# Patient Record
Sex: Female | Born: 1975 | Race: Black or African American | Hispanic: No | Marital: Single | Smoking: Former smoker
Health system: Southern US, Community
[De-identification: ages and names within clinical notes are randomized; demographics above are authoritative.]

## PROBLEM LIST (undated history)

## (undated) DIAGNOSIS — Z800 Family history of malignant neoplasm of digestive organs: Secondary | ICD-10-CM

## (undated) DIAGNOSIS — I10 Essential (primary) hypertension: Secondary | ICD-10-CM

## (undated) DIAGNOSIS — F3289 Other specified depressive episodes: Secondary | ICD-10-CM

## (undated) DIAGNOSIS — F419 Anxiety disorder, unspecified: Secondary | ICD-10-CM

## (undated) DIAGNOSIS — Z801 Family history of malignant neoplasm of trachea, bronchus and lung: Secondary | ICD-10-CM

## (undated) DIAGNOSIS — M1990 Unspecified osteoarthritis, unspecified site: Secondary | ICD-10-CM

## (undated) DIAGNOSIS — M542 Cervicalgia: Secondary | ICD-10-CM

## (undated) DIAGNOSIS — F329 Major depressive disorder, single episode, unspecified: Secondary | ICD-10-CM

## (undated) DIAGNOSIS — G43909 Migraine, unspecified, not intractable, without status migrainosus: Secondary | ICD-10-CM

## (undated) DIAGNOSIS — T7840XA Allergy, unspecified, initial encounter: Secondary | ICD-10-CM

## (undated) DIAGNOSIS — D649 Anemia, unspecified: Secondary | ICD-10-CM

## (undated) DIAGNOSIS — J45909 Unspecified asthma, uncomplicated: Secondary | ICD-10-CM

## (undated) DIAGNOSIS — N760 Acute vaginitis: Secondary | ICD-10-CM

## (undated) DIAGNOSIS — Z803 Family history of malignant neoplasm of breast: Secondary | ICD-10-CM

## (undated) DIAGNOSIS — O039 Complete or unspecified spontaneous abortion without complication: Secondary | ICD-10-CM

## (undated) HISTORY — DX: Allergy, unspecified, initial encounter: T78.40XA

## (undated) HISTORY — DX: Family history of malignant neoplasm of digestive organs: Z80.0

## (undated) HISTORY — DX: Complete or unspecified spontaneous abortion without complication: O03.9

## (undated) HISTORY — DX: Cervicalgia: M54.2

## (undated) HISTORY — DX: Acute vaginitis: N76.0

## (undated) HISTORY — DX: Migraine, unspecified, not intractable, without status migrainosus: G43.909

## (undated) HISTORY — DX: Unspecified osteoarthritis, unspecified site: M19.90

## (undated) HISTORY — DX: Family history of malignant neoplasm of breast: Z80.3

## (undated) HISTORY — DX: Family history of malignant neoplasm of trachea, bronchus and lung: Z80.1

## (undated) HISTORY — DX: Unspecified asthma, uncomplicated: J45.909

## (undated) HISTORY — DX: Major depressive disorder, single episode, unspecified: F32.9

## (undated) HISTORY — DX: Other specified depressive episodes: F32.89

---

## 1997-05-01 HISTORY — PX: OTHER SURGICAL HISTORY: SHX169

## 2001-02-19 ENCOUNTER — Emergency Department (HOSPITAL_COMMUNITY): Admission: EM | Admit: 2001-02-19 | Discharge: 2001-02-19 | Payer: Self-pay | Admitting: Emergency Medicine

## 2001-05-03 ENCOUNTER — Emergency Department (HOSPITAL_COMMUNITY): Admission: EM | Admit: 2001-05-03 | Discharge: 2001-05-03 | Payer: Self-pay | Admitting: Emergency Medicine

## 2002-03-31 ENCOUNTER — Emergency Department (HOSPITAL_COMMUNITY): Admission: EM | Admit: 2002-03-31 | Discharge: 2002-03-31 | Payer: Self-pay | Admitting: Emergency Medicine

## 2002-05-13 ENCOUNTER — Emergency Department (HOSPITAL_COMMUNITY): Admission: EM | Admit: 2002-05-13 | Discharge: 2002-05-13 | Payer: Self-pay | Admitting: Emergency Medicine

## 2002-07-29 ENCOUNTER — Emergency Department (HOSPITAL_COMMUNITY): Admission: EM | Admit: 2002-07-29 | Discharge: 2002-07-29 | Payer: Self-pay | Admitting: Emergency Medicine

## 2002-11-10 ENCOUNTER — Emergency Department (HOSPITAL_COMMUNITY): Admission: EM | Admit: 2002-11-10 | Discharge: 2002-11-10 | Payer: Self-pay | Admitting: *Deleted

## 2002-12-17 ENCOUNTER — Emergency Department (HOSPITAL_COMMUNITY): Admission: EM | Admit: 2002-12-17 | Discharge: 2002-12-17 | Payer: Self-pay

## 2003-01-09 ENCOUNTER — Emergency Department (HOSPITAL_COMMUNITY): Admission: EM | Admit: 2003-01-09 | Discharge: 2003-01-09 | Payer: Self-pay | Admitting: Emergency Medicine

## 2003-02-24 ENCOUNTER — Emergency Department (HOSPITAL_COMMUNITY): Admission: EM | Admit: 2003-02-24 | Discharge: 2003-02-24 | Payer: Self-pay | Admitting: Emergency Medicine

## 2003-06-20 ENCOUNTER — Emergency Department (HOSPITAL_COMMUNITY): Admission: EM | Admit: 2003-06-20 | Discharge: 2003-06-20 | Payer: Self-pay | Admitting: Emergency Medicine

## 2003-12-09 ENCOUNTER — Emergency Department (HOSPITAL_COMMUNITY): Admission: EM | Admit: 2003-12-09 | Discharge: 2003-12-09 | Payer: Self-pay | Admitting: Emergency Medicine

## 2003-12-10 ENCOUNTER — Ambulatory Visit (HOSPITAL_COMMUNITY): Admission: RE | Admit: 2003-12-10 | Discharge: 2003-12-10 | Payer: Self-pay | Admitting: Family Medicine

## 2004-10-25 ENCOUNTER — Emergency Department (HOSPITAL_COMMUNITY): Admission: EM | Admit: 2004-10-25 | Discharge: 2004-10-25 | Payer: Self-pay | Admitting: Emergency Medicine

## 2005-04-04 ENCOUNTER — Ambulatory Visit: Payer: Self-pay | Admitting: Family Medicine

## 2005-04-04 ENCOUNTER — Encounter (INDEPENDENT_AMBULATORY_CARE_PROVIDER_SITE_OTHER): Payer: Self-pay | Admitting: *Deleted

## 2005-04-04 LAB — CONVERTED CEMR LAB: Pap Smear: ABNORMAL

## 2006-01-03 ENCOUNTER — Emergency Department (HOSPITAL_COMMUNITY): Admission: EM | Admit: 2006-01-03 | Discharge: 2006-01-03 | Payer: Self-pay | Admitting: Emergency Medicine

## 2006-02-05 ENCOUNTER — Emergency Department (HOSPITAL_COMMUNITY): Admission: EM | Admit: 2006-02-05 | Discharge: 2006-02-05 | Payer: Self-pay | Admitting: Emergency Medicine

## 2006-04-17 ENCOUNTER — Emergency Department (HOSPITAL_COMMUNITY): Admission: EM | Admit: 2006-04-17 | Discharge: 2006-04-18 | Payer: Self-pay | Admitting: Emergency Medicine

## 2006-08-30 ENCOUNTER — Ambulatory Visit: Payer: Self-pay | Admitting: Family Medicine

## 2006-08-31 ENCOUNTER — Ambulatory Visit (HOSPITAL_COMMUNITY): Admission: RE | Admit: 2006-08-31 | Discharge: 2006-08-31 | Payer: Self-pay | Admitting: Family Medicine

## 2006-09-27 ENCOUNTER — Other Ambulatory Visit: Admission: RE | Admit: 2006-09-27 | Discharge: 2006-09-27 | Payer: Self-pay | Admitting: Family Medicine

## 2006-09-27 ENCOUNTER — Ambulatory Visit: Payer: Self-pay | Admitting: Family Medicine

## 2006-09-27 ENCOUNTER — Encounter (INDEPENDENT_AMBULATORY_CARE_PROVIDER_SITE_OTHER): Payer: Self-pay | Admitting: *Deleted

## 2006-09-28 ENCOUNTER — Encounter: Payer: Self-pay | Admitting: Family Medicine

## 2006-09-28 LAB — CONVERTED CEMR LAB
Candida species: NEGATIVE
Chlamydia, DNA Probe: NEGATIVE
GC Probe Amp, Genital: NEGATIVE
Gardnerella vaginalis: POSITIVE — AB
Trichomonal Vaginitis: NEGATIVE

## 2006-11-19 ENCOUNTER — Ambulatory Visit: Payer: Self-pay | Admitting: Family Medicine

## 2006-11-23 ENCOUNTER — Ambulatory Visit (HOSPITAL_COMMUNITY): Admission: RE | Admit: 2006-11-23 | Discharge: 2006-11-23 | Payer: Self-pay | Admitting: Family Medicine

## 2006-11-27 ENCOUNTER — Encounter (HOSPITAL_COMMUNITY): Admission: RE | Admit: 2006-11-27 | Discharge: 2006-12-27 | Payer: Self-pay | Admitting: Family Medicine

## 2006-11-27 ENCOUNTER — Emergency Department (HOSPITAL_COMMUNITY): Admission: EM | Admit: 2006-11-27 | Discharge: 2006-11-27 | Payer: Self-pay | Admitting: Emergency Medicine

## 2006-12-03 ENCOUNTER — Ambulatory Visit: Payer: Self-pay | Admitting: Orthopedic Surgery

## 2006-12-24 ENCOUNTER — Ambulatory Visit: Payer: Self-pay | Admitting: Orthopedic Surgery

## 2007-01-03 ENCOUNTER — Ambulatory Visit: Payer: Self-pay | Admitting: Orthopedic Surgery

## 2007-01-17 ENCOUNTER — Ambulatory Visit: Payer: Self-pay | Admitting: Orthopedic Surgery

## 2007-03-06 ENCOUNTER — Ambulatory Visit: Payer: Self-pay | Admitting: Family Medicine

## 2007-03-07 ENCOUNTER — Encounter: Payer: Self-pay | Admitting: Family Medicine

## 2007-03-08 ENCOUNTER — Encounter: Payer: Self-pay | Admitting: Family Medicine

## 2007-03-08 LAB — CONVERTED CEMR LAB: Gardnerella vaginalis: POSITIVE — AB

## 2007-05-02 DIAGNOSIS — O039 Complete or unspecified spontaneous abortion without complication: Secondary | ICD-10-CM

## 2007-05-02 HISTORY — DX: Complete or unspecified spontaneous abortion without complication: O03.9

## 2007-06-05 ENCOUNTER — Ambulatory Visit: Payer: Self-pay | Admitting: Family Medicine

## 2007-06-05 LAB — CONVERTED CEMR LAB
BUN: 11 mg/dL (ref 6–23)
Basophils Relative: 1 % (ref 0–1)
CO2: 23 meq/L (ref 19–32)
Chloride: 107 meq/L (ref 96–112)
Creatinine, Ser: 0.75 mg/dL (ref 0.40–1.20)
GC Probe Amp, Genital: NEGATIVE
Hemoglobin: 9.3 g/dL — ABNORMAL LOW (ref 12.0–15.0)
LDL Cholesterol: 116 mg/dL — ABNORMAL HIGH (ref 0–99)
Lymphocytes Relative: 27 % (ref 12–46)
Lymphs Abs: 1.3 10*3/uL (ref 0.7–4.0)
Monocytes Absolute: 0.4 10*3/uL (ref 0.1–1.0)
Monocytes Relative: 8 % (ref 3–12)
Neutro Abs: 2.9 10*3/uL (ref 1.7–7.7)
Neutrophils Relative %: 62 % (ref 43–77)
Potassium: 4.6 meq/L (ref 3.5–5.3)
RBC: 4.49 M/uL (ref 3.87–5.11)
TSH: 2.879 microintl units/mL (ref 0.350–5.50)
Triglycerides: 58 mg/dL (ref <150)
VLDL: 12 mg/dL (ref 0–40)
WBC: 4.8 10*3/uL (ref 4.0–10.5)

## 2007-06-06 ENCOUNTER — Encounter: Payer: Self-pay | Admitting: Family Medicine

## 2007-06-06 LAB — CONVERTED CEMR LAB
Candida species: NEGATIVE
Gardnerella vaginalis: POSITIVE — AB
Retic Ct Pct: 1.3 % (ref 0.4–3.1)
Trichomonal Vaginitis: NEGATIVE

## 2007-07-04 ENCOUNTER — Encounter (INDEPENDENT_AMBULATORY_CARE_PROVIDER_SITE_OTHER): Payer: Self-pay | Admitting: *Deleted

## 2007-07-04 DIAGNOSIS — F329 Major depressive disorder, single episode, unspecified: Secondary | ICD-10-CM | POA: Insufficient documentation

## 2007-07-04 DIAGNOSIS — M542 Cervicalgia: Secondary | ICD-10-CM | POA: Insufficient documentation

## 2007-07-10 ENCOUNTER — Ambulatory Visit: Payer: Self-pay | Admitting: Family Medicine

## 2007-07-11 ENCOUNTER — Encounter: Payer: Self-pay | Admitting: Family Medicine

## 2007-07-11 LAB — CONVERTED CEMR LAB: Candida species: NEGATIVE

## 2007-08-30 ENCOUNTER — Ambulatory Visit: Payer: Self-pay | Admitting: Family Medicine

## 2007-09-02 ENCOUNTER — Encounter: Payer: Self-pay | Admitting: Family Medicine

## 2007-09-02 LAB — CONVERTED CEMR LAB
Chlamydia, DNA Probe: NEGATIVE
GC Probe Amp, Genital: NEGATIVE

## 2007-09-03 ENCOUNTER — Encounter: Payer: Self-pay | Admitting: Family Medicine

## 2007-09-03 LAB — CONVERTED CEMR LAB
Gardnerella vaginalis: POSITIVE — AB
Trichomonal Vaginitis: NEGATIVE

## 2007-11-07 ENCOUNTER — Ambulatory Visit: Payer: Self-pay | Admitting: Family Medicine

## 2007-11-08 ENCOUNTER — Ambulatory Visit (HOSPITAL_COMMUNITY): Admission: RE | Admit: 2007-11-08 | Discharge: 2007-11-08 | Payer: Self-pay | Admitting: *Deleted

## 2007-11-08 ENCOUNTER — Encounter: Payer: Self-pay | Admitting: Family Medicine

## 2007-11-08 LAB — CONVERTED CEMR LAB: Trichomonal Vaginitis: NEGATIVE

## 2007-11-18 ENCOUNTER — Ambulatory Visit: Payer: Self-pay | Admitting: Cardiology

## 2007-11-27 ENCOUNTER — Ambulatory Visit (HOSPITAL_COMMUNITY): Admission: RE | Admit: 2007-11-27 | Discharge: 2007-11-27 | Payer: Self-pay | Admitting: *Deleted

## 2007-12-04 ENCOUNTER — Emergency Department (HOSPITAL_COMMUNITY): Admission: EM | Admit: 2007-12-04 | Discharge: 2007-12-04 | Payer: Self-pay | Admitting: Emergency Medicine

## 2007-12-19 ENCOUNTER — Encounter: Payer: Self-pay | Admitting: Family Medicine

## 2008-01-24 ENCOUNTER — Telehealth: Payer: Self-pay | Admitting: Family Medicine

## 2008-02-12 ENCOUNTER — Ambulatory Visit: Payer: Self-pay | Admitting: Family Medicine

## 2008-02-12 DIAGNOSIS — N912 Amenorrhea, unspecified: Secondary | ICD-10-CM | POA: Insufficient documentation

## 2008-02-12 DIAGNOSIS — N3000 Acute cystitis without hematuria: Secondary | ICD-10-CM | POA: Insufficient documentation

## 2008-02-12 DIAGNOSIS — N760 Acute vaginitis: Secondary | ICD-10-CM | POA: Insufficient documentation

## 2008-02-12 LAB — CONVERTED CEMR LAB
Beta hcg, urine, semiquantitative: NEGATIVE
Bilirubin Urine: NEGATIVE
Glucose, Urine, Semiquant: NEGATIVE
Protein, U semiquant: NEGATIVE
Specific Gravity, Urine: 1.015

## 2008-02-13 ENCOUNTER — Encounter: Payer: Self-pay | Admitting: Family Medicine

## 2008-02-13 ENCOUNTER — Ambulatory Visit (HOSPITAL_COMMUNITY): Admission: RE | Admit: 2008-02-13 | Discharge: 2008-02-13 | Payer: Self-pay | Admitting: Family Medicine

## 2008-02-13 LAB — CONVERTED CEMR LAB: Gardnerella vaginalis: POSITIVE — AB

## 2008-02-15 DIAGNOSIS — M79609 Pain in unspecified limb: Secondary | ICD-10-CM | POA: Insufficient documentation

## 2008-02-18 ENCOUNTER — Telehealth: Payer: Self-pay | Admitting: Family Medicine

## 2008-02-20 ENCOUNTER — Telehealth: Payer: Self-pay | Admitting: Family Medicine

## 2008-04-02 ENCOUNTER — Ambulatory Visit: Payer: Self-pay | Admitting: Family Medicine

## 2008-04-02 ENCOUNTER — Other Ambulatory Visit: Admission: RE | Admit: 2008-04-02 | Discharge: 2008-04-02 | Payer: Self-pay | Admitting: Family Medicine

## 2008-04-02 ENCOUNTER — Encounter: Payer: Self-pay | Admitting: Family Medicine

## 2008-04-03 ENCOUNTER — Encounter: Payer: Self-pay | Admitting: Family Medicine

## 2008-04-03 LAB — CONVERTED CEMR LAB
Chlamydia, DNA Probe: NEGATIVE
GC Probe Amp, Genital: NEGATIVE

## 2008-04-06 ENCOUNTER — Encounter: Payer: Self-pay | Admitting: Family Medicine

## 2008-04-07 LAB — CONVERTED CEMR LAB
Candida species: NEGATIVE
Retic Ct Pct: 1.7 % (ref 0.4–3.1)
Trichomonal Vaginitis: NEGATIVE

## 2008-04-13 ENCOUNTER — Telehealth: Payer: Self-pay | Admitting: Family Medicine

## 2008-04-13 ENCOUNTER — Encounter (HOSPITAL_COMMUNITY): Admission: RE | Admit: 2008-04-13 | Discharge: 2008-04-30 | Payer: Self-pay | Admitting: Family Medicine

## 2008-05-05 ENCOUNTER — Encounter (HOSPITAL_COMMUNITY): Admission: RE | Admit: 2008-05-05 | Discharge: 2008-06-04 | Payer: Self-pay | Admitting: Family Medicine

## 2008-05-12 ENCOUNTER — Encounter: Payer: Self-pay | Admitting: Family Medicine

## 2008-06-17 ENCOUNTER — Ambulatory Visit: Payer: Self-pay | Admitting: Family Medicine

## 2008-06-18 ENCOUNTER — Ambulatory Visit (HOSPITAL_COMMUNITY): Admission: RE | Admit: 2008-06-18 | Discharge: 2008-06-18 | Payer: Self-pay | Admitting: Family Medicine

## 2008-07-07 ENCOUNTER — Ambulatory Visit (HOSPITAL_COMMUNITY): Payer: Self-pay | Admitting: Psychiatry

## 2008-07-10 ENCOUNTER — Encounter: Payer: Self-pay | Admitting: Family Medicine

## 2008-08-31 ENCOUNTER — Ambulatory Visit: Payer: Self-pay | Admitting: Family Medicine

## 2008-08-31 DIAGNOSIS — R109 Unspecified abdominal pain: Secondary | ICD-10-CM | POA: Insufficient documentation

## 2008-08-31 DIAGNOSIS — F319 Bipolar disorder, unspecified: Secondary | ICD-10-CM | POA: Insufficient documentation

## 2008-09-01 ENCOUNTER — Encounter: Payer: Self-pay | Admitting: Family Medicine

## 2008-09-01 LAB — CONVERTED CEMR LAB
Basophils Absolute: 0 10*3/uL (ref 0.0–0.1)
Basophils Relative: 0 % (ref 0–1)
Calcium: 9.4 mg/dL (ref 8.4–10.5)
Cholesterol: 175 mg/dL (ref 0–200)
HDL: 45 mg/dL (ref >39)
MCHC: 32.4 g/dL (ref 30.0–36.0)
Neutro Abs: 4.3 10*3/uL (ref 1.7–7.7)
Neutrophils Relative %: 68 % (ref 43–77)
Platelets: 175 10*3/uL (ref 150–400)
RDW: 18 % — ABNORMAL HIGH (ref 11.5–15.5)
Sodium: 139 meq/L (ref 135–145)
Total CHOL/HDL Ratio: 3.9

## 2008-09-02 LAB — CONVERTED CEMR LAB: Retic Ct Pct: 1.4 % (ref 0.4–3.1)

## 2008-09-04 ENCOUNTER — Ambulatory Visit (HOSPITAL_COMMUNITY): Admission: RE | Admit: 2008-09-04 | Discharge: 2008-09-04 | Payer: Self-pay | Admitting: Family Medicine

## 2008-09-22 ENCOUNTER — Telehealth: Payer: Self-pay | Admitting: Family Medicine

## 2008-10-15 ENCOUNTER — Ambulatory Visit: Payer: Self-pay | Admitting: Family Medicine

## 2008-10-17 ENCOUNTER — Encounter: Payer: Self-pay | Admitting: Family Medicine

## 2008-10-17 LAB — CONVERTED CEMR LAB: GC Probe Amp, Genital: NEGATIVE

## 2008-10-21 ENCOUNTER — Encounter: Payer: Self-pay | Admitting: Family Medicine

## 2008-10-21 ENCOUNTER — Telehealth: Payer: Self-pay | Admitting: Family Medicine

## 2008-10-21 LAB — CONVERTED CEMR LAB
Candida species: NEGATIVE
Trichomonal Vaginitis: NEGATIVE

## 2008-12-01 ENCOUNTER — Ambulatory Visit: Payer: Self-pay | Admitting: Family Medicine

## 2008-12-01 DIAGNOSIS — J309 Allergic rhinitis, unspecified: Secondary | ICD-10-CM | POA: Insufficient documentation

## 2008-12-02 ENCOUNTER — Encounter: Payer: Self-pay | Admitting: Family Medicine

## 2008-12-02 LAB — CONVERTED CEMR LAB
Chlamydia, DNA Probe: NEGATIVE
GC Probe Amp, Genital: NEGATIVE

## 2009-01-07 ENCOUNTER — Emergency Department (HOSPITAL_COMMUNITY): Admission: EM | Admit: 2009-01-07 | Discharge: 2009-01-07 | Payer: Self-pay | Admitting: Emergency Medicine

## 2009-03-10 ENCOUNTER — Ambulatory Visit: Payer: Self-pay | Admitting: Family Medicine

## 2009-03-10 DIAGNOSIS — IMO0002 Reserved for concepts with insufficient information to code with codable children: Secondary | ICD-10-CM | POA: Insufficient documentation

## 2009-03-11 ENCOUNTER — Encounter: Payer: Self-pay | Admitting: Family Medicine

## 2009-03-11 LAB — CONVERTED CEMR LAB
GC Probe Amp, Genital: NEGATIVE
Gardnerella vaginalis: POSITIVE — AB

## 2009-04-12 ENCOUNTER — Other Ambulatory Visit: Admission: RE | Admit: 2009-04-12 | Discharge: 2009-04-12 | Payer: Self-pay | Admitting: Family Medicine

## 2009-04-12 ENCOUNTER — Ambulatory Visit: Payer: Self-pay | Admitting: Family Medicine

## 2009-04-12 DIAGNOSIS — R519 Headache, unspecified: Secondary | ICD-10-CM | POA: Insufficient documentation

## 2009-04-12 DIAGNOSIS — R51 Headache: Secondary | ICD-10-CM | POA: Insufficient documentation

## 2009-04-13 ENCOUNTER — Encounter: Payer: Self-pay | Admitting: Family Medicine

## 2009-04-14 ENCOUNTER — Telehealth: Payer: Self-pay | Admitting: Family Medicine

## 2009-04-14 LAB — CONVERTED CEMR LAB
Candida species: NEGATIVE
Gardnerella vaginalis: POSITIVE — AB

## 2009-04-27 ENCOUNTER — Ambulatory Visit: Payer: Self-pay | Admitting: Family Medicine

## 2009-04-27 DIAGNOSIS — J0100 Acute maxillary sinusitis, unspecified: Secondary | ICD-10-CM | POA: Insufficient documentation

## 2009-04-28 ENCOUNTER — Ambulatory Visit (HOSPITAL_COMMUNITY): Admission: RE | Admit: 2009-04-28 | Discharge: 2009-04-28 | Payer: Self-pay | Admitting: Family Medicine

## 2009-05-01 DIAGNOSIS — J209 Acute bronchitis, unspecified: Secondary | ICD-10-CM | POA: Insufficient documentation

## 2009-05-06 ENCOUNTER — Telehealth: Payer: Self-pay | Admitting: Family Medicine

## 2009-05-18 ENCOUNTER — Ambulatory Visit (HOSPITAL_COMMUNITY): Payer: Self-pay | Admitting: Psychiatry

## 2009-05-25 ENCOUNTER — Emergency Department (HOSPITAL_COMMUNITY): Admission: EM | Admit: 2009-05-25 | Discharge: 2009-05-25 | Payer: Self-pay | Admitting: Emergency Medicine

## 2009-05-26 ENCOUNTER — Ambulatory Visit: Payer: Self-pay | Admitting: Family Medicine

## 2009-05-26 ENCOUNTER — Encounter (INDEPENDENT_AMBULATORY_CARE_PROVIDER_SITE_OTHER): Payer: Self-pay

## 2009-05-26 DIAGNOSIS — H6690 Otitis media, unspecified, unspecified ear: Secondary | ICD-10-CM | POA: Insufficient documentation

## 2009-05-26 DIAGNOSIS — H9209 Otalgia, unspecified ear: Secondary | ICD-10-CM | POA: Insufficient documentation

## 2009-05-26 DIAGNOSIS — L049 Acute lymphadenitis, unspecified: Secondary | ICD-10-CM | POA: Insufficient documentation

## 2009-06-15 ENCOUNTER — Ambulatory Visit (HOSPITAL_COMMUNITY): Payer: Self-pay | Admitting: Psychiatry

## 2009-06-16 ENCOUNTER — Telehealth: Payer: Self-pay | Admitting: Family Medicine

## 2009-08-11 ENCOUNTER — Ambulatory Visit: Payer: Self-pay | Admitting: Family Medicine

## 2009-08-11 DIAGNOSIS — N760 Acute vaginitis: Secondary | ICD-10-CM | POA: Insufficient documentation

## 2009-08-11 DIAGNOSIS — R5383 Other fatigue: Secondary | ICD-10-CM

## 2009-08-11 DIAGNOSIS — R5381 Other malaise: Secondary | ICD-10-CM | POA: Insufficient documentation

## 2009-08-11 DIAGNOSIS — R443 Hallucinations, unspecified: Secondary | ICD-10-CM | POA: Insufficient documentation

## 2009-08-12 ENCOUNTER — Encounter: Payer: Self-pay | Admitting: Family Medicine

## 2009-08-12 ENCOUNTER — Encounter: Payer: Self-pay | Admitting: Physician Assistant

## 2009-08-12 LAB — CONVERTED CEMR LAB: Candida species: NEGATIVE

## 2009-08-30 ENCOUNTER — Telehealth: Payer: Self-pay | Admitting: Family Medicine

## 2009-08-31 ENCOUNTER — Telehealth: Payer: Self-pay | Admitting: Family Medicine

## 2009-09-02 ENCOUNTER — Ambulatory Visit (HOSPITAL_COMMUNITY): Admission: RE | Admit: 2009-09-02 | Discharge: 2009-09-02 | Payer: Self-pay | Admitting: Family Medicine

## 2009-10-20 ENCOUNTER — Ambulatory Visit: Payer: Self-pay | Admitting: Family Medicine

## 2009-10-21 ENCOUNTER — Encounter: Payer: Self-pay | Admitting: Family Medicine

## 2009-10-21 LAB — CONVERTED CEMR LAB
Chlamydia, DNA Probe: NEGATIVE
GC Probe Amp, Genital: NEGATIVE

## 2009-10-22 LAB — CONVERTED CEMR LAB: Candida species: NEGATIVE

## 2009-10-29 ENCOUNTER — Telehealth (INDEPENDENT_AMBULATORY_CARE_PROVIDER_SITE_OTHER): Payer: Self-pay | Admitting: *Deleted

## 2010-03-03 ENCOUNTER — Ambulatory Visit: Payer: Self-pay | Admitting: Family Medicine

## 2010-03-03 DIAGNOSIS — N9489 Other specified conditions associated with female genital organs and menstrual cycle: Secondary | ICD-10-CM | POA: Insufficient documentation

## 2010-03-03 DIAGNOSIS — M25569 Pain in unspecified knee: Secondary | ICD-10-CM | POA: Insufficient documentation

## 2010-03-03 DIAGNOSIS — N920 Excessive and frequent menstruation with regular cycle: Secondary | ICD-10-CM | POA: Insufficient documentation

## 2010-03-07 ENCOUNTER — Telehealth: Payer: Self-pay | Admitting: Family Medicine

## 2010-03-07 ENCOUNTER — Telehealth (INDEPENDENT_AMBULATORY_CARE_PROVIDER_SITE_OTHER): Payer: Self-pay | Admitting: *Deleted

## 2010-03-07 ENCOUNTER — Ambulatory Visit (HOSPITAL_COMMUNITY): Admission: RE | Admit: 2010-03-07 | Discharge: 2010-03-07 | Payer: Self-pay | Admitting: Family Medicine

## 2010-03-07 DIAGNOSIS — D500 Iron deficiency anemia secondary to blood loss (chronic): Secondary | ICD-10-CM | POA: Insufficient documentation

## 2010-05-23 ENCOUNTER — Telehealth: Payer: Self-pay | Admitting: Family Medicine

## 2010-05-24 ENCOUNTER — Ambulatory Visit
Admission: RE | Admit: 2010-05-24 | Discharge: 2010-05-24 | Payer: Self-pay | Source: Home / Self Care | Attending: Family Medicine | Admitting: Family Medicine

## 2010-05-24 ENCOUNTER — Encounter: Payer: Self-pay | Admitting: Family Medicine

## 2010-05-24 ENCOUNTER — Emergency Department (HOSPITAL_COMMUNITY)
Admission: EM | Admit: 2010-05-24 | Discharge: 2010-05-24 | Payer: Self-pay | Source: Home / Self Care | Admitting: Emergency Medicine

## 2010-05-24 DIAGNOSIS — K089 Disorder of teeth and supporting structures, unspecified: Secondary | ICD-10-CM | POA: Insufficient documentation

## 2010-05-25 ENCOUNTER — Encounter: Payer: Self-pay | Admitting: Family Medicine

## 2010-05-26 ENCOUNTER — Encounter: Payer: Self-pay | Admitting: Family Medicine

## 2010-05-29 LAB — CONVERTED CEMR LAB
BUN: 9 mg/dL (ref 6–23)
Basophils Absolute: 0 10*3/uL (ref 0.0–0.1)
Basophils Relative: 1 % (ref 0–1)
CO2: 23 meq/L (ref 19–32)
Calcium: 9.4 mg/dL (ref 8.4–10.5)
Cholesterol: 177 mg/dL (ref 0–200)
Creatinine, Ser: 0.75 mg/dL (ref 0.40–1.20)
Eosinophils Absolute: 0.1 10*3/uL (ref 0.0–0.7)
Eosinophils Relative: 2 % (ref 0–5)
Glucose, Bld: 78 mg/dL (ref 70–99)
Hemoglobin: 8.8 g/dL — ABNORMAL LOW (ref 12.0–15.0)
MCHC: 29.6 g/dL — ABNORMAL LOW (ref 30.0–36.0)
MCV: 67.3 fL — ABNORMAL LOW (ref 78.0–100.0)
Monocytes Absolute: 0.3 10*3/uL (ref 0.1–1.0)
Monocytes Relative: 5 % (ref 3–12)
Neutro Abs: 4.2 10*3/uL (ref 1.7–7.7)
RDW: 18.5 % — ABNORMAL HIGH (ref 11.5–15.5)
TSH: 1.332 microintl units/mL (ref 0.350–4.500)
Vit D, 25-Hydroxy: 19 ng/mL — ABNORMAL LOW (ref 30–89)

## 2010-05-30 LAB — CONVERTED CEMR LAB
Candida species: POSITIVE — AB
Gardnerella vaginalis: POSITIVE — AB

## 2010-05-31 NOTE — Letter (Signed)
Summary: Out of Work  [facility]  [address]   [place], [place] [address]   Phone: [phone]  Fax: [phone]    May 26, 2009   Employee:  BRYN SALINE    To Whom It May Concern:   For Medical reasons, please excuse the above named employee from work for the following dates:  Start:   05/26/2009  End:   05/28/2009  If you need additional information, please feel free to contact our office.         Sincerely,   Milus Mallick. Lodema Hong, M.D.

## 2010-05-31 NOTE — Progress Notes (Signed)
Summary: Nov. 15 Dr Forestine Chute appt.  Phone Note Outgoing Call   Summary of Call: Rebecca Boyd has an appt. with [facility] on Nov. 15 at 11:20, I have faxed over ultrasound results and lab results as Dr Lodema Hong requested. Initial call taken by: Curtis Sites,  March 07, 2010 3:50 PM

## 2010-05-31 NOTE — Progress Notes (Signed)
Summary: Ct scan  Phone Note Call from Patient   Reason for Call: Talk to Nurse Summary of Call: needs for nurse to call her at new number. [phone] about doing a CT Scan.  pt stated another Doctor wanted her to have a CT done.  Initial call taken by: Rudene Anda,  June 16, 2009 3:42 PM  Follow-up for Phone Call        dr arfeen wants ct scan of brain - per patient having hallucinations Follow-up by: Adella Hare LPN,  June 16, 2009 3:47 PM  Additional Follow-up for Phone Call Additional follow up Details #1::        pls verify with Atrfeen's office what he wants, i think it's mRI brain no contrast, if so pls order whatever he wants scan of the brain, dx hallucination Additional Follow-up by: Syliva Overman MD,  June 16, 2009 5:06 PM    Additional Follow-up for Phone Call Additional follow up Details #2::    called Ruby and she left Dr Lolly Mustache a message on this. He is to call back with what we are to order for this pt Follow-up by: Rudene Anda,  June 17, 2009 10:03 AM  Additional Follow-up for Phone Call Additional follow up Details #3:: Details for Additional Follow-up Action Taken: called pt and let her know that office couldn't get it MRI precert due to not knowing all the information that was needed. pt is going to call Dr. Robert Bellow office and she if they will scheduale this for her. Since is requesting it. and they are the ones who have all the information that insurance is asking. pt aware and okay with this.  Additional Follow-up by: Rudene Anda,  June 22, 2009 12:09 PM

## 2010-05-31 NOTE — Progress Notes (Signed)
  Phone Note Call from Patient   Caller: Patient Summary of Call: wants ibuprofen sent in, states she discussed at ov with dr simpson Initial call taken by: Adella Hare LPN,  March 07, 2010 3:57 PM  Follow-up for Phone Call        pls stamp and send over script for IBUprofen which i have entered electroniacally after you spk with her. LET her UNDERSTAND , she cANNOT take this with the vimovo samplesshe was given, one or the other only Follow-up by: Syliva Overman MD,  March 07, 2010 8:22 PM  Additional Follow-up for Phone Call Additional follow up Details #1::        Phone Call Completed, Rx Called In Additional Follow-up by: Adella Hare LPN,  March 08, 2010 10:26 AM    New/Updated Medications: IBUPROFEN 800 MG TABS (IBUPROFEN) Take 1 tablet by mouth two times a day as needed uncontrolled pain Prescriptions: IBUPROFEN 800 MG TABS (IBUPROFEN) Take 1 tablet by mouth two times a day as needed uncontrolled pain  #40 x 1   Entered by:   Adella Hare LPN   Authorized by:   Syliva Overman MD   Signed by:   Adella Hare LPN on 09/81/1914   Method used:   Electronically to        [facility] (retail)       [address]       [place], [place]  [address]       Ph: [phone]       Fax: [phone]   RxID:   [id number] IBUPROFEN 800 MG TABS (IBUPROFEN) Take 1 tablet by mouth two times a day as needed uncontrolled pain  #40 x 1   Entered and Authorized by:   Syliva Overman MD   Signed by:   Syliva Overman MD on 03/07/2010   Method used:   Historical   RxID:   [id number]

## 2010-05-31 NOTE — Progress Notes (Signed)
Summary: CALL  Phone Note Call from Patient   Summary of Call: Renato Battles TO CALL HER AT [phone] Initial call taken by: Lind Guest,  Aug 30, 2009 2:05 PM  Follow-up for Phone Call        is there anything a little stronger than ibuprofen you can prescribe for headaches, ibuprofen not helping, supposed to have mri, hasnt heard anything yet Follow-up by: Adella Hare LPN,  Aug 31, 4538 3:26 PM  Additional Follow-up for Phone Call Additional follow up Details #1::        Rx Tramadol 50 mg #20 take 1 every 6 hrs as needed for HA . no rf. Additional Follow-up by: Esperanza Sheets PA,  Aug 30, 2009 4:00 PM    Additional Follow-up for Phone Call Additional follow up Details #2::    patient aware Follow-up by: Adella Hare LPN,  Aug 30, 9809 4:35 PM  New/Updated Medications: TRAMADOL HCL 50 MG TABS (TRAMADOL HCL) one tab by mouth every 6 hours for headache Prescriptions: TRAMADOL HCL 50 MG TABS (TRAMADOL HCL) one tab by mouth every 6 hours for headache  #20 x 0   Entered by:   Adella Hare LPN   Authorized by:   Syliva Overman MD   Signed by:   Adella Hare LPN on 91/47/8295   Method used:   Electronically to        [facility] (retail)       [address] [place], [place]  [address]       Ph: [phone]       Fax: [phone]   RxID:   [id number]

## 2010-05-31 NOTE — Progress Notes (Signed)
  Phone Note Call from Patient   Summary of Call: Needs PA on the Vimovo. Wasn't able to get at pharmacy the day you sent it in. PA or change? Initial call taken by: Everitt Amber LPN,  October 29, 1608 4:12 PM  Follow-up for Phone Call        will change to ibuprofen andfax in  Follow-up by: Syliva Overman MD,  November 02, 2009 8:43 AM  Additional Follow-up for Phone Call Additional follow up Details #1::        pls let pt know med has bween sent in Additional Follow-up by: Syliva Overman MD,  November 02, 2009 8:43 AM    Additional Follow-up for Phone Call Additional follow up Details #2::    spoke with patient. Follow-up by: Curtis Sites,  November 02, 2009 9:39 AM  New/Updated Medications: IBUPROFEN 800 MG TABS (IBUPROFEN) Take 1 tablet by mouth two times a day for 7 days , then one twice daily as needed Prescriptions: IBUPROFEN 800 MG TABS (IBUPROFEN) Take 1 tablet by mouth two times a day for 7 days , then one twice daily as needed  #40 x 1   Entered and Authorized by:   Syliva Overman MD   Signed by:   Syliva Overman MD on 11/02/2009   Method used:   Electronically to        [facility] (retail)       [address]       [place], [place]  [address]       Ph: [phone]       Fax: [phone]   RxID:   [id number]

## 2010-05-31 NOTE — Assessment & Plan Note (Signed)
Summary: OV   Vital Signs:  Patient profile:   35 year old female Menstrual status:  irregular Height:      61.5 inches Weight:      156 pounds BMI:     29.10 O2 Sat:      99 % Pulse rate:   91 / minute Pulse rhythm:   regular Resp:     16 per minute BP sitting:   110 / 80  Vitals Entered By: Everitt Amber (May 26, 2009 3:34 PM)  Nutrition Counseling: Patient's BMI is greater than 25 and therefore counseled on weight management options. CC: Patient c/o left ear ache. Went to ER yesterday and the doctor didn't do or give her anything for it   Primary Care Provider:  Syliva Overman MD  CC:  Patient c/o left ear ache. Went to ER yesterday and the doctor didn't do or give her anything for it.  History of Present Illness: Reports  thatshe had been doing well up until 1 day ago, when she developed severe left ear pain which took her to the ED Denies recent fever or chills. Denies sinus pressure, nasal congestion , or sore throat. Denies chest congestion, or cough productive of sputum. Denies chest pain, palpitations, PND, orthopnea or leg swelling. Denies abdominal pain, nausea, vomitting, diarrhea or constipation. Denies change in bowel movements or bloody stool. Denies dysuria , frequency, incontinence or hesitancy. Denies  joint pain, swelling, or reduced mobility. Denies headaches, vertigo, seizures. Reports improvementr in her depression and mood instability, and is being treated by psych. Denies  rash, lesions, or itch.     Current Medications (verified): 1)  Ortho-Cyclen (28) 0.25-35 Mg-Mcg Tabs (Norgestimate-Eth Estradiol) .... Use As Directed 2)  Flonase 50 Mcg/act Susp (Fluticasone Propionate) .Marland Kitchen.. 1 Puff Per Nostril Twice Daily 3)  Alprazolam 1 Mg Tabs (Alprazolam) .... Take 1 Tablet By Mouth Once A Day As Needed  Allergies (verified): 1)  ! Benadryl (Diphenhydramine Hcl)  Review of Systems ENT:  Complains of earache; left ear pain and pain on left side of  neck since yesterday, went top the ED was seen in the ed after leaving work, no meds prescribed.  Physical Exam  General:  Well-developed,well-nourished,in no acute distress; alert,appropriate and cooperative throughout examination HEENT: No facial asymmetry,  EOMI, No sinus tenderness, left TM dull with fluid behind the drum, oropharynx  pink and moist. Left cervical adenitis  Chest: Clear to auscultation bilaterally.  CVS: S1, S2, No murmurs, No S3.   Abd: Soft, Nontender.  MS: Adequate ROM spine, hips, shoulders and knees.  Ext: No edema.   CNS: CN 2-12 intact, power tone and sensation normal throughout.   Skin: Intact, no visible lesions or rashes.  Psych: Good eye contact, normal affect.  Memory intact, not anxious or depressed appearing.    Impression & Recommendations:  Problem # 1:  EAR PAIN, LEFT (ICD-388.70) Assessment Deteriorated  The following medications were removed from the medication list:    Metronidazole 500 Mg Tabs (Metronidazole) ..... One tab by mouth bid    Azithromycin 250 Mg Tabs (Azithromycin) .Marland Kitchen..Marland Kitchen Two tablets today , then one tablet daily for 4 days Her updated medication list for this problem includes:    Penicillin V Potassium 500 Mg Tabs (Penicillin v potassium) .Marland Kitchen... Take 1 tablet by mouth three times a day  Orders: Ketorolac-Toradol 15mg  (Z6109) Admin of Therapeutic Inj  intramuscular or subcutaneous ([address])  Problem # 2:  LOM (ICD-382.9) Assessment: Comment Only  The following medications were  removed from the medication list:    Ibuprofen 800 Mg Tabs (Ibuprofen) .Marland Kitchen... Take 1 tablet by mouth three times a day as needed    Metronidazole 500 Mg Tabs (Metronidazole) ..... One tab by mouth bid    Azithromycin 250 Mg Tabs (Azithromycin) .Marland Kitchen..Marland Kitchen Two tablets today , then one tablet daily for 4 days Her updated medication list for this problem includes:    Penicillin V Potassium 500 Mg Tabs (Penicillin v potassium) .Marland Kitchen... Take 1 tablet by mouth three  times a day    Ibuprofen 800 Mg Tabs (Ibuprofen) .Marland Kitchen... Take 1 tablet by mouth three times a day as needed  n  Problem # 3:  BIPOLAR DISORDER UNSPECIFIED (ICD-296.80) Assessment: Improved  Complete Medication List: 1)  Ortho-cyclen (28) 0.25-35 Mg-mcg Tabs (Norgestimate-eth estradiol) .... Use as directed 2)  Flonase 50 Mcg/act Susp (Fluticasone propionate) .Marland Kitchen.. 1 puff per nostril twice daily 3)  Alprazolam 1 Mg Tabs (Alprazolam) .... Take 1 tablet by mouth once a day as needed 4)  Penicillin V Potassium 500 Mg Tabs (Penicillin v potassium) .... Take 1 tablet by mouth three times a day 5)  Fluconazole 150 Mg Tabs (Fluconazole) .... Take 1 tablet by mouth once a day as needed 6)  Ibuprofen 800 Mg Tabs (Ibuprofen) .... Take 1 tablet by mouth three times a day as needed  n  Patient Instructions: 1)  f/u as before. 2)  youare being treated for left serous otitis and left cervicaladenitis,meds are sent to your pharmacy Prescriptions: IBUPROFEN 800 MG TABS (IBUPROFEN) Take 1 tablet by mouth three times a day as needed  n  #30 x 0   Entered by:   Everitt Amber   Authorized by:   Syliva Overman MD   Signed by:   Everitt Amber on 05/26/2009   Method used:   Electronically to        [facility] (retail)       [address] [place], [place]  [address]       Ph: [phone]       Fax: [phone]   RxID:   [id number] FLUCONAZOLE 150 MG TABS (FLUCONAZOLE) Take 1 tablet by mouth once a day as needed  #3 x 0   Entered by:   Everitt Amber   Authorized by:   Syliva Overman MD   Signed by:   Everitt Amber on 05/26/2009   Method used:   Electronically to        [facility] (retail)       [address] [place], [place]  [address]       Ph: [phone]       Fax: [phone]   RxID:   [id number] PENICILLIN V POTASSIUM 500 MG TABS (PENICILLIN V POTASSIUM) Take 1 tablet by mouth three times a day  #30 x 0    Entered by:   Everitt Amber   Authorized by:   Syliva Overman MD   Signed by:   Everitt Amber on 05/26/2009   Method used:   Electronically to        [facility] (retail)       [address] [place], [place]  [address]       Ph: [phone]  Fax: [phone]   RxID:   [id number]    Medication Administration  Injection # 1:    Medication: Ketorolac-Toradol 15mg     Diagnosis: EAR PAIN, LEFT (ICD-388.70)    Route: IM    Site: RUOQ gluteus    Exp Date: 11/2010    Lot #: [id number]    Mfr: [email or web address]    Comments: 60 mg given     Patient tolerated injection without complications    Given by: Everitt Amber (May 26, 2009 4:25 PM)  Orders Added: 1)  Est. Patient Level IV [30865] 2)  Ketorolac-Toradol 15mg  [J1885] 3)  Admin of Therapeutic Inj  intramuscular or subcutaneous [78469]

## 2010-05-31 NOTE — Progress Notes (Signed)
Summary: question on penicillin  Phone Note Call from Patient   Summary of Call: needs to speak with nurse about medicine. about penicillin. [phone] Initial call taken by: Rudene Anda,  May 06, 2009 10:26 AM  Follow-up for Phone Call        Got zpak and she thought doc said she was getting pcn. Told her that doc sent in zpak and to finish it and call back if she still felt bad. Advised she did recieve a pcn shot in the office and maybe thats why she thought she was getting that instead. Said she would finish and call back if no better. Follow-up by: Everitt Amber,  May 06, 2009 2:42 PM

## 2010-05-31 NOTE — Assessment & Plan Note (Signed)
Summary: office visit   Vital Signs:  Patient profile:   35 year old female Menstrual status:  irregular Height:      61.5 inches Weight:      157.75 pounds BMI:     29.43 O2 Sat:      99 % on Room air Pulse rate:   102 / minute Pulse rhythm:   regular Resp:     16 per minute BP sitting:   118 / 82  (left arm) Cuff size:   regular  Vitals Entered By: Everitt Amber LPN (August 11, 2009 1:02 PM)  Nutrition Counseling: Patient's BMI is greater than 25 and therefore counseled on weight management options.  O2 Flow:  Room air CC: has been having itching in the genital area and tingling. Wants to be checked for BV   Primary Care Provider:  Syliva Overman MD  CC:  has been having itching in the genital area and tingling. Wants to be checked for BV.  History of Present Illness: Reports  that she is doing fairly well. Denies sinus pressure, nasal congestion , ear pain or sore throat. Denies chest congestion, or cough productive of sputum. Denies chest pain, palpitations, PND, orthopnea or leg swelling. Denies abdominal pain, nausea, vomitting, diarrhea or constipation. Denies change in bowel movements or bloody stool. Denies dysuria , frequency, incontinence or hesitancy. reports stingingin vaginal area and increased d/c Denies  joint pain, swelling, or reduced mobility.  Denies  rash, lesions, or itch.     Current Medications (verified): 1)  Ortho-Cyclen (28) 0.25-35 Mg-Mcg Tabs (Norgestimate-Eth Estradiol) .... Use As Directed 2)  Flonase 50 Mcg/act Susp (Fluticasone Propionate) .Marland Kitchen.. 1 Puff Per Nostril Twice Daily 3)  Alprazolam 1 Mg Tabs (Alprazolam) .... Take 1 Tablet By Mouth Once A Day As Needed 4)  Ibuprofen 800 Mg Tabs (Ibuprofen) .... Take 1 Tablet By Mouth Three Times A Day As Needed  N  Allergies (verified): 1)  ! Benadryl (Diphenhydramine Hcl)  Review of Systems      See HPI Eyes:  Denies blurring and discharge. GU:  Complains of dysuria; itchy smelly d/c  with genital tingling since last week, used fluconazole las week no benefit, recently changed soaps. Neuro:  Complains of headaches; 3 day h/o left supraorbital throbbing, no nausea, photophobia , does havwe a migraine history.period just ended. Psych:  Complains of mental problems and unusual visions or sounds; denies suicidal thoughts/plans and thoughts of violence; has dhad hallucinations. Heme:  Denies abnormal bruising and bleeding. Allergy:  Denies hives or rash and itching eyes.   Physical Exam  General:  Well-developed,well-nourished,in no acute distress; alert,appropriate and cooperative throughout examination HEENT: No facial asymmetry,  EOMI, No sinus tenderness, TM's Clear, oropharynx  pink and moist.   Chest: Clear to auscultation bilaterally.  CVS: S1, S2, No murmurs, No S3.   Abd: Soft, Nontender.  MS: Adequate ROM spine, hips, shoulders and knees.  Ext: No edema.   CNS: CN 2-12 intact, power tone and sensation normal throughout.   Skin: Intact, no visible lesions or rashes.  Psych: Good eye contact, normal affect.  Memory intact, not anxious or depressed appearing. GU: cervix normal no cervical motion or adnexal tenderness. White vag d/c   Impression & Recommendations:  Problem # 1:  VAGINITIS (ICD-616.10) Assessment Comment Only  The following medications were removed from the medication list:    Penicillin V Potassium 500 Mg Tabs (Penicillin v potassium) .Marland Kitchen... Take 1 tablet by mouth three times a day Her updated medication  list for this problem includes:    Metronidazole 500 Mg Tabs (Metronidazole) ..... One tab by mouth two times a day  Orders: T-Chlamydia & GC Probe, Genital ([id number]/[id number]) will await on results before treating T-Wet Prep by Molecular Probe [phone])  Problem # 2:  SEXUALLY TRANSMITTED DISEASE, EXPOSURE TO (ICD-V01.6) Assessment: Comment Only  Orders: T-Herpes Simplex Type 2 ([id number])  Problem # 3:  HALLUCINATIONS  (ICD-780.1) Assessment: Comment Only  Orders: Radiology Referral (Radiology)  mRI brain to eval hallucinations anduncontrolled headaches  Problem # 4:  HEADACHE (ICD-784.0) Assessment: Deteriorated  Her updated medication list for this problem includes:    Ibuprofen 800 Mg Tabs (Ibuprofen) .Marland Kitchen... Take 1 tablet by mouth three times a day as needed  n  Orders: Radiology Referral (Radiology) Depo- Medrol 80mg  (J1040) Ketorolac-Toradol 15mg  (G9562) Admin of Therapeutic Inj  intramuscular or subcutaneous (13086)  Problem # 5:  BIPOLAR DISORDER UNSPECIFIED (ICD-296.80) Assessment: Improved treated by psych  Complete Medication List: 1)  Ortho-cyclen (28) 0.25-35 Mg-mcg Tabs (Norgestimate-eth estradiol) .... Use as directed 2)  Flonase 50 Mcg/act Susp (Fluticasone propionate) .Marland Kitchen.. 1 puff per nostril twice daily 3)  Alprazolam 1 Mg Tabs (Alprazolam) .... Take 1 tablet by mouth once a day as needed 4)  Ibuprofen 800 Mg Tabs (Ibuprofen) .... Take 1 tablet by mouth three times a day as needed  n 5)  Metronidazole 500 Mg Tabs (Metronidazole) .... One tab by mouth two times a day 6)  Acyclovir 200 Mg Caps (Acyclovir) .... One capsule 5 times daily, start 08/13/2009 7)  Acyclovir 400 Mg Tabs (Acyclovir) .... Take 1 tablet by mouth two times a day , start on completion of 10 day course  Other Orders: T-Basic Metabolic Panel [phone]) T-Lipid Profile [phone]) T-TSH [phone]) T-CBC w/Diff [phone]) T-Vitamin D (25-Hydroxy) [phone])  Patient Instructions: 1)  Please schedule a follow-up appointment in 4 months. 2)  It is important that you exercise regularly at least 20 minutes 5 times a week. If you develop chest pain, have severe difficulty breathing, or feel very tired , stop exercising immediately and seek medical attention. 3)  You need to lose weight. Consider a lower calorie diet and regular exercise.  4)  BMP prior to visit, ICD-9: 5)  Lipid Panel prior to visit,  ICD-9: 6)  TSH prior to visit, ICD-9:    fasting May 4 or after 7)  CBC w/ Diff prior to visit, ICD-9: 8)  Vit D level 9)  You will get injectionsfor your headache and an mRI will be ordered. 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       [place], [place]  [address]       Ph: [phone]       Fax: [phone]   RxID:   [id number] ACYCLOVIR 200 MG CAPS (ACYCLOVIR) one capsule 5 times daily, start 08/13/2009  #50 x 0   Entered and Authorized by:   Syliva Overman MD  Signed by:   Syliva Overman MD on 08/13/2009   Method used:   Electronically to        [facility] (retail)       [address]       [place], [place]  [address]       Ph: [phone]       Fax: [phone]   RxID:   [id number] IBUPROFEN 800 MG TABS (IBUPROFEN) Take 1 tablet by mouth three times a day as needed  n  #30 x 2   Entered by:   Everitt Amber LPN   Authorized by:   Syliva Overman MD   Signed by:   Everitt Amber LPN on 84/13/2440   Method used:   Printed then faxed to ...       [facility] (retail)       [address]       [place], [place]  [address]       Ph: [phone]       Fax: [phone]   RxID:   [id number] ALPRAZOLAM 1 MG TABS (ALPRAZOLAM) Take 1 tablet by mouth once a day as needed  #30 x 3   Entered by:   Everitt Amber LPN   Authorized by:   Syliva Overman MD   Signed by:   Everitt Amber LPN on 30/16/0109   Method used:   Printed then faxed to ...       [facility] (retail)       [address]       [place], [place]  [address]       Ph: [phone]       Fax: [phone]   RxID:   [id number]    Medication Administration  Injection # 1:    Medication: Depo- Medrol 80mg     Diagnosis: HEADACHE (ICD-784.0)    Route: IM    Site: RUOQ gluteus    Exp Date: 03/2010    Lot #: [id number]    Mfr: Pharmacia    Comments: 80mg  given     Patient tolerated injection without complications    Given by: Everitt Amber LPN (August 11, 2009 1:42 PM)  Injection # 2:    Medication: Ketorolac-Toradol 15mg     Diagnosis: HEADACHE (ICD-784.0)    Route: IM    Site: LUOQ gluteus    Exp Date: 04/2011    Lot #: [id number]     Mfr: novaplus    Comments: 60mg  g iven     Patient tolerated injection without complications    Given by: Everitt Amber LPN (August 11, 2009 1:43 PM)  Orders Added: 1)  Est. Patient Level IV [48546] 2)  Radiology Referral [Radiology] 3)  T-Herpes Simplex Type 2 [86696-81071] 4)  T-Basic Metabolic Panel [80048-22910] 5)  T-Lipid Profile [80061-22930] 6)  T-TSH [27035-00938] 7)  T-CBC w/Diff [18299-37169] 8)  T-Vitamin D (25-Hydroxy) [67893-81017] 9)  Depo- Medrol 80mg  [J1040] 10)  Ketorolac-Toradol 15mg  [J1885] 11)  Admin of Therapeutic Inj  intramuscular or subcutaneous [96372] 12)  T-Chlamydia & GC Probe, Genital [87491/[id number]] 13)  T-Wet Prep by Molecular Probe [[id number]]

## 2010-05-31 NOTE — Letter (Signed)
Summary: Out of Work  [facility]  [address]   [place], [place] [address]   Phone: [phone]  Fax: [phone]    May 26, 2009   Employee:  PAIZLEIGH WILDS    To Whom It May Concern:   For Medical reasons, please excuse the above named employee from work for the following dates:  Start:   05/26/2009    End:   05/28/2009 (Without Restrictions)  If you need additional information, please feel free to contact our office.         Sincerely,   Milus Mallick. Lodema Hong, M.D.

## 2010-05-31 NOTE — Progress Notes (Signed)
Summary: MRI  Phone Note Call from Patient   Summary of Call: pt has appt at [facility] for [facility] on 09/02/2009 7:40. pt aware and did need percert.  [phone] Initial call taken by: Rudene Anda,  Aug 31, 2009 10:14 AM

## 2010-05-31 NOTE — Assessment & Plan Note (Signed)
Summary: follow up/missed appt on 02/17/10--slj   Vital Signs:  Patient profile:   35 year old female Menstrual status:  irregular Height:      61.5 inches Weight:      151.50 pounds BMI:     28.26 O2 Sat:      98 % on Room air Pulse rate:   101 / minute Pulse rhythm:   regular Resp:     16 per minute BP sitting:   112 / 84  (left arm)  Vitals Entered By: Mauricia Area CMA (March 03, 2010 3:05 PM)  Nutrition Counseling: Patient's BMI is greater than 25 and therefore counseled on weight management options.  O2 Flow:  Room air CC: follow up. Slight headache, right knee pain   Primary Care Provider:  Syliva Overman MD  CC:  follow up. Slight headache and right knee pain.  History of Present Illness: Pt reports she is not doing very well. She has developed even heavier menstrual flow wit fatigue in the past 3 months. She continues to be mentally unstable and ha sa to set up an appt with psych tp have her needs addressed. She is not suicidalor homicidal. she c/o right knee pain and swelling acutely which is dbilitationg, and this is her prime reason for the visit today.  Current Medications (verified): 1)  Ortho-Cyclen (28) 0.25-35 Mg-Mcg Tabs (Norgestimate-Eth Estradiol) .... Use As Directed 2)  Flonase 50 Mcg/act Susp (Fluticasone Propionate) .Marland Kitchen.. 1 Puff Per Nostril Twice Daily 3)  Alprazolam 1 Mg Tabs (Alprazolam) .... Take 1 Tablet By Mouth Once A Day As Needed 4)  Ibuprofen 800 Mg Tabs (Ibuprofen) .... Take 1 Tablet By Mouth Three Times A Day As Needed  N 5)  Acyclovir 400 Mg Tabs (Acyclovir) .... Take 1 Tablet By Mouth Two Times A Day , Start On Completion of 10 Day Course 6)  Tramadol Hcl 50 Mg Tabs (Tramadol Hcl) .... One Tab By Mouth Every 6 Hours For Headache 7)  Proventil Hfa 108 (90 Base) Mcg/act Aers (Albuterol Sulfate) .... 2 Puffs Every 6 To 8 Hrs As Needed For Wheezing 8)  Metrogel 1 % Gel (Metronidazole) .... Insert One Applicator of Cream Intravaginally  Twice Daily For 5 Days 9)  Ibuprofen 800 Mg Tabs (Ibuprofen) .... Take 1 Tablet By Mouth Two Times A Day For 7 Days , Then One Twice Daily As Needed  Allergies (verified): 1)  ! Benadryl (Diphenhydramine Hcl)  Past History:  Past Medical History: Current Problems:  DEPRESSION (ICD-311) NECK PAIN (ICD-723.1) Vaginitis Cyctitis Miscarriage in 2009  Review of Systems      See HPI General:  Complains of fatigue. Eyes:  Denies blurring and discharge. ENT:  Denies hoarseness, nasal congestion, postnasal drainage, and sinus pressure. CV:  Denies chest pain or discomfort, palpitations, and swelling of feet. Resp:  Denies cough and sputum productive. GI:  Denies abdominal pain, constipation, diarrhea, nausea, and vomiting. GU:  Complains of abnormal vaginal bleeding; increased menses duration up to 8 days , flooding and clotting  worse in the past 3 mths, currently on oCP. MS:  Complains of joint pain and stiffness; right knee pain with tenderness medially and swelling x 1 week, no trauma first episode.. Neuro:  Complains of headaches; 30 min  temporal headache, no nausea. Psych:  Complains of anxiety, depression, irritability, and mental problems; denies suicidal thoughts/plans and thoughts of violence. Endo:  Complains of cold intolerance; denies excessive thirst, excessive urination, and heat intolerance. Heme:  Complains of abnormal bruising;  denies bleeding, enlarge lymph nodes, and fevers. Allergy:  Denies hives or rash and itching eyes.  Physical Exam  General:  Well-developed,well-nourished,in no acute distress; alert,appropriate and cooperative throughout examination HEENT: No facial asymmetry,  EOMI, No sinus tenderness, TM's Clear, oropharynx   moist. mucosa pale.  Chest: Clear to auscultation bilaterally.  CVS: S1, S2, No murmurs, No S3.   Abd: Soft, Nontender.  MS: Adequate ROM spine, hips, shoulders and  markedly reduced in right knee which is swollen and has crepitus.    Ext: No edema.   CNS: CN 2-12 intact, power tone and sensation normal throughout.   Skin: Intact, no visible lesions or rashes.  Psych: Good eye contact, normal affect.  Memory intact, not anxious or depressed appearing.    Impression & Recommendations:  Problem # 1:  KNEE PAIN, RIGHT (ICD-719.46) Assessment Deteriorated  The following medications were removed from the medication list:    Ibuprofen 800 Mg Tabs (Ibuprofen) .Marland Kitchen... Take 1 tablet by mouth three times a day as needed  n    Ibuprofen 800 Mg Tabs (Ibuprofen) .Marland Kitchen... Take 1 tablet by mouth two times a day for 7 days , then one twice daily as needed Her updated medication list for this problem includes:    Tramadol Hcl 50 Mg Tabs (Tramadol hcl) ..... One tab by mouth every 6 hours for headache    Vimovo 500-20 Mg Tbec (Naproxen-esomeprazole) .Marland Kitchen... 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 Take 1 capsule by mouth three times a day  Hgb: 8.8 (10/20/2009)   Hct: 29.7 (10/20/2009)   Platelets: 255 (10/20/2009) RBC: 4.41 (10/20/2009)   RDW: 18.5 (10/20/2009)   WBC: 6.1 (10/20/2009) MCV: 67.3 (10/20/2009)   MCHC: 29.6 (10/20/2009) Retic Ct: 66.0 K/uL (10/21/2009)   Ferritin: 7 (10/21/2009) Iron: 20 (10/21/2009)   TIBC: 437 (10/21/2009)   % Sat: 5 (10/21/2009) B12: 483 (10/21/2009)   Folate: >20.0 ng/mL (10/21/2009)   TSH: 1.332 (10/20/2009)  Problem # 3:  OVARIAN MASS (ICD-625.8) Assessment: Comment Only  Orders: Gynecologic Referral (Gyn) I explaine d to the pt the seriousness of this dx and the absolute neded to see gynae Radiology Referral (Radiology)  Problem # 4:  BIPOLAR DISORDER  UNSPECIFIED (ICD-296.80) Assessment: Unchanged plans to make appt with psych, money short at this time  Complete Medication List: 1)  Ortho-cyclen (28) 0.25-35 Mg-mcg Tabs (Norgestimate-eth estradiol) .... Use as directed 2)  Flonase 50 Mcg/act Susp (Fluticasone propionate) .Marland Kitchen.. 1 puff per nostril twice daily 3)  Alprazolam 1 Mg Tabs (Alprazolam) .... Take 1 tablet by mouth once a day as needed 4)  Acyclovir 400 Mg Tabs (Acyclovir) .... Take 1 tablet by mouth two times a day , start on completion of 10 day course 5)  Tramadol Hcl 50 Mg Tabs (Tramadol hcl) .... One tab by mouth every 6 hours for headache 6)  Proventil Hfa 108 (90 Base) Mcg/act Aers (Albuterol sulfate) .... 2 puffs every 6 to 8 hrs as needed for wheezing 7)  Metrogel 1 % Gel (Metronidazole) .... Insert one applicator of cream intravaginally twice daily for 5 days 8)  Prednisone (pak) 5 Mg Tabs (Prednisone) .... Use as directed 9)  Vimovo 500-20  Mg Tbec (Naproxen-esomeprazole) .... Take 1 tablet by mouth two times a day 10)  Nu-iron 150 Mg Caps (Polysaccharide iron complex) .... Take 1 capsule by mouth three times a day  Other Orders: T-CBC w/Diff ([id number]) T-Anemia Panel 3  (2904)  Patient Instructions: 1)  F/u  in 2.5 months 2)  It is important that you exercise regularly at least 20 minutes 5 times a week. If you develop chest pain, have severe difficulty breathing, or feel very tired , stop exercising immediately and seek medical attention. 3)  You need to lose weight. Consider a lower calorie diet and regular exercise.  4)  CBC w/ Diff prior to visit, ICD-9: and anemia apanel tioday. 5)  You need to get the date for your [place] preferrably before you lv, if not then you need to call back get the date for this  as well as the appt date for Dr Emelda Fear. THIS IS EXTREMELY impt. 6)  Injections today for headache and knee pain , call back next week if nio better.Meds sent in also Prescriptions: NU-IRON 150 MG CAPS  (POLYSACCHARIDE IRON COMPLEX) Take 1 capsule by mouth three times a day  #90 x 4   Entered and Authorized by:   Syliva Overman MD   Signed by:   Syliva Overman MD on 03/07/2010   Method used:   Historical   RxID:   [id number] VIMOVO 500-20 MG TBEC (NAPROXEN-ESOMEPRAZOLE) Take 1 tablet by mouth two times a day  #12 x 0   Entered and Authorized by:   Syliva Overman MD   Signed by:   Syliva Overman MD on 03/03/2010   Method used:   Samples Given   RxID:   [id number] PREDNISONE (PAK) 5 MG TABS (PREDNISONE) Use as directed  #210 x 0   Entered and Authorized by:   Syliva Overman MD   Signed by:   Syliva Overman MD on 03/03/2010   Method used:   Electronically to        [facility] (retail)       [address] [place], [place]  [address]       Ph: [phone]       Fax: [phone]   RxID:   [id number]    Medication Administration  Injection # 1:    Medication: Ketorolac-Toradol 15mg     Diagnosis: KNEE PAIN, RIGHT (ICD-719.46)    Route: IM    Site: RUOQ gluteus    Exp Date: 03/02/2011    Lot #: [id number]    Mfr: [email or web address]    Comments: 60 mg given    Patient tolerated injection without complications    Given by: Mauricia Area CMA (March 03, 2010 4:23 PM)  Injection # 2:    Medication: Depo- Medrol 80mg     Diagnosis: KNEE PAIN, RIGHT (ICD-719.46)    Route: IM    Site: LUOQ gluteus    Exp Date: 09/2010    Lot #: [id number]    Mfr: Pharmacia    Comments: 80 mg given    Patient tolerated injection without complications    Given by: Mauricia Area CMA (March 03, 2010 4:24 PM)  Orders Added: 1)  Est. Patient Level IV [32355] 2)  T-CBC w/Diff [[id number]] 3)  T-Anemia Panel 3  [2904] 4)  Ketorolac-Toradol 15mg  [J1885] 5)  Depo- Medrol 80mg  [J1040] 6)  Admin of Therapeutic Inj  intramuscular or subcutaneous [96372] 7)  Gynecologic  Referral [Gyn] 8)  Radiology Referral [Radiology]

## 2010-05-31 NOTE — Assessment & Plan Note (Signed)
Summary: OV   Vital Signs:  Patient profile:   35 year old female Menstrual status:  irregular Height:      61.5 inches Weight:      155 pounds BMI:     28.92 O2 Sat:      98 % Pulse rate:   88 / minute Pulse rhythm:   regular Resp:     16 per minute BP sitting:   118 / 82  (left arm) Cuff size:   regular  Vitals Entered By: Everitt Amber LPN (October 20, 2009 10:39 AM)  Nutrition Counseling: Patient's BMI is greater than 25 and therefore counseled on weight management options. CC: wants to be checked for BV or yeast and also has been having some sharp pains in her back off an d on and something has been tingling in her left ear   Primary Care Provider:  Syliva Overman MD  CC:  wants to be checked for BV or yeast and also has been having some sharp pains in her back off an d on and something has been tingling in her left ear.  History of Present Illness: Reports  that she has been doing fairly well. Denies recent fever or chills. Denies sinus pressure, nasal congestion , ear pain or sore throat. Denies chest congestion, or cough productive of sputum. Denies chest pain, palpitations, PND, orthopnea or leg swelling. Denies abdominal pain, nausea, vomitting, diarrhea or constipation. Denies change in bowel movements or bloody stool.  Denies  joint pain, swelling, or reduced mobility. Denies headaches, vertigo, seizures. Reports uncontrolled depression and mental health problems,. needs to see psych but "money low' at this time.she is not suicidalor homiocidal Denies  rash, lesions, or itch.     Allergies (verified): 1)  ! Benadryl (Diphenhydramine Hcl)  Review of Systems      See HPI Eyes:  Denies blurring and discharge. GU:  Denies discharge; fishy vaginal d/c x 5 days. MS:  intermittent muscle spasm left groin and right buttock. Endo:  Denies cold intolerance, excessive hunger, excessive thirst, excessive urination, heat intolerance, polyuria, and weight change. Heme:   Denies abnormal bruising and bleeding. Allergy:  Complains of seasonal allergies; denies hives or rash and itching eyes.  Physical Exam  General:  Well-developed,well-nourished,in no acute distress; alert,appropriate and cooperative throughout examination HEENT: No facial asymmetry,  EOMI, No sinus tenderness, TM's Clear, oropharynx  pink and moist.   Chest: Clear to auscultation bilaterally.  CVS: S1, S2, No murmurs, No S3.   Abd: Soft, Nontender.  MS: Adequate ROM spine, hips, shoulders and knees.  Ext: No edema.   CNS: CN 2-12 intact, power tone and sensation normal throughout.   Skin: Intact, no visible lesions or rashes.  Psych: Good eye contact, normal affect.  Memory intact, not anxious or depressed appearing. GU: cervix normal no cervical motion or adnexal tenderness. White vag d/c   Impression & Recommendations:  Problem # 1:  VAGINITIS (ICD-616.10) Assessment Comment Only  Her updated medication list for this problem includes:    Metronidazole 500 Mg Tabs (Metronidazole) ..... One tab by mouth two times a day    Metronidazole 500 Mg Tabs (Metronidazole) .Marland Kitchen... 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 Use as directed 2)  Flonase 50 Mcg/act Susp (Fluticasone propionate) .Marland Kitchen.. 1 puff per nostril twice daily 3)  Alprazolam 1 Mg Tabs (Alprazolam) .... Take 1 tablet by mouth once a day as needed 4)  Ibuprofen 800 Mg Tabs (Ibuprofen) .... Take 1 tablet by mouth three times a day as needed  n 5)   Metronidazole 500 Mg Tabs (Metronidazole) .... One tab by mouth two times a day 6)  Acyclovir 400 Mg Tabs (Acyclovir) .... Take 1 tablet by mouth two times a day , start on completion of 10 day course 7)  Tramadol Hcl 50 Mg Tabs (Tramadol hcl) .... One tab by mouth every 6 hours for headache 8)  Vimovo 500-20 Mg Tbec (Naproxen-esomeprazole) .... One tablet twice daily as needed 9)  Proventil Hfa 108 (90 Base) Mcg/act Aers (Albuterol sulfate) .... 2 puffs every 6 to 8 hrs as needed for wheezing 10)  Vitamin D (ergocalciferol) 50000 Unit Caps (Ergocalciferol) .... One capsule once weekly 11)  Metronidazole 500 Mg Tabs (Metronidazole) .... Take 1 tablet by mouth two times a day 12)  Metrogel 1 % Gel (Metronidazole) .... Insert one applicator of cream intravaginally twice daily for 5 days  Other Orders: T-Basic Metabolic Panel [phone]) T-Lipid Profile [phone]) T-CBC w/Diff [phone]) T-TSH [phone]) T-Vitamin D (25-Hydroxy) [phone]) Tdap => 92yrs IM ([address]) Admin 1st Vaccine (47425) Admin 1st Vaccine [facility]) [phone])  Patient Instructions: 1)  Please schedule a follow-up appointment in 4 months. 2)  It is important that you exercise regularly at least 20 minutes 5 times a week. If you develop chest pain, have severe difficulty breathing, or feel very tired , stop exercising immediately and seek medical attention. 3)  You need to lose weight. Consider a lower calorie diet and regular exercise.  4)  BMP prior to visit, ICD-9: 5)  Lipid Panel prior to visit, ICD-9: 6)  TSH prior to visit, ICD-9:  fasting today pls 7)  CBC w/ Diff prior to visit, ICD-9: 8)  vit d level 9)  You will get an injecton for back pain and meds are also sent in. 10)  you will be treated based on labwork Prescriptions: METROGEL 1 % GEL (METRONIDAZOLE) insert one applicator of cream intravaginally twice daily for 5 days  #70 gm x 0   Entered and Authorized by:   Syliva Overman MD   Signed by:    Syliva Overman MD on 10/21/2009   Method used:   Printed then faxed to ...       [facility] (retail)       [address]       [place], [place]  [address]       Ph: [phone]       Fax: [phone]   RxID:   [id number] METRONIDAZOLE 500 MG TABS (METRONIDAZOLE) Take 1 tablet by mouth two times a day  #14 x 0   Entered and Authorized by:   Syliva Overman MD   Signed by:   Syliva Overman MD on 10/21/2009   Method used:   Electronically to        [facility] (retail)       [address] [place], [place]  [address]       Ph: [phone]  Fax: [phone]   RxID:   [id number] VITAMIN D (ERGOCALCIFEROL) 50000 UNIT CAPS (ERGOCALCIFEROL) one capsule once weekly  #4 x 4   Entered and Authorized by:   Syliva Overman MD   Signed by:   Syliva Overman MD on 10/21/2009   Method used:   Electronically to        [facility] (retail)       [address]       [place], [place]  [address]       Ph: [phone]       Fax: [phone]   RxID:   [id number] PROVENTIL HFA 108 (90 BASE) MCG/ACT AERS (ALBUTEROL SULFATE) 2 puffs every 6 to 8 hrs as needed for wheezing  #1 x 3   Entered and Authorized by:   Syliva Overman MD   Signed by:   Syliva Overman MD on 10/20/2009   Method used:   Electronically to        [facility] (retail)       [address] [place], [place]  [address]       Ph: [phone]       Fax: [phone]   RxID:   [id number] VIMOVO 500-20 MG TBEC (NAPROXEN-ESOMEPRAZOLE) one tablet twice daily as needed  #60 x 0   Entered and Authorized by:   Syliva Overman MD   Signed by:   Syliva Overman MD on 10/20/2009   Method used:   Electronically to        [facility] (retail)       [address]       [place], [place]  [address]       Ph:  [phone]       Fax: [phone]   RxID:   [id number]    Tetanus/Td Vaccine    Vaccine Type: Tdap    Site: left deltoid    Mfr: [email or web address]    Dose: 0.5 ml    Route: IM    Given by: Everitt Amber LPN    Exp. Date: 07/24/2011    Lot #: [id number]     Medication Administration  Injection # 1:    Medication: Ketorolac-Toradol 15mg     Diagnosis: BACK PAIN WITH RADICULOPATHY (ICD-729.2)    Route: IM    Site: RUOQ gluteus    Exp Date: 06/2011    Lot #: [id number]    Mfr: hospira    Comments: 60mg  given     Patient tolerated injection without complications    Given by: Everitt Amber LPN (October 20, 2009 11:47 AM)  Orders Added: 1)  Est. Patient Level IV [99214] 2)  T-Basic Metabolic Panel [80048-22910] 3)  T-Lipid Profile [80061-22930] 4)  T-CBC w/Diff [00938-18299] 5)  T-TSH [37169-67893] 6)  T-Vitamin D (25-Hydroxy) [81017-51025] 7)  T-Wet Prep by Molecular Probe [85277-82423] 8)  T-Chlamydia & GC Probe, Genital [87491/[id number]] 9)  Tdap => 61yrs IM [90715] 10)  Admin 1st Vaccine [90471] 11)  Admin 1st Vaccine (State) [53614E] 12)  Ketorolac-Toradol 15mg  [J1885] 13)  Admin of Therapeutic Inj  intramuscular or subcutaneous [31540]

## 2010-05-31 NOTE — Letter (Signed)
Summary: Letter  Letter   Imported By: Lind Guest 08/12/2009 15:59:29  _____________________________________________________________________  External Attachment:    Type:   Image     Comment:   External Document

## 2010-06-02 NOTE — Letter (Signed)
Summary: MEDICINE  MEDICINE   Imported By: Lind Guest 05/25/2010 14:15:58  _____________________________________________________________________  External Attachment:    Type:   Image     Comment:   External Document

## 2010-06-02 NOTE — Letter (Signed)
Summary: Out of Work  [facility]  [address]   [place], [place] [address]   Phone: [phone]  Fax: [phone]    May 24, 2010   Employee:  Rebecca Boyd    To Whom It May Concern:   For Medical reasons, please excuse the above named employee from work for the following dates:  Start:   05/24/10  End:   05/26/10 to return with no restrictions  If you need additional information, please feel free to contact our office.         Sincerely,    Milus Mallick. Lodema Hong, MD

## 2010-06-02 NOTE — Progress Notes (Signed)
Summary: nurse  Phone Note Call from Patient   Summary of Call: pt has a sinus infection no appts and would like to get a rx for sinus infection  [phone] Initial call taken by: Rudene Anda,  May 23, 2010 10:38 AM  Follow-up for Phone Call        Went to [facility], she had an abscess from her wisdom tooth. Her dentist told her she had to wait until her sinuses were cleared up before she came. She is having clear sinus congestion. Advised saline washes and OTC sudafed. She still wants to come in to see doctor to possibly get antibiotic for her tooth. Luann to schedule Follow-up by: Everitt Amber LPN,  May 24, 2010 8:55 AM

## 2010-06-08 NOTE — Assessment & Plan Note (Signed)
Summary: OV   Vital Signs:  Patient profile:   35 year old female Menstrual status:  irregular Height:      61.5 inches Weight:      225 pounds O2 Sat:      99 % on Room air Pulse rate:   73 / minute Resp:     16 per minute BP sitting:   102 / 70  (left arm)  Vitals Entered By: Adella Hare LPN (May 24, 2010 3:39 PM)  O2 Flow:  Room air CC: ear ache or abcessed tooth, possible bv?   Primary Care Provider:  Syliva Overman MD  CC:  ear ache or abcessed tooth and possible bv?Marland Kitchen  History of Present Illness: Reports  that she was fairly well up until thwe past 1 week, when she developed dental pain and increased malodoros vag d/c Denies recent fever or chills. Denies sinus pressure, nasal congestion ,or sore throat. Denies chest congestion, or cough productive of sputum. Denies chest pain, palpitations, PND, orthopnea or leg swelling. Denies abdominal pain, nausea, vomitting, diarrhea or constipation. Denies change in bowel movements or bloody stool. Denies dysuria , frequency, incontinence or hesitancy. Denies  joint pain, swelling, or reduced mobility. Denies headaches, vertigo, seizures.  Denies  rash, lesions, or itch.     Allergies: 1)  ! Benadryl (Diphenhydramine Hcl)  Review of Systems      See HPI ENT:  Complains of earache; 5 day h/o right erearache. GU:  Complains of discharge. Psych:  Complains of anxiety, depression, and mental problems; denies suicidal thoughts/plans, thoughts of violence, unusual visions or sounds, and thoughts /plans of harming others. Endo:  Denies cold intolerance, excessive hunger, excessive thirst, and excessive urination. Heme:  Denies abnormal bruising and bleeding. Allergy:  Denies hives or rash.  Physical Exam  General:  Well-developed,well-nourished,in no acute distress; alert,appropriate and cooperative throughout examination HEENT: No facial asymmetry,  EOMI, No sinus tenderness, TM's Clear, oropharynx  pink and  moist.Impacted wisdom teeth  Chest: Clear to auscultation bilaterally.  CVS: S1, S2, No murmurs, No S3.   Abd: Soft, Nontender.  MS: Adequate ROM spine, hips, shoulders and knees.  Ext: No edema.   CNS: CN 2-12 intact, power tone and sensation normal throughout.   Skin: Intact, no visible lesions or rashes.  Psych: Good eye contact, normal affect.  Memory intact, not anxious or depressed appearing. Pelvic: fishy thick vag d/c , no cervical motio or adnexal tenderness   Impression & Recommendations:  Problem # 1:  DENTAL PAIN (ICD-525.9) Assessment Comment Only ibuprofen prescribed, and pt advised to [facility] asap  Problem # 2:  VAGINITIS (ICD-616.10) Assessment: Comment Only  Her updated medication list for this problem includes:    Clindamycin Hcl 300 Mg Caps (Clindamycin hcl) .Marland Kitchen... Take 1 capsule by mouth two times a day  Orders: T-Wet Prep by Molecular Probe [phone]) T-Chlamydia & GC Probe, Genital ([id number])  Problem # 3:  DEPRESSION (ICD-311) Assessment: Unchanged  Her updated medication list for this problem includes:    Alprazolam 1 Mg Tabs (Alprazolam) .Marland Kitchen... Take 1 tablet by mouth once a day as needed  Complete Medication List: 1)  Ortho-cyclen (28) 0.25-35 Mg-mcg Tabs (Norgestimate-eth estradiol) .... Use as directed 2)  Flonase 50 Mcg/act Susp (Fluticasone propionate) .Marland Kitchen.. 1 puff per nostril twice daily 3)  Alprazolam 1 Mg Tabs (Alprazolam) .... Take 1 tablet by mouth once a day as needed 4)  Acyclovir 400 Mg Tabs (Acyclovir) .... Take 1 tablet by mouth two times a day ,  start on completion of 10 day course 5)  Tramadol Hcl 50 Mg Tabs (Tramadol hcl) .... One tab by mouth every 6 hours for headache 6)  Proventil Hfa 108 (90 Base) Mcg/act Aers (Albuterol sulfate) .... 2 puffs every 6 to 8 hrs as needed for wheezing 7)  Metrogel 1 % Gel (Metronidazole) .... Insert one applicator of cream intravaginally twice daily for 5 days 8)  Nu-iron 150 Mg Caps  (Polysaccharide iron complex) .... Take 1 capsule by mouth three times a day 9)  Ibuprofen 800 Mg Tabs (Ibuprofen) .... Take 1 tablet by mouth two times a day as needed uncontrolled pain 10)  Fluconazole 150 Mg Tabs (Fluconazole) .... Take 1 tablet by mouth once a day as needed for vaginal itch 11)  Clindamycin Hcl 300 Mg Caps (Clindamycin hcl) .... Take 1 capsule by mouth two times a day Prescriptions: CLINDAMYCIN HCL 300 MG CAPS (CLINDAMYCIN HCL) Take 1 capsule by mouth two times a day  #14 x 0   Entered and Authorized by:   Syliva Overman MD   Signed by:   Syliva Overman MD on 05/29/2010   Method used:   Historical   RxID:   [id number] FLUCONAZOLE 150 MG TABS (FLUCONAZOLE) Take 1 tablet by mouth once a day as needed for vaginal itch  #3 x 0   Entered and Authorized by:   Syliva Overman MD   Signed by:   Syliva Overman MD on 05/29/2010   Method used:   Historical   RxID:   [id number] IBUPROFEN 800 MG TABS (IBUPROFEN) Take 1 tablet by mouth two times a day as needed uncontrolled pain  #40 x 2   Entered by:   Adella Hare LPN   Authorized by:   Syliva Overman MD   Signed by:   Adella Hare LPN on 84/69/6295   Method used:   Electronically to        [facility]* (retail)       [address] [place], [place]  [address]       Ph: [phone]       Fax: [phone]   RxID:   [id number] ACYCLOVIR 400 MG TABS (ACYCLOVIR) Take 1 tablet by mouth two times a day , start on completion of 10 day course  #60 x 3   Entered by:   Adella Hare LPN   Authorized by:   Syliva Overman MD   Signed by:   Adella Hare LPN on 18/84/1660   Method used:   Printed then faxed to ...       [facility]* (retail)       [address]       [place], [place]  [address]       Ph: [phone]       Fax: [phone]   RxID:   [id number] ALPRAZOLAM 1 MG TABS (ALPRAZOLAM) Take 1 tablet by mouth once a day  as needed  #30 x 3   Entered by:   Adella Hare LPN   Authorized by:   Syliva Overman MD   Signed by:   Adella Hare LPN on 73/71/0626   Method used:   Printed then faxed to ...       [facility] (retail)       [address]  [place], [place]  [address]       Ph: [phone]       Fax: [phone]   RxID:   [id number]    Orders Added: 1)  T-Wet Prep by Molecular Probe [phone] 2)  T-Chlamydia & GC Probe, Genital [[id number]] 3)  Est. Patient Level IV [[address]]

## 2010-06-16 ENCOUNTER — Ambulatory Visit: Payer: Self-pay | Admitting: Family Medicine

## 2010-07-06 ENCOUNTER — Telehealth: Payer: Self-pay | Admitting: Family Medicine

## 2010-07-12 NOTE — Progress Notes (Signed)
Summary: medicine  Phone Note Call from Patient   Summary of Call: acyzlovir nedds to be sent to [facility]ot Initial call taken by: Lind Guest,  July 06, 2010 11:26 AM    Prescriptions: ACYCLOVIR 400 MG TABS (ACYCLOVIR) Take 1 tablet by mouth two times a day , start on completion of 10 day course  #60 x 1   Entered by:   Adella Hare LPN   Authorized by:   Syliva Overman MD   Signed by:   Adella Hare LPN on 04/54/0981   Method used:   Electronically to        [facility] (retail)       [address] [place], [place]  [address]       Ph: [phone]       Fax: [phone]   RxID:   [id number]

## 2010-07-17 LAB — POCT CARDIAC MARKERS
CKMB, poc: <1 ng/mL — ABNORMAL LOW (ref 1.0–8.0)
Myoglobin, poc: 34.7 ng/mL (ref 12–200)

## 2010-07-28 ENCOUNTER — Telehealth: Payer: Self-pay | Admitting: Family Medicine

## 2010-07-29 NOTE — Telephone Encounter (Signed)
Done

## 2010-08-19 ENCOUNTER — Encounter: Payer: Self-pay | Admitting: Family Medicine

## 2010-08-30 ENCOUNTER — Telehealth: Payer: Self-pay | Admitting: Family Medicine

## 2010-08-30 NOTE — Telephone Encounter (Signed)
Patient requesting refills on metrondazole, advised patient needs ov

## 2010-10-06 ENCOUNTER — Encounter: Payer: Self-pay | Admitting: Family Medicine

## 2010-10-18 ENCOUNTER — Other Ambulatory Visit: Payer: Self-pay | Admitting: Family Medicine

## 2010-10-18 MED ORDER — ACYCLOVIR 400 MG PO TABS: 400.0000 mg | ORAL_TABLET | ORAL | Status: DC

## 2010-10-18 NOTE — Telephone Encounter (Signed)
Med sent as requested 

## 2010-10-25 ENCOUNTER — Encounter: Payer: Self-pay | Admitting: Family Medicine

## 2010-10-26 ENCOUNTER — Encounter: Payer: Self-pay | Admitting: Family Medicine

## 2010-10-26 ENCOUNTER — Other Ambulatory Visit (HOSPITAL_COMMUNITY)
Admission: RE | Admit: 2010-10-26 | Discharge: 2010-10-26 | Disposition: A | Discharge status: Home / Self Care | Payer: BC Managed Care – PPO | Source: Ambulatory Visit | Attending: Family Medicine | Admitting: Family Medicine

## 2010-10-26 ENCOUNTER — Ambulatory Visit (INDEPENDENT_AMBULATORY_CARE_PROVIDER_SITE_OTHER): Payer: BC Managed Care – PPO | Admitting: Family Medicine

## 2010-10-26 VITALS — BP 120/80 | HR 91 | Resp 16 | Ht 60.0 in | Wt 144.1 lb

## 2010-10-26 DIAGNOSIS — Z124 Encounter for screening for malignant neoplasm of cervix: Secondary | ICD-10-CM

## 2010-10-26 DIAGNOSIS — Z01419 Encounter for gynecological examination (general) (routine) without abnormal findings: Secondary | ICD-10-CM | POA: Insufficient documentation

## 2010-10-26 DIAGNOSIS — D500 Iron deficiency anemia secondary to blood loss (chronic): Secondary | ICD-10-CM

## 2010-10-26 DIAGNOSIS — Z0000 Encounter for general adult medical examination without abnormal findings: Secondary | ICD-10-CM

## 2010-10-26 DIAGNOSIS — T148XXA Other injury of unspecified body region, initial encounter: Secondary | ICD-10-CM

## 2010-10-26 DIAGNOSIS — M79605 Pain in left leg: Secondary | ICD-10-CM

## 2010-10-26 DIAGNOSIS — M542 Cervicalgia: Secondary | ICD-10-CM

## 2010-10-26 DIAGNOSIS — E559 Vitamin D deficiency, unspecified: Secondary | ICD-10-CM

## 2010-10-26 DIAGNOSIS — F419 Anxiety disorder, unspecified: Secondary | ICD-10-CM | POA: Insufficient documentation

## 2010-10-26 DIAGNOSIS — R5381 Other malaise: Secondary | ICD-10-CM

## 2010-10-26 DIAGNOSIS — Z13220 Encounter for screening for lipoid disorders: Secondary | ICD-10-CM

## 2010-10-26 DIAGNOSIS — M79604 Pain in right leg: Secondary | ICD-10-CM | POA: Insufficient documentation

## 2010-10-26 DIAGNOSIS — M79609 Pain in unspecified limb: Secondary | ICD-10-CM

## 2010-10-26 DIAGNOSIS — N760 Acute vaginitis: Secondary | ICD-10-CM

## 2010-10-26 DIAGNOSIS — F411 Generalized anxiety disorder: Secondary | ICD-10-CM

## 2010-10-26 DIAGNOSIS — Z139 Encounter for screening, unspecified: Secondary | ICD-10-CM

## 2010-10-26 DIAGNOSIS — F319 Bipolar disorder, unspecified: Secondary | ICD-10-CM

## 2010-10-26 DIAGNOSIS — N771 Vaginitis, vulvitis and vulvovaginitis in diseases classified elsewhere: Secondary | ICD-10-CM

## 2010-10-26 LAB — IRON AND TIBC: UIBC: >450 ug/dL

## 2010-10-26 LAB — LIPID PANEL: Cholesterol: 156 mg/dL (ref 0–200)

## 2010-10-26 LAB — FERRITIN: Ferritin: 7 ng/mL — ABNORMAL LOW (ref 10–291)

## 2010-10-26 LAB — BASIC METABOLIC PANEL
BUN: 9 mg/dL (ref 6–23)
Creat: 0.76 mg/dL (ref 0.50–1.10)

## 2010-10-26 LAB — TSH: TSH: 2.015 u[IU]/mL (ref 0.350–4.500)

## 2010-10-26 LAB — FOLATE: Folate: 6.1 ng/mL

## 2010-10-26 LAB — VITAMIN D 25 HYDROXY (VIT D DEFICIENCY, FRACTURES): Vit D, 25-Hydroxy: 18 ng/mL — ABNORMAL LOW (ref 30–89)

## 2010-10-26 MED ORDER — ALPRAZOLAM 1 MG PO TABS: 1.0000 mg | ORAL_TABLET | Freq: Every evening | ORAL | Status: DC | PRN

## 2010-10-26 MED ORDER — IBUPROFEN 800 MG PO TABS: 800.0000 mg | ORAL_TABLET | Freq: Two times a day (BID) | ORAL | Status: DC

## 2010-10-26 MED ORDER — IBUPROFEN 800 MG PO TABS: 800.0000 mg | ORAL_TABLET | Freq: Three times a day (TID) | ORAL | Status: AC | PRN

## 2010-10-26 NOTE — Patient Instructions (Signed)
F/u in 4 months.  congrats on weight loss , keep it up  Fasting labs today, cbc, chem 7, lipid, tsh, vit d, anemia panel

## 2010-10-27 DIAGNOSIS — E559 Vitamin D deficiency, unspecified: Secondary | ICD-10-CM | POA: Insufficient documentation

## 2010-10-27 LAB — CBC WITH DIFFERENTIAL/PLATELET
Basophils Absolute: 0 10*3/uL (ref 0.0–0.1)
Eosinophils Absolute: 0.2 10*3/uL (ref 0.0–0.7)
Lymphocytes Relative: 20 % (ref 12–46)
Lymphs Abs: 1.2 10*3/uL (ref 0.7–4.0)
MCH: 18.9 pg — ABNORMAL LOW (ref 26.0–34.0)
Neutrophils Relative %: 71 % (ref 43–77)
Platelets: 164 10*3/uL (ref 150–400)
RBC: 4.28 MIL/uL (ref 3.87–5.11)
RDW: 19.2 % — ABNORMAL HIGH (ref 11.5–15.5)
WBC: 5.9 10*3/uL (ref 4.0–10.5)

## 2010-10-27 LAB — GC/CHLAMYDIA PROBE AMP, GENITAL: GC Probe Amp, Genital: NEGATIVE

## 2010-10-27 LAB — APTT: aPTT: 28 seconds (ref 24–37)

## 2010-10-27 LAB — WET PREP BY MOLECULAR PROBE: Candida species: NEGATIVE

## 2010-10-27 MED ORDER — METRONIDAZOLE 500 MG PO TABS: 500.0000 mg | ORAL_TABLET | Freq: Two times a day (BID) | ORAL | Status: AC

## 2010-10-27 MED ORDER — VITAMIN D3 1.25 MG (50000 UT) PO CAPS: 1.0000 | ORAL_CAPSULE | ORAL | Status: DC

## 2010-10-27 NOTE — Assessment & Plan Note (Signed)
Recurrent bV, advised against douching and med sent in, also specimen

## 2010-10-27 NOTE — Assessment & Plan Note (Signed)
Non compliant with med in the past , I explained the need to take for at least 6 months, med sent in

## 2010-10-30 NOTE — Assessment & Plan Note (Signed)
Needs to rake supplemental iron

## 2010-10-30 NOTE — Assessment & Plan Note (Signed)
Improved, denies hallucinations, does not wish to resume antipsychotics, or to see psych at this time

## 2010-10-30 NOTE — Assessment & Plan Note (Signed)
Improved, pt to be maintained  on xanax scheduled

## 2010-10-30 NOTE — Progress Notes (Signed)
  Subjective:    Patient ID: Rebecca Boyd, female    DOB: Aug 30, 1975, 35 y.o.   MRN: [id number]  HPI The PT is here forannual exam  and re-evaluation of chronic medical conditions, medication management and review of recent lab and radiology data.  Preventive health is updated, specifically  Cancer screening,  and Immunization.   Questions or concerns regarding consultations or procedures which the PT has had in the interim are  addressed. The PT denies any adverse reactions to current medications since the last visit.  There are no new concerns.  There are no specific complaints, except that she wants testing of a malodorous vaginal d/c . She does tend to have recurrent BV . She also c/o intermittent bruising and wants this checked      Review of Systems Denies recent fever or chills. Denies sinus pressure, nasal congestion, ear pain or sore throat. Denies chest congestion, productive cough or wheezing. Denies chest pains, palpitations, paroxysmal nocturnal dyspnea, orthopnea and leg swelling Denies abdominal pain, nausea, vomiting,diarrhea or constipation.  Denies rectal bleeding or change in bowel movement. Denies dysuria, frequency, hesitancy or incontinence. Denies joint pain, swelling and limitation in mobility. Denies headaches, seizure, numbness, or tingling. Denies depression, anxiety or insomnia. Denies skin break down or rash.       Objective:   Physical Exam Pleasant well nourished female, alert and oriented x 3, in no cardio-pulmonary distress. Afebrile. HEENT No facial trauma or asymetry.   EOMI, PERTL, fundoscopic exam is normal, no hemorhage or exudate. No papiledema External ears normal, tympanic membranes clear. Oropharynx moist, no exudate, good dentition. Neck: supple, no adenopathy,JVD or thyromegaly.No bruits.  Chest: Clear to ascultation bilaterally.No crackles or wheezes. Non tender to palpation  Breast: No asymetry,no masses. No nipple  discharge or inversion. No axillary or supraclavicular adenopathy  Cardiovascular system; Heart sounds normal,  S1 and  S2 ,no S3.  No murmur, or thrill. Apical beat not displaced Peripheral pulses normal.bruise on right inner thigh   Abdomen: Soft, non tender, no organomegaly or masses. No bruits. Bowel sounds normal. No guarding, tenderness or rebound.  Rectal:  No mass. guaic negative stool.  GU: External genitalia normal. No lesions. Vaginal canal normal.fishy  discharge. Uterus normal size, no adnexal masses, no cervical motion or adnexal tenderness.  Musculoskeletal exam: Full ROM of spine, hips , shoulders and knees. No deformity ,swelling or crepitus noted. No muscle wasting or atrophy.   Neurologic: Cranial nerves 2 to 12 intact. Power, tone ,sensation and reflexes normal throughout. No disturbance in gait. No tremor.  Skin: Intact, no ulceration, erythema , scaling or rash noted. Pigmentation normal throughout  Psych; Normal mood and affect. Judgement and concentration normal        Assessment & Plan:

## 2010-10-30 NOTE — Assessment & Plan Note (Signed)
Chronic pain syndrome, uses ibuprofen as needed

## 2010-10-31 ENCOUNTER — Other Ambulatory Visit: Payer: Self-pay | Admitting: Family Medicine

## 2010-10-31 ENCOUNTER — Telehealth: Payer: Self-pay | Admitting: Family Medicine

## 2010-10-31 DIAGNOSIS — N760 Acute vaginitis: Secondary | ICD-10-CM

## 2010-10-31 DIAGNOSIS — B9689 Other specified bacterial agents as the cause of diseases classified elsewhere: Secondary | ICD-10-CM

## 2010-10-31 MED ORDER — METRONIDAZOLE 0.75 % VA GEL: 1.0000 | Freq: Two times a day (BID) | VAGINAL | Status: AC

## 2010-10-31 NOTE — Telephone Encounter (Signed)
Spoke with patient.

## 2010-11-01 MED ORDER — METRONIDAZOLE 500 MG PO TABS: ORAL_TABLET | ORAL | Status: DC

## 2010-11-01 NOTE — Progress Notes (Signed)
Addended by: Adella Hare B on: 11/01/2010 10:50 AM   Modules accepted: Orders

## 2010-11-24 ENCOUNTER — Encounter: Payer: Self-pay | Admitting: Family Medicine

## 2010-11-24 ENCOUNTER — Emergency Department (HOSPITAL_COMMUNITY)
Admission: EM | Admit: 2010-11-24 | Discharge: 2010-11-24 | Disposition: A | Discharge status: Home / Self Care | Payer: BC Managed Care – PPO | Attending: Emergency Medicine | Admitting: Emergency Medicine

## 2010-11-24 ENCOUNTER — Encounter (HOSPITAL_COMMUNITY): Payer: Self-pay

## 2010-11-24 DIAGNOSIS — F17200 Nicotine dependence, unspecified, uncomplicated: Secondary | ICD-10-CM | POA: Insufficient documentation

## 2010-11-24 DIAGNOSIS — R5381 Other malaise: Secondary | ICD-10-CM | POA: Insufficient documentation

## 2010-11-24 DIAGNOSIS — K219 Gastro-esophageal reflux disease without esophagitis: Secondary | ICD-10-CM

## 2010-11-24 DIAGNOSIS — F3289 Other specified depressive episodes: Secondary | ICD-10-CM | POA: Insufficient documentation

## 2010-11-24 DIAGNOSIS — F329 Major depressive disorder, single episode, unspecified: Secondary | ICD-10-CM | POA: Insufficient documentation

## 2010-11-24 DIAGNOSIS — R079 Chest pain, unspecified: Secondary | ICD-10-CM | POA: Insufficient documentation

## 2010-11-24 DIAGNOSIS — D649 Anemia, unspecified: Secondary | ICD-10-CM

## 2010-11-24 DIAGNOSIS — I498 Other specified cardiac arrhythmias: Secondary | ICD-10-CM | POA: Insufficient documentation

## 2010-11-24 DIAGNOSIS — R110 Nausea: Secondary | ICD-10-CM | POA: Insufficient documentation

## 2010-11-24 HISTORY — DX: Anemia, unspecified: D64.9

## 2010-11-24 LAB — BASIC METABOLIC PANEL
BUN: 9 mg/dL (ref 6–23)
Chloride: 104 mEq/L (ref 96–112)
GFR calc Af Amer: >60 mL/min (ref >60)
Potassium: 3.7 mEq/L (ref 3.5–5.1)
Sodium: 138 mEq/L (ref 135–145)

## 2010-11-24 LAB — CBC
HCT: 25.8 % — ABNORMAL LOW (ref 36.0–46.0)
MCV: 63.7 fL — ABNORMAL LOW (ref 78.0–100.0)
Platelets: 150 10*3/uL (ref 150–400)
RBC: 4.05 MIL/uL (ref 3.87–5.11)
WBC: 5.4 10*3/uL (ref 4.0–10.5)

## 2010-11-24 LAB — URINALYSIS, ROUTINE W REFLEX MICROSCOPIC
Bilirubin Urine: NEGATIVE
Hgb urine dipstick: NEGATIVE
Specific Gravity, Urine: >1.03 — ABNORMAL HIGH (ref 1.005–1.030)
pH: 5.5 (ref 5.0–8.0)

## 2010-11-24 LAB — DIFFERENTIAL
Eosinophils Relative: 2 % (ref 0–5)
Lymphocytes Relative: 30 % (ref 12–46)
Lymphs Abs: 1.6 10*3/uL (ref 0.7–4.0)
Monocytes Relative: 5 % (ref 3–12)
Neutro Abs: 3.4 10*3/uL (ref 1.7–7.7)

## 2010-11-24 LAB — POCT PREGNANCY, URINE: Preg Test, Ur: NEGATIVE

## 2010-11-24 MED ORDER — FERROUS SULFATE 325 (65 FE) MG PO TABS: 325.0000 mg | ORAL_TABLET | Freq: Every day | ORAL | Status: DC

## 2010-11-24 MED ORDER — SODIUM CHLORIDE 0.9 % IV BOLUS (SEPSIS)
1000.0000 mL | Freq: Once | INTRAVENOUS | Status: AC
  Administered 2010-11-24: 1000 mL via INTRAVENOUS

## 2010-11-24 MED ORDER — FAMOTIDINE 20 MG PO TABS: 20.0000 mg | ORAL_TABLET | Freq: Two times a day (BID) | ORAL | Status: DC

## 2010-11-24 NOTE — ED Notes (Signed)
Pt presents with c/o generalized weakness and burning in center of chest since yesterday.

## 2010-11-25 ENCOUNTER — Telehealth: Payer: Self-pay | Admitting: Family Medicine

## 2010-11-25 NOTE — Telephone Encounter (Signed)
Patient aware.

## 2010-11-25 NOTE — Telephone Encounter (Signed)
Patient went to er and and was told her was dehydrated and her blood count was low. Stated she needs iron. Advised she can get this OTC. Do you suggest anything else?

## 2010-11-25 NOTE — Telephone Encounter (Signed)
Iron 325 mg one twice daily this is otc.let her know

## 2010-11-25 NOTE — ED Provider Notes (Signed)
History     Chief Complaint  Patient presents with  . Weakness  . Heartburn   Patient is a 35 y.o. female presenting with chest pain. The history is provided by the patient.  Chest Pain The chest pain began yesterday. Chest pain occurs constantly. The chest pain is unchanged. Associated with: generalized weakness. At its most intense, the pain is at 5/10. The pain is currently at 5/10. The severity of the pain is moderate. The quality of the pain is described as burning. The pain radiates to the epigastrium. Exacerbated by: nothing. Primary symptoms include fatigue and nausea. Pertinent negatives for primary symptoms include no shortness of breath, no wheezing, no palpitations, no vomiting and no dizziness.  Associated symptoms include weakness.  Pertinent negatives for associated symptoms include no diaphoresis, no lower extremity edema and no near-syncope. She tried nothing for the symptoms. Risk factors include no known risk factors.     Past Medical History  Diagnosis Date  . Depressive disorder, not elsewhere classified   . Neck pain   . Vaginitis     cyctitis  . Miscarriage 2009  . Anemia     History reviewed. No pertinent past surgical history.  Family History  Problem Relation Age of Onset  . Drug abuse Mother   . Hypertension Father     History  Substance Use Topics  . Smoking status: Current Some Day Smoker    Types: Cigarettes  . Smokeless tobacco: Not on file  . Alcohol Use: No    OB History    Grav Para Term Preterm Abortions TAB SAB Ect Mult Living                  Review of Systems  Constitutional: Positive for fatigue. Negative for diaphoresis.  Respiratory: Negative for shortness of breath and wheezing.   Cardiovascular: Positive for chest pain. Negative for palpitations and near-syncope.  Gastrointestinal: Positive for nausea. Negative for vomiting.  Neurological: Positive for weakness. Negative for dizziness.  All other systems reviewed and are  negative.    Physical Exam  BP 120/79  Pulse 92  Temp(Src) 98.2 F (36.8 C) (Oral)  Resp 20  Ht 5' (1.524 m)  Wt 145 lb (65.772 kg)  BMI 28.32 kg/m2  SpO2 100%  LMP 11/02/2010  Physical Exam  Vitals reviewed. Constitutional: She is oriented to person, place, and time. She appears well-developed and well-nourished.  HENT:  Head: Normocephalic and atraumatic.  Eyes: Conjunctivae are normal.  Neck: Normal range of motion.  Cardiovascular: Normal rate, regular rhythm, normal heart sounds and intact distal pulses.   Pulmonary/Chest: Effort normal and breath sounds normal. She has no wheezes. She has no rales. She exhibits no tenderness.  Abdominal: Soft. Bowel sounds are normal. There is no tenderness.  Musculoskeletal: Normal range of motion.  Neurological: She is alert and oriented to person, place, and time.  Skin: Skin is warm and dry.  Psychiatric: She has a normal mood and affect.    ED Course  Procedures  MDM Labs reviewed,  pepcid given with some relief of sx.  Given NS 1 liter IV - patient felt better at discharge.    Date: 11/25/2010  Rate: 70  Rhythm: sinus arrhythmia  QRS Axis: normal  Intervals: normal  ST/T Wave abnormalities: normal  Conduction Disutrbances:none  Narrative Interpretation:   Old EKG Reviewed: unchanged  Results for orders placed during the hospital encounter of 11/24/10  URINALYSIS, ROUTINE W REFLEX MICROSCOPIC      Component Value  Range   Color, Urine YELLOW  YELLOW    Appearance CLEAR  CLEAR    Specific Gravity, Urine >1.030 (*) 1.005 - 1.030    pH 5.5  5.0 - 8.0    Glucose, UA NEGATIVE  NEGATIVE (mg/dL)   Hgb urine dipstick NEGATIVE  NEGATIVE    Bilirubin Urine NEGATIVE  NEGATIVE    Ketones, ur NEGATIVE  NEGATIVE (mg/dL)   Protein, ur NEGATIVE  NEGATIVE (mg/dL)   Urobilinogen, UA 0.2  0.0 - 1.0 (mg/dL)   Nitrite NEGATIVE  NEGATIVE    Leukocytes, UA NEGATIVE  NEGATIVE   PREGNANCY, URINE      Component Value Range   Preg  Test, Ur NEGATIVE    CBC      Component Value Range   WBC 5.4  4.0 - 10.5 (K/uL)   RBC 4.05  3.87 - 5.11 (MIL/uL)   Hemoglobin 7.7 (*) 12.0 - 15.0 (g/dL)   HCT 78.2 (*) 95.6 - 46.0 (%)   MCV 63.7 (*) 78.0 - 100.0 (fL)   MCH 19.0 (*) 26.0 - 34.0 (pg)   MCHC 29.8 (*) 30.0 - 36.0 (g/dL)   RDW 21.3 (*) 08.6 - 15.5 (%)   Platelets 150  150 - 400 (K/uL)  DIFFERENTIAL      Component Value Range   Neutrophils Relative 63  43 - 77 (%)   Lymphocytes Relative 30  12 - 46 (%)   Monocytes Relative 5  3 - 12 (%)   Eosinophils Relative 2  0 - 5 (%)   Basophils Relative 0  0 - 1 (%)   Neutro Abs 3.4  1.7 - 7.7 (K/uL)   Lymphs Abs 1.6  0.7 - 4.0 (K/uL)   Monocytes Absolute 0.3  0.1 - 1.0 (K/uL)   Eosinophils Absolute 0.1  0.0 - 0.7 (K/uL)   Basophils Absolute 0.0  0.0 - 0.1 (K/uL)   RBC Morphology POLYCHROMASIA PRESENT     Smear Review LARGE PLATELETS PRESENT    BASIC METABOLIC PANEL      Component Value Range   Sodium 138  135 - 145 (mEq/L)   Potassium 3.7  3.5 - 5.1 (mEq/L)   Chloride 104  96 - 112 (mEq/L)   CO2 20  19 - 32 (mEq/L)   Glucose, Bld 90  70 - 99 (mg/dL)   BUN 9  6 - 23 (mg/dL)   Creatinine, Ser 5.78  0.50 - 1.10 (mg/dL)   Calcium 9.1  8.4 - 46.9 (mg/dL)   GFR calc non Af Amer >60  >60 (mL/min)   GFR calc Af Amer >60  >60 (mL/min)  POCT PREGNANCY, URINE      Component Value Range   Preg Test, Ur NEGATIVE     No results found.      Candis Musa, PA 12/02/10 1623

## 2010-11-29 ENCOUNTER — Encounter: Payer: Self-pay | Admitting: Family Medicine

## 2010-12-05 NOTE — ED Provider Notes (Signed)
Medical screening examination/treatment/procedure(s) were performed by non-physician practitioner and as supervising physician I was immediately available for consultation/collaboration.   Ioan Landini M Ezra Marquess, DO 12/05/10 1723 

## 2011-01-27 LAB — DIFFERENTIAL
Basophils Absolute: 0
Eosinophils Absolute: 0.1
Lymphocytes Relative: 14
Monocytes Relative: 6
Neutrophils Relative %: 79 — ABNORMAL HIGH

## 2011-01-27 LAB — URINALYSIS, ROUTINE W REFLEX MICROSCOPIC
Glucose, UA: NEGATIVE
Ketones, ur: NEGATIVE
Nitrite: NEGATIVE
Protein, ur: NEGATIVE
Urobilinogen, UA: 0.2

## 2011-01-27 LAB — CBC
Platelets: 194
RDW: 19.7 — ABNORMAL HIGH
WBC: 8

## 2011-01-27 LAB — HCG, QUANTITATIVE, PREGNANCY: hCG, Beta Chain, Quant, S: 70 — ABNORMAL HIGH

## 2011-01-27 LAB — PREGNANCY, URINE: Preg Test, Ur: POSITIVE

## 2011-02-24 ENCOUNTER — Encounter: Payer: Self-pay | Admitting: Family Medicine

## 2011-02-28 ENCOUNTER — Encounter: Payer: Self-pay | Admitting: Family Medicine

## 2011-02-28 ENCOUNTER — Ambulatory Visit: Payer: BC Managed Care – PPO | Admitting: Family Medicine

## 2011-05-15 ENCOUNTER — Encounter: Payer: Self-pay | Admitting: Family Medicine

## 2011-05-16 ENCOUNTER — Ambulatory Visit: Payer: BC Managed Care – PPO | Admitting: Family Medicine

## 2011-05-17 ENCOUNTER — Encounter: Payer: Self-pay | Admitting: Family Medicine

## 2011-05-18 ENCOUNTER — Ambulatory Visit (INDEPENDENT_AMBULATORY_CARE_PROVIDER_SITE_OTHER): Payer: Self-pay | Admitting: Family Medicine

## 2011-05-18 ENCOUNTER — Encounter: Payer: Self-pay | Admitting: Family Medicine

## 2011-05-18 VITALS — BP 112/80 | HR 88 | Resp 16 | Ht 60.0 in | Wt 146.0 lb

## 2011-05-18 DIAGNOSIS — F419 Anxiety disorder, unspecified: Secondary | ICD-10-CM

## 2011-05-18 DIAGNOSIS — N760 Acute vaginitis: Secondary | ICD-10-CM

## 2011-05-18 DIAGNOSIS — F329 Major depressive disorder, single episode, unspecified: Secondary | ICD-10-CM

## 2011-05-18 DIAGNOSIS — N3000 Acute cystitis without hematuria: Secondary | ICD-10-CM

## 2011-05-18 DIAGNOSIS — N771 Vaginitis, vulvitis and vulvovaginitis in diseases classified elsewhere: Secondary | ICD-10-CM

## 2011-05-18 DIAGNOSIS — R350 Frequency of micturition: Secondary | ICD-10-CM | POA: Insufficient documentation

## 2011-05-18 DIAGNOSIS — D500 Iron deficiency anemia secondary to blood loss (chronic): Secondary | ICD-10-CM

## 2011-05-18 DIAGNOSIS — F411 Generalized anxiety disorder: Secondary | ICD-10-CM

## 2011-05-18 DIAGNOSIS — A499 Bacterial infection, unspecified: Secondary | ICD-10-CM

## 2011-05-18 DIAGNOSIS — E559 Vitamin D deficiency, unspecified: Secondary | ICD-10-CM

## 2011-05-18 DIAGNOSIS — R5381 Other malaise: Secondary | ICD-10-CM

## 2011-05-18 DIAGNOSIS — N920 Excessive and frequent menstruation with regular cycle: Secondary | ICD-10-CM

## 2011-05-18 LAB — POCT URINALYSIS DIPSTICK
Leukocytes, UA: NEGATIVE
Protein, UA: NEGATIVE
Urobilinogen, UA: 0.2
pH, UA: 5

## 2011-05-18 MED ORDER — FLUOXETINE HCL 10 MG PO CAPS: 10.0000 mg | ORAL_CAPSULE | Freq: Every day | ORAL | Status: DC

## 2011-05-18 NOTE — Assessment & Plan Note (Signed)
rept lab today

## 2011-05-18 NOTE — Assessment & Plan Note (Signed)
Reports polymenoreah and menorragia, rept blood test, also pelvic US

## 2011-05-18 NOTE — Assessment & Plan Note (Addendum)
Deteriorated, unemployed x 4 month, not suicidal or homicidal, will resume anti depressant

## 2011-05-18 NOTE — Patient Instructions (Addendum)
F/U in 5.5 weeks.  New medication sent in for depression.  You are referred for pelvic US  CBC and anemia panel and vit D today   You will be referred to gynaecology re heavy bleeding if you are still very anemic , after your pelvic US is available

## 2011-05-19 DIAGNOSIS — B9689 Other specified bacterial agents as the cause of diseases classified elsewhere: Secondary | ICD-10-CM | POA: Insufficient documentation

## 2011-05-19 DIAGNOSIS — N760 Acute vaginitis: Secondary | ICD-10-CM | POA: Insufficient documentation

## 2011-05-19 LAB — WET PREP BY MOLECULAR PROBE: Candida species: NEGATIVE

## 2011-05-19 LAB — GC/CHLAMYDIA PROBE AMP, GENITAL
Chlamydia, DNA Probe: NEGATIVE
GC Probe Amp, Genital: NEGATIVE

## 2011-05-19 MED ORDER — METRONIDAZOLE 500 MG PO TABS
500.0000 mg | ORAL_TABLET | Freq: Two times a day (BID) | ORAL | Status: AC
Start: 2011-05-19 — End: 2011-05-29

## 2011-05-21 NOTE — Assessment & Plan Note (Signed)
Increased and un uncontrolled

## 2011-05-21 NOTE — Progress Notes (Signed)
  Subjective:    Patient ID: Rebecca Boyd, female    DOB: 1975/11/30, 36 y.o.   MRN: [id number]  HPI Pt in reporting deterioration  in her health. She unfortunately has been unemployed for several months which has made her increasingly depressed, states she "gets ill all the time" Denies any current hallucinations, suicidal or homicidal thoughts.Has responded in the past to medication and wants to resume antidepressant. C/o increased lower  Abdominal pain, with malodorous vaginal discharge, also some dysuria and frequency.  Still having    Review of Systems See HPI Denies recent fever or chills. Denies sinus pressure, nasal congestion, ear pain or sore throat. Denies chest congestion, productive cough or wheezing. Denies chest pains, palpitations and leg swelling Denies nausea, vomiting,diarrhea or constipation.    Denies joint pain, swelling and limitation in mobility. Denies headaches, seizures, numbness, or tingling.  Denies skin break down or rash.        Objective:   Physical Exam Patient alert and oriented and in no cardiopulmonary distress.  HEENT: No facial asymmetry, EOMI, no sinus tenderness,  oropharynx pink and moist.  Neck supple no adenopathy.  Chest: Clear to auscultation bilaterally.  CVS: S1, S2 no murmurs, no S3.  ABD: Soft lower abdominal tenderness, no guarding or rebound. Bowel sounds normal.  Ext: No edema  MS: Adequate ROM spine, shoulders, hips and knees.  Skin: Intact, no ulcerations or rash noted.  Psych: Good eye contact, normal affect. Memory intact both anxious  And depressed appearing.  CNS: CN 2-12 intact, power, tone and sensation normal throughout.        Assessment & Plan:

## 2011-05-21 NOTE — Assessment & Plan Note (Signed)
Iron deficiency anemia and untreated depression both contributing. Regular iron intake, regualr exercise and also use of anti depressant med encouraged

## 2011-05-21 NOTE — Assessment & Plan Note (Signed)
Specimens sent, will treat based on result, h/o recurrent Bv

## 2011-05-22 ENCOUNTER — Other Ambulatory Visit (HOSPITAL_COMMUNITY): Payer: Self-pay

## 2011-05-26 ENCOUNTER — Ambulatory Visit (HOSPITAL_COMMUNITY)
Admission: RE | Admit: 2011-05-26 | Discharge: 2011-05-26 | Disposition: A | Discharge status: Home / Self Care | Payer: Self-pay | Source: Ambulatory Visit | Attending: Family Medicine | Admitting: Family Medicine

## 2011-05-26 ENCOUNTER — Other Ambulatory Visit: Payer: Self-pay | Admitting: Family Medicine

## 2011-05-26 DIAGNOSIS — N8300 Follicular cyst of ovary, unspecified side: Secondary | ICD-10-CM | POA: Insufficient documentation

## 2011-05-26 DIAGNOSIS — D500 Iron deficiency anemia secondary to blood loss (chronic): Secondary | ICD-10-CM

## 2011-05-26 DIAGNOSIS — R1031 Right lower quadrant pain: Secondary | ICD-10-CM | POA: Insufficient documentation

## 2011-05-26 DIAGNOSIS — R1032 Left lower quadrant pain: Secondary | ICD-10-CM | POA: Insufficient documentation

## 2011-05-26 DIAGNOSIS — N920 Excessive and frequent menstruation with regular cycle: Secondary | ICD-10-CM

## 2011-07-26 ENCOUNTER — Other Ambulatory Visit: Payer: Self-pay | Admitting: Family Medicine

## 2011-08-02 ENCOUNTER — Ambulatory Visit: Payer: BC Managed Care – PPO | Admitting: Family Medicine

## 2011-08-03 ENCOUNTER — Emergency Department (HOSPITAL_COMMUNITY)
Admission: EM | Admit: 2011-08-03 | Discharge: 2011-08-03 | Disposition: A | Discharge status: Home / Self Care | Payer: Self-pay | Attending: Emergency Medicine | Admitting: Emergency Medicine

## 2011-08-03 ENCOUNTER — Ambulatory Visit: Payer: BC Managed Care – PPO | Admitting: Family Medicine

## 2011-08-03 ENCOUNTER — Encounter (HOSPITAL_COMMUNITY): Payer: Self-pay | Admitting: *Deleted

## 2011-08-03 DIAGNOSIS — M25519 Pain in unspecified shoulder: Secondary | ICD-10-CM | POA: Insufficient documentation

## 2011-08-03 DIAGNOSIS — T148XXA Other injury of unspecified body region, initial encounter: Secondary | ICD-10-CM

## 2011-08-03 DIAGNOSIS — X58XXXA Exposure to other specified factors, initial encounter: Secondary | ICD-10-CM | POA: Insufficient documentation

## 2011-08-03 DIAGNOSIS — IMO0002 Reserved for concepts with insufficient information to code with codable children: Secondary | ICD-10-CM | POA: Insufficient documentation

## 2011-08-03 MED ORDER — DIAZEPAM 5 MG PO TABS
5.0000 mg | ORAL_TABLET | Freq: Once | ORAL | Status: AC
  Administered 2011-08-03: 5 mg via ORAL
  Filled 2011-08-03: qty 1

## 2011-08-03 MED ORDER — KETOROLAC TROMETHAMINE 60 MG/2ML IM SOLN
60.0000 mg | Freq: Once | INTRAMUSCULAR | Status: AC
  Administered 2011-08-03: 60 mg via INTRAMUSCULAR
  Filled 2011-08-03: qty 2

## 2011-08-03 MED ORDER — METHOCARBAMOL 500 MG PO TABS: ORAL_TABLET | ORAL | Status: DC

## 2011-08-03 MED ORDER — HYDROCODONE-ACETAMINOPHEN 5-325 MG PO TABS: ORAL_TABLET | ORAL | Status: AC

## 2011-08-03 NOTE — Discharge Instructions (Signed)
Sprains Sprains are painful injuries to joints as a result of partial or complete tearing of ligaments. HOME CARE INSTRUCTIONS   For the first 24 hours, keep the injured limb raised on 2 pillows while lying down.   Apply ice bags about every 2 hours for 20 to 30 minutes, while awake, to the injured area for the first 24 hours. Then apply as directed by your caregiver. Place the ice in a plastic bag with a towel around it to prevent frostbite to the skin.   Only take over-the-counter or prescription medicines for pain, discomfort, or fever as directed by your caregiver.   If an ace bandage (a stretchy, elastic wrapping bandage) has been applied today, remove and reapply every 3 to 4 hours. Apply firm enough to keep swelling down. Donot apply tightly. Watch fingers or toes for swelling, bluish discoloration, coldness, numbness, or excessive pain. If any of these problems (symptoms) occur, remove the ace bandage and reapply it more loosely. Contact your caregiver or return to this location if these symptoms persist.  Persistent pain and inability to use the injured area for more than 2 to 3 days are warning signs. See a caregiver for a follow-up visit as soon as possible. A hairline fracture (broken bone) may not show on X-rays. Persistent pain and swelling indicate that further evaluation, use of crutches, and/or more X-rays are needed. X-rays may sometimes not show a small fracture until a week or ten days later. Make a follow-up appointment with your own caregiver or to whom we have referred you. A specialist in reading X-rays(radiologist) will re-read your X-rays. Make sure you know how to obtain your X-ray results. Do not assume everything is normal if you do not hear from [place]. SEEK IMMEDIATE MEDICAL CARE IF:  You develop severe pain or more swelling.   The pain is not controlled with medicine.   Your skin or nails below the injury turn blue or grey or feel cold or numb.  Document Released:  04/14/2000 Document Revised: 04/06/2011 Document Reviewed: 12/02/2007 [facility] Patient Information 2012 [place], [place].Muscle Strain A muscle strain (pulled muscle) happens when a muscle is over-stretched. Recovery usually takes 5 to 6 weeks.  HOME CARE   Put ice on the injured area.   Put ice in a plastic bag.   Place a towel between your skin and the bag.   Leave the ice on for 15 to 20 minutes at a time, every hour for the first 2 days.   Do not use the muscle for several days or until your doctor says you can. Do not use the muscle if you have pain.   Wrap the injured area with an elastic bandage for comfort. Do not put it on too tightly.   Only take medicine as told by your doctor.   Warm up before exercise. This helps prevent muscle strains.  GET HELP RIGHT AWAY IF:  There is increased pain or puffiness (swelling) in the affected area. MAKE SURE YOU:   Understand these instructions.   Will watch your condition.   Will get help right away if you are not doing well or get worse.  Document Released: 01/25/2008 Document Revised: 04/06/2011 Document Reviewed: 01/25/2008 [facility] Patient Information 2012 [place], [place].

## 2011-08-03 NOTE — ED Notes (Addendum)
Pain lt shoulder, no injury , says she cannot move it.    Pain seems to be more in scapular region. Good radial pulse .

## 2011-08-03 NOTE — ED Provider Notes (Signed)
History     CSN: [id number]  Arrival date & time 08/03/11  1301   First MD Initiated Contact with Patient 08/03/11 1314      Chief Complaint  Patient presents with  . Shoulder Pain    (Consider location/radiation/quality/duration/timing/severity/associated sxs/prior treatment) HPI Comments: Patient c/o pain to her left posterior shoulder for several days.  She is unsure of injury but states she does heavy lifting at times.  Pain is worse with movement of the left arm and improves with her arm resting next to her body.  She denies neck stiffness, headaches, numbness or fever.  Patient is a 36 y.o. female presenting with shoulder pain. The history is provided by the patient.  Shoulder Pain This is a new problem. The current episode started in the past 7 days. The problem occurs constantly. The problem has been unchanged. Associated symptoms include arthralgias. Pertinent negatives include no abdominal pain, chest pain, chills, coughing, fatigue, fever, headaches, joint swelling, myalgias, nausea, neck pain, numbness, rash, sore throat, vomiting or weakness. Exacerbated by: movment and palpation. She has tried nothing for the symptoms. The treatment provided no relief.    Past Medical History  Diagnosis Date  . Depressive disorder, not elsewhere classified   . Neck pain   . Vaginitis     cyctitis  . Miscarriage 2009  . Anemia     History reviewed. No pertinent past surgical history.  Family History  Problem Relation Age of Onset  . Drug abuse Mother   . Hypertension Father     History  Substance Use Topics  . Smoking status: Current Some Day Smoker    Types: Cigarettes  . Smokeless tobacco: Not on file  . Alcohol Use: No    OB History    Grav Para Term Preterm Abortions TAB SAB Ect Mult Living                  Review of Systems  Constitutional: Negative for fever, chills and fatigue.  HENT: Negative for sore throat, facial swelling, trouble swallowing, neck pain  and neck stiffness.   Eyes: Negative for visual disturbance.  Respiratory: Negative for cough and shortness of breath.   Cardiovascular: Negative for chest pain.  Gastrointestinal: Negative for nausea, vomiting, abdominal pain and blood in stool.  Genitourinary: Negative for flank pain.  Musculoskeletal: Positive for arthralgias. Negative for myalgias, back pain and joint swelling.  Skin: Negative for rash.  Neurological: Negative for dizziness, facial asymmetry, speech difficulty, weakness, numbness and headaches.  Hematological: Does not bruise/bleed easily.  All other systems reviewed and are negative.    Allergies  Diphenhydramine hcl and Orange fruit  Home Medications   Current Outpatient Rx  Name Route Sig Dispense Refill  . ALBUTEROL SULFATE HFA 108 (90 BASE) MCG/ACT IN AERS Inhalation Inhale 2 puffs into the lungs every 6 (six) hours as needed. 2 puffs every 6-8 hours as needed for wheezing     . ALPRAZOLAM 1 MG PO TABS Oral Take 1 mg by mouth as needed. Take 1 tablet by mouth once a day as needed     . VITAMIN D3 50000 UNITS PO CAPS Oral Take 1 capsule by mouth daily. Pt, takes one daily forabout 1 week, then takes one weekly thereafter     . FAMOTIDINE 20 MG PO TABS Oral Take 1 tablet (20 mg total) by mouth 2 (two) times daily. 30 tablet 0  . FERROUS SULFATE 325 (65 FE) MG PO TABS Oral Take 1 tablet (325 mg  total) by mouth daily. 30 tablet 0  . FLUOXETINE HCL 10 MG PO CAPS Oral Take 1 capsule (10 mg total) by mouth daily. 30 capsule 2  . FLUTICASONE PROPIONATE 50 MCG/ACT NA SUSP Nasal 1 spray by Nasal route 2 (two) times daily. 1 puff per nostril twice daily     . IBUPROFEN 800 MG PO TABS Oral Take 800 mg by mouth as needed. For pain/headache     . NORGESTIMATE-ETH ESTRADIOL 0.25-35 MG-MCG PO TABS Oral Take 1 tablet by mouth daily. Use as directed     . POLYSACCHARIDE IRON 150 MG PO CAPS Oral Take by mouth. Take 1 capsule by mouth three times a day     . ZOVIRAX 400 MG PO  TABS  TAKE 1 TABLET BY MOUTH TWICE A DAY. BEGIN AFTER COMPLETION OF 10 DAY COURSE. 60 each 2    BP 143/92  Pulse 80  Temp(Src) 98.1 F (36.7 C) (Oral)  Resp 20  Ht 5' (1.524 m)  Wt 145 lb (65.772 kg)  BMI 28.32 kg/m2  SpO2 100%  LMP 08/03/2011  Physical Exam  Nursing note and vitals reviewed. Constitutional: She is oriented to person, place, and time. She appears well-developed and well-nourished. No distress.  HENT:  Head: Normocephalic and atraumatic.  Mouth/Throat: Oropharynx is clear and moist.  Neck: Normal range of motion. Neck supple.  Cardiovascular: Normal rate, regular rhythm and normal heart sounds.   Pulmonary/Chest: Effort normal and breath sounds normal. No respiratory distress. She exhibits no tenderness.  Musculoskeletal: Normal range of motion. She exhibits tenderness. She exhibits no edema.       Cervical back: She exhibits tenderness and pain. She exhibits normal range of motion, no swelling, no edema, no laceration and normal pulse.       Back:  Lymphadenopathy:    She has no cervical adenopathy.  Neurological: She is alert and oriented to person, place, and time. No cranial nerve deficit or sensory deficit. She exhibits normal muscle tone. Coordination normal.  Reflex Scores:      Tricep reflexes are 2+ on the right side and 2+ on the left side.      Bicep reflexes are 2+ on the right side and 2+ on the left side.      Brachioradialis reflexes are 2+ on the right side and 2+ on the left side. Skin: Skin is warm and dry.    ED Course  Procedures (including critical care time)       MDM    ttp of the left trapezius muscles and along the border of the left scapula.  Pain is reproduced with abduction of the left arm.  No meningeal signs or focal neuro deficits.  Likley muscular injury.  Sensation to the left arm is intact, radial pulse is brisk, CR <2 sec  IM toradol and po valium given here.   Patient / Family / Caregiver understand and agree with  initial ED impression and plan with expectations set for ED visit. Pt stable in ED with no significant deterioration in condition. Pt feels improved after observation and/or treatment in ED.          Joliene Salvador L. Kashari Chalmers, PA 08/08/11 1351  Mckinley Olheiser L. Sukari Grist, [place] 08/08/11 1352

## 2011-08-11 NOTE — ED Provider Notes (Signed)
Medical screening examination/treatment/procedure(s) were performed by non-physician practitioner and as supervising physician I was immediately available for consultation/collaboration.   Marci Polito L Staci Carver, MD 08/11/11 1815 

## 2011-10-02 ENCOUNTER — Ambulatory Visit: Payer: BC Managed Care – PPO | Admitting: Family Medicine

## 2011-11-27 ENCOUNTER — Encounter (HOSPITAL_COMMUNITY): Payer: Self-pay | Admitting: *Deleted

## 2011-11-27 ENCOUNTER — Emergency Department (HOSPITAL_COMMUNITY)
Admission: EM | Admit: 2011-11-27 | Discharge: 2011-11-27 | Disposition: A | Discharge status: Home / Self Care | Payer: Self-pay | Attending: Emergency Medicine | Admitting: Emergency Medicine

## 2011-11-27 DIAGNOSIS — R112 Nausea with vomiting, unspecified: Secondary | ICD-10-CM | POA: Insufficient documentation

## 2011-11-27 DIAGNOSIS — D649 Anemia, unspecified: Secondary | ICD-10-CM | POA: Insufficient documentation

## 2011-11-27 DIAGNOSIS — N920 Excessive and frequent menstruation with regular cycle: Secondary | ICD-10-CM | POA: Insufficient documentation

## 2011-11-27 DIAGNOSIS — E860 Dehydration: Secondary | ICD-10-CM | POA: Insufficient documentation

## 2011-11-27 DIAGNOSIS — F17200 Nicotine dependence, unspecified, uncomplicated: Secondary | ICD-10-CM | POA: Insufficient documentation

## 2011-11-27 LAB — CBC WITH DIFFERENTIAL/PLATELET
Basophils Absolute: 0 10*3/uL (ref 0.0–0.1)
Eosinophils Absolute: 0.2 10*3/uL (ref 0.0–0.7)
Lymphocytes Relative: 30 % (ref 12–46)
MCHC: 30.3 g/dL (ref 30.0–36.0)
Neutrophils Relative %: 62 % (ref 43–77)
RDW: 19.9 % — ABNORMAL HIGH (ref 11.5–15.5)

## 2011-11-27 LAB — URINALYSIS, ROUTINE W REFLEX MICROSCOPIC
Ketones, ur: NEGATIVE mg/dL
Leukocytes, UA: NEGATIVE
Nitrite: NEGATIVE
Protein, ur: NEGATIVE mg/dL
pH: 6.5 (ref 5.0–8.0)

## 2011-11-27 LAB — COMPREHENSIVE METABOLIC PANEL
ALT: 17 U/L (ref 0–35)
AST: 23 U/L (ref 0–37)
Calcium: 9.5 mg/dL (ref 8.4–10.5)
GFR calc Af Amer: >90 mL/min (ref >90)
Sodium: 139 mEq/L (ref 135–145)
Total Protein: 7.5 g/dL (ref 6.0–8.3)

## 2011-11-27 MED ORDER — ONDANSETRON HCL 4 MG/2ML IJ SOLN
4.0000 mg | Freq: Once | INTRAMUSCULAR | Status: AC
  Administered 2011-11-27: 4 mg via INTRAVENOUS
  Filled 2011-11-27: qty 2

## 2011-11-27 MED ORDER — PROMETHAZINE HCL 25 MG PO TABS: 25.0000 mg | ORAL_TABLET | Freq: Three times a day (TID) | ORAL | Status: DC | PRN

## 2011-11-27 MED ORDER — SODIUM CHLORIDE 0.9 % IV SOLN: 1000.0000 mL | INTRAVENOUS | Status: DC

## 2011-11-27 MED ORDER — SODIUM CHLORIDE 0.9 % IV BOLUS (SEPSIS)
1000.0000 mL | Freq: Once | INTRAVENOUS | Status: AC
  Administered 2011-11-27: 1000 mL via INTRAVENOUS

## 2011-11-27 MED ORDER — SODIUM CHLORIDE 0.9 % IV SOLN
1000.0000 mL | Freq: Once | INTRAVENOUS | Status: AC
  Administered 2011-11-27: 1000 mL via INTRAVENOUS

## 2011-11-27 NOTE — ED Notes (Signed)
Pt c/o NV and dizziness that began Friday. Pt states symptoms "come and go". Pt denies diarrhea and chest pain. Pt does c/o abdominal cramping "at times". Pt denies and changes in urinary symptoms. Last period was 3 weeks ago.

## 2011-11-27 NOTE — ED Provider Notes (Cosign Needed)
History     CSN: [id number]  Arrival date & time 11/27/11  1732   First MD Initiated Contact with Patient 11/27/11 1824      Chief Complaint  Patient presents with  . Dizziness  . Nausea    (Consider location/radiation/quality/duration/timing/severity/associated sxs/prior treatment) HPI  Patient reports 3 days ago she started having nausea and had vomiting about 4 times. She states she had some mild upper abdominal discomfort that comes and goes and is currently gone. She relates she's had persistent nausea since then and she feels weak. She states now she has some discomfort in her lower abdomen that she states is a "tingling" sensation. She denies abdominal cramping. She denies fever, chest pain, shortness of breath, or sore throat. She states nothing makes her feel better and nothing makes her feel worse. She had not eaten prior to feeling bad.  She reports she thinks maybe her iron is low because she does have heavy periods. She's also concerned that she may be pregnant although she has not missed a period yet.  PCP Dr Syliva Overman   Past Medical History  Diagnosis Date  . Depressive disorder, not elsewhere classified   . Neck pain   . Vaginitis     cyctitis  . Miscarriage 2009  . Anemia     History reviewed. No pertinent past surgical history.  Family History  Problem Relation Age of Onset  . Drug abuse Mother   . Hypertension Father     History  Substance Use Topics  . Smoking status: Current Some Day Smoker    Types: Cigarettes  . Smokeless tobacco: Not on file  . Alcohol Use: No  employed  OB History    Grav Para Term Preterm Abortions TAB SAB Ect Mult Living                  Review of Systems  All other systems reviewed and are negative.    Allergies  Diphenhydramine hcl and Orange fruit  Home Medications   Current Outpatient Rx  Name Route Sig Dispense Refill  . ALBUTEROL SULFATE HFA 108 (90 BASE) MCG/ACT IN AERS Inhalation Inhale 2  puffs into the lungs every 6 (six) hours as needed. 2 puffs every 6-8 hours as needed for wheezing     . ALPRAZOLAM 1 MG PO TABS Oral Take 1 mg by mouth as needed. Take 1 tablet by mouth once a day as needed     . VITAMIN D3 50000 UNITS PO CAPS Oral Take 1 capsule by mouth daily. Pt, takes one daily forabout 1 week, then takes one weekly thereafter     . FAMOTIDINE 20 MG PO TABS Oral Take 1 tablet (20 mg total) by mouth 2 (two) times daily. 30 tablet 0  . FERROUS SULFATE 325 (65 FE) MG PO TABS Oral Take 1 tablet (325 mg total) by mouth daily. 30 tablet 0  . FLUOXETINE HCL 10 MG PO CAPS Oral Take 1 capsule (10 mg total) by mouth daily. 30 capsule 2  . FLUTICASONE PROPIONATE 50 MCG/ACT NA SUSP Nasal 1 spray by Nasal route 2 (two) times daily. 1 puff per nostril twice daily     . IBUPROFEN 800 MG PO TABS Oral Take 800 mg by mouth as needed. For pain/headache     . METHOCARBAMOL 500 MG PO TABS  Take two tablets po TID prn muscle spasm 42 tablet 0  . NORGESTIMATE-ETH ESTRADIOL 0.25-35 MG-MCG PO TABS Oral Take 1 tablet by mouth daily. Use  as directed     . POLYSACCHARIDE IRON 150 MG PO CAPS Oral Take by mouth. Take 1 capsule by mouth three times a day     . ZOVIRAX 400 MG PO TABS  TAKE 1 TABLET BY MOUTH TWICE A DAY. BEGIN AFTER COMPLETION OF 10 DAY COURSE. 60 each 2    BP 139/72  Pulse 87  Temp 97.7 F (36.5 C) (Oral)  Resp 20  Ht 5' (1.524 m)  Wt 140 lb (63.504 kg)  BMI 27.34 kg/m2  SpO2 100%  LMP 11/13/2011  Vital signs normal    Physical Exam  Nursing note and vitals reviewed. Constitutional: She is oriented to person, place, and time. She appears well-developed and well-nourished.  Non-toxic appearance. She does not appear ill. No distress.  HENT:  Head: Normocephalic and atraumatic.  Right Ear: External ear normal.  Left Ear: External ear normal.  Nose: Nose normal. No mucosal edema or rhinorrhea.  Mouth/Throat: Oropharynx is clear and moist and mucous membranes are normal. No  dental abscesses or uvula swelling.  Eyes: Conjunctivae and EOM are normal. Pupils are equal, round, and reactive to light.  Neck: Normal range of motion and full passive range of motion without pain. Neck supple.  Cardiovascular: Normal rate, regular rhythm and normal heart sounds.  Exam reveals no gallop and no friction rub.   No murmur heard. Pulmonary/Chest: Effort normal and breath sounds normal. No respiratory distress. She has no wheezes. She has no rhonchi. She has no rales. She exhibits no tenderness and no crepitus.  Abdominal: Soft. Normal appearance and bowel sounds are normal. She exhibits no distension. There is no tenderness. There is no rebound and no guarding.  Musculoskeletal: Normal range of motion. She exhibits no edema and no tenderness.       Moves all extremities well.   Neurological: She is alert and oriented to person, place, and time. She has normal strength. No cranial nerve deficit.  Skin: Skin is warm, dry and intact. No rash noted. No erythema. No pallor.  Psychiatric: She has a normal mood and affect. Her speech is normal and behavior is normal. Her mood appears not anxious.    ED Course  Procedures (including critical care time)   Medications  0.9 %  sodium chloride infusion (1000 mL Intravenous New Bag/Given 11/27/11 1859)    Followed by  0.9 %  sodium chloride infusion (not administered)  ondansetron (ZOFRAN) injection 4 mg (4 mg Intravenous Given 11/27/11 1858)  sodium chloride 0.9 % bolus 1,000 mL (1000 mL Intravenous Given 11/27/11 2014)   Patient reports she has very heavy periods. She has been evaluated for iron deficiency anemia by Dr. Lodema Hong. She relates she's not taking any iron pills at this time.  Pt feeling better, wanting to drink fluids.    Results for orders placed during the hospital encounter of 11/27/11  URINALYSIS, ROUTINE W REFLEX MICROSCOPIC      Component Value Range   Color, Urine YELLOW  YELLOW   APPearance CLEAR  CLEAR    Specific Gravity, Urine 1.025  1.005 - 1.030   pH 6.5  5.0 - 8.0   Glucose, UA NEGATIVE  NEGATIVE mg/dL   Hgb urine dipstick NEGATIVE  NEGATIVE   Bilirubin Urine NEGATIVE  NEGATIVE   Ketones, ur NEGATIVE  NEGATIVE mg/dL   Protein, ur NEGATIVE  NEGATIVE mg/dL   Urobilinogen, UA 0.2  0.0 - 1.0 mg/dL   Nitrite NEGATIVE  NEGATIVE   Leukocytes, UA NEGATIVE  NEGATIVE  CBC  WITH DIFFERENTIAL      Component Value Range   WBC 6.8  4.0 - 10.5 K/uL   RBC 4.27  3.87 - 5.11 MIL/uL   Hemoglobin 8.1 (*) 12.0 - 15.0 g/dL   HCT 16.1 (*) 09.6 - 04.5 %   MCV 62.5 (*) 78.0 - 100.0 fL   MCH 19.0 (*) 26.0 - 34.0 pg   MCHC 30.3  30.0 - 36.0 g/dL   RDW 40.9 (*) 81.1 - 91.4 %   Platelets 172  150 - 400 K/uL   Neutrophils Relative 62  43 - 77 %   Lymphocytes Relative 30  12 - 46 %   Monocytes Relative 5  3 - 12 %   Eosinophils Relative 3  0 - 5 %   Basophils Relative 0  0 - 1 %   Neutro Abs 4.3  1.7 - 7.7 K/uL   Lymphs Abs 2.0  0.7 - 4.0 K/uL   Monocytes Absolute 0.3  0.1 - 1.0 K/uL   Eosinophils Absolute 0.2  0.0 - 0.7 K/uL   Basophils Absolute 0.0  0.0 - 0.1 K/uL   WBC Morphology TOXIC GRANULATION     Smear Review PLATELET COUNT CONFIRMED BY SMEAR    COMPREHENSIVE METABOLIC PANEL      Component Value Range   Sodium 139  135 - 145 mEq/L   Potassium 3.4 (*) 3.5 - 5.1 mEq/L   Chloride 106  96 - 112 mEq/L   CO2 24  19 - 32 mEq/L   Glucose, Bld 76  70 - 99 mg/dL   BUN 9  6 - 23 mg/dL   Creatinine, Ser 7.82  0.50 - 1.10 mg/dL   Calcium 9.5  8.4 - 95.6 mg/dL   Total Protein 7.5  6.0 - 8.3 g/dL   Albumin 3.9  3.5 - 5.2 g/dL   AST 23  0 - 37 U/L   ALT 17  0 - 35 U/L   Alkaline Phosphatase 79  39 - 117 U/L   Total Bilirubin 0.2 (*) 0.3 - 1.2 mg/dL   GFR calc non Af Amer 85 (*) >90 mL/min   GFR calc Af Amer >90  >90 mL/min  PREGNANCY, URINE      Component Value Range   Preg Test, Ur NEGATIVE  NEGATIVE   Laboratory interpretation all normal except anemia which she has had since January, her lowest  has been 7.7 and her highest 10.    No results found.   1. Anemia   2. Dehydration   3. Nausea and vomiting   4. Menorrhagia    New Prescriptions   PROMETHAZINE (PHENERGAN) 25 MG TABLET    Take 1 tablet (25 mg total) by mouth every 8 (eight) hours as needed for nausea.    Plan discharge  Devoria Albe, MD, FACEP    MDM          Ward Givens, MD 11/27/11 2023

## 2011-11-27 NOTE — ED Notes (Signed)
Dizziness and nausea since Friday

## 2012-05-09 ENCOUNTER — Encounter (HOSPITAL_COMMUNITY): Payer: Self-pay | Admitting: *Deleted

## 2012-05-09 ENCOUNTER — Emergency Department (HOSPITAL_COMMUNITY)
Admission: EM | Admit: 2012-05-09 | Discharge: 2012-05-09 | Disposition: A | Discharge status: Home / Self Care | Payer: Self-pay | Attending: Emergency Medicine | Admitting: Emergency Medicine

## 2012-05-09 DIAGNOSIS — R51 Headache: Secondary | ICD-10-CM | POA: Insufficient documentation

## 2012-05-09 DIAGNOSIS — Z8742 Personal history of other diseases of the female genital tract: Secondary | ICD-10-CM | POA: Insufficient documentation

## 2012-05-09 DIAGNOSIS — Z79899 Other long term (current) drug therapy: Secondary | ICD-10-CM | POA: Insufficient documentation

## 2012-05-09 DIAGNOSIS — F17200 Nicotine dependence, unspecified, uncomplicated: Secondary | ICD-10-CM | POA: Insufficient documentation

## 2012-05-09 DIAGNOSIS — Z862 Personal history of diseases of the blood and blood-forming organs and certain disorders involving the immune mechanism: Secondary | ICD-10-CM | POA: Insufficient documentation

## 2012-05-09 DIAGNOSIS — R110 Nausea: Secondary | ICD-10-CM | POA: Insufficient documentation

## 2012-05-09 DIAGNOSIS — Z8659 Personal history of other mental and behavioral disorders: Secondary | ICD-10-CM | POA: Insufficient documentation

## 2012-05-09 DIAGNOSIS — Z8739 Personal history of other diseases of the musculoskeletal system and connective tissue: Secondary | ICD-10-CM | POA: Insufficient documentation

## 2012-05-09 MED ORDER — MORPHINE SULFATE 4 MG/ML IJ SOLN
8.0000 mg | Freq: Once | INTRAMUSCULAR | Status: AC
  Administered 2012-05-09: 8 mg via INTRAMUSCULAR
  Filled 2012-05-09: qty 2

## 2012-05-09 MED ORDER — PROMETHAZINE HCL 12.5 MG PO TABS
12.5000 mg | ORAL_TABLET | Freq: Once | ORAL | Status: AC
  Administered 2012-05-09: 12.5 mg via ORAL
  Filled 2012-05-09: qty 1

## 2012-05-09 MED ORDER — HYDROCODONE-ACETAMINOPHEN 5-325 MG PO TABS: ORAL_TABLET | ORAL | Status: DC

## 2012-05-09 MED ORDER — KETOROLAC TROMETHAMINE 10 MG PO TABS
10.0000 mg | ORAL_TABLET | Freq: Once | ORAL | Status: AC
  Administered 2012-05-09: 10 mg via ORAL
  Filled 2012-05-09: qty 1

## 2012-05-09 MED ORDER — PROMETHAZINE HCL 25 MG PO TABS: 25.0000 mg | ORAL_TABLET | Freq: Four times a day (QID) | ORAL | Status: DC | PRN

## 2012-05-09 NOTE — ED Notes (Signed)
Headache for 3 days, intermittent dizziness, no NVD. No hx  Of injury

## 2012-05-09 NOTE — ED Provider Notes (Signed)
History     CSN: [id number]  Arrival date & time 05/09/12  1335   First MD Initiated Contact with Patient 05/09/12 1540      Chief Complaint  Patient presents with  . Headache    (Consider location/radiation/quality/duration/timing/severity/associated sxs/prior treatment) Patient is a 37 y.o. female presenting with headaches. The history is provided by the patient.  Headache  This is a new problem. The current episode started more than 2 days ago. The problem occurs hourly. The problem has been gradually worsening. The headache is associated with bright light. The pain is located in the left unilateral region. The quality of the pain is described as throbbing. The pain is moderate. The pain radiates to the left neck. Associated symptoms include nausea. Pertinent negatives include no anorexia, no fever, no malaise/fatigue, no near-syncope, no palpitations, no syncope, no shortness of breath and no vomiting. She has tried acetaminophen and NSAIDs for the symptoms. The treatment provided no relief.    Past Medical History  Diagnosis Date  . Depressive disorder, not elsewhere classified   . Neck pain   . Vaginitis     cyctitis  . Miscarriage 2009  . Anemia     History reviewed. No pertinent past surgical history.  Family History  Problem Relation Age of Onset  . Drug abuse Mother   . Hypertension Father     History  Substance Use Topics  . Smoking status: Current Some Day Smoker    Types: Cigarettes  . Smokeless tobacco: Not on file  . Alcohol Use: No    OB History    Grav Para Term Preterm Abortions TAB SAB Ect Mult Living                  Review of Systems  Constitutional: Negative for fever, malaise/fatigue and activity change.       All ROS Neg except as noted in HPI  HENT: Negative for nosebleeds and neck pain.   Eyes: Negative for photophobia and discharge.  Respiratory: Negative for cough, shortness of breath and wheezing.   Cardiovascular: Negative for  chest pain, palpitations, syncope and near-syncope.  Gastrointestinal: Positive for nausea. Negative for vomiting, abdominal pain, blood in stool and anorexia.  Genitourinary: Negative for dysuria, frequency and hematuria.  Musculoskeletal: Negative for back pain and arthralgias.  Skin: Negative.   Neurological: Positive for headaches. Negative for dizziness, seizures and speech difficulty.  Psychiatric/Behavioral: Negative for hallucinations and confusion.    Allergies  Diphenhydramine hcl and Orange fruit  Home Medications   Current Outpatient Rx  Name  Route  Sig  Dispense  Refill  . ALBUTEROL SULFATE HFA 108 (90 BASE) MCG/ACT IN AERS   Inhalation   Inhale 2 puffs into the lungs every 6 (six) hours as needed. 2 puffs every 6-8 hours as needed for wheezing          . IBUPROFEN 200 MG PO TABS   Oral   Take 600 mg by mouth daily as needed. For pain           BP 140/83  Pulse 75  Temp 98.2 F (36.8 C) (Oral)  Resp 18  Ht 5' (1.524 m)  Wt 140 lb (63.504 kg)  BMI 27.34 kg/m2  SpO2 100%  LMP 05/01/2012  Physical Exam  Nursing note and vitals reviewed. Constitutional: She is oriented to person, place, and time. She appears well-developed and well-nourished.  Non-toxic appearance.  HENT:  Head: Normocephalic.  Right Ear: Tympanic membrane and external ear normal.  Left Ear: Tympanic membrane and external ear normal.  Eyes: EOM and lids are normal. Pupils are equal, round, and reactive to light.  Neck: Normal range of motion. Neck supple. Carotid bruit is not present.  Cardiovascular: Normal rate, regular rhythm, normal heart sounds, intact distal pulses and normal pulses.   Pulmonary/Chest: Breath sounds normal. No respiratory distress.  Abdominal: Soft. Bowel sounds are normal. There is no tenderness. There is no guarding.  Musculoskeletal: Normal range of motion.  Lymphadenopathy:       Head (right side): No submandibular adenopathy present.       Head (left side):  No submandibular adenopathy present.    She has no cervical adenopathy.  Neurological: She is alert and oriented to person, place, and time. She has normal strength. No cranial nerve deficit or sensory deficit. She exhibits normal muscle tone. Coordination normal.  Skin: Skin is warm and dry.  Psychiatric: She has a normal mood and affect. Her speech is normal.    ED Course  Procedures (including critical care time)  Labs Reviewed - No data to display No results found. Pulse oximetry 100% on room air. Within normal limits by my interpretation.  No diagnosis found.    MDM  I have reviewed nursing notes, vital signs, and all appropriate lab and imaging results for this patient.   headache improving after intramuscular morphine. No changes in examination. No gross neurologic deficits appreciated. Patient given a prescription for Norco, and promethazine. Patient to see her primary physician or return to the emergency department if any changes, problems, or concerns.  Kathie Dike, [place] 05/09/12 [phone]

## 2012-05-10 NOTE — ED Provider Notes (Signed)
Medical screening examination/treatment/procedure(s) were performed by non-physician practitioner and as supervising physician I was immediately available for consultation/collaboration.   Ciro Tashiro M Rise Traeger, DO 05/10/12 1943 

## 2012-09-03 ENCOUNTER — Ambulatory Visit: Payer: Self-pay | Admitting: Family Medicine

## 2012-09-18 ENCOUNTER — Ambulatory Visit (INDEPENDENT_AMBULATORY_CARE_PROVIDER_SITE_OTHER): Payer: Self-pay | Admitting: Family Medicine

## 2012-09-18 ENCOUNTER — Other Ambulatory Visit (HOSPITAL_COMMUNITY)
Admission: RE | Admit: 2012-09-18 | Discharge: 2012-09-18 | Disposition: A | Discharge status: Home / Self Care | Payer: Self-pay | Source: Ambulatory Visit | Attending: Family Medicine | Admitting: Family Medicine

## 2012-09-18 ENCOUNTER — Encounter: Payer: Self-pay | Admitting: Family Medicine

## 2012-09-18 VITALS — BP 134/82 | HR 74 | Resp 18 | Ht 60.0 in | Wt 152.1 lb

## 2012-09-18 DIAGNOSIS — Z1151 Encounter for screening for human papillomavirus (HPV): Secondary | ICD-10-CM | POA: Insufficient documentation

## 2012-09-18 DIAGNOSIS — Z113 Encounter for screening for infections with a predominantly sexual mode of transmission: Secondary | ICD-10-CM

## 2012-09-18 DIAGNOSIS — N760 Acute vaginitis: Secondary | ICD-10-CM | POA: Insufficient documentation

## 2012-09-18 DIAGNOSIS — R5383 Other fatigue: Secondary | ICD-10-CM

## 2012-09-18 DIAGNOSIS — A599 Trichomoniasis, unspecified: Secondary | ICD-10-CM

## 2012-09-18 DIAGNOSIS — E559 Vitamin D deficiency, unspecified: Secondary | ICD-10-CM

## 2012-09-18 DIAGNOSIS — Z01419 Encounter for gynecological examination (general) (routine) without abnormal findings: Secondary | ICD-10-CM | POA: Insufficient documentation

## 2012-09-18 DIAGNOSIS — F329 Major depressive disorder, single episode, unspecified: Secondary | ICD-10-CM

## 2012-09-18 DIAGNOSIS — Z139 Encounter for screening, unspecified: Secondary | ICD-10-CM

## 2012-09-18 DIAGNOSIS — R5381 Other malaise: Secondary | ICD-10-CM

## 2012-09-18 DIAGNOSIS — Z0000 Encounter for general adult medical examination without abnormal findings: Secondary | ICD-10-CM

## 2012-09-18 DIAGNOSIS — F32A Depression, unspecified: Secondary | ICD-10-CM

## 2012-09-18 MED ORDER — FLUOXETINE HCL 10 MG PO CAPS: 10.0000 mg | ORAL_CAPSULE | Freq: Every day | ORAL | Status: DC

## 2012-09-18 NOTE — Patient Instructions (Addendum)
F/u in 3 month    Fasting CBC, lipid, chem 7, TSH , vit D today   Please commit to walking 30 minutes every day.  Stop drinking sodas and sweetened drinks..  Commit to losing weight  New is fluoxetine once daily  Go back to [facility]

## 2012-09-18 NOTE — Progress Notes (Signed)
  Subjective:    Patient ID: Rebecca Boyd, female    DOB: February 08, 1976, 37 y.o.   MRN: [id number]  HPI The PT is here for annual exam and re-evaluation of chronic medical conditions, medication management and review of any available recent lab and radiology data.  Preventive health is updated, specifically  Cancer screening and Immunization.   C/o malodorous vaginal d/c x several weeks wants STD testing C/o uncontrolled and increased depression and stress, recently started working so expects this to improve, request medication. Not suicidal or , denies halluciinations     Review of Systems See HPI Denies recent fever or chills. Denies sinus pressure, nasal congestion, ear pain or sore throat. Denies chest congestion, productive cough or wheezing. Denies chest pains, palpitations and leg swelling Denies abdominal pain, nausea, vomiting,diarrhea or constipation.   Denies dysuria, frequency, hesitancy or incontinence. Denies joint pain, swelling and limitation in mobility. Denies headaches, seizures, numbness, or tingling.  Denies skin break down or rash.        Objective:   Physical Exam  Pleasant well nourished female, alert and oriented x 3, in no cardio-pulmonary distress. Afebrile. HEENT No facial trauma or asymetry. Sinuses non tender.  EOMI, PERTL, fundoscopic exam is normal, no hemorhage or exudate.  External ears normal, tympanic membranes clear. Oropharynx moist, no exudate, fair  dentition. Neck: supple, no adenopathy,JVD or thyromegaly.No bruits.  Chest: Clear to ascultation bilaterally.No crackles or wheezes. Non tender to palpation  Breast: No asymetry,no masses. No nipple discharge or inversion. No axillary or supraclavicular adenopathy  Cardiovascular system; Heart sounds normal,  S1 and  S2 ,no S3.  No murmur, or thrill. Apical beat not displaced Peripheral pulses normal.  Abdomen: Soft, non tender, no organomegaly or masses. No bruits. Bowel  sounds normal. No guarding, tenderness or rebound.  GU: External genitalia normal. No lesions. Vaginal canal normal.yelloow foul smelling  discharge. Uterus normal size, no adnexal masses, no cervical motion or adnexal tenderness.  Musculoskeletal exam: Full ROM of spine, hips , shoulders and knees. No deformity ,swelling or crepitus noted. No muscle wasting or atrophy.   Neurologic: Cranial nerves 2 to 12 intact. Power, tone ,sensation and reflexes normal throughout. No disturbance in gait. No tremor.  Skin: Intact, no ulceration, erythema , scaling or rash noted. Pigmentation normal throughout  Psych; Normal mood and affect. Judgement and concentration normal       Assessment & Plan:

## 2012-09-20 ENCOUNTER — Encounter: Payer: Self-pay | Admitting: Family Medicine

## 2012-09-20 DIAGNOSIS — A5900 Urogenital trichomoniasis, unspecified: Secondary | ICD-10-CM | POA: Insufficient documentation

## 2012-09-20 MED ORDER — METRONIDAZOLE 500 MG PO TABS: 500.0000 mg | ORAL_TABLET | Freq: Two times a day (BID) | ORAL | Status: DC

## 2012-09-28 DIAGNOSIS — Z0000 Encounter for general adult medical examination without abnormal findings: Secondary | ICD-10-CM | POA: Insufficient documentation

## 2012-09-28 NOTE — Assessment & Plan Note (Signed)
Physical exam as documented. Vaginal specimens sent for testing for STd and counseled re use of condoms. Needs to commit to healthy diet regular exercise and weight loss

## 2012-09-28 NOTE — Assessment & Plan Note (Signed)
Uncontrolled, though not suicidal or homicidal, needs to resume medication, pt to [facility] FLUOXETINE[facility] prozac

## 2012-10-23 ENCOUNTER — Telehealth: Payer: Self-pay

## 2012-10-23 NOTE — Telephone Encounter (Signed)
States period has been on for 2 weeks and she was wanting something to make it stop. I advised her try the urgenet care if she couldn't wait for Dr to come back in office and she said she would try the UC

## 2012-11-14 ENCOUNTER — Encounter: Payer: Self-pay | Admitting: Family Medicine

## 2012-11-14 ENCOUNTER — Ambulatory Visit (INDEPENDENT_AMBULATORY_CARE_PROVIDER_SITE_OTHER): Payer: Self-pay | Admitting: Family Medicine

## 2012-11-14 ENCOUNTER — Other Ambulatory Visit (HOSPITAL_COMMUNITY)
Admission: RE | Admit: 2012-11-14 | Discharge: 2012-11-14 | Disposition: A | Discharge status: Home / Self Care | Payer: Self-pay | Source: Ambulatory Visit | Attending: Family Medicine | Admitting: Family Medicine

## 2012-11-14 VITALS — BP 122/80 | HR 82 | Resp 16 | Ht 60.0 in | Wt 151.0 lb

## 2012-11-14 DIAGNOSIS — G44001 Cluster headache syndrome, unspecified, intractable: Secondary | ICD-10-CM | POA: Insufficient documentation

## 2012-11-14 DIAGNOSIS — N920 Excessive and frequent menstruation with regular cycle: Secondary | ICD-10-CM

## 2012-11-14 DIAGNOSIS — Z139 Encounter for screening, unspecified: Secondary | ICD-10-CM

## 2012-11-14 DIAGNOSIS — J4520 Mild intermittent asthma, uncomplicated: Secondary | ICD-10-CM

## 2012-11-14 DIAGNOSIS — R51 Headache: Secondary | ICD-10-CM

## 2012-11-14 DIAGNOSIS — F32A Depression, unspecified: Secondary | ICD-10-CM

## 2012-11-14 DIAGNOSIS — E559 Vitamin D deficiency, unspecified: Secondary | ICD-10-CM

## 2012-11-14 DIAGNOSIS — N760 Acute vaginitis: Secondary | ICD-10-CM | POA: Insufficient documentation

## 2012-11-14 DIAGNOSIS — Z113 Encounter for screening for infections with a predominantly sexual mode of transmission: Secondary | ICD-10-CM | POA: Insufficient documentation

## 2012-11-14 DIAGNOSIS — A599 Trichomoniasis, unspecified: Secondary | ICD-10-CM

## 2012-11-14 DIAGNOSIS — F329 Major depressive disorder, single episode, unspecified: Secondary | ICD-10-CM

## 2012-11-14 DIAGNOSIS — Z1211 Encounter for screening for malignant neoplasm of colon: Secondary | ICD-10-CM

## 2012-11-14 DIAGNOSIS — J45909 Unspecified asthma, uncomplicated: Secondary | ICD-10-CM

## 2012-11-14 DIAGNOSIS — N771 Vaginitis, vulvitis and vulvovaginitis in diseases classified elsewhere: Secondary | ICD-10-CM

## 2012-11-14 MED ORDER — IBUPROFEN 800 MG PO TABS: 800.0000 mg | ORAL_TABLET | Freq: Three times a day (TID) | ORAL | Status: DC | PRN

## 2012-11-14 MED ORDER — FLUOXETINE HCL 20 MG PO CAPS: 20.0000 mg | ORAL_CAPSULE | Freq: Every day | ORAL | Status: DC

## 2012-11-14 NOTE — Patient Instructions (Addendum)
F/u in 3.5 month,call if you need me before  Fasting lipid, chem 7, hBA1C, TSH, RPR, HIV, Vit D, HSV2 and TSH and CBc today  You will be contacted with results of blood work and swabs  You are referred to [facility] for help with excessive bleeding   Dose increase in Fluoxetine to 20mg  daily, OK to take TWO 10mg  capsules daily till done

## 2012-11-16 NOTE — Assessment & Plan Note (Signed)
Safe sex with condom use discussed and encouraged, specimens sent for testing , will treat based on results

## 2012-11-16 NOTE — Assessment & Plan Note (Signed)
Ongoing problem, unesponsive to hormonal manipulation, a good candidate for endometrial ablation, no desire to conceive, will refer

## 2012-11-16 NOTE — Assessment & Plan Note (Signed)
frequency approx twice weekly, responds to ibuprofen ,not disabling, continue same

## 2012-11-16 NOTE — Assessment & Plan Note (Signed)
Recently treated, states partner treated, currently symptomatic, rept testing

## 2012-11-16 NOTE — Assessment & Plan Note (Signed)
Improved, but still inadequately treated, increase fluoxetine dose Pt not suicidal or homicidal, denies hallucinations

## 2012-11-16 NOTE — Progress Notes (Signed)
  Subjective:    Patient ID: Rebecca Boyd, female    DOB: 1975/05/04, 37 y.o.   MRN: [id number]  HPI  The PT is here for follow up and re-evaluation of chronic medical conditions,  Still has not had lab work ordered. C/o increased vaginal discharge and itch in the past week, states not sxually active since diagnosed with trich, that her partner of 15 years has been treated, and se has ended the relationship States her depression is improved , but is still suffering from symptoms of depression and anxiety, not suicidal or homicidal Though she has a diagnosis of asthma since childhood, no acute flares in recent years Continues to bleed on avg 2 weeks per month, had no response to OCP and depo made it worse , wants definitivetreatment, has chronic fatigue , and when last checked  she was anemic     Review of Systems See HPI Denies recent fever or chills. Denies sinus pressure, nasal congestion, ear pain or sore throat. Denies chest congestion, productive cough or wheezing. Denies chest pains, palpitations and leg swelling Denies abdominal pain, nausea, vomiting,diarrhea or constipation.   Denies dysuria, frequency, hesitancy or incontinence. Denies joint pain, swelling and limitation in mobility. Denies headaches, seizures, numbness, or tingling. Denies skin break down or rash.        Objective:   Physical Exam  Patient alert and oriented and in no cardiopulmonary distress.  HEENT: No facial asymmetry, EOMI, no sinus tenderness,  oropharynx pink and moist.  Neck supple no adenopathy.  Chest: Clear to auscultation bilaterally.  CVS: S1, S2 no murmurs, no S3.  ABD: Soft non tender. Bowel sounds normal. Pelvic: fishy white d/c , no ulcers, no cervical motion or adnexal  tenderness Ext: No edema  MS: Adequate ROM spine, shoulders, hips and knees.  Skin: Intact, no ulcerations or rash noted.  Psych: Good eye contact, flat  affect. Memory intact not anxious mildly   depressed appearing.  CNS: CN 2-12 intact, power, tone and sensation normal throughout.       Assessment & Plan:

## 2012-11-16 NOTE — Assessment & Plan Note (Signed)
Stable, no flare in the past 3 month, as needed, albuterol only

## 2012-11-20 ENCOUNTER — Other Ambulatory Visit: Payer: Self-pay

## 2012-11-20 DIAGNOSIS — N920 Excessive and frequent menstruation with regular cycle: Secondary | ICD-10-CM

## 2012-11-20 DIAGNOSIS — A599 Trichomoniasis, unspecified: Secondary | ICD-10-CM

## 2012-11-20 DIAGNOSIS — N771 Vaginitis, vulvitis and vulvovaginitis in diseases classified elsewhere: Secondary | ICD-10-CM

## 2012-11-20 DIAGNOSIS — R51 Headache: Secondary | ICD-10-CM

## 2012-11-20 DIAGNOSIS — E559 Vitamin D deficiency, unspecified: Secondary | ICD-10-CM

## 2012-11-20 DIAGNOSIS — Z113 Encounter for screening for infections with a predominantly sexual mode of transmission: Secondary | ICD-10-CM

## 2012-11-20 DIAGNOSIS — F329 Major depressive disorder, single episode, unspecified: Secondary | ICD-10-CM

## 2012-11-20 DIAGNOSIS — F32A Depression, unspecified: Secondary | ICD-10-CM

## 2012-11-20 DIAGNOSIS — Z1211 Encounter for screening for malignant neoplasm of colon: Secondary | ICD-10-CM

## 2012-11-20 DIAGNOSIS — J4520 Mild intermittent asthma, uncomplicated: Secondary | ICD-10-CM

## 2012-11-20 DIAGNOSIS — Z139 Encounter for screening, unspecified: Secondary | ICD-10-CM

## 2012-11-20 MED ORDER — METRONIDAZOLE 500 MG PO TABS: 500.0000 mg | ORAL_TABLET | Freq: Two times a day (BID) | ORAL | Status: DC

## 2012-12-03 ENCOUNTER — Encounter: Payer: Self-pay | Admitting: Obstetrics and Gynecology

## 2013-05-29 ENCOUNTER — Encounter: Payer: Self-pay | Admitting: Family Medicine

## 2013-05-29 ENCOUNTER — Telehealth: Payer: Self-pay | Admitting: Family Medicine

## 2013-05-29 ENCOUNTER — Ambulatory Visit (INDEPENDENT_AMBULATORY_CARE_PROVIDER_SITE_OTHER): Payer: 59 | Admitting: Family Medicine

## 2013-05-29 ENCOUNTER — Other Ambulatory Visit (HOSPITAL_COMMUNITY)
Admission: RE | Admit: 2013-05-29 | Discharge: 2013-05-29 | Disposition: A | Discharge status: Home / Self Care | Payer: 59 | Source: Ambulatory Visit | Attending: Family Medicine | Admitting: Family Medicine

## 2013-05-29 ENCOUNTER — Other Ambulatory Visit: Payer: Self-pay | Admitting: Family Medicine

## 2013-05-29 ENCOUNTER — Encounter (INDEPENDENT_AMBULATORY_CARE_PROVIDER_SITE_OTHER): Payer: Self-pay

## 2013-05-29 VITALS — BP 120/78 | HR 86 | Resp 18 | Wt 146.1 lb

## 2013-05-29 DIAGNOSIS — F419 Anxiety disorder, unspecified: Secondary | ICD-10-CM

## 2013-05-29 DIAGNOSIS — Z1211 Encounter for screening for malignant neoplasm of colon: Secondary | ICD-10-CM

## 2013-05-29 DIAGNOSIS — E559 Vitamin D deficiency, unspecified: Secondary | ICD-10-CM

## 2013-05-29 DIAGNOSIS — N760 Acute vaginitis: Secondary | ICD-10-CM | POA: Insufficient documentation

## 2013-05-29 DIAGNOSIS — Z113 Encounter for screening for infections with a predominantly sexual mode of transmission: Secondary | ICD-10-CM | POA: Insufficient documentation

## 2013-05-29 DIAGNOSIS — R768 Other specified abnormal immunological findings in serum: Secondary | ICD-10-CM

## 2013-05-29 DIAGNOSIS — R51 Headache: Secondary | ICD-10-CM

## 2013-05-29 DIAGNOSIS — N750 Cyst of Bartholin's gland: Secondary | ICD-10-CM

## 2013-05-29 DIAGNOSIS — F3289 Other specified depressive episodes: Secondary | ICD-10-CM

## 2013-05-29 DIAGNOSIS — R894 Abnormal immunological findings in specimens from other organs, systems and tissues: Secondary | ICD-10-CM

## 2013-05-29 DIAGNOSIS — F32A Depression, unspecified: Secondary | ICD-10-CM

## 2013-05-29 DIAGNOSIS — J45909 Unspecified asthma, uncomplicated: Secondary | ICD-10-CM

## 2013-05-29 DIAGNOSIS — Z139 Encounter for screening, unspecified: Secondary | ICD-10-CM

## 2013-05-29 DIAGNOSIS — F329 Major depressive disorder, single episode, unspecified: Secondary | ICD-10-CM

## 2013-05-29 DIAGNOSIS — F411 Generalized anxiety disorder: Secondary | ICD-10-CM

## 2013-05-29 DIAGNOSIS — J4520 Mild intermittent asthma, uncomplicated: Secondary | ICD-10-CM

## 2013-05-29 DIAGNOSIS — N771 Vaginitis, vulvitis and vulvovaginitis in diseases classified elsewhere: Secondary | ICD-10-CM

## 2013-05-29 DIAGNOSIS — N920 Excessive and frequent menstruation with regular cycle: Secondary | ICD-10-CM

## 2013-05-29 LAB — BASIC METABOLIC PANEL
BUN: 13 mg/dL (ref 6–23)
CALCIUM: 9 mg/dL (ref 8.4–10.5)
CHLORIDE: 106 meq/L (ref 96–112)
CO2: 21 mEq/L (ref 19–32)
CREATININE: 0.65 mg/dL (ref 0.50–1.10)
Glucose, Bld: 77 mg/dL (ref 70–99)
Potassium: 4.3 mEq/L (ref 3.5–5.3)
Sodium: 136 mEq/L (ref 135–145)

## 2013-05-29 LAB — LIPID PANEL
Cholesterol: 146 mg/dL (ref 0–200)
HDL: 46 mg/dL (ref >39)
LDL CALC: 88 mg/dL (ref 0–99)
TRIGLYCERIDES: 60 mg/dL (ref <150)
Total CHOL/HDL Ratio: 3.2 Ratio
VLDL: 12 mg/dL (ref 0–40)

## 2013-05-29 MED ORDER — FLUOXETINE HCL 20 MG PO CAPS: 20.0000 mg | ORAL_CAPSULE | Freq: Every day | ORAL | Status: DC

## 2013-05-29 MED ORDER — ALPRAZOLAM 1 MG PO TABS: ORAL_TABLET | ORAL | Status: DC

## 2013-05-29 MED ORDER — ALBUTEROL SULFATE HFA 108 (90 BASE) MCG/ACT IN AERS: 2.0000 | INHALATION_SPRAY | Freq: Four times a day (QID) | RESPIRATORY_TRACT | Status: DC | PRN

## 2013-05-29 NOTE — Patient Instructions (Signed)
F/u in 4.5 month, call if you need me before  You are referred to [facility] re bleeding and cyst   Commit to regular exercise and low fat , reduced sugar diet, you need to lose weight, aim for  2 pounds per month  New to help with sleep and anxiety is xanax at bedtime onlly   Continue other medication as before  Fasting labs today you will get a new sheet of labs previously ordered  Reconsider the flu vaccine you need this

## 2013-05-29 NOTE — Telephone Encounter (Signed)
Printed for Dr to sign

## 2013-05-30 ENCOUNTER — Other Ambulatory Visit: Payer: Self-pay

## 2013-05-30 DIAGNOSIS — E559 Vitamin D deficiency, unspecified: Secondary | ICD-10-CM

## 2013-05-30 DIAGNOSIS — R768 Other specified abnormal immunological findings in serum: Secondary | ICD-10-CM

## 2013-05-30 DIAGNOSIS — B009 Herpesviral infection, unspecified: Secondary | ICD-10-CM | POA: Insufficient documentation

## 2013-05-30 DIAGNOSIS — N771 Vaginitis, vulvitis and vulvovaginitis in diseases classified elsewhere: Secondary | ICD-10-CM

## 2013-05-30 HISTORY — DX: Other specified abnormal immunological findings in serum: R76.8

## 2013-05-30 LAB — RPR

## 2013-05-30 LAB — HIV ANTIBODY (ROUTINE TESTING W REFLEX): HIV: NONREACTIVE

## 2013-05-30 LAB — TSH: TSH: 2.072 u[IU]/mL (ref 0.350–4.500)

## 2013-05-30 LAB — VITAMIN D 25 HYDROXY (VIT D DEFICIENCY, FRACTURES): VIT D 25 HYDROXY: 11 ng/mL — AB (ref 30–89)

## 2013-05-30 LAB — HSV 2 ANTIBODY, IGG: HSV 2 GLYCOPROTEIN G AB, IGG: 20.12 IV — AB

## 2013-05-30 LAB — HEMOGLOBIN A1C
Hgb A1c MFr Bld: 5.4 % (ref <5.7)
Mean Plasma Glucose: 108 mg/dL (ref <117)

## 2013-05-30 MED ORDER — METRONIDAZOLE 500 MG PO TABS: 500.0000 mg | ORAL_TABLET | Freq: Two times a day (BID) | ORAL | Status: DC

## 2013-05-30 MED ORDER — ACYCLOVIR 200 MG PO CAPS: 200.0000 mg | ORAL_CAPSULE | Freq: Every day | ORAL | Status: DC

## 2013-05-30 MED ORDER — ERGOCALCIFEROL 1.25 MG (50000 UT) PO CAPS: 50000.0000 [IU] | ORAL_CAPSULE | ORAL | Status: DC

## 2013-06-01 DIAGNOSIS — N750 Cyst of Bartholin's gland: Secondary | ICD-10-CM | POA: Insufficient documentation

## 2013-06-01 NOTE — Assessment & Plan Note (Signed)
Major c/o poor sleep and anxiety, primarily being stressed about caring for her mother who is alcoholic Will add xanax for bedtime use and continue same dose of prozac

## 2013-06-01 NOTE — Assessment & Plan Note (Signed)
New dx needs initial therapy then suppressive therapy

## 2013-06-01 NOTE — Assessment & Plan Note (Signed)
Recurrent symptoms, impoortance of condom use is stressed, will treat based on results

## 2013-06-01 NOTE — Assessment & Plan Note (Signed)
Gyne to eval and treat, size is enlarging and bothersome to pt

## 2013-06-01 NOTE — Assessment & Plan Note (Signed)
stable , very intermittent need for albutrerol, none in past 3 months

## 2013-06-01 NOTE — Assessment & Plan Note (Signed)
Uncontrolled add bed time xanax

## 2013-06-01 NOTE — Assessment & Plan Note (Signed)
Pt treated with flagyll and notified that partner need trreatment. Use of condoms re emphasized. GC , Chlamydia and HIV tests are negative

## 2013-06-01 NOTE — Progress Notes (Signed)
   Subjective:    Patient ID: Rebecca Boyd, female    DOB: 08-14-1975, 38 y.o.   MRN: [id number]  HPI The PT is here for follow up and re-evaluation of chronic medical conditions, medication management and review of any available recent lab and radiology data.  Preventive health is updated, specifically  Cancer screening and Immunization.  Refuses flu vaccine Still needs to see gyne for excessive bleeding,and  Has new concern of enlarging cyst on vulva The PT denies any adverse reactions to current medications since the last visit.  Malodorous vaginal d/c with stinging in the past 3 weeks, no h/o ulceration. C/o uncontrolled anxiety and ready anger which she attributes to poor sleep, states her Mom who lives with her makes it difficult to rest , as she is constantly talking       Review of Systems See HPI Denies recent fever or chills. Denies sinus pressure, nasal congestion, ear pain or sore throat. Denies chest congestion, productive cough or wheezing. Denies chest pains, palpitations and leg swelling Denies abdominal pain, nausea, vomiting,diarrhea or constipation.   Denies dysuria, frequency, hesitancy or incontinence. Denies joint pain, swelling and limitation in mobility. Denies headaches, seizures, numbness, or tingling. C/o depression increased , anxiety and  insomnia.Wants xanax , which she has used in the past to help with her nerves and allow good sleep. She is not suicidal or homicidal and denies hallucinations       Objective:   Physical Exam  Patient alert and oriented and in no cardiopulmonary distress.  HEENT: No facial asymmetry, EOMI, no sinus tenderness,  oropharynx pink and moist.  Neck supple no adenopathy.  Chest: Clear to auscultation bilaterally.  CVS: S1, S2 no murmurs, no S3.  ABD: Soft non tender. Bowel sounds normal. Pelvic: uterus normal size, no adnexal masses, no uterine or adnexal tenderness, malodorous d/c, no ulcers seen, cyst on right  inner labia majors approx pea sized, non tender Ext: No edema  MS: Adequate ROM spine, shoulders, hips and knees.  Skin: Intact, no ulcerations or rash noted.  Psych: Good eye contact, normal affect. Memory intact not anxious or depressed appearing.  CNS: CN 2-12 intact, power, tone and sensation normal throughout.       Assessment & Plan:

## 2013-06-01 NOTE — Assessment & Plan Note (Signed)
Recurrent flooding and clotting, gyne eval, also ha bartholin's cyst , will have eval of that also

## 2013-06-12 ENCOUNTER — Encounter: Payer: Self-pay | Admitting: Obstetrics and Gynecology

## 2013-06-12 ENCOUNTER — Ambulatory Visit (INDEPENDENT_AMBULATORY_CARE_PROVIDER_SITE_OTHER): Payer: 59 | Admitting: Obstetrics and Gynecology

## 2013-06-12 VITALS — BP 120/58 | Ht 60.0 in | Wt 148.6 lb

## 2013-06-12 DIAGNOSIS — N9489 Other specified conditions associated with female genital organs and menstrual cycle: Secondary | ICD-10-CM

## 2013-06-12 DIAGNOSIS — N920 Excessive and frequent menstruation with regular cycle: Secondary | ICD-10-CM

## 2013-06-12 DIAGNOSIS — N979 Female infertility, unspecified: Secondary | ICD-10-CM

## 2013-06-12 DIAGNOSIS — N907 Vulvar cyst: Secondary | ICD-10-CM

## 2013-06-12 NOTE — Progress Notes (Signed)
This chart was transcribed for Dr. Mallory Shirk by Ludger Nutting, ED scribe.    [facility] Visit  Patient name: Rebecca Boyd MRN [id number]  Date of birth: 1975-10-06  CC & HPI:  [name] is a 38 y.o. female presenting today for menorrhagia. She states her LMP began 06/01/13 and lasted for 7-8 days. She states they sometimes up to 15 days.   She also complains of a vulvar lesion. , a bump on left labia  Last pap smear in July 2014  ROS:  Negative except listed above.   Pertinent History Reviewed:  Medical & Surgical Hx:  Reviewed: Significant for   Past Medical History  Diagnosis Date  . Depressive disorder, not elsewhere classified   . Neck pain   . Vaginitis     cyctitis  . Miscarriage 2009  . Anemia     Past Surgical History  Procedure Laterality Date  . Cervical cryotherapy N/A 1999    Medications: Reviewed & Updated - see associated section Social History: Reviewed -  reports that she has quit smoking. Her smoking use included Cigarettes. She smoked 0.00 packs per day. She has never used smokeless tobacco.  Objective Findings:  Vitals: BP 120/58  Ht 5' (1.524 m)  Wt 148 lb 9.6 oz (67.405 kg)  BMI 29.02 kg/m2  LMP 06/01/2013  Physical Examination: General appearance - alert, well appearing, and in no distress and oriented to person, place, and time Pelvic - VULVA: normal appearing vulva with no masses, tenderness or lesions, sebaceous cyst to the left labia,  VAGINA: normal appearing vagina with normal color and discharge, no lesions,  CERVIX: normal appearing cervix without discharge or lesions,  UTERUS: uterus is normal size, shape, consistency and nontender, anteverted, mobile, suspected anterior fundal fibroid about 2 cm,  ADNEXA: : normal adnexa in size, nontender and no masses  SEE U/S FROM 2013,JAN Clinical Data: Bilateral pelvic pain. LMP 05/20/2011. History of  uterine leiomyoma.  TRANSABDOMINAL AND TRANSVAGINAL ULTRASOUND OF  PELVIS  Technique: Both transabdominal and transvaginal ultrasound  examinations of the pelvis were performed. Transabdominal technique  was performed for global imaging of the pelvis including uterus,  ovaries, adnexal regions, and pelvic cul-de-sac.  Comparison: 03/07/2010.  It was necessary to proceed with endovaginal exam following the  transabdominal exam to visualize the details of the parenchyma of  the myometrium, endometrial, and ovaries.  Findings:  Uterus: Uterus measures 7.9 x 3.4 x 4.3 cm. Small intramural area  is consistent with uterine leiomyoma measuring 0.8 x 0.8 x 0.8 cm.  On previous study it measured 0.7 x 0.6 x 0.5 cm.  Endometrium: Endometrial stripe thickness is 6.8 mm. No  endometrial mass or fluid collection is seen.  Right ovary: Right ovary measures 4.4 x 2.3 x 3.4 cm. There is a  dominant follicle with a greatest diameter 1.6 cm.  Left ovary: . Left ovary measures 4.0 x 2.9 x 3.2 cm. No left  ovarian lesion is seen.  Other findings: No free fluid is seen in the pelvis. No  hydrosalpinx is evident.  IMPRESSION:  Normal size uterus. Small intramural uterine leiomyomas present.  No endometrial lesion is seen. Ovaries upper normal size. Small  follicular cysts are seen with dominant follicle on the right.  Original Report Authenticated By: Delane Ginger, M.D.    Assessment & Plan:   A: 1. Sebaceous cyst to left labia 2. Menorrhagia  3. ? uterine fibroid  P: 1. . Discussed with pt current considering  endometrial ablation. Will discuss salpingectomy at same time. Remove cyst at time of surgery  Pt to return 1 wk to make final surgery plans, and finalize yes/no for salpingectomy

## 2013-06-12 NOTE — Patient Instructions (Signed)
Endometrial Ablation Endometrial ablation removes the lining of the uterus (endometrium). It is usually a same-day, outpatient treatment. Ablation helps avoid major surgery, such as surgery to remove the cervix and uterus (hysterectomy). After endometrial ablation, you will have little or no menstrual bleeding and may not be able to have children. However, if you are premenopausal, you will need to use a reliable method of birth control following the procedure because of the small chance that pregnancy can occur. There are different reasons to have this procedure, which include:  Heavy periods.  Bleeding that is causing anemia.  Irregular bleeding.  Bleeding fibroids on the lining inside the uterus if they are smaller than 3 centimeters. This procedure should not be done if:  You want children in the future.  You have severe cramps with your menstrual period.  You have precancerous or cancerous cells in your uterus.  You were recently pregnant.  You have gone through menopause.  You have had major surgery on the uterus, such as a cesarean delivery. LET [facility] CARE PROVIDER KNOW ABOUT:  Any allergies you have.  All medicines you are taking, including vitamins, herbs, eye drops, creams, and over-the-counter medicines.  Previous problems you or members of your family have had with the use of anesthetics.  Any blood disorders you have.  Previous surgeries you have had.  Medical conditions you have. RISKS AND COMPLICATIONS  Generally, this is a safe procedure. However, as with any procedure, complications can occur. Possible complications include:  Perforation of the uterus.  Bleeding.  Infection of the uterus, bladder, or vagina.  Injury to surrounding organs.  An air bubble to the lung (air embolus).  Pregnancy following the procedure.  Failure of the procedure to help the problem, requiring hysterectomy.  Decreased ability to diagnose cancer in the lining of  the uterus. BEFORE THE PROCEDURE  The lining of the uterus must be tested to make sure there is no pre-cancerous or cancer cells present.  An ultrasound may be performed to look at the size of the uterus and to check for abnormalities.  Medicines may be given to thin the lining of the uterus. PROCEDURE  During the procedure, your health care provider will use a tool called a resectoscope to help see inside your uterus. There are different ways to remove the lining of your uterus.   Radiofrequency  This method uses a radiofrequency-alternating electric current to remove the lining of the uterus.  Cryotherapy This method uses extreme cold to freeze the lining of the uterus.  Heated-Free Liquid  This method uses heated salt (saline) solution to remove the lining of the uterus.  Microwave This method uses high-energy microwaves to heat up the lining of the uterus to remove it.  Thermal balloon  This method involves inserting a catheter with a balloon tip into the uterus. The balloon tip is filled with heated fluid to remove the lining of the uterus. AFTER THE PROCEDURE  After your procedure, do not have sexual intercourse or insert anything into your vagina until permitted by your health care provider. After the procedure, you may experience:  Cramps.  Vaginal discharge.  Frequent urination. Document Released: 02/25/2004 Document Revised: 12/18/2012 Document Reviewed: 09/18/2012 [facility] Patient Information 2014 [place]. Epidermal Cyst An epidermal cyst is sometimes called a sebaceous cyst, epidermal inclusion cyst, or infundibular cyst. These cysts usually contain a substance that looks "pasty" or "cheesy" and may have a bad smell. This substance is a protein called keratin. Epidermal cysts are  usually found on the face, neck, or trunk. They may also occur in the vaginal area or other parts of the genitalia of both men and women. Epidermal cysts are usually small, painless,  slow-growing bumps or lumps that move freely under the skin. It is important not to try to pop them. This may cause an infection and lead to tenderness and swelling. CAUSES  Epidermal cysts may be caused by a deep penetrating injury to the skin or a plugged hair follicle, often associated with acne. SYMPTOMS  Epidermal cysts can become inflamed and cause:  Redness.  Tenderness.  Increased temperature of the skin over the bumps or lumps.  Grayish-white, bad smelling material that drains from the bump or lump. DIAGNOSIS  Epidermal cysts are easily diagnosed by your caregiver during an exam. Rarely, a tissue sample (biopsy) may be taken to rule out other conditions that may resemble epidermal cysts. TREATMENT   Epidermal cysts often get better and disappear on their own. They are rarely ever cancerous.  If a cyst becomes infected, it may become inflamed and tender. This may require opening and draining the cyst. Treatment with antibiotics may be necessary. When the infection is gone, the cyst may be removed with minor surgery.  Small, inflamed cysts can often be treated with antibiotics or by injecting steroid medicines.  Sometimes, epidermal cysts become large and bothersome. If this happens, surgical removal in your caregiver's office may be necessary. HOME CARE INSTRUCTIONS  Only take over-the-counter or prescription medicines as directed by your caregiver.  Take your antibiotics as directed. Finish them even if you start to feel better. SEEK MEDICAL CARE IF:   Your cyst becomes tender, red, or swollen.  Your condition is not improving or is getting worse.  You have any other questions or concerns. MAKE SURE YOU:  Understand these instructions.  Will watch your condition.  Will get help right away if you are not doing well or get worse. Document Released: 03/18/2004 Document Revised: 07/10/2011 Document Reviewed: 10/24/2010 [facility] Patient Information 2014 [place],  [place].

## 2013-06-24 ENCOUNTER — Encounter: Payer: 59 | Admitting: Obstetrics and Gynecology

## 2013-06-29 ENCOUNTER — Emergency Department (HOSPITAL_COMMUNITY): Payer: 59

## 2013-06-29 ENCOUNTER — Encounter (HOSPITAL_COMMUNITY): Payer: Self-pay | Admitting: Emergency Medicine

## 2013-06-29 ENCOUNTER — Emergency Department (HOSPITAL_COMMUNITY)
Admission: EM | Admit: 2013-06-29 | Discharge: 2013-06-29 | Disposition: A | Discharge status: Home / Self Care | Payer: 59 | Attending: Emergency Medicine | Admitting: Emergency Medicine

## 2013-06-29 DIAGNOSIS — F3289 Other specified depressive episodes: Secondary | ICD-10-CM | POA: Insufficient documentation

## 2013-06-29 DIAGNOSIS — F329 Major depressive disorder, single episode, unspecified: Secondary | ICD-10-CM | POA: Insufficient documentation

## 2013-06-29 DIAGNOSIS — Z87891 Personal history of nicotine dependence: Secondary | ICD-10-CM | POA: Insufficient documentation

## 2013-06-29 DIAGNOSIS — Z862 Personal history of diseases of the blood and blood-forming organs and certain disorders involving the immune mechanism: Secondary | ICD-10-CM | POA: Insufficient documentation

## 2013-06-29 DIAGNOSIS — R079 Chest pain, unspecified: Secondary | ICD-10-CM

## 2013-06-29 DIAGNOSIS — Z79899 Other long term (current) drug therapy: Secondary | ICD-10-CM | POA: Insufficient documentation

## 2013-06-29 DIAGNOSIS — R509 Fever, unspecified: Secondary | ICD-10-CM

## 2013-06-29 DIAGNOSIS — R109 Unspecified abdominal pain: Secondary | ICD-10-CM | POA: Insufficient documentation

## 2013-06-29 DIAGNOSIS — M545 Low back pain, unspecified: Secondary | ICD-10-CM | POA: Insufficient documentation

## 2013-06-29 DIAGNOSIS — Z8742 Personal history of other diseases of the female genital tract: Secondary | ICD-10-CM | POA: Insufficient documentation

## 2013-06-29 DIAGNOSIS — R059 Cough, unspecified: Secondary | ICD-10-CM

## 2013-06-29 DIAGNOSIS — R05 Cough: Secondary | ICD-10-CM | POA: Insufficient documentation

## 2013-06-29 LAB — URINALYSIS, ROUTINE W REFLEX MICROSCOPIC
Bilirubin Urine: NEGATIVE
GLUCOSE, UA: NEGATIVE mg/dL
KETONES UR: NEGATIVE mg/dL
Leukocytes, UA: NEGATIVE
Nitrite: NEGATIVE
Protein, ur: NEGATIVE mg/dL
Specific Gravity, Urine: 1.025 (ref 1.005–1.030)
Urobilinogen, UA: 0.2 mg/dL (ref 0.0–1.0)
pH: 6 (ref 5.0–8.0)

## 2013-06-29 LAB — URINE MICROSCOPIC-ADD ON

## 2013-06-29 MED ORDER — HYDROCOD POLST-CHLORPHEN POLST 10-8 MG/5ML PO LQCR
5.0000 mL | Freq: Once | ORAL | Status: AC
  Administered 2013-06-29: 5 mL via ORAL
  Filled 2013-06-29: qty 5

## 2013-06-29 MED ORDER — KETOROLAC TROMETHAMINE 60 MG/2ML IM SOLN
60.0000 mg | Freq: Once | INTRAMUSCULAR | Status: AC
  Administered 2013-06-29: 60 mg via INTRAMUSCULAR
  Filled 2013-06-29: qty 2

## 2013-06-29 MED ORDER — HYDROCOD POLST-CHLORPHEN POLST 10-8 MG/5ML PO LQCR: 5.0000 mL | Freq: Two times a day (BID) | ORAL | Status: DC | PRN

## 2013-06-29 MED ORDER — ALBUTEROL SULFATE HFA 108 (90 BASE) MCG/ACT IN AERS: 1.0000 | INHALATION_SPRAY | Freq: Four times a day (QID) | RESPIRATORY_TRACT | Status: DC | PRN

## 2013-06-29 MED ORDER — ACETAMINOPHEN 500 MG PO TABS
1000.0000 mg | ORAL_TABLET | Freq: Once | ORAL | Status: AC
  Administered 2013-06-29: 1000 mg via ORAL
  Filled 2013-06-29: qty 2

## 2013-06-29 NOTE — ED Notes (Signed)
C/o mid sharp CP starting today, cough NP for awhile, vomited x1 this morning but denies nausea at this time, mid back pain, denies burning on urination, denies vaginal discharge

## 2013-06-29 NOTE — ED Provider Notes (Signed)
CSN: [id number]     Arrival date & time 06/29/13  1839 History  This chart was scribed for Nat Christen, MD by Jenne Campus, ED Scribe. This patient was seen in room APA06/APA06 and the patient's care was started at 8:08 PM.   Chief Complaint  Patient presents with  . Chest Pain     The history is provided by the patient. No language interpreter was used.    HPI Comments: Rebecca Boyd is a 38 y.o. female who presents to the Emergency Department complaining of central CP that started today with an associated dry cough and bilateral flank pain that have been going on "for a while". The pain is described as an intermittent sharpness. She denies any nasal congestion, wheezing, nausea, dysuria, vaginal discharge, dyspnea, diaphoresis, nausea. She denies any chronic medical conditions.    Past Medical History  Diagnosis Date  . Depressive disorder, not elsewhere classified   . Neck pain   . Vaginitis     cyctitis  . Miscarriage 2009  . Anemia    Past Surgical History  Procedure Laterality Date  . Cervical cryotherapy N/A 1999   Family History  Problem Relation Age of Onset  . Drug abuse Mother   . Hypertension Father   . Hypertension Maternal Grandmother   . Hypertension Maternal Grandfather   . Hypertension Paternal Grandmother    History  Substance Use Topics  . Smoking status: Former Smoker    Types: Cigarettes  . Smokeless tobacco: Never Used  . Alcohol Use: No   No OB history provided.  Review of Systems  A complete 10 system review of systems was obtained and all systems are negative except as noted in the HPI and PMH.    Allergies  Diphenhydramine hcl and Orange fruit  Home Medications   Current Outpatient Rx  Name  Route  Sig  Dispense  Refill  . ALPRAZolam (XANAX) 1 MG tablet   Oral   Take 1 mg by mouth at bedtime.         Marland Kitchen FLUoxetine (PROZAC) 20 MG capsule   Oral   Take 1 capsule (20 mg total) by mouth daily.   30 capsule   3     Dose  increase effective 11/14/2012   . albuterol (PROVENTIL HFA;VENTOLIN HFA) 108 (90 BASE) MCG/ACT inhaler   Inhalation   Inhale 1-2 puffs into the lungs every 6 (six) hours as needed for wheezing or shortness of breath.   1 Inhaler   0   . chlorpheniramine-HYDROcodone (TUSSIONEX PENNKINETIC ER) 10-8 MG/5ML LQCR   Oral   Take 5 mLs by mouth every 12 (twelve) hours as needed for cough.   120 mL   0    Triage Vitals: BP 157/81  Pulse 108  Temp(Src) 100.8 F (38.2 C) (Oral)  Resp 18  Ht 5' (1.524 m)  Wt 148 lb (67.132 kg)  BMI 28.90 kg/m2  SpO2 100%  LMP 06/26/2013  Physical Exam  Nursing note and vitals reviewed. Constitutional: She is oriented to person, place, and time. She appears well-developed and well-nourished.  HENT:  Head: Normocephalic and atraumatic.  Eyes: Conjunctivae and EOM are normal. Pupils are equal, round, and reactive to light.  Neck: Normal range of motion. Neck supple.  Cardiovascular: Normal rate, regular rhythm and normal heart sounds.   Pulmonary/Chest: Effort normal and breath sounds normal. She exhibits no tenderness.  Abdominal: Soft. Bowel sounds are normal.  Musculoskeletal: Normal range of motion.  Mild tenderness to bilateral lower  back  Neurological: She is alert and oriented to person, place, and time.  Skin: Skin is warm and dry.  Psychiatric: She has a normal mood and affect. Her behavior is normal.    ED Course  Procedures (including critical care time)  DIAGNOSTIC STUDIES: Oxygen Saturation is 100% on RA, nml by my interpretation.    COORDINATION OF CARE: 8:11 PM-Discussed treatment plan which includes Toradol, EKG and CXR with pt at bedside and pt agreed to plan.   Labs Review Labs Reviewed  URINALYSIS, ROUTINE W REFLEX MICROSCOPIC - Abnormal; Notable for the following:    Hgb urine dipstick LARGE (*)    All other components within normal limits  URINE MICROSCOPIC-ADD ON   Imaging Review Dg Chest 2 View  06/29/2013    CLINICAL DATA:  Mid chest pain.  Cough.  Vomiting.  EXAM: CHEST  2 VIEW  COMPARISON:  04/28/2009.  FINDINGS: Minimal peribronchial thickening may be normal versus minimal bronchitic changes.  No infiltrate, congestive heart failure or pneumothorax.  Heart size within normal limits.  IMPRESSION: Minimal peribronchial thickening may be normal versus minimal bronchitic changes.  No infiltrate, congestive heart failure or pneumothorax.   Electronically Signed   By: Chauncey Cruel M.D.   On: 06/29/2013 21:40     EKG Interpretation   Date/Time:  Sunday June 29 2013 20:40:31 EST Ventricular Rate:  104 PR Interval:  142 QRS Duration: 68 QT Interval:  344 QTC Calculation: 452 R Axis:   63 Text Interpretation:  Sinus tachycardia Otherwise normal ECG When compared  with ECG of 24-Nov-2010 21:45, Vent. rate has increased BY  34 BPM  Nonspecific T wave abnormality no longer evident in Anterior leads  Confirmed by Gurnoor Sloop  MD, Kamariah Fruchter ([id number]) on 06/29/2013 9:56:23 PM      MDM   Final diagnoses:  Cough  Fever  Chest pain   Patient is low risk for ACS or pulmonary embolism. She has a fever which suggests a viral bronchitis.  EKG, chest x-ray negative. Urinalysis shows hemoglobin but no infection. Discharge medications albuterol inhaler and Tussionex cough syrup I personally performed the services described in this documentation, which was scribed in my presence. The recorded information has been reviewed and is accurate.      Nat Christen, MD 06/29/13 [phone]

## 2013-06-29 NOTE — Discharge Instructions (Signed)
EKG, chest x-ray and urinalysis were all normal. You have a fever. Increase fluids. Tylenol every 4 hours for fever. Prescription for cough syrup and inhaler. Followup your Dr.

## 2013-06-29 NOTE — ED Notes (Signed)
C/o bilateral leg weakness

## 2013-06-30 ENCOUNTER — Encounter (HOSPITAL_COMMUNITY): Payer: Self-pay | Admitting: Emergency Medicine

## 2013-06-30 ENCOUNTER — Encounter: Payer: 59 | Admitting: Obstetrics and Gynecology

## 2013-06-30 ENCOUNTER — Ambulatory Visit (INDEPENDENT_AMBULATORY_CARE_PROVIDER_SITE_OTHER): Payer: 59 | Admitting: Family Medicine

## 2013-06-30 ENCOUNTER — Encounter (INDEPENDENT_AMBULATORY_CARE_PROVIDER_SITE_OTHER): Payer: Self-pay

## 2013-06-30 ENCOUNTER — Encounter: Payer: Self-pay | Admitting: Family Medicine

## 2013-06-30 ENCOUNTER — Emergency Department (HOSPITAL_COMMUNITY): Payer: 59

## 2013-06-30 ENCOUNTER — Emergency Department (HOSPITAL_COMMUNITY)
Admission: EM | Admit: 2013-06-30 | Discharge: 2013-06-30 | Disposition: A | Discharge status: Home / Self Care | Payer: 59 | Attending: Emergency Medicine | Admitting: Emergency Medicine

## 2013-06-30 VITALS — BP 130/74 | HR 110 | Temp 103.0°F | Resp 18 | Ht 60.0 in

## 2013-06-30 DIAGNOSIS — F3289 Other specified depressive episodes: Secondary | ICD-10-CM | POA: Insufficient documentation

## 2013-06-30 DIAGNOSIS — R109 Unspecified abdominal pain: Secondary | ICD-10-CM | POA: Insufficient documentation

## 2013-06-30 DIAGNOSIS — Z79899 Other long term (current) drug therapy: Secondary | ICD-10-CM | POA: Insufficient documentation

## 2013-06-30 DIAGNOSIS — R509 Fever, unspecified: Secondary | ICD-10-CM

## 2013-06-30 DIAGNOSIS — Z3202 Encounter for pregnancy test, result negative: Secondary | ICD-10-CM | POA: Insufficient documentation

## 2013-06-30 DIAGNOSIS — A599 Trichomoniasis, unspecified: Secondary | ICD-10-CM | POA: Insufficient documentation

## 2013-06-30 DIAGNOSIS — N730 Acute parametritis and pelvic cellulitis: Secondary | ICD-10-CM | POA: Insufficient documentation

## 2013-06-30 DIAGNOSIS — Z87891 Personal history of nicotine dependence: Secondary | ICD-10-CM | POA: Insufficient documentation

## 2013-06-30 DIAGNOSIS — F329 Major depressive disorder, single episode, unspecified: Secondary | ICD-10-CM | POA: Insufficient documentation

## 2013-06-30 DIAGNOSIS — Z862 Personal history of diseases of the blood and blood-forming organs and certain disorders involving the immune mechanism: Secondary | ICD-10-CM | POA: Insufficient documentation

## 2013-06-30 LAB — URINE MICROSCOPIC-ADD ON

## 2013-06-30 LAB — CBC WITH DIFFERENTIAL/PLATELET
Basophils Absolute: 0 10*3/uL (ref 0.0–0.1)
Basophils Relative: 0 % (ref 0–1)
EOS ABS: 0 10*3/uL (ref 0.0–0.7)
EOS PCT: 1 % (ref 0–5)
HCT: 26 % — ABNORMAL LOW (ref 36.0–46.0)
Hemoglobin: 8 g/dL — ABNORMAL LOW (ref 12.0–15.0)
LYMPHS PCT: 8 % — AB (ref 12–46)
Lymphs Abs: 0.4 10*3/uL — ABNORMAL LOW (ref 0.7–4.0)
MCH: 19.7 pg — ABNORMAL LOW (ref 26.0–34.0)
MCHC: 30.8 g/dL (ref 30.0–36.0)
MCV: 64 fL — ABNORMAL LOW (ref 78.0–100.0)
MONO ABS: 0.4 10*3/uL (ref 0.1–1.0)
Monocytes Relative: 8 % (ref 3–12)
NEUTROS PCT: 83 % — AB (ref 43–77)
Neutro Abs: 3.6 10*3/uL (ref 1.7–7.7)
PLATELETS: 185 10*3/uL (ref 150–400)
RBC: 4.06 MIL/uL (ref 3.87–5.11)
RDW: 19.2 % — ABNORMAL HIGH (ref 11.5–15.5)
WBC Morphology: INCREASED
WBC: 4.4 10*3/uL (ref 4.0–10.5)

## 2013-06-30 LAB — URINALYSIS, ROUTINE W REFLEX MICROSCOPIC
Bilirubin Urine: NEGATIVE
Glucose, UA: NEGATIVE mg/dL
Ketones, ur: NEGATIVE mg/dL
LEUKOCYTES UA: NEGATIVE
NITRITE: NEGATIVE
PH: 5.5 (ref 5.0–8.0)
Protein, ur: NEGATIVE mg/dL
Specific Gravity, Urine: 1.02 (ref 1.005–1.030)
Urobilinogen, UA: 0.2 mg/dL (ref 0.0–1.0)

## 2013-06-30 LAB — COMPREHENSIVE METABOLIC PANEL
ALBUMIN: 3.7 g/dL (ref 3.5–5.2)
ALT: 17 U/L (ref 0–35)
AST: 25 U/L (ref 0–37)
Alkaline Phosphatase: 88 U/L (ref 39–117)
BILIRUBIN TOTAL: 0.2 mg/dL — AB (ref 0.3–1.2)
BUN: 8 mg/dL (ref 6–23)
CHLORIDE: 100 meq/L (ref 96–112)
CO2: 22 mEq/L (ref 19–32)
CREATININE: 0.9 mg/dL (ref 0.50–1.10)
Calcium: 8.8 mg/dL (ref 8.4–10.5)
GFR calc Af Amer: >90 mL/min (ref >90)
GFR calc non Af Amer: 81 mL/min — ABNORMAL LOW (ref >90)
Glucose, Bld: 99 mg/dL (ref 70–99)
POTASSIUM: 3.5 meq/L — AB (ref 3.7–5.3)
Sodium: 134 mEq/L — ABNORMAL LOW (ref 137–147)
Total Protein: 7.5 g/dL (ref 6.0–8.3)

## 2013-06-30 LAB — WET PREP, GENITAL
Clue Cells Wet Prep HPF POC: NONE SEEN
YEAST WET PREP: NONE SEEN

## 2013-06-30 LAB — LIPASE, BLOOD: LIPASE: 19 U/L (ref 11–59)

## 2013-06-30 LAB — PREGNANCY, URINE: Preg Test, Ur: NEGATIVE

## 2013-06-30 MED ORDER — AZITHROMYCIN 250 MG PO TABS
1000.0000 mg | ORAL_TABLET | Freq: Once | ORAL | Status: AC
  Administered 2013-06-30: 1000 mg via ORAL
  Filled 2013-06-30: qty 4

## 2013-06-30 MED ORDER — HYDROCODONE-ACETAMINOPHEN 5-325 MG PO TABS: 1.0000 | ORAL_TABLET | ORAL | Status: DC | PRN

## 2013-06-30 MED ORDER — CEFTRIAXONE SODIUM 250 MG IJ SOLR
250.0000 mg | Freq: Once | INTRAMUSCULAR | Status: AC
  Administered 2013-06-30: 250 mg via INTRAMUSCULAR
  Filled 2013-06-30: qty 250

## 2013-06-30 MED ORDER — METRONIDAZOLE 500 MG PO TABS: 500.0000 mg | ORAL_TABLET | Freq: Two times a day (BID) | ORAL | Status: DC

## 2013-06-30 MED ORDER — IBUPROFEN 600 MG PO TABS: 600.0000 mg | ORAL_TABLET | Freq: Four times a day (QID) | ORAL | Status: DC | PRN

## 2013-06-30 MED ORDER — STERILE WATER FOR INJECTION IJ SOLN
INTRAMUSCULAR | Status: AC
  Filled 2013-06-30: qty 10

## 2013-06-30 MED ORDER — SODIUM CHLORIDE 0.9 % IV BOLUS (SEPSIS)
1000.0000 mL | Freq: Once | INTRAVENOUS | Status: AC
  Administered 2013-06-30: 1000 mL via INTRAVENOUS

## 2013-06-30 MED ORDER — ONDANSETRON HCL 4 MG PO TABS: 4.0000 mg | ORAL_TABLET | Freq: Four times a day (QID) | ORAL | Status: DC

## 2013-06-30 MED ORDER — IOHEXOL 300 MG/ML  SOLN
50.0000 mL | Freq: Once | INTRAMUSCULAR | Status: AC | PRN
  Administered 2013-06-30: 50 mL via ORAL

## 2013-06-30 MED ORDER — METRONIDAZOLE 500 MG PO TABS
2000.0000 mg | ORAL_TABLET | Freq: Once | ORAL | Status: AC
  Administered 2013-06-30: 2000 mg via ORAL
  Filled 2013-06-30: qty 4

## 2013-06-30 MED ORDER — MORPHINE SULFATE 4 MG/ML IJ SOLN
4.0000 mg | Freq: Once | INTRAMUSCULAR | Status: AC
  Administered 2013-06-30: 4 mg via INTRAVENOUS
  Filled 2013-06-30: qty 1

## 2013-06-30 MED ORDER — DOXYCYCLINE HYCLATE 100 MG PO CAPS: 100.0000 mg | ORAL_CAPSULE | Freq: Two times a day (BID) | ORAL | Status: DC

## 2013-06-30 MED ORDER — ONDANSETRON HCL 4 MG/2ML IJ SOLN
4.0000 mg | Freq: Once | INTRAMUSCULAR | Status: AC
  Administered 2013-06-30: 4 mg via INTRAVENOUS
  Filled 2013-06-30: qty 2

## 2013-06-30 MED ORDER — IOHEXOL 300 MG/ML  SOLN
100.0000 mL | Freq: Once | INTRAMUSCULAR | Status: AC | PRN
  Administered 2013-06-30: 100 mL via INTRAVENOUS

## 2013-06-30 NOTE — Patient Instructions (Signed)
You need to go directly to the ED.  You have high fever,103 degrees, constant 10 plus back and lower extremity pain and also lower  Abdominal pain with rebound tenderness

## 2013-06-30 NOTE — Discharge Instructions (Signed)
Your labs today were unremarkable.  Your CT scan showed a normal appendix.  Your pelvic exam showed you do have a sexually transmitted disease called trichomonas.  We have treated you for Trichomonas, gonorrhea and chlamydia.  Your gonorrhea and Chlamydia tests will take several days before that result.  A few are positive, you will be contacted.  Her partner will also need to be treated and tested.  Please avoid any sexual intercourse until her partner has been treated.  Given you were  very tender on  your  pelvic exam, we are sending you with antibiotics to treat you for possible pelvic inflammatory disease.  Your ultrasound did not show any abnormalities.  Please take these medications twice a day for 2 weeks.  Please followup with your doctor next week.  If you have any worsening symptoms such as worsening pain, continued fevers, vomiting and cannot keep down medication, please return to the emergency department.   Abdominal Pain, Women Abdominal (stomach, pelvic, or belly) pain can be caused by many things. It is important to tell your doctor:  The location of the pain.  Does it come and go or is it present all the time?  Are there things that start the pain (eating certain foods, exercise)?  Are there other symptoms associated with the pain (fever, nausea, vomiting, diarrhea)? All of this is helpful to know when trying to find the cause of the pain. CAUSES   Stomach: virus or bacteria infection, or ulcer.  Intestine: appendicitis (inflamed appendix), regional ileitis (Crohn's disease), ulcerative colitis (inflamed colon), irritable bowel syndrome, diverticulitis (inflamed diverticulum of the colon), or cancer of the stomach or intestine.  Gallbladder disease or stones in the gallbladder.  Kidney disease, kidney stones, or infection.  Pancreas infection or cancer.  Fibromyalgia (pain disorder).  Diseases of the female organs:  Uterus: fibroid (non-cancerous) tumors or  infection.  Fallopian tubes: infection or tubal pregnancy.  Ovary: cysts or tumors.  Pelvic adhesions (scar tissue).  Endometriosis (uterus lining tissue growing in the pelvis and on the pelvic organs).  Pelvic congestion syndrome (female organs filling up with blood just before the menstrual period).  Pain with the menstrual period.  Pain with ovulation (producing an egg).  Pain with an IUD (intrauterine device, birth control) in the uterus.  Cancer of the female organs.  Functional pain (pain not caused by a disease, may improve without treatment).  Psychological pain.  Depression. DIAGNOSIS  Your doctor will decide the seriousness of your pain by doing an examination.  Blood tests.  X-rays.  Ultrasound.  CT scan (computed tomography, special type of X-ray).  MRI (magnetic resonance imaging).  Cultures, for infection.  Barium enema (dye inserted in the large intestine, to better view it with X-rays).  Colonoscopy (looking in intestine with a lighted tube).  Laparoscopy (minor surgery, looking in abdomen with a lighted tube).  Major abdominal exploratory surgery (looking in abdomen with a large incision). TREATMENT  The treatment will depend on the cause of the pain.   Many cases can be observed and treated at home.  Over-the-counter medicines recommended by your caregiver.  Prescription medicine.  Antibiotics, for infection.  Birth control pills, for painful periods or for ovulation pain.  Hormone treatment, for endometriosis.  Nerve blocking injections.  Physical therapy.  Antidepressants.  Counseling with a psychologist or psychiatrist.  Minor or major surgery. HOME CARE INSTRUCTIONS   Do not take laxatives, unless directed by your caregiver.  Take over-the-counter pain medicine only if ordered  by your caregiver. Do not take aspirin because it can cause an upset stomach or bleeding.  Try a clear liquid diet (broth or water) as ordered  by your caregiver. Slowly move to a bland diet, as tolerated, if the pain is related to the stomach or intestine.  Have a thermometer and take your temperature several times a day, and record it.  Bed rest and sleep, if it helps the pain.  Avoid sexual intercourse, if it causes pain.  Avoid stressful situations.  Keep your follow-up appointments and tests, as your caregiver orders.  If the pain does not go away with medicine or surgery, you may try:  Acupuncture.  Relaxation exercises (yoga, meditation).  Group therapy.  Counseling. SEEK MEDICAL CARE IF:   You notice certain foods cause stomach pain.  Your home care treatment is not helping your pain.  You need stronger pain medicine.  You want your IUD removed.  You feel faint or lightheaded.  You develop nausea and vomiting.  You develop a rash.  You are having side effects or an allergy to your medicine. SEEK IMMEDIATE MEDICAL CARE IF:   Your pain does not go away or gets worse.  You have a fever.  Your pain is felt only in portions of the abdomen. The right side could possibly be appendicitis. The left lower portion of the abdomen could be colitis or diverticulitis.  You are passing blood in your stools (bright red or black tarry stools, with or without vomiting).  You have blood in your urine.  You develop chills, with or without a fever.  You pass out. MAKE SURE YOU:   Understand these instructions.  Will watch your condition.  Will get help right away if you are not doing well or get worse. Document Released: 02/12/2007 Document Revised: 07/10/2011 Document Reviewed: 03/04/2009 [facility] Patient Information 2014 [place], [place].  Pelvic Inflammatory Disease Pelvic inflammatory disease (PID) refers to an infection in some or all of the female organs. The infection can be in the uterus, ovaries, fallopian tubes, or the surrounding tissues in the pelvis. PID can cause abdominal or pelvic pain that  comes on suddenly (acute pelvic pain). PID is a serious infection because it can lead to lasting (chronic) pelvic pain or the inability to have children (infertile).  CAUSES  The infection is often caused by the normal bacteria found in the vaginal tissues. PID may also be caused by an infection that is spread during sexual contact. PID can also occur following:   The birth of a baby.   A miscarriage.   An abortion.   Major pelvic surgery.   The use of an intrauterine device (IUD).   A sexual assault.  RISK FACTORS Certain factors can put a person at higher risk for PID, such as:  Being younger than 25 years.  Being sexually active at [place] age.  Usingnonbarrier contraception.  Havingmultiple sexual partners.  Having sex with someone who has symptoms of a genital infection.  Using oral contraception. Other times, certain behaviors can increase the possibility of getting PID, such as:  Having sex during your period.  Using a vaginal douche.  Having an intrauterine device (IUD) in place. SYMPTOMS   Abdominal or pelvic pain.   Fever.   Chills.   Abnormal vaginal discharge.  Abnormal uterine bleeding.   Unusual pain shortly after finishing your period. DIAGNOSIS  Your caregiver will choose some of the following methods to make a diagnosis, such as:   Performinga physical exam  and history. A pelvic exam typically reveals a very tender uterus and surrounding pelvis.   Ordering laboratory tests including a pregnancy test, blood tests, and urine test.  Orderingcultures of the vagina and cervix to check for a sexually transmitted infection (STI).  Performing an ultrasound.   Performing a laparoscopic procedure to look inside the pelvis.  TREATMENT   Antibiotic medicines may be prescribed and taken by mouth.   Sexual partners may be treated when the infection is caused by a sexually transmitted disease (STD).   Hospitalization may be  needed to give antibiotics intravenously.  Surgery may be needed, but this is rare. It may take weeks until you are completely well. If you are diagnosed with PID, you should also be checked for human immunodeficiency virus (HIV). HOME CARE INSTRUCTIONS   If given, take your antibiotics as directed. Finish the medicine even if you start to feel better.   Only take over-the-counter or prescription medicines for pain, discomfort, or fever as directed by your caregiver.   Do not have sexual intercourse until treatment is completed or as directed by your caregiver. If PID is confirmed, your recent sexual partner(s) will need treatment.   Keep your follow-up appointments. SEEK MEDICAL CARE IF:   You have increased or abnormal vaginal discharge.   You need prescription medicine for your pain.   You vomit.   You cannot take your medicines.   Your partner has an STD.  SEEK IMMEDIATE MEDICAL CARE IF:   You have a fever.   You have increased abdominal or pelvic pain.   You have chills.   You have pain when you urinate.   You are not better after 72 hours following treatment.  MAKE SURE YOU:   Understand these instructions.  Will watch your condition.  Will get help right away if you are not doing well or get worse. Document Released: 04/17/2005 Document Revised: 08/12/2012 Document Reviewed: 04/13/2011 [facility] Patient Information 2014 [place], [place].  Trichomoniasis Trichomoniasis is an infection, caused by the Trichomonas organism, that affects both women and men. In women, the outer female genitalia and the vagina are affected. In men, the penis is mainly affected, but the prostate and other reproductive organs can also be involved. Trichomoniasis is a sexually transmitted disease (STD) and is most often passed to another person through sexual contact. The majority of people who get trichomoniasis do so from a sexual encounter and are also at risk for other  STDs. CAUSES   Sexual intercourse with an infected partner.  It can be present in swimming pools or hot tubs. SYMPTOMS   Abnormal gray-green frothy vaginal discharge in women.  Vaginal itching and irritation in women.  Itching and irritation of the area outside the vagina in women.  Penile discharge with or without pain in males.  Inflammation of the urethra (urethritis), causing painful urination.  Bleeding after sexual intercourse. RELATED COMPLICATIONS  Pelvic inflammatory disease.  Infection of the uterus (endometritis).  Infertility.  Tubal (ectopic) pregnancy.  It can be associated with other STDs, including gonorrhea and chlamydia, hepatitis B, and HIV. COMPLICATIONS DURING PREGNANCY  Early (premature) delivery.  Premature rupture of the membranes (PROM).  Low birth weight. DIAGNOSIS   Visualization of Trichomonas under the microscope from the vagina discharge.  Ph of the vagina greater than 4.5, tested with a test tape.  Trich Rapid Test.  Culture of the organism, but this is not usually needed.  It may be found on a Pap test.  Having a "  strawberry cervix,"which means the cervix looks very red like a strawberry. TREATMENT   You may be given medication to fight the infection. Inform your caregiver if you could be or are pregnant. Some medications used to treat the infection should not be taken during pregnancy.  Over-the-counter medications or creams to decrease itching or irritation may be recommended.  Your sexual partner will need to be treated if infected. HOME CARE INSTRUCTIONS   Take all medication prescribed by your caregiver.  Take over-the-counter medication for itching or irritation as directed by your caregiver.  Do not have sexual intercourse while you have the infection.  Do not douche or wear tampons.  Discuss your infection with your partner, as your partner may have acquired the infection from you. Or, your partner may have  been the person who transmitted the infection to you.  Have your sex partner examined and treated if necessary.  Practice safe, informed, and protected sex.  See your caregiver for other STD testing. SEEK MEDICAL CARE IF:   You still have symptoms after you finish the medication.  You have an oral temperature above 102 F (38.9 C).  You develop belly (abdominal) pain.  You have pain when you urinate.  You have bleeding after sexual intercourse.  You develop a rash.  The medication makes you sick or makes you throw up (vomit). Document Released: 10/11/2000 Document Revised: 07/10/2011 Document Reviewed: 11/06/2008 [facility] Patient Information 2014 [place], [place].

## 2013-06-30 NOTE — ED Notes (Signed)
Sent to ER  By Dr Moshe Cipro,  With fever, abd pain , leg pain. No cough  No NVD.  ,Intermittent chest discomfort.   Given tylenol for fever at office. Temp had been 103 .

## 2013-06-30 NOTE — Progress Notes (Signed)
   Subjective:    Patient ID: Rebecca Boyd, female    DOB: 10/16/1975, 38 y.o.   MRN: [id number]  HPI Acute onset of severe mid and low back pain, abdominal pain, chills.Unable to work due to severity of her symptoms. Denies dysuria or frequency, recently dx with and treated for trichomonas.    Review of Systems See HPI . Denies sinus pressure, nasal congestion, ear pain or sore throat. Denies chest congestion, productive cough or wheezing. Denies chest pains, palpitations and leg swelling  Denies headaches, seizures, numbness, or tingling. Chronic depression, anxiety and  Insomnia.Not suicidal or homicidal Denies skin break down or rash.        Objective:   Physical Exam  BP 130/74  Pulse 110  Temp(Src) 103 F (39.4 C)  Resp 18  Ht 5' (1.524 m)  SpO2 98%  LMP 06/28/2013 Patient alert and oriented and ill appearing, in pain with high fever HEENT: No facial asymmetry, EOMI, no sinus tenderness,  oropharynx pink and moist.  Neck supple no adenopathy.  Chest: Clear to auscultation bilaterally.  CVS: S1, S2 no murmurs, no S3.  ABD: tender with rebound in lower abdomen, normal bS, no organomegaly or mass Pelvic : deferred Ext: No edema  MS: Adequate though reduced  ROM spine, normal in  shoulders, hips and knees.  Skin: Intact, no ulcerations or rash noted.       Assessment & Plan:

## 2013-06-30 NOTE — ED Provider Notes (Signed)
TIME SEEN: 5:21 PM  CHIEF COMPLAINT: Fever, abdominal pain  HPI: Patient is a 38 year old female with a history of depression who presents emergency Department with sharp, moderate lower abdominal pain that radiates into her back and lower extremities that started today.  She also developed fever yesterday and was seen in the emergency department overnight for chest pain and cough.  Her chest x-ray at that time showed no infiltrate.  She states she was seen by her PCP today when she started having abdominal pain and they sent her here for further evaluation and to rule out appendicitis.  She denies a prior history of abdominal surgeries.  Denies any sick contacts.  No aggravating or alleviating factors.  Denies any nausea, vomiting, diarrhea, dysuria or hematuria, abnormal vaginal bleeding or discharge.  She is currently on her menstrual cycle.  ROS: See HPI Constitutional: no fever  Eyes: no drainage  ENT: no runny nose   Cardiovascular:  no chest pain  Resp: no SOB  GI: no vomiting GU: no dysuria Integumentary: no rash  Allergy: no hives  Musculoskeletal: no leg swelling  Neurological: no slurred speech ROS otherwise negative  PAST MEDICAL HISTORY/PAST SURGICAL HISTORY:  Past Medical History  Diagnosis Date  . Depressive disorder, not elsewhere classified   . Neck pain   . Vaginitis     cyctitis  . Miscarriage 2009  . Anemia     MEDICATIONS:  Prior to Admission medications   Medication Sig Start Date End Date Taking? Authorizing Provider  albuterol (PROVENTIL HFA;VENTOLIN HFA) 108 (90 BASE) MCG/ACT inhaler Inhale 1-2 puffs into the lungs every 6 (six) hours as needed for wheezing or shortness of breath. 06/29/13   Nat Christen, MD  ALPRAZolam Duanne Moron) 1 MG tablet Take 1 mg by mouth at bedtime.    Historical Provider, MD  chlorpheniramine-HYDROcodone (TUSSIONEX PENNKINETIC ER) 10-8 MG/5ML LQCR Take 5 mLs by mouth every 12 (twelve) hours as needed for cough. 06/29/13   Nat Christen, MD   FLUoxetine (PROZAC) 20 MG capsule Take 1 capsule (20 mg total) by mouth daily. 05/29/13 05/29/14  Fayrene Helper, MD    ALLERGIES:  Allergies  Allergen Reactions  . Diphenhydramine Hcl Anaphylaxis  . Orange Fruit Hives, Shortness Of Breath, Itching and Other (See Comments)    MAKES IT DIFFICULT TO BREATH    SOCIAL HISTORY:  History  Substance Use Topics  . Smoking status: Former Smoker    Types: Cigarettes  . Smokeless tobacco: Never Used  . Alcohol Use: No    FAMILY HISTORY: Family History  Problem Relation Age of Onset  . Drug abuse Mother   . Hypertension Father   . Hypertension Maternal Grandmother   . Hypertension Maternal Grandfather   . Hypertension Paternal Grandmother     EXAM: BP 116/72  Pulse 103  Temp(Src) 100.4 F (38 C) (Oral)  Resp 18  Ht 5' (1.524 m)  Wt 148 lb (67.132 kg)  BMI 28.90 kg/m2  SpO2 99%  LMP 06/28/2013 CONSTITUTIONAL: Alert and oriented and responds appropriately to questions. Well-appearing; well-nourished, febrile, nontoxic HEAD: Normocephalic EYES: Conjunctivae clear, PERRL ENT: normal nose; no rhinorrhea; moist mucous membranes; pharynx without lesions noted NECK: Supple, no meningismus, no LAD  CARD: Tachycardic; S1 and S2 appreciated; no murmurs, no clicks, no rubs, no gallops RESP: Normal chest excursion without splinting or tachypnea; breath sounds clear and equal bilaterally; no wheezes, no rhonchi, no rales, ABD/GI: Normal bowel sounds; non-distended; soft, non-tender, no rebound, no guarding,  diffusely tender to palpation  in the lower abdomen, worse at McBurney's point without voluntary guarding or peritoneal signs PELVIC:  Normal external genitalia, mild bilateral adnexal tenderness without fullness, mild cervical motion tenderness, no vaginal discharge, moderate amount of dark red blood coming from the cervical os BACK:  The back appears normal and is non-tender to palpation, there is no CVA tenderness EXT: Normal ROM  in all joints; non-tender to palpation; no edema; normal capillary refill; no cyanosis    SKIN: Normal color for age and race; warm NEURO: Moves all extremities equally PSYCH: The patient's mood and manner are appropriate. Grooming and personal hygiene are appropriate.  MEDICAL DECISION MAKING: Patient here with fever and lower abdominal pain.  Concern for appendicitis versus colitis versus UTI.  Will obtain abdominal labs, urine, CT abdomen pelvis.  ED PROGRESS: Patient's labs are unremarkable.  No leukocytosis.  Urine shows blood but pt is menstruating.  Her CT scan shows no bowel obstruction or acute inflammation, normal appendix.  There are bilateral cystic appearing masses in the adnexal regions, radiology recommends pelvic ultrasound for further evaluation.  Given patient's fever and pain, will obtain pelvic cultures and transvaginal ultrasound with Doppler.   9:04 PM  Pt's pelvic exam is unremarkable other than mild bilateral adnexal tenderness but no cervical motion tenderness.  There is no vaginal discharge but she is having a moderate amount of dark red blood coming from her cervical os.  Patient reports she is sexually active with one partner and they do not use protection but she is on birth control.  She denies any prior history of STDs.  She is not concerned that she may have been exposed to an STD.  She reports she was seen by her primary care physician last week and had a full physical including pelvic cultures which were all negative.   9:52 PM  Pt's wet prep shows trich and WBCs.  Will treat with CTX, Azithromycin, Flagyl.  TVUS pending.  10:33 PM  Pt's ultrasound showed no abnormalities.  Normal Doppler flow to both ovaries.  Will discharge with prescription for doxycycline and Flagyl for possible PID.  Have given strict return to questions.  Will have her followup with her PMD.  Patient verbalizes understanding and is comfortable with plan.  Also discussed with patient that she  will need to have her partner treated for sexually transmitted disease before sexual intercourse.  [place], DO 06/30/13 [id number]

## 2013-07-02 ENCOUNTER — Telehealth: Payer: Self-pay

## 2013-07-02 LAB — GC/CHLAMYDIA PROBE AMP
CT Probe RNA: NEGATIVE
GC Probe RNA: NEGATIVE

## 2013-07-02 NOTE — Telephone Encounter (Signed)
Yes aasuming she feels much better and able to work

## 2013-07-02 NOTE — Telephone Encounter (Signed)
Letter printed and patient aware.  

## 2013-07-11 ENCOUNTER — Encounter: Payer: 59 | Admitting: Obstetrics and Gynecology

## 2013-07-24 ENCOUNTER — Encounter: Payer: 59 | Admitting: Obstetrics and Gynecology

## 2013-07-30 ENCOUNTER — Ambulatory Visit (INDEPENDENT_AMBULATORY_CARE_PROVIDER_SITE_OTHER): Payer: 59 | Admitting: Family Medicine

## 2013-07-30 ENCOUNTER — Telehealth (HOSPITAL_COMMUNITY): Payer: Self-pay | Admitting: *Deleted

## 2013-07-30 ENCOUNTER — Encounter: Payer: Self-pay | Admitting: Family Medicine

## 2013-07-30 ENCOUNTER — Encounter (INDEPENDENT_AMBULATORY_CARE_PROVIDER_SITE_OTHER): Payer: Self-pay

## 2013-07-30 ENCOUNTER — Ambulatory Visit (HOSPITAL_COMMUNITY)
Admission: RE | Admit: 2013-07-30 | Discharge: 2013-07-30 | Disposition: A | Discharge status: Home / Self Care | Payer: 59 | Source: Ambulatory Visit | Attending: Family Medicine | Admitting: Family Medicine

## 2013-07-30 VITALS — BP 130/84 | HR 86 | Resp 16 | Ht 60.0 in | Wt 149.0 lb

## 2013-07-30 DIAGNOSIS — F32A Depression, unspecified: Secondary | ICD-10-CM

## 2013-07-30 DIAGNOSIS — M546 Pain in thoracic spine: Secondary | ICD-10-CM | POA: Insufficient documentation

## 2013-07-30 DIAGNOSIS — J45909 Unspecified asthma, uncomplicated: Secondary | ICD-10-CM

## 2013-07-30 DIAGNOSIS — M5440 Lumbago with sciatica, unspecified side: Secondary | ICD-10-CM

## 2013-07-30 DIAGNOSIS — M545 Low back pain, unspecified: Secondary | ICD-10-CM

## 2013-07-30 DIAGNOSIS — F329 Major depressive disorder, single episode, unspecified: Secondary | ICD-10-CM

## 2013-07-30 DIAGNOSIS — J4520 Mild intermittent asthma, uncomplicated: Secondary | ICD-10-CM

## 2013-07-30 DIAGNOSIS — F3289 Other specified depressive episodes: Secondary | ICD-10-CM

## 2013-07-30 MED ORDER — GABAPENTIN 300 MG PO CAPS: 300.0000 mg | ORAL_CAPSULE | Freq: Every day | ORAL | Status: DC

## 2013-07-30 MED ORDER — IBUPROFEN 800 MG PO TABS: 800.0000 mg | ORAL_TABLET | Freq: Three times a day (TID) | ORAL | Status: DC | PRN

## 2013-07-30 MED ORDER — CYCLOBENZAPRINE HCL 10 MG PO TABS: 10.0000 mg | ORAL_TABLET | Freq: Every day | ORAL | Status: DC

## 2013-07-30 MED ORDER — KETOROLAC TROMETHAMINE 60 MG/2ML IM SOLN
60.0000 mg | Freq: Once | INTRAMUSCULAR | Status: AC
Start: 2013-07-30 — End: 2013-07-30
  Administered 2013-07-30: 60 mg via INTRAMUSCULAR

## 2013-07-30 NOTE — Progress Notes (Signed)
   Subjective:    Patient ID: Rebecca Boyd, female    DOB: 1975-05-24, 38 y.o.   MRN: [id number]  HPI 3 month h/o increased and uncontrolled  Back pain from low neck to buttock, non radiating with no weakness or numbness in the lower extremities. States itr is negatively impacting her ability to work C/o uncontrolled and worsening depression, not suicidal or homicidal Treated recently in the Ed for PID , currently asymptomatic, states both herself and her partner have been treated Denies any recent episode of wheezing   Review of Systems See HPI Denies recent fever or chills. Denies sinus pressure, nasal congestion, ear pain or sore throat. Denies chest congestion, productive cough or wheezing. Denies chest pains, palpitations and leg swelling Denies abdominal pain, nausea, vomiting,diarrhea or constipation.   Denies dysuria, frequency, hesitancy or incontinence.  Denies headaches, seizures, numbness, or tingling.  Denies skin break down or rash.        Objective:   Physical Exam BP 130/84  Pulse 86  Resp 16  Ht 5' (1.524 m)  Wt 149 lb (67.586 kg)  BMI 29.10 kg/m2  SpO2 100%  LMP 06/28/2013 Patient alert and oriented and in no cardiopulmonary distress.  HEENT: No facial asymmetry, EOMI, no sinus tenderness,  oropharynx pink and moist.  Neck supple no adenopathy.  Chest: Clear to auscultation bilaterally.  CVS: S1, S2 no murmurs, no S3.  ABD: Soft non tender. Bowel sounds normal.  Ext: No edema  MS: decreased  ROM thoracolumbar  Spine, adequate in  shoulders, hips and knees.  Skin: Intact, no ulcerations or rash noted.  Psych: Good eye contact, tearful  affect. Memory intact anxious and  depressed appearing.  CNS: CN 2-12 intact, power, tone and sensation normal throughout.        Assessment & Plan:  Lumbar back pain Progressive and uncontrolled, need PT and xrays . Tporadol in office today and pain management as out pt through this  office  Thoracic back pain Uncontrolled , PT and oxrays today followed by out pt chronic pain management  Depression Unchanged, as far as absolute score is concerned, though patient reports worsening symptomsneeds to see psychiatry. Not suicidal or homicidal Dose increase in prozac and referral to therapist  Asthma, mild intermittent No recent flare uses albuterol infrequently as needed only

## 2013-07-30 NOTE — Patient Instructions (Signed)
F/u in 2 month, call if you ned me before  Toradol 60mg  IM today for back pain  Please get Xray of back today  You will be referred to physical therapy twice weekly for 4 weeks  You are referred for psychotherapy re depression and anxiety with female provider per your request, at [facility]  For back pain Ibuprofen 800mg  one daily, flexeril 10 mg one at bedtime and gabapentin 300mg  one at bed time  You may take tylenol 325 mg one daily also which is OTC

## 2013-08-02 NOTE — Assessment & Plan Note (Signed)
Progressive and uncontrolled, need PT and xrays . Tporadol in office today and pain management as out pt through this office

## 2013-08-02 NOTE — Assessment & Plan Note (Signed)
No recent flare uses albuterol infrequently as needed only

## 2013-08-02 NOTE — Assessment & Plan Note (Addendum)
Unchanged, as far as absolute score is concerned, though patient reports worsening symptomsneeds to see psychiatry. Not suicidal or homicidal Dose increase in prozac and referral to therapist

## 2013-08-02 NOTE — Assessment & Plan Note (Signed)
Uncontrolled , PT and oxrays today followed by out pt chronic pain management

## 2013-08-05 ENCOUNTER — Ambulatory Visit (HOSPITAL_COMMUNITY): Payer: 59 | Admitting: Physical Therapy

## 2013-08-06 ENCOUNTER — Encounter: Payer: 59 | Admitting: Obstetrics and Gynecology

## 2013-08-07 ENCOUNTER — Ambulatory Visit (HOSPITAL_COMMUNITY)
Admission: RE | Admit: 2013-08-07 | Discharge: 2013-08-07 | Disposition: A | Discharge status: Home / Self Care | Payer: 59 | Source: Ambulatory Visit | Attending: Family Medicine | Admitting: Family Medicine

## 2013-08-07 DIAGNOSIS — M542 Cervicalgia: Secondary | ICD-10-CM | POA: Insufficient documentation

## 2013-08-07 DIAGNOSIS — M6281 Muscle weakness (generalized): Secondary | ICD-10-CM | POA: Insufficient documentation

## 2013-08-07 DIAGNOSIS — M545 Low back pain, unspecified: Secondary | ICD-10-CM | POA: Insufficient documentation

## 2013-08-07 DIAGNOSIS — IMO0001 Reserved for inherently not codable concepts without codable children: Secondary | ICD-10-CM | POA: Insufficient documentation

## 2013-08-07 DIAGNOSIS — M546 Pain in thoracic spine: Secondary | ICD-10-CM | POA: Insufficient documentation

## 2013-08-07 DIAGNOSIS — M549 Dorsalgia, unspecified: Secondary | ICD-10-CM

## 2013-08-07 NOTE — Evaluation (Signed)
Physical Therapy Evaluation  Patient Details  Name: Rebecca Boyd MRN: [id number] Date of Birth: February 15, 1976  Today's Date: 08/07/2013 Time: 0800-0842 PT Time Calculation (min): 42 min   0800 - 0830 eval  0830 - 0842 manual            Visit#: 1 of 12  Re-eval: 09/06/13 Assessment Diagnosis: back pain  Next MD Visit: Dr. Moshe Cipro MD  appt in june   Authorization: united     Authorization Time Period:    Authorization Visit#:   of     Past Medical History:  Past Medical History  Diagnosis Date  . Depressive disorder, not elsewhere classified   . Neck pain   . Vaginitis     cyctitis  . Miscarriage 2009  . Anemia    Past Surgical History:  Past Surgical History  Procedure Laterality Date  . Cervical cryotherapy N/A 1999    Subjective Symptoms/Limitations Symptoms: " ive had upper and mid  back pain/stiffness on and off for past 3 months, unknown cause. 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  Pertinent History: injured back at work a couple of years ago with therapy, recalls having a ruptured disc  How long can you stand comfortably?: pain onset after 5 min standing  Patient Stated Goals: decrease pain  Pain Assessment Currently in Pain?: Yes Pain Score: 5 /10 average   Pain levels  vary  Pain Location: Back/ upper and mid  Pain Orientation: Right;Left Pain Type: Acute pain, sharp and dull  Pain Frequency: Intermittent Pain Relieving Factors: sitting, sitting with good posture    Balance Screening Balance Screen Has the patient fallen in the past 6 months: No  Prior Functioning  Prior Function Level of Independence: Independent with basic ADLs;Independent with gait  Able to Take Stairs?: Reciprically Driving: Yes Vocation: Full time employment Vocation Requirements: standing  Comments: ni work out routine , works 4 pm to 2 am   Cognition/Observation Cognition Overall Cognitive Status:  Within Functional Limits for tasks assessed Observation/Other Assessments Observations: sitting posture listed to left, redcued pain with self correction of upright posture                 Sensation/Coordination/Flexibility/Functional Tests Functional Tests Functional Tests: FOTO  54 score   Assessment Palpation Palpation: decrased PA mobility cervical and thoracic spine , spasm B upper and middle trapezius  thoarcic AROM : min limits with extension and rotation R  Lumbar AROM : WNL all planes   standing posture: forward head, lumbar lordosis WNL   UE AROM WNL / cervical AROM WNL  MMT : lower trpaezius and rhomboid 4/5  Lumbar extension 3+/5  Abdominal 3+/5  Exercise/Treatments Seated Other Seated Lumbar Exercises: seated thoracic extension AROM 5x, manual assisted thoacic extension 5X  Other Seated Lumbar Exercises: instructed in proper sitting posture with lumbar towel rolll     Quadruped Other Quadruped Lumbar Exercises: cat/camel 6 x thoracic stretch   Manual Therapy Manual Therapy: Joint mobilization Joint Mobilization: prone cervical and thoracic PA mobs grade 1-2 for pain , MFR thoraicic and upper cervical x 10 min   Physical Therapy Assessment and Plan PT Assessment and Plan Clinical Impression Statement: 38 year old pleasant femlae referred for back pain with chief complaints of upper thoracic and lower cervical pain, her symptoms worse with standing at work as little as 15 min and also worse in the morning. Fascila restriuctions in trapezius muscle groups Bilateral  There is not specific mechanism of injury. She can benefit from therapy to reach goals listed. Her functionla cervical /thoracic / and lumbar AROM does not cause radicular symptoms    Pt will benefit from skilled therapeutic intervention in order to improve on the following deficits: Impaired flexibility;Improper body mechanics;Pain;Increased fascial restricitons Rehab Potential: Good PT Frequency: Min  2X/week PT Duration: 6 weeks PT Plan: prone and standing postural exercises , manual cervical and thoracic , cervical stretches, thoaricic stretches, body mechanics education     Goals Home Exercise Program Pt/caregiver will Perform Home Exercise Program: For increased strengthening PT Goal: Perform Home Exercise Program - Progress: Goal set today PT Short Term Goals Time to Complete Short Term Goals: 2 weeks PT Short Term Goal 1: able to stand at work 20 min without increased pain  PT Short Term Goal 2: report reduced pain and stiffness in the morning by 25%  PT Long Term Goals Time to Complete Long Term Goals: 4 weeks PT Long Term Goal 1: able to stand at work 1 hour with max 2/10 pain  PT Long Term Goal 2: demonstrate proper lifting and carry mechanics for reduce strain on back at work and injury prevention  Long Term Goal 3: pain average level less than 5/10 for improved quality of life  Long Term Goal 4: drive 30 min with max 1-2/10   PT Long Term Goal 5: patient to place 5 pound items from waist height to above shoulder level 10x without increased pain  Additional PT Long Term Goals?: Yes PT Long Term Goal 6: standing activity at work 1 hour with max 3/10 pain   Problem List Patient Active Problem List   Diagnosis Date Noted  . Pain in thoracic spine 08/07/2013  . Back pain 08/07/2013  . Thoracic back pain 07/30/2013  . Lumbar back pain 07/30/2013  . Menorrhagia 06/12/2013  . Sebaceous cyst of labia 06/12/2013  . Female infertility, primary 06/12/2013  . Bartholin's cyst 06/01/2013  . 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 Headache(784.0) 11/14/2012  . Asthma, mild intermittent 11/14/2012  . Routine general medical examination at a health care facility 09/28/2012  . Urogenital infection by trichomonas vaginalis 09/20/2012  . Depression 09/18/2012  . Vitamin D deficiency 10/27/2010  . Anxiety 10/26/2010  . IRON DEFICIENCY ANEMIA SECONDARY TO BLOOD LOSS 03/07/2010  .  MENORRHAGIA 03/03/2010  . FATIGUE 08/11/2009  . VAGINITIS&VULVOVAGINITIS DISEASES CLASS ELSW 02/12/2008    PT Plan of Care PT Patient Instructions: siting posture with lumbar towel roll, cat /camel thoracic mobility   GP Functional Assessment Tool Used: Mauricia Area 08/07/2013, 9:18 AM  Physician Documentation Your signature is required to indicate approval of the treatment plan as stated above.  Please sign and either send electronically or make a copy of this report for your files and return this physician signed original.   Please mark one 1.__approve of plan  2. ___approve of plan with the following conditions.   ______________________________                                                          _____________________ Physician Signature  Date  

## 2013-08-11 ENCOUNTER — Ambulatory Visit (HOSPITAL_COMMUNITY): Payer: 59

## 2013-08-13 ENCOUNTER — Ambulatory Visit (HOSPITAL_COMMUNITY)
Admission: RE | Admit: 2013-08-13 | Discharge: 2013-08-13 | Disposition: A | Discharge status: Home / Self Care | Payer: 59 | Source: Ambulatory Visit | Attending: Family Medicine | Admitting: Family Medicine

## 2013-08-13 DIAGNOSIS — M549 Dorsalgia, unspecified: Secondary | ICD-10-CM

## 2013-08-13 NOTE — Progress Notes (Addendum)
Physical Therapy Treatment Patient Details  Name: Rebecca Boyd MRN: [id number] Date of Birth: Aug 26, 1975  Today's Date: 08/13/2013 Time: 3143-8887 PT Time Calculation (min): 42 min Charge: TE [id number], Manual [id number]  Visit#: 2 of 12  Re-eval: 09/06/13 Assessment Diagnosis: back pain  Next MD Visit: Dr. Moshe Cipro MD  appt in june   Authorization: united   Authorization Time Period:    Authorization Visit#:   of     Subjective: Symptoms/Limitations Symptoms: Compliant with HEP daily.  Thoracic pain scale 6/10 today. Pain Assessment Currently in Pain?: Yes Pain Score: 6  Pain Location: Back Pain Orientation: Mid  Objective:  Exercise/Treatments Stretches Prone on Elbows Stretch: 2 reps;60 seconds Standing Functional Squats: 10 reps;Limitations Functional Squats Limitations: with therapist faciliation for form Other Standing Lumbar Exercises: Posture awareness infront of wall,chin tucks with Bil UE flexion, ab set 10x Seated Other Seated Lumbar Exercises: Mid back st 3x 30", Upper trapezius 3s 30" Prone   begin rows, shoulder extension next session. Quadruped Other Quadruped Lumbar Exercises: cat/camel 6 x thoracic stretch   Manual Therapy Manual Therapy: Massage Massage: STM prone to cervoca; amd thoracic mm region to reduce spasms and overall tightness  Physical Therapy Assessment and Plan PT Assessment and Plan Clinical Impression Statement: Began POC to improve thoracic flexibility and instruction of posture awareness and body mechnanics.  Therapist faciliation requiried initially for proper alignment, form and techniuqe.  Pt able to verbalize proper mechanics with squats.  Manual techniques complete to reduce fascial restrictions, spasms in upper and mid trapezius and rhomboid region.  Pt stated pain reduce to 3-4/10 at end of session.   PT Plan: prone and standing postural exercises , manual cervical and thoracic , cervical stretches, thoaricic stretches,  body mechanics education     Goals Home Exercise Program Pt/caregiver will Perform Home Exercise Program: For increased strengthening PT Short Term Goals Time to Complete Short Term Goals: 2 weeks PT Short Term Goal 1: able to stand at work 20 min without increased pain  PT Short Term Goal 2: report reduced pain and stiffness in the morning by 25%  PT Short Term Goal 2 - Progress: Progressing toward goal PT Long Term Goals Time to Complete Long Term Goals: 4 weeks PT Long Term Goal 1: able to stand at work 1 hour with max 2/10 pain  PT Long Term Goal 2: demonstrate proper lifting and carry mechanics for reduce strain on back at work and injury prevention  PT Long Term Goal 2 - Progress: Progressing toward goal Long Term Goal 3: pain average level less than 5/10 for improved quality of life  Long Term Goal 4: drive 30 min with max 1-2/10   PT Long Term Goal 5: patient to place 5 pound items from waist height to above shoulder level 10x without increased pain  Additional PT Long Term Goals?: Yes PT Long Term Goal 6: standing activity at work 1 hour with max 3/10 pain   Problem List Patient Active Problem List   Diagnosis Date Noted  . Pain in thoracic spine 08/07/2013  . Back pain 08/07/2013  . Thoracic back pain 07/30/2013  . Lumbar back pain 07/30/2013  . Menorrhagia 06/12/2013  . Sebaceous cyst of labia 06/12/2013  . Female infertility, primary 06/12/2013  . Bartholin's cyst 06/01/2013  . 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 Headache(784.0) 11/14/2012  . Asthma, mild intermittent 11/14/2012  . Routine general medical examination at a health care facility 09/28/2012  . Urogenital infection by trichomonas vaginalis 09/20/2012  .  Depression 09/18/2012  . Vitamin D deficiency 10/27/2010  . Anxiety 10/26/2010  . IRON DEFICIENCY ANEMIA SECONDARY TO BLOOD LOSS 03/07/2010  . MENORRHAGIA 03/03/2010  . FATIGUE 08/11/2009  . VAGINITIS&VULVOVAGINITIS DISEASES CLASS ELSW 02/12/2008     PT - End of Session Activity Tolerance: Patient tolerated treatment well General Behavior During Therapy: [facility] for tasks assessed/performed  GP    Aldona Lento 08/13/2013, 9:34 AM

## 2013-08-14 ENCOUNTER — Ambulatory Visit (INDEPENDENT_AMBULATORY_CARE_PROVIDER_SITE_OTHER): Payer: 59 | Admitting: Obstetrics and Gynecology

## 2013-08-14 ENCOUNTER — Encounter: Payer: Self-pay | Admitting: Obstetrics and Gynecology

## 2013-08-14 VITALS — BP 138/84 | Ht 60.0 in | Wt 150.0 lb

## 2013-08-14 DIAGNOSIS — L723 Sebaceous cyst: Secondary | ICD-10-CM

## 2013-08-14 DIAGNOSIS — Z01818 Encounter for other preprocedural examination: Secondary | ICD-10-CM

## 2013-08-14 DIAGNOSIS — N920 Excessive and frequent menstruation with regular cycle: Secondary | ICD-10-CM

## 2013-08-14 DIAGNOSIS — Z302 Encounter for sterilization: Secondary | ICD-10-CM

## 2013-08-14 MED ORDER — MEDROXYPROGESTERONE ACETATE 10 MG PO TABS: 10.0000 mg | ORAL_TABLET | Freq: Every day | ORAL | Status: DC

## 2013-08-14 NOTE — Progress Notes (Signed)
SCHEDULED SURGERY PER DR FERGUSON ON 08-26-13.

## 2013-08-14 NOTE — Patient Instructions (Signed)
Endometrial Ablation Endometrial ablation removes the lining of the uterus (endometrium). It is usually a same-day, outpatient treatment. Ablation helps avoid major surgery, such as surgery to remove the cervix and uterus (hysterectomy). After endometrial ablation, you will have little or no menstrual bleeding and may not be able to have children. However, if you are premenopausal, you will need to use a reliable method of birth control following the procedure because of the small chance that pregnancy can occur. There are different reasons to have this procedure, which include:  Heavy periods.  Bleeding that is causing anemia.  Irregular bleeding.  Bleeding fibroids on the lining inside the uterus if they are smaller than 3 centimeters. This procedure should not be done if:  You want children in the future.  You have severe cramps with your menstrual period.  You have precancerous or cancerous cells in your uterus.  You were recently pregnant.  You have gone through menopause.  You have had major surgery on the uterus, such as a cesarean delivery. LET YOUR HEALTH CARE PROVIDER KNOW ABOUT:  Any allergies you have.  All medicines you are taking, including vitamins, herbs, eye drops, creams, and over-the-counter medicines.  Previous problems you or members of your family have had with the use of anesthetics.  Any blood disorders you have.  Previous surgeries you have had.  Medical conditions you have. RISKS AND COMPLICATIONS  Generally, this is a safe procedure. However, as with any procedure, complications can occur. Possible complications include:  Perforation of the uterus.  Bleeding.  Infection of the uterus, bladder, or vagina.  Injury to surrounding organs.  An air bubble to the lung (air embolus).  Pregnancy following the procedure.  Failure of the procedure to help the problem, requiring hysterectomy.  Decreased ability to diagnose cancer in the lining of  the uterus. BEFORE THE PROCEDURE  The lining of the uterus must be tested to make sure there is no pre-cancerous or cancer cells present.  An ultrasound may be performed to look at the size of the uterus and to check for abnormalities.  Medicines may be given to thin the lining of the uterus. PROCEDURE  During the procedure, your health care provider will use a tool called a resectoscope to help see inside your uterus. There are different ways to remove the lining of your uterus.   Radiofrequency  This method uses a radiofrequency-alternating electric current to remove the lining of the uterus.  Cryotherapy This method uses extreme cold to freeze the lining of the uterus.  Heated-Free Liquid  This method uses heated salt (saline) solution to remove the lining of the uterus.  Microwave This method uses high-energy microwaves to heat up the lining of the uterus to remove it.  Thermal balloon  This method involves inserting a catheter with a balloon tip into the uterus. The balloon tip is filled with heated fluid to remove the lining of the uterus. AFTER THE PROCEDURE  After your procedure, do not have sexual intercourse or insert anything into your vagina until permitted by your health care provider. After the procedure, you may experience:  Cramps.  Vaginal discharge.  Frequent urination. Document Released: 02/25/2004 Document Revised: 12/18/2012 Document Reviewed: 09/18/2012 Exit[employer] Patient Information 2014 [employer].  

## 2013-08-14 NOTE — Progress Notes (Signed)
This chart was scribed by Ludger Nutting, Medical Scribe, for Dr. Mallory Shirk on 08/14/13 at 12:43 PM. This chart was reviewed by Dr. Mallory Shirk and is accurate.   [facility] Visit  Patient name: Rebecca Boyd MRN [id number]  Date of birth: [birth year]  CC & HPI:  GLADY OUDERKIRK is a 38 y.o. female presenting today for consultation for endometrial ablation and bilateral salpingectomy. Patient also has a sebaceous cyst that she would like to have removed. She has a history of menorrhagia. Pap current, needs GC/CHL preop  ROS:  Negative except noted above.   Pertinent History Reviewed:  Medical & Surgical Hx:  Reviewed: Significant for  Past Medical History  Diagnosis Date  . Depressive disorder, not elsewhere classified   . Neck pain   . Vaginitis     cyctitis  . Miscarriage 2009  . Anemia     Past Surgical History  Procedure Laterality Date  . Cervical cryotherapy N/A 1999    Medications: Reviewed & Updated - see associated section Social History: Reviewed -  reports that she has quit smoking. Her smoking use included Cigarettes. She smoked 0.00 packs per day. She has never used smokeless tobacco.  Objective Findings:  Vitals: BP 138/84  Ht 5' (1.524 m)  Wt 150 lb (68.04 kg)  BMI 29.30 kg/m2  LMP 08/13/2013  Physical Examination: General appearance - alert, well appearing, and in no distress and oriented to person, place, and time Pelvic - examination not indicated   Assessment & Plan:   A: menorrhagia, desire for sterilization, vulvar sebaceous cyst.  P: 1. Endometrial ablation with sebaceous cyst removal 2. Return for pre-op visit 72 hrs before preop labs at hospital. Will do GC chl at that time

## 2013-08-15 ENCOUNTER — Encounter (HOSPITAL_COMMUNITY): Payer: Self-pay | Admitting: Pharmacy Technician

## 2013-08-19 ENCOUNTER — Ambulatory Visit (HOSPITAL_COMMUNITY)
Admission: RE | Admit: 2013-08-19 | Discharge: 2013-08-19 | Disposition: A | Discharge status: Home / Self Care | Payer: 59 | Source: Ambulatory Visit | Attending: Family Medicine | Admitting: Family Medicine

## 2013-08-19 ENCOUNTER — Other Ambulatory Visit: Payer: Self-pay | Admitting: Obstetrics and Gynecology

## 2013-08-19 NOTE — Progress Notes (Signed)
Physical Therapy Treatment Patient Details  Name: Rebecca Boyd MRN: [id number] Date of Birth: 03-12-1976  Today's Date: 08/19/2013 Time: 0800-0840 PT Time Calculation (min): 40 min Visit#: 3 of 12  Re-eval: 09/06/13 Authorization: united  Charges:  therex [phone] (25'), manual [phone] (10')     Subjective: Symptoms/Limitations Symptoms: Pt states her pain remains constant, reports 6/10 pain today in mid scapular region. Pain Assessment Currently in Pain?: Yes Pain Score: 6  Pain Location: Scapula Pain Orientation: Mid   Exercise/Treatments Stretches Prone on Elbows Stretch: 1 rep;Limitations Prone on Elbows Stretch Limitations: 2 minutes Standing Other Standing Lumbar Exercises: Posture awareness infront of wall,chin tucks with Bil UE flexion Corner stretch 3X30" Seated UBE 4' backward Other Seated Lumbar Exercises: Mid back st 2x 30", Upper trapezius 2x 30" Prone  Other Prone Lumbar Exercises: w-back 10 reps, rows 10 reps, shoulder extension 10 reps    Manual Therapy Manual Therapy: Massage Massage: STM prone to cervical and  thoracic mm region to reduce spasms and overall tightness   Physical Therapy Assessment and Plan PT Assessment and Plan Clinical Impression Statement: Began prone scapular stabilization exercises with vc's for form.  Improved posture today with standing therex.  continued with manual techniques to reduce fascial restrictions and spasms in upper/mid trap and rhomboid regions bilaterally.  Spasms resolved in these areas. PT Plan: continue manual techniques and progress scapular stabilization and strength. Next session focus on body mechanics education      Problem List Patient Active Problem List   Diagnosis Date Noted  . Pain in thoracic spine 08/07/2013  . Back pain 08/07/2013  . Thoracic back pain 07/30/2013  . Lumbar back pain 07/30/2013  . Menorrhagia 06/12/2013  . Sebaceous cyst of labia 06/12/2013  . Female infertility, primary  06/12/2013  . Bartholin's cyst 06/01/2013  . 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 Headache(784.0) 11/14/2012  . Asthma, mild intermittent 11/14/2012  . Routine general medical examination at a health care facility 09/28/2012  . Urogenital infection by trichomonas vaginalis 09/20/2012  . Depression 09/18/2012  . Vitamin D deficiency 10/27/2010  . Anxiety 10/26/2010  . IRON DEFICIENCY ANEMIA SECONDARY TO BLOOD LOSS 03/07/2010  . MENORRHAGIA 03/03/2010  . FATIGUE 08/11/2009  . VAGINITIS&VULVOVAGINITIS DISEASES CLASS ELSW 02/12/2008    PT - End of Session Activity Tolerance: Patient tolerated treatment well General Behavior During Therapy: [facility] for tasks assessed/performed  GP    Teena Irani, PTA/CLT 08/19/2013, 8:38 AM

## 2013-08-20 ENCOUNTER — Ambulatory Visit (INDEPENDENT_AMBULATORY_CARE_PROVIDER_SITE_OTHER): Payer: 59 | Admitting: Obstetrics and Gynecology

## 2013-08-20 ENCOUNTER — Encounter: Payer: Self-pay | Admitting: Obstetrics and Gynecology

## 2013-08-20 VITALS — BP 130/72 | Ht 63.0 in | Wt 151.0 lb

## 2013-08-20 DIAGNOSIS — N920 Excessive and frequent menstruation with regular cycle: Secondary | ICD-10-CM

## 2013-08-20 DIAGNOSIS — Z01818 Encounter for other preprocedural examination: Secondary | ICD-10-CM

## 2013-08-20 DIAGNOSIS — L723 Sebaceous cyst: Secondary | ICD-10-CM

## 2013-08-20 DIAGNOSIS — Z01419 Encounter for gynecological examination (general) (routine) without abnormal findings: Secondary | ICD-10-CM

## 2013-08-20 DIAGNOSIS — Z0000 Encounter for general adult medical examination without abnormal findings: Secondary | ICD-10-CM

## 2013-08-20 NOTE — Patient Instructions (Signed)
Report to hospital in 2 days (Friday) for pre-op blood work

## 2013-08-20 NOTE — Progress Notes (Signed)
This chart was scribed by Jenne Campus, Medical Scribe, for Dr. Mallory Shirk on 08/20/13 at 9:23 AM. This chart was reviewed by Dr. Mallory Shirk and is accurate.  Assessment:  Pre-op visit for endometrial ablation and sebaceous cyst removal on 08/26/13 Annual Exam Plan:  1. pap smear not done, next pap due 2 years 2. return annually or prn 3.   Annual mammogram advised 4.   Report to [facility] in 2 days for pre-op blood work 5.   F/u is scheduled for 09/03/13 Subjective:  Rebecca Boyd is a 38 y.o. female No obstetric history on file. who presents for annual exam and pre-op visit. Patient's last menstrual period was 08/11/2013. Using visit today as pre-op visit for endometrial ablation and sebaceous cyst removal scheduled in one week. Last PAP was last year and was normal. Reports one prior abnormal PAP with subsequent pinch biopsy 10 years ago. Has blood work done through PCP, last visit was one month ago for annual visit. The patient has no new complaints today.   The following portions of the patient's history were reviewed and updated as appropriate: allergies, current medications, past family history, past medical history, past social history, past surgical history and problem list.  Review of Systems Constitutional: negative Gastrointestinal: negative Genitourinary: menorrhagia, reports using 47 pads during menses over the course of a week  Objective:  BP 130/72  Ht 5\' 3"  (1.6 m)  Wt 151 lb (68.493 kg)  BMI 26.76 kg/m2  LMP 08/11/2013   BMI: Body mass index is 26.76 kg/(m^2).  Chaperone present for exam which was performed with pt's permission General Appearance: Alert, appropriate appearance for age. No acute distress HEENT: Grossly normal Neck / Thyroid:  Cardiovascular: RRR; normal S1, S2, no murmur Lungs: CTA bilaterally Back: No CVAT Breast Exam: No dimpling, nipple retraction or discharge. No masses or nodes., Normal to inspection and Normal breast tissue  bilaterally Gastrointestinal: Soft, non-tender, no masses or organomegaly Pelvic Exam: External genitalia: sebaceous cyst to the left labia minora  Urinary system: urethral meatus normal Vaginal: normal mucosa without prolapse or lesions and normal without tenderness, induration or masses Cervix: normal appearance Adnexa: normal bimanual exam Uterus: normal single, non-tender, well supported  Rectal: not done Lymphatic Exam: Non-palpable nodes in neck, clavicular, axillary, or inguinal regions  Skin: no rash or abnormalities Neurologic: Normal gait and speech, no tremor  Psychiatric: Alert and oriented, appropriate affect.  Urinalysis:Not done  Mallory Shirk. MD Pgr [phone] 9:38 AM

## 2013-08-21 ENCOUNTER — Ambulatory Visit (HOSPITAL_COMMUNITY): Payer: 59 | Admitting: Psychiatry

## 2013-08-21 ENCOUNTER — Telehealth (HOSPITAL_COMMUNITY): Payer: Self-pay | Admitting: *Deleted

## 2013-08-21 ENCOUNTER — Inpatient Hospital Stay (HOSPITAL_COMMUNITY): Admission: RE | Admit: 2013-08-21 | Payer: 59 | Source: Ambulatory Visit | Admitting: *Deleted

## 2013-08-21 NOTE — Patient Instructions (Signed)
Rebecca Boyd  08/21/2013   Your procedure is scheduled on:   08/26/2013  Report to [facility], [place] at  1100  AM.  Call this number if you have problems the morning of surgery: [phone]   Remember:   Do not eat food or drink liquids after midnight.   Take these medicines the morning of surgery with A SIP OF WATER:  Xanax, flexaril, prozac, neurontin. Take your inhaler before you come.   Do not wear jewelry, make-up or nail polish.  Do not wear lotions, powders, or perfumes.   Do not shave 48 hours prior to surgery. Men may shave face and neck.  Do not bring valuables to the hospital.  [facility] is not responsible for any belongings or valuables.               Contacts, dentures or bridgework may not be worn into surgery.  Leave suitcase in the car. After surgery it may be brought to your room.  For patients admitted to the hospital, discharge time is determined by your treatment team.               Patients discharged the day of surgery will not be allowed to drive home.  Name and phone number of your driver: family  Special Instructions: Shower using CHG 2 nights before surgery and the night before surgery.  If you shower the day of surgery use CHG.  Use special wash - you have one bottle of CHG for all showers.  You should use approximately 1/3 of the bottle for each shower.   Please read over the following fact sheets that you were given: Pain Booklet, Coughing and Deep Breathing, Surgical Site Infection Prevention, Anesthesia Post-op Instructions and Care and Recovery After Surgery Cyst Removal Your caregiver has removed a cyst. A cyst is a sac containing a semi-solid material. Cysts may occur any place on your body. They may remain small for years or gradually get larger. A sebaceous cyst is an enlarged (dilated) sweat gland filled with old sweat (sebum). Unattended, these may become large (the size of a softball) over several years time. These are often removed for  improved appearance (cosmetic) reasons or before they become infected to form an abscess. An abscess is an infected cyst. HOME CARE INSTRUCTIONS   Keep your bandage clean and dry. You may change your bandage after 24 hours. If your bandage sticks, use warm water to gently loosen it. Pat the area dry with a clean towel before putting on another bandage.  If possible, keep the area where the cyst was removed raised to relieve soreness, swelling, and promote healing.  If you have stitches, keep them clean and dry.  You may clean your stitches gently with a cotton swab dipped in warm soapy water.  Do not soak the area where the cyst was removed or go swimming. You may shower.  Do not overuse the area where your cyst was removed.  Return in 7 days or as directed to have your stitches removed.  Take medicines as instructed by your caregiver. SEEK IMMEDIATE MEDICAL CARE IF:   An oral temperature above 102 F (38.9 C) develops, not controlled by medication.  Blood continues to soak through the bandage.  You have increasing pain in the area where your cyst was removed.  You have redness, swelling, pus, a bad smell, soreness (inflammation), or red streaks coming away from the stitches. These are signs of  infection. MAKE SURE YOU:   Understand these instructions.  Will watch your condition.  Will get help right away if you are not doing well or get worse. Document Released: 04/14/2000 Document Revised: 07/10/2011 Document Reviewed: 08/08/2007 [facility] Patient Information 2014 [place]. Salpingectomy Salpingectomy, also called tubectomy, is the surgical removal of one of the fallopian tubes. The fallopian tubes are tubes that are connected to the uterus. These tubes transport the egg from the ovary to the uterus. A salpingectomy may be done for various reasons, including:   A tubal (ectopic) pregnancy. This is especially true if the tube ruptures.  An infected fallopian  tube.  The need to remove the fallopian tube when removing an ovary with a cyst or tumor.  The need to remove the fallopian tube when removing the uterus.  Cancer of the fallopian tube or nearby organs. Removing one fallopian tube does not prevent you from becoming pregnant. It also does not cause problems with your menstrual periods.  LET [facility] CARE PROVIDER KNOW ABOUT:  Any allergies you have.  All medicines you are taking, including vitamins, herbs, eye drops, creams, and over-the-counter medicines.  Previous problems you or members of your family have had with the use of anesthetics.  Any blood disorders you have.  Previous surgeries you have had.  Medical conditions you have. RISKS AND COMPLICATIONS  Generally, this is a safe procedure. However, as with any procedure, complications can occur. Possible complications include:  Injury to surrounding organs.  Bleeding.  Infection.  Problems related to anesthesia. BEFORE THE PROCEDURE  Ask your health care provider about changing or stopping your regular medicines. You may need to stop taking certain medicines, such as aspirin or blood thinners, at least 1 week before the surgery.  Do not eat or drink anything for at least 8 hours before the surgery.  If you smoke, do not smoke for at least 2 weeks before the surgery.  Make plans to have someone drive you home after the procedure or after your hospital stay. Also arrange for someone to help you with activities during recovery. PROCEDURE   You will be given medicine to help you relax before the procedure (sedative). You will then be given medicine to make you sleep through the procedure (general anesthetic). These medicines will be given through an IV access tube that is put into one of your veins.  Once you are asleep, your lower abdomen will be shaved and cleaned. A thin, flexible tube (catheter) will be placed in your bladder.  The surgeon may use a laparoscopic,  robotic, or open technique for this surgery:  In the laparoscopic technique, the surgery is done through two small cuts (incisions) in the abdomen. A thin, lighted tube with a tiny camera on the end (laparoscope) is inserted into one of the incisions. The tools needed for the procedure are put through the other incision.  A robotic technique may be chosen to perform complex surgery in a small space. In the robotic technique, small incisions will be made. A camera and surgical instruments are passed through the incisions. Surgical instruments will be controlled with the help of a robotic arm.  In the open technique, the surgery is done through one large incision in the abdomen.  Using any of these techniques, the surgeon removes the fallopian tube from where it attaches to the uterus. The blood vessels will be clamped and tied.  The surgeon then uses staples or stitches to close the incision or incisions. AFTER  THE PROCEDURE   You will be taken to a recovery area where your progress will be monitored for 1 3 hours.  If the laparoscopic technique was used, you may be allowed to go home after several hours. You may have some shoulder pain after the laparoscopic procedure. This is normal and usually goes away in a day or two.  If the open technique was used, you will be admitted to the hospital for a couple of days.  You will be given pain medicine if needed.  The IV access tube and catheter will be removed before you are discharged. Document Released: 09/03/2008 Document Revised: 02/05/2013 Document Reviewed: 10/09/2012 [facility] Patient Information 2014 [place], [place]. Dilation and Curettage or Vacuum Curettage Dilation and curettage (D&C) and vacuum curettage are minor procedures. A D&C involves stretching (dilation) the cervix and scraping (curettage) the inside lining of the womb (uterus). During a D&C, tissue is gently scraped from the inside lining of the uterus. During a vacuum  curettage, the lining and tissue in the uterus are removed with the use of gentle suction.  Curettage may be performed to either diagnose or treat a problem. As a diagnostic procedure, curettage is performed to examine tissues from the uterus. A diagnostic curettage may be performed for the following symptoms:   Irregular bleeding in the uterus.   Bleeding with the development of clots.   Spotting between menstrual periods.   Prolonged menstrual periods.   Bleeding after menopause.   No menstrual period (amenorrhea).   A change in size and shape of the uterus.  As a treatment procedure, curettage may be performed for the following reasons:   Removal of an IUD (intrauterine device).   Removal of retained placenta after giving birth. Retained placenta can cause an infection or bleeding severe enough to require transfusions.   Abortion.   Miscarriage.   Removal of polyps inside the uterus.   Removal of uncommon types of noncancerous lumps (fibroids).  LET [facility] CARE PROVIDER KNOW ABOUT:   Any allergies you have.   All medicines you are taking, including vitamins, herbs, eye drops, creams, and over-the-counter medicines.   Previous problems you or members of your family have had with the use of anesthetics.   Any blood disorders you have.   Previous surgeries you have had.   Medical conditions you have. RISKS AND COMPLICATIONS  Generally, this is a safe procedure. However, as with any procedure, complications can occur. Possible complications include:  Excessive bleeding.   Infection of the uterus.   Damage to the cervix.   Development of scar tissue (adhesions) inside the uterus, later causing abnormal amounts of menstrual bleeding.   Complications from the general anesthetic, if a general anesthetic is used.   Putting a hole (perforation) in the uterus. This is rare.  BEFORE THE PROCEDURE   Eat and drink before the procedure only  as directed by your health care provider.   Arrange for someone to take you home.  PROCEDURE  This procedure usually takes about 15 30 minutes.  You will be given one of the following:  A medicine that numbs the area in and around the cervix (local anesthetic).   A medicine to make you sleep through the procedure (general anesthetic).  You will lie on your back with your legs in stirrups.   A warm metal or plastic instrument (speculum) will be placed in your vagina to keep it open and to allow the health care provider to see the cervix.  There are two ways in which your cervix can be softened and dilated. These include:   Taking a medicine.   Having thin rods (laminaria) inserted into your cervix.   A curved tool (curette) will be used to scrape cells from the inside lining of the uterus. In some cases, gentle suction is applied with the curette. The curette will then be removed.  AFTER THE PROCEDURE   You will rest in the recovery area until you are stable and are ready to go home.   You may feel sick to your stomach (nauseous) or throw up (vomit) if you were given a general anesthetic.   You may have a sore throat if a tube was placed in your throat during general anesthesia.   You may have light cramping and bleeding. This may last for 2 days to 2 weeks after the procedure.   Your uterus needs to make a new lining after the procedure. This may make your next period late. Document Released: 04/17/2005 Document Revised: 12/18/2012 Document Reviewed: 11/14/2012 [facility] Patient Information 2014 [place], [place]. PATIENT INSTRUCTIONS POST-ANESTHESIA  IMMEDIATELY FOLLOWING SURGERY:  Do not drive or operate machinery for the first twenty four hours after surgery.  Do not make any important decisions for twenty four hours after surgery or while taking narcotic pain medications or sedatives.  If you develop intractable nausea and vomiting or a severe headache please  notify your doctor immediately.  FOLLOW-UP:  Please make an appointment with your surgeon as instructed. You do not need to follow up with anesthesia unless specifically instructed to do so.  WOUND CARE INSTRUCTIONS (if applicable):  Keep a dry clean dressing on the anesthesia/puncture wound site if there is drainage.  Once the wound has quit draining you may leave it open to air.  Generally you should leave the bandage intact for twenty four hours unless there is drainage.  If the epidural site drains for more than 36-48 hours please call the anesthesia department.  QUESTIONS?:  Please feel free to call your physician or the hospital operator if you have any questions, and they will be happy to assist you.

## 2013-08-22 ENCOUNTER — Encounter (HOSPITAL_COMMUNITY)
Admission: RE | Admit: 2013-08-22 | Discharge: 2013-08-22 | Disposition: A | Discharge status: Home / Self Care | Payer: 59 | Source: Ambulatory Visit | Attending: Obstetrics and Gynecology | Admitting: Obstetrics and Gynecology

## 2013-08-22 ENCOUNTER — Encounter (HOSPITAL_COMMUNITY): Payer: Self-pay

## 2013-08-22 DIAGNOSIS — Z01812 Encounter for preprocedural laboratory examination: Secondary | ICD-10-CM | POA: Insufficient documentation

## 2013-08-22 HISTORY — DX: Anxiety disorder, unspecified: F41.9

## 2013-08-22 LAB — BASIC METABOLIC PANEL
BUN: 12 mg/dL (ref 6–23)
CHLORIDE: 105 meq/L (ref 96–112)
CO2: 23 meq/L (ref 19–32)
Calcium: 9.2 mg/dL (ref 8.4–10.5)
Creatinine, Ser: 0.76 mg/dL (ref 0.50–1.10)
GFR calc Af Amer: >90 mL/min (ref >90)
GFR calc non Af Amer: >90 mL/min (ref >90)
Glucose, Bld: 94 mg/dL (ref 70–99)
Potassium: 4.3 mEq/L (ref 3.7–5.3)
Sodium: 139 mEq/L (ref 137–147)

## 2013-08-22 LAB — URINALYSIS, ROUTINE W REFLEX MICROSCOPIC
Bilirubin Urine: NEGATIVE
GLUCOSE, UA: NEGATIVE mg/dL
Hgb urine dipstick: NEGATIVE
Ketones, ur: NEGATIVE mg/dL
LEUKOCYTES UA: NEGATIVE
Nitrite: NEGATIVE
PH: 6 (ref 5.0–8.0)
Protein, ur: NEGATIVE mg/dL
Specific Gravity, Urine: 1.025 (ref 1.005–1.030)
Urobilinogen, UA: 0.2 mg/dL (ref 0.0–1.0)

## 2013-08-22 LAB — CBC
HEMATOCRIT: 27.6 % — AB (ref 36.0–46.0)
HEMOGLOBIN: 8.3 g/dL — AB (ref 12.0–15.0)
MCH: 20 pg — ABNORMAL LOW (ref 26.0–34.0)
MCHC: 30.1 g/dL (ref 30.0–36.0)
MCV: 66.3 fL — ABNORMAL LOW (ref 78.0–100.0)
Platelets: 211 10*3/uL (ref 150–400)
RBC: 4.16 MIL/uL (ref 3.87–5.11)
RDW: 19.9 % — ABNORMAL HIGH (ref 11.5–15.5)
WBC: 4.5 10*3/uL (ref 4.0–10.5)

## 2013-08-22 LAB — HCG, SERUM, QUALITATIVE: Preg, Serum: NEGATIVE

## 2013-08-24 ENCOUNTER — Encounter (HOSPITAL_COMMUNITY): Payer: Self-pay | Admitting: Emergency Medicine

## 2013-08-24 ENCOUNTER — Emergency Department (HOSPITAL_COMMUNITY)
Admission: EM | Admit: 2013-08-24 | Discharge: 2013-08-24 | Disposition: A | Discharge status: Home / Self Care | Payer: 59 | Attending: Emergency Medicine | Admitting: Emergency Medicine

## 2013-08-24 ENCOUNTER — Emergency Department (HOSPITAL_COMMUNITY): Payer: 59

## 2013-08-24 DIAGNOSIS — Z8742 Personal history of other diseases of the female genital tract: Secondary | ICD-10-CM | POA: Insufficient documentation

## 2013-08-24 DIAGNOSIS — Y9389 Activity, other specified: Secondary | ICD-10-CM | POA: Insufficient documentation

## 2013-08-24 DIAGNOSIS — Z8619 Personal history of other infectious and parasitic diseases: Secondary | ICD-10-CM | POA: Insufficient documentation

## 2013-08-24 DIAGNOSIS — F17200 Nicotine dependence, unspecified, uncomplicated: Secondary | ICD-10-CM | POA: Insufficient documentation

## 2013-08-24 DIAGNOSIS — S0993XA Unspecified injury of face, initial encounter: Secondary | ICD-10-CM | POA: Insufficient documentation

## 2013-08-24 DIAGNOSIS — S335XXA Sprain of ligaments of lumbar spine, initial encounter: Secondary | ICD-10-CM | POA: Insufficient documentation

## 2013-08-24 DIAGNOSIS — Z79899 Other long term (current) drug therapy: Secondary | ICD-10-CM | POA: Insufficient documentation

## 2013-08-24 DIAGNOSIS — S199XXA Unspecified injury of neck, initial encounter: Secondary | ICD-10-CM

## 2013-08-24 DIAGNOSIS — S39012A Strain of muscle, fascia and tendon of lower back, initial encounter: Secondary | ICD-10-CM

## 2013-08-24 DIAGNOSIS — Y92410 Unspecified street and highway as the place of occurrence of the external cause: Secondary | ICD-10-CM | POA: Insufficient documentation

## 2013-08-24 DIAGNOSIS — F329 Major depressive disorder, single episode, unspecified: Secondary | ICD-10-CM | POA: Insufficient documentation

## 2013-08-24 DIAGNOSIS — Z862 Personal history of diseases of the blood and blood-forming organs and certain disorders involving the immune mechanism: Secondary | ICD-10-CM | POA: Insufficient documentation

## 2013-08-24 DIAGNOSIS — F3289 Other specified depressive episodes: Secondary | ICD-10-CM | POA: Insufficient documentation

## 2013-08-24 DIAGNOSIS — F411 Generalized anxiety disorder: Secondary | ICD-10-CM | POA: Insufficient documentation

## 2013-08-24 MED ORDER — KETOROLAC TROMETHAMINE 10 MG PO TABS
10.0000 mg | ORAL_TABLET | Freq: Once | ORAL | Status: AC
  Administered 2013-08-24: 10 mg via ORAL
  Filled 2013-08-24: qty 1

## 2013-08-24 MED ORDER — DIAZEPAM 5 MG PO TABS
5.0000 mg | ORAL_TABLET | Freq: Once | ORAL | Status: AC
  Administered 2013-08-24: 5 mg via ORAL
  Filled 2013-08-24: qty 1

## 2013-08-24 MED ORDER — DICLOFENAC SODIUM 75 MG PO TBEC: 75.0000 mg | DELAYED_RELEASE_TABLET | Freq: Two times a day (BID) | ORAL | Status: DC

## 2013-08-24 MED ORDER — HYDROCODONE-ACETAMINOPHEN 5-325 MG PO TABS: 1.0000 | ORAL_TABLET | ORAL | Status: DC | PRN

## 2013-08-24 MED ORDER — HYDROCODONE-ACETAMINOPHEN 5-325 MG PO TABS
1.0000 | ORAL_TABLET | Freq: Once | ORAL | Status: AC
  Administered 2013-08-24: 1 via ORAL
  Filled 2013-08-24: qty 1

## 2013-08-24 MED ORDER — METHOCARBAMOL 500 MG PO TABS: 500.0000 mg | ORAL_TABLET | Freq: Three times a day (TID) | ORAL | Status: DC
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
## 2013-08-24 NOTE — ED Provider Notes (Signed)
Medical screening examination/treatment/procedure(s) were performed by non-physician practitioner and as supervising physician I was immediately available for consultation/collaboration.   EKG Interpretation None        F[place]er [place] L Admiral Marcucci, MD 08/24/13 [id number] 
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
## 2013-08-25 ENCOUNTER — Telehealth: Payer: Self-pay | Admitting: Obstetrics and Gynecology

## 2013-08-25 NOTE — Telephone Encounter (Signed)
Note left at front desk for pt to pick up stating pt to be excused from work 04/28 through 09/03/2013. Pt having surgery with Dr. Glo Herring on 04/28.

## 2013-08-25 NOTE — Progress Notes (Addendum)
Hem 8.3 and HCT 27.6 reported to [facility]/Dr Glo Herring and Dr Patsey Berthold. ISTAT and Type and screen on arrival.
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
## 2013-08-26 ENCOUNTER — Encounter (HOSPITAL_COMMUNITY): Payer: 59 | Admitting: Anesthesiology

## 2013-08-26 ENCOUNTER — Encounter (HOSPITAL_COMMUNITY): Payer: Self-pay | Admitting: *Deleted

## 2013-08-26 ENCOUNTER — Encounter (HOSPITAL_COMMUNITY)
Admission: RE | Disposition: A | Discharge status: Home / Self Care | Payer: Self-pay | Source: Ambulatory Visit | Attending: Obstetrics and Gynecology

## 2013-08-26 ENCOUNTER — Ambulatory Visit (HOSPITAL_COMMUNITY): Payer: 59 | Admitting: Anesthesiology

## 2013-08-26 ENCOUNTER — Ambulatory Visit (HOSPITAL_COMMUNITY): Payer: 59

## 2013-08-26 ENCOUNTER — Ambulatory Visit (HOSPITAL_COMMUNITY)
Admission: RE | Admit: 2013-08-26 | Discharge: 2013-08-26 | Disposition: A | Discharge status: Home / Self Care | Payer: 59 | Source: Ambulatory Visit | Attending: Obstetrics and Gynecology | Admitting: Obstetrics and Gynecology

## 2013-08-26 DIAGNOSIS — N907 Vulvar cyst: Secondary | ICD-10-CM

## 2013-08-26 DIAGNOSIS — L723 Sebaceous cyst: Secondary | ICD-10-CM | POA: Insufficient documentation

## 2013-08-26 DIAGNOSIS — N920 Excessive and frequent menstruation with regular cycle: Secondary | ICD-10-CM

## 2013-08-26 DIAGNOSIS — N739 Female pelvic inflammatory disease, unspecified: Secondary | ICD-10-CM

## 2013-08-26 DIAGNOSIS — D649 Anemia, unspecified: Secondary | ICD-10-CM | POA: Insufficient documentation

## 2013-08-26 DIAGNOSIS — D251 Intramural leiomyoma of uterus: Secondary | ICD-10-CM | POA: Insufficient documentation

## 2013-08-26 DIAGNOSIS — Z302 Encounter for sterilization: Secondary | ICD-10-CM

## 2013-08-26 DIAGNOSIS — Z79899 Other long term (current) drug therapy: Secondary | ICD-10-CM | POA: Insufficient documentation

## 2013-08-26 DIAGNOSIS — D500 Iron deficiency anemia secondary to blood loss (chronic): Secondary | ICD-10-CM

## 2013-08-26 DIAGNOSIS — N838 Other noninflammatory disorders of ovary, fallopian tube and broad ligament: Secondary | ICD-10-CM

## 2013-08-26 DIAGNOSIS — N9089 Other specified noninflammatory disorders of vulva and perineum: Secondary | ICD-10-CM

## 2013-08-26 DIAGNOSIS — N7013 Chronic salpingitis and oophoritis: Secondary | ICD-10-CM | POA: Insufficient documentation

## 2013-08-26 DIAGNOSIS — F411 Generalized anxiety disorder: Secondary | ICD-10-CM | POA: Insufficient documentation

## 2013-08-26 DIAGNOSIS — F17200 Nicotine dependence, unspecified, uncomplicated: Secondary | ICD-10-CM | POA: Insufficient documentation

## 2013-08-26 HISTORY — PX: VULVAR LESION REMOVAL: SHX5391

## 2013-08-26 HISTORY — PX: LAPAROSCOPIC BILATERAL SALPINGECTOMY: SHX5889

## 2013-08-26 HISTORY — PX: DILITATION & CURRETTAGE/HYSTROSCOPY WITH THERMACHOICE ABLATION: SHX5569

## 2013-08-26 HISTORY — PX: LAPAROSCOPIC LYSIS OF ADHESIONS: SHX5905

## 2013-08-26 LAB — TYPE AND SCREEN
ABO/RH(D): B POS
Antibody Screen: NEGATIVE

## 2013-08-26 LAB — HEMOGLOBIN AND HEMATOCRIT, BLOOD
HCT: 27.5 % — ABNORMAL LOW (ref 36.0–46.0)
Hemoglobin: 8.6 g/dL — ABNORMAL LOW (ref 12.0–15.0)

## 2013-08-26 SURGERY — SALPINGECTOMY, BILATERAL, LAPAROSCOPIC
Anesthesia: General

## 2013-08-26 MED ORDER — MIDAZOLAM HCL 2 MG/2ML IJ SOLN
INTRAMUSCULAR | Status: AC
  Filled 2013-08-26: qty 2

## 2013-08-26 MED ORDER — SODIUM CHLORIDE 0.9 % IR SOLN
Status: DC | PRN
  Administered 2013-08-26: 1000 mL

## 2013-08-26 MED ORDER — KETOROLAC TROMETHAMINE 30 MG/ML IJ SOLN
30.0000 mg | Freq: Once | INTRAMUSCULAR | Status: AC
  Administered 2013-08-26: 30 mg via INTRAVENOUS
  Filled 2013-08-26: qty 1

## 2013-08-26 MED ORDER — NEOSTIGMINE METHYLSULFATE 1 MG/ML IJ SOLN
INTRAMUSCULAR | Status: DC | PRN
  Administered 2013-08-26: 2 mg via INTRAVENOUS

## 2013-08-26 MED ORDER — FENTANYL CITRATE 0.05 MG/ML IJ SOLN
INTRAMUSCULAR | Status: AC
  Filled 2013-08-26: qty 5

## 2013-08-26 MED ORDER — ONDANSETRON HCL 4 MG/2ML IJ SOLN: 4.0000 mg | Freq: Once | INTRAMUSCULAR | Status: DC | PRN

## 2013-08-26 MED ORDER — LIDOCAINE HCL (PF) 1 % IJ SOLN
INTRAMUSCULAR | Status: AC
  Filled 2013-08-26: qty 5

## 2013-08-26 MED ORDER — GLYCOPYRROLATE 0.2 MG/ML IJ SOLN
INTRAMUSCULAR | Status: AC
  Filled 2013-08-26: qty 2

## 2013-08-26 MED ORDER — KETOROLAC TROMETHAMINE 10 MG PO TABS: 10.0000 mg | ORAL_TABLET | Freq: Four times a day (QID) | ORAL | Status: DC | PRN

## 2013-08-26 MED ORDER — DEXTROSE 5 % IV SOLN
INTRAVENOUS | Status: DC | PRN
  Administered 2013-08-26: 13 mL via INTRAVENOUS

## 2013-08-26 MED ORDER — ONDANSETRON HCL 4 MG/2ML IJ SOLN
4.0000 mg | Freq: Once | INTRAMUSCULAR | Status: AC
  Administered 2013-08-26: 4 mg via INTRAVENOUS
  Filled 2013-08-26: qty 2

## 2013-08-26 MED ORDER — MIDAZOLAM HCL 2 MG/2ML IJ SOLN
1.0000 mg | INTRAMUSCULAR | Status: DC | PRN
  Administered 2013-08-26: 2 mg via INTRAVENOUS
  Filled 2013-08-26: qty 2

## 2013-08-26 MED ORDER — FENTANYL CITRATE 0.05 MG/ML IJ SOLN
25.0000 ug | INTRAMUSCULAR | Status: DC | PRN
  Administered 2013-08-26 (×4): 50 ug via INTRAVENOUS
  Filled 2013-08-26 (×2): qty 2

## 2013-08-26 MED ORDER — SUCCINYLCHOLINE CHLORIDE 20 MG/ML IJ SOLN
INTRAMUSCULAR | Status: AC
  Filled 2013-08-26: qty 1

## 2013-08-26 MED ORDER — ALBUTEROL SULFATE (2.5 MG/3ML) 0.083% IN NEBU
2.5000 mg | INHALATION_SOLUTION | Freq: Once | RESPIRATORY_TRACT | Status: AC
  Administered 2013-08-26: 2.5 mg via RESPIRATORY_TRACT
  Filled 2013-08-26: qty 3

## 2013-08-26 MED ORDER — FENTANYL CITRATE 0.05 MG/ML IJ SOLN
INTRAMUSCULAR | Status: AC
  Filled 2013-08-26: qty 2

## 2013-08-26 MED ORDER — 0.9 % SODIUM CHLORIDE (POUR BTL) OPTIME
TOPICAL | Status: DC | PRN
  Administered 2013-08-26: 1000 mL

## 2013-08-26 MED ORDER — GLYCOPYRROLATE 0.2 MG/ML IJ SOLN
INTRAMUSCULAR | Status: DC | PRN
  Administered 2013-08-26: .3 mg via INTRAVENOUS

## 2013-08-26 MED ORDER — PROPOFOL 10 MG/ML IV BOLUS
INTRAVENOUS | Status: AC
  Filled 2013-08-26: qty 20

## 2013-08-26 MED ORDER — MIDAZOLAM HCL 5 MG/5ML IJ SOLN
INTRAMUSCULAR | Status: DC | PRN
  Administered 2013-08-26: 2 mg via INTRAVENOUS

## 2013-08-26 MED ORDER — OXYCODONE-ACETAMINOPHEN 5-325 MG PO TABS: 1.0000 | ORAL_TABLET | ORAL | Status: DC | PRN

## 2013-08-26 MED ORDER — BUPIVACAINE-EPINEPHRINE PF 0.5-1:200000 % IJ SOLN
INTRAMUSCULAR | Status: AC
  Filled 2013-08-26: qty 30

## 2013-08-26 MED ORDER — ROCURONIUM BROMIDE 50 MG/5ML IV SOLN
INTRAVENOUS | Status: AC
  Filled 2013-08-26: qty 1

## 2013-08-26 MED ORDER — CEFAZOLIN SODIUM-DEXTROSE 2-3 GM-% IV SOLR
2.0000 g | INTRAVENOUS | Status: AC
  Administered 2013-08-26: 2 g via INTRAVENOUS
  Filled 2013-08-26: qty 50

## 2013-08-26 MED ORDER — LACTATED RINGERS IV SOLN
INTRAVENOUS | Status: DC
  Administered 2013-08-26 (×2): via INTRAVENOUS

## 2013-08-26 MED ORDER — BUPIVACAINE-EPINEPHRINE PF 0.5-1:200000 % IJ SOLN
INTRAMUSCULAR | Status: DC | PRN
  Administered 2013-08-26: 26 mL
  Administered 2013-08-26: 4 mL

## 2013-08-26 MED ORDER — PROPOFOL 10 MG/ML IV BOLUS
INTRAVENOUS | Status: DC | PRN
  Administered 2013-08-26: 120 mg via INTRAVENOUS

## 2013-08-26 MED ORDER — FENTANYL CITRATE 0.05 MG/ML IJ SOLN
INTRAMUSCULAR | Status: DC | PRN
  Administered 2013-08-26 (×7): 50 ug via INTRAVENOUS

## 2013-08-26 MED ORDER — LIDOCAINE HCL 1 % IJ SOLN
INTRAMUSCULAR | Status: DC | PRN
  Administered 2013-08-26: 30 mg via INTRADERMAL

## 2013-08-26 MED ORDER — SODIUM CHLORIDE 0.9 % IN NEBU
INHALATION_SOLUTION | RESPIRATORY_TRACT | Status: AC
  Filled 2013-08-26: qty 3

## 2013-08-26 MED ORDER — ROCURONIUM BROMIDE 100 MG/10ML IV SOLN
INTRAVENOUS | Status: DC | PRN
  Administered 2013-08-26: 30 mg via INTRAVENOUS
  Administered 2013-08-26: 5 mg via INTRAVENOUS

## 2013-08-26 SURGICAL SUPPLY — 55 items
BAG DECANTER FOR FLEXI CONT (MISCELLANEOUS) ×4 IMPLANT
BAG HAMPER (MISCELLANEOUS) ×4 IMPLANT
BANDAGE STRIP 1X3 FLEXIBLE (GAUZE/BANDAGES/DRESSINGS) ×12 IMPLANT
BLADE 11 SAFETY STRL DISP (BLADE) ×4 IMPLANT
BLADE 15 SAFETY STRL DISP (BLADE) ×8 IMPLANT
BLADE SURG SZ11 CARB STEEL (BLADE) ×4 IMPLANT
CATH ROBINSON RED A/P 16FR (CATHETERS) ×8 IMPLANT
CATH THERMACHOICE III (CATHETERS) ×4 IMPLANT
CLOTH BEACON ORANGE TIMEOUT ST (SAFETY) ×4 IMPLANT
COVER LIGHT HANDLE STERIS (MISCELLANEOUS) ×8 IMPLANT
COVER MAYO STAND XLG (DRAPE) ×4 IMPLANT
DECANTER SPIKE VIAL GLASS SM (MISCELLANEOUS) ×4 IMPLANT
DURAPREP 26ML APPLICATOR (WOUND CARE) ×4 IMPLANT
ELECT REM PT RETURN 9FT ADLT (ELECTROSURGICAL) ×4
ELECTRODE REM PT RTRN 9FT ADLT (ELECTROSURGICAL) ×3 IMPLANT
FORMALIN 10 PREFIL 120ML (MISCELLANEOUS) ×4 IMPLANT
GLOVE BIO SURGEON STRL SZ7.5 (GLOVE) ×4 IMPLANT
GLOVE BIOGEL PI IND STRL 7.0 (GLOVE) ×3 IMPLANT
GLOVE BIOGEL PI IND STRL 9 (GLOVE) ×6 IMPLANT
GLOVE BIOGEL PI INDICATOR 7.0 (GLOVE) ×1
GLOVE BIOGEL PI INDICATOR 9 (GLOVE) ×2
GLOVE ECLIPSE 6.5 STRL STRAW (GLOVE) ×4 IMPLANT
GLOVE ECLIPSE 7.5 STRL STRAW (GLOVE) ×4 IMPLANT
GLOVE ECLIPSE 9.0 STRL (GLOVE) ×4 IMPLANT
GOWN SPEC L3 XXLG W/TWL (GOWN DISPOSABLE) ×4 IMPLANT
GOWN STRL REUS W/TWL LRG LVL3 (GOWN DISPOSABLE) ×4 IMPLANT
INST SET HYSTEROSCOPY (KITS) ×4 IMPLANT
INST SET LAPROSCOPIC GYN AP (KITS) ×4 IMPLANT
IV D5W 500ML (IV SOLUTION) ×4 IMPLANT
IV NS 1000ML (IV SOLUTION) ×1
IV NS 1000ML BAXH (IV SOLUTION) ×3 IMPLANT
KIT ROOM TURNOVER AP CYSTO (KITS) ×4 IMPLANT
KIT ROOM TURNOVER APOR (KITS) ×4 IMPLANT
MANIFOLD NEPTUNE II (INSTRUMENTS) ×4 IMPLANT
NEEDLE HYPO 25X1 1.5 SAFETY (NEEDLE) ×4 IMPLANT
NEEDLE INSUFFLATION 120MM (ENDOMECHANICALS) ×4 IMPLANT
NS IRRIG 1000ML POUR BTL (IV SOLUTION) ×4 IMPLANT
PACK PERI GYN (CUSTOM PROCEDURE TRAY) ×4 IMPLANT
PAD ARMBOARD 7.5X6 YLW CONV (MISCELLANEOUS) ×4 IMPLANT
PAD TELFA 3X4 1S STER (GAUZE/BANDAGES/DRESSINGS) ×4 IMPLANT
SCALPEL HARMONIC ACE (MISCELLANEOUS) ×4 IMPLANT
SET BASIN LINEN APH (SET/KITS/TRAYS/PACK) ×4 IMPLANT
SET IRRIG Y TYPE TUR BLADDER L (SET/KITS/TRAYS/PACK) ×4 IMPLANT
SOLUTION ANTI FOG 6CC (MISCELLANEOUS) ×4 IMPLANT
STRIP CLOSURE SKIN 1/2X4 (GAUZE/BANDAGES/DRESSINGS) ×4 IMPLANT
SUT VIC AB 4-0 PS2 27 (SUTURE) ×8 IMPLANT
SUT VICRYL 0 UR6 27IN ABS (SUTURE) ×4 IMPLANT
SYR BULB IRRIGATION 50ML (SYRINGE) ×4 IMPLANT
SYR CONTROL 10ML LL (SYRINGE) ×4 IMPLANT
SYRINGE 10CC LL (SYRINGE) ×4 IMPLANT
TROCAR ENDO BLADELESS 11MM (ENDOMECHANICALS) ×8 IMPLANT
TROCAR XCEL NON-BLD 5MMX100MML (ENDOMECHANICALS) ×4 IMPLANT
TUBING INSUFFLATION (TUBING) ×4 IMPLANT
WARMER LAPAROSCOPE (MISCELLANEOUS) ×4 IMPLANT
WATER STERILE IRR 1000ML POUR (IV SOLUTION) ×4 IMPLANT

## 2013-08-26 NOTE — Discharge Instructions (Signed)
Endometrial Ablation Endometrial ablation removes the lining of the uterus (endometrium). It is usually a same-day, outpatient treatment. Ablation helps avoid major surgery, such as surgery to remove the cervix and uterus (hysterectomy). After endometrial ablation, you will have little or no menstrual bleeding and may not be able to have children. However, if you are premenopausal, you will need to use a reliable method of birth control following the procedure because of the small chance that pregnancy can occur. There are different reasons to have this procedure, which include:  Heavy periods.  Bleeding that is causing anemia.  Irregular bleeding.  Bleeding fibroids on the lining inside the uterus if they are smaller than 3 centimeters. This procedure should not be done if:  You want children in the future.  You have severe cramps with your menstrual period.  You have precancerous or cancerous cells in your uterus.  You were recently pregnant.  You have gone through menopause.  You have had major surgery on the uterus, such as a cesarean delivery. LET YOUR HEALTH CARE PROVIDER KNOW ABOUT:  Any allergies you have.  All medicines you are taking, including vitamins, herbs, eye drops, creams, and over-the-counter medicines.  Previous problems you or members of your family have had with the use of anesthetics.  Any blood disorders you have.  Previous surgeries you have had.  Medical conditions you have. RISKS AND COMPLICATIONS  Generally, this is a safe procedure. However, as with any procedure, complications can occur. Possible complications include:  Perforation of the uterus.  Bleeding.  Infection of the uterus, bladder, or vagina.  Injury to surrounding organs.  An air bubble to the lung (air embolus).  Pregnancy following the procedure.  Failure of the procedure to help the problem, requiring hysterectomy.  Decreased ability to diagnose cancer in the lining of  the uterus. BEFORE THE PROCEDURE  The lining of the uterus must be tested to make sure there is no pre-cancerous or cancer cells present.  An ultrasound may be performed to look at the size of the uterus and to check for abnormalities.  Medicines may be given to thin the lining of the uterus. PROCEDURE  During the procedure, your health care provider will use a tool called a resectoscope to help see inside your uterus. There are different ways to remove the lining of your uterus.   Radiofrequency  This method uses a radiofrequency-alternating electric current to remove the lining of the uterus.  Cryotherapy This method uses extreme cold to freeze the lining of the uterus.  Heated-Free Liquid  This method uses heated salt (saline) solution to remove the lining of the uterus.  Microwave This method uses high-energy microwaves to heat up the lining of the uterus to remove it.  Thermal balloon  This method involves inserting a catheter with a balloon tip into the uterus. The balloon tip is filled with heated fluid to remove the lining of the uterus. AFTER THE PROCEDURE  After your procedure, do not have sexual intercourse or insert anything into your vagina until permitted by your health care provider. After the procedure, you may experience:  Cramps.  Vaginal discharge.  Frequent urination. Document Released: 02/25/2004 Document Revised: 12/18/2012 Document Reviewed: 09/18/2012 Exit[employer] Patient Information 2014 [employer].  

## 2013-08-26 NOTE — Brief Op Note (Signed)
08/26/2013  2:41 PM  PATIENT:  Rebecca Boyd  38 y.o. female  PRE-OPERATIVE DIAGNOSIS:  sterilization, reduction of menses; symptomatic vulvar sebaceous cyst sebaceous cyst  POST-OPERATIVE DIAGNOSIS:  sterilization, reduction of menses; symptomatic vulvar sebaceous cyst; left peritubal cyst; pelvic adhesions, right hydrosalpinx  PROCEDURE:  Procedure(s): LAPAROSCOPIC BILATERAL SALPINGECTOMY AND REMOVAL OF LEFT PERITUBAL CYST Procedure #1 (Bilateral) DILATATION & CURETTAGE/HYSTEROSCOPY WITH THERMACHOICE ENDOMETRIAL ABLATION Procedure #2 Total Therapy Time=min       sec REMOVAL OF VULVAR SEBACEOUS CYST Procedure #3 LAPAROSCOPIC LYSIS OF ADHESIONS Procedure #1  SURGEON:  Surgeon(s) and Role:    * Jonnie Kind, MD - Primary  PHYSICIAN ASSISTANT:   ASSISTANTS: [place] CST-FA   ANESTHESIA:   local and general  EBL:  Total I/O In: 1000 [I.V.:1000] Out: 200 [Urine:200]  BLOOD ADMINISTERED:none  DRAINS: none   LOCAL MEDICATIONS USED:  MARCAINE    and Amount: 30 ml  SPECIMEN:  Source of Specimen:  Bilateral fallopian tubes, endometrial curettings  DISPOSITION OF SPECIMEN:  PATHOLOGY  COUNTS:  YES  TOURNIQUET:  * No tourniquets in log *  DICTATION: .Dragon Dictation  PLAN OF CARE: Discharge to home after PACU  PATIENT DISPOSITION:  PACU - hemodynamically stable.   Delay start of Pharmacological VTE agent (>24hrs) due to surgical blood loss or risk of bleeding: not applicable

## 2013-08-26 NOTE — Op Note (Signed)
  2:41 PM  PATIENT:  Rebecca Boyd  38 y.o. female  PRE-OPERATIVE DIAGNOSIS:  sterilization, reduction of menses; symptomatic vulvar sebaceous cyst sebaceous cyst  POST-OPERATIVE DIAGNOSIS:  sterilization, reduction of menses; symptomatic vulvar sebaceous cyst; left peritubal cyst; pelvic adhesions, right hydrosalpinx  PROCEDURE:  Procedure(s): LAPAROSCOPIC BILATERAL SALPINGECTOMY AND REMOVAL OF LEFT PERITUBAL CYST Procedure #1 (Bilateral) DILATATION & CURETTAGE/HYSTEROSCOPY WITH THERMACHOICE ENDOMETRIAL ABLATION Procedure #2 Total Therapy Time=min       sec REMOVAL OF VULVAR SEBACEOUS CYST Procedure #3 LAPAROSCOPIC LYSIS OF ADHESIONS Procedure #1  SURGEON:  Surgeon(s) and Role:    * Jonnie Kind, MD - Primary  PHYSICIAN ASSISTANT:   ASSISTANTS: [place] CST-FA   ANESTHESIA:   local and general  EBL:  Total I/O In: 1000 [I.V.:1000] Out: 200 [Urine:200]  Details of procedure: Patient was taken to the operating room prepped and draped for laparoscopic procedure with Hulka tenaculum attached the cervix for uterine manipulation after timeout was conducted. Ancef administered. Incisions were made at the umbilicus suprapubic and right lower quadrant, 1 cm in length. The umbilical site was used for insertion of the Veress needle pneumoperitoneum achieved under 7 mm mercury pressure after water droplet test used. The laps covered trocar was introduced without difficulty and attention the pelvis identified then filmy adhesions regarding the left tube, right hydrosalpinx and multiple small fibroids in the myometrium patient was then directed to the right fallopian tube which could be excised using the harmonic scalpel through the suprapubic site. Specimen was too dilated to extracted through the 5 mm port so was was placed in the vesicouterine reflection of peritoneum for future grasp., Location.. After of both fallopian tubes were excised, the smaller left one couldn't be removed through  the right lower quadrant trocar site, and then the 5 mm camera was used through the right lower quadrant trocar and the larger specimen from the right tube removed through the umbilical port. One 20 cc of saline was instilled in the abdomen and the abdomen deflated, scopic removed, the fascia closed with 0 Vicryl and subcuticular 4-0 Vicryl skin closure completed the procedure.  The endometrial ablation then followed. Speculum was inserted cervix grasped with single-tooth tenaculum sounded to 8.5 cm in the anteflexed position, dilated with some difficulty to 23 [place] allowing slow introduction of the 30 rigid hysteroscope which identified both tubal ostia and minimal amount of endometrial tissue. Small curetting was performed and the small specimens were sent for histology. Gynecare ThermaChoice 3 endometrial ablation device was then activated prepared inserted 8.5 cm and filled with 13 cc of D5W and the 8 minute thermal ablation sequence completed with recovery of all fluid. The paracervical block with 20 cc of Marcaine solution then followed. Cyst removal. The left labial sebaceous cyst was then excised making a 1 cm incision over its surface, and placing the cyst on traction and excising it sharply. A single stitch of 4-0 Vicryl was used to pull the skin edges together. Sponge and needle counts were correct throughout patient recovery room in stable condition end of dictation patient will go home today

## 2013-08-26 NOTE — Anesthesia Procedure Notes (Signed)
Procedure Name: Intubation Date/Time: 08/26/2013 1:13 PM Performed by: Tressie Stalker E Pre-anesthesia Checklist: Patient identified, Patient being monitored, Timeout performed, Emergency Drugs available and Suction available Patient Re-evaluated:Patient Re-evaluated prior to inductionOxygen Delivery Method: Circle System Utilized Preoxygenation: Pre-oxygenation with 100% oxygen Intubation Type: IV induction Ventilation: Mask ventilation without difficulty Laryngoscope Size: Mac and 3 Grade View: Grade I Tube type: Oral Tube size: 7.0 mm Number of attempts: 1 Airway Equipment and Method: stylet Placement Confirmation: ETT inserted through vocal cords under direct vision,  positive ETCO2 and breath sounds checked- equal and bilateral Secured at: 20 cm Tube secured with: Tape Dental Injury: Teeth and Oropharynx as per pre-operative assessment

## 2013-08-26 NOTE — Interval H&P Note (Signed)
History and Physical Interval Note:  08/26/2013 12:54 PM  Rebecca Boyd  has presented today for surgery, with the diagnosis of sterilization, reduction of menses; symptomatic vulvar sebaceous cyst sebaceous cyst  The various methods of treatment have been discussed with the patient and family. After consideration of risks, benefits and other options for treatment, the patient has consented to  Procedure(s): LAPAROSCOPIC BILATERAL SALPINGECTOMY (Bilateral) DILATATION & CURETTAGE/HYSTEROSCOPY WITH THERMACHOICE ENDOMETRIAL ABLATION REMOVAL OF VULVAR SEBACEOUS CYST as a surgical intervention .  The patient's history has been reviewed, patient examined, no change in status, stable for surgery.  I have reviewed the patient's chart and labs.  Questions were answered to the patient's satisfaction.     Jonnie Kind

## 2013-08-26 NOTE — Anesthesia Postprocedure Evaluation (Signed)
  Anesthesia Post-op Note  Patient: Rebecca Boyd  Procedure(s) Performed: Procedure(s): LAPAROSCOPIC BILATERAL SALPINGECTOMY AND REMOVAL OF LEFT PERITUBAL CYST Procedure #1 (Bilateral) DILATATION & CURETTAGE/HYSTEROSCOPY WITH THERMACHOICE ENDOMETRIAL ABLATION Procedure #2 Total Therapy Time=min       sec REMOVAL OF VULVAR SEBACEOUS CYST Procedure #3 LAPAROSCOPIC LYSIS OF ADHESIONS Procedure #1  Patient Location: PACU  Anesthesia Type:General  Level of Consciousness: awake, alert  and oriented  Airway and Oxygen Therapy: Patient Spontanous Breathing  Post-op Pain: mild  Post-op Assessment: Post-op Vital signs reviewed, Patient's Cardiovascular Status Stable, Respiratory Function Stable, Patent Airway and No signs of Nausea or vomiting  Post-op Vital Signs: Reviewed and stable  Last Vitals:  Filed Vitals:   08/26/13 1145  BP: 135/86  Pulse:   Temp:   Resp:     Complications: No apparent anesthesia complications

## 2013-08-26 NOTE — Transfer of Care (Signed)
Immediate Anesthesia Transfer of Care Note  Patient: Rebecca Boyd  Procedure(s) Performed: Procedure(s): LAPAROSCOPIC BILATERAL SALPINGECTOMY AND REMOVAL OF LEFT PERITUBAL CYST Procedure #1 (Bilateral) DILATATION & CURETTAGE/HYSTEROSCOPY WITH THERMACHOICE ENDOMETRIAL ABLATION Procedure #2 Total Therapy Time=min       sec REMOVAL OF VULVAR SEBACEOUS CYST Procedure #3 LAPAROSCOPIC LYSIS OF ADHESIONS Procedure #1  Patient Location: PACU  Anesthesia Type:General  Level of Consciousness: awake, alert  and oriented  Airway & Oxygen Therapy: Patient Spontanous Breathing and Patient connected to face mask oxygen  Post-op Assessment: Report given to PACU RN  Post vital signs: Reviewed and stable  Complications: No apparent anesthesia complications

## 2013-08-26 NOTE — Anesthesia Preprocedure Evaluation (Signed)
Anesthesia Evaluation  Patient identified by MRN, date of birth, ID band Patient awake    Reviewed: Allergy & Precautions, H&P , NPO status , Patient's Chart, lab work & pertinent test results  Airway Mallampati: II TM Distance: >3 FB Neck ROM: Full    Dental  (+) Teeth Intact   Pulmonary asthma , Current Smoker,  breath sounds clear to auscultation        Cardiovascular negative cardio ROS  Rhythm:Regular Rate:Normal     Neuro/Psych  Headaches, PSYCHIATRIC DISORDERS Anxiety Depression    GI/Hepatic negative GI ROS,   Endo/Other    Renal/GU      Musculoskeletal   Abdominal   Peds  Hematology  (+) anemia ,   Anesthesia Other Findings   Reproductive/Obstetrics                           Anesthesia Physical Anesthesia Plan  ASA: II  Anesthesia Plan: General   Post-op Pain Management:    Induction: Intravenous  Airway Management Planned: Oral ETT  Additional Equipment:   Intra-op Plan:   Post-operative Plan: Extubation in OR  Informed Consent: I have reviewed the patients History and Physical, chart, labs and discussed the procedure including the risks, benefits and alternatives for the proposed anesthesia with the patient or authorized representative who has indicated his/her understanding and acceptance.     Plan Discussed with:   Anesthesia Plan Comments:         Anesthesia Quick Evaluation

## 2013-08-28 ENCOUNTER — Encounter (HOSPITAL_COMMUNITY): Payer: Self-pay | Admitting: Obstetrics and Gynecology

## 2013-08-28 ENCOUNTER — Inpatient Hospital Stay (HOSPITAL_COMMUNITY): Admission: RE | Admit: 2013-08-28 | Payer: 59 | Source: Ambulatory Visit | Admitting: Physical Therapy

## 2013-08-31 DIAGNOSIS — R509 Fever, unspecified: Secondary | ICD-10-CM | POA: Insufficient documentation

## 2013-08-31 DIAGNOSIS — R109 Unspecified abdominal pain: Secondary | ICD-10-CM | POA: Insufficient documentation

## 2013-08-31 NOTE — Assessment & Plan Note (Signed)
fevrr of unknown origin, may be related to pelvic infection needs ED eval based on symptom severity

## 2013-08-31 NOTE — Assessment & Plan Note (Signed)
Acute abdominal pain debilitating with fever, needs ED eval recently treated foSTD

## 2013-09-02 ENCOUNTER — Inpatient Hospital Stay (HOSPITAL_COMMUNITY): Admission: RE | Admit: 2013-09-02 | Payer: 59 | Source: Ambulatory Visit

## 2013-09-03 ENCOUNTER — Ambulatory Visit (INDEPENDENT_AMBULATORY_CARE_PROVIDER_SITE_OTHER): Payer: 59 | Admitting: Obstetrics and Gynecology

## 2013-09-03 ENCOUNTER — Encounter: Payer: Self-pay | Admitting: Obstetrics and Gynecology

## 2013-09-03 VITALS — BP 120/76 | Ht 60.0 in | Wt 151.0 lb

## 2013-09-03 DIAGNOSIS — Z9889 Other specified postprocedural states: Secondary | ICD-10-CM

## 2013-09-03 NOTE — Progress Notes (Signed)
This chart was scribed by Jenne Campus, Medical Scribe, for Dr. Mallory Shirk on 09/03/13 at 9:40 AM. This chart was reviewed by Dr. Mallory Shirk for accuracy.  Subjective:  Rebecca Boyd is a 38 y.o. female who presents to the clinic 1 weeks status post LAPAROSCOPIC BILATERAL SALPINGECTOMY (Bilateral)  DILATATION & CURETTAGE/HYSTEROSCOPY WITH THERMACHOICE ENDOMETRIAL ABLATION  REMOVAL OF VULVAR SEBACEOUS CYST   Review of Systems Negative except small umbilical rash that appears to be a reaction to surgical tape  She has been eating a regular diet without difficulty.   Bowel movements are normal. Pain is controlled with current analgesics. Medications being used: Norco. Toradol and Percocet   Objective:  BP 120/76  Ht 5' (1.524 m)  Wt 151 lb (68.493 kg)  BMI 29.49 kg/m2  LMP 08/13/2013 Chaperone present for exam which was performed with pt's permission General:Well developed, well nourished.  No acute distress. Abdomen: Bowel sounds normal, soft, non-tender. Pelvic Exam:    External Genitalia:  Normal. One stitch removed from the left labia minora without difficulty     Vagina: Normal, normal post ablation tissue debris that is non-purulent     Cervix: Normal  Incision(s):   Healing well, no drainage, no erythema, no hernia, no swelling, no dehiscence, incision well approximated.   Assessment:  Post-Op 1 weeks s/p LAPAROSCOPIC BILATERAL SALPINGECTOMY (Bilateral)  DILATATION & CURETTAGE/HYSTEROSCOPY WITH THERMACHOICE ENDOMETRIAL ABLATION  REMOVAL OF VULVAR SEBACEOUS CYST  Doing well postoperatively.   Plan:  1.Wound care discussed 2. .Continue any current medications. 3. Activity restrictions: none 4. return to work: now. 5. Follow up in 1 year

## 2013-09-04 ENCOUNTER — Ambulatory Visit (HOSPITAL_COMMUNITY): Payer: 59 | Admitting: Physical Therapy

## 2013-09-08 ENCOUNTER — Telehealth: Payer: Self-pay | Admitting: Obstetrics and Gynecology

## 2013-09-08 NOTE — Telephone Encounter (Signed)
Pt needed a more detailed letter with the dates on it per her supervisor. New letter written, and put up front for pt to [pick up. Pt aware.

## 2013-09-16 ENCOUNTER — Ambulatory Visit: Payer: 59 | Admitting: Family Medicine

## 2013-09-24 ENCOUNTER — Ambulatory Visit (HOSPITAL_COMMUNITY)
Admission: RE | Admit: 2013-09-24 | Discharge: 2013-09-24 | Disposition: A | Discharge status: Home / Self Care | Payer: 59 | Source: Ambulatory Visit | Attending: Family Medicine | Admitting: Family Medicine

## 2013-09-24 DIAGNOSIS — M545 Low back pain, unspecified: Secondary | ICD-10-CM | POA: Insufficient documentation

## 2013-09-24 DIAGNOSIS — M6281 Muscle weakness (generalized): Secondary | ICD-10-CM | POA: Insufficient documentation

## 2013-09-24 DIAGNOSIS — M542 Cervicalgia: Secondary | ICD-10-CM | POA: Insufficient documentation

## 2013-09-24 DIAGNOSIS — M546 Pain in thoracic spine: Secondary | ICD-10-CM | POA: Insufficient documentation

## 2013-09-24 DIAGNOSIS — M549 Dorsalgia, unspecified: Secondary | ICD-10-CM

## 2013-09-24 DIAGNOSIS — IMO0001 Reserved for inherently not codable concepts without codable children: Secondary | ICD-10-CM | POA: Insufficient documentation

## 2013-09-24 NOTE — Evaluation (Addendum)
Physical Therapy Re-Assessment  Patient Details  Name: Rebecca Boyd MRN: [id number] Date of Birth: 1976/03/29  Today's Date: 09/24/2013 Time: 0998-3382  Manual 1300-1325, TherEx 5053-9767 PT Time Calculation (min): 45 min              Visit#: 4 of 12  Re-eval: 10/24/13 Assessment Diagnosis: back pain  Next MD Visit: Dr. Moshe Cipro MD  appt in june 4th  Authorization: united     Authorization Time Period:    Authorization Visit#:  4 of  12   Past Medical History:  Past Medical History  Diagnosis Date  . Depressive disorder, not elsewhere classified   . Neck pain   . Vaginitis     cyctitis  . Miscarriage 2009  . Anemia   . Anxiety    Past Surgical History:  Past Surgical History  Procedure Laterality Date  . Cervical cryotherapy N/A 1999  . Laparoscopic bilateral salpingectomy Bilateral 08/26/2013    Procedure: LAPAROSCOPIC BILATERAL SALPINGECTOMY AND REMOVAL OF LEFT PERITUBAL CYST Procedure #1;  Surgeon: Jonnie Kind, MD;  Location: [facility];  Service: Gynecology;  Laterality: Bilateral;  . Dilitation & currettage/hystroscopy with thermachoice ablation  08/26/2013    Procedure: DILATATION & CURETTAGE/HYSTEROSCOPY WITH THERMACHOICE ENDOMETRIAL ABLATION Procedure #2 Total Therapy Time=min       sec;  Surgeon: Jonnie Kind, MD;  Location: [facility];  Service: Gynecology;;  . Vulvar lesion removal  08/26/2013    Procedure: REMOVAL OF VULVAR SEBACEOUS CYST Procedure #3;  Surgeon: Jonnie Kind, MD;  Location: [facility];  Service: Gynecology;;  . Laparoscopic lysis of adhesions  08/26/2013    Procedure: LAPAROSCOPIC LYSIS OF ADHESIONS Procedure #1;  Surgeon: Jonnie Kind, MD;  Location: [facility];  Service: Gynecology;;    Subjective Symptoms/Limitations Symptoms: Pt states her pain remains constant, reports 6/10 pain today in mid scapular region. and mid thoracic spine Pertinent History: injured back at work a couple of years ago with therapy, recalls having a ruptured disc  Patient recently had stomach surgery and was unable to perform therapy during that time while she recoved from surgery.  How long can you stand comfortably?: pain onset after 5 min standing and increasing as time increases with standing.  Patient Stated Goals: decrease pain  Pain Assessment Currently in Pain?: Yes Pain Score: 6  Pain Location: Scapula Pain Orientation: Mid Pain Type: Acute pain Pain Frequency: Intermittent  Precautions/Restrictions  Precautions Precautions: None  Assessment LUE AROM (degrees) Left Shoulder Flexion: 150 Degrees Left Shoulder ABduction: 140 Degrees Left Shoulder Horizontal ABduction: 30 Degrees LUE Strength Left Shoulder Flexion:  (4-/5) Left Shoulder ABduction:  (4-/5) Left Shoulder Internal Rotation:  (4-/5) Left Shoulder External Rotation:  (4-/5) Palpation Palpation: decrased PA mobility cervical and thoracic spine , spasm B upper and middle trapezius   Exercise/Treatments Stretches Cross Chest Stretch: 3 reps;20 seconds Other Shoulder Stretches: 3way pec stretch 3x 20seconds Other Shoulder Stretches: Latissimus stretch 3x 20seconds, 3D neck stretch 10x each way 3 second hold.  Manual Therapy Joint Mobilization: prone cervical and thoracic PA mobs grade 1-2 for pain , MFR thoraicic and upper cervical x 10 min Massage: STM prone to cervical and thoracic mm region to reduce spasms and overall tightness  Assessment: Patient was reassessed this session follwoign a 1 month abscence from physical therapy due to recent abdominal surgey,. Patient dieisplays conitnued pain along Lt medial scapula secondary to trigger points that appear to be duie to conitnued upper limited pectoral muscle mobility, limited serrtus anterior mobility,  and weakness in all scapular stabilized mm Plan continue manual techniques and progress scapular stabilization and strength. Next session focus on body mechanics education [facility] PT twice a week for 4 more weeks.   Goals PT Short Term Goals PT Short Term Goal 1: able to stand at work 20 min without increased pain  PT Short Term Goal 1 - Progress: Progressing toward goal PT Short Term Goal 2: report reduced pain and stiffness in the morning by 25%  PT Short Term Goal 2 - Progress: Progressing toward goal PT Long Term Goals PT Long Term Goal 1: able to stand at work 1 hour with max 2/10 pain  PT Long Term Goal 1 - Progress: Progressing toward goal PT Long Term Goal 2: demonstrate proper lifting and carry mechanics for reduce strain on back at work and injury prevention  PT Long Term Goal 2 - Progress: Progressing toward goal Long Term Goal 3: pain average level less than 2/10 for improved quality of life  Long Term Goal 3 Progress: Progressing toward goal Long Term Goal 4: drive 30 min with max 1-2/10  pain Long Term Goal 4 Progress: Progressing toward goal PT Long Term Goal 5: patient to place 5 pound items from waist height to above shoulder level 10x without increased pain  Long Term Goal 5 Progress: Progressing toward goal PT Long Term Goal 6: standing activity at work 1 hour with max 3/10 pain   Problem List Patient Active Problem List   Diagnosis Date Noted  . Abdominal pain 08/31/2013  . Fever, unspecified 08/31/2013  . Pain in thoracic spine 08/07/2013  . Back pain 08/07/2013  . Thoracic back pain 07/30/2013  . Lumbar back pain 07/30/2013  . Menorrhagia 06/12/2013  . Sebaceous cyst of labia 06/12/2013  . Bartholin's cyst 06/01/2013  . 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 Headache(784.0) 11/14/2012  . Asthma, mild intermittent 11/14/2012  . Routine general medical examination at a health care facility 09/28/2012  . Urogenital infection by trichomonas vaginalis 09/20/2012  . Depression 09/18/2012  . Vitamin D deficiency 10/27/2010  . Anxiety 10/26/2010  . IRON DEFICIENCY ANEMIA SECONDARY TO BLOOD LOSS 03/07/2010  . MENORRHAGIA 03/03/2010  . FATIGUE 08/11/2009  .  VAGINITIS&VULVOVAGINITIS DISEASES CLASS ELSW 02/12/2008    PT - End of Session Activity Tolerance: Patient tolerated treatment well General Behavior During Therapy: WFL for tasks assessed/performed PT Plan of Care PT Home Exercise Plan: 3D neck stretch. 3 way pec stretch, posterior shoulder stretch     Ciaira Natividad R Karly Pitter 09/24/2013, 7:08 PM  Physician Documentation Your signature is required to indicate approval of the treatment plan as stated above.  Please sign and either send electronically or make a copy of this report for your files and return this physician signed original.   Please mark one 1.__approve of plan  2. ___approve of plan with the following conditions.   ______________________________                                                          _____________________ Physician Signature  Date  

## 2013-09-30 ENCOUNTER — Inpatient Hospital Stay (HOSPITAL_COMMUNITY): Admission: RE | Admit: 2013-09-30 | Payer: 59 | Source: Ambulatory Visit

## 2013-10-02 ENCOUNTER — Encounter: Payer: Self-pay | Admitting: Family Medicine

## 2013-10-02 ENCOUNTER — Encounter (INDEPENDENT_AMBULATORY_CARE_PROVIDER_SITE_OTHER): Payer: Self-pay

## 2013-10-02 ENCOUNTER — Other Ambulatory Visit (HOSPITAL_COMMUNITY)
Admission: RE | Admit: 2013-10-02 | Discharge: 2013-10-02 | Disposition: A | Discharge status: Home / Self Care | Payer: 59 | Source: Ambulatory Visit | Attending: Family Medicine | Admitting: Family Medicine

## 2013-10-02 ENCOUNTER — Ambulatory Visit (INDEPENDENT_AMBULATORY_CARE_PROVIDER_SITE_OTHER): Payer: 59 | Admitting: Family Medicine

## 2013-10-02 VITALS — BP 120/78 | HR 86 | Resp 16 | Wt 149.8 lb

## 2013-10-02 DIAGNOSIS — F3289 Other specified depressive episodes: Secondary | ICD-10-CM

## 2013-10-02 DIAGNOSIS — N920 Excessive and frequent menstruation with regular cycle: Secondary | ICD-10-CM

## 2013-10-02 DIAGNOSIS — F329 Major depressive disorder, single episode, unspecified: Secondary | ICD-10-CM

## 2013-10-02 DIAGNOSIS — N760 Acute vaginitis: Secondary | ICD-10-CM | POA: Insufficient documentation

## 2013-10-02 DIAGNOSIS — N771 Vaginitis, vulvitis and vulvovaginitis in diseases classified elsewhere: Secondary | ICD-10-CM

## 2013-10-02 DIAGNOSIS — F32A Depression, unspecified: Secondary | ICD-10-CM

## 2013-10-02 DIAGNOSIS — Z113 Encounter for screening for infections with a predominantly sexual mode of transmission: Secondary | ICD-10-CM | POA: Insufficient documentation

## 2013-10-02 DIAGNOSIS — M546 Pain in thoracic spine: Secondary | ICD-10-CM

## 2013-10-02 DIAGNOSIS — Z79899 Other long term (current) drug therapy: Secondary | ICD-10-CM

## 2013-10-02 DIAGNOSIS — M5090 Cervical disc disorder, unspecified, unspecified cervical region: Secondary | ICD-10-CM

## 2013-10-02 HISTORY — DX: Cervical disc disorder, unspecified, unspecified cervical region: M50.90

## 2013-10-02 MED ORDER — KETOROLAC TROMETHAMINE 60 MG/2ML IM SOLN
60.0000 mg | Freq: Once | INTRAMUSCULAR | Status: AC
  Administered 2013-10-02: 60 mg via INTRAMUSCULAR

## 2013-10-02 NOTE — Patient Instructions (Addendum)
F/u in 2.5 month, call if you need me before  You will be contacted and treated when we get results of tests done today  You will be referred for MRI of neck and upper back (thoracic spine) due to ongoing debilitating pain, pls continue therapy  Urine drug screen today, when report is available if clear, then you will be contacted to review and sign pain contract,medication will be prescribed at that time as was discussed today  Please keep appt with psychiatry

## 2013-10-03 ENCOUNTER — Ambulatory Visit (HOSPITAL_COMMUNITY)
Admission: RE | Admit: 2013-10-03 | Discharge: 2013-10-03 | Disposition: A | Discharge status: Home / Self Care | Payer: 59 | Source: Ambulatory Visit | Attending: Family Medicine | Admitting: Family Medicine

## 2013-10-03 DIAGNOSIS — M545 Low back pain, unspecified: Secondary | ICD-10-CM | POA: Insufficient documentation

## 2013-10-03 DIAGNOSIS — M6281 Muscle weakness (generalized): Secondary | ICD-10-CM | POA: Insufficient documentation

## 2013-10-03 DIAGNOSIS — M546 Pain in thoracic spine: Secondary | ICD-10-CM | POA: Insufficient documentation

## 2013-10-03 DIAGNOSIS — M549 Dorsalgia, unspecified: Secondary | ICD-10-CM

## 2013-10-03 DIAGNOSIS — M542 Cervicalgia: Secondary | ICD-10-CM | POA: Insufficient documentation

## 2013-10-03 DIAGNOSIS — IMO0001 Reserved for inherently not codable concepts without codable children: Secondary | ICD-10-CM | POA: Insufficient documentation

## 2013-10-03 NOTE — Progress Notes (Signed)
Physical Therapy Treatment Patient Details  Name: Rebecca Boyd MRN: [id number] Date of Birth: 30-Jan-1976  Today's Date: 10/03/2013 Time: 5329-9242 PT Time Calculation (min): 26 min Charge: TE [id number], Manual 2297-9892  Visit#: 5 of 12  Re-eval: 10/24/13    Authorization: united   Authorization Time Period:    Authorization Visit#:   of     Subjective: Symptoms/Limitations Symptoms: Pt stated pain scale 6/10 thoracic/scapular region. Pain Assessment Currently in Pain?: Yes Pain Score: 6  Pain Location: Scapula Pain Orientation: Mid  Precautions/Restrictions  Precautions Precautions: None  Exercise/Treatments Standing Lifting: From floor;From 12";10 reps;5 reps;Weights Lifting Weights (lbs): cueing for proper lifting mechanics Seated Retraction: Both;5 reps Standing Extension: Both;10 reps;Theraband Theraband Level (Shoulder Extension): Level 3 (Green) Row: Both;10 reps;Theraband Theraband Level (Shoulder Row): Level 3 (Green) Retraction: Both;10 reps;Theraband Theraband Level (Shoulder Retraction): Level 3 (Green) Stretches Other Shoulder Stretches: 3way pec stretch 3x 20seconds Other Shoulder Stretches: Latissimus stretch 3x 20seconds, 3D cervical and thoracic excurstion 10x each  Manual Therapy Massage: Prone MFR and STM to cervical and scapular region to reduce tightness and rhomboid spasms   Physical Therapy Assessment and Plan PT Assessment and Plan Clinical Impression Statement: Progressed to proper lifting and carrying with min cueing for weight distrubtion. Continued scapular stabilization and strengthening exercises, began postural strengthening with therabands for resistance with min cueing for form and technique, Manual technqiues complete to improve scapular mobility and reduce tightness and spasms Lt rhomboid region. Pt stated pain reduced at end of session, pt encouraged to stay hydrated to reduce risk of headaches following manual. PT Plan:  continue manual techniques and progress scapular stabilization and strength. Continue with  body mechanics to improve form and reduce risk of injury with work.    Goals PT Short Term Goals PT Short Term Goal 1: able to stand at work 20 min without increased pain  PT Short Term Goal 1 - Progress: Progressing toward goal PT Short Term Goal 2: report reduced pain and stiffness in the morning by 25%  PT Short Term Goal 2 - Progress: Progressing toward goal PT Long Term Goals PT Long Term Goal 1: able to stand at work 1 hour with max 2/10 pain  PT Long Term Goal 2: demonstrate proper lifting and carry mechanics for reduce strain on back at work and injury prevention  PT Long Term Goal 2 - Progress: Progressing toward goal Long Term Goal 3: pain average level less than 2/10 for improved quality of life  Long Term Goal 4: drive 30 min with max 1-2/10  pain PT Long Term Goal 5: patient to place 5 pound items from waist height to above shoulder level 10x without increased pain  PT Long Term Goal 6: standing activity at work 1 hour with max 3/10 pain   Problem List Patient Active Problem List   Diagnosis Date Noted  . Cervical neck pain with evidence of disc disease 10/02/2013  . Abdominal pain 08/31/2013  . Fever, unspecified 08/31/2013  . Pain in thoracic spine 08/07/2013  . Back pain 08/07/2013  . Thoracic back pain 07/30/2013  . Lumbar back pain 07/30/2013  . Menorrhagia 06/12/2013  . Sebaceous cyst of labia 06/12/2013  . Bartholin's cyst 06/01/2013  . 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 Headache(784.0) 11/14/2012  . Asthma, mild intermittent 11/14/2012  . Routine general medical examination at a health care facility 09/28/2012  . Urogenital infection by trichomonas vaginalis 09/20/2012  . Depression 09/18/2012  . Vitamin D deficiency 10/27/2010  . Anxiety 10/26/2010  .  IRON DEFICIENCY ANEMIA SECONDARY TO BLOOD LOSS 03/07/2010  . MENORRHAGIA 03/03/2010  . FATIGUE 08/11/2009  .  VAGINITIS&VULVOVAGINITIS DISEASES CLASS ELSW 02/12/2008    PT - End of Session Activity Tolerance: Patient tolerated treatment well General Behavior During Therapy: [facility] for tasks assessed/performed  GP    Aldona Lento 10/03/2013, 5:55 PM

## 2013-10-04 LAB — PRESCRIPTION MONITORING PROFILE (15 PANEL)
AMPHETAMINE/METH: NEGATIVE ng/mL
BARBITURATE SCREEN, URINE: NEGATIVE ng/mL
Benzodiazepine Screen, Urine: NEGATIVE ng/mL
Buprenorphine, Urine: NEGATIVE ng/mL
CANNABINOID SCRN UR: NEGATIVE ng/mL
Carisoprodol, Urine: NEGATIVE ng/mL
Cocaine Metabolites: NEGATIVE ng/mL
Creatinine, Urine: 142.56 mg/dL (ref >20.0)
FENTANYL URINE: NEGATIVE ng/mL
Meperidine, Ur: NEGATIVE ng/mL
Methadone Screen, Urine: NEGATIVE ng/mL
Nitrites, Initial: NEGATIVE ug/mL
Opiate Screen, Urine: NEGATIVE ng/mL
Oxycodone Screen, Ur: NEGATIVE ng/mL
PROPOXYPHENE: NEGATIVE ng/mL
Tramadol Scrn, Ur: NEGATIVE ng/mL
ZOLPIDEM, URINE: NEGATIVE ng/mL
pH, Initial: 7 pH (ref 4.5–8.9)

## 2013-10-04 NOTE — Assessment & Plan Note (Signed)
Uncontrolled neck pin , no response to pT, re imaging needed

## 2013-10-04 NOTE — Assessment & Plan Note (Signed)
Stable, no recent flare 

## 2013-10-04 NOTE — Assessment & Plan Note (Addendum)
unchanged and ongoing despite therapy will refer for imaging , reports pain as dibilitating and constant and jepeordizing her ability to work, toradol in office  And imaging study Requests percocet for pain, waiting on report of UDS and also imaging study

## 2013-10-04 NOTE — Progress Notes (Signed)
   Subjective:    Patient ID: Rebecca Boyd, female    DOB: November 29, 1975, 38 y.o.   MRN: [id number]  HPI Pt in with main c/o uncontrolled and increased neck and upper back pain , not responding to PT. States at the end of her workday she is  In agony, recently had percocet following endometrial ablation and requests this as an alternative pain med , also clearly needs updated imaging based on report severity  C/o malodorous vaginal d/c in the past week, want testing done Hd succesful ablation recently, no cycle since Has rescheduled psych appt and intends to keep this, reports being in surgery at the time, depression is unchanged, not suicidal or homicidal    Review of Systems See HPI Denies recent fever or chills. Denies sinus pressure, nasal congestion, ear pain or sore throat. Denies chest congestion, productive cough or wheezing. Denies chest pains, palpitations and leg swelling Denies abdominal pain, nausea, vomiting,diarrhea or constipation.   Denies dysuria, frequency, hesitancy or incontinence.  Denies headaches, seizures, numbness, or tingling.  Denies skin break down or rash.        Objective:   Physical Exam BP 120/78  Pulse 86  Resp 16  Wt 149 lb 12.8 oz (67.949 kg)  SpO2 100% Patient alert and oriented and in no cardiopulmonary distress.  HEENT: No facial asymmetry, EOMI, no sinus tenderness,  oropharynx pink and moist.  Neck decreased ROM, no adenopathy.  Chest: Clear to auscultation bilaterally.  CVS: S1, S2 no murmurs, no S3.  ABD: Soft non tender. Bowel sounds normal. Pelvic: uterus normal sized, cervix firm. White malodorous vaginal d/c , no cervical motion or adnexal tenderness Ext: No edema  MS: decreased  ROM thoracic  Spine, adequate in  shoulders, hips and knees.  Skin: Intact, no ulcerations or rash noted.  Psych: Good eye contact, normal affect. Memory intact not anxious , mildly  depressed appearing.  CNS: CN 2-12 intact, power, tone  and sensation normal throughout.        Assessment & Plan:  Depression Unchnaged, not suicidal or homicidal, has upcoming appt with psych which she intends to keep  Thoracic back pain unchanged and ongoing despite therapy will refer for imaging , reports pain as dibilitating and constant and jepeordizing her ability to work, toradol in office  And imaging study Requests percocet for pain, waiting on report of UDS and also imaging study  VAGINITIS&VULVOVAGINITIS DISEASES CLASS ELSW Recurrent malodorous d/c , recent h/o trich, specimens re sent for testing  Cervical neck pain with evidence of disc disease Uncontrolled neck pin , no response to pT, re imaging needed  Asthma, mild intermittent Stable , no recent flare

## 2013-10-04 NOTE — Assessment & Plan Note (Signed)
Recurrent malodorous d/c , recent h/o trich, specimens re sent for testing

## 2013-10-04 NOTE — Assessment & Plan Note (Signed)
Unchnaged, not suicidal or homicidal, has upcoming appt with psych which she intends to keep

## 2013-10-06 ENCOUNTER — Ambulatory Visit (HOSPITAL_COMMUNITY): Payer: 59 | Admitting: Physical Therapy

## 2013-10-06 ENCOUNTER — Other Ambulatory Visit: Payer: Self-pay | Admitting: Family Medicine

## 2013-10-06 ENCOUNTER — Telehealth: Payer: Self-pay | Admitting: Family Medicine

## 2013-10-06 ENCOUNTER — Other Ambulatory Visit: Payer: Self-pay

## 2013-10-06 DIAGNOSIS — M546 Pain in thoracic spine: Secondary | ICD-10-CM

## 2013-10-06 DIAGNOSIS — M5090 Cervical disc disorder, unspecified, unspecified cervical region: Secondary | ICD-10-CM

## 2013-10-06 MED ORDER — OXYCODONE-ACETAMINOPHEN 5-325 MG PO TABS: ORAL_TABLET | ORAL | Status: DC

## 2013-10-06 MED ORDER — METRONIDAZOLE 0.75 % VA GEL: 1.0000 | Freq: Two times a day (BID) | VAGINAL | Status: DC

## 2013-10-06 NOTE — Telephone Encounter (Signed)
Noted and printed for signature

## 2013-10-06 NOTE — Telephone Encounter (Signed)
pls write work excuse I will sign

## 2013-10-07 ENCOUNTER — Ambulatory Visit (HOSPITAL_COMMUNITY)
Admission: RE | Admit: 2013-10-07 | Discharge: 2013-10-07 | Disposition: A | Discharge status: Home / Self Care | Payer: 59 | Source: Ambulatory Visit | Attending: Family Medicine | Admitting: Family Medicine

## 2013-10-07 DIAGNOSIS — F329 Major depressive disorder, single episode, unspecified: Secondary | ICD-10-CM

## 2013-10-07 DIAGNOSIS — N920 Excessive and frequent menstruation with regular cycle: Secondary | ICD-10-CM

## 2013-10-07 DIAGNOSIS — F32A Depression, unspecified: Secondary | ICD-10-CM

## 2013-10-07 DIAGNOSIS — M5090 Cervical disc disorder, unspecified, unspecified cervical region: Secondary | ICD-10-CM

## 2013-10-07 DIAGNOSIS — R209 Unspecified disturbances of skin sensation: Secondary | ICD-10-CM | POA: Insufficient documentation

## 2013-10-07 DIAGNOSIS — M542 Cervicalgia: Secondary | ICD-10-CM | POA: Insufficient documentation

## 2013-10-07 DIAGNOSIS — N771 Vaginitis, vulvitis and vulvovaginitis in diseases classified elsewhere: Secondary | ICD-10-CM

## 2013-10-07 DIAGNOSIS — N760 Acute vaginitis: Secondary | ICD-10-CM

## 2013-10-07 DIAGNOSIS — Z79899 Other long term (current) drug therapy: Secondary | ICD-10-CM

## 2013-10-07 DIAGNOSIS — M546 Pain in thoracic spine: Secondary | ICD-10-CM

## 2013-10-07 DIAGNOSIS — M5380 Other specified dorsopathies, site unspecified: Secondary | ICD-10-CM | POA: Insufficient documentation

## 2013-10-08 ENCOUNTER — Ambulatory Visit (HOSPITAL_COMMUNITY): Payer: 59 | Admitting: Physical Therapy

## 2013-10-13 ENCOUNTER — Ambulatory Visit (HOSPITAL_COMMUNITY): Payer: 59 | Admitting: Physical Therapy

## 2013-10-15 ENCOUNTER — Ambulatory Visit (HOSPITAL_COMMUNITY): Payer: 59 | Admitting: Physical Therapy

## 2013-10-15 ENCOUNTER — Ambulatory Visit (HOSPITAL_COMMUNITY)
Admission: RE | Admit: 2013-10-15 | Discharge: 2013-10-15 | Disposition: A | Discharge status: Home / Self Care | Payer: 59 | Source: Ambulatory Visit | Attending: Family Medicine | Admitting: Family Medicine

## 2013-10-15 NOTE — Progress Notes (Signed)
Physical Therapy Treatment Patient Details  Name: Rebecca Boyd MRN: [id number] Date of Birth: [birth year]  Today's Date: 10/15/2013 Time: 1150-1220 PT Time Calculation (min): 30 min Visit#: 6 of 12  Re-eval: 10/24/13 Authorization: united   Charges:  therex 1150-1210 (20'), manual 1210-1220 (10')   Subjective: Symptoms/Limitations Symptoms: Pt states her pain remains 6/10 in Lt thoracic region.  Pt states her MRI shows arthritis and bulging disc at C5-C6.  Pain Assessment Currently in Pain?: Yes Pain Score: 6  Pain Location: Scapula Pain Orientation: Left   Exercise/Treatments Stretches Upper Trapezius Stretch: 2 reps;30 seconds Levator Stretch: 2 reps;30 seconds Corner Stretch: 3 reps;30 seconds Machines for Strengthening UBE (Upper Arm Bike): 4 minutes backward level 1 Theraband Exercises Scapula Retraction: 10 reps;Green Shoulder Extension: 10 reps;Green Rows: 10 reps;Green Seated Exercises Other Seated Exercise: thoracic 3D excursions 10 reps   Manual Therapy Manual Therapy: Massage Massage: seated STM to cervical and scapular region to reduce tightness and pain.  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  Discussed trial of traction with evaluating therapist and OK to try if pain continues to be high.  Spasm resolved in Lt upper trap today, no other spasms palpated or tightness throughout cervical and thoracic region.  Added streches with therapist facilitation for form.   PT Plan: continue manual techniques and progress scapular stabilization and strength. Continue with  body mechanics to improve form and reduce risk of injury with work.   Trial of traction next visit (OK with evaluating therapist)  to help decrease symptoms and pain.     Problem List Patient Active Problem List   Diagnosis Date Noted  . Cervical neck pain with evidence of disc disease 10/02/2013   . Abdominal pain 08/31/2013  . Pain in thoracic spine 08/07/2013  . Back pain 08/07/2013  . Thoracic back pain 07/30/2013  . Lumbar back pain 07/30/2013  . Menorrhagia 06/12/2013  . Sebaceous cyst of labia 06/12/2013  . Bartholin's cyst 06/01/2013  . 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 Headache(784.0) 11/14/2012  . Asthma, mild intermittent 11/14/2012  . Routine general medical examination at a health care facility 09/28/2012  . Urogenital infection by trichomonas vaginalis 09/20/2012  . Depression 09/18/2012  . Vitamin D deficiency 10/27/2010  . Anxiety 10/26/2010  . IRON DEFICIENCY ANEMIA SECONDARY TO BLOOD LOSS 03/07/2010  . MENORRHAGIA 03/03/2010  . FATIGUE 08/11/2009  . VAGINITIS&VULVOVAGINITIS DISEASES CLASS ELSW 02/12/2008    PT - End of Session Activity Tolerance: Patient tolerated treatment well General Behavior During Therapy: WFL for tasks assessed/performed   Teena Irani, PTA/CLT 10/15/2013, 12:27 PM

## 2013-10-16 ENCOUNTER — Ambulatory Visit: Payer: 59 | Admitting: Family Medicine

## 2013-10-17 ENCOUNTER — Telehealth: Payer: Self-pay | Admitting: Family Medicine

## 2013-10-17 DIAGNOSIS — M546 Pain in thoracic spine: Secondary | ICD-10-CM

## 2013-10-17 NOTE — Telephone Encounter (Signed)
Patient states that she would like to try epidural for back pain.  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  Will send information in procedure.    Referral entered

## 2013-10-17 NOTE — Telephone Encounter (Signed)
Called and left message for patient to return call.  

## 2013-10-20 ENCOUNTER — Ambulatory Visit (HOSPITAL_COMMUNITY): Payer: 59

## 2013-10-21 ENCOUNTER — Other Ambulatory Visit: Payer: Self-pay | Admitting: Family Medicine

## 2013-10-21 DIAGNOSIS — R200 Anesthesia of skin: Secondary | ICD-10-CM

## 2013-10-21 DIAGNOSIS — M542 Cervicalgia: Secondary | ICD-10-CM

## 2013-10-21 NOTE — Addendum Note (Signed)
Addended by: Denman George B on: 10/21/2013 04:08 PM   Modules accepted: Orders

## 2013-10-21 NOTE — Addendum Note (Signed)
Addended by: Denman George B on: 10/21/2013 08:54 AM   Modules accepted: Orders

## 2013-10-22 ENCOUNTER — Ambulatory Visit (HOSPITAL_COMMUNITY): Payer: 59 | Admitting: Physical Therapy

## 2013-10-23 ENCOUNTER — Encounter (HOSPITAL_COMMUNITY): Payer: Self-pay | Admitting: Psychiatry

## 2013-10-23 ENCOUNTER — Ambulatory Visit (INDEPENDENT_AMBULATORY_CARE_PROVIDER_SITE_OTHER): Payer: 59 | Admitting: Psychiatry

## 2013-10-23 DIAGNOSIS — F39 Unspecified mood [affective] disorder: Secondary | ICD-10-CM

## 2013-10-23 NOTE — Progress Notes (Signed)
Patient:   BRYCE KIMBLE   DOB:   [birth year]  MR Number:  [id number]  Location:  [address], [place], [place] [address]  Date of Service:   Thursday 10/23/2013  Start Time:   10:05 AM End Time:   10:55 AM  Provider/Observer:  Maurice Small, MSW, LCSW   Billing Code/Service:  [phone]  Chief Complaint:     Chief Complaint  Patient presents with  . Stress  . Other    irritability and moodl swings    Reason for Service:  The patient is referred for services by PCP Dr. Tula Nakayama due to patient experiencing mood disturbances. Patient states her doctor thinks she may have bipolar disorder. Patient reports being angry and irritable.and states little things bother her nerves "real quick". She reports times of being "real down" and crying for an hour or two. She also reports periods of being very argumentative, talking loud, and talking junk. She sometimes spends excessively and doesn't have the money to pay her bills. She reports work stress as she states co-workers get on her nerves easily. She has had verbal conflict and exchanges with some of her co-workers. She states sometimes becoming so angry she wants to choke the other person but being able to walk away. Patient reports only sleeping about  3-4 hours per night and experiencing constant fatigue. She attributes this partially to swing shift at work. Patient reports a long time pattern of worrying and states worrying about various issues including her job and finances. Paint also reports chronic back pain due to degenerative disc disease and arthritis.  Current Status:  Patient reports mood swings, irritability, anger, isoaltive behaviors, sleep difficulty ( only 3-4 hours of sleep per night), low energy, excessive worry, and being easily agitated.  Reliability of Information: Information gathered from patient.  Behavioral Observation: ELNA RADOVICH  presents as a 38 y.o.-year-old Right-handed African American Female who appeared  her stated age.  She was fairly groomed  and her manners were appropriate to the situation.  There were not any physical disabilities noted.  She displayed an appropriate level of cooperation and motivation.    Interactions:    Active   Attention:   normal  Memory:   normal  Visuo-spatial:   not examined  Speech (Volume):  normal  Speech:   normal pitch and normal volume  Thought Process:  Coherent and Relevant  Though Content:  Patient reports visual hallucinations (shadows, figures) and auditory hallucinations ( hears chatter but can't determine content), reports no command hallucinations.  Orientation:   person, place, situation, day of week, month of year and year  Judgment:   Fair  Planning:   Fair  Affect:    Angry  Mood:    Angry and Irritable  Insight:   Fair  Intelligence:   normal  Marital Status/Living: Patient was born and reared in [place]. Patient is second of five siblings. Parents were not married. She reports ordinary and good childhood. Patient and siblings were reared by aunt and saw mother frequently, Father was incarcerated when patient was a year old and released when patient was 38 years old. She reports good relationship with both parents now.  Patient and her mother reside in [place]. Father also resides inn [place]. She has a good relationship with siblings and all live nearby except for one who resides n [place]. Patient is single and  has no children.  Patient plays cards or goes to the sweepstakes for recreation.  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  Past Employment:  Glass blower/designer , fast food,   Substance Use:  Smokes a Black and Mild 2 times per week  Education:   Patient completed the 10th grade. Patient obtained GED and completed cosmetology course.  Medical History:   Past Medical History  Diagnosis Date  . Depressive disorder, not elsewhere  classified   . Neck pain   . Vaginitis     cyctitis  . Miscarriage 2009  . Anemia   . Anxiety     Sexual History:   History  Sexual Activity  . Sexual Activity: Yes  . Birth Control/ Protection: None    Abuse/Trauma History: Denies  Psychiatric History:  Patient has had no psychiatric hospitalizations and no outpatient psychotherapy. She reports being seen twice in this practice by psychiatrist Dr Adele Schilder. She has been taking xanax and prozac for 5 plus years as prescribed by PCP.  Family Med/Psych History:  Family History  Problem Relation Age of Onset  . Drug abuse Mother   . Hypertension Father   . Hypertension Maternal Grandmother   . Hypertension Maternal Grandfather   . Hypertension Paternal Grandmother     Risk of Suicide/Violence: Patient denies passing current suicidal and homicidal ideations. She reports no history of self injurious behaviors or violence.  Impression/DX:  The patient presents with a history of mood disturbances that have been present for several years per patient's report. Symptoms are worsening and include mood swings, irritability, anger, isoaltive behaviors, sleep difficulty ( only 3-4 hours of sleep per night), low energy, excessive worry, and being easily agitated. She also reports auditory and visual hallucinations but reports no command hallucinations. Diagnosis: Mood disorder NOS rule out bipolar disorder  Disposition/Plan:  The patient attends the assessment appointment today. Confidentiality and limits are discussed. The patient agrees to return for an appointment in 2 weeks for continuing assessment and treatment planning patient also agrees to see psychiatrist Dr. Harrington Challenger for medication evaluation. Patient agrees to call this practice, call 911, or have someone take her to the emergency room should symptoms worsen  Diagnosis:    Axis I:  Mood disorder      Axis II: Deferred       Axis III:   Past Medical History  Diagnosis Date  .  Depressive disorder, not elsewhere classified   . Neck pain   . Vaginitis     cyctitis  . Miscarriage 2009  . Anemia   . Anxiety         Axis IV:  occupational problems          Axis V:  51-60 moderate symptoms          Drey Shaff, LCSW

## 2013-10-23 NOTE — Patient Instructions (Signed)
Discussed orally 

## 2013-11-03 ENCOUNTER — Other Ambulatory Visit: Payer: Self-pay

## 2013-11-03 ENCOUNTER — Ambulatory Visit
Admission: RE | Admit: 2013-11-03 | Discharge: 2013-11-03 | Disposition: A | Discharge status: Home / Self Care | Payer: 59 | Source: Ambulatory Visit | Attending: Family Medicine | Admitting: Family Medicine

## 2013-11-03 DIAGNOSIS — M542 Cervicalgia: Secondary | ICD-10-CM

## 2013-11-03 DIAGNOSIS — R200 Anesthesia of skin: Secondary | ICD-10-CM

## 2013-11-03 MED ORDER — OXYCODONE-ACETAMINOPHEN 5-325 MG PO TABS: ORAL_TABLET | ORAL | Status: DC

## 2013-11-03 MED ORDER — TRIAMCINOLONE ACETONIDE 40 MG/ML IJ SUSP (RADIOLOGY)
60.0000 mg | Freq: Once | INTRAMUSCULAR | Status: AC
  Administered 2013-11-03: 60 mg via EPIDURAL

## 2013-11-03 MED ORDER — IOHEXOL 300 MG/ML  SOLN
1.0000 mL | Freq: Once | INTRAMUSCULAR | Status: AC | PRN
  Administered 2013-11-03: 1 mL via EPIDURAL

## 2013-11-03 NOTE — Discharge Instructions (Signed)

## 2013-11-11 ENCOUNTER — Encounter (HOSPITAL_COMMUNITY): Payer: Self-pay | Admitting: Emergency Medicine

## 2013-11-11 ENCOUNTER — Emergency Department (HOSPITAL_COMMUNITY)
Admission: EM | Admit: 2013-11-11 | Discharge: 2013-11-11 | Disposition: A | Discharge status: Home / Self Care | Payer: 59 | Attending: Emergency Medicine | Admitting: Emergency Medicine

## 2013-11-11 DIAGNOSIS — Z862 Personal history of diseases of the blood and blood-forming organs and certain disorders involving the immune mechanism: Secondary | ICD-10-CM | POA: Insufficient documentation

## 2013-11-11 DIAGNOSIS — F411 Generalized anxiety disorder: Secondary | ICD-10-CM | POA: Insufficient documentation

## 2013-11-11 DIAGNOSIS — F329 Major depressive disorder, single episode, unspecified: Secondary | ICD-10-CM | POA: Insufficient documentation

## 2013-11-11 DIAGNOSIS — Z8742 Personal history of other diseases of the female genital tract: Secondary | ICD-10-CM | POA: Insufficient documentation

## 2013-11-11 DIAGNOSIS — Z91040 Latex allergy status: Secondary | ICD-10-CM | POA: Insufficient documentation

## 2013-11-11 DIAGNOSIS — Z8679 Personal history of other diseases of the circulatory system: Secondary | ICD-10-CM | POA: Insufficient documentation

## 2013-11-11 DIAGNOSIS — G44209 Tension-type headache, unspecified, not intractable: Secondary | ICD-10-CM | POA: Insufficient documentation

## 2013-11-11 DIAGNOSIS — Z79899 Other long term (current) drug therapy: Secondary | ICD-10-CM | POA: Insufficient documentation

## 2013-11-11 DIAGNOSIS — F17200 Nicotine dependence, unspecified, uncomplicated: Secondary | ICD-10-CM | POA: Insufficient documentation

## 2013-11-11 DIAGNOSIS — F3289 Other specified depressive episodes: Secondary | ICD-10-CM | POA: Insufficient documentation

## 2013-11-11 HISTORY — DX: Migraine, unspecified, not intractable, without status migrainosus: G43.909

## 2013-11-11 MED ORDER — PROMETHAZINE HCL 25 MG/ML IJ SOLN
25.0000 mg | Freq: Once | INTRAMUSCULAR | Status: AC
  Administered 2013-11-11: 25 mg via INTRAVENOUS
  Filled 2013-11-11: qty 1

## 2013-11-11 MED ORDER — KETOROLAC TROMETHAMINE 30 MG/ML IJ SOLN
30.0000 mg | Freq: Once | INTRAMUSCULAR | Status: AC
  Administered 2013-11-11: 30 mg via INTRAVENOUS
  Filled 2013-11-11: qty 1

## 2013-11-11 NOTE — ED Provider Notes (Signed)
CSN: [id number]     Arrival date & time 11/11/13  1120 History  This chart was scribed for Rebecca Diego, MD,  by Stacy Gardner, ED Scribe. The patient was seen in room APA08/APA08 and the patient's care was started at 12:05 PM.   First MD Initiated Contact with Patient 11/11/13 1204     Chief Complaint  Patient presents with  . Migraine     (Consider location/radiation/quality/duration/timing/severity/associated sxs/prior Treatment) Patient is a 38 y.o. female presenting with migraines. The history is provided by the patient and medical records (the pt complains of a headache). No language interpreter was used.  Migraine This is a recurrent problem. The current episode started more than 1 week ago. The problem occurs every several days. The problem has not changed since onset.Associated symptoms include headaches. Pertinent negatives include no chest pain and no abdominal pain. Nothing aggravates the symptoms. Nothing relieves the symptoms.   HPI Comments: Rebecca Boyd is a 38 y.o. female w/ hx of reoccurring migraines who presents to the Emergency Department complaining of intermittent migraine for the past two weeks. She has the associated symptoms of nausea and photophobia.  She mentions when her migraines returns she usually goes to the ED for a "shot of something". Pt has allergies to Benadryl. Nothing seems to help. Past Medical History  Diagnosis Date  . Depressive disorder, not elsewhere classified   . Neck pain   . Vaginitis     cyctitis  . Miscarriage 2009  . Anemia   . Anxiety   . Migraine    Past Surgical History  Procedure Laterality Date  . Cervical cryotherapy N/A 1999  . Laparoscopic bilateral salpingectomy Bilateral 08/26/2013    Procedure: LAPAROSCOPIC BILATERAL SALPINGECTOMY AND REMOVAL OF LEFT PERITUBAL CYST Procedure #1;  Surgeon: Jonnie Kind, MD;  Location: [facility];  Service: Gynecology;  Laterality: Bilateral;  . Dilitation &  currettage/hystroscopy with thermachoice ablation  08/26/2013    Procedure: DILATATION & CURETTAGE/HYSTEROSCOPY WITH THERMACHOICE ENDOMETRIAL ABLATION Procedure #2 Total Therapy Time=min       sec;  Surgeon: Jonnie Kind, MD;  Location: [facility];  Service: Gynecology;;  . Vulvar lesion removal  08/26/2013    Procedure: REMOVAL OF VULVAR SEBACEOUS CYST Procedure #3;  Surgeon: Jonnie Kind, MD;  Location: [facility];  Service: Gynecology;;  . Laparoscopic lysis of adhesions  08/26/2013    Procedure: LAPAROSCOPIC LYSIS OF ADHESIONS Procedure #1;  Surgeon: Jonnie Kind, MD;  Location: [facility];  Service: Gynecology;;   Family History  Problem Relation Age of Onset  . Drug abuse Mother   . Hypertension Father   . Hypertension Maternal Grandmother   . Hypertension Maternal Grandfather   . Hypertension Paternal Grandmother    History  Substance Use Topics  . Smoking status: Light Tobacco Smoker -- 0.25 packs/day    Types: Cigarettes  . Smokeless tobacco: Never Used  . Alcohol Use: Yes     Comment: occasional   OB History   Grav Para Term Preterm Abortions TAB SAB Ect Mult Living                 Review of Systems  Constitutional: Negative for appetite change and fatigue.  HENT: Negative for congestion, ear discharge and sinus pressure.   Eyes: Positive for photophobia. Negative for discharge.  Respiratory: Negative for cough.   Cardiovascular: Negative for chest pain.  Gastrointestinal: Positive for nausea. Negative for vomiting, abdominal pain and diarrhea.  Genitourinary: Negative for frequency and  hematuria.  Musculoskeletal: Negative for back pain.  Skin: Negative for rash.  Neurological: Positive for headaches. Negative for seizures.  Psychiatric/Behavioral: Negative for hallucinations.  All other systems reviewed and are negative.     Allergies  Diphenhydramine hcl; Orange fruit; and Latex  Home Medications   Prior to Admission medications   Medication Sig Start Date  End Date Taking? Authorizing Provider  albuterol (PROVENTIL HFA;VENTOLIN HFA) 108 (90 BASE) MCG/ACT inhaler Inhale 2 puffs into the lungs every 6 (six) hours as needed for wheezing or shortness of breath.   Yes Historical Provider, MD  ALPRAZolam Duanne Moron) 1 MG tablet Take 1 mg by mouth at bedtime.   Yes Historical Provider, MD  cyclobenzaprine (FLEXERIL) 10 MG tablet Take 10 mg by mouth at bedtime as needed for muscle spasms. 07/30/13  Yes Fayrene Helper, MD  FLUoxetine (PROZAC) 20 MG capsule Take 1 capsule (20 mg total) by mouth daily. 05/29/13 05/29/14 Yes Fayrene Helper, MD  metroNIDAZOLE (METROGEL) 0.75 % gel Apply 1 application topically as needed.   Yes Historical Provider, MD  oxyCODONE-acetaminophen (PERCOCET) 5-325 MG per tablet One tablet at bedtime for back pain 11/03/13   Fayrene Helper, MD   BP 136/85  Pulse 86  Temp(Src) 98.2 F (36.8 C) (Oral)  Resp 18  Ht 5' (1.524 m)  Wt 145 lb (65.772 kg)  BMI 28.32 kg/m2  SpO2 99%  LMP 10/26/2013 Physical Exam  Constitutional: She is oriented to person, place, and time. She appears well-developed.  HENT:  Head: Normocephalic.  Eyes: Conjunctivae and EOM are normal. No scleral icterus.  Neck: Neck supple. No thyromegaly present.  Cardiovascular: Normal rate and regular rhythm.  Exam reveals no gallop and no friction rub.   No murmur heard. Pulmonary/Chest: No stridor. She has no wheezes. She has no rales. She exhibits no tenderness.  Abdominal: She exhibits no distension. There is no tenderness. There is no rebound.  Musculoskeletal: Normal range of motion. She exhibits no edema.  Lymphadenopathy:    She has no cervical adenopathy.  Neurological: She is oriented to person, place, and time. She exhibits normal muscle tone. Coordination normal.  Skin: No rash noted. No erythema.  Psychiatric: She has a normal mood and affect. Her behavior is normal.    ED Course  Procedures (including critical care time) DIAGNOSTIC  STUDIES: Oxygen Saturation is 99% on room air, normal by my interpretation.    COORDINATION OF CARE:  12:08 PM Discussed course of care with pt which includes Toradol. Pt understands and agrees.    Labs Review Labs Reviewed - No data to display  Imaging Review No results found.   EKG Interpretation None      MDM   Final diagnoses:  None    Pt improved   The chart was scribed for me under my direct supervision.  I personally performed the history, physical, and medical decision making and all procedures in the evaluation of this patient.Rebecca Diego, MD 11/11/13 [phone]

## 2013-11-11 NOTE — ED Notes (Signed)
Pt c/o intermittent migraine with nausea for the past two weeks, has hx of migraines.

## 2013-11-11 NOTE — Discharge Instructions (Signed)
Follow up as needed

## 2013-11-11 NOTE — ED Notes (Signed)
Pt states she is ready togo home. EDP aware 

## 2013-11-13 ENCOUNTER — Ambulatory Visit (HOSPITAL_COMMUNITY): Payer: Self-pay | Admitting: Psychiatry

## 2013-11-17 ENCOUNTER — Ambulatory Visit (INDEPENDENT_AMBULATORY_CARE_PROVIDER_SITE_OTHER): Payer: 59 | Admitting: Psychiatry

## 2013-11-17 DIAGNOSIS — F3162 Bipolar disorder, current episode mixed, moderate: Secondary | ICD-10-CM

## 2013-11-17 NOTE — Patient Instructions (Signed)
Discussed orally 

## 2013-11-17 NOTE — Progress Notes (Signed)
   THERAPIST PROGRESS NOTE  Session Time: Monday 11/17/2013  Participation Level: Active  Behavioral Response: CasualAlertIrritable  Type of Therapy: Individual Therapy  Treatment Goals addressed: Establishing therapeutic alliance, improve ability to manage stress  Interventions: Supportive  Summary: Rebecca Boyd is a 38 y.o. female who presents with is referred for services by PCP Dr. Tula Nakayama due to patient experiencing mood disturbances. Patient states her doctor thinks she may have bipolar disorder. Patient reports being angry and irritable.and states little things bother her nerves "real quick". She reports times of being "real down" and crying for an hour or two. She also reports periods of being very argumentative, talking loud, and talking junk. She sometimes spends excessively and doesn't have the money to pay her bills. She reports work stress as she states co-workers get on her nerves easily. She has had verbal conflict and exchanges with some of her co-workers. She states sometimes becoming so angry she wants to choke the other person but being able to walk away. Patient reports only sleeping about 3-4 hours per night and experiencing constant fatigue. She attributes this partially to swing shift at work. Patient reports a long time pattern of worrying and states worrying about various issues including her job and finances. Paint also reports chronic back pain due to degenerative disc disease and arthritis.  Patient reports little to no change in symptoms since last session. She has started a more stable work schedule but still reports significant sleep difficulty and says she sleeps about  3-4 hours per night. She remains irritable and easily agitated and says she starts arguments almost daily. She states disliking being around people and often just staying in her room. Patient also reports psychomotor agitation and racing thoughts. Patient reports patten of unrestrained  buying sprees and then being unable to pay her bills.    Suicidal/Homicidal: No  Therapist Response: Therapist works with patient to gather more information on symptoms, identify ways to improve self-care, practice relaxation technique using diaphragmatic breathing.  Plan: Return again in 2 weeks. Patient is scheduled to see psychiatrist Dr. Harrington Challenger for medication evaluation on 11/20/2013  Diagnosis: Axis I: Bipolar, mixed    Axis II: Deferred    Aashna Matson, LCSW 11/17/2013

## 2013-11-19 ENCOUNTER — Emergency Department (HOSPITAL_COMMUNITY)
Admission: EM | Admit: 2013-11-19 | Discharge: 2013-11-19 | Disposition: A | Discharge status: Home / Self Care | Payer: 59 | Attending: Emergency Medicine | Admitting: Emergency Medicine

## 2013-11-19 ENCOUNTER — Encounter (HOSPITAL_COMMUNITY): Payer: Self-pay | Admitting: Emergency Medicine

## 2013-11-19 DIAGNOSIS — R109 Unspecified abdominal pain: Secondary | ICD-10-CM | POA: Insufficient documentation

## 2013-11-19 DIAGNOSIS — Z9889 Other specified postprocedural states: Secondary | ICD-10-CM | POA: Insufficient documentation

## 2013-11-19 DIAGNOSIS — Z792 Long term (current) use of antibiotics: Secondary | ICD-10-CM | POA: Insufficient documentation

## 2013-11-19 DIAGNOSIS — F17200 Nicotine dependence, unspecified, uncomplicated: Secondary | ICD-10-CM | POA: Insufficient documentation

## 2013-11-19 DIAGNOSIS — F411 Generalized anxiety disorder: Secondary | ICD-10-CM | POA: Insufficient documentation

## 2013-11-19 DIAGNOSIS — R5383 Other fatigue: Secondary | ICD-10-CM

## 2013-11-19 DIAGNOSIS — F3289 Other specified depressive episodes: Secondary | ICD-10-CM | POA: Insufficient documentation

## 2013-11-19 DIAGNOSIS — R1010 Upper abdominal pain, unspecified: Secondary | ICD-10-CM

## 2013-11-19 DIAGNOSIS — R110 Nausea: Secondary | ICD-10-CM | POA: Insufficient documentation

## 2013-11-19 DIAGNOSIS — F329 Major depressive disorder, single episode, unspecified: Secondary | ICD-10-CM | POA: Insufficient documentation

## 2013-11-19 DIAGNOSIS — R5381 Other malaise: Secondary | ICD-10-CM | POA: Insufficient documentation

## 2013-11-19 DIAGNOSIS — Z862 Personal history of diseases of the blood and blood-forming organs and certain disorders involving the immune mechanism: Secondary | ICD-10-CM | POA: Insufficient documentation

## 2013-11-19 DIAGNOSIS — Z8742 Personal history of other diseases of the female genital tract: Secondary | ICD-10-CM | POA: Insufficient documentation

## 2013-11-19 DIAGNOSIS — G43909 Migraine, unspecified, not intractable, without status migrainosus: Secondary | ICD-10-CM | POA: Insufficient documentation

## 2013-11-19 DIAGNOSIS — R1013 Epigastric pain: Secondary | ICD-10-CM | POA: Insufficient documentation

## 2013-11-19 DIAGNOSIS — Z91040 Latex allergy status: Secondary | ICD-10-CM | POA: Insufficient documentation

## 2013-11-19 DIAGNOSIS — Z79899 Other long term (current) drug therapy: Secondary | ICD-10-CM | POA: Insufficient documentation

## 2013-11-19 LAB — COMPREHENSIVE METABOLIC PANEL
ALT: 18 U/L (ref 0–35)
AST: 17 U/L (ref 0–37)
Albumin: 3.5 g/dL (ref 3.5–5.2)
Alkaline Phosphatase: 93 U/L (ref 39–117)
Anion gap: 10 (ref 5–15)
BUN: 13 mg/dL (ref 6–23)
CO2: 26 mEq/L (ref 19–32)
Calcium: 9.2 mg/dL (ref 8.4–10.5)
Chloride: 105 mEq/L (ref 96–112)
Creatinine, Ser: 0.88 mg/dL (ref 0.50–1.10)
GFR calc Af Amer: >90 mL/min (ref >90)
GFR calc non Af Amer: 83 mL/min — ABNORMAL LOW (ref >90)
Glucose, Bld: 93 mg/dL (ref 70–99)
Potassium: 4.1 mEq/L (ref 3.7–5.3)
Sodium: 141 mEq/L (ref 137–147)
Total Bilirubin: 0.2 mg/dL — ABNORMAL LOW (ref 0.3–1.2)
Total Protein: 6.9 g/dL (ref 6.0–8.3)

## 2013-11-19 LAB — CBC WITH DIFFERENTIAL/PLATELET
Basophils Absolute: 0 10*3/uL (ref 0.0–0.1)
Basophils Relative: 0 % (ref 0–1)
Eosinophils Absolute: 0.1 10*3/uL (ref 0.0–0.7)
Eosinophils Relative: 2 % (ref 0–5)
HCT: 31.5 % — ABNORMAL LOW (ref 36.0–46.0)
Hemoglobin: 10.2 g/dL — ABNORMAL LOW (ref 12.0–15.0)
Lymphocytes Relative: 29 % (ref 12–46)
Lymphs Abs: 1 10*3/uL (ref 0.7–4.0)
MCH: 22.6 pg — ABNORMAL LOW (ref 26.0–34.0)
MCHC: 32.4 g/dL (ref 30.0–36.0)
MCV: 69.8 fL — ABNORMAL LOW (ref 78.0–100.0)
Monocytes Absolute: 0.3 10*3/uL (ref 0.1–1.0)
Monocytes Relative: 8 % (ref 3–12)
Neutro Abs: 1.9 10*3/uL (ref 1.7–7.7)
Neutrophils Relative %: 61 % (ref 43–77)
Platelets: 129 10*3/uL — ABNORMAL LOW (ref 150–400)
RBC: 4.51 MIL/uL (ref 3.87–5.11)
RDW: 22.2 % — ABNORMAL HIGH (ref 11.5–15.5)
WBC: 3.3 10*3/uL — ABNORMAL LOW (ref 4.0–10.5)

## 2013-11-19 LAB — LIPASE, BLOOD: Lipase: 27 U/L (ref 11–59)

## 2013-11-19 MED ORDER — FAMOTIDINE 20 MG PO TABS
20.0000 mg | ORAL_TABLET | Freq: Once | ORAL | Status: AC
  Administered 2013-11-19: 20 mg via ORAL
  Filled 2013-11-19: qty 1

## 2013-11-19 MED ORDER — GI COCKTAIL ~~LOC~~
30.0000 mL | Freq: Once | ORAL | Status: AC
  Administered 2013-11-19: 30 mL via ORAL
  Filled 2013-11-19: qty 30

## 2013-11-19 MED ORDER — FAMOTIDINE 20 MG PO TABS: 20.0000 mg | ORAL_TABLET | Freq: Two times a day (BID) | ORAL | Status: DC

## 2013-11-19 NOTE — ED Provider Notes (Signed)
CSN: [id number]     Arrival date & time 11/19/13  1947 History   This chart was scribed for Virgel Manifold, MD by Lowella Petties, ED Scribe. The patient was seen in room APA09/APA09. Patient's care was started at 9:12 PM.   Chief Complaint  Patient presents with  . Abdominal Pain  . Nausea   The history is provided by the patient. No language interpreter was used.   HPI Comments: Rebecca Boyd is a 38 y.o. female with a history of hysteroscopy with thermachoice ablation and cervical cryotherapy who presents to the Emergency Department complaining of constant, non radiation, upper abdominal pain onset 5 hours ago. She reports associated nausea and generalized weakness. She denies any modifying factors making it better or worse. She denies dysuria, hematuria, fever, chills, burping, ulcers, reflux, or any pain like this in the past. She reports that she used to take an iron supplement, but stopped when she felt better. She also reports pain in her left ear, which she attributes to a tooth that needs to be pulled. She denies any drainage from her ear. She drinks socially and does not smoke cigarettes.   Past Medical History  Diagnosis Date  . Depressive disorder, not elsewhere classified   . Neck pain   . Vaginitis     cyctitis  . Miscarriage 2009  . Anemia   . Anxiety   . Migraine    Past Surgical History  Procedure Laterality Date  . Cervical cryotherapy N/A 1999  . Laparoscopic bilateral salpingectomy Bilateral 08/26/2013    Procedure: LAPAROSCOPIC BILATERAL SALPINGECTOMY AND REMOVAL OF LEFT PERITUBAL CYST Procedure #1;  Surgeon: Jonnie Kind, MD;  Location: [facility];  Service: Gynecology;  Laterality: Bilateral;  . Dilitation & currettage/hystroscopy with thermachoice ablation  08/26/2013    Procedure: DILATATION & CURETTAGE/HYSTEROSCOPY WITH THERMACHOICE ENDOMETRIAL ABLATION Procedure #2 Total Therapy Time=min       sec;  Surgeon: Jonnie Kind, MD;  Location: [facility];  Service:  Gynecology;;  . Vulvar lesion removal  08/26/2013    Procedure: REMOVAL OF VULVAR SEBACEOUS CYST Procedure #3;  Surgeon: Jonnie Kind, MD;  Location: [facility];  Service: Gynecology;;  . Laparoscopic lysis of adhesions  08/26/2013    Procedure: LAPAROSCOPIC LYSIS OF ADHESIONS Procedure #1;  Surgeon: Jonnie Kind, MD;  Location: [facility];  Service: Gynecology;;   Family History  Problem Relation Age of Onset  . Drug abuse Mother   . Hypertension Father   . Hypertension Maternal Grandmother   . Hypertension Maternal Grandfather   . Hypertension Paternal Grandmother    History  Substance Use Topics  . Smoking status: Light Tobacco Smoker -- 0.25 packs/day    Types: Cigarettes  . Smokeless tobacco: Never Used  . Alcohol Use: Yes     Comment: occasional   OB History   Grav Para Term Preterm Abortions TAB SAB Ect Mult Living                 Review of Systems  All other systems reviewed and are negative.  Allergies  Diphenhydramine hcl; Orange fruit; and Latex  Home Medications   Prior to Admission medications   Medication Sig Start Date End Date Taking? Authorizing Provider  albuterol (PROVENTIL HFA;VENTOLIN HFA) 108 (90 BASE) MCG/ACT inhaler Inhale 2 puffs into the lungs every 6 (six) hours as needed for wheezing or shortness of breath.    Historical Provider, MD  ALPRAZolam Duanne Moron) 1 MG tablet Take 1 mg by mouth  at bedtime.    Historical Provider, MD  cyclobenzaprine (FLEXERIL) 10 MG tablet Take 10 mg by mouth at bedtime as needed for muscle spasms. 07/30/13   Fayrene Helper, MD  FLUoxetine (PROZAC) 20 MG capsule Take 1 capsule (20 mg total) by mouth daily. 05/29/13 05/29/14  Fayrene Helper, MD  metroNIDAZOLE (METROGEL) 0.75 % gel Apply 1 application topically as needed.    Historical Provider, MD  oxyCODONE-acetaminophen (PERCOCET) 5-325 MG per tablet One tablet at bedtime for back pain 11/03/13   Fayrene Helper, MD   Triage Vitals: BP 141/85  Pulse 93  Temp(Src) 99.1  F (37.3 C) (Oral)  Resp 24  Ht 5' (1.524 m)  Wt 145 lb (65.772 kg)  BMI 28.32 kg/m2  SpO2 100%  LMP 10/26/2013 Physical Exam  Nursing note and vitals reviewed. Constitutional: She is oriented to person, place, and time. She appears well-developed and well-nourished. No distress.  HENT:  Head: Normocephalic and atraumatic.  TM and external auditory canals are clear bilaterally.   Eyes: EOM are normal.  Neck: Normal range of motion.  Cardiovascular: Normal rate, regular rhythm and normal heart sounds.   Pulmonary/Chest: Effort normal and breath sounds normal.  Abdominal: Soft. She exhibits no distension. There is tenderness (mild, epigastric).  No CVA tenderness.   Musculoskeletal: Normal range of motion.  Neurological: She is alert and oriented to person, place, and time.  Skin: Skin is warm and dry.  Psychiatric: She has a normal mood and affect. Judgment normal.    ED Course  Procedures (including critical care time) DIAGNOSTIC STUDIES: Oxygen Saturation is 100% on room air, normal by my interpretation.    COORDINATION OF CARE: 9:19 PM-Discussed treatment plan which includes blood work with pt at bedside and pt agreed to plan.   Labs Review Labs Reviewed  CBC WITH DIFFERENTIAL - Abnormal; Notable for the following:    WBC 3.3 (*)    Hemoglobin 10.2 (*)    HCT 31.5 (*)    MCV 69.8 (*)    MCH 22.6 (*)    RDW 22.2 (*)    Platelets 129 (*)    All other components within normal limits  COMPREHENSIVE METABOLIC PANEL - Abnormal; Notable for the following:    Total Bilirubin 0.2 (*)    GFR calc non Af Amer 83 (*)    All other components within normal limits  LIPASE, BLOOD    Imaging Review No results found.   EKG Interpretation None      MDM   Final diagnoses:  Pain of upper abdomen    38 year old female with upper abdominal pain. Her physical exam is pretty benign. Workup fairly unremarkable as well. Possibly gastritis or PUD. Will give course of an H2  blocker. Return precautions were discussed.  I personally preformed the services scribed in my presence. The recorded information has been reviewed is accurate. Virgel Manifold, MD.    Virgel Manifold, MD 11/20/13 [phone]

## 2013-11-19 NOTE — ED Notes (Signed)
Pt reporting generalized abdominal pain in upper portion.  Reporting nausea, but denies vomiting.  States "I dont' know if my iron is low or if I am dehydrated.  I'm just weak."

## 2013-11-19 NOTE — Discharge Instructions (Signed)

## 2013-11-20 ENCOUNTER — Telehealth (HOSPITAL_COMMUNITY): Payer: Self-pay | Admitting: *Deleted

## 2013-11-20 ENCOUNTER — Encounter (HOSPITAL_COMMUNITY): Payer: Self-pay | Admitting: Psychiatry

## 2013-11-20 ENCOUNTER — Ambulatory Visit (INDEPENDENT_AMBULATORY_CARE_PROVIDER_SITE_OTHER): Payer: 59 | Admitting: Psychiatry

## 2013-11-20 VITALS — BP 120/82 | Ht 60.0 in | Wt 147.0 lb

## 2013-11-20 DIAGNOSIS — F419 Anxiety disorder, unspecified: Secondary | ICD-10-CM

## 2013-11-20 DIAGNOSIS — N750 Cyst of Bartholin's gland: Secondary | ICD-10-CM

## 2013-11-20 DIAGNOSIS — N920 Excessive and frequent menstruation with regular cycle: Secondary | ICD-10-CM

## 2013-11-20 DIAGNOSIS — R894 Abnormal immunological findings in specimens from other organs, systems and tissues: Secondary | ICD-10-CM

## 2013-11-20 DIAGNOSIS — R51 Headache: Secondary | ICD-10-CM

## 2013-11-20 DIAGNOSIS — F39 Unspecified mood [affective] disorder: Secondary | ICD-10-CM

## 2013-11-20 DIAGNOSIS — R768 Other specified abnormal immunological findings in serum: Secondary | ICD-10-CM

## 2013-11-20 DIAGNOSIS — F411 Generalized anxiety disorder: Secondary | ICD-10-CM

## 2013-11-20 DIAGNOSIS — E559 Vitamin D deficiency, unspecified: Secondary | ICD-10-CM

## 2013-11-20 DIAGNOSIS — F329 Major depressive disorder, single episode, unspecified: Secondary | ICD-10-CM

## 2013-11-20 DIAGNOSIS — Z1211 Encounter for screening for malignant neoplasm of colon: Secondary | ICD-10-CM

## 2013-11-20 DIAGNOSIS — F32A Depression, unspecified: Secondary | ICD-10-CM

## 2013-11-20 DIAGNOSIS — F3289 Other specified depressive episodes: Secondary | ICD-10-CM

## 2013-11-20 DIAGNOSIS — N771 Vaginitis, vulvitis and vulvovaginitis in diseases classified elsewhere: Secondary | ICD-10-CM

## 2013-11-20 DIAGNOSIS — Z113 Encounter for screening for infections with a predominantly sexual mode of transmission: Secondary | ICD-10-CM

## 2013-11-20 DIAGNOSIS — Z139 Encounter for screening, unspecified: Secondary | ICD-10-CM

## 2013-11-20 MED ORDER — QUETIAPINE FUMARATE 50 MG PO TABS: 50.0000 mg | ORAL_TABLET | Freq: Every day | ORAL | Status: DC

## 2013-11-20 MED ORDER — FLUOXETINE HCL 20 MG PO CAPS: 20.0000 mg | ORAL_CAPSULE | Freq: Every day | ORAL | Status: DC
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
## 2013-11-20 NOTE — Telephone Encounter (Signed)
done

## 2013-11-24 ENCOUNTER — Telehealth (HOSPITAL_COMMUNITY): Payer: Self-pay | Admitting: *Deleted

## 2013-11-28 ENCOUNTER — Telehealth (HOSPITAL_COMMUNITY): Payer: Self-pay | Admitting: *Deleted

## 2013-12-01 ENCOUNTER — Telehealth: Payer: Self-pay | Admitting: *Deleted

## 2013-12-01 MED ORDER — ALPRAZOLAM 1 MG PO TABS: 1.0000 mg | ORAL_TABLET | Freq: Every day | ORAL | Status: DC

## 2013-12-01 NOTE — Telephone Encounter (Signed)
Med refilled.

## 2013-12-01 NOTE — Telephone Encounter (Signed)
Pt called wanting a refill on her xanax

## 2013-12-03 ENCOUNTER — Ambulatory Visit (HOSPITAL_COMMUNITY): Payer: Self-pay | Admitting: Psychiatry

## 2013-12-05 ENCOUNTER — Other Ambulatory Visit: Payer: Self-pay

## 2013-12-05 MED ORDER — OXYCODONE-ACETAMINOPHEN 5-325 MG PO TABS: 1.0000 | ORAL_TABLET | Freq: Every evening | ORAL | Status: DC | PRN

## 2013-12-18 ENCOUNTER — Ambulatory Visit (HOSPITAL_COMMUNITY): Payer: Self-pay | Admitting: Psychiatry

## 2013-12-23 ENCOUNTER — Ambulatory Visit (HOSPITAL_COMMUNITY): Payer: Self-pay | Admitting: Psychiatry

## 2013-12-23 ENCOUNTER — Encounter (HOSPITAL_COMMUNITY): Payer: Self-pay | Admitting: Psychiatry

## 2013-12-31 ENCOUNTER — Ambulatory Visit (HOSPITAL_COMMUNITY): Payer: Self-pay | Admitting: Psychiatry

## 2014-01-07 ENCOUNTER — Encounter (INDEPENDENT_AMBULATORY_CARE_PROVIDER_SITE_OTHER): Payer: Self-pay

## 2014-01-07 ENCOUNTER — Encounter: Payer: Self-pay | Admitting: Family Medicine

## 2014-01-07 ENCOUNTER — Ambulatory Visit (INDEPENDENT_AMBULATORY_CARE_PROVIDER_SITE_OTHER): Payer: 59 | Admitting: Family Medicine

## 2014-01-07 VITALS — BP 150/80 | HR 82 | Resp 16 | Ht 60.0 in | Wt 147.8 lb

## 2014-01-07 DIAGNOSIS — L259 Unspecified contact dermatitis, unspecified cause: Secondary | ICD-10-CM

## 2014-01-07 DIAGNOSIS — L309 Dermatitis, unspecified: Secondary | ICD-10-CM

## 2014-01-07 DIAGNOSIS — M545 Low back pain, unspecified: Secondary | ICD-10-CM

## 2014-01-07 DIAGNOSIS — I10 Essential (primary) hypertension: Secondary | ICD-10-CM

## 2014-01-07 MED ORDER — CEPHALEXIN 500 MG PO CAPS: 500.0000 mg | ORAL_CAPSULE | Freq: Two times a day (BID) | ORAL | Status: DC

## 2014-01-07 MED ORDER — HYDROXYZINE HCL 25 MG PO TABS: ORAL_TABLET | ORAL | Status: DC

## 2014-01-07 MED ORDER — METHYLPREDNISOLONE ACETATE 80 MG/ML IJ SUSP
80.0000 mg | Freq: Once | INTRAMUSCULAR | Status: AC
  Administered 2014-01-07: 80 mg via INTRAMUSCULAR

## 2014-01-07 MED ORDER — AMLODIPINE BESYLATE 2.5 MG PO TABS: 2.5000 mg | ORAL_TABLET | Freq: Every day | ORAL | Status: DC

## 2014-01-07 MED ORDER — CYCLOBENZAPRINE HCL 10 MG PO TABS: 10.0000 mg | ORAL_TABLET | Freq: Every evening | ORAL | Status: DC | PRN

## 2014-01-07 MED ORDER — PREDNISONE (PAK) 5 MG PO TABS: 5.0000 mg | ORAL_TABLET | ORAL | Status: DC

## 2014-01-07 NOTE — Patient Instructions (Addendum)
F/u in  2 month, call if you need me before  You are treated for dermatitis. Injection in office and 3 medications sent to your pharmacy, take as directed    Blood pressure is high, new is amlodipine one daily  Follow handout to help lower your blood pressure also please

## 2014-01-09 ENCOUNTER — Other Ambulatory Visit: Payer: Self-pay

## 2014-01-09 MED ORDER — OXYCODONE-ACETAMINOPHEN 5-325 MG PO TABS: 1.0000 | ORAL_TABLET | Freq: Every evening | ORAL | Status: DC | PRN

## 2014-01-11 NOTE — Assessment & Plan Note (Signed)
DASH diet and commitment to daily physical activity for a minimum of 30 minutes discussed and encouraged, as a part of hypertension management. The importance of attaining a healthy weight is also discussed. Start amlodipine daily

## 2014-01-11 NOTE — Assessment & Plan Note (Signed)
Acute inflammation with generalized rash Steroid in office and orally as well as medication for itching. Specific irritant currently unknown

## 2014-01-11 NOTE — Progress Notes (Signed)
   Subjective:    Patient ID: Rebecca Boyd, female    DOB: 06-19-1975, 38 y.o.   MRN: [id number]  HPI Three day h/o generalized rash involving upper extremities and back, no fever, chills, no new products , no one else affected similarly. Denies difficulty breathing or swallowing Improvement in back pain and depression   Review of Systems See HPI Denies recent fever or chills. Denies sinus pressure, nasal congestion, ear pain or sore throat. Denies chest congestion, productive cough or wheezing. Denies chest pains, palpitations and leg swelling      Objective:   Physical Exam BP 150/80  Pulse 82  Resp 16  Ht 5' (1.524 m)  Wt 147 lb 12.8 oz (67.042 kg)  BMI 28.87 kg/m2  SpO2 100% Patient alert and oriented and in no cardiopulmonary distress.  HEENT: No facial asymmetry, EOMI,   oropharynx pink and moist.  Neck supple no JVD, no mass.  Chest: Clear to auscultation bilaterally.  CVS: S1, S2 no murmurs, no S3.Regular rate.  ABD: Soft non tender.   Ext: No edema    Skin: Intact, macular papular erythematous rash affecting upper extremities and back  Psych: Good eye contact, normal affect. Memory intact not anxious or depressed appearing.  CNS: CN 2-12 intact, power,  normal throughout.no focal deficits noted.        Assessment & Plan:  Dermatitis Acute inflammation with generalized rash Steroid in office and orally as well as medication for itching. Specific irritant currently unknown  Essential hypertension, benign DASH diet and commitment to daily physical activity for a minimum of 30 minutes discussed and encouraged, as a part of hypertension management. The importance of attaining a healthy weight is also discussed. Start amlodipine daily  Lumbar back pain Improved with epidural

## 2014-01-11 NOTE — Assessment & Plan Note (Signed)
Improved with epidural

## 2014-01-23 ENCOUNTER — Ambulatory Visit (HOSPITAL_COMMUNITY): Payer: Self-pay | Admitting: Psychiatry

## 2014-01-23 ENCOUNTER — Encounter (HOSPITAL_COMMUNITY): Payer: Self-pay | Admitting: Psychiatry

## 2014-01-28 ENCOUNTER — Encounter: Payer: Self-pay | Admitting: Family Medicine

## 2014-01-28 ENCOUNTER — Ambulatory Visit (INDEPENDENT_AMBULATORY_CARE_PROVIDER_SITE_OTHER): Payer: 59 | Admitting: Family Medicine

## 2014-01-28 VITALS — BP 130/80 | HR 77 | Resp 16 | Ht 60.0 in | Wt 144.0 lb

## 2014-01-28 DIAGNOSIS — F3289 Other specified depressive episodes: Secondary | ICD-10-CM

## 2014-01-28 DIAGNOSIS — F329 Major depressive disorder, single episode, unspecified: Secondary | ICD-10-CM

## 2014-01-28 DIAGNOSIS — L309 Dermatitis, unspecified: Secondary | ICD-10-CM

## 2014-01-28 DIAGNOSIS — M546 Pain in thoracic spine: Secondary | ICD-10-CM

## 2014-01-28 DIAGNOSIS — I10 Essential (primary) hypertension: Secondary | ICD-10-CM

## 2014-01-28 DIAGNOSIS — L259 Unspecified contact dermatitis, unspecified cause: Secondary | ICD-10-CM

## 2014-01-28 DIAGNOSIS — D500 Iron deficiency anemia secondary to blood loss (chronic): Secondary | ICD-10-CM

## 2014-01-28 DIAGNOSIS — F32A Depression, unspecified: Secondary | ICD-10-CM

## 2014-01-28 MED ORDER — CEPHALEXIN 500 MG PO CAPS: 500.0000 mg | ORAL_CAPSULE | Freq: Two times a day (BID) | ORAL | Status: DC

## 2014-01-28 MED ORDER — BETAMETHASONE VALERATE 0.1 % EX OINT: TOPICAL_OINTMENT | CUTANEOUS | Status: DC

## 2014-01-28 MED ORDER — PREDNISONE 5 MG PO TABS: 5.0000 mg | ORAL_TABLET | Freq: Two times a day (BID) | ORAL | Status: AC

## 2014-01-28 MED ORDER — ALPRAZOLAM 1 MG PO TABS: 1.0000 mg | ORAL_TABLET | Freq: Every day | ORAL | Status: DC

## 2014-01-28 NOTE — Patient Instructions (Signed)
F/u in 4.5 month, call if you need me before  Blood pressure is now good, continue amlodipine one daily  Short course of prednisone, cream to be applied , as needed,  And antibiotic is prescribed for the rash , which has improved  Work on regular exercise and increased fruit and vegetable in your diet  Start iron (OTC) 325 mg one daily, you are anemic  Reschedule appt with Dr Harrington Challenger  CBC, fasting lipid, chem 7, in 4.5 month, before visit

## 2014-01-31 NOTE — Assessment & Plan Note (Signed)
Improved , but still not fully cleared , rept dose of prednisone and antibiotic, add eurax also for coverage for scabies

## 2014-01-31 NOTE — Assessment & Plan Note (Signed)
Pt reminded of the need to take daily iron supplement

## 2014-01-31 NOTE — Assessment & Plan Note (Signed)
Controlled, no change in medication DASH diet and commitment to daily physical activity for a minimum of 30 minutes discussed and encouraged, as a part of hypertension management. The importance of attaining a healthy weight is also discussed.  

## 2014-01-31 NOTE — Assessment & Plan Note (Signed)
Continue daily hydrocodone

## 2014-01-31 NOTE — Progress Notes (Signed)
   Subjective:    Patient ID: Rebecca Boyd, female    DOB: [birth year], 38 y.o.   MRN: [id number]  HPI The PT is here for follow up and re-evaluation of chronic medical conditions,specifically hypertension and skin rash medication management and review of any available recent lab and radiology data.  Preventive health is updated, specifically  Cancer screening and Immunization.   Rebecca Boyd benefited from psych involvement in depression and anxiety.  . The PT denies any adverse reactions to current medications since the last visit.  There are no new concerns.  Rash improved but still present, has similar in the past    Review of Systems See HPI Denies recent fever or chills. Denies sinus pressure, nasal congestion, ear pain or sore throat. Denies chest congestion, productive cough or wheezing. Denies chest pains, palpitations and leg swelling Denies abdominal pain, nausea, vomiting,diarrhea or constipation.   Denies dysuria, frequency, hesitancy or incontinence. Denies joint pain, swelling and limitation in mobility. Denies headaches, seizures, numbness, or tingling. Denies uncontrolled  depression, anxiety or insomnia.       Objective:   Physical Exam  BP 130/80  Pulse 77  Resp 16  Ht 5' (1.524 m)  Wt 144 lb (65.318 kg)  BMI 28.12 kg/m2  SpO2 100% Patient alert and oriented and in no cardiopulmonary distress.  HEENT: No facial asymmetry, EOMI,   oropharynx pink and moist.  Neck supple no JVD, no mass.  Chest: Clear to auscultation bilaterally.  CVS: S1, S2 no murmurs, no S3.Regular rate.  ABD: Soft non tender.   Ext: No edema  MS: Adequate ROM spine, shoulders, hips and knees.  Skin:hyperpigmented macular rash involving trunk and extremities, open lesions in areas where pt has scratched excessively  Psych: Good eye contact, normal affect. Memory intact not anxious or depressed appearing.  CNS: CN 2-12 intact, power,  normal throughout.no focal deficits  noted.       Assessment & Plan:  Essential hypertension, benign Controlled, no change in medication DASH diet and commitment to daily physical activity for a minimum of 30 minutes discussed and encouraged, as a part of hypertension management. The importance of attaining a healthy weight is also discussed.   Dermatitis Improved , but still not fully cleared , rept dose of prednisone and antibiotic, add eurax also for coverage for scabies  Iron deficiency anemia due to chronic blood loss Pt reminded of the need to take daily iron supplement  Pain in thoracic spine Continue daily hydrocodone  Depression Now beingf treated by psychiatry , with improvement in symptoms, needs to f/u

## 2014-01-31 NOTE — Assessment & Plan Note (Signed)
Now beingf treated by psychiatry , with improvement in symptoms, needs to f/u

## 2014-02-02 ENCOUNTER — Other Ambulatory Visit: Payer: Self-pay

## 2014-02-02 DIAGNOSIS — L309 Dermatitis, unspecified: Secondary | ICD-10-CM

## 2014-02-02 MED ORDER — CROTAMITON 10 % EX CREA: TOPICAL_CREAM | CUTANEOUS | Status: DC

## 2014-02-18 ENCOUNTER — Other Ambulatory Visit: Payer: Self-pay

## 2014-02-18 MED ORDER — OXYCODONE-ACETAMINOPHEN 5-325 MG PO TABS: 1.0000 | ORAL_TABLET | Freq: Every evening | ORAL | Status: DC | PRN

## 2014-03-11 ENCOUNTER — Ambulatory Visit: Payer: Self-pay | Admitting: Family Medicine

## 2014-03-19 ENCOUNTER — Other Ambulatory Visit: Payer: Self-pay

## 2014-03-19 MED ORDER — OXYCODONE-ACETAMINOPHEN 5-325 MG PO TABS: 1.0000 | ORAL_TABLET | Freq: Every evening | ORAL | Status: DC | PRN

## 2014-04-01 ENCOUNTER — Telehealth: Payer: Self-pay

## 2014-04-01 NOTE — Telephone Encounter (Signed)
schedule for when available  or offer appt with neurology in [place] none available in [place]

## 2014-04-04 ENCOUNTER — Emergency Department (HOSPITAL_COMMUNITY)
Admission: EM | Admit: 2014-04-04 | Discharge: 2014-04-04 | Disposition: A | Discharge status: Home / Self Care | Payer: 59 | Attending: Emergency Medicine | Admitting: Emergency Medicine

## 2014-04-04 ENCOUNTER — Emergency Department (HOSPITAL_COMMUNITY): Payer: 59

## 2014-04-04 ENCOUNTER — Encounter (HOSPITAL_COMMUNITY): Payer: Self-pay | Admitting: Emergency Medicine

## 2014-04-04 DIAGNOSIS — Z91040 Latex allergy status: Secondary | ICD-10-CM | POA: Diagnosis not present

## 2014-04-04 DIAGNOSIS — Z862 Personal history of diseases of the blood and blood-forming organs and certain disorders involving the immune mechanism: Secondary | ICD-10-CM | POA: Insufficient documentation

## 2014-04-04 DIAGNOSIS — Z7952 Long term (current) use of systemic steroids: Secondary | ICD-10-CM | POA: Diagnosis not present

## 2014-04-04 DIAGNOSIS — R51 Headache: Secondary | ICD-10-CM | POA: Diagnosis not present

## 2014-04-04 DIAGNOSIS — Z8742 Personal history of other diseases of the female genital tract: Secondary | ICD-10-CM | POA: Diagnosis not present

## 2014-04-04 DIAGNOSIS — J40 Bronchitis, not specified as acute or chronic: Secondary | ICD-10-CM

## 2014-04-04 DIAGNOSIS — Z792 Long term (current) use of antibiotics: Secondary | ICD-10-CM | POA: Insufficient documentation

## 2014-04-04 DIAGNOSIS — J069 Acute upper respiratory infection, unspecified: Secondary | ICD-10-CM | POA: Insufficient documentation

## 2014-04-04 DIAGNOSIS — Z79899 Other long term (current) drug therapy: Secondary | ICD-10-CM | POA: Insufficient documentation

## 2014-04-04 DIAGNOSIS — R058 Other specified cough: Secondary | ICD-10-CM

## 2014-04-04 DIAGNOSIS — R05 Cough: Secondary | ICD-10-CM

## 2014-04-04 DIAGNOSIS — R0981 Nasal congestion: Secondary | ICD-10-CM | POA: Diagnosis present

## 2014-04-04 MED ORDER — PHENYLEPH-PROMETHAZINE-COD 5-6.25-10 MG/5ML PO SYRP: ORAL_SOLUTION | ORAL | Status: DC

## 2014-04-04 MED ORDER — CEFUROXIME AXETIL 500 MG PO TABS: 500.0000 mg | ORAL_TABLET | Freq: Two times a day (BID) | ORAL | Status: DC

## 2014-04-04 MED ORDER — IPRATROPIUM-ALBUTEROL 0.5-2.5 (3) MG/3ML IN SOLN
RESPIRATORY_TRACT | Status: AC
  Administered 2014-04-04: 3 mL via RESPIRATORY_TRACT
  Filled 2014-04-04: qty 3

## 2014-04-04 MED ORDER — PREDNISONE 10 MG PO TABS: ORAL_TABLET | ORAL | Status: DC

## 2014-04-04 MED ORDER — IPRATROPIUM BROMIDE 0.02 % IN SOLN: 0.5000 mg | Freq: Once | RESPIRATORY_TRACT | Status: DC

## 2014-04-04 MED ORDER — HYDROCODONE-ACETAMINOPHEN 5-325 MG PO TABS
1.0000 | ORAL_TABLET | Freq: Once | ORAL | Status: AC
Start: 2014-04-04 — End: 2014-04-04
  Administered 2014-04-04: 1 via ORAL
  Filled 2014-04-04: qty 1

## 2014-04-04 MED ORDER — ALBUTEROL SULFATE HFA 108 (90 BASE) MCG/ACT IN AERS: 2.0000 | INHALATION_SPRAY | RESPIRATORY_TRACT | Status: DC | PRN

## 2014-04-04 MED ORDER — IPRATROPIUM-ALBUTEROL 0.5-2.5 (3) MG/3ML IN SOLN
3.0000 mL | Freq: Once | RESPIRATORY_TRACT | Status: AC
  Administered 2014-04-04: 3 mL via RESPIRATORY_TRACT

## 2014-04-04 MED ORDER — PREDNISONE 50 MG PO TABS
60.0000 mg | ORAL_TABLET | Freq: Once | ORAL | Status: AC
  Administered 2014-04-04: 60 mg via ORAL
  Filled 2014-04-04 (×2): qty 1

## 2014-04-04 MED ORDER — ALBUTEROL SULFATE (2.5 MG/3ML) 0.083% IN NEBU: 2.5000 mg | INHALATION_SOLUTION | Freq: Once | RESPIRATORY_TRACT | Status: DC

## 2014-04-04 MED ORDER — IBUPROFEN 800 MG PO TABS
800.0000 mg | ORAL_TABLET | Freq: Once | ORAL | Status: AC
  Administered 2014-04-04: 800 mg via ORAL
  Filled 2014-04-04: qty 1

## 2014-04-04 MED ORDER — HYDROCOD POLST-CHLORPHEN POLST 10-8 MG/5ML PO LQCR
5.0000 mL | Freq: Once | ORAL | Status: AC
  Administered 2014-04-04: 5 mL via ORAL
  Filled 2014-04-04: qty 5

## 2014-04-04 NOTE — ED Notes (Signed)
Pt c/o nasal congestion, ha, productive cough (green sputum) and intermittent fever since yesterday.

## 2014-04-04 NOTE — ED Notes (Signed)
Sinus congestion, SOB, HA and intermittent chills and myalgias since yesterday.  Maxillary and frontal sinus tenderness to palpation.  No lymphadenopathy.  Posterior pharyngeal redness w/out exudate. Reports minimal sore throat.

## 2014-04-04 NOTE — Discharge Instructions (Signed)
Your oxygen levels are within normal limits. Your examination and x-rays suggest bronchitis and upper respiratory infection. There is an area of scarring on your chest x-ray. Please have Dr. Moshe Cipro to recheck this in about 10 days to 2 weeks. Please use albuterol 2 puffs every 4 hours, prednisone taper as prescribed, Ceftin 2 times daily with food. Use promethazine cough medication if needed for cough. This medication may cause drowsiness, please use with caution. Please wash hands frequently. Please use a mask until symptoms have resolved. Cough, Adult  A cough is a reflex. It helps you clear your throat and airways. A cough can help heal your body. A cough can last 2 or 3 weeks (acute) or may last more than 8 weeks (chronic). Some common causes of a cough can include an infection, allergy, or a cold. HOME CARE  Only take medicine as told by your doctor.  If given, take your medicines (antibiotics) as told. Finish them even if you start to feel better.  Use a cold steam vaporizer or humidifier in your home. This can help loosen thick spit (secretions).  Sleep so you are almost sitting up (semi-upright). Use pillows to do this. This helps reduce coughing.  Rest as needed.  Stop smoking if you smoke. GET HELP RIGHT AWAY IF:  You have yellowish-white fluid (pus) in your thick spit.  Your cough gets worse.  Your medicine does not reduce coughing, and you are losing sleep.  You cough up blood.  You have trouble breathing.  Your pain gets worse and medicine does not help.  You have a fever. MAKE SURE YOU:   Understand these instructions.  Will watch your condition.  Will get help right away if you are not doing well or get worse. Document Released: 12/29/2010 Document Revised: 09/01/2013 Document Reviewed: 12/29/2010 [facility] Patient Information 2015 [place], [place]. This information is not intended to replace advice given to you by your health care provider. Make sure you discuss  any questions you have with your health care provider.

## 2014-04-04 NOTE — ED Notes (Signed)
Patient with no complaints at this time. Respirations even and unlabored. Skin warm/dry. Discharge instructions reviewed with patient at this time. Patient given opportunity to voice concerns/ask questions. Patient discharged at this time and left Emergency Department with steady gait.   

## 2014-04-04 NOTE — ED Provider Notes (Signed)
CSN: [id number]     Arrival date & time 04/04/14  1345 History   None    Chief Complaint  Patient presents with  . Nasal Congestion     (Consider location/radiation/quality/duration/timing/severity/associated sxs/prior Treatment) Patient is a 38 y.o. female presenting with URI. The history is provided by the patient.  URI Presenting symptoms: congestion, cough and fatigue   Severity:  Moderate Onset quality:  Gradual Duration:  1 day Timing:  Intermittent Progression:  Worsening Chronicity:  New Relieved by:  Nothing Worsened by:  Nothing tried Ineffective treatments:  OTC medications Associated symptoms: headaches and sinus pain   Associated symptoms: no arthralgias, no neck pain and no wheezing   Risk factors: sick contacts   Risk factors: no recent travel     Past Medical History  Diagnosis Date  . Depressive disorder, not elsewhere classified   . Neck pain   . Vaginitis     cyctitis  . Miscarriage 2009  . Anemia   . Anxiety   . Migraine    Past Surgical History  Procedure Laterality Date  . Cervical cryotherapy N/A 1999  . Laparoscopic bilateral salpingectomy Bilateral 08/26/2013    Procedure: LAPAROSCOPIC BILATERAL SALPINGECTOMY AND REMOVAL OF LEFT PERITUBAL CYST Procedure #1;  Surgeon: Jonnie Kind, MD;  Location: [facility];  Service: Gynecology;  Laterality: Bilateral;  . Dilitation & currettage/hystroscopy with thermachoice ablation  08/26/2013    Procedure: DILATATION & CURETTAGE/HYSTEROSCOPY WITH THERMACHOICE ENDOMETRIAL ABLATION Procedure #2 Total Therapy Time=min       sec;  Surgeon: Jonnie Kind, MD;  Location: [facility];  Service: Gynecology;;  . Vulvar lesion removal  08/26/2013    Procedure: REMOVAL OF VULVAR SEBACEOUS CYST Procedure #3;  Surgeon: Jonnie Kind, MD;  Location: [facility];  Service: Gynecology;;  . Laparoscopic lysis of adhesions  08/26/2013    Procedure: LAPAROSCOPIC LYSIS OF ADHESIONS Procedure #1;  Surgeon: Jonnie Kind, MD;  Location:  [facility];  Service: Gynecology;;   Family History  Problem Relation Age of Onset  . Drug abuse Mother   . Hypertension Father   . Hypertension Maternal Grandmother   . Hypertension Maternal Grandfather   . Hypertension Paternal Grandmother    History  Substance Use Topics  . Smoking status: Light Tobacco Smoker -- 0.25 packs/day    Types: Cigarettes  . Smokeless tobacco: Never Used  . Alcohol Use: Yes     Comment: occasional   OB History    No data available     Review of Systems  Constitutional: Positive for fatigue. Negative for activity change.       All ROS Neg except as noted in HPI  HENT: Positive for congestion, postnasal drip and sinus pressure. Negative for nosebleeds.   Eyes: Negative for photophobia and discharge.  Respiratory: Positive for cough. Negative for shortness of breath and wheezing.   Cardiovascular: Negative for chest pain and palpitations.  Gastrointestinal: Negative for abdominal pain and blood in stool.  Genitourinary: Negative for dysuria, frequency and hematuria.  Musculoskeletal: Negative for back pain, arthralgias and neck pain.  Skin: Negative.   Neurological: Positive for headaches. Negative for dizziness, seizures and speech difficulty.  Psychiatric/Behavioral: Negative for hallucinations and confusion.      Allergies  Diphenhydramine hcl; Orange fruit; and Latex  Home Medications   Prior to Admission medications   Medication Sig Start Date End Date Taking? Authorizing Provider  albuterol (PROVENTIL HFA;VENTOLIN HFA) 108 (90 BASE) MCG/ACT inhaler Inhale 2 puffs into the lungs every 6 (  six) hours as needed for wheezing or shortness of breath.    Historical Provider, MD  ALPRAZolam Duanne Moron) 1 MG tablet Take 1 tablet (1 mg total) by mouth at bedtime. 01/28/14   Fayrene Helper, MD  amLODipine (NORVASC) 2.5 MG tablet Take 1 tablet (2.5 mg total) by mouth daily. 01/07/14   Fayrene Helper, MD  betamethasone valerate ointment (VALISONE) 0.1  % Apply spariongly twice daily to affected area 01/28/14   Fayrene Helper, MD  cephALEXin (KEFLEX) 500 MG capsule Take 1 capsule (500 mg total) by mouth 2 (two) times daily. 01/28/14   Fayrene Helper, MD  crotamiton (CROTAN) 10 % cream Apply from neck to feet in thin layer over entire body. Repeat once after 24 hours 02/02/14   Fayrene Helper, MD  cyclobenzaprine (FLEXERIL) 10 MG tablet Take 1 tablet (10 mg total) by mouth at bedtime as needed for muscle spasms. 01/07/14   Fayrene Helper, MD  famotidine (PEPCID) 20 MG tablet Take 1 tablet (20 mg total) by mouth 2 (two) times daily. 11/19/13   Virgel Manifold, MD  FLUoxetine (PROZAC) 20 MG capsule Take 1 capsule (20 mg total) by mouth daily. 11/20/13 11/20/14  Levonne Spiller, MD  hydrOXYzine (ATARAX/VISTARIL) 25 MG tablet One tablet at bedtime for itch 01/07/14   Fayrene Helper, MD  oxyCODONE-acetaminophen (PERCOCET/ROXICET) 5-325 MG per tablet Take 1 tablet by mouth at bedtime as needed for moderate pain or severe pain. One tablet at bedtime for back pain 03/19/14   Fayrene Helper, MD  QUEtiapine (SEROQUEL) 50 MG tablet Take 1 tablet (50 mg total) by mouth at bedtime. 11/20/13 11/20/14  Levonne Spiller, MD   BP 140/92 mmHg  Pulse 97  Temp(Src) 98.8 F (37.1 C)  Resp 20  Ht 5' (1.524 m)  Wt 147 lb (66.679 kg)  BMI 28.71 kg/m2  SpO2 100%  LMP 03/30/2014 Physical Exam  Constitutional: She is oriented to person, place, and time. She appears well-developed and well-nourished.  Non-toxic appearance.  HENT:  Head: Normocephalic.  Right Ear: Tympanic membrane and external ear normal.  Left Ear: Tympanic membrane and external ear normal.  Eyes: EOM and lids are normal. Pupils are equal, round, and reactive to light.  Neck: Normal range of motion. Neck supple. Carotid bruit is not present.  Cardiovascular: Normal rate, regular rhythm, normal heart sounds, intact distal pulses and normal pulses.   Pulmonary/Chest: Breath sounds normal. No  respiratory distress. She has no wheezes.  Course breath sounds present, with occasional rhonchi. Symmetrical rise and fall of the chest. Patient slightly at 22 respirations per minute.  Abdominal: Soft. Bowel sounds are normal. There is no tenderness. There is no guarding.  Musculoskeletal: Normal range of motion. She exhibits no edema or tenderness.  Lymphadenopathy:       Head (right side): No submandibular adenopathy present.       Head (left side): No submandibular adenopathy present.    She has no cervical adenopathy.  Neurological: She is alert and oriented to person, place, and time. She has normal strength. No cranial nerve deficit or sensory deficit.  Skin: Skin is warm and dry.  Psychiatric: She has a normal mood and affect. Her speech is normal.  Nursing note and vitals reviewed.   ED Course  Procedures (including critical care time) Labs Review Labs Reviewed - No data to display  Imaging Review Dg Chest 2 View  04/04/2014   CLINICAL DATA:  Productive cough, shortness of breath  EXAM: CHEST  2 VIEW  COMPARISON:  06/29/2013  FINDINGS: Bibasilar, right greater than left, linear pulmonary parenchymal opacities are identified with slightly lower inspiration since previously. Heart size is normal. No pleural effusion. No acute osseous finding.  IMPRESSION: Linear right greater than left lower lobe pulmonary opacities which have a configuration most typical for atelectasis or scarring.   Electronically Signed   By: Conchita Paris M.D.   On: 04/04/2014 14:29     EKG Interpretation None      MDM  Pulse oximetry has been ranging between 96 and 100% on room air. Patient had some tachypnea on my examination, but after nebulizer treatment and cough medication the patient is calm and breathing much easier. Patient speaking in complete sentences without problem.  X-ray of the chest shows a linear right greater than left lobe pulmonary opacity with a configuration that is consistent  with atelectasis or scarring. No other acute findings noted.   The plan at this time is for the patient to be treated with Zithromax, promethazine codeine VC cough medication, albuterol inhaler, and prednisone taper.    Final diagnoses:  Productive cough    *I have reviewed nursing notes, vital signs, and all appropriate lab and imaging results for this patient.[id number] Pryor Montes, PA-C 04/04/14 2111  Shaune Pollack, MD 04/05/14 [phone]

## 2014-04-06 ENCOUNTER — Encounter: Payer: Self-pay | Admitting: Family Medicine

## 2014-04-06 ENCOUNTER — Ambulatory Visit (INDEPENDENT_AMBULATORY_CARE_PROVIDER_SITE_OTHER): Payer: 59 | Admitting: Family Medicine

## 2014-04-06 VITALS — BP 142/90 | HR 98 | Temp 98.6°F | Resp 16 | Ht 60.0 in | Wt 145.1 lb

## 2014-04-06 DIAGNOSIS — I10 Essential (primary) hypertension: Secondary | ICD-10-CM

## 2014-04-06 DIAGNOSIS — J209 Acute bronchitis, unspecified: Secondary | ICD-10-CM | POA: Diagnosis not present

## 2014-04-06 DIAGNOSIS — J4520 Mild intermittent asthma, uncomplicated: Secondary | ICD-10-CM | POA: Diagnosis not present

## 2014-04-06 DIAGNOSIS — J4521 Mild intermittent asthma with (acute) exacerbation: Secondary | ICD-10-CM

## 2014-04-06 MED ORDER — CLINDAMYCIN HCL 300 MG PO CAPS: 300.0000 mg | ORAL_CAPSULE | Freq: Three times a day (TID) | ORAL | Status: DC

## 2014-04-06 MED ORDER — FLUCONAZOLE 150 MG PO TABS: ORAL_TABLET | ORAL | Status: DC

## 2014-04-06 MED ORDER — FLUTICASONE-SALMETEROL 100-50 MCG/DOSE IN AEPB: 1.0000 | INHALATION_SPRAY | Freq: Two times a day (BID) | RESPIRATORY_TRACT | Status: DC

## 2014-04-06 MED ORDER — METHYLPREDNISOLONE ACETATE 80 MG/ML IJ SUSP
80.0000 mg | Freq: Once | INTRAMUSCULAR | Status: AC
  Administered 2014-04-06: 80 mg via INTRAMUSCULAR

## 2014-04-06 MED ORDER — BUDESONIDE-FORMOTEROL FUMARATE 160-4.5 MCG/ACT IN AERO: 2.0000 | INHALATION_SPRAY | Freq: Two times a day (BID) | RESPIRATORY_TRACT | Status: DC

## 2014-04-06 NOTE — Progress Notes (Signed)
   Subjective:    Patient ID: Rebecca Boyd, female    DOB: [birth year], 38 y.o.   MRN: [id number]  HPI Pt seen in ED 2 days prior dx with acute bronchitis, her for f/u stating she continues to cough excessively, and has thick yellow sputum and  And chills Also c/o increased and uncontrolled wheezing Needs work excuse until she recovers  Feels weak   Review of Systems See HPI  Denies sinus pressure, nasal congestion, ear pain or sore throat. Denies chest pains, palpitations and leg swelling Denies abdominal pain, nausea, vomiting,diarrhea or constipation.   Denies dysuria, frequency, hesitancy or incontinence. Denies joint pain, swelling and limitation in mobility. Denies headaches, seizures, numbness, or tingling. Denies depression, anxiety or insomnia. Denies skin break down or rash.        Objective:   Physical Exam BP 142/90 mmHg  Pulse 98  Temp(Src) 98.6 F (37 C) (Oral)  Resp 16  Ht 5' (1.524 m)  Wt 145 lb 1.9 oz (65.826 kg)  BMI 28.34 kg/m2  SpO2 97%  LMP 03/30/2014 Patient alert and oriented and in moderate  cardiopulmonary distress.Ill appearing  HEENT: No facial asymmetry, EOMI,   oropharynx pink and moist.  Neck supple no JVD, no mass.  Chest: Decreased air entry, scattered crackles and wheezes  CVS: S1, S2 no murmurs, no S3.Regular rate.  ABD: Soft non tender.   Ext: No edema  MS: Adequate ROM spine, shoulders, hips and knees.  Skin: Intact, no ulcerations or rash noted.  Psych: Good eye contact, normal affect. Memory intact not anxious or depressed appearing.  CNS: CN 2-12 intact, power,  normal throughout.no focal deficits noted.        Assessment & Plan:  Acute bronchitis Cleocin added , pt to continue prednisone dose pack prescribed , and fluconazole added for expected vaginal candidiasis   Asthma, mild intermittent Pt to start adviar daily for improved symptom control   Essential hypertension, benign Controlled, no change in  medication

## 2014-04-06 NOTE — Telephone Encounter (Signed)
Patient in for ov

## 2014-04-06 NOTE — Patient Instructions (Addendum)
F/u as before  New additional antibiotic cleocin is prescribed for your cough and diflucan if needed for vaginal yeast infection from antibiotic use , continue prednisone as prescribed,New inhaler to use every day to help with wheeze  Work excuse starting from 12/05 to return 12/ 14/2015  You will have chest scan ordered to be done end  December if cough symptoms continue

## 2014-04-17 ENCOUNTER — Other Ambulatory Visit: Payer: Self-pay

## 2014-04-17 MED ORDER — OXYCODONE-ACETAMINOPHEN 5-325 MG PO TABS: 1.0000 | ORAL_TABLET | Freq: Every evening | ORAL | Status: DC | PRN

## 2014-05-04 ENCOUNTER — Telehealth: Payer: Self-pay

## 2014-05-04 ENCOUNTER — Other Ambulatory Visit: Payer: Self-pay

## 2014-05-04 DIAGNOSIS — Z139 Encounter for screening, unspecified: Secondary | ICD-10-CM

## 2014-05-04 DIAGNOSIS — Z113 Encounter for screening for infections with a predominantly sexual mode of transmission: Secondary | ICD-10-CM

## 2014-05-04 DIAGNOSIS — F32A Depression, unspecified: Secondary | ICD-10-CM

## 2014-05-04 DIAGNOSIS — J4520 Mild intermittent asthma, uncomplicated: Secondary | ICD-10-CM

## 2014-05-04 DIAGNOSIS — E559 Vitamin D deficiency, unspecified: Secondary | ICD-10-CM

## 2014-05-04 DIAGNOSIS — N920 Excessive and frequent menstruation with regular cycle: Secondary | ICD-10-CM

## 2014-05-04 DIAGNOSIS — F329 Major depressive disorder, single episode, unspecified: Secondary | ICD-10-CM

## 2014-05-04 DIAGNOSIS — Z1211 Encounter for screening for malignant neoplasm of colon: Secondary | ICD-10-CM

## 2014-05-04 DIAGNOSIS — N771 Vaginitis, vulvitis and vulvovaginitis in diseases classified elsewhere: Secondary | ICD-10-CM

## 2014-05-04 DIAGNOSIS — N760 Acute vaginitis: Secondary | ICD-10-CM

## 2014-05-04 MED ORDER — METRONIDAZOLE 500 MG PO TABS: 500.0000 mg | ORAL_TABLET | Freq: Two times a day (BID) | ORAL | Status: DC

## 2014-05-04 NOTE — Telephone Encounter (Signed)
pls refill flagyll 500 mg script x 1 only, advise her to call for appt i does not fully resolve

## 2014-05-04 NOTE — Telephone Encounter (Signed)
Wants a refill on the BV gel sent to [facility]. Has been having an itch and slight odor x 1 week. Same symptoms as before. Please advise

## 2014-05-05 ENCOUNTER — Other Ambulatory Visit: Payer: Self-pay

## 2014-05-05 ENCOUNTER — Telehealth: Payer: Self-pay

## 2014-05-05 MED ORDER — METRONIDAZOLE 0.75 % VA GEL: 1.0000 | Freq: Two times a day (BID) | VAGINAL | Status: DC

## 2014-05-05 NOTE — Telephone Encounter (Signed)
pls send metrogel intavaginally , I have entered

## 2014-05-05 NOTE — Telephone Encounter (Signed)
Attempted to reach patient.  Phone not in working order.  Med sent to pharmacy

## 2014-05-21 ENCOUNTER — Other Ambulatory Visit: Payer: Self-pay

## 2014-05-21 MED ORDER — OXYCODONE-ACETAMINOPHEN 5-325 MG PO TABS: 1.0000 | ORAL_TABLET | Freq: Every evening | ORAL | Status: DC | PRN

## 2014-05-26 ENCOUNTER — Ambulatory Visit: Payer: 59 | Admitting: Family Medicine

## 2014-06-01 ENCOUNTER — Telehealth: Payer: Self-pay

## 2014-06-01 NOTE — Telephone Encounter (Signed)
Patent wants to know if she can have a work note for today. She has had a bad headache x 1 week and went over to the ER but they were full so she left. She has an appt Thursday and she wanted to know if you would excuse her for missing work today. Please advise

## 2014-06-02 NOTE — Telephone Encounter (Signed)
Note is forwarded to me on 02/02, message initially came in on 02/01 If she is at work today, she can have a note for yesterday. If she is still not at work today 02/02 she needs to be evaluated and treated either at urgent care or in the ED, and I will not write a note to cover 2 days of absence with no treatment being administered

## 2014-06-02 NOTE — Telephone Encounter (Signed)
Patient aware and will pick up excuse.

## 2014-06-04 ENCOUNTER — Encounter: Payer: Self-pay | Admitting: Family Medicine

## 2014-06-04 ENCOUNTER — Other Ambulatory Visit (HOSPITAL_COMMUNITY)
Admission: RE | Admit: 2014-06-04 | Discharge: 2014-06-04 | Disposition: A | Discharge status: Home / Self Care | Payer: 59 | Source: Ambulatory Visit | Attending: Family Medicine | Admitting: Family Medicine

## 2014-06-04 ENCOUNTER — Ambulatory Visit (INDEPENDENT_AMBULATORY_CARE_PROVIDER_SITE_OTHER): Payer: 59 | Admitting: Family Medicine

## 2014-06-04 VITALS — BP 118/82 | HR 74 | Resp 16 | Ht 60.0 in | Wt 145.0 lb

## 2014-06-04 DIAGNOSIS — N760 Acute vaginitis: Secondary | ICD-10-CM | POA: Insufficient documentation

## 2014-06-04 DIAGNOSIS — Z113 Encounter for screening for infections with a predominantly sexual mode of transmission: Secondary | ICD-10-CM | POA: Insufficient documentation

## 2014-06-04 DIAGNOSIS — G43109 Migraine with aura, not intractable, without status migrainosus: Secondary | ICD-10-CM

## 2014-06-04 DIAGNOSIS — I10 Essential (primary) hypertension: Secondary | ICD-10-CM

## 2014-06-04 DIAGNOSIS — F329 Major depressive disorder, single episode, unspecified: Secondary | ICD-10-CM

## 2014-06-04 DIAGNOSIS — M546 Pain in thoracic spine: Secondary | ICD-10-CM

## 2014-06-04 DIAGNOSIS — F32A Depression, unspecified: Secondary | ICD-10-CM

## 2014-06-04 DIAGNOSIS — G43909 Migraine, unspecified, not intractable, without status migrainosus: Secondary | ICD-10-CM

## 2014-06-04 DIAGNOSIS — E559 Vitamin D deficiency, unspecified: Secondary | ICD-10-CM

## 2014-06-04 DIAGNOSIS — D500 Iron deficiency anemia secondary to blood loss (chronic): Secondary | ICD-10-CM

## 2014-06-04 LAB — CBC WITH DIFFERENTIAL/PLATELET
BASOS ABS: 0 10*3/uL (ref 0.0–0.1)
Basophils Relative: 0 % (ref 0–1)
EOS ABS: 0.2 10*3/uL (ref 0.0–0.7)
Eosinophils Relative: 3 % (ref 0–5)
HCT: 43.7 % (ref 36.0–46.0)
Hemoglobin: 14.7 g/dL (ref 12.0–15.0)
Lymphocytes Relative: 22 % (ref 12–46)
Lymphs Abs: 1.2 10*3/uL (ref 0.7–4.0)
MCH: 30.1 pg (ref 26.0–34.0)
MCHC: 33.6 g/dL (ref 30.0–36.0)
MCV: 89.5 fL (ref 78.0–100.0)
MPV: 12.1 fL (ref 8.6–12.4)
Monocytes Absolute: 0.4 10*3/uL (ref 0.1–1.0)
Monocytes Relative: 8 % (ref 3–12)
NEUTROS PCT: 67 % (ref 43–77)
Neutro Abs: 3.6 10*3/uL (ref 1.7–7.7)
PLATELETS: 139 10*3/uL — AB (ref 150–400)
RBC: 4.88 MIL/uL (ref 3.87–5.11)
RDW: 14.2 % (ref 11.5–15.5)
WBC: 5.4 10*3/uL (ref 4.0–10.5)

## 2014-06-04 LAB — FERRITIN: Ferritin: 40 ng/mL (ref 10–291)

## 2014-06-04 LAB — BASIC METABOLIC PANEL
BUN: 8 mg/dL (ref 6–23)
CALCIUM: 9.6 mg/dL (ref 8.4–10.5)
CHLORIDE: 107 meq/L (ref 96–112)
CO2: 25 meq/L (ref 19–32)
Creat: 0.75 mg/dL (ref 0.50–1.10)
Glucose, Bld: 82 mg/dL (ref 70–99)
POTASSIUM: 4.2 meq/L (ref 3.5–5.3)
SODIUM: 138 meq/L (ref 135–145)

## 2014-06-04 LAB — TSH: TSH: 1.652 u[IU]/mL (ref 0.350–4.500)

## 2014-06-04 LAB — IRON: IRON: 215 ug/dL — AB (ref 42–145)

## 2014-06-04 MED ORDER — SUMATRIPTAN SUCCINATE 100 MG PO TABS: ORAL_TABLET | ORAL | Status: DC

## 2014-06-04 MED ORDER — ALPRAZOLAM 1 MG PO TABS: 1.0000 mg | ORAL_TABLET | Freq: Every day | ORAL | Status: DC

## 2014-06-04 MED ORDER — TOPIRAMATE 50 MG PO TABS: 50.0000 mg | ORAL_TABLET | Freq: Two times a day (BID) | ORAL | Status: DC

## 2014-06-04 NOTE — Progress Notes (Signed)
   Subjective:    Patient ID: Rebecca Boyd, female    DOB: 1975-06-14, 39 y.o.   MRN: [id number]  HPI The PT is here for follow up and re-evaluation of chronic medical conditions, medication management and review of any available recent lab and radiology data.  Preventive health is updated, specifically  Cancer screening and Immunization.    2 month h/o daily pounding left sided headache to crown associated with photo and phonophobia, relieved by rest Increased mid back pain wants epidural 6 day h/o malodorous vaginal d/c       Review of Systems See HPI Denies recent fever or chills. Denies sinus pressure, nasal congestion, ear pain or sore throat. Denies chest congestion, productive cough or wheezing. Denies chest pains, palpitations and leg swelling Denies abdominal pain, nausea, vomiting,diarrhea or constipation.   Denies dysuria, frequency, hesitancy or incontinence. Denies  seizures, numbness, or tingling. Denies depression, anxiety or insomnia. Denies skin break down or rash.        Objective:   Physical Exam  BP 118/82 mmHg  Pulse 74  Resp 16  Ht 5' (1.524 m)  Wt 145 lb (65.772 kg)  BMI 28.32 kg/m2  SpO2 100% Patient alert and oriented and in no cardiopulmonary distress.  HEENT: No facial asymmetry, EOMI,   oropharynx pink and moist.  Neck supple no JVD, no mass.  Chest: Clear to auscultation bilaterally.  CVS: S1, S2 no murmurs, no S3.Regular rate.  ABD: Soft non tender. No organomegaly or mass , bowel sounds Pelvic: Malodorous vaginal discharge, cervical and adnexal tenderness  Ext: No edema  MS: Adequate ROM spine, shoulders, hips and knees.  Skin: Intact, no ulcerations or rash noted.  Psych: Good eye contact, normal affect. Memory intact not anxious or depressed appearing.  CNS: CN 2-12 intact, power,  normal throughout.no focal deficits noted.       Assessment & Plan:  Thoracic back pain Uncontrolled has benefitied in the past from  epidural   Vaginitis and vulvovaginitis Specimens sent , will treat when results are avaialable   Vitamin D deficiency Updated lab needed     Iron deficiency anemia due to chronic blood loss Updated lab needed     Depression Controlled , treated by psychiatry   Migraine headache Classic  migraine history, normal neurologic exam. Pt to start prophylactic as well as abortive medication, educated re need to start headache diary to avoid  As much as possible any triggers     Essential hypertension, benign Controlled, no change in medication DASH diet and commitment to daily physical activity for a minimum of 30 minutes discussed and encouraged, as a part of hypertension management. The importance of attaining a healthy weight is also discussed.

## 2014-06-04 NOTE — Assessment & Plan Note (Signed)
Uncontrolled has benefitied in the past from epidural

## 2014-06-04 NOTE — Patient Instructions (Addendum)
F/u in 3 month, call if you need me before  Labs needed CBc, iron and ferritin, TSH and vit D and chem 7 today if possible   You will be contacted re the results of swabs  Headache is classic for migraine, start medication daily, topamax, and use immitrex only twice weekly if needed   You are referred for epidural

## 2014-06-05 ENCOUNTER — Other Ambulatory Visit: Payer: Self-pay | Admitting: Family Medicine

## 2014-06-05 LAB — VITAMIN D 25 HYDROXY (VIT D DEFICIENCY, FRACTURES): VIT D 25 HYDROXY: 9 ng/mL — AB (ref 30–100)

## 2014-06-05 LAB — CERVICOVAGINAL ANCILLARY ONLY
CHLAMYDIA, DNA PROBE: NEGATIVE
Neisseria Gonorrhea: NEGATIVE

## 2014-06-06 NOTE — Assessment & Plan Note (Signed)
Updated lab needed.  

## 2014-06-06 NOTE — Assessment & Plan Note (Signed)
Specimens sent , will treat when results are avaialable

## 2014-06-06 NOTE — Assessment & Plan Note (Signed)
Controlled , treated by psychiatry

## 2014-06-06 NOTE — Assessment & Plan Note (Signed)
Controlled, no change in medication DASH diet and commitment to daily physical activity for a minimum of 30 minutes discussed and encouraged, as a part of hypertension management. The importance of attaining a healthy weight is also discussed.  

## 2014-06-06 NOTE — Assessment & Plan Note (Signed)
Classic  migraine history, normal neurologic exam. Pt to start prophylactic as well as abortive medication, educated re need to start headache diary to avoid  As much as possible any triggers

## 2014-06-08 ENCOUNTER — Other Ambulatory Visit: Payer: Self-pay

## 2014-06-08 ENCOUNTER — Telehealth: Payer: Self-pay | Admitting: Family Medicine

## 2014-06-08 ENCOUNTER — Other Ambulatory Visit: Payer: Self-pay | Admitting: Family Medicine

## 2014-06-08 LAB — CERVICOVAGINAL ANCILLARY ONLY
WET PREP (BD AFFIRM): NEGATIVE
WET PREP (BD AFFIRM): NEGATIVE
WET PREP (BD AFFIRM): POSITIVE — AB

## 2014-06-08 MED ORDER — VITAMIN D (ERGOCALCIFEROL) 1.25 MG (50000 UNIT) PO CAPS
50000.0000 [IU] | ORAL_CAPSULE | ORAL | Status: DC
Start: 2014-06-08 — End: 2014-11-25

## 2014-06-08 MED ORDER — METRONIDAZOLE 500 MG PO TABS: 500.0000 mg | ORAL_TABLET | Freq: Two times a day (BID) | ORAL | Status: DC

## 2014-06-08 NOTE — Telephone Encounter (Signed)
PLS NOTE. She has BV , NOT trichomonas, initial msg is incorrect Needs flagyll for 1 week I am re entering the correct med Report on blood tests is accurate, pls let her know her results

## 2014-06-08 NOTE — Telephone Encounter (Signed)
Pt AWARE 

## 2014-06-12 ENCOUNTER — Other Ambulatory Visit: Payer: Self-pay | Admitting: Family Medicine

## 2014-06-12 DIAGNOSIS — M549 Dorsalgia, unspecified: Secondary | ICD-10-CM

## 2014-06-16 ENCOUNTER — Ambulatory Visit: Payer: 59 | Admitting: Family Medicine

## 2014-06-16 ENCOUNTER — Telehealth: Payer: Self-pay | Admitting: *Deleted

## 2014-06-16 ENCOUNTER — Inpatient Hospital Stay: Admission: RE | Admit: 2014-06-16 | Payer: Self-pay | Source: Ambulatory Visit

## 2014-06-16 NOTE — Telephone Encounter (Signed)
Pt called requesting to speak with Loma Sousa, Pt is requesting Loma Sousa to call her back. Please advise [phone]

## 2014-06-16 NOTE — Telephone Encounter (Signed)
Spoke with patient and concern addressed.

## 2014-06-19 ENCOUNTER — Other Ambulatory Visit: Payer: Self-pay

## 2014-06-19 MED ORDER — OXYCODONE-ACETAMINOPHEN 5-325 MG PO TABS: 1.0000 | ORAL_TABLET | Freq: Every evening | ORAL | Status: DC | PRN

## 2014-06-25 ENCOUNTER — Telehealth: Payer: Self-pay | Admitting: *Deleted

## 2014-06-25 NOTE — Telephone Encounter (Signed)
Patient aware that she would need to have a nebulizer in order to get that documentation.

## 2014-06-25 NOTE — Telephone Encounter (Signed)
Pt called stating she needs something for social services stating she has asthma so they will help her pay her light bill. Please advise

## 2014-07-17 ENCOUNTER — Other Ambulatory Visit: Payer: Self-pay

## 2014-07-17 MED ORDER — OXYCODONE-ACETAMINOPHEN 5-325 MG PO TABS: 1.0000 | ORAL_TABLET | Freq: Every evening | ORAL | Status: DC | PRN

## 2014-07-21 ENCOUNTER — Other Ambulatory Visit (HOSPITAL_COMMUNITY)
Admission: RE | Admit: 2014-07-21 | Discharge: 2014-07-21 | Disposition: A | Discharge status: Home / Self Care | Payer: 59 | Source: Ambulatory Visit | Attending: Family Medicine | Admitting: Family Medicine

## 2014-07-21 ENCOUNTER — Encounter: Payer: Self-pay | Admitting: Family Medicine

## 2014-07-21 ENCOUNTER — Ambulatory Visit (INDEPENDENT_AMBULATORY_CARE_PROVIDER_SITE_OTHER): Payer: 59 | Admitting: Family Medicine

## 2014-07-21 VITALS — BP 132/84 | HR 86 | Resp 18 | Ht 60.0 in | Wt 144.0 lb

## 2014-07-21 DIAGNOSIS — M545 Low back pain: Secondary | ICD-10-CM | POA: Diagnosis not present

## 2014-07-21 DIAGNOSIS — N760 Acute vaginitis: Secondary | ICD-10-CM | POA: Diagnosis not present

## 2014-07-21 DIAGNOSIS — A499 Bacterial infection, unspecified: Secondary | ICD-10-CM

## 2014-07-21 DIAGNOSIS — M5440 Lumbago with sciatica, unspecified side: Secondary | ICD-10-CM

## 2014-07-21 DIAGNOSIS — F329 Major depressive disorder, single episode, unspecified: Secondary | ICD-10-CM | POA: Diagnosis not present

## 2014-07-21 DIAGNOSIS — Z113 Encounter for screening for infections with a predominantly sexual mode of transmission: Secondary | ICD-10-CM | POA: Diagnosis present

## 2014-07-21 DIAGNOSIS — I10 Essential (primary) hypertension: Secondary | ICD-10-CM

## 2014-07-21 DIAGNOSIS — B9689 Other specified bacterial agents as the cause of diseases classified elsewhere: Secondary | ICD-10-CM

## 2014-07-21 DIAGNOSIS — F17218 Nicotine dependence, cigarettes, with other nicotine-induced disorders: Secondary | ICD-10-CM

## 2014-07-21 DIAGNOSIS — F32A Depression, unspecified: Secondary | ICD-10-CM

## 2014-07-21 DIAGNOSIS — G43009 Migraine without aura, not intractable, without status migrainosus: Secondary | ICD-10-CM

## 2014-07-21 DIAGNOSIS — J4520 Mild intermittent asthma, uncomplicated: Secondary | ICD-10-CM

## 2014-07-21 MED ORDER — KETOROLAC TROMETHAMINE 60 MG/2ML IM SOLN
60.0000 mg | Freq: Once | INTRAMUSCULAR | Status: AC
Start: 2014-07-21 — End: 2014-07-21
  Administered 2014-07-21: 60 mg via INTRAMUSCULAR

## 2014-07-21 NOTE — Patient Instructions (Addendum)
F/u in 4 month, call if you need  Me before  Toradol for back pain and start taking topamax  Two tabs daily, separate or at bedtime only, this will help chronic pain  You will get treated for chronic/ recurrent BV when results are back as discussed  It is important that you exercise regularly at least 30 minutes 5 times a week. If you develop chest pain, have severe difficulty breathing, or feel very tired, stop exercising immediately and seek medical attention    Healthy weight for you is 120 pounds  Pls rethink ongoing decision to continue nicotine use  Condom use 100%of the time is needed to protect against STD

## 2014-07-23 LAB — CERVICOVAGINAL ANCILLARY ONLY
Chlamydia: NEGATIVE
Neisseria Gonorrhea: NEGATIVE

## 2014-07-24 LAB — CERVICOVAGINAL ANCILLARY ONLY
Wet Prep (BD Affirm): NEGATIVE
Wet Prep (BD Affirm): NEGATIVE
Wet Prep (BD Affirm): POSITIVE — AB

## 2014-07-27 ENCOUNTER — Other Ambulatory Visit: Payer: Self-pay

## 2014-07-27 DIAGNOSIS — N760 Acute vaginitis: Secondary | ICD-10-CM

## 2014-07-27 DIAGNOSIS — B9689 Other specified bacterial agents as the cause of diseases classified elsewhere: Secondary | ICD-10-CM

## 2014-07-27 HISTORY — DX: Other specified bacterial agents as the cause of diseases classified elsewhere: B96.89

## 2014-07-27 HISTORY — DX: Acute vaginitis: N76.0

## 2014-07-27 MED ORDER — ALBUTEROL SULFATE HFA 108 (90 BASE) MCG/ACT IN AERS: 2.0000 | INHALATION_SPRAY | RESPIRATORY_TRACT | Status: DC | PRN

## 2014-07-27 MED ORDER — METRONIDAZOLE 0.75 % VA GEL: 1.0000 | Freq: Two times a day (BID) | VAGINAL | Status: DC

## 2014-07-27 MED ORDER — METRONIDAZOLE 1 % EX GEL: CUTANEOUS | Status: DC

## 2014-07-27 NOTE — Assessment & Plan Note (Signed)
Recurrent, mpore than 3 confirmed episodes in a 12 month period

## 2014-08-14 ENCOUNTER — Other Ambulatory Visit: Payer: Self-pay

## 2014-08-14 MED ORDER — OXYCODONE-ACETAMINOPHEN 5-325 MG PO TABS: 1.0000 | ORAL_TABLET | Freq: Every evening | ORAL | Status: DC | PRN

## 2014-08-27 ENCOUNTER — Other Ambulatory Visit: Payer: Self-pay

## 2014-08-27 ENCOUNTER — Telehealth: Payer: Self-pay | Admitting: Family Medicine

## 2014-08-27 MED ORDER — METRONIDAZOLE 500 MG PO TABS: 500.0000 mg | ORAL_TABLET | Freq: Two times a day (BID) | ORAL | Status: DC

## 2014-08-27 NOTE — Telephone Encounter (Signed)
She is talking about BOTH rx's for metrogel. One was for current infection and the other was for maintenance therapy. I sent the correct dose of flagyl for the current treatment but need to know the maintenance dosing for the tabs

## 2014-08-27 NOTE — Telephone Encounter (Signed)
If she is talking about flagyll Please l. Please send in metronidazole 500mg  one twice daily #14 only,  I DO NOT KNOW what medication this msg is about! If something different let me know pls

## 2014-08-28 NOTE — Telephone Encounter (Signed)
Pls let her know that the GEL is what is recommended for suppression , no pill recommended

## 2014-08-30 DIAGNOSIS — F17200 Nicotine dependence, unspecified, uncomplicated: Secondary | ICD-10-CM | POA: Insufficient documentation

## 2014-08-30 DIAGNOSIS — M549 Dorsalgia, unspecified: Secondary | ICD-10-CM | POA: Insufficient documentation

## 2014-08-30 DIAGNOSIS — J209 Acute bronchitis, unspecified: Secondary | ICD-10-CM | POA: Insufficient documentation

## 2014-08-30 NOTE — Assessment & Plan Note (Signed)
Pt to start daily topamax to reduc headache frequency also encouraged to start a headache diary

## 2014-08-30 NOTE — Assessment & Plan Note (Signed)
Uncontrolled pain, toradol in office , add topamax to assist with pain control, also continue once daily pain med

## 2014-08-30 NOTE — Assessment & Plan Note (Signed)
Specimens sent for testingf , will treat based on result, importance of condom use is discussed

## 2014-08-30 NOTE — Assessment & Plan Note (Signed)
Controlled, no change in medication DASH diet and commitment to daily physical activity for a minimum of 30 minutes discussed and encouraged, as a part of hypertension management. The importance of attaining a healthy weight is also discussed.  BP/Weight 07/21/2014 06/04/2014 04/06/2014 04/04/2014 01/28/2014 01/07/2014 9/35/7017  Systolic BP 793 903 009 233 007 622 633  Diastolic BP 84 82 90 92 80 80 98  Wt. (Lbs) 144.04 145 145.12 147 144 147.8 145  BMI 28.13 28.32 28.34 28.71 28.12 28.87 28.32  Some encounter information is confidential and restricted. Go to Review Flowsheets activity to see all data.

## 2014-08-30 NOTE — Assessment & Plan Note (Signed)
Pt to start adviar daily for improved symptom control

## 2014-08-30 NOTE — Progress Notes (Signed)
Rebecca Boyd     MRN: [id number]      DOB: 1976-02-18   HPI Rebecca Boyd is here for follow up and re-evaluation of chronic medical conditions, medication management and review of any available recent lab and radiology data.  Preventive health is updated, specifically  Cancer screening and Immunization.   Questions or concerns regarding consultations or procedures which the PT has had in the interim are  addressed. The PT denies any adverse reactions to current medications since the last visit.  There are no new concerns.  There are no specific complaints   ROS Denies recent fever or chills. Denies sinus pressure, nasal congestion, ear pain or sore throat. Denies chest congestion, productive cough or wheezing. Denies chest pains, palpitations and leg swelling Denies abdominal pain, nausea, vomiting,diarrhea or constipation.   Denies dysuria, frequency, hesitancy or incontinence. Denies joint pain, swelling and limitation in mobility. Denies headaches, seizures, numbness, or tingling. Denies depression, anxiety or insomnia. Denies skin break down or rash.   PE  BP 132/84 mmHg  Pulse 86  Resp 18  Ht 5' (1.524 m)  Wt 144 lb 0.6 oz (65.336 kg)  BMI 28.13 kg/m2  SpO2 100%  Patient alert and oriented and in no cardiopulmonary distress.  HEENT: No facial asymmetry, EOMI,   oropharynx pink and moist.  Neck supple no JVD, no mass.  Chest: Clear to auscultation bilaterally.  CVS: S1, S2 no murmurs, no S3.Regular rate.  ABD: Soft non tender.  Pelvic: fishy smelling grey d/c , uterus not enlarged, no cervical motion or adnexal renderness Ext: No edema  MS: decreased ROM lumbar  Spine, adequate in  shoulders, hips and knees.  Skin: Intact, no ulcerations or rash noted.  Psych: Good eye contact, normal affect. Memory intact not anxious or depressed appearing.  CNS: CN 2-12 intact, power,  normal throughout.no focal deficits noted.   Assessment & Plan   BV (bacterial  vaginosis) Recurrent, mpore than 3 confirmed episodes in a 12 month period   Low back pain Uncontrolled pain, toradol in office , add topamax to assist with pain control, also continue once daily pain med   Depression Controlled and stable on current meds, managed by psych and needs to schedule and keep f/u appt   Vaginitis and vulvovaginitis Specimens sent for testingf , will treat based on result, importance of condom use is discussed   Migraine headache Pt to start daily topamax to reduc headache frequency also encouraged to start a headache diary   Essential hypertension, benign Controlled, no change in medication DASH diet and commitment to daily physical activity for a minimum of 30 minutes discussed and encouraged, as a part of hypertension management. The importance of attaining a healthy weight is also discussed.  BP/Weight 07/21/2014 06/04/2014 04/06/2014 04/04/2014 01/28/2014 01/07/2014 3/82/5053  Systolic BP 976 734 193 790 240 973 532  Diastolic BP 84 82 90 92 80 80 98  Wt. (Lbs) 144.04 145 145.12 147 144 147.8 145  BMI 28.13 28.32 28.34 28.71 28.12 28.87 28.32  Some encounter information is confidential and restricted. Go to Review Flowsheets activity to see all data.         Asthma, mild intermittent Controlled, no change in medication .   Nicotine dependence Patient counseled for approximately 5 minutes regarding the health risks of ongoing nicotine use, specifically all types of cancer, heart disease, stroke and respiratory failure. The options available for help with cessation ,the behavioral changes to assist the process, and the option  to either gradully reduce usage  Or abruptly stop.is also discussed. Pt is also encouraged to set specific goals in number of cigarettes used daily, as well as to set a quit date.  Number of cigarettes/cigars currently smoking daily: 3 -5

## 2014-08-30 NOTE — Assessment & Plan Note (Signed)
Controlled and stable on current meds, managed by psych and needs to schedule and keep f/u appt

## 2014-08-30 NOTE — Assessment & Plan Note (Signed)
Controlled, no change in medication  

## 2014-08-30 NOTE — Assessment & Plan Note (Addendum)
Cleocin added , pt to continue prednisone dose pack prescribed , and fluconazole added for expected vaginal candidiasis

## 2014-08-30 NOTE — Assessment & Plan Note (Signed)

## 2014-09-11 ENCOUNTER — Other Ambulatory Visit: Payer: Self-pay

## 2014-09-11 MED ORDER — OXYCODONE-ACETAMINOPHEN 5-325 MG PO TABS: 1.0000 | ORAL_TABLET | Freq: Every evening | ORAL | Status: DC | PRN

## 2014-09-17 ENCOUNTER — Ambulatory Visit: Payer: 59 | Admitting: Family Medicine

## 2014-09-17 ENCOUNTER — Telehealth: Payer: Self-pay | Admitting: Family Medicine

## 2014-09-17 MED ORDER — ALPRAZOLAM 1 MG PO TABS: 1.0000 mg | ORAL_TABLET | Freq: Every day | ORAL | Status: DC

## 2014-09-17 NOTE — Telephone Encounter (Signed)
rx will be faxed.

## 2014-09-30 ENCOUNTER — Encounter: Payer: Self-pay | Admitting: Family Medicine

## 2014-09-30 ENCOUNTER — Ambulatory Visit: Payer: 59 | Admitting: Family Medicine

## 2014-10-01 ENCOUNTER — Other Ambulatory Visit: Payer: Self-pay

## 2014-10-01 MED ORDER — OXYCODONE-ACETAMINOPHEN 5-325 MG PO TABS: 1.0000 | ORAL_TABLET | Freq: Every evening | ORAL | Status: DC | PRN

## 2014-10-09 ENCOUNTER — Emergency Department (HOSPITAL_COMMUNITY)
Admission: EM | Admit: 2014-10-09 | Discharge: 2014-10-09 | Disposition: A | Discharge status: Home / Self Care | Payer: 59 | Attending: Emergency Medicine | Admitting: Emergency Medicine

## 2014-10-09 ENCOUNTER — Encounter (HOSPITAL_COMMUNITY): Payer: Self-pay | Admitting: *Deleted

## 2014-10-09 DIAGNOSIS — F329 Major depressive disorder, single episode, unspecified: Secondary | ICD-10-CM | POA: Insufficient documentation

## 2014-10-09 DIAGNOSIS — G43109 Migraine with aura, not intractable, without status migrainosus: Secondary | ICD-10-CM | POA: Insufficient documentation

## 2014-10-09 DIAGNOSIS — Z862 Personal history of diseases of the blood and blood-forming organs and certain disorders involving the immune mechanism: Secondary | ICD-10-CM | POA: Insufficient documentation

## 2014-10-09 DIAGNOSIS — Z8742 Personal history of other diseases of the female genital tract: Secondary | ICD-10-CM | POA: Insufficient documentation

## 2014-10-09 DIAGNOSIS — F419 Anxiety disorder, unspecified: Secondary | ICD-10-CM | POA: Insufficient documentation

## 2014-10-09 DIAGNOSIS — Z79899 Other long term (current) drug therapy: Secondary | ICD-10-CM | POA: Insufficient documentation

## 2014-10-09 DIAGNOSIS — Z91040 Latex allergy status: Secondary | ICD-10-CM | POA: Insufficient documentation

## 2014-10-09 DIAGNOSIS — Z792 Long term (current) use of antibiotics: Secondary | ICD-10-CM | POA: Insufficient documentation

## 2014-10-09 DIAGNOSIS — Z720 Tobacco use: Secondary | ICD-10-CM | POA: Insufficient documentation

## 2014-10-09 DIAGNOSIS — Z7951 Long term (current) use of inhaled steroids: Secondary | ICD-10-CM | POA: Insufficient documentation

## 2014-10-09 MED ORDER — BUTALBITAL-APAP-CAFFEINE 50-325-40 MG PO TABS: 1.0000 | ORAL_TABLET | ORAL | Status: DC | PRN

## 2014-10-09 MED ORDER — METOCLOPRAMIDE HCL 5 MG/ML IJ SOLN
10.0000 mg | Freq: Once | INTRAMUSCULAR | Status: AC
  Administered 2014-10-09: 10 mg via INTRAMUSCULAR
  Filled 2014-10-09: qty 2

## 2014-10-09 MED ORDER — KETOROLAC TROMETHAMINE 60 MG/2ML IM SOLN
60.0000 mg | Freq: Once | INTRAMUSCULAR | Status: AC
Start: 2014-10-09 — End: 2014-10-09
  Administered 2014-10-09: 60 mg via INTRAMUSCULAR
  Filled 2014-10-09: qty 2

## 2014-10-09 MED ORDER — HYDROCODONE-ACETAMINOPHEN 5-325 MG PO TABS
1.0000 | ORAL_TABLET | Freq: Once | ORAL | Status: AC
Start: 2014-10-09 — End: 2014-10-09
  Administered 2014-10-09: 1 via ORAL
  Filled 2014-10-09: qty 1

## 2014-10-09 NOTE — Discharge Instructions (Signed)

## 2014-10-09 NOTE — ED Notes (Signed)
Headache onset yesterday

## 2014-10-09 NOTE — ED Provider Notes (Signed)
CSN: [id number]     Arrival date & time 10/09/14  1359 History   First MD Initiated Contact with Patient 10/09/14 1609     Chief Complaint  Patient presents with  . Headache     (Consider location/radiation/quality/duration/timing/severity/associated sxs/prior Treatment) HPI   Rebecca Boyd is a 39 y.o. female who presents to the Emergency Department complaining of gradual onset of diffuse frontal headache for one day.  She reports history of migraine headaches and pain today feels similar to previous.  She describes the pain as throbbing and focused to the top of her head and behind both eyes. She also reports sensitivity to light and sound. She took ibuprofen earlier today without relief. She denies recent illness, fever, visual changes, vomiting , neck pain or stiffness.   Past Medical History  Diagnosis Date  . Depressive disorder, not elsewhere classified   . Neck pain   . Vaginitis     cyctitis  . Miscarriage 2009  . Anemia   . Anxiety   . Migraine    Past Surgical History  Procedure Laterality Date  . Cervical cryotherapy N/A 1999  . Laparoscopic bilateral salpingectomy Bilateral 08/26/2013    Procedure: LAPAROSCOPIC BILATERAL SALPINGECTOMY AND REMOVAL OF LEFT PERITUBAL CYST Procedure #1;  Surgeon: Jonnie Kind, MD;  Location: [facility];  Service: Gynecology;  Laterality: Bilateral;  . Dilitation & currettage/hystroscopy with thermachoice ablation  08/26/2013    Procedure: DILATATION & CURETTAGE/HYSTEROSCOPY WITH THERMACHOICE ENDOMETRIAL ABLATION Procedure #2 Total Therapy Time=min       sec;  Surgeon: Jonnie Kind, MD;  Location: [facility];  Service: Gynecology;;  . Vulvar lesion removal  08/26/2013    Procedure: REMOVAL OF VULVAR SEBACEOUS CYST Procedure #3;  Surgeon: Jonnie Kind, MD;  Location: [facility];  Service: Gynecology;;  . Laparoscopic lysis of adhesions  08/26/2013    Procedure: LAPAROSCOPIC LYSIS OF ADHESIONS Procedure #1;  Surgeon: Jonnie Kind, MD;   Location: [facility];  Service: Gynecology;;   Family History  Problem Relation Age of Onset  . Drug abuse Mother   . Hypertension Father   . Hypertension Maternal Grandmother   . Hypertension Maternal Grandfather   . Hypertension Paternal Grandmother    History  Substance Use Topics  . Smoking status: Light Tobacco Smoker -- 0.25 packs/day    Types: Cigars  . Smokeless tobacco: Never Used  . Alcohol Use: Yes     Comment: occasional   OB History    No data available     Review of Systems  Constitutional: Negative for fever, activity change and appetite change.  HENT: Negative for facial swelling and trouble swallowing.   Eyes: Positive for photophobia. Negative for pain and visual disturbance.  Respiratory: Negative for shortness of breath.   Cardiovascular: Negative for chest pain.  Gastrointestinal: Negative for nausea and vomiting.  Musculoskeletal: Negative for neck pain and neck stiffness.  Skin: Negative for rash and wound.  Neurological: Positive for headaches. Negative for dizziness, seizures, syncope, facial asymmetry, speech difficulty, weakness and numbness.  Psychiatric/Behavioral: Negative for confusion and decreased concentration.  All other systems reviewed and are negative.     Allergies  Diphenhydramine hcl; Orange fruit; and Latex  Home Medications   Prior to Admission medications   Medication Sig Start Date End Date Taking? Authorizing Provider  albuterol (PROVENTIL HFA;VENTOLIN HFA) 108 (90 BASE) MCG/ACT inhaler Inhale 2 puffs into the lungs every 4 (four) hours as needed for wheezing or shortness of breath. 07/27/14  Fayrene Helper, MD  ALPRAZolam Duanne Moron) 1 MG tablet Take 1 tablet (1 mg total) by mouth at bedtime. 09/17/14   Fayrene Helper, MD  amLODipine (NORVASC) 2.5 MG tablet Take 1 tablet (2.5 mg total) by mouth daily. 01/07/14   Fayrene Helper, MD  butalbital-acetaminophen-caffeine (FIORICET) [phone] MG per tablet Take 1 tablet by mouth  every 4 (four) hours as needed for headache. 10/09/14 10/09/15  Andru Genter, PA-C  FLUoxetine (PROZAC) 20 MG capsule Take 1 capsule (20 mg total) by mouth daily. 11/20/13 11/20/14  Cloria Spring, MD  Fluticasone-Salmeterol (ADVAIR) 100-50 MCG/DOSE AEPB Inhale 1 puff into the lungs 2 (two) times daily. 04/06/14   Fayrene Helper, MD  metroNIDAZOLE (FLAGYL) 500 MG tablet Take 1 tablet (500 mg total) by mouth 2 (two) times daily. 08/27/14   Fayrene Helper, MD  metroNIDAZOLE (METROGEL) 1 % gel Apply topically 2 (two) times a week. One applicator full two times weekly , for 6 months 08/03/14   Fayrene Helper, MD  oxyCODONE-acetaminophen (PERCOCET/ROXICET) 5-325 MG per tablet Take 1 tablet by mouth at bedtime as needed for moderate pain or severe pain. One tablet at bedtime for back pain 10/01/14   Fayrene Helper, MD  QUEtiapine (SEROQUEL) 50 MG tablet Take 1 tablet (50 mg total) by mouth at bedtime. 11/20/13 11/20/14  Cloria Spring, MD  SUMAtriptan (IMITREX) 100 MG tablet One tablet at headache onset, may repaeat once after 24 hours if headache persists. Maximum of 2 tablets in 24 hour period 06/04/14   Fayrene Helper, MD  topiramate (TOPAMAX) 50 MG tablet Take 1 tablet (50 mg total) by mouth 2 (two) times daily. 06/04/14   Fayrene Helper, MD  Vitamin D, Ergocalciferol, (DRISDOL) 50000 UNITS CAPS capsule Take 1 capsule (50,000 Units total) by mouth every 7 (seven) days. 06/08/14   Fayrene Helper, MD   BP 152/85 mmHg  Pulse 60  Temp(Src) 98.2 F (36.8 C) (Oral)  Resp 18  Ht 5' (1.524 m)  Wt 144 lb 14.4 oz (65.726 kg)  BMI 28.30 kg/m2  SpO2 100%  LMP 10/09/2014 Physical Exam  Constitutional: She is oriented to person, place, and time. She appears well-developed and well-nourished. No distress.  HENT:  Head: Normocephalic and atraumatic.  Mouth/Throat: Oropharynx is clear and moist.  Eyes: EOM are normal. Pupils are equal, round, and reactive to light.  Neck: Normal range of motion  and phonation normal. Neck supple. No spinous process tenderness and no muscular tenderness present. No rigidity. No Kernig's sign noted.  Cardiovascular: Normal rate, regular rhythm, normal heart sounds and intact distal pulses.   No murmur heard. Pulmonary/Chest: Effort normal and breath sounds normal. No respiratory distress.  Musculoskeletal: Normal range of motion.  Neurological: She is alert and oriented to person, place, and time. She has normal strength. No cranial nerve deficit or sensory deficit. She exhibits normal muscle tone. Coordination and gait normal. GCS eye subscore is 4. GCS verbal subscore is 5. GCS motor subscore is 6.  Reflex Scores:      Tricep reflexes are 2+ on the right side and 2+ on the left side.      Bicep reflexes are 2+ on the right side and 2+ on the left side. Skin: Skin is warm and dry.  Psychiatric: She has a normal mood and affect.  Nursing note and vitals reviewed.   ED Course  Procedures (including critical care time) Labs Review Labs Reviewed - No data to display  Imaging Review No results found.   EKG Interpretation None      MDM   Final diagnoses:  Migraine with aura and without status migrainosus, not intractable    1710  patient is feeling better. Vital signs are stable. No meningeal signs. Headache similar to previous. Patient agrees to follow-up with her primary care doctor if needed.    Kem Parkinson, PA-C 10/09/14 1716  Milton Ferguson, MD 10/10/14 1409

## 2014-10-09 NOTE — ED Notes (Signed)
Headache, since yesterday , No HI. Alert, No vomitng.

## 2014-10-31 ENCOUNTER — Emergency Department (HOSPITAL_COMMUNITY)
Admission: EM | Admit: 2014-10-31 | Discharge: 2014-10-31 | Disposition: A | Discharge status: Home / Self Care | Payer: 59 | Attending: Emergency Medicine | Admitting: Emergency Medicine

## 2014-10-31 ENCOUNTER — Encounter (HOSPITAL_COMMUNITY): Payer: Self-pay | Admitting: Emergency Medicine

## 2014-10-31 DIAGNOSIS — F329 Major depressive disorder, single episode, unspecified: Secondary | ICD-10-CM | POA: Insufficient documentation

## 2014-10-31 DIAGNOSIS — Z139 Encounter for screening, unspecified: Secondary | ICD-10-CM

## 2014-10-31 DIAGNOSIS — Z79899 Other long term (current) drug therapy: Secondary | ICD-10-CM | POA: Insufficient documentation

## 2014-10-31 DIAGNOSIS — Z0000 Encounter for general adult medical examination without abnormal findings: Secondary | ICD-10-CM | POA: Insufficient documentation

## 2014-10-31 DIAGNOSIS — Z720 Tobacco use: Secondary | ICD-10-CM | POA: Insufficient documentation

## 2014-10-31 DIAGNOSIS — G43909 Migraine, unspecified, not intractable, without status migrainosus: Secondary | ICD-10-CM | POA: Insufficient documentation

## 2014-10-31 DIAGNOSIS — F419 Anxiety disorder, unspecified: Secondary | ICD-10-CM | POA: Insufficient documentation

## 2014-10-31 DIAGNOSIS — Z91040 Latex allergy status: Secondary | ICD-10-CM | POA: Insufficient documentation

## 2014-10-31 DIAGNOSIS — Z792 Long term (current) use of antibiotics: Secondary | ICD-10-CM | POA: Insufficient documentation

## 2014-10-31 DIAGNOSIS — Z8742 Personal history of other diseases of the female genital tract: Secondary | ICD-10-CM | POA: Insufficient documentation

## 2014-10-31 DIAGNOSIS — Z862 Personal history of diseases of the blood and blood-forming organs and certain disorders involving the immune mechanism: Secondary | ICD-10-CM | POA: Insufficient documentation

## 2014-10-31 NOTE — ED Notes (Signed)
Pt reports a bug "flew" in my ear prior to arrival. Pt denies any change in hearing or pain.

## 2014-11-02 NOTE — ED Provider Notes (Signed)
CSN: [id number]     Arrival date & time 10/31/14  1317 History   First MD Initiated Contact with Patient 10/31/14 1406     Chief Complaint  Patient presents with  . Insect Bite     (Consider location/radiation/quality/duration/timing/severity/associated sxs/prior Treatment) HPI   Rebecca Boyd is a 39 y.o. female who presents to the Emergency Department complaining of foreign body sensation to her right ear canal.  states that she felt a "bug" fly in her ear just prior to arrival.  She states the insect may have flew out, but she comes to ER requesting to "have it checked out."  She denies bleeding, decreased hearing, dizziness or headache.     Past Medical History  Diagnosis Date  . Depressive disorder, not elsewhere classified   . Neck pain   . Vaginitis     cyctitis  . Miscarriage 2009  . Anemia   . Anxiety   . Migraine    Past Surgical History  Procedure Laterality Date  . Cervical cryotherapy N/A 1999  . Laparoscopic bilateral salpingectomy Bilateral 08/26/2013    Procedure: LAPAROSCOPIC BILATERAL SALPINGECTOMY AND REMOVAL OF LEFT PERITUBAL CYST Procedure #1;  Surgeon: Jonnie Kind, MD;  Location: [facility];  Service: Gynecology;  Laterality: Bilateral;  . Dilitation & currettage/hystroscopy with thermachoice ablation  08/26/2013    Procedure: DILATATION & CURETTAGE/HYSTEROSCOPY WITH THERMACHOICE ENDOMETRIAL ABLATION Procedure #2 Total Therapy Time=min       sec;  Surgeon: Jonnie Kind, MD;  Location: [facility];  Service: Gynecology;;  . Vulvar lesion removal  08/26/2013    Procedure: REMOVAL OF VULVAR SEBACEOUS CYST Procedure #3;  Surgeon: Jonnie Kind, MD;  Location: [facility];  Service: Gynecology;;  . Laparoscopic lysis of adhesions  08/26/2013    Procedure: LAPAROSCOPIC LYSIS OF ADHESIONS Procedure #1;  Surgeon: Jonnie Kind, MD;  Location: [facility];  Service: Gynecology;;   Family History  Problem Relation Age of Onset  . Drug abuse Mother   . Hypertension Father    . Hypertension Maternal Grandmother   . Hypertension Maternal Grandfather   . Hypertension Paternal Grandmother    History  Substance Use Topics  . Smoking status: Light Tobacco Smoker -- 0.25 packs/day    Types: Cigars  . Smokeless tobacco: Never Used  . Alcohol Use: Yes     Comment: occasional   OB History    No data available     Review of Systems  Constitutional: Negative for fever.  HENT: Positive for ear pain. Negative for congestion, facial swelling and trouble swallowing.   Neurological: Negative for dizziness and headaches.  All other systems reviewed and are negative.     Allergies  Diphenhydramine hcl; Orange fruit; and Latex  Home Medications   Prior to Admission medications   Medication Sig Start Date End Date Taking? Authorizing Provider  albuterol (PROVENTIL HFA;VENTOLIN HFA) 108 (90 BASE) MCG/ACT inhaler Inhale 2 puffs into the lungs every 4 (four) hours as needed for wheezing or shortness of breath. 07/27/14  Yes Fayrene Helper, MD  ALPRAZolam Duanne Moron) 1 MG tablet Take 1 tablet (1 mg total) by mouth at bedtime. 09/17/14  Yes Fayrene Helper, MD  amLODipine (NORVASC) 2.5 MG tablet Take 1 tablet (2.5 mg total) by mouth daily. 01/07/14  Yes Fayrene Helper, MD  butalbital-acetaminophen-caffeine (FIORICET) [id number] MG per tablet Take 1 tablet by mouth every 4 (four) hours as needed for headache. 10/09/14 10/09/15 Yes Megahn Killings, PA-C  Fluticasone-Salmeterol (ADVAIR) 100-50 MCG/DOSE AEPB  Inhale 1 puff into the lungs 2 (two) times daily. 04/06/14  Yes Fayrene Helper, MD  ibuprofen (ADVIL,MOTRIN) 200 MG tablet Take 400 mg by mouth every 6 (six) hours as needed for headache.   Yes Historical Provider, MD  oxyCODONE-acetaminophen (PERCOCET/ROXICET) 5-325 MG per tablet Take 1 tablet by mouth at bedtime as needed for moderate pain or severe pain. One tablet at bedtime for back pain 10/01/14  Yes Fayrene Helper, MD  QUEtiapine (SEROQUEL) 50 MG tablet Take 1  tablet (50 mg total) by mouth at bedtime. 11/20/13 11/20/14 Yes Cloria Spring, MD  SUMAtriptan (IMITREX) 100 MG tablet One tablet at headache onset, may repaeat once after 24 hours if headache persists. Maximum of 2 tablets in 24 hour period 06/04/14  Yes Fayrene Helper, MD  FLUoxetine (PROZAC) 20 MG capsule Take 1 capsule (20 mg total) by mouth daily. Patient not taking: Reported on 10/31/2014 11/20/13 11/20/14  Cloria Spring, MD  metroNIDAZOLE (FLAGYL) 500 MG tablet Take 1 tablet (500 mg total) by mouth 2 (two) times daily. Patient not taking: Reported on 10/31/2014 08/27/14   Fayrene Helper, MD  metroNIDAZOLE (METROGEL) 1 % gel Apply topically 2 (two) times a week. One applicator full two times weekly , for 6 months Patient not taking: Reported on 10/31/2014 08/03/14   Fayrene Helper, MD  topiramate (TOPAMAX) 50 MG tablet Take 1 tablet (50 mg total) by mouth 2 (two) times daily. Patient not taking: Reported on 10/31/2014 06/04/14   Fayrene Helper, MD  Vitamin D, Ergocalciferol, (DRISDOL) 50000 UNITS CAPS capsule Take 1 capsule (50,000 Units total) by mouth every 7 (seven) days. Patient not taking: Reported on 10/31/2014 06/08/14   Fayrene Helper, MD   BP 146/98 mmHg  Pulse 93  Temp(Src) 98.3 F (36.8 C) (Oral)  Resp 18  Ht 5' (1.524 m)  Wt 145 lb (65.772 kg)  BMI 28.32 kg/m2  SpO2 100%  LMP 10/09/2014 Physical Exam  Constitutional: She is oriented to person, place, and time. She appears well-developed and well-nourished. No distress.  HENT:  Head: Normocephalic and atraumatic.  Right Ear: Tympanic membrane and ear canal normal. No swelling. No foreign bodies. Tympanic membrane is not perforated.  Left Ear: Tympanic membrane and ear canal normal. No swelling. No foreign bodies. Tympanic membrane is not perforated.  Mouth/Throat: Uvula is midline, oropharynx is clear and moist and mucous membranes are normal.  Cardiovascular: Normal rate and regular rhythm.   Pulmonary/Chest: Effort  normal and breath sounds normal. No respiratory distress.  Musculoskeletal: Normal range of motion.  Neurological: She is alert and oriented to person, place, and time. She exhibits normal muscle tone. Coordination normal.  Skin: Skin is warm and dry.  Psychiatric: She has a normal mood and affect.  Nursing note and vitals reviewed.   ED Course  Procedures (including critical care time) Labs Review Labs Reviewed - No data to display  Imaging Review No results found.   EKG Interpretation None      MDM   Final diagnoses:  Encounter for medical screening examination   Pt is calm, well appearing.  No evidence of foreign body or insect to either ear canal.  TM's intact    Kem Parkinson, PA-C 11/02/14 2118  Daleen Bo, MD 11/03/14 [phone]

## 2014-11-06 ENCOUNTER — Other Ambulatory Visit: Payer: Self-pay

## 2014-11-06 MED ORDER — OXYCODONE-ACETAMINOPHEN 5-325 MG PO TABS: 1.0000 | ORAL_TABLET | Freq: Every evening | ORAL | Status: DC | PRN

## 2014-11-25 ENCOUNTER — Ambulatory Visit (INDEPENDENT_AMBULATORY_CARE_PROVIDER_SITE_OTHER): Payer: 59 | Admitting: Family Medicine

## 2014-11-25 ENCOUNTER — Encounter: Payer: Self-pay | Admitting: Family Medicine

## 2014-11-25 ENCOUNTER — Other Ambulatory Visit: Payer: Self-pay

## 2014-11-25 ENCOUNTER — Other Ambulatory Visit (HOSPITAL_COMMUNITY)
Admission: RE | Admit: 2014-11-25 | Discharge: 2014-11-25 | Disposition: A | Discharge status: Home / Self Care | Payer: 59 | Source: Ambulatory Visit | Attending: Family Medicine | Admitting: Family Medicine

## 2014-11-25 VITALS — BP 132/96 | HR 88 | Resp 16 | Ht 60.0 in | Wt 146.1 lb

## 2014-11-25 DIAGNOSIS — F17291 Nicotine dependence, other tobacco product, in remission: Secondary | ICD-10-CM

## 2014-11-25 DIAGNOSIS — N760 Acute vaginitis: Secondary | ICD-10-CM | POA: Insufficient documentation

## 2014-11-25 DIAGNOSIS — G43001 Migraine without aura, not intractable, with status migrainosus: Secondary | ICD-10-CM

## 2014-11-25 DIAGNOSIS — M546 Pain in thoracic spine: Secondary | ICD-10-CM

## 2014-11-25 DIAGNOSIS — M79662 Pain in left lower leg: Secondary | ICD-10-CM

## 2014-11-25 DIAGNOSIS — Z113 Encounter for screening for infections with a predominantly sexual mode of transmission: Secondary | ICD-10-CM | POA: Insufficient documentation

## 2014-11-25 DIAGNOSIS — F419 Anxiety disorder, unspecified: Secondary | ICD-10-CM

## 2014-11-25 DIAGNOSIS — J4520 Mild intermittent asthma, uncomplicated: Secondary | ICD-10-CM

## 2014-11-25 DIAGNOSIS — R4689 Other symptoms and signs involving appearance and behavior: Secondary | ICD-10-CM

## 2014-11-25 DIAGNOSIS — I10 Essential (primary) hypertension: Secondary | ICD-10-CM | POA: Diagnosis not present

## 2014-11-25 MED ORDER — AMLODIPINE BESYLATE 2.5 MG PO TABS: 2.5000 mg | ORAL_TABLET | Freq: Every day | ORAL | Status: DC

## 2014-11-25 MED ORDER — VITAMIN D (ERGOCALCIFEROL) 1.25 MG (50000 UNIT) PO CAPS: 50000.0000 [IU] | ORAL_CAPSULE | ORAL | Status: DC

## 2014-11-25 NOTE — Patient Instructions (Addendum)
Nurse BP check in 2 weeks, BRING ALL MEDICATION YOU ARE TAKING, AND TO EVERY VISIT Urine sent today for check for infection  You will be referred for [place] of left calf since you report pain   STOP SMOKING day is today  Take amlodipine 2.5mg  one every day for BP  Continue weekly vitamin D  STOP SMOKE DAY  IS TODAY  MD f/u in 4 month

## 2014-11-25 NOTE — Assessment & Plan Note (Addendum)
Uncontrolled, no med x 2 days, uncertain of what taking as no med  In office, she is to start the prescribed med daily and return in 2 weeks for nurse BP check Importance of med compliance is stressed DASH diet and commitment to daily physical activity for a minimum of 30 minutes discussed and encouraged, as a part of hypertension management. The importance of attaining a healthy weight is also discussed.  BP/Weight 11/25/2014 10/31/2014 10/09/2014 07/21/2014 06/04/2014 04/06/2014 57/12/7280  Systolic BP 060 156 153 794 327 614 709  Diastolic BP 96 98 85 84 82 90 92  Wt. (Lbs) 146.12 145 144.9 144.04 145 145.12 147  BMI 28.54 28.32 28.3 28.13 28.32 28.34 28.71  Some encounter information is confidential and restricted. Go to Review Flowsheets activity to see all data.

## 2014-11-26 ENCOUNTER — Other Ambulatory Visit: Payer: Self-pay | Admitting: Family Medicine

## 2014-11-26 ENCOUNTER — Ambulatory Visit (HOSPITAL_COMMUNITY)
Admission: RE | Admit: 2014-11-26 | Discharge: 2014-11-26 | Disposition: A | Discharge status: Home / Self Care | Payer: 59 | Source: Ambulatory Visit | Attending: Family Medicine | Admitting: Family Medicine

## 2014-11-26 DIAGNOSIS — Z87891 Personal history of nicotine dependence: Secondary | ICD-10-CM | POA: Diagnosis not present

## 2014-11-26 DIAGNOSIS — M79662 Pain in left lower leg: Secondary | ICD-10-CM | POA: Insufficient documentation

## 2014-11-26 LAB — URINE CYTOLOGY ANCILLARY ONLY
Chlamydia: NEGATIVE
Neisseria Gonorrhea: NEGATIVE
TRICH (WINDOWPATH): POSITIVE — AB

## 2014-11-26 NOTE — Progress Notes (Signed)
Subjective:    Patient ID: Rebecca Boyd, female    DOB: 1976-01-21, 39 y.o.   MRN: [id number]  HPI The PT is here for follow up and re-evaluation of chronic medical conditions, medication management and review of any available recent lab and radiology data.  Preventive health is updated, specifically  Cancer screening and Immunization.    The PT denies any adverse reactions to current medications since the last visit.  2 week h/o left calf tenderness, denies cough or hemoptysis or shortness of breath Chronic back pain and anxiety are unchanged    Non compliant with treatment takes amlodipine intermittently, has not filled topamax for her headaches  Review of Systems See HPI Denies recent fever or chills. Denies sinus pressure, nasal congestion, ear pain or sore throat. Denies chest congestion, productive cough or wheezing. Denies chest pains, palpitations and leg swelling Denies abdominal pain, nausea, vomiting,diarrhea or constipation.   Denies dysuria, frequency, hesitancy or incontinence. . Denies uncontrolled  headaches,  Denies depression, anxiety or insomnia. Denies skin break down or rash.        Objective:   Physical Exam BP 132/96 mmHg  Pulse 88  Resp 16  Ht 5' (1.524 m)  Wt 146 lb 1.9 oz (66.28 kg)  BMI 28.54 kg/m2  SpO2 96% Patient alert and oriented and in no cardiopulmonary distress.  HEENT: No facial asymmetry, EOMI,   oropharynx pink and moist.  Neck supple no JVD, no mass.  Chest: Clear to auscultation bilaterally.  CVS: S1, S2 no murmurs, no S3.Regular rate.  ABD: Soft non tender.   Ext: No edema, tender over left posterior calf, no warmth or erythema, homans' negative  MS: Adequate ROM spine, shoulders, hips and knees.  Skin: Intact, no ulcerations or rash noted.  Psych: Good eye contact, normal affect. Memory intact not anxious or depressed appearing.  CNS: CN 2-12 intact, power,  normal throughout.no focal deficits  noted.        Assessment & Plan:  Essential hypertension, benign Uncontrolled, no med x 2 days, uncertain of what taking as no med  In office, she is to start the prescribed med daily and return in 2 weeks for nurse BP check Importance of med compliance is stressed DASH diet and commitment to daily physical activity for a minimum of 30 minutes discussed and encouraged, as a part of hypertension management. The importance of attaining a healthy weight is also discussed.  BP/Weight 11/25/2014 10/31/2014 10/09/2014 07/21/2014 06/04/2014 04/06/2014 40/12/8117  Systolic BP 147 829 562 130 865 784 696  Diastolic BP 96 98 85 84 82 90 92  Wt. (Lbs) 146.12 145 144.9 144.04 145 145.12 147  BMI 28.54 28.32 28.3 28.13 28.32 28.34 28.71  Some encounter information is confidential and restricted. Go to Review Flowsheets activity to see all data.        Vaginitis and vulvovaginitis Recurrent vaginitis sym[ptoms, urine sent for testing and will treat based on result  Nicotine dependence Patient counseled for approximately 5 minutes regarding the health risks of ongoing nicotine use, specifically all types of cancer, heart disease, stroke and respiratory failure. The options available for help with cessation ,the behavioral changes to assist the process, and the option to either gradully reduce usage  Or abruptly stop.is also discussed. Pt is also encouraged to set specific goals in number of cigarettes used daily, as well as to set a quit date.  Number of cigarettes/cigars currently smoking daily: 2 Quit date is day of visit   Pain  of left calf 2 week h/o left calf pain, very low sus[picion for DVT, likley neuropathic pain, will r/o clot with [place] of venous sytem  Anxiety Controlled, no change in medication   Back pain Chronic , no change in management back pain unchanged, will attempt to change to gabaptin,, will discuss at next visit   Asthma, mild intermittent Stable intermittent and  infrequent need for rescue inhaler  Migraine headache denmies significant recurrence, non compliant with treatment plan, has fioricet for as needed use  Non-compliant behavior Patient not bringing medication to visits, takes only the medication she want to, does not follow through with treatment plan, this needs to change fpr appropriate and continued care, she is aware

## 2014-11-27 ENCOUNTER — Other Ambulatory Visit: Payer: Self-pay | Admitting: Family Medicine

## 2014-11-27 ENCOUNTER — Other Ambulatory Visit: Payer: Self-pay

## 2014-11-27 MED ORDER — FLUCONAZOLE 150 MG PO TABS: 150.0000 mg | ORAL_TABLET | Freq: Once | ORAL | Status: DC

## 2014-11-27 MED ORDER — DOXYCYCLINE HYCLATE 100 MG PO TABS: 100.0000 mg | ORAL_TABLET | Freq: Two times a day (BID) | ORAL | Status: DC

## 2014-11-29 ENCOUNTER — Telehealth: Payer: Self-pay | Admitting: Family Medicine

## 2014-11-29 DIAGNOSIS — R4689 Other symptoms and signs involving appearance and behavior: Secondary | ICD-10-CM | POA: Insufficient documentation

## 2014-11-29 NOTE — Assessment & Plan Note (Signed)
Controlled, no change in medication  

## 2014-11-29 NOTE — Assessment & Plan Note (Signed)
Recurrent vaginitis sym[ptoms, urine sent for testing and will treat based on result

## 2014-11-29 NOTE — Assessment & Plan Note (Signed)
Chronic , no change in management back pain unchanged, will attempt to change to gabaptin,, will discuss at next visit

## 2014-11-29 NOTE — Assessment & Plan Note (Signed)
2 week h/o left calf pain, very low sus[picion for DVT, likley neuropathic pain, will r/o clot with [place] of venous sytem

## 2014-11-29 NOTE — Assessment & Plan Note (Signed)
Stable intermittent and infrequent need for rescue inhaler

## 2014-11-29 NOTE — Assessment & Plan Note (Signed)
denmies significant recurrence, non compliant with treatment plan, has fioricet for as needed use

## 2014-11-29 NOTE — Assessment & Plan Note (Signed)
Patient not bringing medication to visits, takes only the medication she want to, does not follow through with treatment plan, this needs to change fpr appropriate and continued care, she is aware

## 2014-11-29 NOTE — Assessment & Plan Note (Signed)

## 2014-11-29 NOTE — Telephone Encounter (Signed)
pls print  Her narc profile , also she needs urine drug screewn when she next collects her pain script please

## 2014-11-30 LAB — URINE CYTOLOGY ANCILLARY ONLY: CANDIDA VAGINITIS: NEGATIVE

## 2014-11-30 NOTE — Telephone Encounter (Signed)
narc profile printed. Note left to do UDS at next collection

## 2014-12-04 ENCOUNTER — Other Ambulatory Visit: Payer: Self-pay

## 2014-12-04 MED ORDER — FLUCONAZOLE 150 MG PO TABS: 150.0000 mg | ORAL_TABLET | Freq: Once | ORAL | Status: DC

## 2014-12-04 MED ORDER — OXYCODONE-ACETAMINOPHEN 5-325 MG PO TABS: 1.0000 | ORAL_TABLET | Freq: Every evening | ORAL | Status: DC | PRN

## 2014-12-04 MED ORDER — METRONIDAZOLE 500 MG PO TABS: 500.0000 mg | ORAL_TABLET | Freq: Two times a day (BID) | ORAL | Status: DC

## 2014-12-04 NOTE — Addendum Note (Signed)
Addended by: Denman George B on: 12/04/2014 09:22 AM   Modules accepted: Orders

## 2014-12-14 ENCOUNTER — Telehealth: Payer: Self-pay

## 2014-12-14 DIAGNOSIS — Z5189 Encounter for other specified aftercare: Secondary | ICD-10-CM

## 2014-12-14 NOTE — Telephone Encounter (Signed)
Order for UDS for medication adherence

## 2014-12-15 LAB — PRESCRIPTION MONITORING PROFILE (SOLSTAS)
Amphetamine/Meth: NEGATIVE ng/mL
BENZODIAZEPINE SCREEN, URINE: NEGATIVE ng/mL
BUPRENORPHINE, URINE: NEGATIVE ng/mL
Barbiturate Screen, Urine: NEGATIVE ng/mL
COCAINE METABOLITES: NEGATIVE ng/mL
Cannabinoid Scrn, Ur: NEGATIVE ng/mL
Carisoprodol, Urine: NEGATIVE ng/mL
Creatinine, Urine: 181.74 mg/dL (ref >20.0)
FENTANYL URINE: NEGATIVE ng/mL
MDMA URINE: NEGATIVE ng/mL
METHADONE SCREEN, URINE: NEGATIVE ng/mL
Meperidine, Ur: NEGATIVE ng/mL
Nitrites, Initial: NEGATIVE ug/mL
Opiate Screen, Urine: NEGATIVE ng/mL
Oxycodone Screen, Ur: NEGATIVE ng/mL
Propoxyphene: NEGATIVE ng/mL
TAPENTADOLUR: NEGATIVE ng/mL
Tramadol Scrn, Ur: NEGATIVE ng/mL
Zolpidem, Urine: NEGATIVE ng/mL
pH, Initial: 5.4 pH (ref 4.5–8.9)

## 2014-12-16 ENCOUNTER — Telehealth: Payer: Self-pay | Admitting: *Deleted

## 2014-12-16 MED ORDER — METRONIDAZOLE 500 MG PO TABS: 500.0000 mg | ORAL_TABLET | Freq: Two times a day (BID) | ORAL | Status: DC

## 2014-12-16 MED ORDER — FLUCONAZOLE 150 MG PO TABS: 150.0000 mg | ORAL_TABLET | Freq: Once | ORAL | Status: DC

## 2014-12-16 NOTE — Telephone Encounter (Signed)
meds resent

## 2014-12-16 NOTE — Telephone Encounter (Signed)
Pt called stating her medications that was called in are to high and she wants them called into [facility] in [place]. Please advise

## 2014-12-23 ENCOUNTER — Ambulatory Visit: Payer: 59

## 2014-12-23 VITALS — BP 130/82

## 2014-12-23 DIAGNOSIS — I10 Essential (primary) hypertension: Secondary | ICD-10-CM

## 2014-12-23 NOTE — Progress Notes (Signed)
Patient in for nurse visit for blood pressure evaluation.  Blood pressure checked manually.  No signs of discomforted or voiced complaints.  Patient is taking medication for blood pressure (Amlodipine) as prescribed.  Will keep next scheduled office visit.

## 2014-12-28 ENCOUNTER — Telehealth: Payer: Self-pay | Admitting: *Deleted

## 2014-12-28 MED ORDER — ALPRAZOLAM 1 MG PO TABS: ORAL_TABLET | ORAL | Status: DC

## 2014-12-28 NOTE — Telephone Encounter (Signed)
Pt called back stating she needs her xanax refilled to [facility],.

## 2014-12-28 NOTE — Telephone Encounter (Signed)
MED REFILLED  

## 2014-12-28 NOTE — Telephone Encounter (Signed)
PT wants to talk with Brandi. [phone]

## 2014-12-28 NOTE — Telephone Encounter (Signed)
Left message to call back  

## 2014-12-31 ENCOUNTER — Telehealth: Payer: Self-pay | Admitting: Family Medicine

## 2014-12-31 NOTE — Telephone Encounter (Signed)
Patient aware that rx is at [facility]

## 2014-12-31 NOTE — Telephone Encounter (Signed)
Patient is asking for a refill on ALPRAZolam (XANAX) 1 MG tablet, please advise?

## 2015-01-08 ENCOUNTER — Other Ambulatory Visit: Payer: Self-pay

## 2015-01-08 MED ORDER — OXYCODONE-ACETAMINOPHEN 5-325 MG PO TABS: 1.0000 | ORAL_TABLET | Freq: Every evening | ORAL | Status: DC | PRN

## 2015-01-09 ENCOUNTER — Encounter (HOSPITAL_COMMUNITY): Payer: Self-pay | Admitting: Emergency Medicine

## 2015-01-09 ENCOUNTER — Emergency Department (HOSPITAL_COMMUNITY)
Admission: EM | Admit: 2015-01-09 | Discharge: 2015-01-09 | Disposition: A | Discharge status: Home / Self Care | Payer: 59 | Attending: Emergency Medicine | Admitting: Emergency Medicine

## 2015-01-09 DIAGNOSIS — F329 Major depressive disorder, single episode, unspecified: Secondary | ICD-10-CM | POA: Insufficient documentation

## 2015-01-09 DIAGNOSIS — S91332A Puncture wound without foreign body, left foot, initial encounter: Secondary | ICD-10-CM

## 2015-01-09 DIAGNOSIS — G43909 Migraine, unspecified, not intractable, without status migrainosus: Secondary | ICD-10-CM | POA: Insufficient documentation

## 2015-01-09 DIAGNOSIS — Y9389 Activity, other specified: Secondary | ICD-10-CM | POA: Insufficient documentation

## 2015-01-09 DIAGNOSIS — W450XXA Nail entering through skin, initial encounter: Secondary | ICD-10-CM | POA: Insufficient documentation

## 2015-01-09 DIAGNOSIS — Z79899 Other long term (current) drug therapy: Secondary | ICD-10-CM | POA: Insufficient documentation

## 2015-01-09 DIAGNOSIS — Y9289 Other specified places as the place of occurrence of the external cause: Secondary | ICD-10-CM | POA: Insufficient documentation

## 2015-01-09 DIAGNOSIS — Z720 Tobacco use: Secondary | ICD-10-CM | POA: Insufficient documentation

## 2015-01-09 DIAGNOSIS — Y998 Other external cause status: Secondary | ICD-10-CM | POA: Insufficient documentation

## 2015-01-09 DIAGNOSIS — Z862 Personal history of diseases of the blood and blood-forming organs and certain disorders involving the immune mechanism: Secondary | ICD-10-CM | POA: Insufficient documentation

## 2015-01-09 DIAGNOSIS — F419 Anxiety disorder, unspecified: Secondary | ICD-10-CM | POA: Insufficient documentation

## 2015-01-09 DIAGNOSIS — Z8742 Personal history of other diseases of the female genital tract: Secondary | ICD-10-CM | POA: Insufficient documentation

## 2015-01-09 DIAGNOSIS — Z91040 Latex allergy status: Secondary | ICD-10-CM | POA: Insufficient documentation

## 2015-01-09 MED ORDER — CIPROFLOXACIN HCL 250 MG PO TABS
500.0000 mg | ORAL_TABLET | Freq: Once | ORAL | Status: AC
  Administered 2015-01-09: 500 mg via ORAL
  Filled 2015-01-09: qty 2

## 2015-01-09 MED ORDER — TETANUS-DIPHTH-ACELL PERTUSSIS 5-2.5-18.5 LF-MCG/0.5 IM SUSP
0.5000 mL | Freq: Once | INTRAMUSCULAR | Status: AC
  Administered 2015-01-09: 0.5 mL via INTRAMUSCULAR
  Filled 2015-01-09: qty 0.5

## 2015-01-09 MED ORDER — IBUPROFEN 800 MG PO TABS
800.0000 mg | ORAL_TABLET | Freq: Once | ORAL | Status: AC
  Administered 2015-01-09: 800 mg via ORAL
  Filled 2015-01-09: qty 1

## 2015-01-09 MED ORDER — CIPROFLOXACIN HCL 500 MG PO TABS: 500.0000 mg | ORAL_TABLET | Freq: Two times a day (BID) | ORAL | Status: DC

## 2015-01-09 NOTE — ED Notes (Signed)
Pt stepped on nail with left foot around 1pm today

## 2015-01-09 NOTE — ED Provider Notes (Signed)
CSN: [id number]     Arrival date & time 01/09/15  1332 History   First MD Initiated Contact with Patient 01/09/15 1348     Chief Complaint  Patient presents with  . Foot Injury     (Consider location/radiation/quality/duration/timing/severity/associated sxs/prior Treatment) Patient is a 39 y.o. female presenting with foot injury. The history is provided by the patient. No language interpreter was used.  Foot Injury  Rebecca Boyd is a 39 year old female who presents after stepping on a nail with her left foot around 1 hour ago. She states that she had to tug on the nail to pull it out. She cleaned the nail out with peroxide prior to arrival. Pain is worse with walking. She denies taking any medication before arriving. She denies any numbness or tingling to the foot. Tetanus is not up-to-date.  Past Medical History  Diagnosis Date  . Depressive disorder, not elsewhere classified   . Neck pain   . Vaginitis     cyctitis  . Miscarriage 2009  . Anemia   . Anxiety   . Migraine    Past Surgical History  Procedure Laterality Date  . Cervical cryotherapy N/A 1999  . Laparoscopic bilateral salpingectomy Bilateral 08/26/2013    Procedure: LAPAROSCOPIC BILATERAL SALPINGECTOMY AND REMOVAL OF LEFT PERITUBAL CYST Procedure #1;  Surgeon: Jonnie Kind, MD;  Location: [facility];  Service: Gynecology;  Laterality: Bilateral;  . Dilitation & currettage/hystroscopy with thermachoice ablation  08/26/2013    Procedure: DILATATION & CURETTAGE/HYSTEROSCOPY WITH THERMACHOICE ENDOMETRIAL ABLATION Procedure #2 Total Therapy Time=min       sec;  Surgeon: Jonnie Kind, MD;  Location: [facility];  Service: Gynecology;;  . Vulvar lesion removal  08/26/2013    Procedure: REMOVAL OF VULVAR SEBACEOUS CYST Procedure #3;  Surgeon: Jonnie Kind, MD;  Location: [facility];  Service: Gynecology;;  . Laparoscopic lysis of adhesions  08/26/2013    Procedure: LAPAROSCOPIC LYSIS OF ADHESIONS Procedure #1;  Surgeon: Jonnie Kind,  MD;  Location: [facility];  Service: Gynecology;;   Family History  Problem Relation Age of Onset  . Drug abuse Mother   . Hypertension Father   . Hypertension Maternal Grandmother   . Hypertension Maternal Grandfather   . Hypertension Paternal Grandmother    Social History  Substance Use Topics  . Smoking status: Light Tobacco Smoker -- 0.25 packs/day    Types: Cigars  . Smokeless tobacco: Never Used  . Alcohol Use: Yes     Comment: occasional   OB History    No data available     Review of Systems  Musculoskeletal: Positive for myalgias.  Skin: Positive for wound.  Neurological: Negative for numbness.      Allergies  Diphenhydramine hcl; Orange fruit; and Latex  Home Medications   Prior to Admission medications   Medication Sig Start Date End Date Taking? Authorizing Provider  albuterol (PROVENTIL HFA;VENTOLIN HFA) 108 (90 BASE) MCG/ACT inhaler Inhale 2 puffs into the lungs every 4 (four) hours as needed for wheezing or shortness of breath. 07/27/14  Yes Fayrene Helper, MD  ALPRAZolam Duanne Moron) 1 MG tablet TAKE 1 TABLET AT BEDTIME AS NEEDED FOR ANXIETY. 12/28/14  Yes Fayrene Helper, MD  amLODipine (NORVASC) 2.5 MG tablet Take 1 tablet (2.5 mg total) by mouth daily. 11/25/14  Yes Fayrene Helper, MD  butalbital-acetaminophen-caffeine (FIORICET) [phone] MG per tablet Take 1 tablet by mouth every 4 (four) hours as needed for headache. 10/09/14 10/09/15 Yes Tammy Triplett, PA-C  oxyCODONE-acetaminophen (PERCOCET/ROXICET)  5-325 MG per tablet Take 1 tablet by mouth at bedtime.   Yes Historical Provider, MD  SUMAtriptan (IMITREX) 100 MG tablet One tablet at headache onset, may repaeat once after 24 hours if headache persists. Maximum of 2 tablets in 24 hour period 06/04/14  Yes Fayrene Helper, MD  topiramate (TOPAMAX) 50 MG tablet Take 1 tablet (50 mg total) by mouth 2 (two) times daily. 06/04/14  Yes Fayrene Helper, MD  Vitamin D, Ergocalciferol, (DRISDOL) 50000 UNITS  CAPS capsule Take 1 capsule (50,000 Units total) by mouth every 7 (seven) days. 11/25/14  Yes Fayrene Helper, MD  ciprofloxacin (CIPRO) 500 MG tablet Take 1 tablet (500 mg total) by mouth every 12 (twelve) hours. 01/09/15   Jovanny Stephanie Patel-Mills, PA-C   BP 158/93 mmHg  Pulse 96  Temp(Src) 98.6 F (37 C) (Oral)  Resp 20  Ht 5' (1.524 m)  Wt 145 lb (65.772 kg)  BMI 28.32 kg/m2  SpO2 100%  LMP 12/21/2014 Physical Exam  Constitutional: She is oriented to person, place, and time. She appears well-developed and well-nourished.  HENT:  Head: Normocephalic and atraumatic.  Eyes: Conjunctivae are normal.  Neck: Neck supple.  Cardiovascular: Normal rate.   Pulmonary/Chest: Effort normal. No respiratory distress.  Musculoskeletal: Normal range of motion.  Neurological: She is alert and oriented to person, place, and time.  Skin: Skin is warm and dry.  Left foot: Small puncture wound to the dorsum of the left foot below the first metatarsal. No active bleeding. No foreign bodies palpated or seen. 2+ DP pulse. Able to flex and extend toes. Less than 2 second capillary refill. Able to ambulate.  Psychiatric: She has a normal mood and affect. Her behavior is normal.  Nursing note and vitals reviewed.   ED Course  Procedures (including critical care time) Labs Review Labs Reviewed - No data to display  Imaging Review No results found.   EKG Interpretation None      MDM   Final diagnoses:  Puncture wound of foot, left, initial encounter  Patient presents for puncture wound to the dorsum of the left foot. No foreign bodies noted. X-ray is unnecessary at this time. The wound was cleaned prior to arrival and appears clean now. I discussed taking anabiotic's for this. I also gave her return precautions such as fever, increased redness or swelling, or drainage from the wound. I discussed following up with her primary care physician and taking Tylenol or Motrin for pain. She was also given a  work note for today. Patient verbally agrees with the plan. Medications  Tdap (BOOSTRIX) injection 0.5 mL (0.5 mLs Intramuscular Given 01/09/15 1352)  ibuprofen (ADVIL,MOTRIN) tablet 800 mg (800 mg Oral Given 01/09/15 1410)  ciprofloxacin (CIPRO) tablet 500 mg (500 mg Oral Given 01/09/15 1410)       Ottie Glazier, PA-C 01/09/15 1412  Virgel Manifold, MD 01/11/15 0800

## 2015-01-09 NOTE — ED Notes (Signed)
Patient reports of stepping on nail this morning. Puncture wound noted to bottom of left foot. No bleeding noted. Unknown last tetanus shot.

## 2015-01-09 NOTE — Discharge Instructions (Signed)
Puncture Wound Follow-up with your primary care physician. Return for fever, increased swelling or redness of the foot. Take anabiotic says prescribed. A puncture wound happens when a sharp object pokes a hole in the skin. A puncture wound can cause an infection because germs can go under the skin during the injury. HOME CARE   Change your bandage (dressing) once a day, or as told by your doctor. If the bandage sticks, soak it in water.  Keep all doctor visits as told.  Only take medicine as told by your doctor.  Take your medicine (antibiotics) as told. Finish them even if you start to feel better. You may need a tetanus shot if:  You cannot remember when you had your last tetanus shot.  You have never had a tetanus shot. If you need a tetanus shot and you choose not to have one, you may get tetanus. Sickness from tetanus can be serious. You may need a rabies shot if an animal bite caused your wound. GET HELP RIGHT AWAY IF:   Your wound is red, puffy (swollen), or painful.  You see red lines on the skin near the wound.  You have a bad smell coming from the wound or bandage.  You have yellowish-white fluid (pus) coming from the wound.  Your medicine is not working.  You notice an object in the wound.  You have a fever.  You have severe pain.  You have trouble breathing.  You feel dizzy or pass out (faint).  You keep throwing up (vomiting).  You lose feeling (numbness) in your arm or leg, or you cannot move your arm or leg.  Your problems get worse. MAKE SURE YOU:   Understand these instructions.  Will watch your condition.  Will get help right away if you are not doing well or get worse. Document Released: 01/25/2008 Document Revised: 07/10/2011 Document Reviewed: 10/04/2010 [facility] Patient Information 2015 [place], [place]. This information is not intended to replace advice given to you by your health care provider. Make sure you discuss any questions you have  with your health care provider.

## 2015-01-29 ENCOUNTER — Other Ambulatory Visit: Payer: Self-pay

## 2015-01-29 MED ORDER — OXYCODONE-ACETAMINOPHEN 5-325 MG PO TABS: 1.0000 | ORAL_TABLET | Freq: Every day | ORAL | Status: DC

## 2015-02-09 ENCOUNTER — Encounter (HOSPITAL_COMMUNITY): Payer: Self-pay | Admitting: Emergency Medicine

## 2015-02-09 ENCOUNTER — Emergency Department (HOSPITAL_COMMUNITY): Payer: 59

## 2015-02-09 ENCOUNTER — Emergency Department (HOSPITAL_COMMUNITY)
Admission: EM | Admit: 2015-02-09 | Discharge: 2015-02-10 | Disposition: A | Discharge status: Home / Self Care | Payer: 59 | Attending: Emergency Medicine | Admitting: Emergency Medicine

## 2015-02-09 DIAGNOSIS — G43909 Migraine, unspecified, not intractable, without status migrainosus: Secondary | ICD-10-CM | POA: Insufficient documentation

## 2015-02-09 DIAGNOSIS — Z79899 Other long term (current) drug therapy: Secondary | ICD-10-CM | POA: Insufficient documentation

## 2015-02-09 DIAGNOSIS — Z720 Tobacco use: Secondary | ICD-10-CM | POA: Insufficient documentation

## 2015-02-09 DIAGNOSIS — Z862 Personal history of diseases of the blood and blood-forming organs and certain disorders involving the immune mechanism: Secondary | ICD-10-CM | POA: Insufficient documentation

## 2015-02-09 DIAGNOSIS — R05 Cough: Secondary | ICD-10-CM

## 2015-02-09 DIAGNOSIS — R0981 Nasal congestion: Secondary | ICD-10-CM

## 2015-02-09 DIAGNOSIS — F419 Anxiety disorder, unspecified: Secondary | ICD-10-CM | POA: Insufficient documentation

## 2015-02-09 DIAGNOSIS — Z91040 Latex allergy status: Secondary | ICD-10-CM | POA: Insufficient documentation

## 2015-02-09 DIAGNOSIS — R059 Cough, unspecified: Secondary | ICD-10-CM

## 2015-02-09 DIAGNOSIS — F329 Major depressive disorder, single episode, unspecified: Secondary | ICD-10-CM | POA: Insufficient documentation

## 2015-02-09 DIAGNOSIS — Z8742 Personal history of other diseases of the female genital tract: Secondary | ICD-10-CM | POA: Insufficient documentation

## 2015-02-09 DIAGNOSIS — J069 Acute upper respiratory infection, unspecified: Secondary | ICD-10-CM | POA: Insufficient documentation

## 2015-02-09 NOTE — ED Notes (Signed)
Pt c/o cough, sore throat, back pain, sob and head congestion since Monday.

## 2015-02-09 NOTE — ED Provider Notes (Signed)
CSN: [id number]     Arrival date & time 02/09/15  2311 History  By signing my name below, I, Hilda Lias, attest that this documentation has been prepared under the direction and in the presence of Merryl Hacker, MD. Electronically Signed: Hilda Lias, ED Scribe. 02/09/2015. 11:52 PM.    Chief Complaint  Patient presents with  . Cough     The history is provided by the patient. No language interpreter was used.   HPI Comments: Rebecca Boyd is a 39 y.o. female who presents to the Emergency Department complaining of a worsening, dry cough with associated chills, SOB, sore throat, nasal congestion, and generalized body aches that has been present since yesterday. Pt states she used a nasal saline and Musinex with no relief. Pt denies trying any other medications or remedies to treat her symptoms. Pt denies fever.    Past Medical History  Diagnosis Date  . Depressive disorder, not elsewhere classified   . Neck pain   . Vaginitis     cyctitis  . Miscarriage 2009  . Anemia   . Anxiety   . Migraine    Past Surgical History  Procedure Laterality Date  . Cervical cryotherapy N/A 1999  . Laparoscopic bilateral salpingectomy Bilateral 08/26/2013    Procedure: LAPAROSCOPIC BILATERAL SALPINGECTOMY AND REMOVAL OF LEFT PERITUBAL CYST Procedure #1;  Surgeon: Jonnie Kind, MD;  Location: [facility];  Service: Gynecology;  Laterality: Bilateral;  . Dilitation & currettage/hystroscopy with thermachoice ablation  08/26/2013    Procedure: DILATATION & CURETTAGE/HYSTEROSCOPY WITH THERMACHOICE ENDOMETRIAL ABLATION Procedure #2 Total Therapy Time=min       sec;  Surgeon: Jonnie Kind, MD;  Location: [facility];  Service: Gynecology;;  . Vulvar lesion removal  08/26/2013    Procedure: REMOVAL OF VULVAR SEBACEOUS CYST Procedure #3;  Surgeon: Jonnie Kind, MD;  Location: [facility];  Service: Gynecology;;  . Laparoscopic lysis of adhesions  08/26/2013    Procedure: LAPAROSCOPIC LYSIS OF ADHESIONS  Procedure #1;  Surgeon: Jonnie Kind, MD;  Location: [facility];  Service: Gynecology;;   Family History  Problem Relation Age of Onset  . Drug abuse Mother   . Hypertension Father   . Hypertension Maternal Grandmother   . Hypertension Maternal Grandfather   . Hypertension Paternal Grandmother    Social History  Substance Use Topics  . Smoking status: Light Tobacco Smoker -- 0.25 packs/day    Types: Cigars  . Smokeless tobacco: Never Used  . Alcohol Use: Yes     Comment: occasional   OB History    No data available     Review of Systems  Constitutional: Positive for chills. Negative for fever.  HENT: Positive for congestion and sore throat.   Respiratory: Positive for cough and shortness of breath. Negative for chest tightness.   Cardiovascular: Negative for chest pain.  Gastrointestinal: Negative for nausea, vomiting and abdominal pain.  Genitourinary: Negative for dysuria.  Musculoskeletal: Positive for myalgias.  Skin: Negative for wound.  Neurological: Negative for headaches.  Psychiatric/Behavioral: Negative for confusion.  All other systems reviewed and are negative.     Allergies  Diphenhydramine hcl; Orange fruit; and Latex  Home Medications   Prior to Admission medications   Medication Sig Start Date End Date Taking? Authorizing Provider  albuterol (PROVENTIL HFA;VENTOLIN HFA) 108 (90 BASE) MCG/ACT inhaler Inhale 2 puffs into the lungs every 4 (four) hours as needed for wheezing or shortness of breath. 07/27/14  Yes Fayrene Helper, MD  ALPRAZolam (  XANAX) 1 MG tablet TAKE 1 TABLET AT BEDTIME AS NEEDED FOR ANXIETY. 12/28/14  Yes Fayrene Helper, MD  amLODipine (NORVASC) 2.5 MG tablet Take 1 tablet (2.5 mg total) by mouth daily. 11/25/14  Yes Fayrene Helper, MD  butalbital-acetaminophen-caffeine (FIORICET) [phone] MG per tablet Take 1 tablet by mouth every 4 (four) hours as needed for headache. 10/09/14 10/09/15 Yes Tammy Triplett, PA-C   oxyCODONE-acetaminophen (PERCOCET/ROXICET) 5-325 MG tablet Take 1 tablet by mouth at bedtime. 01/29/15  Yes Fayrene Helper, MD  topiramate (TOPAMAX) 50 MG tablet Take 1 tablet (50 mg total) by mouth 2 (two) times daily. 06/04/14  Yes Fayrene Helper, MD  Vitamin D, Ergocalciferol, (DRISDOL) 50000 UNITS CAPS capsule Take 1 capsule (50,000 Units total) by mouth every 7 (seven) days. 11/25/14  Yes Fayrene Helper, MD  ciprofloxacin (CIPRO) 500 MG tablet Take 1 tablet (500 mg total) by mouth every 12 (twelve) hours. 01/09/15   Hanna Patel-Mills, PA-C  fluticasone (FLONASE) 50 MCG/ACT nasal spray Place 2 sprays into both nostrils daily. 02/10/15   Merryl Hacker, MD  SUMAtriptan (IMITREX) 100 MG tablet One tablet at headache onset, may repaeat once after 24 hours if headache persists. Maximum of 2 tablets in 24 hour period 06/04/14   Fayrene Helper, MD   BP 157/108 mmHg  Pulse 93  Temp(Src) 98 F (36.7 C)  Resp 18  Ht 5' (1.524 m)  Wt 145 lb (65.772 kg)  BMI 28.32 kg/m2  SpO2 100%  LMP 01/26/2015 Physical Exam  Constitutional: She is oriented to person, place, and time. She appears well-developed and well-nourished. No distress.  HENT:  Head: Normocephalic and atraumatic.  Left Ear: External ear normal.  Fullness behind right TM, no purulence noted, mild erythematous posterior oropharynx, no exudate noted, nasal congestion noted  Eyes: Pupils are equal, round, and reactive to light.  Neck: Neck supple.  Cardiovascular: Normal rate, regular rhythm and normal heart sounds.   No murmur heard. Pulmonary/Chest: Effort normal and breath sounds normal. No respiratory distress. She has no wheezes. She has no rales.  Abdominal: Soft. Bowel sounds are normal. There is no tenderness. There is no rebound.  Neurological: She is alert and oriented to person, place, and time.  Skin: Skin is warm and dry.  Psychiatric: She has a normal mood and affect.  Nursing note and vitals  reviewed.   ED Course  Procedures (including critical care time)  DIAGNOSTIC STUDIES: Oxygen Saturation is 100% on room air, normal by my interpretation.    COORDINATION OF CARE: 11:52 PM Discussed treatment plan with pt at bedside and pt agreed to plan.   Labs Review Labs Reviewed - No data to display  Imaging Review Dg Chest 2 View  02/09/2015   CLINICAL DATA:  Cough, sore throat, back pain, shortness of breath, and congestion since Monday.  EXAM: CHEST  2 VIEW  COMPARISON:  04/04/2014  FINDINGS: The heart size and mediastinal contours are within normal limits. Both lungs are clear. The visualized skeletal structures are unremarkable.  IMPRESSION: No active cardiopulmonary disease.   Electronically Signed   By: Lucienne Capers M.D.   On: 02/09/2015 23:48   I have personally reviewed and evaluated these images and lab results as part of my medical decision-making.   EKG Interpretation None      MDM   Final diagnoses:  Cough  Nasal congestion  URI (upper respiratory infection)    Patient presents with upper respiratory symptoms, myalgias, and chills. Nontoxic on  exam. Afebrile. Exam is largely unremarkable. No tonsillar exudate suggestive of bacterial pharyngitis. No evidence of otitis media. Lung sounds are clear. Chest x-ray shows no evidence of pneumonia. Suspect upper respiratory virus. Given absence of fever, low suspicion at this time for influenza and patient is approximately 2 days into illness. Discussed with patient supportive care at home including continued nasal saline, Flonase, Mucinex when necessary, and ibuprofen for myalgias. Patient stated understanding.  After history, exam, and medical workup I feel the patient has been appropriately medically screened and is safe for discharge home. Pertinent diagnoses were discussed with the patient. Patient was given return precautions.  I personally performed the services described in this documentation, which was  scribed in my presence. The recorded information has been reviewed and is accurate.    Merryl Hacker, MD 02/10/15 [phone]

## 2015-02-10 MED ORDER — IBUPROFEN 400 MG PO TABS
400.0000 mg | ORAL_TABLET | Freq: Once | ORAL | Status: AC
  Administered 2015-02-10: 400 mg via ORAL
  Filled 2015-02-10: qty 1

## 2015-02-10 MED ORDER — FLUTICASONE PROPIONATE 50 MCG/ACT NA SUSP: 2.0000 | Freq: Every day | NASAL | Status: DC

## 2015-02-10 NOTE — Discharge Instructions (Signed)
Upper Respiratory Infection, Adult Most upper respiratory infections (URIs) are a viral infection of the air passages leading to the lungs. A URI affects the nose, throat, and upper air passages. The most common type of URI is nasopharyngitis and is typically referred to as "the common cold." URIs run their course and usually go away on their own. Most of the time, a URI does not require medical attention, but sometimes a bacterial infection in the upper airways can follow a viral infection. This is called a secondary infection. Sinus and middle ear infections are common types of secondary upper respiratory infections. Bacterial pneumonia can also complicate a URI. A URI can worsen asthma and chronic obstructive pulmonary disease (COPD). Sometimes, these complications can require emergency medical care and may be life threatening.  CAUSES Almost all URIs are caused by viruses. A virus is a type of germ and can spread from one person to another.  RISKS FACTORS You may be at risk for a URI if:   You smoke.   You have chronic heart or lung disease.  You have a weakened defense (immune) system.   You are very young or very old.   You have nasal allergies or asthma.  You work in crowded or poorly ventilated areas.  You work in health care facilities or schools. SIGNS AND SYMPTOMS  Symptoms typically develop 2-3 days after you come in contact with a cold virus. Most viral URIs last 7-10 days. However, viral URIs from the influenza virus (flu virus) can last 14-18 days and are typically more severe. Symptoms may include:   Runny or stuffy (congested) nose.   Sneezing.   Cough.   Sore throat.   Headache.   Fatigue.   Fever.   Loss of appetite.   Pain in your forehead, behind your eyes, and over your cheekbones (sinus pain).  Muscle aches.  DIAGNOSIS  Your health care provider may diagnose a URI by:  Physical exam.  Tests to check that your symptoms are not due to  another condition such as:  Strep throat.  Sinusitis.  Pneumonia.  Asthma. TREATMENT  A URI goes away on its own with time. It cannot be cured with medicines, but medicines may be prescribed or recommended to relieve symptoms. Medicines may help:  Reduce your fever.  Reduce your cough.  Relieve nasal congestion. HOME CARE INSTRUCTIONS   Take medicines only as directed by your health care provider.   Gargle warm saltwater or take cough drops to comfort your throat as directed by your health care provider.  Use a warm mist humidifier or inhale steam from a shower to increase air moisture. This may make it easier to breathe.  Drink enough fluid to keep your urine clear or pale yellow.   Eat soups and other clear broths and maintain good nutrition.   Rest as needed.   Return to work when your temperature has returned to normal or as your health care provider advises. You may need to stay home longer to avoid infecting others. You can also use a face mask and careful hand washing to prevent spread of the virus.  Increase the usage of your inhaler if you have asthma.   Do not use any tobacco products, including cigarettes, chewing tobacco, or electronic cigarettes. If you need help quitting, ask your health care provider. PREVENTION  The best way to protect yourself from getting a cold is to practice good hygiene.   Avoid oral or hand contact with people with cold   symptoms.   Wash your hands often if contact occurs.  There is no clear evidence that vitamin C, vitamin E, echinacea, or exercise reduces the chance of developing a cold. However, it is always recommended to get plenty of rest, exercise, and practice good nutrition.  SEEK MEDICAL CARE IF:   You are getting worse rather than better.   Your symptoms are not controlled by medicine.   You have chills.  You have worsening shortness of breath.  You have brown or red mucus.  You have yellow or brown nasal  discharge.  You have pain in your face, especially when you bend forward.  You have a fever.  You have swollen neck glands.  You have pain while swallowing.  You have white areas in the back of your throat. SEEK IMMEDIATE MEDICAL CARE IF:   You have severe or persistent:  Headache.  Ear pain.  Sinus pain.  Chest pain.  You have chronic lung disease and any of the following:  Wheezing.  Prolonged cough.  Coughing up blood.  A change in your usual mucus.  You have a stiff neck.  You have changes in your:  Vision.  Hearing.  Thinking.  Mood. MAKE SURE YOU:   Understand these instructions.  Will watch your condition.  Will get help right away if you are not doing well or get worse.   This information is not intended to replace advice given to you by your health care provider. Make sure you discuss any questions you have with your health care provider.   Document Released: 10/11/2000 Document Revised: 09/01/2014 Document Reviewed: 07/23/2013 Elsevier Interactive Patient Education 2016 [employer]  

## 2015-03-05 ENCOUNTER — Other Ambulatory Visit: Payer: Self-pay

## 2015-03-05 MED ORDER — OXYCODONE-ACETAMINOPHEN 5-325 MG PO TABS: 1.0000 | ORAL_TABLET | Freq: Every day | ORAL | Status: DC

## 2015-03-15 ENCOUNTER — Telehealth: Payer: Self-pay | Admitting: Family Medicine

## 2015-03-15 NOTE — Telephone Encounter (Signed)
Patient aware that rx is ready.  

## 2015-03-15 NOTE — Telephone Encounter (Signed)
Patient is requesting a med refill on oxyCODONE-acetaminophen (PERCOCET/ROXICET) 5-325 MG tablet please advise?

## 2015-03-17 ENCOUNTER — Ambulatory Visit (INDEPENDENT_AMBULATORY_CARE_PROVIDER_SITE_OTHER): Payer: 59 | Admitting: Family Medicine

## 2015-03-17 ENCOUNTER — Encounter: Payer: Self-pay | Admitting: Family Medicine

## 2015-03-17 VITALS — BP 138/84 | HR 82 | Resp 16 | Ht 60.0 in | Wt 148.0 lb

## 2015-03-17 DIAGNOSIS — G43109 Migraine with aura, not intractable, without status migrainosus: Secondary | ICD-10-CM

## 2015-03-17 DIAGNOSIS — E669 Obesity, unspecified: Secondary | ICD-10-CM

## 2015-03-17 DIAGNOSIS — J4520 Mild intermittent asthma, uncomplicated: Secondary | ICD-10-CM

## 2015-03-17 DIAGNOSIS — E559 Vitamin D deficiency, unspecified: Secondary | ICD-10-CM

## 2015-03-17 DIAGNOSIS — F419 Anxiety disorder, unspecified: Secondary | ICD-10-CM

## 2015-03-17 DIAGNOSIS — M79642 Pain in left hand: Secondary | ICD-10-CM

## 2015-03-17 DIAGNOSIS — M79641 Pain in right hand: Secondary | ICD-10-CM

## 2015-03-17 DIAGNOSIS — M5489 Other dorsalgia: Secondary | ICD-10-CM

## 2015-03-17 DIAGNOSIS — Z13220 Encounter for screening for lipoid disorders: Secondary | ICD-10-CM

## 2015-03-17 DIAGNOSIS — I10 Essential (primary) hypertension: Secondary | ICD-10-CM

## 2015-03-17 DIAGNOSIS — E663 Overweight: Secondary | ICD-10-CM | POA: Insufficient documentation

## 2015-03-17 DIAGNOSIS — F17200 Nicotine dependence, unspecified, uncomplicated: Secondary | ICD-10-CM

## 2015-03-17 DIAGNOSIS — F0630 Mood disorder due to known physiological condition, unspecified: Secondary | ICD-10-CM

## 2015-03-17 DIAGNOSIS — F17290 Nicotine dependence, other tobacco product, uncomplicated: Secondary | ICD-10-CM

## 2015-03-17 DIAGNOSIS — D500 Iron deficiency anemia secondary to blood loss (chronic): Secondary | ICD-10-CM

## 2015-03-17 MED ORDER — IBUPROFEN 600 MG PO TABS: ORAL_TABLET | ORAL | Status: DC

## 2015-03-17 MED ORDER — QUETIAPINE FUMARATE 50 MG PO TABS: 50.0000 mg | ORAL_TABLET | Freq: Every day | ORAL | Status: DC

## 2015-03-17 MED ORDER — PREDNISONE 5 MG PO TABS: 5.0000 mg | ORAL_TABLET | Freq: Two times a day (BID) | ORAL | Status: AC

## 2015-03-17 NOTE — Assessment & Plan Note (Signed)
Improved , reports commitment to daily topamax, now reports on avg one headache per month Continue med, and try to identify and avoidtriggers

## 2015-03-17 NOTE — Assessment & Plan Note (Signed)
Unchanged. Patient re-educated about  the importance of commitment to a  minimum of 150 minutes of exercise per week.  The importance of healthy food choices with portion control discussed. Encouraged to start a food diary, count calories and to consider  joining a support group. Sample diet sheets offered. Goals set by the patient for the next several months.   Weight /BMI 03/17/2015 02/09/2015 01/09/2015  WEIGHT 148 lb 145 lb 145 lb  HEIGHT 5\' 0"  5\' 0"  5\' 0"   BMI 28.9 kg/m2 28.32 kg/m2 28.32 kg/m2  Some encounter information is confidential and restricted. Go to Review Flowsheets activity to see all data.    Current exercise per week 120 minutes.

## 2015-03-17 NOTE — Assessment & Plan Note (Signed)
Controlled, no change in medication  

## 2015-03-17 NOTE — Assessment & Plan Note (Signed)

## 2015-03-17 NOTE — Progress Notes (Signed)
Subjective:    Patient ID: Rebecca Boyd, female    DOB: October 15, 1975, 39 y.o.   MRN: [id number]  HPI   Rebecca Boyd     MRN: [id number]      DOB: [birth year]   HPI [name] is here for follow up and re-evaluation of chronic medical conditions, medication management and review of any available recent lab and radiology data.  Preventive health is updated, specifically  Cancer screening and Immunization.   Questions or concerns regarding consultations or procedures which the PT has had in the interim are  addressed. The PT denies any adverse reactions to current medications since the last visit.  2 week h/o increased pain, numbness and tingling in hands C/o uncontrolled anxiety and mood disorder, states was doing better on seroquel , wants to resume, no interest in psych currently, not suicidal or homicidal ROS Denies recent fever or chills. Denies sinus pressure, nasal congestion, ear pain or sore throat. Denies chest congestion, productive cough or wheezing. Denies chest pains, palpitations and leg swelling Denies abdominal pain, nausea, vomiting,diarrhea or constipation.   Denies dysuria, frequency, hesitancy or incontinence. Denies uncontrolled joint pain, swelling and limitation in mobility. Denies uncontrolled  headaches, seizures, Denies depression, does c/o  Anxiety , insomnia managed on current medication Denies skin break down or rash.   PE  BP 138/84 mmHg  Pulse 82  Resp 16  Ht 5' (1.524 m)  Wt 148 lb (67.132 kg)  BMI 28.90 kg/m2  SpO2 100%  Patient alert and oriented and in no cardiopulmonary distress.  HEENT: No facial asymmetry, EOMI,   oropharynx pink and moist.  Neck supple no JVD, no mass.  Chest: Clear to auscultation bilaterally.  CVS: S1, S2 no murmurs, no S3.Regular rate.  ABD: Soft non tender.   Ext: No edema  MS: Adequate ROM spine, shoulders, hips and knees.  Skin: Intact, no ulcerations or rash noted.  Psych: Good eye contact,  normal affect. Memory intact not anxious or depressed appearing.  CNS: CN 2-12 intact, power,  normal throughout.no focal deficits noted.   Assessment & Plan  Nicotine dependence Patient counseled for approximately 5 minutes regarding the health risks of ongoing nicotine use, specifically all types of cancer, heart disease, stroke and respiratory failure. The options available for help with cessation ,the behavioral changes to assist the process, and the option to either gradully reduce usage  Or abruptly stop.is also discussed. Pt is also encouraged to set specific goals in number of cigarettes used daily, as well as to set a quit date.  Number of cigarettes/cigars currently smoking 1 per month   Essential hypertension, benign Controlled, no change in medication DASH diet and commitment to daily physical activity for a minimum of 30 minutes discussed and encouraged, as a part of hypertension management. The importance of attaining a healthy weight is also discussed.  BP/Weight 03/17/2015 02/09/2015 01/09/2015 12/23/2014 11/25/2014 10/31/2014 123XX123  Systolic BP 0000000 A999333 0000000 [id number] 0000000  Diastolic BP 84 123XX123 93 82 96 98 85  Wt. (Lbs) 148 145 145 - 146.12 145 144.9  BMI 28.9 28.32 28.32 - 28.54 28.32 28.3  Some encounter information is confidential and restricted. Go to Review Flowsheets activity to see all data.        Migraine headache Improved , reports commitment to daily topamax, now reports on avg one headache per month Continue med, and try to identify and avoidtriggers  Anxiety Reports poor impulse control as a cause for her smoking,  will resume seroquel, no need to see therapist currently per her report  Bilateral hand pain 2 week history, symptoms consistent with carpal tunnel, short anti inflammatory course , and neuro referral for possible injections and definitive dx  Back pain Controlled, no change in medication   Asthma, mild intermittent Controlled, no  change in medication   Mood disorder in conditions classified elsewhere resume seroquel, not referred to therapy as not currently indicated  Obesity Unchanged. Patient re-educated about  the importance of commitment to a  minimum of 150 minutes of exercise per week.  The importance of healthy food choices with portion control discussed. Encouraged to start a food diary, count calories and to consider  joining a support group. Sample diet sheets offered. Goals set by the patient for the next several months.   Weight /BMI 03/17/2015 02/09/2015 01/09/2015  WEIGHT 148 lb 145 lb 145 lb  HEIGHT 5\' 0"  5\' 0"  5\' 0"   BMI 28.9 kg/m2 28.32 kg/m2 28.32 kg/m2  Some encounter information is confidential and restricted. Go to Review Flowsheets activity to see all data.    Current exercise per week 120 minutes.        Review of Systems     Objective:   Physical Exam        Assessment & Plan:

## 2015-03-17 NOTE — Assessment & Plan Note (Signed)
resume seroquel, not referred to therapy as not currently indicated

## 2015-03-17 NOTE — Assessment & Plan Note (Signed)
Reports poor impulse control as a cause for her smoking, will resume seroquel, no need to see therapist currently per her report

## 2015-03-17 NOTE — Assessment & Plan Note (Signed)
2 week history, symptoms consistent with carpal tunnel, short anti inflammatory course , and neuro referral for possible injections and definitive dx

## 2015-03-17 NOTE — Patient Instructions (Signed)
CPE in March, call if you need me before  You are referred  To Dr Merlene Laughter re hand symptoms and 5 days medication sent  New is seroquel to help with mood  Quit cigar May 02, 2015  Please work on good  health habits so that your health will improve. 1. Commitment to daily physical activity for 30 to 60  minutes, if you are able to do this.  2. Commitment to wise food choices. Aim for half of your  food intake to be vegetable and fruit, one quarter starchy foods, and one quarter protein. Try to eat on a regular schedule  3 meals per day, snacking between meals should be limited to vegetables or fruits or small portions of nuts. 64 ounces of water per day is generally recommended, unless you have specific health conditions, like heart failure or kidney failure where you will need to limit fluid intake.  3. Commitment to sufficient and a  good quality of physical and mental rest daily, generally between 6 to 8 hours per day.  WITH PERSISTANCE AND PERSEVERANCE, THE IMPOSSIBLE , BECOMES THE NORM! Thanks for choosing [facility], we consider it a privelige to serve you.  Fasting labs early March

## 2015-03-17 NOTE — Assessment & Plan Note (Signed)
Controlled, no change in medication DASH diet and commitment to daily physical activity for a minimum of 30 minutes discussed and encouraged, as a part of hypertension management. The importance of attaining a healthy weight is also discussed.  BP/Weight 03/17/2015 02/09/2015 01/09/2015 12/23/2014 11/25/2014 10/31/2014 123XX123  Systolic BP 0000000 A999333 0000000 [id number] 123456 0000000  Diastolic BP 84 123XX123 93 82 96 98 85  Wt. (Lbs) 148 145 145 - 146.12 145 144.9  BMI 28.9 28.32 28.32 - 28.54 28.32 28.3  Some encounter information is confidential and restricted. Go to Review Flowsheets activity to see all data.

## 2015-04-09 ENCOUNTER — Other Ambulatory Visit: Payer: Self-pay

## 2015-04-09 MED ORDER — OXYCODONE-ACETAMINOPHEN 5-325 MG PO TABS: 1.0000 | ORAL_TABLET | Freq: Every day | ORAL | Status: DC

## 2015-04-29 ENCOUNTER — Telehealth: Payer: Self-pay | Admitting: Family Medicine

## 2015-04-29 MED ORDER — FLUCONAZOLE 150 MG PO TABS: 150.0000 mg | ORAL_TABLET | Freq: Once | ORAL | Status: DC

## 2015-04-29 MED ORDER — CEPHALEXIN 500 MG PO CAPS: 500.0000 mg | ORAL_CAPSULE | Freq: Three times a day (TID) | ORAL | Status: DC

## 2015-04-29 NOTE — Telephone Encounter (Signed)
pls send keflex 500 mg one three times daily for 1 week only

## 2015-04-29 NOTE — Telephone Encounter (Signed)
Patient is asking for an Antibiotic for an Abscessed Tooth she cant get an appointment with her dentist until after the first of the year, please advise?

## 2015-04-29 NOTE — Telephone Encounter (Signed)
Please advise 

## 2015-04-29 NOTE — Telephone Encounter (Signed)
Patient aware and will keep appointment with dentist for 1/6.  Medication sent to pharmacy along with 1 diflucan

## 2015-04-29 NOTE — Addendum Note (Signed)
Addended by: Denman George B on: 04/29/2015 10:25 AM   Modules accepted: Orders

## 2015-05-06 ENCOUNTER — Telehealth: Payer: Self-pay | Admitting: Family Medicine

## 2015-05-06 ENCOUNTER — Other Ambulatory Visit: Payer: Self-pay | Admitting: Family Medicine

## 2015-05-06 MED ORDER — ALPRAZOLAM 1 MG PO TABS: ORAL_TABLET | ORAL | Status: DC

## 2015-05-06 NOTE — Telephone Encounter (Signed)
Med refilled.

## 2015-05-06 NOTE — Telephone Encounter (Signed)
Patient is calling asking for a refill on medication ALPRAZolam (XANAX) 1 MG tablet please advise?

## 2015-05-07 ENCOUNTER — Other Ambulatory Visit: Payer: Self-pay

## 2015-05-07 MED ORDER — OXYCODONE-ACETAMINOPHEN 5-325 MG PO TABS: 1.0000 | ORAL_TABLET | Freq: Every day | ORAL | Status: DC

## 2015-05-10 ENCOUNTER — Ambulatory Visit: Payer: Self-pay | Admitting: Family Medicine

## 2015-05-21 ENCOUNTER — Ambulatory Visit: Payer: Self-pay | Admitting: Family Medicine

## 2015-05-26 ENCOUNTER — Emergency Department (HOSPITAL_COMMUNITY): Payer: BLUE CROSS/BLUE SHIELD

## 2015-05-26 ENCOUNTER — Emergency Department (HOSPITAL_COMMUNITY)
Admission: EM | Admit: 2015-05-26 | Discharge: 2015-05-26 | Disposition: A | Discharge status: Home / Self Care | Payer: BLUE CROSS/BLUE SHIELD | Attending: Emergency Medicine | Admitting: Emergency Medicine

## 2015-05-26 ENCOUNTER — Encounter (HOSPITAL_COMMUNITY): Payer: Self-pay | Admitting: Emergency Medicine

## 2015-05-26 DIAGNOSIS — R05 Cough: Secondary | ICD-10-CM | POA: Insufficient documentation

## 2015-05-26 DIAGNOSIS — Z8742 Personal history of other diseases of the female genital tract: Secondary | ICD-10-CM | POA: Diagnosis not present

## 2015-05-26 DIAGNOSIS — Z862 Personal history of diseases of the blood and blood-forming organs and certain disorders involving the immune mechanism: Secondary | ICD-10-CM | POA: Insufficient documentation

## 2015-05-26 DIAGNOSIS — R0602 Shortness of breath: Secondary | ICD-10-CM | POA: Diagnosis present

## 2015-05-26 DIAGNOSIS — F329 Major depressive disorder, single episode, unspecified: Secondary | ICD-10-CM | POA: Insufficient documentation

## 2015-05-26 DIAGNOSIS — R0789 Other chest pain: Secondary | ICD-10-CM | POA: Diagnosis not present

## 2015-05-26 DIAGNOSIS — Z7951 Long term (current) use of inhaled steroids: Secondary | ICD-10-CM | POA: Insufficient documentation

## 2015-05-26 DIAGNOSIS — I10 Essential (primary) hypertension: Secondary | ICD-10-CM | POA: Diagnosis not present

## 2015-05-26 DIAGNOSIS — Z91040 Latex allergy status: Secondary | ICD-10-CM | POA: Insufficient documentation

## 2015-05-26 DIAGNOSIS — Z79899 Other long term (current) drug therapy: Secondary | ICD-10-CM | POA: Insufficient documentation

## 2015-05-26 DIAGNOSIS — R0600 Dyspnea, unspecified: Secondary | ICD-10-CM | POA: Diagnosis not present

## 2015-05-26 DIAGNOSIS — R630 Anorexia: Secondary | ICD-10-CM | POA: Diagnosis not present

## 2015-05-26 DIAGNOSIS — F17210 Nicotine dependence, cigarettes, uncomplicated: Secondary | ICD-10-CM | POA: Insufficient documentation

## 2015-05-26 DIAGNOSIS — R202 Paresthesia of skin: Secondary | ICD-10-CM | POA: Diagnosis not present

## 2015-05-26 HISTORY — DX: Essential (primary) hypertension: I10

## 2015-05-26 LAB — CBC
HEMATOCRIT: 38.1 % (ref 36.0–46.0)
Hemoglobin: 13.3 g/dL (ref 12.0–15.0)
MCH: 30.6 pg (ref 26.0–34.0)
MCHC: 34.9 g/dL (ref 30.0–36.0)
MCV: 87.6 fL (ref 78.0–100.0)
PLATELETS: 128 10*3/uL — AB (ref 150–400)
RBC: 4.35 MIL/uL (ref 3.87–5.11)
RDW: 12.9 % (ref 11.5–15.5)
WBC: 4.7 10*3/uL (ref 4.0–10.5)

## 2015-05-26 LAB — BASIC METABOLIC PANEL
ANION GAP: 8 (ref 5–15)
BUN: 13 mg/dL (ref 6–20)
CHLORIDE: 106 mmol/L (ref 101–111)
CO2: 25 mmol/L (ref 22–32)
Calcium: 9.2 mg/dL (ref 8.9–10.3)
Creatinine, Ser: 0.71 mg/dL (ref 0.44–1.00)
Glucose, Bld: 134 mg/dL — ABNORMAL HIGH (ref 65–99)
POTASSIUM: 3.3 mmol/L — AB (ref 3.5–5.1)
SODIUM: 139 mmol/L (ref 135–145)

## 2015-05-26 LAB — D-DIMER, QUANTITATIVE (NOT AT ARMC): D DIMER QUANT: 0.63 ug{FEU}/mL — AB (ref 0.00–0.50)

## 2015-05-26 LAB — TROPONIN I: Troponin I: <0.03 ng/mL (ref <0.031)

## 2015-05-26 MED ORDER — ALBUTEROL SULFATE (2.5 MG/3ML) 0.083% IN NEBU
5.0000 mg | INHALATION_SOLUTION | Freq: Once | RESPIRATORY_TRACT | Status: AC
  Administered 2015-05-26: 5 mg via RESPIRATORY_TRACT
  Filled 2015-05-26: qty 6

## 2015-05-26 MED ORDER — IOHEXOL 350 MG/ML SOLN
100.0000 mL | Freq: Once | INTRAVENOUS | Status: AC | PRN
  Administered 2015-05-26: 100 mL via INTRAVENOUS

## 2015-05-26 MED ORDER — ALBUTEROL SULFATE HFA 108 (90 BASE) MCG/ACT IN AERS: 2.0000 | INHALATION_SPRAY | Freq: Four times a day (QID) | RESPIRATORY_TRACT | Status: DC | PRN

## 2015-05-26 NOTE — ED Notes (Signed)
Patient ambulated around nurses station x2 without difficulty. Oxygen sats maintained between 96% and 100%.

## 2015-05-26 NOTE — Discharge Instructions (Signed)
Shortness of Breath There is no evidence of heart attack, blood clot in the lung or pneumonia. Stop smoking. Follow up with your doctor. Return to the ED if you develop new or worsening symptoms. Shortness of breath means you have trouble breathing. It could also mean that you have a medical problem. You should get immediate medical care for shortness of breath. CAUSES   Not enough oxygen in the air such as with high altitudes or a smoke-filled room.  Certain lung diseases, infections, or problems.  Heart disease or conditions, such as angina or heart failure.  Low red blood cells (anemia).  Poor physical fitness, which can cause shortness of breath when you exercise.  Chest or back injuries or stiffness.  Being overweight.  Smoking.  Anxiety, which can make you feel like you are not getting enough air. DIAGNOSIS  Serious medical problems can often be found during your physical exam. Tests may also be done to determine why you are having shortness of breath. Tests may include:  Chest X-rays.  Lung function tests.  Blood tests.  An electrocardiogram (ECG).  An ambulatory electrocardiogram. An ambulatory ECG records your heartbeat patterns over a 24-hour period.  Exercise testing.  A transthoracic echocardiogram (TTE). During echocardiography, sound waves are used to evaluate how blood flows through your heart.  A transesophageal echocardiogram (TEE).  Imaging scans. Your health care provider may not be able to find a cause for your shortness of breath after your exam. In this case, it is important to have a follow-up exam with your health care provider as directed.  TREATMENT  Treatment for shortness of breath depends on the cause of your symptoms and can vary greatly. HOME CARE INSTRUCTIONS   Do not smoke. Smoking is a common cause of shortness of breath. If you smoke, ask for help to quit.  Avoid being around chemicals or things that may bother your breathing, such  as paint fumes and dust.  Rest as needed. Slowly resume your usual activities.  If medicines were prescribed, take them as directed for the full length of time directed. This includes oxygen and any inhaled medicines.  Keep all follow-up appointments as directed by your health care provider. SEEK MEDICAL CARE IF:   Your condition does not improve in the time expected.  You have a hard time doing your normal activities even with rest.  You have any new symptoms. SEEK IMMEDIATE MEDICAL CARE IF:   Your shortness of breath gets worse.  You feel light-headed, faint, or develop a cough not controlled with medicines.  You start coughing up blood.  You have pain with breathing.  You have chest pain or pain in your arms, shoulders, or abdomen.  You have a fever.  You are unable to walk up stairs or exercise the way you normally do. MAKE SURE YOU:  Understand these instructions.  Will watch your condition.  Will get help right away if you are not doing well or get worse.   This information is not intended to replace advice given to you by your health care provider. Make sure you discuss any questions you have with your health care provider.   Document Released: 01/10/2001 Document Revised: 04/22/2013 Document Reviewed: 07/03/2011 Elsevier Interactive Patient Education [employer].

## 2015-05-26 NOTE — ED Notes (Addendum)
Pt reports mid sternal chest tightness and SOB that began earlier today. Pt also reports intermittent, non-productive cough.

## 2015-05-26 NOTE — ED Provider Notes (Signed)
CSN: [id number]     Arrival date & time 05/26/15  1633 History   First MD Initiated Contact with Patient 05/26/15 1727     Chief Complaint  Patient presents with  . Shortness of Breath  . Chest Pain     (Consider location/radiation/quality/duration/timing/severity/associated sxs/prior Treatment) HPI Comments: Patient reports central chest tightness that is intermittent lasting a few seconds at a time. She has constant shortness of breath that began around 9 AM this morning and is constant. She's used her inhaler about 5 times today. Denies any history of asthma or COPD but does smoke. States she uses her inhaler when necessary for "bronchitis". She does have a nonproductive cough today. No runny nose or sore throat. Some chest soreness with coughing. No leg pain or leg swelling. No birth control use. Denies any history of heart problems.  Patient is a 40 y.o. female presenting with chest pain. The history is provided by the patient.  Chest Pain Associated symptoms: cough and shortness of breath   Associated symptoms: no abdominal pain, no back pain, no dizziness, no fatigue, no fever, no headache, no nausea, not vomiting and no weakness     Past Medical History  Diagnosis Date  . Depressive disorder, not elsewhere classified   . Neck pain   . Vaginitis     cyctitis  . Miscarriage 2009  . Anemia   . Anxiety   . Migraine   . Hypertension    Past Surgical History  Procedure Laterality Date  . Cervical cryotherapy N/A 1999  . Laparoscopic bilateral salpingectomy Bilateral 08/26/2013    Procedure: LAPAROSCOPIC BILATERAL SALPINGECTOMY AND REMOVAL OF LEFT PERITUBAL CYST Procedure #1;  Surgeon: Jonnie Kind, MD;  Location: [facility];  Service: Gynecology;  Laterality: Bilateral;  . Dilitation & currettage/hystroscopy with thermachoice ablation  08/26/2013    Procedure: DILATATION & CURETTAGE/HYSTEROSCOPY WITH THERMACHOICE ENDOMETRIAL ABLATION Procedure #2 Total Therapy Time=min       sec;   Surgeon: Jonnie Kind, MD;  Location: [facility];  Service: Gynecology;;  . Vulvar lesion removal  08/26/2013    Procedure: REMOVAL OF VULVAR SEBACEOUS CYST Procedure #3;  Surgeon: Jonnie Kind, MD;  Location: [facility];  Service: Gynecology;;  . Laparoscopic lysis of adhesions  08/26/2013    Procedure: LAPAROSCOPIC LYSIS OF ADHESIONS Procedure #1;  Surgeon: Jonnie Kind, MD;  Location: [facility];  Service: Gynecology;;   Family History  Problem Relation Age of Onset  . Drug abuse Mother   . Hypertension Father   . Hypertension Maternal Grandmother   . Hypertension Maternal Grandfather   . Hypertension Paternal Grandmother    Social History  Substance Use Topics  . Smoking status: Light Tobacco Smoker -- 0.25 packs/day    Types: Cigars  . Smokeless tobacco: Never Used  . Alcohol Use: Yes     Comment: occasional   OB History    No data available     Review of Systems  Constitutional: Positive for activity change and appetite change. Negative for fever and fatigue.  HENT: Negative for congestion and rhinorrhea.   Respiratory: Positive for cough and shortness of breath. Negative for chest tightness.   Cardiovascular: Positive for chest pain.  Gastrointestinal: Negative for nausea, vomiting and abdominal pain.  Genitourinary: Negative for dysuria, hematuria, vaginal bleeding and vaginal discharge.  Musculoskeletal: Negative for myalgias, back pain, arthralgias and neck pain.  Skin: Negative for wound.  Neurological: Negative for dizziness, weakness and headaches.  A complete 10 system review of  systems was obtained and all systems are negative except as noted in the HPI and PMH.      Allergies  Diphenhydramine hcl; Orange fruit; and Latex  Home Medications   Prior to Admission medications   Medication Sig Start Date End Date Taking? Authorizing Provider  ALPRAZolam (XANAX) 1 MG tablet TAKE 1 TABLET AT BEDTIME AS NEEDED FOR ANXIETY. Patient taking differently: Take 1 mg by  mouth at bedtime as needed for anxiety. . 05/06/15  Yes Fayrene Helper, MD  amLODipine (NORVASC) 2.5 MG tablet Take 1 tablet (2.5 mg total) by mouth daily. 11/25/14  Yes Fayrene Helper, MD  QUEtiapine (SEROQUEL) 50 MG tablet Take 1 tablet (50 mg total) by mouth at bedtime. 03/17/15  Yes Fayrene Helper, MD  topiramate (TOPAMAX) 50 MG tablet Take 1 tablet (50 mg total) by mouth 2 (two) times daily. 06/04/14  Yes Fayrene Helper, MD  albuterol (PROVENTIL HFA;VENTOLIN HFA) 108 (90 Base) MCG/ACT inhaler Inhale 2 puffs into the lungs every 6 (six) hours as needed for wheezing or shortness of breath. 05/26/15   Ezequiel Essex, MD  cephALEXin (KEFLEX) 500 MG capsule Take 1 capsule (500 mg total) by mouth 3 (three) times daily. Patient not taking: Reported on 05/26/2015 04/29/15   Fayrene Helper, MD  fluconazole (DIFLUCAN) 150 MG tablet Take 1 tablet (150 mg total) by mouth once. After completion of antibiotic Patient not taking: Reported on 05/26/2015 04/29/15   Fayrene Helper, MD  fluticasone [facility]) 50 MCG/ACT nasal spray Place 2 sprays into both nostrils daily. 02/10/15   Merryl Hacker, MD  ibuprofen (ADVIL,MOTRIN) 600 MG tablet One tablet twice daily for 5 days Patient not taking: Reported on 05/26/2015 03/17/15   Fayrene Helper, MD  oxyCODONE-acetaminophen (PERCOCET/ROXICET) 5-325 MG tablet Take 1 tablet by mouth at bedtime. Patient not taking: Reported on 05/26/2015 05/07/15   Fayrene Helper, MD  SUMAtriptan (IMITREX) 100 MG tablet One tablet at headache onset, may repaeat once after 24 hours if headache persists. Maximum of 2 tablets in 24 hour period Patient taking differently: Take 100 mg by mouth every 2 (two) hours as needed for migraine or headache. One tablet at headache onset, may repaeat once after 24 hours if headache persists. Maximum of 2 tablets in 24 hour period 06/04/14   Fayrene Helper, MD  Vitamin D, Ergocalciferol, (DRISDOL) 50000 UNITS CAPS capsule Take  1 capsule (50,000 Units total) by mouth every 7 (seven) days. Patient not taking: Reported on 05/26/2015 11/25/14   Fayrene Helper, MD   BP 142/97 mmHg  Pulse 81  Temp(Src) 98.4 F (36.9 C) (Oral)  Resp 16  Ht 5' (1.524 m)  Wt 154 lb (69.854 kg)  BMI 30.08 kg/m2  SpO2 100%  LMP 04/11/2015 Physical Exam  Constitutional: She is oriented to person, place, and time. She appears well-developed and well-nourished. No distress.  HENT:  Head: Normocephalic and atraumatic.  Mouth/Throat: Oropharynx is clear and moist. No oropharyngeal exudate.  Eyes: Conjunctivae and EOM are normal. Pupils are equal, round, and reactive to light.  Neck: Normal range of motion. Neck supple.  No meningismus.  Cardiovascular: Normal rate, regular rhythm, normal heart sounds and intact distal pulses.   No murmur heard. Pulmonary/Chest: Effort normal and breath sounds normal. No respiratory distress. She has no wheezes. She exhibits tenderness.  Abdominal: Soft. There is no tenderness. There is no rebound and no guarding.  Musculoskeletal: Normal range of motion. She exhibits no edema or tenderness.  Neurological: She is alert and oriented to person, place, and time. No cranial nerve deficit. She exhibits normal muscle tone. Coordination normal.  No ataxia on finger to nose bilaterally. No pronator drift. 5/5 strength throughout. CN 2-12 intact.Equal grip strength. Sensation intact.   Skin: Skin is warm.  Psychiatric: She has a normal mood and affect. Her behavior is normal.  Nursing note and vitals reviewed.   ED Course  Procedures (including critical care time) Labs Review Labs Reviewed  BASIC METABOLIC PANEL - Abnormal; Notable for the following:    Potassium 3.3 (*)    Glucose, Bld 134 (*)    All other components within normal limits  CBC - Abnormal; Notable for the following:    Platelets 128 (*)    All other components within normal limits  D-DIMER, QUANTITATIVE (NOT AT [facility]) - Abnormal; Notable  for the following:    D-Dimer, Quant 0.63 (*)    All other components within normal limits  TROPONIN I    Imaging Review Dg Chest 2 View  05/26/2015  CLINICAL DATA:  Sharp central chest pain radiating to the left breast. EXAM: CHEST  2 VIEW COMPARISON:  02/16/2015 FINDINGS: The heart size and mediastinal contours are within normal limits. Both lungs are clear. The visualized skeletal structures are unremarkable. IMPRESSION: No active cardiopulmonary disease. Electronically Signed   By: Fidela Salisbury M.D.   On: 05/26/2015 16:56   Ct Angio Chest Pe W/cm &/or Wo Cm  05/26/2015  CLINICAL DATA:  Chest pain and shortness of breath beginning this morning. Nonproductive cough. Elevated D-dimer. Initial encounter. EXAM: CT ANGIOGRAPHY CHEST WITH CONTRAST TECHNIQUE: Multidetector CT imaging of the chest was performed using the standard protocol during bolus administration of intravenous contrast. Multiplanar CT image reconstructions and MIPs were obtained to evaluate the vascular anatomy. CONTRAST:  100 mL OMNIPAQUE IOHEXOL 350 MG/ML SOLN COMPARISON:  CT chest 11/27/2007. Single view of the chest earlier today and FINDINGS: No pulmonary embolus is identified. Heart size is normal. There is no axillary, hilar or mediastinal lymphadenopathy. No pleural or pericardial effusion. The lungs are clear. Imaged upper abdomen is unremarkable. No bony abnormality is identified. Review of the MIP images confirms the above findings. IMPRESSION: Negative for pulmonary embolus.  Negative chest CT. Electronically Signed   By: Inge Rise M.D.   On: 05/26/2015 20:04   I have personally reviewed and evaluated these images and lab results as part of my medical decision-making.   EKG Interpretation   Date/Time:  Wednesday May 26 2015 16:41:00 EST Ventricular Rate:  87 PR Interval:  146 QRS Duration: 72 QT Interval:  398 QTC Calculation: 478 R Axis:   79 Text Interpretation:  Sinus rhythm with marked sinus  arrhythmia Otherwise  normal ECG No significant change was found Confirmed by Wyvonnia Dusky  MD,  Dara Beidleman [id number]) on 05/26/2015 6:04:32 PM      MDM   Final diagnoses:  Dyspnea   shortness of breath since this morning with intermittent episodes of tingling in the chest lasting for a few seconds at a time. Lungs are clear without wheezing.  Chest x-ray is negative for pneumonia. She is not wheezing. Nebulizer given at her request.  D-dimer is elevated. CT is negative for PE or any other acute pathology.  Patient ambulatory without desaturation, lungs clear.  Pain is atypical for ACS lasting just a few seconds at a time. Suspect possible bronchitis. Advised smoking cessation. Follow up with PCP, Return precautions discussed.    Ezequiel Essex, MD 05/27/15  0926 

## 2015-06-03 ENCOUNTER — Other Ambulatory Visit: Payer: Self-pay

## 2015-06-03 MED ORDER — OXYCODONE-ACETAMINOPHEN 5-325 MG PO TABS: 1.0000 | ORAL_TABLET | Freq: Every day | ORAL | Status: DC

## 2015-07-02 ENCOUNTER — Other Ambulatory Visit: Payer: Self-pay

## 2015-07-02 MED ORDER — OXYCODONE-ACETAMINOPHEN 5-325 MG PO TABS: 1.0000 | ORAL_TABLET | Freq: Every day | ORAL | Status: DC

## 2015-07-19 ENCOUNTER — Encounter: Payer: Self-pay | Admitting: Family Medicine

## 2015-07-30 ENCOUNTER — Other Ambulatory Visit: Payer: Self-pay

## 2015-07-30 MED ORDER — OXYCODONE-ACETAMINOPHEN 5-325 MG PO TABS: 1.0000 | ORAL_TABLET | Freq: Every day | ORAL | Status: DC

## 2015-08-05 ENCOUNTER — Telehealth: Payer: Self-pay | Admitting: Family Medicine

## 2015-08-05 NOTE — Telephone Encounter (Signed)
Patient is asking for a returned call from the nurse regarding one of her medications

## 2015-08-06 MED ORDER — METRONIDAZOLE 500 MG PO TABS: 500.0000 mg | ORAL_TABLET | Freq: Two times a day (BID) | ORAL | Status: DC

## 2015-08-06 NOTE — Telephone Encounter (Signed)
Called and left message for patient notifying.  

## 2015-08-06 NOTE — Telephone Encounter (Signed)
Pt wanting a refill of flagyl because she is having BV symptoms. States she has it often so knows that's what it is.

## 2015-08-06 NOTE — Telephone Encounter (Signed)
Med sent pls let her know 

## 2015-08-26 ENCOUNTER — Other Ambulatory Visit: Payer: Self-pay

## 2015-08-26 MED ORDER — OXYCODONE-ACETAMINOPHEN 5-325 MG PO TABS: 1.0000 | ORAL_TABLET | Freq: Every day | ORAL | Status: DC

## 2015-09-03 ENCOUNTER — Ambulatory Visit (INDEPENDENT_AMBULATORY_CARE_PROVIDER_SITE_OTHER): Payer: BLUE CROSS/BLUE SHIELD | Admitting: Family Medicine

## 2015-09-03 VITALS — BP 132/82 | HR 80 | Resp 18 | Ht 60.0 in | Wt 151.0 lb

## 2015-09-03 DIAGNOSIS — A499 Bacterial infection, unspecified: Secondary | ICD-10-CM

## 2015-09-03 DIAGNOSIS — G43109 Migraine with aura, not intractable, without status migrainosus: Secondary | ICD-10-CM | POA: Diagnosis not present

## 2015-09-03 DIAGNOSIS — I10 Essential (primary) hypertension: Secondary | ICD-10-CM | POA: Diagnosis not present

## 2015-09-03 DIAGNOSIS — M5441 Lumbago with sciatica, right side: Secondary | ICD-10-CM

## 2015-09-03 DIAGNOSIS — N760 Acute vaginitis: Secondary | ICD-10-CM

## 2015-09-03 DIAGNOSIS — F17209 Nicotine dependence, unspecified, with unspecified nicotine-induced disorders: Secondary | ICD-10-CM

## 2015-09-03 DIAGNOSIS — E663 Overweight: Secondary | ICD-10-CM

## 2015-09-03 DIAGNOSIS — F0630 Mood disorder due to known physiological condition, unspecified: Secondary | ICD-10-CM

## 2015-09-03 DIAGNOSIS — B9689 Other specified bacterial agents as the cause of diseases classified elsewhere: Secondary | ICD-10-CM

## 2015-09-03 MED ORDER — METRONIDAZOLE 0.75 % VA GEL: VAGINAL | Status: DC

## 2015-09-03 MED ORDER — METRONIDAZOLE 500 MG PO TABS: 500.0000 mg | ORAL_TABLET | Freq: Two times a day (BID) | ORAL | Status: DC

## 2015-09-03 MED ORDER — VITAMIN D (ERGOCALCIFEROL) 1.25 MG (50000 UNIT) PO CAPS: 50000.0000 [IU] | ORAL_CAPSULE | ORAL | Status: DC

## 2015-09-03 NOTE — Patient Instructions (Signed)
Physical exam as before  Use OTC oragel on ulcer on roof of mouth  No infection in ears  Chest scans are NORMAL:   Medication will be sent after  You leave to reduce bacterial infections (vaginal)  PLEASE STOP SMOKING ENTIRELY!!!

## 2015-09-03 NOTE — Progress Notes (Signed)
Subjective:    Patient ID: Rebecca Boyd, female    DOB: [birth year], 40 y.o.   MRN: [id number]  HPI   Rebecca Boyd     MRN: [id number]      DOB: 05/18/1975   HPI Rebecca Boyd is here for follow up and re-evaluation of chronic medical conditions, medication management and review of any available recent lab and radiology data.  Preventive health is updated, specifically  Cancer screening and Immunization.   Questions or concerns regarding consultations or procedures which the PT has had in the interim are  Addressed.Hwhen seen in the Ed earlier this yearas concerns about reportedly abnormal CXR, which she states she heard about earlier this year.still smokes , no intrest in committing to quitting yet. Radiology reports reviewed at visit and do not report abnormalities. The PT denies any adverse reactions to current medications since the last visit.  There are no new concerns.  There are no specific complaints   ROS Denies recent fever or chills. Denies sinus pressure, nasal congestion, ear pain or sore throat. Denies chest congestion, productive cough or wheezing. Denies chest pains, palpitations and leg swelling Denies abdominal pain, nausea, vomiting,diarrhea or constipation.   Denies dysuria, frequency, hesitancy or incontinence. Denies joint pain, swelling and limitation in mobility. Denies headaches, seizures, numbness, or tingling. Denies depression, anxiety or insomnia. Denies skin break down or rash.   PE  BP 132/82 mmHg  Pulse 80  Resp 18  Ht 5' (1.524 m)  Wt 151 lb (68.493 kg)  BMI 29.49 kg/m2  SpO2 99%  Patient alert and oriented and in no cardiopulmonary distress.  HEENT: No facial asymmetry, EOMI,   oropharynx pink and moist.  Neck supple no JVD, no mass.  Chest: Clear to auscultation bilaterally.  CVS: S1, S2 no murmurs, no S3.Regular rate.  ABD: Soft non tender.   Ext: No edema  MS: Adequate ROM spine, shoulders, hips and knees.  Skin:  Intact, no ulcerations or rash noted.  Psych: Good eye contact, normal affect. Memory intact not anxious or depressed appearing.  CNS: CN 2-12 intact, power,  normal throughout.no focal deficits noted.   Assessment & Plan   Essential hypertension, benign Controlled, no change in medication DASH diet and commitment to daily physical activity for a minimum of 30 minutes discussed and encouraged, as a part of hypertension management. The importance of attaining a healthy weight is also discussed.  BP/Weight 09/03/2015 05/26/2015 03/17/2015 02/09/2015 01/09/2015 12/23/2014 123456  Systolic BP Q000111Q A999333 0000000 A999333 0000000 [id number]  Diastolic BP 82 97 84 [id number] 93 82 96  Wt. 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 The options available for help with cessation ,the behavioral changes to assist the process, and the option to either gradully reduce usage  Or abruptly stop.is also discussed. Pt is also encouraged to set specific goals in number of cigarettes used daily, as well as to set a quit date.  Number of cigarettes/cigars currently smoking  1 to 2 per week   Back pain Unchanged, controlled  on current med  Overweight (BMI 25.0-29.9) Improved. Patient re-educated about  the importance of commitment to a  minimum of 150 minutes of exercise per week.  The importance of healthy food choices with portion control discussed. Encouraged to start a food diary, count calories and to consider  joining a support group. Sample diet sheets  offered. Goals set by the patient for the next several months.   Weight /BMI 09/03/2015 05/26/2015 03/17/2015  WEIGHT 151 lb 154 lb 148 lb  HEIGHT 5\' 0"  5\' 0"  5\' 0"   BMI 29.49 kg/m2 30.08 kg/m2 28.9 kg/m2    Current exercise per week 90 minutes.   Mood disorder in conditions classified elsewhere Controlled, no change in medication        Review of Systems     Objective:   Physical Exam        Assessment & Plan:

## 2015-09-05 ENCOUNTER — Encounter: Payer: Self-pay | Admitting: Family Medicine

## 2015-09-05 NOTE — Assessment & Plan Note (Signed)
Recurrences are multiple, treat x 1 week with oral med , then twice weekly gel for prevention Reminded pt to avoid douching

## 2015-09-05 NOTE — Assessment & Plan Note (Signed)
Improved. Patient re-educated about  the importance of commitment to a  minimum of 150 minutes of exercise per week.  The importance of healthy food choices with portion control discussed. Encouraged to start a food diary, count calories and to consider  joining a support group. Sample diet sheets offered. Goals set by the patient for the next several months.   Weight /BMI 09/03/2015 05/26/2015 03/17/2015  WEIGHT 151 lb 154 lb 148 lb  HEIGHT 5\' 0"  5\' 0"  5\' 0"   BMI 29.49 kg/m2 30.08 kg/m2 28.9 kg/m2    Current exercise per week 90 minutes.

## 2015-09-05 NOTE — Assessment & Plan Note (Signed)

## 2015-09-05 NOTE — Assessment & Plan Note (Signed)
Controlled, no change in medication DASH diet and commitment to daily physical activity for a minimum of 30 minutes discussed and encouraged, as a part of hypertension management. The importance of attaining a healthy weight is also discussed.  BP/Weight 09/03/2015 05/26/2015 03/17/2015 02/09/2015 01/09/2015 12/23/2014 123456  Systolic BP Q000111Q A999333 0000000 A999333 0000000 [id number] [id number]  Diastolic BP 82 97 84 [id number] 93 82 96  Wt. (Lbs) 151 154 148 145 145 - 146.12  BMI 29.49 30.08 28.9 28.32 28.32 - 28.54

## 2015-09-05 NOTE — Assessment & Plan Note (Signed)
Controlled, no change in medication  

## 2015-09-05 NOTE — Assessment & Plan Note (Signed)
Unchanged, controlled on current med

## 2015-09-05 NOTE — Assessment & Plan Note (Signed)
Marked improvement in frequency and severity

## 2015-09-06 ENCOUNTER — Telehealth: Payer: Self-pay | Admitting: Family Medicine

## 2015-09-06 ENCOUNTER — Other Ambulatory Visit: Payer: Self-pay

## 2015-09-06 DIAGNOSIS — I10 Essential (primary) hypertension: Secondary | ICD-10-CM

## 2015-09-06 MED ORDER — AMLODIPINE BESYLATE 2.5 MG PO TABS: 2.5000 mg | ORAL_TABLET | Freq: Every day | ORAL | Status: DC

## 2015-09-06 NOTE — Telephone Encounter (Signed)
Patient is calling requesting a medication refill on amLODipine (NORVASC) 2.5 MG tablet please advise?

## 2015-09-06 NOTE — Telephone Encounter (Signed)
Medication refilled

## 2015-09-13 ENCOUNTER — Other Ambulatory Visit: Payer: Self-pay

## 2015-09-13 ENCOUNTER — Telehealth: Payer: Self-pay | Admitting: Family Medicine

## 2015-09-13 MED ORDER — ALPRAZOLAM 1 MG PO TABS: ORAL_TABLET | ORAL | Status: DC

## 2015-09-13 NOTE — Telephone Encounter (Signed)
Medication refilled

## 2015-09-13 NOTE — Telephone Encounter (Signed)
Patient is asking for a refill of ALPRAZolam (XANAX) 1 MG tablet to be called to [facility]

## 2015-09-24 ENCOUNTER — Other Ambulatory Visit: Payer: Self-pay

## 2015-09-24 MED ORDER — OXYCODONE-ACETAMINOPHEN 5-325 MG PO TABS: 1.0000 | ORAL_TABLET | Freq: Every day | ORAL | Status: DC

## 2015-10-08 ENCOUNTER — Telehealth: Payer: Self-pay | Admitting: Family Medicine

## 2015-10-08 NOTE — Telephone Encounter (Signed)
Opened in error

## 2015-11-01 ENCOUNTER — Other Ambulatory Visit: Payer: Self-pay

## 2015-11-01 MED ORDER — OXYCODONE-ACETAMINOPHEN 5-325 MG PO TABS: 1.0000 | ORAL_TABLET | Freq: Every day | ORAL | Status: DC

## 2015-11-11 ENCOUNTER — Ambulatory Visit (INDEPENDENT_AMBULATORY_CARE_PROVIDER_SITE_OTHER): Payer: BLUE CROSS/BLUE SHIELD | Admitting: Family Medicine

## 2015-11-11 ENCOUNTER — Other Ambulatory Visit (HOSPITAL_COMMUNITY)
Admission: RE | Admit: 2015-11-11 | Discharge: 2015-11-11 | Disposition: A | Discharge status: Home / Self Care | Payer: BLUE CROSS/BLUE SHIELD | Source: Ambulatory Visit | Attending: Family Medicine | Admitting: Family Medicine

## 2015-11-11 ENCOUNTER — Encounter: Payer: Self-pay | Admitting: Family Medicine

## 2015-11-11 VITALS — BP 118/82 | HR 78 | Resp 16 | Ht 60.0 in | Wt 151.0 lb

## 2015-11-11 DIAGNOSIS — F17208 Nicotine dependence, unspecified, with other nicotine-induced disorders: Secondary | ICD-10-CM

## 2015-11-11 DIAGNOSIS — Z113 Encounter for screening for infections with a predominantly sexual mode of transmission: Secondary | ICD-10-CM | POA: Insufficient documentation

## 2015-11-11 DIAGNOSIS — E559 Vitamin D deficiency, unspecified: Secondary | ICD-10-CM

## 2015-11-11 DIAGNOSIS — Z01419 Encounter for gynecological examination (general) (routine) without abnormal findings: Secondary | ICD-10-CM | POA: Insufficient documentation

## 2015-11-11 DIAGNOSIS — N760 Acute vaginitis: Secondary | ICD-10-CM

## 2015-11-11 DIAGNOSIS — Z124 Encounter for screening for malignant neoplasm of cervix: Secondary | ICD-10-CM

## 2015-11-11 DIAGNOSIS — Z1151 Encounter for screening for human papillomavirus (HPV): Secondary | ICD-10-CM | POA: Diagnosis not present

## 2015-11-11 DIAGNOSIS — I10 Essential (primary) hypertension: Secondary | ICD-10-CM

## 2015-11-11 DIAGNOSIS — L309 Dermatitis, unspecified: Secondary | ICD-10-CM

## 2015-11-11 DIAGNOSIS — D500 Iron deficiency anemia secondary to blood loss (chronic): Secondary | ICD-10-CM

## 2015-11-11 DIAGNOSIS — Z0000 Encounter for general adult medical examination without abnormal findings: Secondary | ICD-10-CM

## 2015-11-11 MED ORDER — ALPRAZOLAM 1 MG PO TABS: ORAL_TABLET | ORAL | Status: DC

## 2015-11-11 MED ORDER — AMLODIPINE BESYLATE 2.5 MG PO TABS: 2.5000 mg | ORAL_TABLET | Freq: Every day | ORAL | Status: DC

## 2015-11-11 MED ORDER — PREDNISONE 5 MG PO TABS: 5.0000 mg | ORAL_TABLET | Freq: Two times a day (BID) | ORAL | Status: DC

## 2015-11-11 NOTE — Patient Instructions (Addendum)
F/u end Novemebr, call if you need me sooner  uith 11 refill only  Pain contract reviewed and signed today  Fasting lipid, chem 7, CBc and TSH and vit D in 4 months  You Can Quit Smoking If you are ready to quit smoking or are thinking about it, congratulations! You have chosen to help yourself be healthier and live longer! There are lots of different ways to quit smoking. Nicotine gum, nicotine patches, a nicotine inhaler, or nicotine nasal spray can help with physical craving. Hypnosis, support groups, and medicines help break the habit of smoking. TIPS TO GET OFF AND STAY OFF CIGARETTES  Learn to predict your moods. Do not let a bad situation be your excuse to have a cigarette. Some situations in your life might tempt you to have a cigarette.  Ask friends and co-workers not to smoke around you.  Make your home smoke-free.  Never have "just one" cigarette. It leads to wanting another and another. Remind yourself of your decision to quit.  On a card, make a list of your reasons for not smoking. Read it at least the same number of times a day as you have a cigarette. Tell yourself everyday, "I do not want to smoke. I choose not to smoke."  Ask someone at home or work to help you with your plan to quit smoking.  Have something planned after you eat or have a cup of coffee. Take a walk or get other exercise to perk you up. This will help to keep you from overeating.  Try a relaxation exercise to calm you down and decrease your stress. Remember, you may be tense and nervous the first two weeks after you quit. This will pass.  Find new activities to keep your hands busy. Play with a pen, coin, or rubber band. Doodle or draw things on paper.  Brush your teeth right after eating. This will help cut down the craving for the taste of tobacco after meals. You can try mouthwash too.  Try gum, breath mints, or diet candy to keep something in your mouth. IF YOU SMOKE AND WANT TO QUIT:  Do not  stock up on cigarettes. Never buy a carton. Wait until one pack is finished before you buy another.  Never carry cigarettes with you at work or at home.  Keep cigarettes as far away from you as possible. Leave them with someone else.  Never carry matches or a lighter with you.  Ask yourself, "Do I need this cigarette or is this just a reflex?"  Bet with someone that you can quit. Put cigarette money in a piggy bank every morning. If you smoke, you give up the money. If you do not smoke, by the end of the week, you keep the money.  Keep trying. It takes 21 days to change a habit!  Talk to your doctor about using medicines to help you quit. These include nicotine replacement gum, lozenges, or skin patches.   This information is not intended to replace advice given to you by your health care provider. Make sure you discuss any questions you have with your health care provider.   Document Released: 02/11/2009 Document Revised: 07/10/2011 Document Reviewed: 02/11/2009 Elsevier Interactive Patient Education [employer].

## 2015-11-11 NOTE — Assessment & Plan Note (Signed)

## 2015-11-11 NOTE — Progress Notes (Signed)
    Rebecca Boyd     MRN: [id number]      DOB: 12-12-1975  HPI: Patient is in for annual physical exam. C/o intermittent blistering rash on trunk and limbs, no specific aggravating factor noted Recent labs, if available are reviewed. Immunization is reviewed , and  updated if needed.   PE: Pleasant  female, alert and oriented x 3, in no cardio-pulmonary distress. Afebrile. HEENT No facial trauma or asymetry. Sinuses non tender.  Extra occullar muscles intact, pupils equally reactive to light. External ears normal, tympanic membranes clear. Oropharynx moist, no exudate. Neck: supple, no adenopathy,JVD or thyromegaly.No bruits.  Chest: Clear to ascultation bilaterally.No crackles or wheezes. Non tender to palpation  Breast: No asymetry,no masses or lumps. No tenderness. No nipple discharge or inversion. No axillary or supraclavicular adenopathy  Cardiovascular system; Heart sounds normal,  S1 and  S2 ,no S3.  No murmur, or thrill. Apical beat not displaced Peripheral pulses normal.  Abdomen: Soft, non tender, no organomegaly or masses. No bruits. Bowel sounds normal. No guarding, tenderness or rebound.  GU: External genitalia normal female genitalia , normal female distribution of hair. No lesions. Urethral meatus normal in size, no  Prolapse, no lesions visibly  Present. Bladder non tender. Vagina pink and moist , with no visible lesions , discharge present . Adequate pelvic support no  cystocele or rectocele noted Cervix pink and appears healthy, no lesions or ulcerations noted, no discharge noted from os Uterus normal size, no adnexal masses, no cervical motion or adnexal tenderness.   Musculoskeletal exam: Full ROM of spine, hips , shoulders and knees. No deformity ,swelling or crepitus noted. No muscle wasting or atrophy.   Neurologic: Cranial nerves 2 to 12 intact. Power, tone ,sensation and reflexes normal throughout. No disturbance in gait. No  tremor.  Skin: Intact, erythematous blistering rash on trunk and extremities, no purulent drainage Pigmentation normal throughout  Psych; Normal mood and affect. Judgement and concentration normal   Assessment & Plan:  Annual physical exam Annual exam as documented. Counseling done  re healthy lifestyle involving commitment to 150 minutes exercise per week, heart healthy diet, and attaining healthy weight.The importance of adequate sleep also discussed. Regular seat belt use and home safety, is also discussed. Changes in health habits are decided on by the patient with goals and time frames  set for achieving them. Immunization and cancer screening needs are specifically addressed at this visit.   Nicotine dependence Patient counseled for approximately 5 minutes regarding the health risks of ongoing nicotine use, specifically all types of cancer, heart disease, stroke and respiratory failure. The options available for help with cessation ,the behavioral changes to assist the process, and the option to either gradully reduce usage  Or abruptly stop.is also discussed. Pt is also encouraged to set specific goals in number of cigarettes used daily, as well as to set a quit date.     Dermatitis Currently experiencing mild outbreak affecting lower extremities. 5 day prednisone course prescribed with 1 refill

## 2015-11-11 NOTE — Assessment & Plan Note (Signed)
Currently experiencing mild outbreak affecting lower extremities. 5 day prednisone course prescribed with 1 refill

## 2015-11-11 NOTE — Assessment & Plan Note (Signed)

## 2015-11-12 LAB — CYTOLOGY - PAP

## 2015-11-12 LAB — CERVICOVAGINAL ANCILLARY ONLY: Wet Prep (BD Affirm): POSITIVE — AB

## 2015-11-17 MED ORDER — METRONIDAZOLE 500 MG PO TABS: 500.0000 mg | ORAL_TABLET | Freq: Two times a day (BID) | ORAL | Status: DC

## 2015-11-17 MED ORDER — FLUCONAZOLE 150 MG PO TABS: 150.0000 mg | ORAL_TABLET | Freq: Once | ORAL | Status: DC

## 2015-11-17 NOTE — Addendum Note (Signed)
Addended by: Eual Fines on: 11/17/2015 10:51 AM   Modules accepted: Orders

## 2015-11-30 ENCOUNTER — Telehealth: Payer: Self-pay | Admitting: Family Medicine

## 2015-11-30 ENCOUNTER — Other Ambulatory Visit: Payer: Self-pay

## 2015-11-30 MED ORDER — ALBUTEROL SULFATE HFA 108 (90 BASE) MCG/ACT IN AERS: 2.0000 | INHALATION_SPRAY | Freq: Four times a day (QID) | RESPIRATORY_TRACT | 0 refills | Status: DC | PRN

## 2015-11-30 NOTE — Telephone Encounter (Signed)
Rebecca Boyd is asking for a refill on her albuterol (PROVENTIL HFA;VENTOLIN HFA) 108 (90 Base) MCG/ACT inhaler called to [facility]

## 2015-11-30 NOTE — Telephone Encounter (Signed)
Refill sent.

## 2015-12-02 ENCOUNTER — Other Ambulatory Visit: Payer: Self-pay

## 2015-12-02 MED ORDER — OXYCODONE-ACETAMINOPHEN 5-325 MG PO TABS: 1.0000 | ORAL_TABLET | Freq: Every day | ORAL | 0 refills | Status: DC

## 2015-12-24 ENCOUNTER — Telehealth: Payer: Self-pay | Admitting: Family Medicine

## 2015-12-24 NOTE — Telephone Encounter (Signed)
Dentist needs to treat the infection, it is unsafe for repeated antibiotics, needs the treating health care professional , her dentist to assume responsibility, pls explain

## 2015-12-24 NOTE — Telephone Encounter (Signed)
Patient is c/o tooth and gum still swelling and hurting, the swelling needs to go down more before they can pull it according to [facility], she is asking for more antibiotics, please advise?

## 2015-12-24 NOTE — Telephone Encounter (Signed)
Called and left message for patient to return call.  

## 2015-12-24 NOTE — Telephone Encounter (Signed)
Patient states that she told her dentist that she was given abt by pcp and was told to ask for an additional course.   Asked patient if she has been taking any anti immflamtories to help with swelling.  She states that she hasn't.  Please advise.

## 2015-12-24 NOTE — Telephone Encounter (Signed)
Patient aware.

## 2015-12-31 ENCOUNTER — Other Ambulatory Visit: Payer: Self-pay

## 2015-12-31 MED ORDER — OXYCODONE-ACETAMINOPHEN 5-325 MG PO TABS: 1.0000 | ORAL_TABLET | Freq: Every day | ORAL | 0 refills | Status: DC

## 2016-01-19 ENCOUNTER — Other Ambulatory Visit: Payer: Self-pay

## 2016-01-19 MED ORDER — OXYCODONE-ACETAMINOPHEN 5-325 MG PO TABS
1.0000 | ORAL_TABLET | Freq: Every day | ORAL | 0 refills | Status: DC
Start: 2016-01-19 — End: 2016-02-25

## 2016-01-20 ENCOUNTER — Ambulatory Visit (HOSPITAL_COMMUNITY)
Admission: RE | Admit: 2016-01-20 | Discharge: 2016-01-20 | Disposition: A | Discharge status: Home / Self Care | Payer: BLUE CROSS/BLUE SHIELD | Source: Ambulatory Visit | Attending: Family Medicine | Admitting: Family Medicine

## 2016-01-20 ENCOUNTER — Encounter: Payer: Self-pay | Admitting: Family Medicine

## 2016-01-20 ENCOUNTER — Ambulatory Visit (INDEPENDENT_AMBULATORY_CARE_PROVIDER_SITE_OTHER): Payer: BLUE CROSS/BLUE SHIELD | Admitting: Family Medicine

## 2016-01-20 VITALS — BP 130/82 | HR 76 | Resp 16 | Ht 60.0 in | Wt 151.0 lb

## 2016-01-20 DIAGNOSIS — A499 Bacterial infection, unspecified: Secondary | ICD-10-CM

## 2016-01-20 DIAGNOSIS — N760 Acute vaginitis: Secondary | ICD-10-CM | POA: Diagnosis not present

## 2016-01-20 DIAGNOSIS — M79604 Pain in right leg: Secondary | ICD-10-CM | POA: Insufficient documentation

## 2016-01-20 DIAGNOSIS — J4520 Mild intermittent asthma, uncomplicated: Secondary | ICD-10-CM

## 2016-01-20 DIAGNOSIS — I10 Essential (primary) hypertension: Secondary | ICD-10-CM

## 2016-01-20 DIAGNOSIS — B9689 Other specified bacterial agents as the cause of diseases classified elsewhere: Secondary | ICD-10-CM

## 2016-01-20 DIAGNOSIS — F17208 Nicotine dependence, unspecified, with other nicotine-induced disorders: Secondary | ICD-10-CM

## 2016-01-20 DIAGNOSIS — M5090 Cervical disc disorder, unspecified, unspecified cervical region: Secondary | ICD-10-CM

## 2016-01-20 DIAGNOSIS — R600 Localized edema: Secondary | ICD-10-CM | POA: Diagnosis not present

## 2016-01-20 MED ORDER — CYCLOBENZAPRINE HCL 10 MG PO TABS
10.0000 mg | ORAL_TABLET | Freq: Every day | ORAL | 0 refills | Status: DC
Start: 2016-01-20 — End: 2016-08-10

## 2016-01-20 MED ORDER — METRONIDAZOLE 0.75 % VA GEL: 1.0000 | Freq: Two times a day (BID) | VAGINAL | 2 refills | Status: DC

## 2016-01-20 NOTE — Assessment & Plan Note (Addendum)
bruise with pain and swelling of right calf x 1 week, still painful, no redness or warmth currently, venous doppler to r/o clot Muscle relaxant added for as needed bedtime use , short term asssuming study is negative, which it is, and patient is aware

## 2016-01-20 NOTE — Assessment & Plan Note (Signed)

## 2016-01-20 NOTE — Assessment & Plan Note (Signed)
Controlled, no change in medication DASH diet and commitment to daily physical activity for a minimum of 30 minutes discussed and encouraged, as a part of hypertension management. The importance of attaining a healthy weight is also discussed.  BP/Weight 01/20/2016 11/11/2015 09/03/2015 05/26/2015 03/17/2015 02/09/2015 0000000  Systolic BP [id number] Q000111Q [id number] 0000000 A999333 0000000  Diastolic BP 82 82 82 97 84 108 93  Wt. (Lbs) 151 151 151 154 148 145 145  BMI 29.49 29.49 29.49 30.08 28.9 28.32 28.32  Some encounter information is confidential and restricted. Go to Review Flowsheets activity to see all data.

## 2016-01-20 NOTE — Assessment & Plan Note (Signed)
Well suppressed with intermittent use of metrogel , continue same

## 2016-01-20 NOTE — Progress Notes (Signed)
   Rebecca Boyd     MRN: [id number]      DOB: Jan 02, 1976   HPI Rebecca Boyd is here with a 1 week h/o right calf pain, started with bruise on calf, no known trauma, swelling and pain persist. Denies cough, or hemoptysis or shortness of breath Not willing to set quit date, counselled Refuses flu vaccine  ROS Denies recent fever or chills. Denies sinus pressure, nasal congestion, ear pain or sore throat. Denies chest congestion, productive cough or wheezing. . Denies headaches, seizures, numbness, or tingling. Denies depression, anxiety or insomnia. Denies skin break down or rash.   PE  BP 130/82   Pulse 76   Resp 16   Ht 5' (1.524 m)   Wt 151 lb (68.5 kg)   SpO2 98%   BMI 29.49 kg/m   Patient alert and oriented and in no cardiopulmonary distress.  HEENT: No facial asymmetry, EOMI,   oropharynx pink and moist.  Neck supple no JVD, no mass.  Chest: Clear to auscultation bilaterally.  CVS: S1, S2 no murmurs, no S3.Regular rate.  .   Ext: No edema, right calf tender and swollen, no warmth or erythema noted, negative Homann's  MS: Adequate ROM spine, shoulders, hips and knees.  Skin: Intact, no ulcerations or rash noted.   CNS: CN 2-12 intact, power,  normal throughout.no focal deficits noted.   Assessment & Plan  Leg pain, right bruise with pain and swelling of right calf x 1 week, still painful, no redness or warmth currently, venous doppler to r/o clot Muscle relaxant added for as needed bedtime use , short term asssuming study is negative, which it is, and patient is aware  BV (bacterial vaginosis) Well suppressed with intermittent use of metrogel , continue same  Nicotine dependence Patient counseled for approximately 5 minutes regarding the health risks of ongoing nicotine use, specifically all types of cancer, heart disease, stroke and respiratory failure. The options available for help with cessation ,the behavioral changes to assist the process, and  the option to either gradully reduce usage  Or abruptly stop.is also discussed. Pt is also encouraged to set specific goals in number of cigarettes used daily, as well as to set a quit date.     Essential hypertension, benign Controlled, no change in medication DASH diet and commitment to daily physical activity for a minimum of 30 minutes discussed and encouraged, as a part of hypertension management. The importance of attaining a healthy weight is also discussed.  BP/Weight 01/20/2016 11/11/2015 09/03/2015 05/26/2015 03/17/2015 02/09/2015 0000000  Systolic BP [id number] Q000111Q A999333 0000000 A999333 0000000  Diastolic BP 82 82 82 97 84 108 93  Wt. (Lbs) 151 151 151 154 148 145 145  BMI 29.49 29.49 29.49 30.08 28.9 28.32 28.32  Some encounter information is confidential and restricted. Go to Review Flowsheets activity to see all data.       Asthma, mild intermittent Controlled, no change in medication   Cervical neck pain with evidence of disc disease Continue chronic management

## 2016-01-20 NOTE — Assessment & Plan Note (Signed)
Controlled, no change in medication  

## 2016-01-20 NOTE — Patient Instructions (Addendum)
F/u as before, call if you need me sooner  You need an [place] of right calf to ensure no clot in leg ordered for today, confirm appointmet at checkout  Muscle relaxant sent for bedtime use as needed for calf pain  Gel prescribed as requested , glad it is working for you  Smoking Cessation, Tips for Success If you are ready to quit smoking, congratulations! You have chosen to help yourself be healthier. Cigarettes bring nicotine, tar, carbon monoxide, and other irritants into your body. Your lungs, heart, and blood vessels will be able to work better without these poisons. There are many different ways to quit smoking. Nicotine gum, nicotine patches, a nicotine inhaler, or nicotine nasal spray can help with physical craving. Hypnosis, support groups, and medicines help break the habit of smoking. WHAT THINGS CAN I DO TO MAKE QUITTING EASIER?  Here are some tips to help you quit for good:  Pick a date when you will quit smoking completely. Tell all of your friends and family about your plan to quit on that date.  Do not try to slowly cut down on the number of cigarettes you are smoking. Pick a quit date and quit smoking completely starting on that day.  Throw away all cigarettes.   Clean and remove all ashtrays from your home, work, and car.  On a card, write down your reasons for quitting. Carry the card with you and read it when you get the urge to smoke.  Cleanse your body of nicotine. Drink enough water and fluids to keep your urine clear or pale yellow. Do this after quitting to flush the nicotine from your body.  Learn to predict your moods. Do not let a bad situation be your excuse to have a cigarette. Some situations in your life might tempt you into wanting a cigarette.  Never have "just one" cigarette. It leads to wanting another and another. Remind yourself of your decision to quit.  Change habits associated with smoking. If you smoked while driving or when feeling stressed, try  other activities to replace smoking. Stand up when drinking your coffee. Brush your teeth after eating. Sit in a different chair when you read the paper. Avoid alcohol while trying to quit, and try to drink fewer caffeinated beverages. Alcohol and caffeine may urge you to smoke.  Avoid foods and drinks that can trigger a desire to smoke, such as sugary or spicy foods and alcohol.  Ask people who smoke not to smoke around you.  Have something planned to do right after eating or having a cup of coffee. For example, plan to take a walk or exercise.  Try a relaxation exercise to calm you down and decrease your stress. Remember, you may be tense and nervous for the first 2 weeks after you quit, but this will pass.  Find new activities to keep your hands busy. Play with a pen, coin, or rubber band. Doodle or draw things on paper.  Brush your teeth right after eating. This will help cut down on the craving for the taste of tobacco after meals. You can also try mouthwash.   Use oral substitutes in place of cigarettes. Try using lemon drops, carrots, cinnamon sticks, or chewing gum. Keep them handy so they are available when you have the urge to smoke.  When you have the urge to smoke, try deep breathing.  Designate your home as a nonsmoking area.  If you are a heavy smoker, ask your health care provider about  a prescription for nicotine chewing gum. It can ease your withdrawal from nicotine.  Reward yourself. Set aside the cigarette money you save and buy yourself something nice.  Look for support from others. Join a support group or smoking cessation program. Ask someone at home or at work to help you with your plan to quit smoking.  Always ask yourself, "Do I need this cigarette or is this just a reflex?" Tell yourself, "Today, I choose not to smoke," or "I do not want to smoke." You are reminding yourself of your decision to quit.  Do not replace cigarette smoking with electronic cigarettes  (commonly called e-cigarettes). The safety of e-cigarettes is unknown, and some may contain harmful chemicals.  If you relapse, do not give up! Plan ahead and think about what you will do the next time you get the urge to smoke. HOW WILL I FEEL WHEN I QUIT SMOKING? You may have symptoms of withdrawal because your body is used to nicotine (the addictive substance in cigarettes). You may crave cigarettes, be irritable, feel very hungry, cough often, get headaches, or have difficulty concentrating. The withdrawal symptoms are only temporary. They are strongest when you first quit but will go away within 10-14 days. When withdrawal symptoms occur, stay in control. Think about your reasons for quitting. Remind yourself that these are signs that your body is healing and getting used to being without cigarettes. Remember that withdrawal symptoms are easier to treat than the major diseases that smoking can cause.  Even after the withdrawal is over, expect periodic urges to smoke. However, these cravings are generally short lived and will go away whether you smoke or not. Do not smoke! WHAT RESOURCES ARE AVAILABLE TO HELP ME QUIT SMOKING? Your health care provider can direct you to community resources or hospitals for support, which may include:  Group support.  Education.  Hypnosis.  Therapy.   This information is not intended to replace advice given to you by your health care provider. Make sure you discuss any questions you have with your health care provider.   Document Released: 01/14/2004 Document Revised: 05/08/2014 Document Reviewed: 10/03/2012 Elsevier Interactive Patient Education [employer].

## 2016-01-20 NOTE — Assessment & Plan Note (Signed)
Continue chronic management

## 2016-01-25 ENCOUNTER — Encounter (HOSPITAL_COMMUNITY): Payer: Self-pay

## 2016-01-25 ENCOUNTER — Emergency Department (HOSPITAL_COMMUNITY)
Admission: EM | Admit: 2016-01-25 | Discharge: 2016-01-26 | Disposition: A | Discharge status: Home / Self Care | Payer: BLUE CROSS/BLUE SHIELD | Attending: Emergency Medicine | Admitting: Emergency Medicine

## 2016-01-25 DIAGNOSIS — I10 Essential (primary) hypertension: Secondary | ICD-10-CM | POA: Insufficient documentation

## 2016-01-25 DIAGNOSIS — N946 Dysmenorrhea, unspecified: Secondary | ICD-10-CM | POA: Insufficient documentation

## 2016-01-25 DIAGNOSIS — F17290 Nicotine dependence, other tobacco product, uncomplicated: Secondary | ICD-10-CM | POA: Insufficient documentation

## 2016-01-25 DIAGNOSIS — N939 Abnormal uterine and vaginal bleeding, unspecified: Secondary | ICD-10-CM | POA: Insufficient documentation

## 2016-01-25 DIAGNOSIS — N920 Excessive and frequent menstruation with regular cycle: Secondary | ICD-10-CM | POA: Diagnosis present

## 2016-01-25 LAB — POC URINE PREG, ED: PREG TEST UR: NEGATIVE

## 2016-01-25 NOTE — ED Triage Notes (Signed)
Pt states she found out she was pregnant, yesterday, and today began experiencing "menstrual like cramps" and bleeding. At first bleeding was minimal, spotting which was bright at first bleeding as progressively worsened and is now dark red with "clots".

## 2016-01-26 LAB — CBC WITH DIFFERENTIAL/PLATELET
Basophils Absolute: 0 10*3/uL (ref 0.0–0.1)
Basophils Relative: 0 %
EOS PCT: 3 %
Eosinophils Absolute: 0.1 10*3/uL (ref 0.0–0.7)
HEMATOCRIT: 38.9 % (ref 36.0–46.0)
Hemoglobin: 13.6 g/dL (ref 12.0–15.0)
LYMPHS ABS: 1.5 10*3/uL (ref 0.7–4.0)
LYMPHS PCT: 31 %
MCH: 30.2 pg (ref 26.0–34.0)
MCHC: 35 g/dL (ref 30.0–36.0)
MCV: 86.4 fL (ref 78.0–100.0)
MONO ABS: 0.4 10*3/uL (ref 0.1–1.0)
MONOS PCT: 8 %
NEUTROS ABS: 2.9 10*3/uL (ref 1.7–7.7)
Neutrophils Relative %: 58 %
PLATELETS: 149 10*3/uL — AB (ref 150–400)
RBC: 4.5 MIL/uL (ref 3.87–5.11)
RDW: 12.6 % (ref 11.5–15.5)
WBC: 4.9 10*3/uL (ref 4.0–10.5)

## 2016-01-26 LAB — URINE MICROSCOPIC-ADD ON

## 2016-01-26 LAB — URINALYSIS, ROUTINE W REFLEX MICROSCOPIC
Bilirubin Urine: NEGATIVE
GLUCOSE, UA: NEGATIVE mg/dL
KETONES UR: NEGATIVE mg/dL
LEUKOCYTES UA: NEGATIVE
Nitrite: NEGATIVE
PH: 6 (ref 5.0–8.0)
PROTEIN: NEGATIVE mg/dL
Specific Gravity, Urine: 1.02 (ref 1.005–1.030)

## 2016-01-26 LAB — BASIC METABOLIC PANEL
Anion gap: 8 (ref 5–15)
BUN: 12 mg/dL (ref 6–20)
CALCIUM: 9.2 mg/dL (ref 8.9–10.3)
CO2: 24 mmol/L (ref 22–32)
Chloride: 103 mmol/L (ref 101–111)
Creatinine, Ser: 0.76 mg/dL (ref 0.44–1.00)
GFR calc Af Amer: >60 mL/min (ref >60)
GLUCOSE: 89 mg/dL (ref 65–99)
Potassium: 3.2 mmol/L — ABNORMAL LOW (ref 3.5–5.1)
Sodium: 135 mmol/L (ref 135–145)

## 2016-01-26 LAB — ABO/RH: ABO/RH(D): B POS

## 2016-01-26 LAB — HCG, QUANTITATIVE, PREGNANCY: hCG, Beta Chain, Quant, S: <1 m[IU]/mL (ref <5)

## 2016-01-26 MED ORDER — HYDROCODONE-ACETAMINOPHEN 5-325 MG PO TABS
1.0000 | ORAL_TABLET | Freq: Once | ORAL | Status: AC
  Administered 2016-01-26: 1 via ORAL
  Filled 2016-01-26: qty 1

## 2016-01-26 MED ORDER — NAPROXEN 500 MG PO TABS: 500.0000 mg | ORAL_TABLET | Freq: Two times a day (BID) | ORAL | 0 refills | Status: DC | PRN

## 2016-01-26 NOTE — ED Provider Notes (Signed)
**Note Rebecca-Identified via Obfuscation** Rebecca Boyd Provider Note   CSN: [id number] Arrival date & time: 01/25/16  2311     History   Chief Complaint Chief Complaint  Patient presents with  . Vaginal Bleeding    HPI DARBI [name] is a 40 y.o. female whose LMP was the first week of August who took a home pregnancy test yesterday which was positive. Today while at work this am she started spotting which progressed to a heavy period including passage of clots.  She states she has used about 4 pads today with her bleeding.  She is a G1P0 female, endorses had a spontaneous miscarriage in 2009.  She denies weakness, dizziness, abdominal pain but has increased menstrual cramping.  She has also had no fevers, chills, nausea or vomiting, no dysuria.  She has had no treatments prior to arrival.  The history is provided by the patient.    Past Medical History:  Diagnosis Date  . Anemia   . Anxiety   . Depressive disorder, not elsewhere classified   . Hypertension   . Migraine   . Miscarriage 2009  . Neck pain   . Vaginitis    cyctitis    Patient Active Problem List   Diagnosis Date Noted  . Dermatitis 11/11/2015  . Mood disorder in conditions classified elsewhere 03/17/2015  . Overweight (BMI 25.0-29.9) 03/17/2015  . Back pain 08/30/2014  . Nicotine dependence 08/30/2014  . BV (bacterial vaginosis) 07/27/2014  . Essential hypertension, benign 01/07/2014  . Cervical neck pain with evidence of disc disease 10/02/2013  . 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 Migraine headache 11/14/2012  . Asthma, mild intermittent 11/14/2012  . Vitamin D deficiency 10/27/2010  . Leg pain, right 10/26/2010  . Anxiety 10/26/2010  . Iron deficiency anemia due to chronic blood loss 03/07/2010    Past Surgical History:  Procedure Laterality Date  . cervical cryotherapy N/A 1999  . DILITATION & CURRETTAGE/HYSTROSCOPY WITH THERMACHOICE ABLATION  08/26/2013   Procedure: DILATATION & CURETTAGE/HYSTEROSCOPY WITH THERMACHOICE  ENDOMETRIAL ABLATION Procedure #2 Total Therapy Time=min       sec;  Surgeon: Jonnie Kind, MD;  Location: [facility];  Service: Gynecology;;  . LAPAROSCOPIC BILATERAL SALPINGECTOMY Bilateral 08/26/2013   Procedure: LAPAROSCOPIC BILATERAL SALPINGECTOMY AND REMOVAL OF LEFT PERITUBAL CYST Procedure #1;  Surgeon: Jonnie Kind, MD;  Location: [facility];  Service: Gynecology;  Laterality: Bilateral;  . LAPAROSCOPIC LYSIS OF ADHESIONS  08/26/2013   Procedure: LAPAROSCOPIC LYSIS OF ADHESIONS Procedure #1;  Surgeon: Jonnie Kind, MD;  Location: [facility];  Service: Gynecology;;  . Carmelia Bake LESION REMOVAL  08/26/2013   Procedure: REMOVAL OF VULVAR SEBACEOUS CYST Procedure #3;  Surgeon: Jonnie Kind, MD;  Location: [facility];  Service: Gynecology;;    OB History    No data available       Home Medications    Prior to Admission medications   Medication Sig Start Date End Date Taking? Authorizing Provider  albuterol (PROVENTIL HFA;VENTOLIN HFA) 108 (90 Base) MCG/ACT inhaler Inhale 2 puffs into the lungs every 6 (six) hours as needed for wheezing or shortness of breath. 11/30/15   Fayrene Helper, MD  ALPRAZolam Duanne Moron) 1 MG tablet TAKE 1 TABLET AT BEDTIME AS NEEDED FOR ANXIETY. 11/11/15   Fayrene Helper, MD  amLODipine (NORVASC) 2.5 MG tablet Take 1 tablet (2.5 mg total) by mouth daily. 11/11/15   Fayrene Helper, MD  cyclobenzaprine (FLEXERIL) 10 MG tablet Take 1 tablet (10 mg total) by mouth at bedtime. 01/20/16  Fayrene Helper, MD  fluticasone [facility]) 50 MCG/ACT nasal spray Place 2 sprays into both nostrils daily. 02/10/15   Merryl Hacker, MD  metroNIDAZOLE (METROGEL) 0.75 % vaginal gel Place 1 Applicatorful vaginally 2 (two) times daily. 01/20/16 01/27/16  Fayrene Helper, MD  naproxen (NAPROSYN) 500 MG tablet Take 1 tablet (500 mg total) by mouth 2 (two) times daily as needed for moderate pain (menstrual cramping). 01/26/16   Evalee Jefferson, PA-C  oxyCODONE-acetaminophen (PERCOCET/ROXICET)  5-325 MG tablet Take 1 tablet by mouth at bedtime. 01/19/16   Fayrene Helper, MD  Vitamin D, Ergocalciferol, (DRISDOL) 50000 units CAPS capsule Take 1 capsule (50,000 Units total) by mouth every 7 (seven) days. 09/03/15   Fayrene Helper, MD    Family History Family History  Problem Relation Age of Onset  . Drug abuse Mother   . Hypertension Father   . Hypertension Maternal Grandmother   . Hypertension Maternal Grandfather   . Hypertension Paternal Grandmother     Social History Social History  Substance Use Topics  . Smoking status: Light Tobacco Smoker    Packs/day: 0.25    Types: Cigars  . Smokeless tobacco: Never Used  . Alcohol use Yes     Comment: occasional     Allergies   Diphenhydramine hcl; Orange fruit; and Latex   Review of Systems Review of Systems  Constitutional: Negative for fever.  HENT: Negative for congestion and sore throat.   Eyes: Negative.   Respiratory: Negative for chest tightness and shortness of breath.   Cardiovascular: Negative for chest pain.  Gastrointestinal: Negative for abdominal pain and nausea.  Genitourinary: Positive for menstrual problem and vaginal bleeding.  Musculoskeletal: Negative for arthralgias, joint swelling and neck pain.  Skin: Negative.  Negative for rash and wound.  Neurological: Negative for dizziness, weakness, light-headedness, numbness and headaches.  Psychiatric/Behavioral: Negative.      Physical Exam Updated Vital Signs BP 142/81   Pulse 73   Temp 98.6 F (37 C) (Oral)   Resp 17   Ht 5' (1.524 m)   Wt 68.5 kg   SpO2 100%   BMI 29.49 kg/m   Physical Exam  Constitutional: She appears well-developed and well-nourished.  HENT:  Head: Normocephalic and atraumatic.  Eyes: Conjunctivae are normal.  Neck: Normal range of motion.  Cardiovascular: Normal rate, regular rhythm, normal heart sounds and intact distal pulses.   Pulmonary/Chest: Effort normal and breath sounds normal. She has no wheezes.    Abdominal: Soft. Bowel sounds are normal. She exhibits no distension. There is no tenderness. There is no guarding.  Musculoskeletal: Normal range of motion.  Neurological: She is alert.  Skin: Skin is warm and dry.  Psychiatric: She has a normal mood and affect.  Nursing note and vitals reviewed.    ED Treatments / Results  Labs (all labs ordered are listed, but only abnormal results are displayed) Labs Reviewed  CBC WITH DIFFERENTIAL/PLATELET - Abnormal; Notable for the following:       Result Value   Platelets 149 (*)    All other components within normal limits  BASIC METABOLIC PANEL - Abnormal; Notable for the following:    Potassium 3.2 (*)    All other components within normal limits  URINALYSIS, ROUTINE W REFLEX MICROSCOPIC (NOT AT [facility]) - Abnormal; Notable for the following:    Hgb urine dipstick LARGE (*)    All other components within normal limits  URINE MICROSCOPIC-ADD ON - Abnormal; Notable for the following:  Squamous Epithelial / LPF TOO NUMEROUS TO COUNT (*)    Bacteria, UA FEW (*)    All other components within normal limits  HCG, QUANTITATIVE, PREGNANCY  POC URINE PREG, ED  ABO/RH    EKG  EKG Interpretation None       Radiology No results found.  Procedures Procedures (including critical care time)  Medications Ordered in ED Medications  HYDROcodone-acetaminophen (NORCO/VICODIN) 5-325 MG per tablet 1 tablet (1 tablet Oral Given 01/26/16 0111)     Initial Impression / Assessment and Plan / ED Course  I have reviewed the triage vital signs and the nursing notes.  Pertinent labs & imaging results that were available during my care of the patient were reviewed by me and considered in my medical decision making (see chart for details).  Clinical Course    Pt with h/o surgical salpingectomy, doubt possible pregnancy.  Pt insistent home test positive.  Labs performed here including beta quant hcg negative.  Suspect pt is having a heavier than  normal period secondary to DUB/ cycle disruption.  Lab results discussed with pt.  She was advised f/u with pcp prn if sx persist or worsen.  Final Clinical Impressions(s) / ED Diagnoses   Final diagnoses:  Dysmenorrhea    New Prescriptions Discharge Medication List as of 01/26/2016  1:14 AM    START taking these medications   Details  naproxen (NAPROSYN) 500 MG tablet Take 1 tablet (500 mg total) by mouth 2 (two) times daily as needed for moderate pain (menstrual cramping)., Starting Wed 01/26/2016, Print         Evalee Jefferson, PA-C 01/26/16 [id number]    Rolland Porter, MD 01/26/16 2256

## 2016-02-17 ENCOUNTER — Other Ambulatory Visit: Payer: Self-pay

## 2016-02-17 MED ORDER — ALBUTEROL SULFATE HFA 108 (90 BASE) MCG/ACT IN AERS: 2.0000 | INHALATION_SPRAY | Freq: Four times a day (QID) | RESPIRATORY_TRACT | 0 refills | Status: DC | PRN

## 2016-02-25 ENCOUNTER — Other Ambulatory Visit: Payer: Self-pay

## 2016-02-25 MED ORDER — OXYCODONE-ACETAMINOPHEN 5-325 MG PO TABS: 1.0000 | ORAL_TABLET | Freq: Every day | ORAL | 0 refills | Status: DC

## 2016-03-01 ENCOUNTER — Other Ambulatory Visit: Payer: Self-pay

## 2016-03-01 MED ORDER — ALPRAZOLAM 1 MG PO TABS: ORAL_TABLET | ORAL | 1 refills | Status: DC

## 2016-03-16 ENCOUNTER — Ambulatory Visit (INDEPENDENT_AMBULATORY_CARE_PROVIDER_SITE_OTHER): Payer: BLUE CROSS/BLUE SHIELD | Admitting: Family Medicine

## 2016-03-16 ENCOUNTER — Encounter: Payer: Self-pay | Admitting: Family Medicine

## 2016-03-16 VITALS — BP 122/82 | HR 87 | Resp 16 | Ht 60.0 in | Wt 153.0 lb

## 2016-03-16 DIAGNOSIS — M5440 Lumbago with sciatica, unspecified side: Secondary | ICD-10-CM

## 2016-03-16 DIAGNOSIS — F419 Anxiety disorder, unspecified: Secondary | ICD-10-CM

## 2016-03-16 DIAGNOSIS — R519 Headache, unspecified: Secondary | ICD-10-CM

## 2016-03-16 DIAGNOSIS — I10 Essential (primary) hypertension: Secondary | ICD-10-CM | POA: Diagnosis not present

## 2016-03-16 DIAGNOSIS — R040 Epistaxis: Secondary | ICD-10-CM | POA: Diagnosis not present

## 2016-03-16 DIAGNOSIS — R109 Unspecified abdominal pain: Secondary | ICD-10-CM

## 2016-03-16 DIAGNOSIS — G44001 Cluster headache syndrome, unspecified, intractable: Secondary | ICD-10-CM | POA: Diagnosis not present

## 2016-03-16 DIAGNOSIS — R51 Headache: Secondary | ICD-10-CM

## 2016-03-16 DIAGNOSIS — J4520 Mild intermittent asthma, uncomplicated: Secondary | ICD-10-CM

## 2016-03-16 HISTORY — DX: Headache, unspecified: R51.9

## 2016-03-16 LAB — POCT URINALYSIS DIPSTICK
Bilirubin, UA: NEGATIVE
GLUCOSE UA: NEGATIVE
Ketones, UA: NEGATIVE
Leukocytes, UA: NEGATIVE
NITRITE UA: NEGATIVE
PH UA: 6.5
Protein, UA: NEGATIVE
RBC UA: NEGATIVE
Spec Grav, UA: 1.02
UROBILINOGEN UA: 0.2

## 2016-03-16 MED ORDER — PREDNISONE 5 MG (21) PO TBPK: 5.0000 mg | ORAL_TABLET | ORAL | 0 refills | Status: DC

## 2016-03-16 MED ORDER — KETOROLAC TROMETHAMINE 60 MG/2ML IM SOLN
60.0000 mg | Freq: Once | INTRAMUSCULAR | Status: AC
  Administered 2016-03-16: 60 mg via INTRAMUSCULAR

## 2016-03-16 MED ORDER — METHYLPREDNISOLONE ACETATE 80 MG/ML IJ SUSP
80.0000 mg | Freq: Once | INTRAMUSCULAR | Status: AC
  Administered 2016-03-16: 80 mg via INTRAMUSCULAR

## 2016-03-16 MED ORDER — IBUPROFEN 800 MG PO TABS: ORAL_TABLET | ORAL | 1 refills | Status: DC

## 2016-03-16 NOTE — Assessment & Plan Note (Signed)
Bilateral epistaxis x 2 weeks, painless, refer eNT

## 2016-03-16 NOTE — Progress Notes (Signed)
   Rebecca Boyd     MRN: [id number]      DOB: 25-Jul-1975   HPI Rebecca Boyd is here with a 2 week h/o intermittent left flank pain radiating to groin, no fever, cills or urinary symptoms, no visible blood in urine, not aggravated by movement 2 week h/o frontal and periorbital pressure, rated at  10, NEEDS TO LEAVE WORK, DAILY associated with spontaneous nose bleeds from boh nostrils, on avg 3 times per week, several episodes each time  ROS Denies recent fever or chills. Denies sinus pressure, nasal congestion, ear pain or sore throat. Denies chest congestion, productive cough or wheezing. Denies chest pains, palpitations and leg swelling Denies  nausea, vomiting,diarrhea or constipation.   Denies dysuria, frequency, hesitancy or incontinence. Denies joint pain, swelling and limitation in mobility.  Denies skin break down or rash.   PE  BP 122/82   Pulse 87   Resp 16   Ht 5' (1.524 m)   Wt 153 lb (69.4 kg)   SpO2 98%   BMI 29.88 kg/m   Patient alert and oriented and in no cardiopulmonary distress.Anxious  HEENT: No facial asymmetry, EOMI,   oropharynx pink and moist.  Neck supple no JVD, no mass.  Chest: Clear to auscultation bilaterally.  CVS: S1, S2 no murmurs, no S3.Regular rate.  ABD: Soft left renal angle tender, no guarding or rebound, no organomegaly or mass, normal bS.   Ext: No edema  MS: Adequate ROM spine, shoulders, hips and knees.  Skin: Intact, no ulcerations or rash noted.  Psych: Good eye contact, normal affect. Memory intact anxious not depressed appearing.  CNS: CN 2-12 intact, power,  normal throughout.no focal deficits noted.   Assessment & Plan  Headache Uncontrolled.Toradol and depo medrol administered IM in the office , to be followed by a short course of oral prednisone and NSAIDS.   Essential hypertension, benign Controlled, no change in medication DASH diet and commitment to daily physical activity for a minimum of 30 minutes  discussed and encouraged, as a part of hypertension management. The importance of attaining a healthy weight is also discussed.  BP/Weight 03/16/2016 01/26/2016 01/25/2016 01/20/2016 11/11/2015 09/03/2015 [id number]  Systolic BP 123XX123 [id number] - [id number] A999333  Diastolic BP 82 81 - 82 82 82 97  Wt. (Lbs) 153 - 151 151 151 151 154  BMI 29.88 - 29.49 29.49 29.49 29.49 30.08  Some encounter information is confidential and restricted. Go to Review Flowsheets activity to see all data.       Flank pain 2 week h/o left flank pain, normal UA , need [place] to furhter eval  Epistaxis Bilateral epistaxis x 2 weeks, painless, refer eNT  Intractable cluster headache Uncontrolled.Toradol and depo medrol administered IM in the office , to be followed by a short course of oral prednisone and NSAIDS.   Anxiety Controlled, no change in medication   Asthma, mild intermittent Controlled, no change in medication   Back pain Controlled, no change in medication

## 2016-03-16 NOTE — Assessment & Plan Note (Signed)
Controlled, no change in medication DASH diet and commitment to daily physical activity for a minimum of 30 minutes discussed and encouraged, as a part of hypertension management. The importance of attaining a healthy weight is also discussed.  BP/Weight 03/16/2016 01/26/2016 01/25/2016 01/20/2016 11/11/2015 09/03/2015 [id number]  Systolic BP 123XX123 [id number] - [id number] [id number] A999333  Diastolic BP 82 81 - 82 82 82 97  Wt. (Lbs) 153 - 151 151 151 151 154  BMI 29.88 - 29.49 29.49 29.49 29.49 30.08  Some encounter information is confidential and restricted. Go to Review Flowsheets activity to see all data.

## 2016-03-16 NOTE — Assessment & Plan Note (Signed)
Uncontrolled.Toradol and depo medrol administered IM in the office , to be followed by a short course of oral prednisone and NSAIDS.  

## 2016-03-16 NOTE — Patient Instructions (Addendum)
F/u as before, call if you need me sooner  Injections, toradol and depo medrol in office for headache and prednisone for 5 days  Ibuprofen prescribed  to be used maximum of 4 tablets per week  Urine checked in office for blood in urine , there is no blood in urine , an [place] of left kidney/ flank is ordered to further evaluate pain  You are referred toENT to further evaluate new onset of nosebleed  cONGRATS on stopping smoking

## 2016-03-16 NOTE — Assessment & Plan Note (Signed)
2 week h/o left flank pain, normal UA , need [place] to furhter eval

## 2016-03-17 ENCOUNTER — Other Ambulatory Visit: Payer: Self-pay

## 2016-03-17 MED ORDER — OXYCODONE-ACETAMINOPHEN 5-325 MG PO TABS: 1.0000 | ORAL_TABLET | Freq: Every day | ORAL | 0 refills | Status: DC

## 2016-03-20 NOTE — Assessment & Plan Note (Signed)
Controlled, no change in medication  

## 2016-03-20 NOTE — Assessment & Plan Note (Signed)
Uncontrolled.Toradol and depo medrol administered IM in the office , to be followed by a short course of oral prednisone and NSAIDS.  

## 2016-03-21 ENCOUNTER — Ambulatory Visit (HOSPITAL_COMMUNITY)
Admission: RE | Admit: 2016-03-21 | Discharge: 2016-03-21 | Disposition: A | Discharge status: Home / Self Care | Payer: BLUE CROSS/BLUE SHIELD | Source: Ambulatory Visit | Attending: Family Medicine | Admitting: Family Medicine

## 2016-03-21 DIAGNOSIS — R109 Unspecified abdominal pain: Secondary | ICD-10-CM | POA: Diagnosis not present

## 2016-03-21 DIAGNOSIS — N281 Cyst of kidney, acquired: Secondary | ICD-10-CM | POA: Insufficient documentation

## 2016-03-22 ENCOUNTER — Encounter: Payer: Self-pay | Admitting: Family Medicine

## 2016-03-28 ENCOUNTER — Ambulatory Visit: Payer: Self-pay | Admitting: Family Medicine

## 2016-04-06 ENCOUNTER — Other Ambulatory Visit: Payer: Self-pay | Admitting: Family Medicine

## 2016-04-13 ENCOUNTER — Ambulatory Visit (INDEPENDENT_AMBULATORY_CARE_PROVIDER_SITE_OTHER): Payer: BLUE CROSS/BLUE SHIELD | Admitting: Otolaryngology

## 2016-04-13 DIAGNOSIS — R040 Epistaxis: Secondary | ICD-10-CM

## 2016-04-20 ENCOUNTER — Other Ambulatory Visit: Payer: Self-pay

## 2016-04-20 MED ORDER — OXYCODONE-ACETAMINOPHEN 5-325 MG PO TABS: 1.0000 | ORAL_TABLET | Freq: Every day | ORAL | 0 refills | Status: DC

## 2016-04-28 ENCOUNTER — Other Ambulatory Visit: Payer: Self-pay

## 2016-04-28 ENCOUNTER — Telehealth: Payer: Self-pay | Admitting: Family Medicine

## 2016-04-28 MED ORDER — ALPRAZOLAM 1 MG PO TABS: ORAL_TABLET | ORAL | 0 refills | Status: DC

## 2016-04-28 NOTE — Telephone Encounter (Signed)
OPENED IN ERROR

## 2016-05-08 ENCOUNTER — Telehealth: Payer: Self-pay

## 2016-05-08 ENCOUNTER — Other Ambulatory Visit: Payer: Self-pay

## 2016-05-08 MED ORDER — METRONIDAZOLE 0.75 % VA GEL: 1.0000 | Freq: Two times a day (BID) | VAGINAL | 0 refills | Status: DC

## 2016-05-08 NOTE — Telephone Encounter (Signed)
Refill x 1 please , I no relief will need visit

## 2016-05-08 NOTE — Telephone Encounter (Signed)
Med refilled.

## 2016-05-10 ENCOUNTER — Ambulatory Visit (HOSPITAL_COMMUNITY)
Admission: RE | Admit: 2016-05-10 | Discharge: 2016-05-10 | Disposition: A | Discharge status: Home / Self Care | Payer: BLUE CROSS/BLUE SHIELD | Source: Ambulatory Visit | Attending: Family Medicine | Admitting: Family Medicine

## 2016-05-10 ENCOUNTER — Encounter: Payer: Self-pay | Admitting: Family Medicine

## 2016-05-10 ENCOUNTER — Ambulatory Visit (INDEPENDENT_AMBULATORY_CARE_PROVIDER_SITE_OTHER): Payer: BLUE CROSS/BLUE SHIELD | Admitting: Family Medicine

## 2016-05-10 VITALS — BP 122/88 | HR 75 | Resp 16 | Ht 60.0 in | Wt 156.0 lb

## 2016-05-10 DIAGNOSIS — Z87891 Personal history of nicotine dependence: Secondary | ICD-10-CM | POA: Insufficient documentation

## 2016-05-10 DIAGNOSIS — M546 Pain in thoracic spine: Secondary | ICD-10-CM

## 2016-05-10 DIAGNOSIS — E663 Overweight: Secondary | ICD-10-CM

## 2016-05-10 DIAGNOSIS — E559 Vitamin D deficiency, unspecified: Secondary | ICD-10-CM

## 2016-05-10 DIAGNOSIS — R079 Chest pain, unspecified: Secondary | ICD-10-CM | POA: Diagnosis not present

## 2016-05-10 DIAGNOSIS — R0789 Other chest pain: Secondary | ICD-10-CM

## 2016-05-10 DIAGNOSIS — I10 Essential (primary) hypertension: Secondary | ICD-10-CM

## 2016-05-10 DIAGNOSIS — G8929 Other chronic pain: Secondary | ICD-10-CM

## 2016-05-10 DIAGNOSIS — Z1379 Encounter for other screening for genetic and chromosomal anomalies: Secondary | ICD-10-CM | POA: Insufficient documentation

## 2016-05-10 DIAGNOSIS — J4520 Mild intermittent asthma, uncomplicated: Secondary | ICD-10-CM

## 2016-05-10 DIAGNOSIS — Z0289 Encounter for other administrative examinations: Secondary | ICD-10-CM

## 2016-05-10 LAB — COMPREHENSIVE METABOLIC PANEL WITH GFR
ALT: 11 U/L (ref 6–29)
AST: 13 U/L (ref 10–30)
Albumin: 4.1 g/dL (ref 3.6–5.1)
Alkaline Phosphatase: 66 U/L (ref 33–115)
BUN: 11 mg/dL (ref 7–25)
CO2: 27 mmol/L (ref 20–31)
Calcium: 9.7 mg/dL (ref 8.6–10.2)
Chloride: 107 mmol/L (ref 98–110)
Creat: 0.69 mg/dL (ref 0.50–1.10)
Glucose, Bld: 90 mg/dL (ref 65–99)
Potassium: 4.4 mmol/L (ref 3.5–5.3)
Sodium: 142 mmol/L (ref 135–146)
Total Bilirubin: 0.5 mg/dL (ref 0.2–1.2)
Total Protein: 6.8 g/dL (ref 6.1–8.1)

## 2016-05-10 LAB — CBC
HCT: 41.3 % (ref 35.0–45.0)
HEMOGLOBIN: 13.8 g/dL (ref 11.7–15.5)
MCH: 30.2 pg (ref 27.0–33.0)
MCHC: 33.4 g/dL (ref 32.0–36.0)
MCV: 90.4 fL (ref 80.0–100.0)
MPV: 11.8 fL (ref 7.5–12.5)
PLATELETS: 161 10*3/uL (ref 140–400)
RBC: 4.57 MIL/uL (ref 3.80–5.10)
RDW: 13.7 % (ref 11.0–15.0)
WBC: 4.5 10*3/uL (ref 3.8–10.8)

## 2016-05-10 LAB — LIPID PANEL
Cholesterol: 195 mg/dL (ref <200)
HDL: 59 mg/dL (ref >50)
LDL Cholesterol: 117 mg/dL — ABNORMAL HIGH (ref <100)
Total CHOL/HDL Ratio: 3.3 ratio (ref <5.0)
Triglycerides: 96 mg/dL (ref <150)
VLDL: 19 mg/dL (ref <30)

## 2016-05-10 LAB — TSH: TSH: 1.12 m[IU]/L

## 2016-05-10 MED ORDER — OXYCODONE-ACETAMINOPHEN 5-325 MG PO TABS: 1.0000 | ORAL_TABLET | Freq: Every day | ORAL | 0 refills | Status: DC

## 2016-05-10 NOTE — Assessment & Plan Note (Signed)
Controlled with very infrequent flares and no need for prophylactic medication

## 2016-05-10 NOTE — Assessment & Plan Note (Signed)
Chronic  Spine pain worse in thoracic area, pain contract reviewed and new policy implemented where pt receives 3 months of medication, contract reviewd discussed and signed

## 2016-05-10 NOTE — Assessment & Plan Note (Signed)
Deteriorated. Patient re-educated about  the importance of commitment to a  minimum of 150 minutes of exercise per week.  The importance of healthy food choices with portion control discussed. Encouraged to start a food diary, count calories and to consider  joining a support group. Sample diet sheets offered. Goals set by the patient for the next several months.   Weight /BMI 05/10/2016 03/16/2016 01/25/2016  WEIGHT 156 lb 153 lb 151 lb  HEIGHT 5\' 0"  5\' 0"  5\' 0"   BMI 30.47 kg/m2 29.88 kg/m2 29.49 kg/m2  Some encounter information is confidential and restricted. Go to Review Flowsheets activity to see all data.

## 2016-05-10 NOTE — Patient Instructions (Addendum)
F/u in 3 month, call if you need me before  Pain contract as discussed today, and 3 months of medication  Handed to you as discussed  CXR today  You are referred for epidural injection for chronic pain  Pls STAY nicotine free  Good blood pressure  CBC, fasting lipid, cmp, tSH, Vit D NEEDED, past due, orders given today  Please work on good  health habits so that your health will improve. 1. Commitment to daily physical activity for 30 to 60  minutes, if you are able to do this.  2. Commitment to wise food choices. Aim for half of your  food intake to be vegetable and fruit, one quarter starchy foods, and one quarter protein. Try to eat on a regular schedule  3 meals per day, snacking between meals should be limited to vegetables or fruits or small portions of nuts. 64 ounces of water per day is generally recommended, unless you have specific health conditions, like heart failure or kidney failure where you will need to limit fluid intake.  3. Commitment to sufficient and a  good quality of physical and mental rest daily, generally between 6 to 8 hours per day.  WITH PERSISTANCE AND PERSEVERANCE, THE IMPOSSIBLE , BECOMES THE NORM! Thank you  for choosing [facility]. We consider it a privelige to serve you.  Delivering excellent health care in a caring and  compassionate way is our goal.  Partnering with you,  so that together we can achieve this goal is our strategy.

## 2016-05-10 NOTE — Assessment & Plan Note (Signed)
Controlled, no change in medication DASH diet and commitment to daily physical activity for a minimum of 30 minutes discussed and encouraged, as a part of hypertension management. The importance of attaining a healthy weight is also discussed.  BP/Weight 05/10/2016 03/16/2016 01/26/2016 01/25/2016 01/20/2016 [id number]  Systolic BP 123XX123 123XX123 A999333 - [id number] 123456 [id number]  Diastolic BP 88 82 81 - 82 82 82  Wt. (Lbs) 156 153 - 151 151 151 151  BMI 30.47 29.88 - 29.49 29.49 29.49 29.49  Some encounter information is confidential and restricted. Go to Review Flowsheets activity to see all data.

## 2016-05-10 NOTE — Assessment & Plan Note (Signed)
Reviewed and signed at visit

## 2016-05-10 NOTE — Assessment & Plan Note (Signed)
Improved, trying to quit, last smoked Jan 1, plans to stop entirely at this time Patient counseled for approximately 5 minutes regarding the health risks of ongoing nicotine use, specifically all types of cancer, heart disease, stroke and respiratory failure. The options available for help with cessation ,the behavioral changes to assist the process, and the option to either gradully reduce usage  Or abruptly stop.is also discussed. Pt is also encouraged to set specific goals in number of cigarettes used daily, as well as to set a quit date.

## 2016-05-10 NOTE — Progress Notes (Signed)
Rebecca Boyd     MRN: [id number]      DOB: 1976/02/17   HPI Rebecca Boyd is here for follow up and re-evaluation of chronic medical conditions, medication management and review of any available recent lab and radiology data.  Preventive health is updated, specifically  Cancer screening and Immunization.    The PT denies any adverse reactions to current medications since the last visit.  C/o ongoing left posterior chest pain, radiating from spine , constant.Has benefited in the past from epidural and is requesting this, relieved to hear normal renal US report  Cut out smoking since Jan 1 and intends to keep it this way ROS Denies recent fever or chills. Denies sinus pressure, nasal congestion, ear pain or sore throat. Denies chest congestion, productive cough or wheezing. Denies chest pains, palpitations and leg swelling Denies abdominal pain, nausea, vomiting,diarrhea or constipation.   Denies dysuria, frequency, hesitancy or incontinence. . Denies depression, anxiety or insomnia. Denies skin break down or rash.   PE  BP 122/88   Pulse 75   Resp 16   Ht 5' (1.524 m)   Wt 156 lb (70.8 kg)   LMP 04/19/2016   SpO2 98%   BMI 30.47 kg/m   Patient alert and oriented and in no cardiopulmonary distress.  HEENT: No facial asymmetry, EOMI,   oropharynx pink and moist.  Neck supple no JVD, no mass.  Chest: Clear to auscultation bilaterally.  CVS: S1, S2 no murmurs, no S3.Regular rate.  ABD: Soft non tender.   Ext: No edema  MS: Adequate ROM spine, shoulders, hips and knees.  Skin: Intact, no ulcerations or rash noted.  Psych: Good eye contact, normal affect. Memory intact not anxious or depressed appearing.  CNS: CN 2-12 intact, power,  normal throughout.no focal deficits noted.   Assessment & Plan  Other chest pain Chronic left posterior chest pain, CXR today , referred for epidural injection also which she has bene fitted from in the past  Essential  hypertension, benign Controlled, no change in medication DASH diet and commitment to daily physical activity for a minimum of 30 minutes discussed and encouraged, as a part of hypertension management. The importance of attaining a healthy weight is also discussed.  BP/Weight 05/10/2016 03/16/2016 01/26/2016 01/25/2016 01/20/2016 [id number]  Systolic BP 123XX123 123XX123 [id number] - [id number] [id number] [id number]  Diastolic BP 88 82 81 - 82 82 82  Wt. (Lbs) 156 153 - 151 151 151 151  BMI 30.47 29.88 - 29.49 29.49 29.49 29.49  Some encounter information is confidential and restricted. Go to Review Flowsheets activity to see all data.       Vitamin D deficiency Improved, trying to quit, last smoked Jan 1, plans to stop entirely at this time Patient counseled for approximately 5 minutes regarding the health risks of ongoing nicotine use, specifically all types of cancer, heart disease, stroke and respiratory failure. The options available for help with cessation ,the behavioral changes to assist the process, and the option to either gradully reduce usage  Or abruptly stop.is also discussed. Pt is also encouraged to set specific goals in number of cigarettes used daily, as well as to set a quit date.     Overweight (BMI 25.0-29.9) Deteriorated. Patient re-educated about  the importance of commitment to a  minimum of 150 minutes of exercise per week.  The importance of healthy food choices with portion control discussed. Encouraged to start a food diary, count calories and to consider  joining a  support group. Sample diet sheets offered. Goals set by the patient for the next several months.   Weight /BMI 05/10/2016 03/16/2016 01/25/2016  WEIGHT 156 lb 153 lb 151 lb  HEIGHT 5\' 0"  5\' 0"  5\' 0"   BMI 30.47 kg/m2 29.88 kg/m2 29.49 kg/m2  Some encounter information is confidential and restricted. Go to Review Flowsheets activity to see all data.      Back pain Chronic  Spine pain worse in thoracic area, pain contract  reviewed and new policy implemented where pt receives 3 months of medication, contract reviewd discussed and signed  Asthma, mild intermittent Controlled with very infrequent flares and no need for prophylactic medication  Pain management contract signed Reviewed and signed at visit

## 2016-05-10 NOTE — Assessment & Plan Note (Addendum)
Chronic left posterior chest pain, CXR today , referred for epidural injection also which she has bene fitted from in the past

## 2016-05-11 ENCOUNTER — Telehealth: Payer: Self-pay

## 2016-05-11 ENCOUNTER — Ambulatory Visit (INDEPENDENT_AMBULATORY_CARE_PROVIDER_SITE_OTHER): Payer: Self-pay | Admitting: Otolaryngology

## 2016-05-11 LAB — VITAMIN D 25 HYDROXY (VIT D DEFICIENCY, FRACTURES): VIT D 25 HYDROXY: 10 ng/mL — AB (ref 30–100)

## 2016-05-12 ENCOUNTER — Other Ambulatory Visit: Payer: Self-pay

## 2016-05-12 MED ORDER — VITAMIN D (ERGOCALCIFEROL) 1.25 MG (50000 UNIT) PO CAPS: 50000.0000 [IU] | ORAL_CAPSULE | ORAL | 5 refills | Status: DC

## 2016-05-12 NOTE — Telephone Encounter (Signed)
Pt has had epidural in past for back pain, pls see chart, I am recommending she return to same facility for this as she reports that it helped her pain

## 2016-05-15 NOTE — Telephone Encounter (Signed)
Noted that original order is for epidural of thoracic.  Will await return call from referred to location

## 2016-06-06 ENCOUNTER — Other Ambulatory Visit: Payer: Self-pay

## 2016-06-06 ENCOUNTER — Telehealth: Payer: Self-pay | Admitting: Family Medicine

## 2016-06-06 MED ORDER — ALPRAZOLAM 1 MG PO TABS: ORAL_TABLET | ORAL | 2 refills | Status: DC

## 2016-06-06 NOTE — Telephone Encounter (Signed)
Med printed for pcp signature

## 2016-06-06 NOTE — Telephone Encounter (Signed)
Rebecca Boyd is asking for a refill on ALPRAZolam Duanne Moron) 1 MG tablet sent to [facility], please advise?

## 2016-06-12 ENCOUNTER — Other Ambulatory Visit: Payer: Self-pay

## 2016-06-12 DIAGNOSIS — I10 Essential (primary) hypertension: Secondary | ICD-10-CM

## 2016-06-12 MED ORDER — AMLODIPINE BESYLATE 2.5 MG PO TABS: 2.5000 mg | ORAL_TABLET | Freq: Every day | ORAL | 5 refills | Status: DC

## 2016-06-20 ENCOUNTER — Ambulatory Visit: Payer: Self-pay | Admitting: Family Medicine

## 2016-08-07 ENCOUNTER — Other Ambulatory Visit: Payer: Self-pay | Admitting: Family Medicine

## 2016-08-10 ENCOUNTER — Ambulatory Visit (INDEPENDENT_AMBULATORY_CARE_PROVIDER_SITE_OTHER): Payer: BLUE CROSS/BLUE SHIELD | Admitting: Family Medicine

## 2016-08-10 ENCOUNTER — Encounter: Payer: Self-pay | Admitting: Family Medicine

## 2016-08-10 ENCOUNTER — Other Ambulatory Visit (HOSPITAL_COMMUNITY)
Admission: RE | Admit: 2016-08-10 | Discharge: 2016-08-10 | Disposition: A | Discharge status: Home / Self Care | Payer: BLUE CROSS/BLUE SHIELD | Source: Ambulatory Visit | Attending: Family Medicine | Admitting: Family Medicine

## 2016-08-10 VITALS — BP 122/82 | HR 96 | Resp 16 | Ht 60.0 in | Wt 160.0 lb

## 2016-08-10 DIAGNOSIS — F419 Anxiety disorder, unspecified: Secondary | ICD-10-CM | POA: Diagnosis not present

## 2016-08-10 DIAGNOSIS — G8929 Other chronic pain: Secondary | ICD-10-CM

## 2016-08-10 DIAGNOSIS — I10 Essential (primary) hypertension: Secondary | ICD-10-CM | POA: Diagnosis not present

## 2016-08-10 DIAGNOSIS — N760 Acute vaginitis: Secondary | ICD-10-CM | POA: Insufficient documentation

## 2016-08-10 DIAGNOSIS — E663 Overweight: Secondary | ICD-10-CM | POA: Diagnosis not present

## 2016-08-10 DIAGNOSIS — Z1231 Encounter for screening mammogram for malignant neoplasm of breast: Secondary | ICD-10-CM | POA: Diagnosis not present

## 2016-08-10 DIAGNOSIS — G44029 Chronic cluster headache, not intractable: Secondary | ICD-10-CM | POA: Diagnosis not present

## 2016-08-10 DIAGNOSIS — Z1239 Encounter for other screening for malignant neoplasm of breast: Secondary | ICD-10-CM

## 2016-08-10 MED ORDER — IBUPROFEN 800 MG PO TABS: ORAL_TABLET | ORAL | 0 refills | Status: DC

## 2016-08-10 MED ORDER — FLUTICASONE PROPIONATE 50 MCG/ACT NA SUSP
2.0000 | Freq: Every day | NASAL | 1 refills | Status: DC
Start: 2016-08-10 — End: 2018-06-10

## 2016-08-10 MED ORDER — OXYCODONE-ACETAMINOPHEN 5-325 MG PO TABS: 1.0000 | ORAL_TABLET | Freq: Every day | ORAL | 0 refills | Status: DC

## 2016-08-10 MED ORDER — TOPIRAMATE 50 MG PO TABS: 50.0000 mg | ORAL_TABLET | Freq: Two times a day (BID) | ORAL | 5 refills | Status: DC

## 2016-08-10 MED ORDER — METRONIDAZOLE 500 MG PO TABS: 500.0000 mg | ORAL_TABLET | Freq: Two times a day (BID) | ORAL | 0 refills | Status: DC

## 2016-08-10 MED ORDER — FLUCONAZOLE 150 MG PO TABS: ORAL_TABLET | ORAL | 0 refills | Status: DC

## 2016-08-10 NOTE — Assessment & Plan Note (Signed)
Uncontrolled , reports twice weekly headaches, resume topamax for prohylaxis

## 2016-08-10 NOTE — Assessment & Plan Note (Signed)
Specimens sent for testing, treated presumptively for bV based on history and exam, will also treat skin infection in right inner thigh

## 2016-08-10 NOTE — Assessment & Plan Note (Signed)
Controlled, no change in medication  

## 2016-08-10 NOTE — Progress Notes (Signed)
Rebecca Boyd     MRN: [id number]      DOB: 1975/09/08   HPI Rebecca Boyd is here for follow up and re-evaluation of chronic medical conditions,, pain management  medication management and review of any available recent lab and radiology data.  Preventive health is updated, specifically  Cancer screening and Immunization.    The PT denies any adverse reactions to current medications since the last visit.  One week h/o fishy vaginal d/c , has h/o recurrent BV , states she uses condoms, but wants to be checked for "everything Boil  On right inner thigh with drainage for past 2 to 3 days Remains nicotine free c/o headaches twice weekly with bilateral nose bleeds with headache, no longer taking topamax but needs this States adequate o pain control on current regime , generally a 2 to 3   ROS Denies recent fever or chills. Denies sinus pressure, nasal congestion, ear pain or sore throat. Denies chest congestion, productive cough or wheezing. Denies chest pains, palpitations and leg swelling Denies abdominal pain, nausea, vomiting,diarrhea or constipation.   Denies dysuria, frequency, hesitancy or incontinence.  Denies , seizures, numbness, or tingling. Denies depression, uncontrolled anxiety or insomnia.   PE  BP 122/82   Pulse 96   Resp 16   Ht 5' (1.524 m)   Wt 160 lb (72.6 kg)   SpO2 99%   BMI 31.25 kg/m   Patient alert and oriented and in no cardiopulmonary distress.  HEENT: No facial asymmetry, EOMI,   oropharynx pink and moist.  Neck supple no JVD, no mass.  Chest: Clear to auscultation bilaterally.  CVS: S1, S2 no murmurs, no S3.Regular rate.  ABD: Soft non tender.   Ext: No edema  MS: Adequate ROM spine, shoulders, hips and knees. Pelvic: yellow fishy vaginal discharge, no ulceration of vaginal canal noted, no tenderness on exam. Skin: open boil on right inner thigh, healing , no current drainage  Psych: Good eye contact, normal affect. Memory intact not  anxious or depressed appearing.  CNS: CN 2-12 intact, power,  normal throughout.no focal deficits noted.   Assessment & Plan  Encounter for chronic pain management Reports  Good pain control on current regime. Registry reviewed and she is compliant. 12 weeks of medication prescribed to be filled on schedule No med change  Headache Uncontrolled , reports twice weekly headaches, resume topamax for prohylaxis  Overweight (BMI 25.0-29.9) Deteriorated. Patient re-educated about  the importance of commitment to a  minimum of 150 minutes of exercise per week.  The importance of healthy food choices with portion control discussed. Encouraged to start a food diary, count calories and to consider  joining a support group. Sample diet sheets offered. Goals set by the patient for the next several months.   Weight /BMI 08/10/2016 05/10/2016 03/16/2016  WEIGHT 160 lb 156 lb 153 lb  HEIGHT 5\' 0"  5\' 0"  5\' 0"   BMI 31.25 kg/m2 30.47 kg/m2 29.88 kg/m2  Some encounter information is confidential and restricted. Go to Review Flowsheets activity to see all data.      Anxiety Controlled, no change in medication   Essential hypertension, benign Controlled, no change in medication DASH diet and commitment to daily physical activity for a minimum of 30 minutes discussed and encouraged, as a part of hypertension management. The importance of attaining a healthy weight is also discussed.  BP/Weight 08/10/2016 05/10/2016 03/16/2016 01/26/2016 01/25/2016 01/20/2016 1/63/8466  Systolic BP 599 357 017 793 - 903 009  Diastolic BP  82 88 82 81 - 82 82  Wt. (Lbs) 160 156 153 - 151 151 151  BMI 31.25 30.47 29.88 - 29.49 29.49 29.49  Some encounter information is confidential and restricted. Go to Review Flowsheets activity to see all data.       Vulvovaginitis Specimens sent for testing, treated presumptively for bV based on history and exam, will also treat skin infection in right inner thigh

## 2016-08-10 NOTE — Patient Instructions (Addendum)
f/u week of July 16, call if you need me sooner  PLEASE schedule your mammogram, call [phone]  Start once daily vit D3 2000 IU this is oTC   You need to reduce fried and fatty foods, red meat ,butter and cheese, bad cholesterol is too high  Nose spray and ibuprofen are refilled  New is topamax twice daily to reduce headache frequency  Call for appt for ENT if nose bleeds continue  Antibiotic sent for boil and discharge and also tablet for yeast infection  Please work on good  health habits so that your health will improve. 1. Commitment to daily physical activity for 30 to 60  minutes, if you are able to do this.  2. Commitment to wise food choices. Aim for half of your  food intake to be vegetable and fruit, one quarter starchy foods, and one quarter protein. Try to eat on a regular schedule  3 meals per day, snacking between meals should be limited to vegetables or fruits or small portions of nuts. 64 ounces of water per day is generally recommended, unless you have specific health conditions, like heart failure or kidney failure where you will need to limit fluid intake.  3. Commitment to sufficient and a  good quality of physical and mental rest daily, generally between 6 to 8 hours per day.  WITH PERSISTANCE AND PERSEVERANCE, THE IMPOSSIBLE , BECOMES THE NORM! Thank you  for choosing [facility]. We consider it a privelige to serve you.  Delivering excellent health care in a caring and  compassionate way is our goal.  Partnering with you,  so that together we can achieve this goal is our strategy.

## 2016-08-10 NOTE — Assessment & Plan Note (Signed)
Reports  Good pain control on current regime. Registry reviewed and she is compliant. 12 weeks of medication prescribed to be filled on schedule No med change

## 2016-08-10 NOTE — Assessment & Plan Note (Signed)
Deteriorated. Patient re-educated about  the importance of commitment to a  minimum of 150 minutes of exercise per week.  The importance of healthy food choices with portion control discussed. Encouraged to start a food diary, count calories and to consider  joining a support group. Sample diet sheets offered. Goals set by the patient for the next several months.   Weight /BMI 08/10/2016 05/10/2016 03/16/2016  WEIGHT 160 lb 156 lb 153 lb  HEIGHT 5\' 0"  5\' 0"  5\' 0"   BMI 31.25 kg/m2 30.47 kg/m2 29.88 kg/m2  Some encounter information is confidential and restricted. Go to Review Flowsheets activity to see all data.

## 2016-08-10 NOTE — Assessment & Plan Note (Signed)
Controlled, no change in medication DASH diet and commitment to daily physical activity for a minimum of 30 minutes discussed and encouraged, as a part of hypertension management. The importance of attaining a healthy weight is also discussed.  BP/Weight 08/10/2016 05/10/2016 03/16/2016 01/26/2016 01/25/2016 01/20/2016 8/59/2924  Systolic BP 462 863 817 711 - 657 903  Diastolic BP 82 88 82 81 - 82 82  Wt. (Lbs) 160 156 153 - 151 151 151  BMI 31.25 30.47 29.88 - 29.49 29.49 29.49  Some encounter information is confidential and restricted. Go to Review Flowsheets activity to see all data.

## 2016-08-11 LAB — CERVICOVAGINAL ANCILLARY ONLY
CHLAMYDIA, DNA PROBE: NEGATIVE
Neisseria Gonorrhea: NEGATIVE
WET PREP (BD AFFIRM): POSITIVE — AB

## 2016-08-13 ENCOUNTER — Emergency Department (HOSPITAL_COMMUNITY)
Admission: EM | Admit: 2016-08-13 | Discharge: 2016-08-13 | Disposition: A | Discharge status: Home / Self Care | Payer: BLUE CROSS/BLUE SHIELD | Attending: Emergency Medicine | Admitting: Emergency Medicine

## 2016-08-13 ENCOUNTER — Emergency Department (HOSPITAL_COMMUNITY): Payer: BLUE CROSS/BLUE SHIELD

## 2016-08-13 ENCOUNTER — Encounter (HOSPITAL_COMMUNITY): Payer: Self-pay | Admitting: Emergency Medicine

## 2016-08-13 DIAGNOSIS — J45909 Unspecified asthma, uncomplicated: Secondary | ICD-10-CM | POA: Insufficient documentation

## 2016-08-13 DIAGNOSIS — R0602 Shortness of breath: Secondary | ICD-10-CM | POA: Insufficient documentation

## 2016-08-13 DIAGNOSIS — Z79899 Other long term (current) drug therapy: Secondary | ICD-10-CM | POA: Diagnosis not present

## 2016-08-13 DIAGNOSIS — I10 Essential (primary) hypertension: Secondary | ICD-10-CM | POA: Diagnosis not present

## 2016-08-13 DIAGNOSIS — R079 Chest pain, unspecified: Secondary | ICD-10-CM

## 2016-08-13 DIAGNOSIS — R0789 Other chest pain: Secondary | ICD-10-CM | POA: Insufficient documentation

## 2016-08-13 DIAGNOSIS — Z87891 Personal history of nicotine dependence: Secondary | ICD-10-CM | POA: Diagnosis not present

## 2016-08-13 LAB — CBC
HEMATOCRIT: 36.7 % (ref 36.0–46.0)
Hemoglobin: 13 g/dL (ref 12.0–15.0)
MCH: 30.7 pg (ref 26.0–34.0)
MCHC: 35.4 g/dL (ref 30.0–36.0)
MCV: 86.8 fL (ref 78.0–100.0)
Platelets: 149 10*3/uL — ABNORMAL LOW (ref 150–400)
RBC: 4.23 MIL/uL (ref 3.87–5.11)
RDW: 13.2 % (ref 11.5–15.5)
WBC: 5.9 10*3/uL (ref 4.0–10.5)

## 2016-08-13 LAB — BASIC METABOLIC PANEL
Anion gap: 7 (ref 5–15)
BUN: 14 mg/dL (ref 6–20)
CHLORIDE: 109 mmol/L (ref 101–111)
CO2: 24 mmol/L (ref 22–32)
CREATININE: 0.8 mg/dL (ref 0.44–1.00)
Calcium: 9.3 mg/dL (ref 8.9–10.3)
GFR calc Af Amer: >60 mL/min (ref >60)
GFR calc non Af Amer: >60 mL/min (ref >60)
GLUCOSE: 95 mg/dL (ref 65–99)
POTASSIUM: 3.8 mmol/L (ref 3.5–5.1)
Sodium: 140 mmol/L (ref 135–145)

## 2016-08-13 LAB — TROPONIN I
Troponin I: <0.03 ng/mL (ref <0.03)
Troponin I: <0.03 ng/mL (ref <0.03)

## 2016-08-13 MED ORDER — NAPROXEN 500 MG PO TABS: 500.0000 mg | ORAL_TABLET | Freq: Two times a day (BID) | ORAL | 0 refills | Status: DC

## 2016-08-13 NOTE — ED Triage Notes (Signed)
Patient c/o left side chest pain, under left breast. Per patient shortness of breath, nausea, vomiting, and dizziness. No active vomiting noted at this time.  Denies any cardiac hx. Patient has hx bronchitis in which she uses inhaler. Per patient used inhaler approx 20 minutes ago with no relief.

## 2016-08-13 NOTE — Discharge Instructions (Signed)
Take the medications as prescribed, follow-up with your doctor next week to make sure your symptoms are resolving, return as needed for worsening symptoms

## 2016-08-13 NOTE — ED Provider Notes (Signed)
[place] DEPT Provider Note   CSN: [id number] Arrival date & time: 08/13/16  1619     History   Chief Complaint Chief Complaint  Patient presents with  . Chest Pain    HPI Rebecca Boyd is a 41 y.o. female.  HPI Patient presents to the emergency room for evaluation of sharp left-sided chest pain. Patient states she was at work when the pain suddenly started. If the cramping sharp pain. It does not radiate. She does feel short of breath with it. She denies any nausea. No vomiting. Patient denies any history of heart disease. She does not have any history of pulmonary embolism or DVT. She does have hypertension, hypercholesterolemia and she smokes cigarettes.  Patient denies any prior history of similar episodes. Past Medical History:  Diagnosis Date  . Anemia   . Anxiety   . Depressive disorder, not elsewhere classified   . Hypertension   . Migraine   . Miscarriage 2009  . Neck pain   . Vaginitis    cyctitis    Patient Active Problem List   Diagnosis Date Noted  . Vulvovaginitis 08/10/2016  . Encounter for chronic pain management 05/10/2016  . Headache 03/16/2016  . Mood disorder in conditions classified elsewhere 03/17/2015  . Overweight (BMI 25.0-29.9) 03/17/2015  . Back pain 08/30/2014  . Essential hypertension, benign 01/07/2014  . Cervical neck pain with evidence of disc disease 10/02/2013  . 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 Asthma, mild intermittent 11/14/2012  . Vitamin D deficiency 10/27/2010  . Anxiety 10/26/2010  . Iron deficiency anemia due to chronic blood loss 03/07/2010    Past Surgical History:  Procedure Laterality Date  . cervical cryotherapy N/A 1999  . DILITATION & CURRETTAGE/HYSTROSCOPY WITH THERMACHOICE ABLATION  08/26/2013   Procedure: DILATATION & CURETTAGE/HYSTEROSCOPY WITH THERMACHOICE ENDOMETRIAL ABLATION Procedure #2 Total Therapy Time=min       sec;  Surgeon: Jonnie Kind, MD;  Location: [facility];  Service: Gynecology;;  .  LAPAROSCOPIC BILATERAL SALPINGECTOMY Bilateral 08/26/2013   Procedure: LAPAROSCOPIC BILATERAL SALPINGECTOMY AND REMOVAL OF LEFT PERITUBAL CYST Procedure #1;  Surgeon: Jonnie Kind, MD;  Location: [facility];  Service: Gynecology;  Laterality: Bilateral;  . LAPAROSCOPIC LYSIS OF ADHESIONS  08/26/2013   Procedure: LAPAROSCOPIC LYSIS OF ADHESIONS Procedure #1;  Surgeon: Jonnie Kind, MD;  Location: [facility];  Service: Gynecology;;  . Carmelia Bake LESION REMOVAL  08/26/2013   Procedure: REMOVAL OF VULVAR SEBACEOUS CYST Procedure #3;  Surgeon: Jonnie Kind, MD;  Location: [facility];  Service: Gynecology;;    OB History    No data available       Home Medications    Prior to Admission medications   Medication Sig Start Date End Date Taking? Authorizing Provider  albuterol (PROVENTIL HFA;VENTOLIN HFA) 108 (90 Base) MCG/ACT inhaler Inhale 2 puffs into the lungs every 6 (six) hours as needed for wheezing or shortness of breath. 02/17/16  Yes Fayrene Helper, MD  ALPRAZolam Duanne Moron) 1 MG tablet TAKE 1 TABLET BY MOUTH AT BEDTIME AS NEEDED FOR ANXIETY. 08/07/16  Yes Fayrene Helper, MD  amLODipine (NORVASC) 2.5 MG tablet Take 1 tablet (2.5 mg total) by mouth daily. 06/12/16  Yes Fayrene Helper, MD  fluconazole (DIFLUCAN) 150 MG tablet One tablet once on completion of antibiotic 08/10/16  Yes Fayrene Helper, MD  fluticasone [facility]) 50 MCG/ACT nasal spray Place 2 sprays into both nostrils daily. Patient taking differently: Place 2 sprays into both nostrils daily as needed for allergies.  08/10/16  Yes Fayrene Helper, MD  ibuprofen (ADVIL,MOTRIN) 800 MG tablet One tablet twice daily, as needed, for uncontrolled headaxche, maximum of 4 tablets per week 08/10/16  Yes Fayrene Helper, MD  metroNIDAZOLE (FLAGYL) 500 MG tablet Take 1 tablet (500 mg total) by mouth 2 (two) times daily. 08/10/16  Yes Fayrene Helper, MD  oxyCODONE-acetaminophen (PERCOCET/ROXICET) 5-325 MG tablet Take 1 tablet by mouth  at bedtime. 07/26/16  Yes Fayrene Helper, MD  topiramate (TOPAMAX) 50 MG tablet Take 1 tablet (50 mg total) by mouth 2 (two) times daily. 08/10/16  Yes Fayrene Helper, MD  naproxen (NAPROSYN) 500 MG tablet Take 1 tablet (500 mg total) by mouth 2 (two) times daily with a meal. As needed for pain 08/13/16   Dorie Rank, MD  oxyCODONE-acetaminophen (PERCOCET/ROXICET) 5-325 MG tablet Take 1 tablet by mouth at bedtime. 08/10/16   Fayrene Helper, MD  oxyCODONE-acetaminophen (PERCOCET/ROXICET) 5-325 MG tablet Take 1 tablet by mouth at bedtime. 08/10/16   Fayrene Helper, MD  oxyCODONE-acetaminophen (PERCOCET/ROXICET) 5-325 MG tablet Take 1 tablet by mouth at bedtime. 08/10/16   Fayrene Helper, MD    Family History Family History  Problem Relation Age of Onset  . Drug abuse Mother   . Hypertension Father   . Hypertension Maternal Grandmother   . Hypertension Maternal Grandfather   . Hypertension Paternal Grandmother     Social History Social History  Substance Use Topics  . Smoking status: Former Smoker    Packs/day: 0.25    Types: Cigars    Quit date: 01/06/2016  . Smokeless tobacco: Never Used  . Alcohol use Yes     Comment: occasional     Allergies   Diphenhydramine hcl; Orange fruit; and Latex   Review of Systems Review of Systems  All other systems reviewed and are negative.    Physical Exam Updated Vital Signs BP 139/89   Pulse 72   Temp 97.9 F (36.6 C) (Oral)   Resp (!) 23   Ht 5' (1.524 m)   Wt 72.6 kg   LMP 08/06/2016   SpO2 100%   BMI 31.25 kg/m   Physical Exam  Constitutional: She appears well-developed and well-nourished. No distress.  HENT:  Head: Normocephalic and atraumatic.  Right Ear: External ear normal.  Left Ear: External ear normal.  Eyes: Conjunctivae are normal. Right eye exhibits no discharge. Left eye exhibits no discharge. No scleral icterus.  Neck: Neck supple. No tracheal deviation present.  Cardiovascular: Normal rate,  regular rhythm and intact distal pulses.   Pulmonary/Chest: Effort normal and breath sounds normal. No stridor. No respiratory distress. She has no wheezes. She has no rales.  Abdominal: Soft. Bowel sounds are normal. She exhibits no distension. There is no tenderness. There is no rebound and no guarding.  Musculoskeletal: She exhibits no edema or tenderness.  Neurological: She is alert. She has normal strength. No cranial nerve deficit (no facial droop, extraocular movements intact, no slurred speech) or sensory deficit. She exhibits normal muscle tone. She displays no seizure activity. Coordination normal.  Skin: Skin is warm and dry. No rash noted.  Psychiatric: She has a normal mood and affect.  Nursing note and vitals reviewed.    ED Treatments / Results  Labs (all labs ordered are listed, but only abnormal results are displayed) Labs Reviewed  CBC - Abnormal; Notable for the following:       Result Value   Platelets 149 (*)    All other  components within normal limits  BASIC METABOLIC PANEL  TROPONIN I  TROPONIN I    EKG  EKG Interpretation  Date/Time:  Sunday August 13 2016 16:35:14 EDT Ventricular Rate:  99 PR Interval:    QRS Duration: 77 QT Interval:  354 QTC Calculation: 455 R Axis:   54 Text Interpretation:  Sinus rhythm No significant change since last tracing Confirmed by Ibrahem Volkman  MD-J, Tsugio Elison ([id number]) on 08/13/2016 4:45:27 PM       Radiology Dg Chest 2 View  Result Date: 08/13/2016 CLINICAL DATA:  Chest pain and shortness of Breath EXAM: CHEST  2 VIEW COMPARISON:  05/10/2016 FINDINGS: The heart size and mediastinal contours are within normal limits. Both lungs are clear. The visualized skeletal structures are unremarkable. IMPRESSION: No active cardiopulmonary disease. Electronically Signed   By: Inez Catalina M.D.   On: 08/13/2016 17:37    Procedures Procedures (including critical care time)  Medications Ordered in ED Medications - No data to  display   Initial Impression / Assessment and Plan / ED Course  I have reviewed the triage vital signs and the nursing notes.  Pertinent labs & imaging results that were available during my care of the patient were reviewed by me and considered in my medical decision making (see chart for details).  Clinical Course as of Aug 13 2045  Nancy Fetter Aug 13, 2016  1715 Low risk for PE.  PERC  negative  [JK]  2046 Pt is feeling well.  Discussed findings.  Ready for discharge  [JK]    Clinical Course User Index [JK] Dorie Rank, MD    No PE risk factors.  Low suspicion.  PERC negative.  Chest pain atypical for ACS.  Low risk. Heart score 2,  Serial troponins negative.     At this time there does not appear to be any evidence of an acute emergency medical condition and the patient appears stable for discharge with appropriate outpatient follow up.  Final Clinical Impressions(s) / ED Diagnoses   Final diagnoses:  Chest pain with low risk for cardiac etiology    New Prescriptions New Prescriptions   NAPROXEN (NAPROSYN) 500 MG TABLET    Take 1 tablet (500 mg total) by mouth 2 (two) times daily with a meal. As needed for pain     Dorie Rank, MD 08/13/16 2047

## 2016-08-13 NOTE — ED Notes (Signed)
Pt alert & oriented x4, stable gait. Patient given discharge instructions, paperwork & prescription(s). Patient  instructed to stop at the registration desk to finish any additional paperwork. Patient verbalized understanding. Pt left department w/ no further questions. 

## 2016-08-16 ENCOUNTER — Ambulatory Visit (HOSPITAL_COMMUNITY)
Admission: RE | Admit: 2016-08-16 | Discharge: 2016-08-16 | Disposition: A | Discharge status: Home / Self Care | Payer: BLUE CROSS/BLUE SHIELD | Source: Ambulatory Visit | Attending: Family Medicine | Admitting: Family Medicine

## 2016-08-16 DIAGNOSIS — Z1231 Encounter for screening mammogram for malignant neoplasm of breast: Secondary | ICD-10-CM | POA: Diagnosis not present

## 2016-08-16 DIAGNOSIS — Z1239 Encounter for other screening for malignant neoplasm of breast: Secondary | ICD-10-CM

## 2016-09-05 ENCOUNTER — Telehealth: Payer: Self-pay | Admitting: Family Medicine

## 2016-09-05 NOTE — Telephone Encounter (Signed)
Requesting a xanex refill   cb#:  [phone]  Pharm: [facility]

## 2016-09-05 NOTE — Telephone Encounter (Signed)
Pharmacy confirmed they had refills and would get it ready

## 2016-10-03 ENCOUNTER — Encounter: Payer: Self-pay | Admitting: Family Medicine

## 2016-10-03 ENCOUNTER — Ambulatory Visit (INDEPENDENT_AMBULATORY_CARE_PROVIDER_SITE_OTHER): Payer: BLUE CROSS/BLUE SHIELD | Admitting: Family Medicine

## 2016-10-03 VITALS — BP 120/82 | HR 85 | Resp 16 | Ht 60.0 in | Wt 160.0 lb

## 2016-10-03 DIAGNOSIS — G44009 Cluster headache syndrome, unspecified, not intractable: Secondary | ICD-10-CM

## 2016-10-03 DIAGNOSIS — M549 Dorsalgia, unspecified: Secondary | ICD-10-CM

## 2016-10-03 DIAGNOSIS — M545 Low back pain: Secondary | ICD-10-CM

## 2016-10-03 DIAGNOSIS — N912 Amenorrhea, unspecified: Secondary | ICD-10-CM

## 2016-10-03 DIAGNOSIS — I10 Essential (primary) hypertension: Secondary | ICD-10-CM

## 2016-10-03 DIAGNOSIS — E663 Overweight: Secondary | ICD-10-CM | POA: Diagnosis not present

## 2016-10-03 DIAGNOSIS — J4522 Mild intermittent asthma with status asthmaticus: Secondary | ICD-10-CM | POA: Diagnosis not present

## 2016-10-03 LAB — POCT URINALYSIS DIPSTICK
BILIRUBIN UA: NEGATIVE
GLUCOSE UA: NEGATIVE
KETONES UA: NEGATIVE
Leukocytes, UA: NEGATIVE
NITRITE UA: NEGATIVE
Protein, UA: NEGATIVE
RBC UA: NEGATIVE
SPEC GRAV UA: 1.015 (ref 1.010–1.025)
Urobilinogen, UA: 0.2 E.U./dL
pH, UA: 7 (ref 5.0–8.0)

## 2016-10-03 LAB — POCT URINE PREGNANCY: Preg Test, Ur: NEGATIVE

## 2016-10-03 MED ORDER — METHYLPREDNISOLONE ACETATE 80 MG/ML IJ SUSP
80.0000 mg | Freq: Once | INTRAMUSCULAR | Status: AC
  Administered 2016-10-03: 80 mg via INTRAMUSCULAR

## 2016-10-03 MED ORDER — KETOROLAC TROMETHAMINE 60 MG/2ML IM SOLN
60.0000 mg | Freq: Once | INTRAMUSCULAR | Status: AC
  Administered 2016-10-03: 60 mg via INTRAMUSCULAR

## 2016-10-03 MED ORDER — TOPIRAMATE 100 MG PO TABS: 100.0000 mg | ORAL_TABLET | Freq: Two times a day (BID) | ORAL | 3 refills | Status: DC

## 2016-10-03 MED ORDER — PREDNISONE 5 MG (21) PO TBPK: 5.0000 mg | ORAL_TABLET | ORAL | 0 refills | Status: DC

## 2016-10-03 NOTE — Patient Instructions (Addendum)
F/u in July, as before, call if you need me sooner  You are to start a higher dose of topamax in the hope that you will have less frequent and less severe headaches, OK to take tWO 50 mg topamax tablets twice daily till done, new dose is 100 mg one twice daily. You will be referred to neurology if no improvement when I see you at next visit  You are not pregnant and urine shows no sign of infection  The pain you are having I believe is coming from arthritic problems in your low back, two injections, toradol and depoemedrol are  Administered in the office and 6 day course of prednisone sent oin. Call in 3 weeks if no better please  Thank you  for choosing [facility]. We consider it a privelige to serve you.  Delivering excellent health care in a caring and  compassionate way is our goal.  Partnering with you,  so that together we can achieve this goal is our strategy.

## 2016-10-03 NOTE — Progress Notes (Signed)
Rebecca Boyd     MRN: [id number]      DOB: [birth year]   HPI Rebecca Boyd is here for follow up and re-evaluation of chronic medical conditions, medication management and review of any available recent lab and radiology data.  Preventive health is updated, specifically  Cancer screening and Immunization.   Questions or concerns regarding consultations or procedures which the PT has had in the interim are  addressed. The PT denies any adverse reactions to current medications since the last visit.  3 week h/o daily sharp sticking lower pelvic and groin pain worse at night sometimes radiating to upper thighs, not lasting long, no lower ext weakness or numbness or incontinence noted. C/o suprapubic pressure, no urinary symptoms , no vaginal discharge, no dyspareunia, but no cycle since April, she is irregular following ablation will test for pregnancy Requests FMLA for headache as she needs to leave work intermittently as a result of these, states she was  Last in the ED on 4/15 with c/o unable to catch her breath, note states chest pain, but pt also reports headache s daily despite prophylactic topamax, will double dose, re eval in 6 weeks if unchanged needs neurology management , she understand and  agrees. Ibuprufoen does relieve  Her headaches  ROS Denies recent fever or chills. Denies sinus pressure, nasal congestion, ear pain or sore throat. Denies chest congestion, productive cough or wheezing. Denies chest pains, palpitations and leg swelling Denies abdominal pain, nausea, vomiting,diarrhea or constipation.   Denies dysuria, frequency, hesitancy or incontinence. . Denies  seizures, numbness, or tingling. Denies depression, anxiety or insomnia. Denies skin break down or rash.   PE  BP 120/82   Pulse 85   Resp 16   Ht 5' (1.524 m)   Wt 160 lb (72.6 kg)   SpO2 100%   BMI 31.25 kg/m   Patient alert and oriented and in no cardiopulmonary distress.  HEENT: No facial asymmetry,  EOMI,   oropharynx pink and moist.  Neck supple no JVD, no mass.  Chest: Clear to auscultation bilaterally.  CVS: S1, S2 no murmurs, no S3.Regular rate.  ABD: Soft mild LLQ tenderness, no guarding or rebound, no palpable organomegaly or mass, normal BS  Ext: No edema  MS: decreased  ROM lumbar  spine, normal in shoulders, hips and knees.  Skin: Intact, no ulcerations or rash noted.  Psych: Good eye contact, normal affect. Memory intact not anxious or depressed appearing.  CNS: CN 2-12 intact, power,  normal throughout.no focal deficits noted.   Assessment & Plan  Back pain with radiation Uncontrolled.Toradol and depo medrol administered IM in the office , to be followed by a short course of oral prednisone and NSAIDS.   Amenorrhea Cycle for over 6 weeks, has hd ablation and is irregular, will test for pregnancy per pt request, this is negative  Headache Uncontrolled and frequent headaches , states she has a daily headache and was in the ED on 08/13/2016, with main c/o headache though visit states c/o chest pain. Needs FMLA documentation of migraines. Will increase topamax dose and she is to start a diary, if frequency and severity persist on this dose, then will refer to neurology for management States anti inflammatory stops the headache  Essential hypertension, benign Controlled, no change in medication DASH diet and commitment to daily physical activity for a minimum of 30 minutes discussed and encouraged, as a part of hypertension management. The importance of attaining a healthy weight is also discussed.  BP/Weight 10/03/2016 08/13/2016 08/10/2016 05/10/2016 03/16/2016 01/26/2016 9/67/5916  Systolic BP 384 665 993 570 177 939 -  Diastolic BP 82 85 82 88 82 81 -  Wt. (Lbs) 160 160 160 156 153 - 151  BMI 31.25 31.25 31.25 30.47 29.88 - 29.49  Some encounter information is confidential and restricted. Go to Review Flowsheets activity to see all data.       Asthma, mild  intermittent Controlled, no change in medication   Overweight (BMI 25.0-29.9) Unchanged Patient re-educated about  the importance of commitment to a  minimum of 150 minutes of exercise per week.  The importance of healthy food choices with portion control discussed. Encouraged to start a food diary, count calories and to consider  joining a support group. Sample diet sheets offered. Goals set by the patient for the next several months.   Weight /BMI 10/03/2016 08/13/2016 08/10/2016  WEIGHT 160 lb 160 lb 160 lb  HEIGHT 5\' 0"  5\' 0"  5\' 0"   BMI 31.25 kg/m2 31.25 kg/m2 31.25 kg/m2  Some encounter information is confidential and restricted. Go to Review Flowsheets activity to see all data.

## 2016-10-03 NOTE — Assessment & Plan Note (Signed)
Uncontrolled.Toradol and depo medrol administered IM in the office , to be followed by a short course of oral prednisone and NSAIDS.  

## 2016-10-06 NOTE — Assessment & Plan Note (Addendum)
Cycle for over 6 weeks, has hd ablation and is irregular, will test for pregnancy per pt request, this is negative

## 2016-10-06 NOTE — Assessment & Plan Note (Signed)
Controlled, no change in medication  

## 2016-10-06 NOTE — Assessment & Plan Note (Signed)
Uncontrolled and frequent headaches , states she has a daily headache and was in the ED on 08/13/2016, with main c/o headache though visit states c/o chest pain. Needs FMLA documentation of migraines. Will increase topamax dose and she is to start a diary, if frequency and severity persist on this dose, then will refer to neurology for management States anti inflammatory stops the headache

## 2016-10-06 NOTE — Assessment & Plan Note (Signed)
Controlled, no change in medication DASH diet and commitment to daily physical activity for a minimum of 30 minutes discussed and encouraged, as a part of hypertension management. The importance of attaining a healthy weight is also discussed.  BP/Weight 10/03/2016 08/13/2016 08/10/2016 05/10/2016 03/16/2016 01/26/2016 5/73/2256  Systolic BP 720 919 802 217 981 025 -  Diastolic BP 82 85 82 88 82 81 -  Wt. (Lbs) 160 160 160 156 153 - 151  BMI 31.25 31.25 31.25 30.47 29.88 - 29.49  Some encounter information is confidential and restricted. Go to Review Flowsheets activity to see all data.

## 2016-10-06 NOTE — Assessment & Plan Note (Signed)
Unchanged Patient re-educated about  the importance of commitment to a  minimum of 150 minutes of exercise per week.  The importance of healthy food choices with portion control discussed. Encouraged to start a food diary, count calories and to consider  joining a support group. Sample diet sheets offered. Goals set by the patient for the next several months.   Weight /BMI 10/03/2016 08/13/2016 08/10/2016  WEIGHT 160 lb 160 lb 160 lb  HEIGHT 5\' 0"  5\' 0"  5\' 0"   BMI 31.25 kg/m2 31.25 kg/m2 31.25 kg/m2  Some encounter information is confidential and restricted. Go to Review Flowsheets activity to see all data.

## 2016-11-09 ENCOUNTER — Telehealth: Payer: Self-pay | Admitting: Family Medicine

## 2016-11-09 NOTE — Telephone Encounter (Signed)
Patient left message on nurse line, states she was returning call. No record of call.

## 2016-11-10 ENCOUNTER — Telehealth: Payer: Self-pay | Admitting: Family Medicine

## 2016-11-10 NOTE — Telephone Encounter (Signed)
New Message  Pt voiced returning nurses call.  Pt would not go into detail just wanted to speak to nurse as to why she was calling.  Please f/u

## 2016-11-13 NOTE — Telephone Encounter (Signed)
Returned patient's call. She states she was originally calling about refills but since she has an appt in the am, she will discuss then

## 2016-11-14 ENCOUNTER — Other Ambulatory Visit: Payer: Self-pay

## 2016-11-14 ENCOUNTER — Ambulatory Visit (INDEPENDENT_AMBULATORY_CARE_PROVIDER_SITE_OTHER): Payer: BLUE CROSS/BLUE SHIELD | Admitting: Family Medicine

## 2016-11-14 ENCOUNTER — Encounter: Payer: Self-pay | Admitting: Family Medicine

## 2016-11-14 VITALS — BP 124/84 | HR 82 | Resp 15 | Ht 60.0 in | Wt 158.0 lb

## 2016-11-14 DIAGNOSIS — E663 Overweight: Secondary | ICD-10-CM | POA: Diagnosis not present

## 2016-11-14 DIAGNOSIS — J4520 Mild intermittent asthma, uncomplicated: Secondary | ICD-10-CM

## 2016-11-14 DIAGNOSIS — G8929 Other chronic pain: Secondary | ICD-10-CM

## 2016-11-14 DIAGNOSIS — N760 Acute vaginitis: Secondary | ICD-10-CM | POA: Diagnosis not present

## 2016-11-14 DIAGNOSIS — I10 Essential (primary) hypertension: Secondary | ICD-10-CM

## 2016-11-14 DIAGNOSIS — E559 Vitamin D deficiency, unspecified: Secondary | ICD-10-CM

## 2016-11-14 MED ORDER — OXYCODONE-ACETAMINOPHEN 5-325 MG PO TABS: 1.0000 | ORAL_TABLET | Freq: Every day | ORAL | 0 refills | Status: DC

## 2016-11-14 MED ORDER — FLUCONAZOLE 150 MG PO TABS: ORAL_TABLET | ORAL | 0 refills | Status: DC

## 2016-11-14 MED ORDER — AMLODIPINE BESYLATE 2.5 MG PO TABS: 2.5000 mg | ORAL_TABLET | Freq: Every day | ORAL | 5 refills | Status: DC

## 2016-11-14 MED ORDER — ALPRAZOLAM 1 MG PO TABS: ORAL_TABLET | ORAL | 2 refills | Status: DC

## 2016-11-14 MED ORDER — ALBUTEROL SULFATE HFA 108 (90 BASE) MCG/ACT IN AERS: 2.0000 | INHALATION_SPRAY | Freq: Four times a day (QID) | RESPIRATORY_TRACT | 1 refills | Status: DC | PRN

## 2016-11-14 MED ORDER — METRONIDAZOLE 500 MG PO TABS: 500.0000 mg | ORAL_TABLET | Freq: Two times a day (BID) | ORAL | 0 refills | Status: DC

## 2016-11-14 NOTE — Assessment & Plan Note (Signed)
Controlled, no change in medication DASH diet and commitment to daily physical activity for a minimum of 30 minutes discussed and encouraged, as a part of hypertension management. The importance of attaining a healthy weight is also discussed.  BP/Weight 11/14/2016 10/03/2016 08/13/2016 08/10/2016 05/10/2016 03/16/2016 2/75/1700  Systolic BP 174 944 967 591 638 466 599  Diastolic BP 84 82 85 82 88 82 81  Wt. (Lbs) 158 160 160 160 156 153 -  BMI 30.86 31.25 31.25 31.25 30.47 29.88 -  Some encounter information is confidential and restricted. Go to Review Flowsheets activity to see all data.

## 2016-11-14 NOTE — Assessment & Plan Note (Addendum)
Pain control.adequate [place] registry reviewed and pt is compliant 12 weeks of med prescribed

## 2016-11-14 NOTE — Assessment & Plan Note (Signed)
Complies with replacement

## 2016-11-14 NOTE — Assessment & Plan Note (Signed)
increased with excess heat , albuterol refilled

## 2016-11-14 NOTE — Assessment & Plan Note (Signed)
improved Patient re-educated about  the importance of commitment to a  minimum of 150 minutes of exercise per week.  The importance of healthy food choices with portion control discussed. Encouraged to start a food diary, count calories and to consider  joining a support group. Sample diet sheets offered. Goals set by the patient for the next several months.   Weight /BMI 11/14/2016 10/03/2016 08/13/2016  WEIGHT 158 lb 160 lb 160 lb  HEIGHT 5\' 0"  5\' 0"  5\' 0"   BMI 30.86 kg/m2 31.25 kg/m2 31.25 kg/m2  Some encounter information is confidential and restricted. Go to Review Flowsheets activity to see all data.

## 2016-11-14 NOTE — Patient Instructions (Signed)
Physical exam in 12 weeks, call if you need me sooner  Please work on good  health habits so that your health will improve. 1. Commitment to daily physical activity for 30 to 60  minutes, if you are able to do this.  2. Commitment to wise food choices. Aim for half of your  food intake to be vegetable and fruit, one quarter starchy foods, and one quarter protein. Try to eat on a regular schedule  3 meals per day, snacking between meals should be limited to vegetables or fruits or small portions of nuts. 64 ounces of water per day is generally recommended, unless you have specific health conditions, like heart failure or kidney failure where you will need to limit fluid intake.  3. Commitment to sufficient and a  good quality of physical and mental rest daily, generally between 6 to 8 hours per day.  WITH PERSISTANCE AND PERSEVERANCE, THE IMPOSSIBLE , BECOMES THE NORM! Monitor cramps in toes with footwear  Medication for d/c sent in  Continue pain medication as before  .It is important that you exercise regularly at least 30 minutes 5 times a week. If you develop chest pain, have severe difficulty breathing, or feel very tired, stop exercising immediately and seek medical attention    Thank you  for choosing  [facility]. We consider it a privelige to serve you.  Delivering excellent health care in a caring and  compassionate way is our goal.  Partnering with you,  so that together we can achieve this goal is our strategy.

## 2016-11-14 NOTE — Progress Notes (Signed)
Rebecca Boyd     MRN: [id number]      DOB: 03/06/1976   HPI Rebecca Boyd is here for follow up and re-evaluation of chronic medical conditions, medication management and review of any available recent lab and radiology data.  Specifically for chronic pain management is reviewed and is satisfactory Preventive health is updated, specifically  Cancer screening and Immunization.   Questions or concerns regarding consultations or procedures which the PT has had in the interim are  addressed. The PT denies any adverse reactions to current medications since the last visit.  1 month h/o localized burning of great toes , lateral aspect, will swee if related to certain footwear, no drainage, or redness , wonders if gout, reassured that is not  C/o malodorous c vagina l discharge, h/o recurrent Bv requests flagyll ROS Denies recent fever or chills. Denies sinus pressure, nasal congestion, ear pain or sore throat. Denies chest congestion, productive cough or wheezing. Denies chest pains, palpitations and leg swelling Denies abdominal pain, nausea, vomiting,diarrhea or constipation.   Denies dysuria, frequency, hesitancy or incontinence. Denies uncontrolled joint pain, swelling and limitation in mobility. Denies headaches, seizures, . Denies depression, anxiety or insomnia. Denies skin break down or rash.   PE  BP 124/84   Pulse 82   Resp 15   Ht 5' (1.524 m)   Wt 158 lb (71.7 kg)   SpO2 98%   BMI 30.86 kg/m   Patient alert and oriented and in no cardiopulmonary distress.  HEENT: No facial asymmetry, EOMI,   oropharynx pink and moist.  Neck supple no JVD, no mass.  Chest: Clear to auscultation bilaterally.  CVS: S1, S2 no murmurs, no S3.Regular rate.  ABD: Soft non tender.   Ext: No edema  MS: Adequate ROM spine, shoulders, hips and knees.  Skin: Intact, no ulcerations or rash noted.  Psych: Good eye contact, normal affect. Memory intact not anxious or depressed  appearing.  CNS: CN 2-12 intact, power,  normal throughout.no focal deficits noted.   Assessment & Plan  Encounter for chronic pain management Pain control.adequate [place] registry reviewed and pt is compliant 12 weeks of med prescribed  Essential hypertension, benign Controlled, no change in medication DASH diet and commitment to daily physical activity for a minimum of 30 minutes discussed and encouraged, as a part of hypertension management. The importance of attaining a healthy weight is also discussed.  BP/Weight 11/14/2016 10/03/2016 08/13/2016 08/10/2016 05/10/2016 03/16/2016 9/74/1638  Systolic BP 453 646 803 212 248 250 037  Diastolic BP 84 82 85 82 88 82 81  Wt. (Lbs) 158 160 160 160 156 153 -  BMI 30.86 31.25 31.25 31.25 30.47 29.88 -  Some encounter information is confidential and restricted. Go to Review Flowsheets activity to see all data.       Vulvovaginitis 1 week h/o symptoms, of fishy d/c established recurrent BV , medication prescribed based on history  Overweight (BMI 25.0-29.9) improved Patient re-educated about  the importance of commitment to a  minimum of 150 minutes of exercise per week.  The importance of healthy food choices with portion control discussed. Encouraged to start a food diary, count calories and to consider  joining a support group. Sample diet sheets offered. Goals set by the patient for the next several months.   Weight /BMI 11/14/2016 10/03/2016 08/13/2016  WEIGHT 158 lb 160 lb 160 lb  HEIGHT 5\' 0"  5\' 0"  5\' 0"   BMI 30.86 kg/m2 31.25 kg/m2 31.25 kg/m2  Some encounter  information is confidential and restricted. Go to Review Flowsheets activity to see all data.      Vitamin D deficiency Complies with replacement  Asthma, mild intermittent increased with excess heat , albuterol refilled

## 2016-11-14 NOTE — Assessment & Plan Note (Signed)
1 week h/o symptoms, of fishy d/c established recurrent BV , medication prescribed based on history

## 2016-12-01 ENCOUNTER — Telehealth: Payer: Self-pay | Admitting: Family Medicine

## 2016-12-01 DIAGNOSIS — G43909 Migraine, unspecified, not intractable, without status migrainosus: Secondary | ICD-10-CM | POA: Diagnosis not present

## 2016-12-01 NOTE — Telephone Encounter (Signed)
Patient called and left message on nurse line. Asks for a work note for today- she has a terrible migraine.  Callback# [phone]

## 2016-12-01 NOTE — Telephone Encounter (Signed)
Unless her FMLA paperwork covers absence without treatment, I recommend she be evaluated and treated for te migraine headache

## 2016-12-04 ENCOUNTER — Ambulatory Visit (INDEPENDENT_AMBULATORY_CARE_PROVIDER_SITE_OTHER): Payer: BLUE CROSS/BLUE SHIELD | Admitting: Family Medicine

## 2016-12-04 ENCOUNTER — Encounter: Payer: Self-pay | Admitting: Family Medicine

## 2016-12-04 VITALS — BP 124/82 | HR 77 | Resp 16 | Ht 60.0 in | Wt 157.1 lb

## 2016-12-04 DIAGNOSIS — G44001 Cluster headache syndrome, unspecified, intractable: Secondary | ICD-10-CM | POA: Diagnosis not present

## 2016-12-04 DIAGNOSIS — I10 Essential (primary) hypertension: Secondary | ICD-10-CM | POA: Diagnosis not present

## 2016-12-04 MED ORDER — PREDNISONE 5 MG PO TABS: 5.0000 mg | ORAL_TABLET | Freq: Two times a day (BID) | ORAL | 0 refills | Status: AC

## 2016-12-04 MED ORDER — METHYLPREDNISOLONE ACETATE 80 MG/ML IJ SUSP
80.0000 mg | Freq: Once | INTRAMUSCULAR | Status: AC
  Administered 2016-12-04: 80 mg via INTRAMUSCULAR

## 2016-12-04 MED ORDER — KETOROLAC TROMETHAMINE 60 MG/2ML IM SOLN
60.0000 mg | Freq: Once | INTRAMUSCULAR | Status: AC
  Administered 2016-12-04: 60 mg via INTRAMUSCULAR

## 2016-12-04 NOTE — Assessment & Plan Note (Signed)
Headache since past 4 days, rated currently at an 8, on max topamax, and little response to immitrex, work excuse until Wednesday, however reports increased frequency  and severity of headaches in past 6 months, 2 to 3 times per week, reports bilateral scant epistaxis at times with the headache for past 1 month, recommend neurology eval asap

## 2016-12-04 NOTE — Telephone Encounter (Signed)
Patient was informed.

## 2016-12-04 NOTE — Progress Notes (Signed)
   Rebecca Boyd     MRN: [id number]      DOB: 02/04/1976   HPI [name] is here with a 4 day h/o headache currently rates it at an 8 , despite max dose of topamax, she reports increased headache frequency and severity of headaches in the past 6 months, on average 2 to 3 times per week Recently treated at urgent care this past Friday, prescribed immitrex, the headache persists, has work excuse from [employer] to return to work on 8/9, the hope is that she will see neurology prior to this and will also get some headache relief She denies vision or speech disturbance or any localized neurologic deficit  ROS Denies recent fever or chills. Denies sinus pressure, nasal congestion, ear pain or sore throat. Denies chest congestion, productive cough or wheezing. Denies chest pains, palpitations and leg swelling Denies abdominal pain, nausea, vomiting,diarrhea or constipation.   . Denies uncontrolled  depression, anxiety or insomnia. Denies skin break down or rash.   PE  BP 124/82   Pulse 77   Resp 16   Ht 5' (1.524 m)   Wt 157 lb 1.9 oz (71.3 kg)   SpO2 98%   BMI 30.69 kg/m   Patient alert and oriented and in no cardiopulmonary distress.  HEENT: No facial asymmetry, EOMI,   oropharynx pink and moist.  Neck supple no JVD, no mass.  Chest: Clear to auscultation bilaterally.  CVS: S1, S2 no murmurs, no S3.Regular rate.  ABD: Soft non tender.   Ext: No edema  Psych: Good eye contact, normal affect. Memory intact not anxious or depressed appearing.  CNS: CN 2-12 intact, power,  normal throughout.no focal deficits noted.   Assessment & Plan  Headache Headache since past 4 days, rated currently at an 8, on max topamax, and little response to immitrex, work excuse until Wednesday, however reports increased frequency  and severity of headaches in past 6 months, 2 to 3 times per week, reports bilateral scant epistaxis at times with the headache for past 1 month, recommend neurology eval  asap  Essential hypertension, benign Controlled, no change in medication DASH diet and commitment to daily physical activity for a minimum of 30 minutes discussed and encouraged, as a part of hypertension management. The importance of attaining a healthy weight is also discussed.  BP/Weight 12/06/2016 12/04/2016 11/14/2016 10/03/2016 08/13/2016 08/10/2016 4/46/2863  Systolic BP 817 711 657 903 833 383 291  Diastolic BP 916 82 84 82 85 82 88  Wt. (Lbs) 159.5 157.12 158 160 160 160 156  BMI 31.15 30.69 30.86 31.25 31.25 31.25 30.47  Some encounter information is confidential and restricted. Go to Review Flowsheets activity to see all data.

## 2016-12-04 NOTE — Patient Instructions (Addendum)
F/u as before, call if you need em sooner  You are referred urgently to neurology for headache management, stop at front to discuss  Toradol 60 mg and depo medrol 80 mg IM in te office and 5 day course of prednisone is sent in also   Stay out of work per urgent care until 12/06/2016

## 2016-12-06 ENCOUNTER — Encounter: Payer: Self-pay | Admitting: Neurology

## 2016-12-06 ENCOUNTER — Ambulatory Visit (INDEPENDENT_AMBULATORY_CARE_PROVIDER_SITE_OTHER): Payer: BLUE CROSS/BLUE SHIELD | Admitting: Neurology

## 2016-12-06 DIAGNOSIS — G43909 Migraine, unspecified, not intractable, without status migrainosus: Secondary | ICD-10-CM | POA: Insufficient documentation

## 2016-12-06 DIAGNOSIS — IMO0002 Reserved for concepts with insufficient information to code with codable children: Secondary | ICD-10-CM | POA: Insufficient documentation

## 2016-12-06 DIAGNOSIS — G43709 Chronic migraine without aura, not intractable, without status migrainosus: Secondary | ICD-10-CM | POA: Diagnosis not present

## 2016-12-06 MED ORDER — PROMETHAZINE HCL 25 MG PO TABS: 25.0000 mg | ORAL_TABLET | Freq: Four times a day (QID) | ORAL | 6 refills | Status: DC | PRN

## 2016-12-06 MED ORDER — NORTRIPTYLINE HCL 10 MG PO CAPS: 20.0000 mg | ORAL_CAPSULE | Freq: Every day | ORAL | 11 refills | Status: DC

## 2016-12-06 MED ORDER — NORTRIPTYLINE HCL 10 MG PO CAPS: 10.0000 mg | ORAL_CAPSULE | Freq: Every day | ORAL | 11 refills | Status: DC

## 2016-12-06 NOTE — Progress Notes (Signed)
PATIENT: Rebecca Boyd DOB: 12-04-1975  Chief Complaint  Patient presents with  . NP [place]  . Intractable cluster headache syndrome    asking for a note for back to work, pcp took out since last Friday.  Has elevated Bp, (has taken med this am).       HISTORICAL  Rebecca Boyd a 41 years old right-handed female, seen in refer by her primary care doctor Fayrene Helper, for evaluation of headache, initial evaluation was December 06 2016.  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  She reported a history of migraine headaches since 2007, used to be intermittent, couple times each months, but since June 2018, she reported increased headache, to almost daily bases, constant low-grade lateralized headache, frequent exacerbation to severe pounding headache with associated light noise sensitivity, nauseous, sometimes associated seeing flashlight prior to and during her severe migraine headache, her headache can last for couple days,  She was started on Topamax 100 mg twice a day about 2 months ago, with mild side effect, but no significant help, she also takes Imitrex 50 mg as needed, is helpful most of the time,  Trigger for her migraine headaches are weather change, bright light, strong smells,   REVIEW OF SYSTEMS: Full 14 system review of systems performed and notable only for headache, numbness, blurred vision, fatigue  ALLERGIES: Allergies  Allergen Reactions  . Diphenhydramine Hcl Anaphylaxis  . Orange Fruit Hives, Shortness Of Breath, Itching and Other (See Comments)    MAKES IT DIFFICULT TO BREATH  . Latex Rash    HOME MEDICATIONS: Current Outpatient Prescriptions  Medication Sig Dispense Refill  . albuterol (PROVENTIL HFA;VENTOLIN HFA) 108 (90 Base) MCG/ACT inhaler Inhale 2 puffs into the lungs every 6 (six) hours as needed for wheezing or shortness of breath.  1 Inhaler 1  . ALPRAZolam (XANAX) 1 MG tablet TAKE 1 TABLET BY MOUTH AT BEDTIME AS NEEDED FOR ANXIETY. 30 tablet 2  . amLODipine (NORVASC) 2.5 MG tablet Take 1 tablet (2.5 mg total) by mouth daily. 30 tablet 5  . fluticasone (FLONASE) 50 MCG/ACT nasal spray Place 2 sprays into both nostrils daily. (Patient taking differently: Place 2 sprays into both nostrils daily as needed for allergies. ) 16 g 1  . oxyCODONE-acetaminophen (PERCOCET/ROXICET) 5-325 MG tablet Take 1 tablet by mouth at bedtime. 30 tablet 0  . predniSONE (DELTASONE) 5 MG tablet Take 1 tablet (5 mg total) by mouth 2 (two) times daily. 10 tablet 0  . SUMAtriptan (IMITREX) 50 MG tablet Take 50 mg by mouth every 2 (two) hours as needed for migraine. May repeat in 2 hours if headache persists or recurs.    . topiramate (TOPAMAX) 100 MG tablet Take 1 tablet (100 mg total) by mouth 2 (two) times daily. 60 tablet 3   No current facility-administered medications for this visit.     PAST MEDICAL HISTORY: Past Medical History:  Diagnosis Date  . Anemia   . Anxiety   . Depressive disorder, not elsewhere classified   . Hypertension   . Migraine   . Migraines   . Miscarriage 2009  . Neck pain   . Vaginitis    cyctitis    PAST SURGICAL HISTORY: Past Surgical History:  Procedure Laterality Date  . cervical cryotherapy N/A 1999  . DILITATION & CURRETTAGE/HYSTROSCOPY WITH THERMACHOICE ABLATION  08/26/2013   Procedure: DILATATION & CURETTAGE/HYSTEROSCOPY WITH  THERMACHOICE ENDOMETRIAL ABLATION Procedure #2 Total Therapy Time=min       sec;  Surgeon: Jonnie Kind, MD;  Location: [facility];  Service: Gynecology;;  . LAPAROSCOPIC BILATERAL SALPINGECTOMY Bilateral 08/26/2013   Procedure: LAPAROSCOPIC BILATERAL SALPINGECTOMY AND REMOVAL OF LEFT PERITUBAL CYST Procedure #1;  Surgeon: Jonnie Kind, MD;  Location: [facility];  Service: Gynecology;  Laterality: Bilateral;  . LAPAROSCOPIC LYSIS OF ADHESIONS  08/26/2013   Procedure: LAPAROSCOPIC  LYSIS OF ADHESIONS Procedure #1;  Surgeon: Jonnie Kind, MD;  Location: [facility];  Service: Gynecology;;  . Carmelia Bake LESION REMOVAL  08/26/2013   Procedure: REMOVAL OF VULVAR SEBACEOUS CYST Procedure #3;  Surgeon: Jonnie Kind, MD;  Location: [facility];  Service: Gynecology;;    FAMILY HISTORY: Family History  Problem Relation Age of Onset  . Drug abuse Mother   . Hypertension Father   . Hypertension Maternal Grandmother   . Hypertension Maternal Grandfather   . Hypertension Paternal Grandmother     SOCIAL HISTORY:  Social History   Social History  . Marital status: Single    Spouse name: N/A  . Number of children: N/A  . Years of education: N/A   Occupational History  . Not on file.   Social History Main Topics  . Smoking status: Former Smoker    Packs/day: 0.25    Types: Cigars    Quit date: 01/06/2016  . Smokeless tobacco: Never Used  . Alcohol use Yes     Comment: occasional  . Drug use: No  . Sexual activity: Yes    Birth control/ protection: None   Other Topics Concern  . Not on file   Social History Narrative   Lives home with mother.  Works at [employer].  Education 10th grade/GED.  No children.  Single.       PHYSICAL EXAM   Vitals:   12/06/16 1041  BP: (!) 155/103  Pulse: 73  Weight: 159 lb 8 oz (72.3 kg)  Height: 5' (1.524 m)    Not recorded      Body mass index is 31.15 kg/m.  PHYSICAL EXAMNIATION:  Gen: NAD, conversant, well nourised, obese, well groomed                     Cardiovascular: Regular rate rhythm, no peripheral edema, warm, nontender. Eyes: Conjunctivae clear without exudates or hemorrhage Neck: Supple, no carotid bruits. Pulmonary: Clear to auscultation bilaterally   NEUROLOGICAL EXAM:  MENTAL STATUS: Speech:    Speech is normal; fluent and spontaneous with normal comprehension.  Cognition:     Orientation to time, place and person     Normal recent and remote memory     Normal Attention span and  concentration     Normal Language, naming, repeating,spontaneous speech     Fund of knowledge   CRANIAL NERVES: CN II: Visual fields are full to confrontation. Fundoscopic exam is normal with sharp discs and no vascular changes. Pupils are round equal and briskly reactive to light. CN III, IV, VI: extraocular movement are normal. No ptosis. CN V: Facial sensation is intact to pinprick in all 3 divisions bilaterally. Corneal responses are intact.  CN VII: Face is symmetric with normal eye closure and smile. CN VIII: Hearing is normal to rubbing fingers CN IX, X: Palate elevates symmetrically. Phonation is normal. CN XI: Head turning and shoulder shrug are intact CN XII: Tongue is midline with normal movements and no atrophy.  MOTOR: There is no pronator drift  of out-stretched arms. Muscle bulk and tone are normal. Muscle strength is normal.  REFLEXES: Reflexes are 2+ and symmetric at the biceps, triceps, knees, and ankles. Plantar responses are flexor.  SENSORY: Intact to light touch, pinprick, positional sensation and vibratory sensation are intact in fingers and toes.  COORDINATION: Rapid alternating movements and fine finger movements are intact. There is no dysmetria on finger-to-nose and heel-knee-shin.    GAIT/STANCE: Posture is normal. Gait is steady with normal steps, base, arm swing, and turning. Heel and toe walking are normal. Tandem gait is normal.  Romberg is absent.   DIAGNOSTIC DATA (LABS, IMAGING, TESTING) - I reviewed patient records, labs, notes, testing and imaging myself where available.   ASSESSMENT AND PLAN  Rebecca Boyd is a 41 y.o. female   Chronic migraine headache  Continue preventive medications Topamax 100 mg twice a day  Add on nortriptyline 10 mg titrating to 20 mg every night  Phenergan, and Imitrex as needed  [name], M.D. Ph.D.  [facility] [address] [place],  [address] Ph: [phone] Fax:  [phone]  [id number], Norwood Levo, MD

## 2016-12-08 ENCOUNTER — Telehealth: Payer: Self-pay | Admitting: Family Medicine

## 2016-12-08 NOTE — Telephone Encounter (Signed)
Left message that it would be addressed Monday

## 2016-12-08 NOTE — Assessment & Plan Note (Signed)
Controlled, no change in medication DASH diet and commitment to daily physical activity for a minimum of 30 minutes discussed and encouraged, as a part of hypertension management. The importance of attaining a healthy weight is also discussed.  BP/Weight 12/06/2016 12/04/2016 11/14/2016 10/03/2016 08/13/2016 08/10/2016 9/50/7225  Systolic BP 750 518 335 825 189 842 103  Diastolic BP 128 82 84 82 85 82 88  Wt. (Lbs) 159.5 157.12 158 160 160 160 156  BMI 31.15 30.69 30.86 31.25 31.25 31.25 30.47  Some encounter information is confidential and restricted. Go to Review Flowsheets activity to see all data.

## 2016-12-08 NOTE — Telephone Encounter (Signed)
°  Patient calling to request metronidazole  Requesting gel instead of tablet   Please call in to [facility]

## 2016-12-15 ENCOUNTER — Telehealth: Payer: Self-pay | Admitting: Neurology

## 2016-12-15 NOTE — Telephone Encounter (Signed)
LVM informing patient this call was made in error as Rebecca Boyd has already spoken to her today.  Advised she may disregard this call.

## 2016-12-15 NOTE — Telephone Encounter (Signed)
Patient called office in reference to letter she was given at visit on 12/06/16 for her job about her work restrictions.  Patient is saying that the letter said to do no more than 40 hours a weel, but her job is needing more information as to why she is needing to work less than 40 hrs a week and the reason why.  Patient would like to be called please when ready will be ready for pick up.

## 2016-12-15 NOTE — Telephone Encounter (Signed)
Am not sure her described nausea vomiting is due to such low dose of nortriptyline,  Will hold of imaging study at this point, continue taking Topamax, encouraged her continue on low-dose nortriptyline, if she actually could not tolerate it, she may stop it, Imitrex as needed for acute migraine,  Keep follow-up appointment as previously scheduled, but she can call our office if she has her long intractable migraine,

## 2016-12-15 NOTE — Telephone Encounter (Signed)
I called and spoke to pt.  She states she has not noted any change in her migraines, since taking the topamax, and pamelor.  She is having vomiting and diarrhea after taking the pamelor.  She normally works Teacher, adult education days, 7 days a week.  She stated that she feels that being on regular schedule, no OT for another 2 wks, till 01-02-17 would be helpful.  This to be placed on the letter.  When ready call her.   ? Getting MRI, or if needs sooner appt to discuss medications, letter request.

## 2016-12-18 ENCOUNTER — Encounter: Payer: Self-pay | Admitting: *Deleted

## 2016-12-18 NOTE — Telephone Encounter (Signed)
Letter redone and to Dr. Krista Blue for signature.

## 2016-12-18 NOTE — Telephone Encounter (Signed)
LMVM for pt that letter ready for pick up (bascially same letter but with added time of 2 wks 01-02-17).  She is to continue topamax, and low dose nortriptyline and if cannot tolerate to stop,  Use imitrex prn for acute migraine.  (not sure that n/v from nortriptyline).

## 2016-12-25 ENCOUNTER — Telehealth: Payer: Self-pay | Admitting: *Deleted

## 2016-12-25 NOTE — Telephone Encounter (Signed)
Called on a Friday with BV symptoms and requested gel instead of tabs.

## 2016-12-25 NOTE — Telephone Encounter (Signed)
Patient called stating she needs a medication filled that she requested last week for a bacteria infection. Please advise

## 2016-12-26 ENCOUNTER — Other Ambulatory Visit: Payer: Self-pay | Admitting: Family Medicine

## 2016-12-26 MED ORDER — METRONIDAZOLE 0.75 % VA GEL: 1.0000 | Freq: Two times a day (BID) | VAGINAL | 0 refills | Status: DC

## 2016-12-26 NOTE — Telephone Encounter (Signed)
Script is sent 

## 2016-12-29 ENCOUNTER — Telehealth: Payer: Self-pay | Admitting: *Deleted

## 2016-12-29 ENCOUNTER — Other Ambulatory Visit: Payer: Self-pay | Admitting: Family Medicine

## 2016-12-29 DIAGNOSIS — I10 Essential (primary) hypertension: Secondary | ICD-10-CM

## 2016-12-29 NOTE — Telephone Encounter (Signed)
Patient called and states she needs a refill of her Amlodipine. Her pharmacy is [facility] in [place] . Please advise. Thank you

## 2016-12-29 NOTE — Telephone Encounter (Signed)
Seen [phone]

## 2017-01-02 ENCOUNTER — Telehealth: Payer: Self-pay | Admitting: Neurology

## 2017-01-02 DIAGNOSIS — IMO0002 Reserved for concepts with insufficient information to code with codable children: Secondary | ICD-10-CM

## 2017-01-02 DIAGNOSIS — G43709 Chronic migraine without aura, not intractable, without status migrainosus: Secondary | ICD-10-CM

## 2017-01-02 NOTE — Telephone Encounter (Signed)
[name].  Per YY, ok for MRI brain without contrast/fim

## 2017-01-02 NOTE — Telephone Encounter (Signed)
Pt states she was told to call back if she was still having headaches so that steps could be taken for MRI.  Pt would like a call if there is any way to be seen before Nov. Pt is on wait list and would like a call re: if Dr Krista Blue thinks she should see her PCP first.

## 2017-01-03 MED ORDER — ALPRAZOLAM 1 MG PO TABS: ORAL_TABLET | ORAL | 0 refills | Status: DC

## 2017-01-03 MED ORDER — ALPRAZOLAM 1 MG PO TABS: 1.0000 mg | ORAL_TABLET | Freq: Every evening | ORAL | 0 refills | Status: DC | PRN

## 2017-01-03 NOTE — Telephone Encounter (Signed)
As needed for MRI

## 2017-01-03 NOTE — Addendum Note (Signed)
Addended by: [name] on: 01/03/2017 05:06 PM   Modules accepted: Orders

## 2017-01-03 NOTE — Addendum Note (Signed)
Addended by: Noberto Retort C on: 01/03/2017 05:18 PM   Modules accepted: Orders

## 2017-01-03 NOTE — Telephone Encounter (Signed)
I left a voicemail for the patient to call me back about scheduling her MRI at [facility].

## 2017-01-03 NOTE — Telephone Encounter (Signed)
Left message for a return call.  If patient would like to have the requested scan, Dr. Krista Blue has approved her having a brain MRI without contrast.  Once the patient calls back to confirm that she wants to proceed with the test, the order can be placed and she will receive a call back for scheduling.

## 2017-01-03 NOTE — Telephone Encounter (Signed)
Patient is scheduled to have MRI done at [facility] for Thursday 01/11/17 arrival time is 8:45 AM. Patient is aware of time & day. She did inform me that she is claustrophic and needs something to calm her. Also, the scheduler at [facility] stated that Dr. Krista Blue needed to e-sign the order since Sharyn Lull put it in.

## 2017-01-03 NOTE — Telephone Encounter (Signed)
Left a voicemail for patient to call back about scheduling MRI at [facility].

## 2017-01-03 NOTE — Telephone Encounter (Signed)
Spoke to patient - she would like to proceed with the MRI.

## 2017-01-03 NOTE — Telephone Encounter (Signed)
Alprazolam rx. faxed to [facility]

## 2017-01-03 NOTE — Telephone Encounter (Signed)
Left detailed message (ok per DPR) that if she would like to proceed with MRI to please call us back.  Provided our office number.

## 2017-01-03 NOTE — Addendum Note (Signed)
Addended by: Noberto Retort C on: 01/03/2017 01:08 PM   Modules accepted: Orders

## 2017-01-03 NOTE — Telephone Encounter (Signed)
Pt has called back and is asking for a call back, if she does not answer she is at work and will call back

## 2017-01-04 ENCOUNTER — Encounter: Payer: Self-pay | Admitting: Neurology

## 2017-01-04 ENCOUNTER — Ambulatory Visit (INDEPENDENT_AMBULATORY_CARE_PROVIDER_SITE_OTHER): Payer: BLUE CROSS/BLUE SHIELD | Admitting: Family Medicine

## 2017-01-04 ENCOUNTER — Ambulatory Visit (INDEPENDENT_AMBULATORY_CARE_PROVIDER_SITE_OTHER): Payer: BLUE CROSS/BLUE SHIELD | Admitting: Neurology

## 2017-01-04 ENCOUNTER — Encounter: Payer: Self-pay | Admitting: Family Medicine

## 2017-01-04 ENCOUNTER — Telehealth: Payer: Self-pay | Admitting: Neurology

## 2017-01-04 VITALS — BP 132/90 | HR 79 | Ht 65.0 in | Wt 160.5 lb

## 2017-01-04 VITALS — BP 126/80 | HR 84 | Temp 99.1°F | Ht 60.0 in | Wt 160.0 lb

## 2017-01-04 DIAGNOSIS — I10 Essential (primary) hypertension: Secondary | ICD-10-CM | POA: Diagnosis not present

## 2017-01-04 DIAGNOSIS — IMO0002 Reserved for concepts with insufficient information to code with codable children: Secondary | ICD-10-CM

## 2017-01-04 DIAGNOSIS — G43709 Chronic migraine without aura, not intractable, without status migrainosus: Secondary | ICD-10-CM | POA: Diagnosis not present

## 2017-01-04 DIAGNOSIS — N946 Dysmenorrhea, unspecified: Secondary | ICD-10-CM | POA: Diagnosis not present

## 2017-01-04 DIAGNOSIS — G4489 Other headache syndrome: Secondary | ICD-10-CM | POA: Diagnosis not present

## 2017-01-04 DIAGNOSIS — X38XXXA Flood, initial encounter: Secondary | ICD-10-CM

## 2017-01-04 LAB — POCT URINE PREGNANCY: PREG TEST UR: NEGATIVE

## 2017-01-04 MED ORDER — TIZANIDINE HCL 4 MG PO TABS: 4.0000 mg | ORAL_TABLET | Freq: Four times a day (QID) | ORAL | 6 refills | Status: DC | PRN

## 2017-01-04 MED ORDER — RIZATRIPTAN BENZOATE 10 MG PO TBDP: 10.0000 mg | ORAL_TABLET | ORAL | 6 refills | Status: DC | PRN

## 2017-01-04 MED ORDER — TOPIRAMATE 100 MG PO TABS: 100.0000 mg | ORAL_TABLET | Freq: Two times a day (BID) | ORAL | 11 refills | Status: DC

## 2017-01-04 NOTE — Telephone Encounter (Signed)
She is experiencing continued headaches.  She has been worked into the schedule today.

## 2017-01-04 NOTE — Progress Notes (Signed)
   Rebecca Boyd     MRN: [id number]      DOB: 12/12/1975   HPI Rebecca Boyd is here c/o clots and abdominal pain and generalized aches which started on Tuesday, still having vaginal bleeding which has lighteneded, wonders if she may have been pregnant as clotting was abnormal States stressed , headache rated a t 7 currently, c/o stress and overwhelmed at work, states works in 120 degree temp 7 days per week, requests 3 days off, starting in am ROS Denies recent fever or chills. Denies sinus pressure, nasal congestion, ear pain or sore throat. Denies chest congestion, productive cough or wheezing. Denies chest pains, palpitations and leg swelling Denies , vomiting,diarrhea or constipation.   Denies dysuria, frequency, hesitancy or incontinence. Denies joint pain, swelling and limitation in mobility. Denies  seizures, numbness, or tingling.  Denies skin break down or rash.   PE  BP 126/80 (BP Location: Left Arm, Patient Position: Sitting, Cuff Size: Normal)   Pulse 84   Temp 99.1 F (37.3 C)   Ht 5' (1.524 m)   Wt 160 lb (72.6 kg)   SpO2 98%   BMI 31.25 kg/m   Patient alert and oriented and in no cardiopulmonary distress.  HEENT: No facial asymmetry, EOMI,   oropharynx pink and moist.  Neck supple no JVD, no mass.  Chest: Clear to auscultation bilaterally.  CVS: S1, S2 no murmurs, no S3.Regular rate.  ABD: Soft mild lowe abdominal tenderness, no guarding or rebound, normal BS  Ext: No edema  MS: Adequate ROM spine, shoulders, hips and knees.  Skin: Intact, no ulcerations or rash noted.  Psych: Good eye contact, normal affect. Memory intact mildly  anxious not  depressed appearing.  CNS: CN 2-12 intact, power,  normal throughout.no focal deficits noted.   Assessment & Plan  Dysmenorrhea Heavy painful cycle currently , concerned re possible pregnancy loss in progress wants to be tested Urine pregnancy test is negative  Flooding, initial encounter reports heavy  menstrual flow this cycle concerned re possible pregnancy urine pregnancy test is negative  Headache Increased headache due to stress and anxiety , needs time from work to recover. orlk excuse for next 3 days Continue to follow with neurology  Essential hypertension, benign Controlled, no change in medication DASH diet and commitment to daily physical activity for a minimum of 30 minutes discussed and encouraged, as a part of hypertension management. The importance of attaining a healthy weight is also discussed.  BP/Weight 01/04/2017 01/04/2017 12/06/2016 12/04/2016 11/14/2016 10/03/2016 7/62/2633  Systolic BP 354 562 563 893 734 287 681  Diastolic BP 90 80 157 82 84 82 85  Wt. (Lbs) 160.5 160 159.5 157.12 158 160 160  BMI 26.71 31.25 31.15 30.69 30.86 31.25 31.25  Some encounter information is confidential and restricted. Go to Review Flowsheets activity to see all data.

## 2017-01-04 NOTE — Progress Notes (Signed)
PATIENT: Rebecca Boyd DOB: 1975-08-01  Chief Complaint  Patient presents with  . Migraine    She is having continued problems with headaches.  Sumatriptan is only mildly helpful.  She has a brain MRI pending on 01/11/17.     HISTORICAL  Rebecca Boyd a 41 years old right-handed female, seen in refer by her primary care doctor Fayrene Helper, for evaluation of headache, initial evaluation was December 06 2016.  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  She reported a history of migraine headaches since 2007, used to be intermittent, couple times each months, but since June 2018, she reported increased headache, to almost daily bases, constant low-grade lateralized headache, frequent exacerbation to severe pounding headache with associated light noise sensitivity, nauseous, sometimes associated seeing flashlight prior to and during her severe migraine headache, her headache can last for couple days,  She was started on Topamax 100 mg twice a day about 2 months ago, with mild side effect, but no significant help, she also takes Imitrex 50 mg as needed, is helpful most of the time,  Trigger for her migraine headaches are weather change, bright light, strong smells,  UPDATE Sept 6 2018: She has tried imitrex 50mg  as needed few times over past one month, she throw up each time.  Today, she complains of 2 weeks of severe left parietal area sharp headaches,she has tried imitrex, phenergan did not help.   She is able to tolerate topamax 100mg  bid, and nortriptyline 10 mg 2 tablets every night,  REVIEW OF SYSTEMS: Full 14 system review of systems performed and notable only for headache, numbness, blurred vision, fatigue  ALLERGIES: Allergies  Allergen Reactions  . Diphenhydramine Hcl Anaphylaxis  . Orange Fruit Hives, Shortness Of Breath, Itching and Other (See Comments)   MAKES IT DIFFICULT TO BREATH  . Latex Rash    HOME MEDICATIONS: Current Outpatient Prescriptions  Medication Sig Dispense Refill  . albuterol (PROVENTIL HFA;VENTOLIN HFA) 108 (90 Base) MCG/ACT inhaler Inhale 2 puffs into the lungs every 6 (six) hours as needed for wheezing or shortness of breath. 1 Inhaler 1  . ALPRAZolam (XANAX) 1 MG tablet Take 1-2 tablets thirty minutes prior to MRI.  May repeat with 1 additional tablet prior to entering scanner, if needed.  Must have driver. 3 tablet 0  . amLODipine (NORVASC) 2.5 MG tablet TAKE 1 TABLET BY MOUTH ONCE DAILY 90 tablet 1  . fluticasone (FLONASE) 50 MCG/ACT nasal spray Place 2 sprays into both nostrils daily. (Patient taking differently: Place 2 sprays into both nostrils daily as needed for allergies. ) 16 g 1  . metroNIDAZOLE (METROGEL) 0.75 % vaginal gel Place 1 Applicatorful vaginally 2 (two) times daily. 70 g 0  . nortriptyline (PAMELOR) 10 MG capsule Take 2 capsules (20 mg total) by mouth at bedtime. 60 capsule 11  . oxyCODONE-acetaminophen (PERCOCET/ROXICET) 5-325 MG tablet Take 1 tablet by mouth at bedtime. 30 tablet 0  . promethazine (PHENERGAN) 25 MG tablet Take 1 tablet (25 mg total) by mouth every 6 (six) hours as needed for nausea or vomiting. 30 tablet 6  . SUMAtriptan (IMITREX) 50 MG tablet Take 50 mg by mouth every 2 (two) hours as needed for migraine. May repeat in 2 hours if headache persists or recurs.    . topiramate (TOPAMAX) 100 MG tablet Take 1 tablet (100 mg total) by mouth 2 (two) times  daily. 60 tablet 3   No current facility-administered medications for this visit.     PAST MEDICAL HISTORY: Past Medical History:  Diagnosis Date  . Anemia   . Anxiety   . Depressive disorder, not elsewhere classified   . Hypertension   . Migraine   . Migraines   . Miscarriage 2009  . Neck pain   . Vaginitis    cyctitis    PAST SURGICAL HISTORY: Past Surgical History:  Procedure Laterality Date  . cervical cryotherapy N/A  1999  . DILITATION & CURRETTAGE/HYSTROSCOPY WITH THERMACHOICE ABLATION  08/26/2013   Procedure: DILATATION & CURETTAGE/HYSTEROSCOPY WITH THERMACHOICE ENDOMETRIAL ABLATION Procedure #2 Total Therapy Time=min       sec;  Surgeon: Jonnie Kind, MD;  Location: [facility];  Service: Gynecology;;  . LAPAROSCOPIC BILATERAL SALPINGECTOMY Bilateral 08/26/2013   Procedure: LAPAROSCOPIC BILATERAL SALPINGECTOMY AND REMOVAL OF LEFT PERITUBAL CYST Procedure #1;  Surgeon: Jonnie Kind, MD;  Location: [facility];  Service: Gynecology;  Laterality: Bilateral;  . LAPAROSCOPIC LYSIS OF ADHESIONS  08/26/2013   Procedure: LAPAROSCOPIC LYSIS OF ADHESIONS Procedure #1;  Surgeon: Jonnie Kind, MD;  Location: [facility];  Service: Gynecology;;  . Carmelia Bake LESION REMOVAL  08/26/2013   Procedure: REMOVAL OF VULVAR SEBACEOUS CYST Procedure #3;  Surgeon: Jonnie Kind, MD;  Location: [facility];  Service: Gynecology;;    FAMILY HISTORY: Family History  Problem Relation Age of Onset  . Drug abuse Mother   . Hypertension Father   . Hypertension Maternal Grandmother   . Hypertension Maternal Grandfather   . Hypertension Paternal Grandmother     SOCIAL HISTORY:  Social History   Social History  . Marital status: Single    Spouse name: N/A  . Number of children: N/A  . Years of education: N/A   Occupational History  . Not on file.   Social History Main Topics  . Smoking status: Former Smoker    Packs/day: 0.25    Types: Cigars    Quit date: 01/06/2016  . Smokeless tobacco: Never Used  . Alcohol use Yes     Comment: occasional  . Drug use: No  . Sexual activity: Yes    Birth control/ protection: None   Other Topics Concern  . Not on file   Social History Narrative   Lives home with mother.  Works at [employer].  Education 10th grade/GED.  No children.  Single.       PHYSICAL EXAM   Vitals:   01/04/17 1429  BP: 132/90  Pulse: 79  Weight: 160 lb 8 oz (72.8 kg)  Height: 5\' 5"  (1.651 m)    Not  recorded      Body mass index is 26.71 kg/m.  PHYSICAL EXAMNIATION:  Gen: NAD, conversant, well nourised, obese, well groomed                     Cardiovascular: Regular rate rhythm, no peripheral edema, warm, nontender. Eyes: Conjunctivae clear without exudates or hemorrhage Neck: Supple, no carotid bruits. Pulmonary: Clear to auscultation bilaterally   NEUROLOGICAL EXAM:  MENTAL STATUS: Speech:    Speech is normal; fluent and spontaneous with normal comprehension.  Cognition:     Orientation to time, place and person     Normal recent and remote memory     Normal Attention span and concentration     Normal Language, naming, repeating,spontaneous speech     Fund of knowledge   CRANIAL NERVES: CN II: Visual fields are  full to confrontation. Fundoscopic exam is normal with sharp discs and no vascular changes. Pupils are round equal and briskly reactive to light. CN III, IV, VI: extraocular movement are normal. No ptosis. CN V: Facial sensation is intact to pinprick in all 3 divisions bilaterally. Corneal responses are intact.  CN VII: Face is symmetric with normal eye closure and smile. CN VIII: Hearing is normal to rubbing fingers CN IX, X: Palate elevates symmetrically. Phonation is normal. CN XI: Head turning and shoulder shrug are intact CN XII: Tongue is midline with normal movements and no atrophy.  MOTOR: There is no pronator drift of out-stretched arms. Muscle bulk and tone are normal. Muscle strength is normal.  REFLEXES: Reflexes are 2+ and symmetric at the biceps, triceps, knees, and ankles. Plantar responses are flexor.  SENSORY: Intact to light touch, pinprick, positional sensation and vibratory sensation are intact in fingers and toes.  COORDINATION: Rapid alternating movements and fine finger movements are intact. There is no dysmetria on finger-to-nose and heel-knee-shin.    GAIT/STANCE: Posture is normal. Gait is steady with normal steps, base, arm  swing, and turning. Heel and toe walking are normal. Tandem gait is normal.  Romberg is absent.   DIAGNOSTIC DATA (LABS, IMAGING, TESTING) - I reviewed patient records, labs, notes, testing and imaging myself where available.   ASSESSMENT AND PLAN  JASMON MATTICE is a 41 y.o. female   Chronic migraine headache  Continue preventive medications Topamax 100 mg twice a day  Add on nortriptyline 10 mg titrating to 20 mg every night  Phenergan, and Maxalt, may mix together with tizanidine, Aleve as needed  I will refer her to Dr. Jaynee Eagles for continued care of her chronic migraine  Protracted prolonged headache  Also perform nerve block, and trigger point injection today,  [name], M.D. Ph.D.  [facility] [address] [place], [place] [address] Ph: [phone] Fax: [phone]  LX:[id number], Norwood Levo, MD

## 2017-01-04 NOTE — Progress Notes (Signed)
**  Bupivacaine 0.5%, Yell [id number], Lot [id number], Exp 06/2018, office supply.//mck,rn**

## 2017-01-04 NOTE — Patient Instructions (Addendum)
Physical exam as before  Urine pregnancy test in office today  Reconsider fly vaccine need this  Work excuse  From 09/0/7, 8 and /12/2017

## 2017-01-04 NOTE — Telephone Encounter (Signed)
Patient called office in reference to see if she is able to get a work note due to headaches.  She works today 11am-11pm but isn't sure she will be able to stay all day.  She would like to know if she is able to get the note for [place] and return back on Monday possibly.  Please call

## 2017-01-04 NOTE — Telephone Encounter (Signed)
Noted, thank you

## 2017-01-05 NOTE — Progress Notes (Signed)
   History:  41 years old female with history of chronic migraine headache presented with 2 weeks history of left side severe pounding headache, failed home remedy multiple times  Bilateral occipital and trigeminal nerve block; trigger point injection of bilateral cervical and upper trapezius muscles for intractable headache  Bupivacaine 0.5% was injected on the scalp bilaterally at several locations:  -On the occipital area of the head, 3 injections each side, 0.5 cc per injection at the midpoint between the mastoid process and the occipital protuberance. 2 other injections were done one finger breadth from the initial injection, one at a 10 o'clock position and the other at a 2 o'clock position.  -2 injections of 0.5 cc were done in the temporal regions, 2 fingerbreadths above the tragus of the ear, with the second injection one fingerbreadth posteriorly to the first.  -2 injections were done on the brow, 1 in the medial brow and one over the supraorbital nerve notch, with 0.1 cc for each injection  -1 injection each side of 0.5 cc was done anterior to the tragus of the ear for a trigeminal ganglion injection  -0.5 cc was injected into bilateral upper trapezius and bilateral upper cervical paraspinals   The patient tolerated the injections well, no complications of the procedure were noted. Injections were made with a 27-gauge needle.

## 2017-01-09 ENCOUNTER — Telehealth: Payer: Self-pay | Admitting: Neurology

## 2017-01-09 NOTE — Telephone Encounter (Signed)
I don't know how the e-sign works

## 2017-01-09 NOTE — Telephone Encounter (Signed)
I called [facility] and spoke to [facility] and she said that the doctor e-signs it off of epic. But she did also tell me I could fax the order to them.

## 2017-01-09 NOTE — Telephone Encounter (Signed)
Tanisha with centralized scheduling is calling to get order for MRI e-signed by Dr. Krista Blue.

## 2017-01-10 NOTE — Telephone Encounter (Signed)
I called the scheduler line and spoke to Manuela Schwartz she states that she did receive my fax from this morning and it has been uploaded now and  that it is signed and she is good to go.

## 2017-01-10 NOTE — Telephone Encounter (Signed)
[email or web address] has called to inform that the orders have not been signed and an ESig is needed, if not done by 3pm pt will have to be rescheduled

## 2017-01-10 NOTE — Telephone Encounter (Signed)
Faxed order

## 2017-01-11 ENCOUNTER — Ambulatory Visit (HOSPITAL_COMMUNITY)
Admission: RE | Admit: 2017-01-11 | Discharge: 2017-01-11 | Disposition: A | Discharge status: Home / Self Care | Payer: BLUE CROSS/BLUE SHIELD | Source: Ambulatory Visit | Attending: Neurology | Admitting: Neurology

## 2017-01-11 DIAGNOSIS — R51 Headache: Secondary | ICD-10-CM | POA: Diagnosis not present

## 2017-01-11 DIAGNOSIS — G43709 Chronic migraine without aura, not intractable, without status migrainosus: Secondary | ICD-10-CM | POA: Diagnosis not present

## 2017-01-11 DIAGNOSIS — IMO0002 Reserved for concepts with insufficient information to code with codable children: Secondary | ICD-10-CM

## 2017-01-14 ENCOUNTER — Encounter: Payer: Self-pay | Admitting: Family Medicine

## 2017-01-14 DIAGNOSIS — N946 Dysmenorrhea, unspecified: Secondary | ICD-10-CM | POA: Insufficient documentation

## 2017-01-14 DIAGNOSIS — X38XXXA Flood, initial encounter: Secondary | ICD-10-CM | POA: Insufficient documentation

## 2017-01-14 NOTE — Assessment & Plan Note (Signed)
reports heavy menstrual flow this cycle concerned re possible pregnancy urine pregnancy test is negative

## 2017-01-14 NOTE — Assessment & Plan Note (Signed)
Heavy painful cycle currently , concerned re possible pregnancy loss in progress wants to be tested Urine pregnancy test is negative

## 2017-01-14 NOTE — Assessment & Plan Note (Signed)
Controlled, no change in medication DASH diet and commitment to daily physical activity for a minimum of 30 minutes discussed and encouraged, as a part of hypertension management. The importance of attaining a healthy weight is also discussed.  BP/Weight 01/04/2017 01/04/2017 12/06/2016 12/04/2016 11/14/2016 10/03/2016 1/46/4314  Systolic BP 276 701 100 349 611 643 539  Diastolic BP 90 80 122 82 84 82 85  Wt. (Lbs) 160.5 160 159.5 157.12 158 160 160  BMI 26.71 31.25 31.15 30.69 30.86 31.25 31.25  Some encounter information is confidential and restricted. Go to Review Flowsheets activity to see all data.

## 2017-01-14 NOTE — Assessment & Plan Note (Signed)
Increased headache due to stress and anxiety , needs time from work to recover. orlk excuse for next 3 days Continue to follow with neurology

## 2017-01-25 ENCOUNTER — Telehealth: Payer: Self-pay | Admitting: Family Medicine

## 2017-01-25 NOTE — Telephone Encounter (Signed)
Patient is requesting a refill of amlodipine in pill form  [facility].

## 2017-01-30 NOTE — Telephone Encounter (Signed)
Pt asking for flagyl.?

## 2017-02-13 NOTE — Telephone Encounter (Signed)
Message is very unclear and I have not seen any further request from the patient so I am closing

## 2017-02-15 ENCOUNTER — Encounter: Payer: Self-pay | Admitting: *Deleted

## 2017-02-15 ENCOUNTER — Other Ambulatory Visit: Payer: Self-pay

## 2017-02-15 ENCOUNTER — Telehealth: Payer: Self-pay | Admitting: Neurology

## 2017-02-15 ENCOUNTER — Ambulatory Visit (INDEPENDENT_AMBULATORY_CARE_PROVIDER_SITE_OTHER): Payer: BLUE CROSS/BLUE SHIELD | Admitting: Family Medicine

## 2017-02-15 VITALS — BP 118/82 | HR 62 | Temp 97.2°F | Resp 62 | Ht 60.0 in | Wt 158.2 lb

## 2017-02-15 DIAGNOSIS — R350 Frequency of micturition: Secondary | ICD-10-CM | POA: Diagnosis not present

## 2017-02-15 DIAGNOSIS — E663 Overweight: Secondary | ICD-10-CM

## 2017-02-15 DIAGNOSIS — B9689 Other specified bacterial agents as the cause of diseases classified elsewhere: Secondary | ICD-10-CM

## 2017-02-15 DIAGNOSIS — N760 Acute vaginitis: Secondary | ICD-10-CM | POA: Diagnosis not present

## 2017-02-15 DIAGNOSIS — G8929 Other chronic pain: Secondary | ICD-10-CM | POA: Diagnosis not present

## 2017-02-15 DIAGNOSIS — I10 Essential (primary) hypertension: Secondary | ICD-10-CM

## 2017-02-15 DIAGNOSIS — G43709 Chronic migraine without aura, not intractable, without status migrainosus: Secondary | ICD-10-CM | POA: Diagnosis not present

## 2017-02-15 LAB — POCT URINALYSIS DIPSTICK
BILIRUBIN UA: NEGATIVE
GLUCOSE UA: NEGATIVE
Ketones, UA: NEGATIVE
Leukocytes, UA: NEGATIVE
Nitrite, UA: NEGATIVE
Protein, UA: NEGATIVE
RBC UA: NEGATIVE
Spec Grav, UA: 1.015 (ref 1.010–1.025)
UROBILINOGEN UA: 0.2 U/dL
pH, UA: 7 (ref 5.0–8.0)

## 2017-02-15 MED ORDER — OXYCODONE-ACETAMINOPHEN 5-325 MG PO TABS
ORAL_TABLET | ORAL | 0 refills | Status: DC
Start: 2017-02-15 — End: 2017-05-14

## 2017-02-15 MED ORDER — METRONIDAZOLE 500 MG PO TABS: 500.0000 mg | ORAL_TABLET | Freq: Two times a day (BID) | ORAL | 0 refills | Status: DC

## 2017-02-15 MED ORDER — FLUCONAZOLE 150 MG PO TABS: ORAL_TABLET | ORAL | 0 refills | Status: DC

## 2017-02-15 MED ORDER — OXYCODONE-ACETAMINOPHEN 5-325 MG PO TABS: ORAL_TABLET | ORAL | 0 refills | Status: DC

## 2017-02-15 MED ORDER — OXYCODONE-ACETAMINOPHEN 5-325 MG PO TABS: 1.0000 | ORAL_TABLET | Freq: Every day | ORAL | 0 refills | Status: DC

## 2017-02-15 NOTE — Telephone Encounter (Signed)
Spoke to patient - she is unable to break migraine with home medications.  She has tried repeat doses of tizanidine, rizatriptan and promethazine.  States triggers point and nerve block injections in Sept were mostly helpful.  She would like to try an infusion today, if possible.  I have verified her allergies in the chart.  She has a driver available to her.  [facility] can add her to the schedule at 1pm today.

## 2017-02-15 NOTE — Patient Instructions (Addendum)
F/u last week in December , call if you need me sooner  You have no urinary tract infection  Blood pressure is good   Chronic medications are the same  One week course of medication is prescribed for your infection, and one tablet for yeast infection is prescribed if needed on completion of the antibiotic course  Thank you  for choosing [facility]. We consider it a privelige to serve you.  Delivering excellent health care in a caring and  compassionate way is our goal.  Partnering with you,  so that together we can achieve this goal is our strategy.

## 2017-02-15 NOTE — Progress Notes (Signed)
Rebecca Boyd     MRN: [id number]      DOB: 1975-06-14   HPI Rebecca Boyd is here for follow up and re-evaluation of chronic medical conditions, medication management, in  particular chronic pain management,  review of any available recent lab and radiology dataa.  Preventive health is updated, specifically  Cancer screening and Immunization.   Questions or concerns regarding consultations or procedures which the PT has had in the interim are  Addressed.Is being followed by neurology fpor her headaches C/o chronic fatigue, requesting sick time again, but this is because of work environment which requires  6 to 7 day per week 8 to 12 hr shifts per patient reporting. . At this visit , there is no medical reason for me to take her out of work and I explain this to her The PT denies any adverse reactions to current medications since the last visit.  Bilateral cramping flank pains  For weeks, concerned she may have a kidney infection , with urinary frequency , and dysuria, no fever or chills,  Malodorous vaginal discharge , not heavy , has h/o recurrent BV  ROS Denies recent fever or chills. Denies sinus pressure, nasal congestion, ear pain or sore throat. Denies chest congestion, productive cough or wheezing. Denies chest pains, palpitations and leg swelling Denies abdominal pain, nausea, vomiting,diarrhea or constipation.   . Denies uncontrolled   joint pain, swelling and limitation in mobility. Denies headaches, seizures, numbness, or tingling. Denies depression, c/o anxiety and nsomnia. Denies skin break down or rash.   PE  BP 118/82 (BP Location: Left Arm, Patient Position: Sitting, Cuff Size: Normal)   Pulse 62   Temp (!) 97.2 F (36.2 C) (Other (Comment))   Resp (!) 62   Ht 5' (1.524 m)   Wt 158 lb 4 oz (71.8 kg)   SpO2 99%   BMI 30.91 kg/m   Patient alert and oriented and in no cardiopulmonary distress.  HEENT: No facial asymmetry, EOMI,   oropharynx pink and moist.   Neck supple no JVD, no mass.  Chest: Clear to auscultation bilaterally.  CVS: S1, S2 no murmurs, no S3.Regular rate.  ABD: Soft , no renal angle or suprapubic tenderness  Ext: No edema  MS: Adequate ROM spine, shoulders, hips and knees.  Skin: Intact, no ulcerations or rash noted.  Psych: Good eye contact, normal affect. Memory intact not anxious or depressed appearing.  CNS: CN 2-12 intact, power,  normal throughout.no focal deficits noted.   Assessment & Plan  Encounter for chronic pain management The patient's Controlled Substance registry is reviewed and compliance confirmed. Adequacy of  Pain control and level of function is assessed. Medication dosing is adjusted as deemed appropriate. Twelve weeks of medication is prescribed , patient signs for the script and is provided with a follow up appointment between 11 to 12 weeks .   Essential hypertension, benign Controlled, no change in medication DASH diet and commitment to daily physical activity for a minimum of 30 minutes discussed and encouraged, as a part of hypertension management. The importance of attaining a healthy weight is also discussed.  BP/Weight 02/15/2017 01/04/2017 01/04/2017 12/06/2016 12/04/2016 5/64/3329 08/30/8839  Systolic BP 660 630 160 109 323 557 322  Diastolic BP 82 90 80 025 82 84 82  Wt. (Lbs) 158.25 160.5 160 159.5 157.12 158 160  BMI 30.91 26.71 31.25 31.15 30.69 30.86 31.25  Some encounter information is confidential and restricted. Go to Review Flowsheets activity to see all data.  Urinary frequency Normal UA on testing at office visit, pt reassured that she has no UTI  Bacterial vaginosis Symptoms consistent with BV which she has on recurrent basis , denies douching . Will treat with 1 week course of oral flagyll presumptively. Denies dyspareunia or excess vaginal d/c  Overweight (BMI 25.0-29.9) Improved. Patient re-educated about  the importance of commitment to a  minimum of 150  minutes of exercise per week.  The importance of healthy food choices with portion control discussed. Encouraged to start a food diary, count calories and to consider  joining a support group. Sample diet sheets offered. Goals set by the patient for the next several months.   Weight /BMI 02/15/2017 01/04/2017 01/04/2017  WEIGHT 158 lb 4 oz 160 lb 8 oz 160 lb  HEIGHT 5\' 0"  5\' 5"  5\' 0"   BMI 30.91 kg/m2 26.71 kg/m2 31.25 kg/m2  Some encounter information is confidential and restricted. Go to Review Flowsheets activity to see all data.

## 2017-02-15 NOTE — Telephone Encounter (Signed)
Per vo by Dr. Krista Blue, patient may have the following infusion:  1) Depakote 1 gram 2) Toradol 30mg  3) Compazine 10mg   She will be here at 1pm today.  Signed MD orders have been provided to [facility] in [place].

## 2017-02-15 NOTE — Telephone Encounter (Signed)
ERROR

## 2017-02-15 NOTE — Telephone Encounter (Signed)
Pt states she has taken her medication and it has not helped the terrible headache she is having.  Pt would like to know if it is anyway possible for her to be able to come in today.  Pt is asking to be called

## 2017-02-16 NOTE — Telephone Encounter (Signed)
Pt states that she needs a note for work re: her FMLA the note needs to state that she was seen for a short notice appointment and that she is to return to work 63-22.  Pt states her job told her it needs to state 4 days time time frame to ensure she is paid.  Please call

## 2017-02-16 NOTE — Telephone Encounter (Signed)
She was written out of work the day of her infusion and one day of rest.  She is scheduled to work on Sunday and is able to return to work, without restrictions.  She is aware.

## 2017-02-19 ENCOUNTER — Encounter: Payer: Self-pay | Admitting: Family Medicine

## 2017-02-19 DIAGNOSIS — R350 Frequency of micturition: Secondary | ICD-10-CM | POA: Insufficient documentation

## 2017-02-19 NOTE — Assessment & Plan Note (Signed)
The patient's Controlled Substance registry is reviewed and compliance confirmed. Adequacy of  Pain control and level of function is assessed. Medication dosing is adjusted as deemed appropriate. Twelve weeks of medication is prescribed , patient signs for the script and is provided with a follow up appointment between 11 to 12 weeks .  

## 2017-02-19 NOTE — Assessment & Plan Note (Signed)
Normal UA on testing at office visit, pt reassured that she has no UTI

## 2017-02-19 NOTE — Assessment & Plan Note (Signed)
Controlled, no change in medication DASH diet and commitment to daily physical activity for a minimum of 30 minutes discussed and encouraged, as a part of hypertension management. The importance of attaining a healthy weight is also discussed.  BP/Weight 02/15/2017 01/04/2017 01/04/2017 12/06/2016 12/04/2016 8/83/3744 08/30/4602  Systolic BP 799 872 158 727 [phone]  Diastolic BP 82 90 80 639 82 84 82  Wt. (Lbs) 158.25 160.5 160 159.5 157.12 158 160  BMI 30.91 26.71 31.25 31.15 30.69 30.86 31.25  Some encounter information is confidential and restricted. Go to Review Flowsheets activity to see all data.

## 2017-02-19 NOTE — Assessment & Plan Note (Signed)
Improved. Patient re-educated about  the importance of commitment to a  minimum of 150 minutes of exercise per week.  The importance of healthy food choices with portion control discussed. Encouraged to start a food diary, count calories and to consider  joining a support group. Sample diet sheets offered. Goals set by the patient for the next several months.   Weight /BMI 02/15/2017 01/04/2017 01/04/2017  WEIGHT 158 lb 4 oz 160 lb 8 oz 160 lb  HEIGHT 5\' 0"  5\' 5"  5\' 0"   BMI 30.91 kg/m2 26.71 kg/m2 31.25 kg/m2  Some encounter information is confidential and restricted. Go to Review Flowsheets activity to see all data.

## 2017-02-19 NOTE — Assessment & Plan Note (Signed)
Symptoms consistent with BV which she has on recurrent basis , denies douching . Will treat with 1 week course of oral flagyll presumptively. Denies dyspareunia or excess vaginal d/c

## 2017-03-05 ENCOUNTER — Ambulatory Visit: Payer: BLUE CROSS/BLUE SHIELD | Admitting: Neurology

## 2017-03-08 ENCOUNTER — Ambulatory Visit: Payer: BLUE CROSS/BLUE SHIELD | Admitting: Neurology

## 2017-03-20 ENCOUNTER — Encounter: Payer: BLUE CROSS/BLUE SHIELD | Admitting: Family Medicine

## 2017-03-26 ENCOUNTER — Other Ambulatory Visit: Payer: Self-pay | Admitting: Family Medicine

## 2017-03-26 ENCOUNTER — Telehealth: Payer: Self-pay | Admitting: *Deleted

## 2017-03-26 MED ORDER — ALPRAZOLAM 1 MG PO TABS: 1.0000 mg | ORAL_TABLET | Freq: Every day | ORAL | 2 refills | Status: DC

## 2017-03-26 NOTE — Telephone Encounter (Signed)
Patient called stating her pharmacy sent over a request for her xanax to be refilled, patient states she needs this refilled today. Please advise [phone]

## 2017-03-26 NOTE — Telephone Encounter (Signed)
Script printed , I will sign , has been on bedtime xanax on regular basis

## 2017-03-26 NOTE — Telephone Encounter (Signed)
I don't see xanax on her list. Does she take on a regular basis?

## 2017-03-26 NOTE — Progress Notes (Signed)
xanax

## 2017-03-26 NOTE — Telephone Encounter (Signed)
Med faxed   

## 2017-04-05 ENCOUNTER — Telehealth: Payer: Self-pay | Admitting: Family Medicine

## 2017-04-05 NOTE — Telephone Encounter (Signed)
Symptoms: dry cough, blood mixed in yellow and green mucus, lost her voice, head cold  No fever Been sick 2 weeks, tried everything over the counter, nothing working Requesting a prescription to help her. Cb#: [phone]

## 2017-04-05 NOTE — Telephone Encounter (Signed)
She needs clinical evaluation and I am not available to see her before next weeek

## 2017-04-05 NOTE — Telephone Encounter (Signed)
I tried to call her back but her number was disconnected. Please advise

## 2017-04-06 NOTE — Telephone Encounter (Signed)
Offered appt with Med[facility]. Patient could not do that. Advised clinical eval at [facility] if needed

## 2017-04-10 ENCOUNTER — Ambulatory Visit: Payer: BLUE CROSS/BLUE SHIELD | Admitting: Neurology

## 2017-04-12 ENCOUNTER — Telehealth: Payer: Self-pay

## 2017-04-12 NOTE — Telephone Encounter (Signed)
Pt is sick with a cold and needs to come in.  I do not see anything.  Can you please call her.

## 2017-04-16 NOTE — Telephone Encounter (Signed)
Called - no answer, left message to call office.

## 2017-04-19 ENCOUNTER — Encounter: Payer: Self-pay | Admitting: Family Medicine

## 2017-04-19 ENCOUNTER — Ambulatory Visit (INDEPENDENT_AMBULATORY_CARE_PROVIDER_SITE_OTHER): Payer: BLUE CROSS/BLUE SHIELD | Admitting: Family Medicine

## 2017-04-19 ENCOUNTER — Ambulatory Visit: Payer: Self-pay | Admitting: Family Medicine

## 2017-04-19 VITALS — BP 124/82 | HR 85 | Resp 16 | Ht 60.0 in | Wt 163.1 lb

## 2017-04-19 DIAGNOSIS — I10 Essential (primary) hypertension: Secondary | ICD-10-CM | POA: Diagnosis not present

## 2017-04-19 DIAGNOSIS — N760 Acute vaginitis: Secondary | ICD-10-CM | POA: Diagnosis not present

## 2017-04-19 DIAGNOSIS — B9689 Other specified bacterial agents as the cause of diseases classified elsewhere: Secondary | ICD-10-CM

## 2017-04-19 DIAGNOSIS — E669 Obesity, unspecified: Secondary | ICD-10-CM

## 2017-04-19 DIAGNOSIS — J209 Acute bronchitis, unspecified: Secondary | ICD-10-CM

## 2017-04-19 DIAGNOSIS — M549 Dorsalgia, unspecified: Secondary | ICD-10-CM

## 2017-04-19 MED ORDER — AZITHROMYCIN 250 MG PO TABS: ORAL_TABLET | ORAL | 0 refills | Status: DC

## 2017-04-19 MED ORDER — METRONIDAZOLE 500 MG PO TABS: 500.0000 mg | ORAL_TABLET | Freq: Two times a day (BID) | ORAL | 1 refills | Status: DC

## 2017-04-19 MED ORDER — BENZONATATE 100 MG PO CAPS: 100.0000 mg | ORAL_CAPSULE | Freq: Two times a day (BID) | ORAL | 0 refills | Status: DC | PRN

## 2017-04-19 MED ORDER — PROMETHAZINE-DM 6.25-15 MG/5ML PO SYRP: ORAL_SOLUTION | ORAL | 0 refills | Status: DC

## 2017-04-19 MED ORDER — FLUCONAZOLE 150 MG PO TABS: ORAL_TABLET | ORAL | 1 refills | Status: DC

## 2017-04-19 NOTE — Progress Notes (Signed)
   Rebecca Boyd     MRN: [id number]      DOB: Aug 07, 1975   HPI Rebecca Boyd is here with a 3 week h/o cough and chest congestion, yellow mucus and hoarseness with sore throat, no documented fever, sometimes chills, no ear pain  c/o recureent or daily bV , wants help!  ROS  Denies sinus pressure, nasal congestion, ear pain or sore throat.  Denies chest pains, palpitations and leg swelling Denies abdominal pain, nausea, vomiting,diarrhea or constipation.   Denies dysuria, frequency, hesitancy or incontinence. Denies uncontrolled  joint pain, swelling and limitation in mobility. Headaches have improved significantly Denies depression, anxiety or insomnia. Denies skin break down or rash.   PE  BP 124/82   Pulse 85   Resp 16   Ht 5' (1.524 m)   Wt 163 lb 1.9 oz (74 kg)   SpO2 98%   BMI 31.86 kg/m   Patient alert and oriented and in no cardiopulmonary distress.  HEENT: No facial asymmetry, EOMI,   oropharynx pink and moist.  Neck supple no JVD, no mass.  Chest: Adequate air entry, scattered crackles, no wheezes  CVS: S1, S2 no murmurs, no S3.Regular rate.  ABD: Soft non tender.   Ext: No edema  MS: Adequate ROM spine, shoulders, hips and knees.  Skin: Intact, no ulcerations or rash noted.  Psych: Good eye contact, normal affect. Memory intact not anxious or depressed appearing.  CNS: CN 2-12 intact, power,  normal throughout.no focal deficits noted.   Assessment & Plan  Acute bronchitis Z pack and decongestant and cough suppressant prescribed  Bacterial vaginosis Multiple recurrences, will use twice daily for 2 days during recurrences   Essential hypertension, benign Controlled, no change in medication DASH diet and commitment to daily physical activity for a minimum of 30 minutes discussed and encouraged, as a part of hypertension management. The importance of attaining a healthy weight is also discussed.  BP/Weight 04/19/2017 02/15/2017 01/04/2017 01/04/2017  12/06/2016 12/04/2016 0/10/6224  Systolic BP 333 545 625 638 937 342 876  Diastolic BP 82 82 90 80 811 82 84  Wt. (Lbs) 163.12 158.25 160.5 160 159.5 157.12 158  BMI 31.86 30.91 26.71 31.25 31.15 30.69 30.86  Some encounter information is confidential and restricted. Go to Review Flowsheets activity to see all data.       Mildly obese Deteriorated. Patient re-educated about  the importance of commitment to a  minimum of 150 minutes of exercise per week.  The importance of healthy food choices with portion control discussed. Encouraged to start a food diary, count calories and to consider  joining a support group. Sample diet sheets offered. Goals set by the patient for the next several months.   Weight /BMI 04/19/2017 02/15/2017 01/04/2017  WEIGHT 163 lb 1.9 oz 158 lb 4 oz 160 lb 8 oz  HEIGHT 5\' 0"  5\' 0"  5\' 5"   BMI 31.86 kg/m2 30.91 kg/m2 26.71 kg/m2  Some encounter information is confidential and restricted. Go to Review Flowsheets activity to see all data.      Back pain with radiation Controlled, no change in medication

## 2017-04-19 NOTE — Patient Instructions (Addendum)
Physical exam as before,  Call if you need me sooner  You are treated with  Z pack, Tessalon perles, and phenergan DM for bronchitis,   Note for appointment today, and back to work today  For hoarseness, salt water , gargles, honey and lime 3 times daily   Metronidazole  tablets sent in for recurrent BV, use for 2 days with a flare , one twice daily  Happy holidays!

## 2017-04-23 ENCOUNTER — Encounter: Payer: Self-pay | Admitting: Family Medicine

## 2017-04-23 NOTE — Assessment & Plan Note (Signed)
Deteriorated. Patient re-educated about  the importance of commitment to a  minimum of 150 minutes of exercise per week.  The importance of healthy food choices with portion control discussed. Encouraged to start a food diary, count calories and to consider  joining a support group. Sample diet sheets offered. Goals set by the patient for the next several months.   Weight /BMI 04/19/2017 02/15/2017 01/04/2017  WEIGHT 163 lb 1.9 oz 158 lb 4 oz 160 lb 8 oz  HEIGHT 5\' 0"  5\' 0"  5\' 5"   BMI 31.86 kg/m2 30.91 kg/m2 26.71 kg/m2  Some encounter information is confidential and restricted. Go to Review Flowsheets activity to see all data.

## 2017-04-23 NOTE — Assessment & Plan Note (Signed)
Multiple recurrences, will use twice daily for 2 days during recurrences

## 2017-04-23 NOTE — Assessment & Plan Note (Signed)
Z pack and decongestant and cough suppressant prescribed

## 2017-04-23 NOTE — Assessment & Plan Note (Signed)
Controlled, no change in medication DASH diet and commitment to daily physical activity for a minimum of 30 minutes discussed and encouraged, as a part of hypertension management. The importance of attaining a healthy weight is also discussed.  BP/Weight 04/19/2017 02/15/2017 01/04/2017 01/04/2017 12/06/2016 12/04/2016 05/15/7260  Systolic BP 035 597 416 384 536 468 032  Diastolic BP 82 82 90 80 122 82 84  Wt. (Lbs) 163.12 158.25 160.5 160 159.5 157.12 158  BMI 31.86 30.91 26.71 31.25 31.15 30.69 30.86  Some encounter information is confidential and restricted. Go to Review Flowsheets activity to see all data.

## 2017-04-23 NOTE — Assessment & Plan Note (Signed)
Controlled, no change in medication  

## 2017-05-14 ENCOUNTER — Encounter: Payer: Self-pay | Admitting: Family Medicine

## 2017-05-14 ENCOUNTER — Other Ambulatory Visit (HOSPITAL_COMMUNITY)
Admission: RE | Admit: 2017-05-14 | Discharge: 2017-05-14 | Disposition: A | Discharge status: Home / Self Care | Payer: BLUE CROSS/BLUE SHIELD | Source: Ambulatory Visit | Attending: Family Medicine | Admitting: Family Medicine

## 2017-05-14 ENCOUNTER — Ambulatory Visit (INDEPENDENT_AMBULATORY_CARE_PROVIDER_SITE_OTHER): Payer: BLUE CROSS/BLUE SHIELD | Admitting: Family Medicine

## 2017-05-14 VITALS — BP 120/82 | HR 94 | Resp 16 | Ht 60.0 in | Wt 163.0 lb

## 2017-05-14 DIAGNOSIS — R21 Rash and other nonspecific skin eruption: Secondary | ICD-10-CM

## 2017-05-14 DIAGNOSIS — G8929 Other chronic pain: Secondary | ICD-10-CM | POA: Diagnosis not present

## 2017-05-14 DIAGNOSIS — Z0000 Encounter for general adult medical examination without abnormal findings: Secondary | ICD-10-CM

## 2017-05-14 DIAGNOSIS — E669 Obesity, unspecified: Secondary | ICD-10-CM

## 2017-05-14 DIAGNOSIS — N760 Acute vaginitis: Secondary | ICD-10-CM | POA: Insufficient documentation

## 2017-05-14 DIAGNOSIS — Z1211 Encounter for screening for malignant neoplasm of colon: Secondary | ICD-10-CM | POA: Diagnosis not present

## 2017-05-14 HISTORY — DX: Rash and other nonspecific skin eruption: R21

## 2017-05-14 LAB — POC HEMOCCULT BLD/STL (OFFICE/1-CARD/DIAGNOSTIC): FECAL OCCULT BLD: NEGATIVE

## 2017-05-14 MED ORDER — OXYCODONE-ACETAMINOPHEN 5-325 MG PO TABS: ORAL_TABLET | ORAL | 0 refills | Status: DC

## 2017-05-14 MED ORDER — ALPRAZOLAM 1 MG PO TABS: 1.0000 mg | ORAL_TABLET | Freq: Every day | ORAL | 5 refills | Status: DC

## 2017-05-14 MED ORDER — OXYCODONE-ACETAMINOPHEN 5-325 MG PO TABS: 1.0000 | ORAL_TABLET | Freq: Every day | ORAL | 0 refills | Status: DC

## 2017-05-14 NOTE — Progress Notes (Signed)
Rebecca Boyd     MRN: [id number]      DOB: 1975-08-27  HPI: Patient is in for annual physical exam. C/o recurrent rash on trunk and extremities for past 8 to 12 months wants / needs derm eval , has had in the past Chronic pain management is reviewed and medication managed also anxiety management C/o fishy vaginal d/c and wants STD testing for "everything" Weight management is discussed and addressed , the ned to change diet and lose weight is prioritized , she is now obese, and c/o new numbness and tingling down her left thigh Immunization is reviewed , and  She refuses the flu vaccine  PE: BP 120/82   Pulse 94   Resp 16   Ht 5' (1.524 m)   Wt 163 lb (73.9 kg)   SpO2 97%   BMI 31.83 kg/m   Pleasant  female, alert and oriented x 3, in no cardio-pulmonary distress. Afebrile. HEENT No facial trauma or asymetry. Sinuses non tender.  Extra occullar muscles intact,  External ears normal, tympanic membranes clear. Oropharynx moist, no exudate. Neck: supple, no adenopathy,JVD or thyromegaly.No bruits.  Chest: Clear to ascultation bilaterally.No crackles or wheezes. Non tender to palpation  Breast: No asymetry,no masses or lumps. No tenderness. No nipple discharge or inversion. No axillary or supraclavicular adenopathy  Cardiovascular system; Heart sounds normal,  S1 and  S2 ,no S3.  No murmur, or thrill. Apical beat not displaced Peripheral pulses normal.  Abdomen: Soft, non tender, no organomegaly or masses. No bruits. Bowel sounds normal. No guarding, tenderness or rebound.  Rectal:  Normal sphincter tone. No rectal mass. Guaiac negative stool.  GU: External genitalia normal female genitalia , normal female distribution of hair. No lesions. Urethral meatus normal in size, no  Prolapse, no lesions visibly  Present. Bladder non tender. Vagina pink and moist , with no visible lesions , discharge present . Adequate pelvic support no  cystocele or rectocele  noted Cervix pink and appears healthy, no lesions or ulcerations noted, fishy discharge noted from os Uterus normal size, no adnexal masses, no cervical motion or adnexal tenderness.   Musculoskeletal exam: Full ROM of spine, hips , shoulders and knees. No deformity ,swelling or crepitus noted. No muscle wasting or atrophy.   Neurologic: Cranial nerves 2 to 12 intact. Power, tone ,sensation and reflexes normal throughout. No disturbance in gait. No tremor.  Skin: Hyperpigmented skin lesions also open erythematous vesicular lesions some of which are open are present scattered on upper and lower chest and abdomen , also on upper more so than lower extremities No purulent drainage noted Psych; Normal mood and affect. Judgement and concentration normal   Assessment & Plan:  Annual physical exam Annual exam as documented. Counseling done  re healthy lifestyle involving commitment to 150 minutes exercise per week, heart healthy diet, and attaining healthy weight.The importance of adequate sleep also discussed. Regular seat belt use and home safety, is also discussed. Changes in health habits are decided on by the patient with goals and time frames  set for achieving them. Immunization and cancer screening needs are specifically addressed at this visit.   Vaginitis and vulvovaginitis specimens sent and will treat based on result  Mildly obese Unchanged Patient re-educated about  the importance of commitment to a  minimum of 150 minutes of exercise per week.  The importance of healthy food choices with portion control discussed. Encouraged to start a food diary, count calories and to consider  joining a  support group. Sample diet sheets offered. Goals set by the patient for the next several months.   Weight /BMI 05/14/2017 04/19/2017 02/15/2017  WEIGHT 163 lb 163 lb 1.9 oz 158 lb 4 oz  HEIGHT 5\' 0"  5\' 0"  5\' 0"   BMI 31.83 kg/m2 31.86 kg/m2 30.91 kg/m2  Some encounter information  is confidential and restricted. Go to Review Flowsheets activity to see all data.      Rash and nonspecific skin eruption 3 week h/o rash on chest, upper back and arm  Sometimes on lower extremities, intermittent,  Blisters, has been treated in the past at [facility], needs re evaluation  Encounter for chronic pain management The patient's Controlled Substance registry is reviewed and compliance confirmed. Adequacy of  Pain control and level of function is assessed. Medication dosing is adjusted as deemed appropriate. Twelve weeks of medication is prescribed , patient signs for the script and is provided with a follow up appointment between 11 to 12 weeks .

## 2017-05-14 NOTE — Progress Notes (Signed)
04/25/2017  1   02/22/2017  Oxycodone-Acetaminophen 5-325  30 30 Ma Sim  [id number] Car ([id number])  0 7.50 MME  Comm Ins  [place]  04/25/2017  1   03/26/2017  Alprazolam 1 MG Tablet  30 30 Ma Sim  [id number] Car ([id number])  1 2.00 LME  Comm Ins  [place]  03/27/2017  1   02/15/2017  Oxycodone-Acetaminophen 5-325  30 30 Ma Sim  [id number] Car ([id number])  0 7.50 MME  Comm Ins  [place]  03/26/2017  1   03/26/2017  Alprazolam 1 MG Tablet  30 30 Ma Sim  [id number] Car ([id number])  0 2.00 LME  Comm Ins  [place]  02/24/2017  1   02/15/2017  Oxycodone-Acetaminophen 5-325  30 30 Ma Sim  [id number] Car ([id number])  0 7.50 MME  Comm Ins  [place]  02/22/2017  1   11/14/2016  Alprazolam 1 MG Tablet  30 30 Ma Sim  [id number] Car ([id number])  2 2.00 LME  Comm Ins  [place]  01/25/2017  1   11/24/2016  Oxycodone-Acetaminophen 5-325  30 30 Ma

## 2017-05-14 NOTE — Assessment & Plan Note (Signed)
The patient's Controlled Substance registry is reviewed and compliance confirmed. Adequacy of  Pain control and level of function is assessed. Medication dosing is adjusted as deemed appropriate. Twelve weeks of medication is prescribed , patient signs for the script and is provided with a follow up appointment between 11 to 12 weeks .  

## 2017-05-14 NOTE — Assessment & Plan Note (Addendum)
Unchanged Patient re-educated about  the importance of commitment to a  minimum of 150 minutes of exercise per week.  The importance of healthy food choices with portion control discussed. Encouraged to start a food diary, count calories and to consider  joining a support group. Sample diet sheets offered. Goals set by the patient for the next several months.   Weight /BMI 05/14/2017 04/19/2017 02/15/2017  WEIGHT 163 lb 163 lb 1.9 oz 158 lb 4 oz  HEIGHT 5\' 0"  5\' 0"  5\' 0"   BMI 31.83 kg/m2 31.86 kg/m2 30.91 kg/m2  Some encounter information is confidential and restricted. Go to Review Flowsheets activity to see all data.

## 2017-05-14 NOTE — Assessment & Plan Note (Signed)
3 week h/o rash on chest, upper back and arm  Sometimes on lower extremities, intermittent,  Blisters, has been treated in the past at [facility], needs re evaluation

## 2017-05-14 NOTE — Assessment & Plan Note (Signed)

## 2017-05-14 NOTE — Assessment & Plan Note (Signed)
specimens sent and will treat based on result

## 2017-05-14 NOTE — Patient Instructions (Addendum)
F/u week of March 11, call if you need me sooner  Fasting lipid, chem 7 and vit D this week Change diet so you lose 5 pounds  You are referred ot dermatology at [facility] re rash on trunk and arms that itches for past 4 weeks  Specimens today for STD testimg  It is important that you exercise regularly at least 30 minutes 5 times a week. If you develop chest pain, have severe difficulty breathing, or feel very tired, stop exercising immediately and seek medical attention    Weight loss will help chronic pain and improve health  Thank you  for choosing  Primary Care. We consider it a privelige to serve you.  Delivering excellent health care in a caring and  compassionate way is our goal.  Partnering with you,  so that together we can achieve this goal is our strategy.

## 2017-05-16 LAB — CERVICOVAGINAL ANCILLARY ONLY
CHLAMYDIA, DNA PROBE: NEGATIVE
NEISSERIA GONORRHEA: NEGATIVE
Wet Prep (BD Affirm): POSITIVE — AB

## 2017-05-18 ENCOUNTER — Other Ambulatory Visit: Payer: Self-pay

## 2017-05-18 MED ORDER — METRONIDAZOLE 500 MG PO TABS: 500.0000 mg | ORAL_TABLET | Freq: Two times a day (BID) | ORAL | 0 refills | Status: DC

## 2017-06-04 ENCOUNTER — Encounter (INDEPENDENT_AMBULATORY_CARE_PROVIDER_SITE_OTHER): Payer: Self-pay

## 2017-06-04 ENCOUNTER — Ambulatory Visit (INDEPENDENT_AMBULATORY_CARE_PROVIDER_SITE_OTHER): Payer: BLUE CROSS/BLUE SHIELD | Admitting: Neurology

## 2017-06-04 VITALS — BP 136/91 | HR 96 | Ht 60.0 in | Wt 167.2 lb

## 2017-06-04 DIAGNOSIS — IMO0002 Reserved for concepts with insufficient information to code with codable children: Secondary | ICD-10-CM

## 2017-06-04 DIAGNOSIS — G43709 Chronic migraine without aura, not intractable, without status migrainosus: Secondary | ICD-10-CM | POA: Diagnosis not present

## 2017-06-04 MED ORDER — TIZANIDINE HCL 4 MG PO TABS: 4.0000 mg | ORAL_TABLET | Freq: Four times a day (QID) | ORAL | 6 refills | Status: DC | PRN

## 2017-06-04 MED ORDER — TOPIRAMATE 100 MG PO TABS: 100.0000 mg | ORAL_TABLET | Freq: Two times a day (BID) | ORAL | 11 refills | Status: DC

## 2017-06-04 MED ORDER — SUMATRIPTAN SUCCINATE 6 MG/0.5ML ~~LOC~~ SOLN: 6.0000 mg | SUBCUTANEOUS | 11 refills | Status: DC | PRN

## 2017-06-04 MED ORDER — ONDANSETRON 4 MG PO TBDP: 4.0000 mg | ORAL_TABLET | Freq: Three times a day (TID) | ORAL | 6 refills | Status: DC | PRN

## 2017-06-04 MED ORDER — NORTRIPTYLINE HCL 10 MG PO CAPS: 20.0000 mg | ORAL_CAPSULE | Freq: Every day | ORAL | 11 refills | Status: DC

## 2017-06-04 MED ORDER — RIZATRIPTAN BENZOATE 10 MG PO TBDP: 10.0000 mg | ORAL_TABLET | ORAL | 11 refills | Status: DC | PRN

## 2017-06-04 NOTE — Progress Notes (Signed)
PATIENT: Rebecca Boyd DOB: [birth year]  Chief Complaint  Patient presents with  . Migraine    Reports having 1-2 migraines per month lasting 1-3 days. Last month she only had one migraine.  Rizatriptan and rest in a dark room works well for her pain.     HISTORICAL  Rebecca Boyd a 42 years old right-handed female, seen in refer by her primary care doctor Fayrene Helper, for evaluation of headache, initial evaluation was December 06 2016.  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  She reported a history of migraine headaches since 2007, used to be intermittent, couple times each months, but since June 2018, she reported increased headache, to almost daily bases, constant low-grade lateralized headache, frequent exacerbation to severe pounding headache with associated light noise sensitivity, nauseous, sometimes associated seeing flashlight prior to and during her severe migraine headache, her headache can last for couple days,  She was started on Topamax 100 mg twice a day about 2 months ago, with mild side effect, but no significant help, she also takes Imitrex 50 mg as needed, is helpful most of the time,  Trigger for her migraine headaches are weather change, bright light, strong smells,  UPDATE Sept 6 2018: She has tried imitrex 50mg  as needed few times over past one month, she throw up each time.  Today, she complains of 2 weeks of severe left parietal area sharp headaches,she has tried imitrex, phenergan did not help.   She is able to tolerate topamax 100mg  bid, and nortriptyline 10 mg 2 tablets every night,  UPDATE Feb 4th 2019: Her migraine has much improved, now she has it about twice a month, lasting for 2 days, she throw up maxalt dissolvable sometimes because of severe nausea during  migraine headaches,   REVIEW OF SYSTEMS: Full 14 system review of systems  performed and notable only for headache, numbness, blurred vision, fatigue  ALLERGIES: Allergies  Allergen Reactions  . Diphenhydramine Hcl Anaphylaxis  . Orange Fruit Hives, Shortness Of Breath, Itching and Other (See Comments)    MAKES IT DIFFICULT TO BREATH  . Latex Rash    HOME MEDICATIONS: Current Outpatient Medications  Medication Sig Dispense Refill  . albuterol (PROVENTIL HFA;VENTOLIN HFA) 108 (90 Base) MCG/ACT inhaler Inhale 2 puffs into the lungs every 6 (six) hours as needed for wheezing or shortness of breath. 1 Inhaler 1  . ALPRAZolam (XANAX) 1 MG tablet Take 1 tablet (1 mg total) by mouth at bedtime. 30 tablet 5  . amLODipine (NORVASC) 2.5 MG tablet TAKE 1 TABLET BY MOUTH ONCE DAILY 90 tablet 1  . fluticasone (FLONASE) 50 MCG/ACT nasal spray Place 2 sprays into both nostrils daily. (Patient taking differently: Place 2 sprays into both nostrils daily as needed for allergies. ) 16 g 1  . metroNIDAZOLE (FLAGYL) 500 MG tablet Take 1 tablet (500 mg total) by mouth 2 (two) times daily. 14 tablet 0  . nortriptyline (PAMELOR) 10 MG capsule Take 2 capsules (20 mg total) by mouth at bedtime. 60 capsule 11  . oxyCODONE-acetaminophen (PERCOCET) 5-325 MG tablet Take 1 tablet by mouth at bedtime 30 tablet 0  . oxyCODONE-acetaminophen (PERCOCET) 5-325 MG tablet Take 1 tablet by mouth at bedtime 30 tablet 0  . oxyCODONE-acetaminophen (PERCOCET/ROXICET) 5-325 MG tablet Take 1 tablet by mouth at bedtime. 30 tablet 0  . promethazine (PHENERGAN) 25 MG tablet Take 1 tablet (  25 mg total) by mouth every 6 (six) hours as needed for nausea or vomiting. 30 tablet 6  . promethazine-dextromethorphan (PROMETHAZINE-DM) 6.25-15 MG/5ML syrup One teaspoon at bedtime , as needed, for excess cough 240 mL 0  . rizatriptan (MAXALT-MLT) 10 MG disintegrating tablet Take 1 tablet (10 mg total) by mouth as needed. May repeat in 2 hours if needed 15 tablet 6  . topiramate (TOPAMAX) 100 MG tablet Take 1 tablet (100 mg  total) by mouth 2 (two) times daily. 60 tablet 11   No current facility-administered medications for this visit.     PAST MEDICAL HISTORY: Past Medical History:  Diagnosis Date  . Anemia   . Anxiety   . Depressive disorder, not elsewhere classified   . Hypertension   . Migraine   . Migraines   . Miscarriage 2009  . Neck pain   . Vaginitis    cyctitis    PAST SURGICAL HISTORY: Past Surgical History:  Procedure Laterality Date  . cervical cryotherapy N/A 1999  . DILITATION & CURRETTAGE/HYSTROSCOPY WITH THERMACHOICE ABLATION  08/26/2013   Procedure: DILATATION & CURETTAGE/HYSTEROSCOPY WITH THERMACHOICE ENDOMETRIAL ABLATION Procedure #2 Total Therapy Time=min       sec;  Surgeon: Jonnie Kind, MD;  Location: [facility];  Service: Gynecology;;  . LAPAROSCOPIC BILATERAL SALPINGECTOMY Bilateral 08/26/2013   Procedure: LAPAROSCOPIC BILATERAL SALPINGECTOMY AND REMOVAL OF LEFT PERITUBAL CYST Procedure #1;  Surgeon: Jonnie Kind, MD;  Location: [facility];  Service: Gynecology;  Laterality: Bilateral;  . LAPAROSCOPIC LYSIS OF ADHESIONS  08/26/2013   Procedure: LAPAROSCOPIC LYSIS OF ADHESIONS Procedure #1;  Surgeon: Jonnie Kind, MD;  Location: [facility];  Service: Gynecology;;  . Carmelia Bake LESION REMOVAL  08/26/2013   Procedure: REMOVAL OF VULVAR SEBACEOUS CYST Procedure #3;  Surgeon: Jonnie Kind, MD;  Location: [facility];  Service: Gynecology;;    FAMILY HISTORY: Family History  Problem Relation Age of Onset  . Drug abuse Mother   . Hypertension Father   . Hypertension Maternal Grandmother   . Hypertension Maternal Grandfather   . Hypertension Paternal Grandmother     SOCIAL HISTORY:  Social History   Socioeconomic History  . Marital status: Single    Spouse name: Not on file  . Number of children: Not on file  . Years of education: Not on file  . Highest education level: Not on file  Social Needs  . Financial resource strain: Not on file  . Food insecurity - worry: Not on file    . Food insecurity - inability: Not on file  . Transportation needs - medical: Not on file  . Transportation needs - non-medical: Not on file  Occupational History  . Not on file  Tobacco Use  . Smoking status: Former Smoker    Packs/day: 0.25    Types: Cigars    Last attempt to quit: 01/06/2016    Years since quitting: 1.4  . Smokeless tobacco: Never Used  Substance and Sexual Activity  . Alcohol use: Yes    Comment: occasional  . Drug use: No  . Sexual activity: Yes    Birth control/protection: None  Other Topics Concern  . Not on file  Social History Narrative   Lives home with mother.  Works at [employer].  Education 10th grade/GED.  No children.  Single.       PHYSICAL EXAM   Vitals:   06/04/17 1637  Weight: 167 lb 4 oz (75.9 kg)  Height: 5' (1.524 m)    Not  recorded      Body mass index is 32.66 kg/m.  PHYSICAL EXAMNIATION:  Gen: NAD, conversant, well nourised, obese, well groomed                     Cardiovascular: Regular rate rhythm, no peripheral edema, warm, nontender. Eyes: Conjunctivae clear without exudates or hemorrhage Neck: Supple, no carotid bruits. Pulmonary: Clear to auscultation bilaterally   NEUROLOGICAL EXAM:  MENTAL STATUS: Speech:    Speech is normal; fluent and spontaneous with normal comprehension.  Cognition:     Orientation to time, place and person     Normal recent and remote memory     Normal Attention span and concentration     Normal Language, naming, repeating,spontaneous speech     Fund of knowledge   CRANIAL NERVES: CN II: Visual fields are full to confrontation. Fundoscopic exam is normal with sharp discs and no vascular changes. Pupils are round equal and briskly reactive to light. CN III, IV, VI: extraocular movement are normal. No ptosis. CN V: Facial sensation is intact to pinprick in all 3 divisions bilaterally. Corneal responses are intact.  CN VII: Face is symmetric with normal eye closure and  smile. CN VIII: Hearing is normal to rubbing fingers CN IX, X: Palate elevates symmetrically. Phonation is normal. CN XI: Head turning and shoulder shrug are intact CN XII: Tongue is midline with normal movements and no atrophy.  MOTOR: There is no pronator drift of out-stretched arms. Muscle bulk and tone are normal. Muscle strength is normal.  REFLEXES: Reflexes are 2+ and symmetric at the biceps, triceps, knees, and ankles. Plantar responses are flexor.  SENSORY: Intact to light touch, pinprick, positional sensation and vibratory sensation are intact in fingers and toes.  COORDINATION: Rapid alternating movements and fine finger movements are intact. There is no dysmetria on finger-to-nose and heel-knee-shin.    GAIT/STANCE: Posture is normal. Gait is steady with normal steps, base, arm swing, and turning. Heel and toe walking are normal. Tandem gait is normal.  Romberg is absent.   DIAGNOSTIC DATA (LABS, IMAGING, TESTING) - I reviewed patient records, labs, notes, testing and imaging myself where available.   ASSESSMENT AND PLAN  DEMMI SINDT is a 42 y.o. female   Chronic migraine headache  Continue preventive medications Topamax 100 mg twice a day  nortriptyline 10 mg titrating to 20 mg every night  Imitrex SQ injection, may mix together with tizanidine, Aleve as needed      [name], M.D. Ph.D.  [facility] [address], [place], [place] [address] Ph: [phone] Fax: [phone]  [id number], Norwood Levo, MD

## 2017-06-07 ENCOUNTER — Encounter: Payer: Self-pay | Admitting: Neurology

## 2017-06-28 ENCOUNTER — Other Ambulatory Visit: Payer: Self-pay | Admitting: Family Medicine

## 2017-06-29 ENCOUNTER — Telehealth: Payer: Self-pay

## 2017-06-29 NOTE — Telephone Encounter (Signed)
Pt called and LMOM on front desk voice mail asking had xanax been filled, I called her back and told her the message is pending the Physician approval

## 2017-07-02 ENCOUNTER — Other Ambulatory Visit: Payer: Self-pay | Admitting: Family Medicine

## 2017-07-02 DIAGNOSIS — I10 Essential (primary) hypertension: Secondary | ICD-10-CM

## 2017-07-12 ENCOUNTER — Telehealth: Payer: Self-pay | Admitting: Family Medicine

## 2017-07-12 NOTE — Telephone Encounter (Signed)
Patient called in to request xanex last week and she says they were never called in. Please advise.  She states she is way behind schedule on her medication. [facility]acy: [facility] Cb#: [phone]

## 2017-07-12 NOTE — Telephone Encounter (Signed)
Med called in

## 2017-08-08 ENCOUNTER — Ambulatory Visit (INDEPENDENT_AMBULATORY_CARE_PROVIDER_SITE_OTHER): Payer: BLUE CROSS/BLUE SHIELD | Admitting: Family Medicine

## 2017-08-08 ENCOUNTER — Encounter: Payer: Self-pay | Admitting: Family Medicine

## 2017-08-08 ENCOUNTER — Other Ambulatory Visit: Payer: Self-pay

## 2017-08-08 ENCOUNTER — Other Ambulatory Visit (HOSPITAL_COMMUNITY)
Admission: RE | Admit: 2017-08-08 | Discharge: 2017-08-08 | Disposition: A | Discharge status: Home / Self Care | Payer: BLUE CROSS/BLUE SHIELD | Source: Ambulatory Visit | Attending: Family Medicine | Admitting: Family Medicine

## 2017-08-08 VITALS — BP 112/80 | HR 89 | Temp 98.7°F | Resp 24 | Ht 60.0 in | Wt 169.1 lb

## 2017-08-08 DIAGNOSIS — B9689 Other specified bacterial agents as the cause of diseases classified elsewhere: Secondary | ICD-10-CM | POA: Insufficient documentation

## 2017-08-08 DIAGNOSIS — N730 Acute parametritis and pelvic cellulitis: Secondary | ICD-10-CM | POA: Insufficient documentation

## 2017-08-08 DIAGNOSIS — R350 Frequency of micturition: Secondary | ICD-10-CM

## 2017-08-08 DIAGNOSIS — N739 Female pelvic inflammatory disease, unspecified: Secondary | ICD-10-CM

## 2017-08-08 DIAGNOSIS — R1013 Epigastric pain: Secondary | ICD-10-CM | POA: Diagnosis not present

## 2017-08-08 LAB — POCT URINALYSIS DIPSTICK
BILIRUBIN UA: NEGATIVE
Glucose, UA: NEGATIVE
KETONES UA: NEGATIVE
Leukocytes, UA: NEGATIVE
Nitrite, UA: NEGATIVE
Protein, UA: NEGATIVE
Spec Grav, UA: 1.025 (ref 1.010–1.025)
Urobilinogen, UA: 0.2 E.U./dL
pH, UA: 5.5 (ref 5.0–8.0)

## 2017-08-08 LAB — POCT URINE PREGNANCY: Preg Test, Ur: NEGATIVE

## 2017-08-08 MED ORDER — DOXYCYCLINE HYCLATE 100 MG PO TABS: 100.0000 mg | ORAL_TABLET | Freq: Two times a day (BID) | ORAL | 0 refills | Status: DC

## 2017-08-08 MED ORDER — CEFTRIAXONE SODIUM 250 MG IJ SOLR: 250.0000 mg | Freq: Once | INTRAMUSCULAR | Status: DC

## 2017-08-08 NOTE — Progress Notes (Signed)
Chief Complaint  Patient presents with  . Abdominal Pain    x 1 week  Usual PCP is Tula Nakayama, MD Patient is here for abdominal pain, seen for an acute visit Patient reports 2 things going on.  One she has upper abdominal and epigastric pain.  No change in appetite.  No change with food.  No nausea vomiting diarrhea.  Normal bowel movement this morning.  It hurts with certain movements.  No trauma.  No history of heartburn.  No belching or acid reflux symptoms. The second is lower abdominal pain.  It hurts all across her lower abdomen.  This is a crampy pain that comes and goes.  She has some urinary frequency.  No dysuria, no hematuria.  She has normal menstrual periods she has unprotected sex.  She does not think she is pregnant but cannot be certain.  Denies any known exposure to STD.  Denies any vaginal discharge or discomfort.  No fever or chills.  Patient Active Problem List   Diagnosis Date Noted  . Rash and nonspecific skin eruption 05/14/2017  . Chronic migraine 12/06/2016  . Encounter for chronic pain management 05/10/2016  . Headache 03/16/2016  . Annual physical exam 11/11/2015  . Mildly obese 03/17/2015  . Back pain with radiation 08/30/2014  . Bacterial vaginosis 07/27/2014  . Essential hypertension, benign 01/07/2014  . Cervical neck pain with evidence of disc disease 10/02/2013  . 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 Asthma, mild intermittent 11/14/2012  . Vitamin D deficiency 10/27/2010  . Anxiety 10/26/2010  . Vaginitis and vulvovaginitis 02/12/2008    Outpatient Encounter Medications as of 08/08/2017  Medication Sig  . albuterol (PROVENTIL HFA;VENTOLIN HFA) 108 (90 Base) MCG/ACT inhaler Inhale 2 puffs into the lungs every 6 (six) hours as needed for wheezing or shortness of breath.  . ALPRAZolam (XANAX) 1 MG tablet TAKE 1 TABLET BY MOUTH AT BEDTIME AS NEEDED FOR ANXIETY.  Marland Kitchen amLODipine (NORVASC) 2.5 MG tablet TAKE 1 TABLET BY MOUTH ONCE DAILY  . fluticasone  (FLONASE) 50 MCG/ACT nasal spray Place 2 sprays into both nostrils daily. (Patient taking differently: Place 2 sprays into both nostrils daily as needed for allergies. )  . nortriptyline (PAMELOR) 10 MG capsule Take 2 capsules (20 mg total) by mouth at bedtime.  . rizatriptan (MAXALT-MLT) 10 MG disintegrating tablet Take 1 tablet (10 mg total) by mouth as needed. May repeat in 2 hours if needed  . SUMAtriptan (IMITREX) 6 MG/0.5ML SOLN injection Inject 0.5 mLs (6 mg total) into the skin every 2 (two) hours as needed for migraine or headache. May repeat in 2 hours if headache persists or recurs.  Marland Kitchen tiZANidine (ZANAFLEX) 4 MG tablet Take 1 tablet (4 mg total) by mouth every 6 (six) hours as needed for muscle spasms.  Marland Kitchen topiramate (TOPAMAX) 100 MG tablet Take 1 tablet (100 mg total) by mouth 2 (two) times daily.  Marland Kitchen doxycycline (VIBRA-TABS) 100 MG tablet Take 1 tablet (100 mg total) by mouth 2 (two) times daily.   Facility-Administered Encounter Medications as of 08/08/2017  Medication  . cefTRIAXone (ROCEPHIN) injection 250 mg    Allergies  Allergen Reactions  . Diphenhydramine Hcl Anaphylaxis  . Orange Fruit Hives, Shortness Of Breath, Itching and Other (See Comments)    MAKES IT DIFFICULT TO BREATH  . Latex Rash    Review of Systems  Constitutional: Negative for activity change, appetite change and unexpected weight change.  HENT: Negative for congestion, dental problem, postnasal drip and rhinorrhea.  Eyes: Negative for redness and visual disturbance.  Respiratory: Negative for cough and shortness of breath.   Cardiovascular: Negative for chest pain, palpitations and leg swelling.  Gastrointestinal: Positive for abdominal distention and abdominal pain. Negative for constipation, diarrhea, nausea and vomiting.  Genitourinary: Positive for frequency and pelvic pain. Negative for difficulty urinating, flank pain, hematuria, menstrual problem, vaginal bleeding and vaginal discharge.    Musculoskeletal: Negative for arthralgias and back pain.  Neurological: Negative for dizziness and headaches.  Psychiatric/Behavioral: Negative for dysphoric mood and sleep disturbance. The patient is not nervous/anxious.     Physical Exam  Constitutional: She is oriented to person, place, and time. She appears well-developed and well-nourished.  HENT:  Head: Normocephalic and atraumatic.  Right Ear: External ear normal.  Left Ear: External ear normal.  Mouth/Throat: Oropharynx is clear and moist.  Eyes: Pupils are equal, round, and reactive to light. Conjunctivae are normal.  Neck: Normal range of motion. Neck supple. No thyromegaly present.  Cardiovascular: Normal rate, regular rhythm and normal heart sounds.  Pulmonary/Chest: Effort normal and breath sounds normal. No respiratory distress.  Abdominal: Soft. Bowel sounds are normal. There is no hepatosplenomegaly. There is tenderness in the suprapubic area. There is no rebound, no guarding and no CVA tenderness.  Genitourinary: Vagina normal. Uterus is tender. Uterus is not enlarged. Cervix exhibits motion tenderness and friability. Cervix exhibits no discharge. Right adnexum displays tenderness. Left adnexum displays tenderness.  Musculoskeletal: Normal range of motion. She exhibits no edema.  Lymphadenopathy:    She has no cervical adenopathy.  Neurological: She is alert and oriented to person, place, and time.  Gait normal  Skin: Skin is warm and dry.  Psychiatric: She has a normal mood and affect. Her behavior is normal. Thought content normal.  Nursing note and vitals reviewed.   BP 112/80   Pulse 89   Temp 98.7 F (37.1 C) (Oral)   Resp (!) 24   Ht 5' (1.524 m)   Wt 169 lb 1.3 oz (76.7 kg)   SpO2 98%   BMI 33.02 kg/m     ASSESSMENT/PLAN:  1. Urinary frequency UA does not reveal any suspicion of UTI - POCT Urinalysis Dipstick - POCT urine pregnancy  2. Epigastric pain Mild epigastric tenderness to superficial  palpation.  No tenderness to deep palpation.  Pain is reproduced with a sit up maneuver, appears muscular  3. PID (acute pelvic inflammatory disease) Cervical motion tenderness.  Mild cervical friability.  Culture done - cefTRIAXone (ROCEPHIN) injection 250 mg - Cervicovaginal ancillary only  Greater than 50% of this visit was spent in counseling and coordinating care.  Total face to face time:   40 minutes discussing PID, sexually transmitted diseases, etiology, treatment, follow-up, so reviewed available birth control if patient does not desire pregnancy.   Patient Instructions  You need to take the doxycycline 2x a day for 10 days Call for problems  I did a culture I will send you a letter with your test results.  If there is anything of concern, we will call right away.     Raylene Everts, MD

## 2017-08-08 NOTE — Patient Instructions (Addendum)
You need to take the doxycycline 2x a day for 10 days Call for problems  I did a culture I will send you a letter with your test results.  If there is anything of concern, we will call right away.

## 2017-08-09 LAB — CERVICOVAGINAL ANCILLARY ONLY
BACTERIAL VAGINITIS: POSITIVE — AB
CHLAMYDIA, DNA PROBE: NEGATIVE
Candida vaginitis: NEGATIVE
NEISSERIA GONORRHEA: NEGATIVE

## 2017-08-10 ENCOUNTER — Other Ambulatory Visit: Payer: Self-pay

## 2017-08-10 MED ORDER — METRONIDAZOLE 500 MG PO TABS: 500.0000 mg | ORAL_TABLET | Freq: Two times a day (BID) | ORAL | 0 refills | Status: DC

## 2017-08-10 NOTE — Progress Notes (Signed)
Left generic message asking for return call

## 2017-08-10 NOTE — Progress Notes (Signed)
Patient informed of results with verbal understanding. Will send prescription  into [place].

## 2017-08-23 ENCOUNTER — Other Ambulatory Visit: Payer: Self-pay

## 2017-08-23 ENCOUNTER — Encounter: Payer: Self-pay | Admitting: Family Medicine

## 2017-08-23 ENCOUNTER — Ambulatory Visit (INDEPENDENT_AMBULATORY_CARE_PROVIDER_SITE_OTHER): Payer: BLUE CROSS/BLUE SHIELD | Admitting: Family Medicine

## 2017-08-23 VITALS — BP 114/86 | HR 95 | Ht 60.0 in | Wt 170.0 lb

## 2017-08-23 DIAGNOSIS — M545 Low back pain, unspecified: Secondary | ICD-10-CM

## 2017-08-23 DIAGNOSIS — E669 Obesity, unspecified: Secondary | ICD-10-CM | POA: Diagnosis not present

## 2017-08-23 DIAGNOSIS — I10 Essential (primary) hypertension: Secondary | ICD-10-CM | POA: Diagnosis not present

## 2017-08-23 DIAGNOSIS — M5440 Lumbago with sciatica, unspecified side: Secondary | ICD-10-CM | POA: Diagnosis not present

## 2017-08-23 DIAGNOSIS — Z1231 Encounter for screening mammogram for malignant neoplasm of breast: Secondary | ICD-10-CM | POA: Diagnosis not present

## 2017-08-23 DIAGNOSIS — F419 Anxiety disorder, unspecified: Secondary | ICD-10-CM

## 2017-08-23 DIAGNOSIS — G8929 Other chronic pain: Secondary | ICD-10-CM | POA: Diagnosis not present

## 2017-08-23 MED ORDER — KETOROLAC TROMETHAMINE 60 MG/2ML IM SOLN
60.0000 mg | Freq: Once | INTRAMUSCULAR | Status: AC
  Administered 2017-08-23: 60 mg via INTRAMUSCULAR

## 2017-08-23 MED ORDER — CYCLOBENZAPRINE HCL 10 MG PO TABS: ORAL_TABLET | ORAL | 0 refills | Status: DC

## 2017-08-23 MED ORDER — METHYLPREDNISOLONE ACETATE 80 MG/ML IJ SUSP
80.0000 mg | Freq: Once | INTRAMUSCULAR | Status: AC
Start: 2017-08-23 — End: 2017-08-23
  Administered 2017-08-23: 80 mg via INTRAMUSCULAR

## 2017-08-23 MED ORDER — PREDNISONE 5 MG (21) PO TBPK: 5.0000 mg | ORAL_TABLET | ORAL | 0 refills | Status: DC

## 2017-08-23 NOTE — Assessment & Plan Note (Signed)
Uncontrolled.Toradol and depo medrol administered IM in the office , to be followed by a short course of oral prednisone and NSAIDS.  

## 2017-08-23 NOTE — Progress Notes (Signed)
Rebecca Boyd     MRN: [id number]      DOB: [birth year]   HPI [name] is here for follow up and re-evaluation of chronic medical conditions,chronic pain  management and review of any available recent lab and radiology data.  Preventive health is updated, specifically  Cancer screening and Immunization.   Pt also here for chronic pain management , her narcotic registry is reviewed  The PT denies any adverse reactions to current medications since the last visit.  2 week h/o right pelvic pain constant, sometimes sharper, denies vaginal discharge, no dyspareunia  ROS Denies recent fever or chills. Denies sinus pressure, nasal congestion, ear pain or sore throat. Denies chest congestion, productive cough or wheezing. Denies chest pains, palpitations and leg swelling Denies dysuria, frequency, hesitancy or incontinence. Denies uncontrolled  joint pain, swelling and limitation in mobility. Denies headaches, seizures, numbness, or tingling. Denies depression,  Uncontrolled anxiety or insomnia. Denies skin break down or rash.   PE  BP 114/86 (BP Location: Left Arm, Patient Position: Sitting, Cuff Size: Large)   Pulse 95   Ht 5' (1.524 m)   Wt 170 lb (77.1 kg)   SpO2 99%   BMI 33.20 kg/m   Patient alert and oriented and in no cardiopulmonary distress.  HEENT: No facial asymmetry, EOMI,   oropharynx pink and moist.  Neck supple no JVD, no mass.  Chest: Clear to auscultation bilaterally.  CVS: S1, S2 no murmurs, no S3.Regular rate.  ABD: Soft right lower quadrant tender , no guarding or rebound, normal bowel sounds, maximal tenderness in inguinal region  Ext: No edema  MS: decreased  ROM lumbar  Spine, adequate in  shoulders, hips and knees.  Skin: Intact, no ulcerations or rash noted.  Psych: Good eye contact, normal affect. Memory intact not anxious or depressed appearing.  CNS: CN 2-12 intact, power,  normal throughout.no focal deficits noted.   Assessment &  Plan  Low back pain with radiation Uncontrolled.Toradol and depo medrol administered IM in the office , to be followed by a short course of oral prednisone and NSAIDS.   Encounter for chronic pain management The patient's Controlled Substance registry is reviewed and compliance confirmed. Adequacy of  Pain control and level of function is assessed. Medication dosing is adjusted as deemed appropriate. Twelve weeks of medication is prescribed , patient signs for the script and is provided with a follow up appointment between 11 to 12 weeks .   Essential hypertension, benign Controlled, no change in medication DASH diet and commitment to daily physical activity for a minimum of 30 minutes discussed and encouraged, as a part of hypertension management. The importance of attaining a healthy weight is also discussed.  BP/Weight 08/23/2017 08/08/2017 06/04/2017 05/14/2017 04/19/2017 02/77/4128 11/05/6765  Systolic BP 209 470 962 836 629 476 546  Diastolic BP 86 80 91 82 82 82 90  Wt. (Lbs) 170 169.08 167.25 163 163.12 158.25 160.5  BMI 33.2 33.02 32.66 31.83 31.86 30.91 26.71  Some encounter information is confidential and restricted. Go to Review Flowsheets activity to see all data.       Obesity (BMI 30.0-34.9) Deteriorated. Patient re-educated about  the importance of commitment to a  minimum of 150 minutes of exercise per week.  The importance of healthy food choices with portion control discussed. Encouraged to start a food diary, count calories and to consider  joining a support group. Sample diet sheets offered. Goals set by the patient for the next several months.  Weight /BMI 08/23/2017 08/08/2017 06/04/2017  WEIGHT 170 lb 169 lb 1.3 oz 167 lb 4 oz  HEIGHT 5\' 0"  5\' 0"  5\' 0"   BMI 33.2 kg/m2 33.02 kg/m2 32.66 kg/m2  Some encounter information is confidential and restricted. Go to Review Flowsheets activity to see all data.      Anxiety Controlled, no change in medication

## 2017-08-23 NOTE — Patient Instructions (Addendum)
Annual physical exam with pap in 12 weeks , call if you need me before     Please schedule mammogram at checkout  Toradol 37m and depo medrol 80 mg iM are administered IM for back pain radiaiting to right groin  Please get X ray of low back today , the order is enteredand prednisone and flexeril are sent in for back pain  Labs that you need 1 week prior to follow up will be ordered and you need to go get them done  Please work on weight loss  Labs needed are Fasting CBC, lipid, cmp and EGFR, TSH and vit D

## 2017-08-24 ENCOUNTER — Telehealth: Payer: Self-pay

## 2017-08-24 MED ORDER — OXYCODONE-ACETAMINOPHEN 5-325 MG PO TABS: ORAL_TABLET | ORAL | 0 refills | Status: AC

## 2017-08-24 MED ORDER — ALPRAZOLAM 1 MG PO TABS: ORAL_TABLET | ORAL | 5 refills | Status: DC

## 2017-08-24 NOTE — Telephone Encounter (Signed)
error 

## 2017-08-26 ENCOUNTER — Encounter: Payer: Self-pay | Admitting: Family Medicine

## 2017-08-26 NOTE — Assessment & Plan Note (Signed)
Deteriorated. Patient re-educated about  the importance of commitment to a  minimum of 150 minutes of exercise per week.  The importance of healthy food choices with portion control discussed. Encouraged to start a food diary, count calories and to consider  joining a support group. Sample diet sheets offered. Goals set by the patient for the next several months.   Weight /BMI 08/23/2017 08/08/2017 06/04/2017  WEIGHT 170 lb 169 lb 1.3 oz 167 lb 4 oz  HEIGHT 5\' 0"  5\' 0"  5\' 0"   BMI 33.2 kg/m2 33.02 kg/m2 32.66 kg/m2  Some encounter information is confidential and restricted. Go to Review Flowsheets activity to see all data.

## 2017-08-26 NOTE — Assessment & Plan Note (Signed)
Controlled, no change in medication DASH diet and commitment to daily physical activity for a minimum of 30 minutes discussed and encouraged, as a part of hypertension management. The importance of attaining a healthy weight is also discussed.  BP/Weight 08/23/2017 08/08/2017 06/04/2017 05/14/2017 04/19/2017 48/59/2763 01/03/3199  Systolic BP 379 444 619 012 224 114 643  Diastolic BP 86 80 91 82 82 82 90  Wt. (Lbs) 170 169.08 167.25 163 163.12 158.25 160.5  BMI 33.2 33.02 32.66 31.83 31.86 30.91 26.71  Some encounter information is confidential and restricted. Go to Review Flowsheets activity to see all data.

## 2017-08-26 NOTE — Assessment & Plan Note (Signed)
The patient's Controlled Substance registry is reviewed and compliance confirmed. Adequacy of  Pain control and level of function is assessed. Medication dosing is adjusted as deemed appropriate. Twelve weeks of medication is prescribed , patient signs for the script and is provided with a follow up appointment between 11 to 12 weeks .  

## 2017-08-26 NOTE — Assessment & Plan Note (Signed)
Controlled, no change in medication  

## 2017-08-27 ENCOUNTER — Telehealth: Payer: Self-pay

## 2017-08-27 ENCOUNTER — Ambulatory Visit (HOSPITAL_COMMUNITY): Payer: BLUE CROSS/BLUE SHIELD

## 2017-08-27 DIAGNOSIS — Z13220 Encounter for screening for lipoid disorders: Secondary | ICD-10-CM

## 2017-08-27 DIAGNOSIS — I10 Essential (primary) hypertension: Secondary | ICD-10-CM

## 2017-08-27 DIAGNOSIS — E559 Vitamin D deficiency, unspecified: Secondary | ICD-10-CM

## 2017-08-27 DIAGNOSIS — J4520 Mild intermittent asthma, uncomplicated: Secondary | ICD-10-CM

## 2017-08-27 NOTE — Telephone Encounter (Signed)
Mailed lab order to pt. 

## 2017-08-27 NOTE — Telephone Encounter (Signed)
-----  Message from Fayrene Helper, MD sent at 08/26/2017  4:52 PM EDT ----- Regarding: labs for her next visit pls mail lab order for CBc, fasting lipid, cmp and EGFr, tSH and vit D, thanks

## 2017-08-29 ENCOUNTER — Ambulatory Visit (HOSPITAL_COMMUNITY)
Admission: RE | Admit: 2017-08-29 | Discharge: 2017-08-29 | Disposition: A | Discharge status: Home / Self Care | Payer: BLUE CROSS/BLUE SHIELD | Source: Ambulatory Visit | Attending: Family Medicine | Admitting: Family Medicine

## 2017-08-29 ENCOUNTER — Encounter (HOSPITAL_COMMUNITY): Payer: Self-pay

## 2017-08-29 DIAGNOSIS — Z1231 Encounter for screening mammogram for malignant neoplasm of breast: Secondary | ICD-10-CM | POA: Diagnosis not present

## 2017-08-30 ENCOUNTER — Telehealth: Payer: Self-pay | Admitting: Family Medicine

## 2017-08-30 NOTE — Telephone Encounter (Signed)
Patient called in to request the order for an ultrasound of right pelvic area, states she is still having pain.

## 2017-09-04 ENCOUNTER — Other Ambulatory Visit: Payer: Self-pay | Admitting: Family Medicine

## 2017-09-04 DIAGNOSIS — R102 Pelvic and perineal pain: Secondary | ICD-10-CM

## 2017-09-04 NOTE — Telephone Encounter (Signed)
Order has been entered , please schedule when able and let her know this is being worked on

## 2017-09-04 NOTE — Progress Notes (Signed)
[place] pelis

## 2017-09-04 NOTE — Telephone Encounter (Signed)
Said you told her to call back if she wanted imaging done and that it was offered at the visit

## 2017-09-07 NOTE — Telephone Encounter (Signed)
Pt scheduled and is aware

## 2017-09-10 ENCOUNTER — Ambulatory Visit (HOSPITAL_COMMUNITY)
Admission: RE | Admit: 2017-09-10 | Discharge: 2017-09-10 | Disposition: A | Discharge status: Home / Self Care | Payer: BLUE CROSS/BLUE SHIELD | Source: Ambulatory Visit | Attending: Family Medicine | Admitting: Family Medicine

## 2017-09-10 DIAGNOSIS — R102 Pelvic and perineal pain: Secondary | ICD-10-CM | POA: Diagnosis not present

## 2017-09-10 DIAGNOSIS — D259 Leiomyoma of uterus, unspecified: Secondary | ICD-10-CM | POA: Insufficient documentation

## 2017-09-11 ENCOUNTER — Ambulatory Visit (HOSPITAL_COMMUNITY): Admission: RE | Admit: 2017-09-11 | Payer: BLUE CROSS/BLUE SHIELD | Source: Ambulatory Visit

## 2017-09-21 ENCOUNTER — Telehealth: Payer: Self-pay | Admitting: Family Medicine

## 2017-09-21 NOTE — Telephone Encounter (Signed)
Patient called and said she has foot burning and wast to see a foot doctor.  Can you put in a referral for her?   Her next appt is 11/27/17

## 2017-09-25 DIAGNOSIS — L411 Pityriasis lichenoides chronica: Secondary | ICD-10-CM | POA: Diagnosis not present

## 2017-09-25 DIAGNOSIS — L299 Pruritus, unspecified: Secondary | ICD-10-CM | POA: Diagnosis not present

## 2017-09-28 ENCOUNTER — Other Ambulatory Visit: Payer: Self-pay | Admitting: Family Medicine

## 2017-09-28 DIAGNOSIS — R208 Other disturbances of skin sensation: Secondary | ICD-10-CM

## 2017-09-28 NOTE — Telephone Encounter (Signed)
Referred tp podiatry please make appt and let her know

## 2017-10-02 NOTE — Telephone Encounter (Signed)
Sent through [facility] portal to [place] as well as an internal referral, will check in the morning for appointment.

## 2017-10-03 NOTE — Telephone Encounter (Signed)
Noted, thanks!

## 2017-10-08 ENCOUNTER — Other Ambulatory Visit: Payer: Self-pay | Admitting: Family Medicine

## 2017-10-15 ENCOUNTER — Ambulatory Visit: Payer: BLUE CROSS/BLUE SHIELD | Admitting: Podiatry

## 2017-10-22 ENCOUNTER — Ambulatory Visit: Payer: BLUE CROSS/BLUE SHIELD | Admitting: Podiatry

## 2017-10-30 ENCOUNTER — Encounter: Payer: Self-pay | Admitting: Sports Medicine

## 2017-10-30 ENCOUNTER — Ambulatory Visit (INDEPENDENT_AMBULATORY_CARE_PROVIDER_SITE_OTHER): Payer: BLUE CROSS/BLUE SHIELD | Admitting: Sports Medicine

## 2017-10-30 ENCOUNTER — Other Ambulatory Visit: Payer: Self-pay | Admitting: Sports Medicine

## 2017-10-30 ENCOUNTER — Ambulatory Visit (INDEPENDENT_AMBULATORY_CARE_PROVIDER_SITE_OTHER): Payer: BLUE CROSS/BLUE SHIELD

## 2017-10-30 VITALS — BP 131/89 | HR 78 | Resp 16

## 2017-10-30 DIAGNOSIS — M79672 Pain in left foot: Secondary | ICD-10-CM | POA: Diagnosis not present

## 2017-10-30 DIAGNOSIS — M779 Enthesopathy, unspecified: Secondary | ICD-10-CM

## 2017-10-30 DIAGNOSIS — M722 Plantar fascial fibromatosis: Secondary | ICD-10-CM

## 2017-10-30 DIAGNOSIS — M21619 Bunion of unspecified foot: Secondary | ICD-10-CM

## 2017-10-30 DIAGNOSIS — M79671 Pain in right foot: Secondary | ICD-10-CM | POA: Diagnosis not present

## 2017-10-30 DIAGNOSIS — M792 Neuralgia and neuritis, unspecified: Secondary | ICD-10-CM | POA: Diagnosis not present

## 2017-10-30 MED ORDER — TRIAMCINOLONE ACETONIDE 40 MG/ML IJ SUSP: 20.0000 mg | Freq: Once | INTRAMUSCULAR | Status: DC

## 2017-10-30 MED ORDER — MELOXICAM 15 MG PO TABS: 15.0000 mg | ORAL_TABLET | Freq: Every day | ORAL | 0 refills | Status: DC

## 2017-10-30 MED ORDER — METHYLPREDNISOLONE 4 MG PO TBPK: ORAL_TABLET | ORAL | 0 refills | Status: DC

## 2017-10-30 NOTE — Patient Instructions (Signed)

## 2017-10-30 NOTE — Progress Notes (Signed)
Subjective: Rebecca Boyd is a 42 y.o. female patient presents to office with complaint of 1. moderate heel and arch pain 10/10 on the left>right. Patient admits to post static dyskinesia for 1 month in duration. Patient has treated this problem with changing shoes with no relief. 2. Pain to medial 1st toes that burns in nature that is 10/10 with constant throbbing that also started 1 month ago. 3. Pain to left lateral ankle that also started 1 month ago with swelling that is constant. Denies any other pedal complaints.   Review of Systems  Musculoskeletal: Positive for joint pain and myalgias.  Neurological: Positive for tingling.     Patient Active Problem List   Diagnosis Date Noted  . Rash and nonspecific skin eruption 05/14/2017  . Chronic migraine 12/06/2016  . Encounter for chronic pain management 05/10/2016  . Headache 03/16/2016  . Obesity (BMI 30.0-34.9) 03/17/2015  . Back pain with radiation 08/30/2014  . Essential hypertension, benign 01/07/2014  . Cervical neck pain with evidence of disc disease 10/02/2013  . Low back pain with radiation 07/30/2013  . 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 Asthma, mild intermittent 11/14/2012  . Vitamin D deficiency 10/27/2010  . Anxiety 10/26/2010    Current Outpatient Medications on File Prior to Visit  Medication Sig Dispense Refill  . ALPRAZolam (XANAX) 1 MG tablet TAKE 1 TABLET BY MOUTH AT BEDTIME AS NEEDED FOR ANXIETY. 30 tablet 0  . ALPRAZolam (XANAX) 1 MG tablet One tablet at bedtime, for anxiety. 30 tablet 5  . amLODipine (NORVASC) 2.5 MG tablet TAKE 1 TABLET BY MOUTH ONCE DAILY 90 tablet 1  . cyclobenzaprine (FLEXERIL) 10 MG tablet One tablet at bedtime for back pain and spasm as needed 30 tablet 0  . doxycycline (VIBRA-TABS) 100 MG tablet Take 1 tablet (100 mg total) by mouth 2 (two) times daily. 20 tablet 0  . fluticasone (FLONASE) 50 MCG/ACT nasal spray Place 2 sprays into both nostrils daily. (Patient taking differently:  Place 2 sprays into both nostrils daily as needed for allergies. ) 16 g 1  . metroNIDAZOLE (FLAGYL) 500 MG tablet Take 1 tablet (500 mg total) by mouth 2 (two) times daily. 28 tablet 0  . nortriptyline (PAMELOR) 10 MG capsule Take 2 capsules (20 mg total) by mouth at bedtime. 60 capsule 11  . oxyCODONE-acetaminophen (PERCOCET) 5-325 MG tablet One tablet at bedtime for back pain 30 tablet 0  . oxyCODONE-acetaminophen (PERCOCET/ROXICET) 5-325 MG tablet Take 1 tablet by mouth at bedtime.     . predniSONE (STERAPRED UNI-PAK 21 TAB) 5 MG (21) TBPK tablet Take 1 tablet (5 mg total) by mouth as directed. Use as directed 21 tablet 0  . PROAIR HFA 108 (90 Base) MCG/ACT inhaler INHALE 2 PUFFS INTO THE LUNGS EVERY 6 HOURS AS NEEDED FOR SHORTNESS OF BREATH OR WHEEZING. 8.5 g 0  . rizatriptan (MAXALT-MLT) 10 MG disintegrating tablet Take 1 tablet (10 mg total) by mouth as needed. May repeat in 2 hours if needed 15 tablet 11  . SUMAtriptan (IMITREX) 6 MG/0.5ML SOLN injection Inject 0.5 mLs (6 mg total) into the skin every 2 (two) hours as needed for migraine or headache. May repeat in 2 hours if headache persists or recurs. 10 vial 11  . tiZANidine (ZANAFLEX) 4 MG tablet Take 1 tablet (4 mg total) by mouth every 6 (six) hours as needed for muscle spasms. 30 tablet 6  . topiramate (TOPAMAX) 100 MG tablet Take 1 tablet (100 mg total) by mouth 2 (two) times  daily. 60 tablet 11  . triamcinolone cream (KENALOG) 0.1 % Apply to the skin neck down 1-2 times a day as needed for rash    . ibuprofen (ADVIL,MOTRIN) 800 MG tablet Take 800 mg by mouth every 8 (eight) hours as needed.  0   Current Facility-Administered Medications on File Prior to Visit  Medication Dose Route Frequency Provider Last Rate Last Dose  . cefTRIAXone (ROCEPHIN) injection 250 mg  250 mg Intramuscular Once Raylene Everts, MD        Allergies  Allergen Reactions  . Diphenhydramine Hcl Anaphylaxis    (Benadryl)  . Orange Fruit Hives, Shortness  Of Breath, Itching and Other (See Comments)    MAKES IT DIFFICULT TO BREATH  . Latex Rash    Objective: Physical Exam General: The patient is alert and oriented x3 in no acute distress.  Dermatology: Skin is warm, dry and supple bilateral lower extremities. Nails 1-10 are normal. There is no erythema, edema, no eccymosis, no open lesions present. Integument is otherwise unremarkable.  Vascular: Dorsalis Pedis pulse and Posterior Tibial pulse are 2/4 bilateral. Capillary fill time is immediate to all digits.  Neurological: Grossly intact to light touch with an achilles reflex of +2/5 and a  negative Tinel's sign bilateral. Subjective burning and tingling at 1st toes medial aspect bilateral.   Musculoskeletal: Tenderness to palpation at the medial calcaneal tubercale and through the insertion of the plantar fascia on the left>right foot. Pain to lateral ankle along peroneals on left. No pain with compression of calcaneus bilateral. No pain with tuning fork to calcaneus bilateral. No pain with calf compression bilateral. There is decreased Ankle joint range of motion bilateral. All other joints range of motion within normal limits bilateral. Strength 5/5 in all groups bilateral. +Bunion and planus bilateral.   Gait: Unassisted, Antalgic avoid weight on left/right heel  Xray, Right/Left foot:  Normal osseous mineralization. Joint spaces preserved with early increased IM L>R supportive of early bunion. No fracture/dislocation/boney destruction. Calcaneal spur present with mild thickening of plantar fascia. No other soft tissue abnormalities or radiopaque foreign bodies.   Assessment and Plan: Problem List Items Addressed This Visit    None    Visit Diagnoses    Plantar fasciitis of left foot    -  Primary   Relevant Medications   meloxicam (MOBIC) 15 MG tablet   methylPREDNISolone (MEDROL DOSEPAK) 4 MG TBPK tablet   triamcinolone acetonide (KENALOG-40) injection 20 mg (Start on 10/30/2017   3:15 PM)   Foot pain, bilateral       Relevant Medications   meloxicam (MOBIC) 15 MG tablet   methylPREDNISolone (MEDROL DOSEPAK) 4 MG TBPK tablet   Other Relevant Orders   DG Foot Complete Right   DG Foot Complete Left   Tendonitis       Relevant Medications   methylPREDNISolone (MEDROL DOSEPAK) 4 MG TBPK tablet   Neuritis       Bunion         -Complete examination performed.  -Xrays reviewed -Discussed with patient in detail the condition of plantar fasciitis, tendonitis and neuritis, how this occurs and general treatment options. Explained both conservative and surgical treatments.  -After oral consent and aseptic prep, injected a mixture containing 1 ml of 2%  plain lidocaine, 1 ml 0.5% plain marcaine, 0.5 ml of kenalog 40 and 0.5 ml of dexamethasone phosphate into left heel. Post-injection care discussed with patient.  -Rx Meloxicam to start after Medrol dose pack is completed - Explained in  detail the use of the fascial brace for left which was dispensed at today's visit. -Explained and dispensed to patient daily stretching exercises. -Recommend patient to ice affected area 1-2x daily. -Recommend bunion shields and good supportive shoes  -Patient to return to office in 4 weeks for follow up or sooner if problems or questions arise.  Landis Martins, DPM

## 2017-11-23 ENCOUNTER — Telehealth: Payer: Self-pay | Admitting: Family Medicine

## 2017-11-23 NOTE — Telephone Encounter (Signed)
Patient called in to request refill percocet [facility] Cb#: [phone]/ [phone]

## 2017-11-23 NOTE — Telephone Encounter (Signed)
Has appt Tuesday

## 2017-11-26 ENCOUNTER — Other Ambulatory Visit: Payer: Self-pay | Admitting: Family Medicine

## 2017-11-26 MED ORDER — OXYCODONE-ACETAMINOPHEN 5-325 MG PO TABS: ORAL_TABLET | ORAL | 0 refills | Status: AC

## 2017-11-26 NOTE — Telephone Encounter (Signed)
Pt is out of Percocet, and would like it Filled Prior to her Appointment. If that is a problem, please call the patient.

## 2017-11-26 NOTE — Progress Notes (Signed)
Oxycodone 5  

## 2017-11-26 NOTE — Telephone Encounter (Signed)
Pt has appt tomorrow

## 2017-11-26 NOTE — Telephone Encounter (Signed)
Medication for 1 month is sent

## 2017-11-27 ENCOUNTER — Encounter: Payer: Self-pay | Admitting: Family Medicine

## 2017-11-27 ENCOUNTER — Other Ambulatory Visit (HOSPITAL_COMMUNITY)
Admission: RE | Admit: 2017-11-27 | Discharge: 2017-11-27 | Disposition: A | Discharge status: Home / Self Care | Payer: BLUE CROSS/BLUE SHIELD | Source: Ambulatory Visit | Attending: Family Medicine | Admitting: Family Medicine

## 2017-11-27 ENCOUNTER — Ambulatory Visit (INDEPENDENT_AMBULATORY_CARE_PROVIDER_SITE_OTHER): Payer: BLUE CROSS/BLUE SHIELD | Admitting: Family Medicine

## 2017-11-27 ENCOUNTER — Other Ambulatory Visit: Payer: Self-pay

## 2017-11-27 VITALS — BP 136/86 | HR 82 | Resp 12 | Ht 60.0 in | Wt 164.0 lb

## 2017-11-27 DIAGNOSIS — F323 Major depressive disorder, single episode, severe with psychotic features: Secondary | ICD-10-CM

## 2017-11-27 DIAGNOSIS — N939 Abnormal uterine and vaginal bleeding, unspecified: Secondary | ICD-10-CM | POA: Diagnosis not present

## 2017-11-27 DIAGNOSIS — G8929 Other chronic pain: Secondary | ICD-10-CM | POA: Diagnosis not present

## 2017-11-27 DIAGNOSIS — Z0001 Encounter for general adult medical examination with abnormal findings: Secondary | ICD-10-CM | POA: Diagnosis not present

## 2017-11-27 DIAGNOSIS — Z0000 Encounter for general adult medical examination without abnormal findings: Secondary | ICD-10-CM

## 2017-11-27 LAB — POCT URINE PREGNANCY: PREG TEST UR: NEGATIVE

## 2017-11-27 NOTE — Progress Notes (Signed)
    Rebecca Boyd     MRN: [id number]      DOB: 05/19/1975  HPI: Patient is in for annual physical exam. Chronic pain management is addressed at this visits, C/o depression as well as auditory hallucinations has had this in the past, not suicidal or homicidal, feels the need for psychiatric treatment. Increasingly withdrawn C/o abnormal / irregular uterine bleeding Immunization is reviewed ,   PE: BP 136/86 (BP Location: Left Arm, Patient Position: Sitting, Cuff Size: Large)   Pulse 82   Ht 5' (1.524 m)   Wt 164 lb (74.4 kg)   SpO2 98%   BMI 32.03 kg/m   Pleasant  female, alert and oriented x 3, in no cardio-pulmonary distress. Afebrile. HEENT No facial trauma or asymetry. Sinuses non tender.  Extra occullar muscles intact,  External ears normal, tympanic membranes clear. Oropharynx moist, no exudate. Neck: supple, no adenopathy,JVD or thyromegaly.No bruits.  Chest: Clear to ascultation bilaterally.No crackles or wheezes. Non tender to palpation  Breast: No asymetry,no masses or lumps. No tenderness. No nipple discharge or inversion. No axillary or supraclavicular adenopathy  Cardiovascular system; Heart sounds normal,  S1 and  S2 ,no S3.  No murmur, or thrill. Apical beat not displaced Peripheral pulses normal.  Abdomen: Soft, non tender, no organomegaly or masses. No bruits. Bowel sounds normal. No guarding, tenderness or rebound.    GU: External genitalia normal female genitalia , normal female distribution of hair. No lesions. Urethral meatus normal in size, no  Prolapse, no lesions visibly  Present. Bladder non tender. Vagina pink and moist , with no visible lesions , white fishy smelling discharge present . Adequate pelvic support no  cystocele or rectocele noted Cervix pink and appears healthy, no lesions or ulcerations noted, brown discharge noted from os Uterus normal size, no adnexal masses, no cervical motion or adnexal  tenderness.   Musculoskeletal exam: Full ROM of spine, hips , shoulders and knees. No deformity ,swelling or crepitus noted. No muscle wasting or atrophy.   Neurologic: Cranial nerves 2 to 12 intact. Power, tone ,sensation and reflexes normal throughout. No disturbance in gait. No tremor.  Skin: Intact, no ulceration, erythema , scaling or rash noted. Pigmentation normal throughout  Psych; Depression with auditory and visual hallucinations in the past 4 to 6 weeks. Not threatening Assessment & Plan:  Annual physical exam Annual exam as documented. Counseling done  re healthy lifestyle involving commitment to 150 minutes exercise per week, heart healthy diet, and attaining healthy weight.The importance of adequate sleep also discussed.  Immunization and cancer screening needs are specifically addressed at this visit.   Encounter for chronic pain management The patient's Controlled Substance registry is reviewed and compliance confirmed. Adequacy of  Pain control and level of function is assessed. Medication dosing is adjusted as deemed appropriate. Twelve weeks of medication is prescribed , patient signs for the script and is provided with a follow up appointment between 11 to 12 weeks .   Abnormal vaginal bleeding Cervix firm and uterus normal size. 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

## 2017-11-27 NOTE — Patient Instructions (Addendum)
F/U in 11 weeks, call if you need me sooner  You are being referred to therapist who call you on cell after 3 pm and also to Psychiatry  Labs  Form  Today for labs asap   Ureine test for pregnancy is negative

## 2017-11-28 ENCOUNTER — Telehealth: Payer: Self-pay

## 2017-11-30 LAB — CYTOLOGY - PAP
Adequacy: ABSENT
BACTERIAL VAGINITIS: POSITIVE — AB
CANDIDA VAGINITIS: NEGATIVE
CHLAMYDIA, DNA PROBE: NEGATIVE
DIAGNOSIS: NEGATIVE
HPV: NOT DETECTED
NEISSERIA GONORRHEA: NEGATIVE
Trichomonas: NEGATIVE

## 2017-11-30 NOTE — Progress Notes (Signed)
[facility]  PATIENT: Rebecca Boyd DOB: [birth year]   REASON FOR VISIT: Follow-up for migraine HISTORY FROM: Patient    HISTORY OF PRESENT ILLNESS: Rebecca Boyd a 42 years old right-handed female, seen in refer by her primary care doctor Fayrene Helper, for evaluation of headache, initial evaluation was December 06 2016.  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  She reported a history of migraine headaches since 2007, used to be intermittent, couple times each months, but since June 2018, she reported increased headache, to almost daily bases, constant low-grade lateralized headache, frequent exacerbation to severe pounding headache with associated light noise sensitivity, nauseous, sometimes associated seeing flashlight prior to and during her severe migraine headache, her headache can last for couple days,  She was started on Topamax 100 mg twice a day about 2 months ago, with mild side effect, but no significant help, she also takes Imitrex 50 mg as needed, is helpful most of the time,  Trigger for her migraine headaches are weather change, bright light, strong smells,  UPDATE Sept 6 2018: She has tried imitrex 50mg  as needed few times over past one month, she throw up each time.  Today, she complains of 2 weeks of severe left parietal area sharp headaches,she has tried imitrex, phenergan did not help.   She is able to tolerate topamax 100mg  bid, and nortriptyline 10 mg 2 tablets every night,  UPDATE Feb 4th 2019:Rebecca Boyd Her migraine has much improved, now she has it about twice a month, lasting for 2 days, she throw up maxalt dissolvable sometimes because of severe nausea during  migraine headaches,  UPDATE 8/5/2019CM Rebecca Boyd, 42 year old female returns for follow-up with history of migraine headaches.  She is doing much better with her  migraines has maybe 2/ month.  She is currently on nortriptyline 20 mg at bedtime Topamax 100 twice a day, Imitrex and Maxalt acutely as needed.  She is not aware of any specific foods that cause problems.  She returns for reevaluation REVIEW OF SYSTEMS: Full 14 system review of systems performed and notable only for those listed, all others are neg:  Constitutional: neg  Cardiovascular: neg Ear/Nose/Throat: neg  Skin: neg Eyes: neg Respiratory: neg Gastroitestinal: neg  Hematology/Lymphatic: neg  Endocrine: neg Musculoskeletal:neg Allergy/Immunology: neg Neurological: History of migraine Psychiatric: neg Sleep : neg   ALLERGIES: Allergies  Allergen Reactions  . Diphenhydramine Hcl Anaphylaxis    (Benadryl)  . Orange Fruit Hives, Shortness Of Breath, Itching and Other (See Comments)    MAKES IT DIFFICULT TO BREATH  . Latex Rash    HOME MEDICATIONS: Outpatient Medications Prior to Visit  Medication Sig Dispense Refill  . ALPRAZolam (XANAX) 1 MG tablet One tablet at bedtime, for anxiety. 30 tablet 5  . amLODipine (NORVASC) 2.5 MG tablet TAKE 1 TABLET BY MOUTH ONCE DAILY 90 tablet 1  . cyclobenzaprine (FLEXERIL) 10 MG tablet One tablet at bedtime for back pain and spasm as needed 30 tablet 0  . [START ON 12/07/2017] fluconazole (DIFLUCAN) 150 MG tablet Take one tablet once for one dose, on completion of 7 day antibiotic course , for vaginal itch from yeast 1 tablet 0  . fluticasone (FLONASE) 50 MCG/ACT nasal spray Place 2 sprays into both nostrils daily. (Patient taking differently: Place 2 sprays into both nostrils daily as needed for allergies. ) 16 g 1  . meloxicam (MOBIC) 15  MG tablet Take 1 tablet (15 mg total) by mouth daily. 30 tablet 0  . metroNIDAZOLE (FLAGYL) 500 MG tablet Take 1 tablet (500 mg total) by mouth 2 (two) times daily for 7 days. 14 tablet 0  . nortriptyline (PAMELOR) 10 MG capsule Take 2 capsules (20 mg total) by mouth at bedtime. 60 capsule 11  . [START ON  12/27/2017] oxyCODONE-acetaminophen (PERCOCET) 5-325 MG tablet Take one tablet by mouth daily at bedtime for chronic back pain 30 tablet 0  . [START ON 01/26/2018] oxyCODONE-acetaminophen (PERCOCET) 5-325 MG tablet Take one tablet daily by mouth at bedtime for chronic back pain 30 tablet 0  . oxyCODONE-acetaminophen (PERCOCET/ROXICET) 5-325 MG tablet Take one tablet at bedtime for back pain 30 tablet 0  . PROAIR HFA 108 (90 Base) MCG/ACT inhaler INHALE 2 PUFFS INTO THE LUNGS EVERY 6 HOURS AS NEEDED FOR SHORTNESS OF BREATH OR WHEEZING. 8.5 g 0  . rizatriptan (MAXALT-MLT) 10 MG disintegrating tablet Take 1 tablet (10 mg total) by mouth as needed. May repeat in 2 hours if needed 15 tablet 11  . SUMAtriptan (IMITREX) 6 MG/0.5ML SOLN injection Inject 0.5 mLs (6 mg total) into the skin every 2 (two) hours as needed for migraine or headache. May repeat in 2 hours if headache persists or recurs. 10 vial 11  . tiZANidine (ZANAFLEX) 4 MG tablet Take 1 tablet (4 mg total) by mouth every 6 (six) hours as needed for muscle spasms. 30 tablet 6  . topiramate (TOPAMAX) 100 MG tablet Take 1 tablet (100 mg total) by mouth 2 (two) times daily. 60 tablet 11  . triamcinolone cream (KENALOG) 0.1 % Apply to the skin neck down 1-2 times a day as needed for rash    . ALPRAZolam (XANAX) 1 MG tablet TAKE 1 TABLET BY MOUTH AT BEDTIME AS NEEDED FOR ANXIETY. 30 tablet 0   Facility-Administered Medications Prior to Visit  Medication Dose Route Frequency Provider Last Rate Last Dose  . cefTRIAXone (ROCEPHIN) injection 250 mg  250 mg Intramuscular Once Raylene Everts, MD      . triamcinolone acetonide (KENALOG-40) injection 20 mg  20 mg Other Once Landis Martins, DPM        PAST MEDICAL HISTORY: Past Medical History:  Diagnosis Date  . Anemia   . Anxiety   . Depressive disorder, not elsewhere classified   . Hypertension   . Migraine   . Migraines   . Miscarriage 2009  . Neck pain   . Vaginitis    cyctitis    PAST  SURGICAL HISTORY: Past Surgical History:  Procedure Laterality Date  . cervical cryotherapy N/A 1999  . DILITATION & CURRETTAGE/HYSTROSCOPY WITH THERMACHOICE ABLATION  08/26/2013   Procedure: DILATATION & CURETTAGE/HYSTEROSCOPY WITH THERMACHOICE ENDOMETRIAL ABLATION Procedure #2 Total Therapy Time=min       sec;  Surgeon: Jonnie Kind, MD;  Location: [facility];  Service: Gynecology;;  . LAPAROSCOPIC BILATERAL SALPINGECTOMY Bilateral 08/26/2013   Procedure: LAPAROSCOPIC BILATERAL SALPINGECTOMY AND REMOVAL OF LEFT PERITUBAL CYST Procedure #1;  Surgeon: Jonnie Kind, MD;  Location: [facility];  Service: Gynecology;  Laterality: Bilateral;  . LAPAROSCOPIC LYSIS OF ADHESIONS  08/26/2013   Procedure: LAPAROSCOPIC LYSIS OF ADHESIONS Procedure #1;  Surgeon: Jonnie Kind, MD;  Location: [facility];  Service: Gynecology;;  . Carmelia Bake LESION REMOVAL  08/26/2013   Procedure: REMOVAL OF VULVAR SEBACEOUS CYST Procedure #3;  Surgeon: Jonnie Kind, MD;  Location: [facility];  Service: Gynecology;;    FAMILY HISTORY: Family History  Problem Relation Age of Onset  . Drug abuse Mother   . Hypertension Father   . Hypertension Maternal Grandmother   . Hypertension Maternal Grandfather   . Hypertension Paternal Grandmother     SOCIAL HISTORY: Social History   Socioeconomic History  . Marital status: Single    Spouse name: Not on file  . Number of children: Not on file  . Years of education: Not on file  . Highest education level: Not on file  Occupational History  . Not on file  Social Needs  . Financial resource strain: Not on file  . Food insecurity:    Worry: Not on file    Inability: Not on file  . Transportation needs:    Medical: Not on file    Non-medical: Not on file  Tobacco Use  . Smoking status: Former Smoker    Packs/day: 0.25    Types: Cigars    Last attempt to quit: 01/06/2016    Years since quitting: 1.9  . Smokeless tobacco: Never Used  Substance and Sexual Activity  . Alcohol use:  Yes    Comment: occasional  . Drug use: No  . Sexual activity: Yes    Birth control/protection: None  Lifestyle  . Physical activity:    Days per week: Not on file    Minutes per session: Not on file  . Stress: Not on file  Relationships  . Social connections:    Talks on phone: Not on file    Gets together: Not on file    Attends religious service: Not on file    Active member of club or organization: Not on file    Attends meetings of clubs or organizations: Not on file    Relationship status: Not on file  . Intimate partner violence:    Fear of current or ex partner: Not on file    Emotionally abused: Not on file    Physically abused: Not on file    Forced sexual activity: Not on file  Other Topics Concern  . Not on file  Social History Narrative   Lives home with mother.  Works at [employer].  Education 10th grade/GED.  No children.  Single.       PHYSICAL EXAM  Vitals:   12/03/17 1554 12/03/17 1606  BP: (!) 152/120 (!) 150/110  Pulse: 86   SpO2: 99%   Weight: 164 lb 3.2 oz (74.5 kg)   Height: 5' (1.524 m)    Body mass index is 32.07 kg/m.  Generalized: Well developed, in no acute distress  Head: normocephalic and atraumatic,. Oropharynx benign  Neck: Supple, Musculoskeletal: No deformity   Neurological examination   Mentation: Alert oriented to time, place, history taking. Attention span and concentration appropriate. Recent and remote memory intact.  Follows all commands speech and language fluent.   Cranial nerve II-XII: .Pupils were equal round reactive to light extraocular movements were full, visual field were full on confrontational test. Facial sensation and strength were normal. hearing was intact to finger rubbing bilaterally. Uvula tongue midline. head turning and shoulder shrug were normal and symmetric.Tongue protrusion into cheek strength was normal. Motor: normal bulk and tone, full strength in the BUE, BLE, Sensory: normal and  symmetric to light touch, Coordination: finger-nose-finger, heel-to-shin bilaterally, no dysmetria Reflexes: Brachioradialis 2/2, biceps 2/2, triceps 2/2, patellar 2/2, Achilles 2/2, plantar responses were flexor bilaterally. Gait and Station: Rising up from seated position without assistance, normal stance,  moderate stride, good arm swing, smooth turning, able  to perform tiptoe, and heel walking without difficulty. Tandem gait is steady  DIAGNOSTIC DATA (LABS, IMAGING, TESTING) - I reviewed patient records, labs, notes, testing and imaging myself where available.  Lab Results  Component Value Date   WBC 5.9 08/13/2016   HGB 13.0 08/13/2016   HCT 36.7 08/13/2016   MCV 86.8 08/13/2016   PLT 149 (L) 08/13/2016      Component Value Date/Time   NA 140 08/13/2016 1645   K 3.8 08/13/2016 1645   CL 109 08/13/2016 1645   CO2 24 08/13/2016 1645   GLUCOSE 95 08/13/2016 1645   BUN 14 08/13/2016 1645   CREATININE 0.80 08/13/2016 1645   CREATININE 0.69 05/10/2016 1201   CALCIUM 9.3 08/13/2016 1645   PROT 6.8 05/10/2016 1201   ALBUMIN 4.1 05/10/2016 1201   AST 13 05/10/2016 1201   ALT 11 05/10/2016 1201   ALKPHOS 66 05/10/2016 1201   BILITOT 0.5 05/10/2016 1201   GFRNONAA >60 08/13/2016 1645   GFRAA >60 08/13/2016 1645   Lab Results  Component Value Date   CHOL 195 05/10/2016   HDL 59 05/10/2016   LDLCALC 117 (H) 05/10/2016   TRIG 96 05/10/2016   CHOLHDL 3.3 05/10/2016   Lab Results  Component Value Date   HGBA1C 5.4 05/29/2013   Lab Results  Component Value Date   VITAMINB12 450 10/26/2010   Lab Results  Component Value Date   TSH 1.12 05/10/2016      ASSESSMENT AND PLAN Rebecca Boyd is a 42 y.o. female   with chronic migraine which is in good control 2 headaches per month.                PLAN: Continue Topamax 100mg   twice daily Continue Pamelor 2 capsules by mouth at bedtime Continue Maxalt acutely or Imitrex acutely Given list of migraine triggers Use  migraine tracker free APP if migraines increase Follow-up in 8 months Dennie Bible, [facility], [facility], APRN  [facility] [address] [place], [place] [address] [phone]

## 2017-12-01 ENCOUNTER — Other Ambulatory Visit: Payer: Self-pay | Admitting: Family Medicine

## 2017-12-01 MED ORDER — FLUCONAZOLE 150 MG PO TABS: ORAL_TABLET | ORAL | 0 refills | Status: DC

## 2017-12-01 MED ORDER — METRONIDAZOLE 500 MG PO TABS: 500.0000 mg | ORAL_TABLET | Freq: Two times a day (BID) | ORAL | 0 refills | Status: AC

## 2017-12-01 NOTE — Progress Notes (Signed)
metronidaz 

## 2017-12-02 DIAGNOSIS — F323 Major depressive disorder, single episode, severe with psychotic features: Secondary | ICD-10-CM | POA: Insufficient documentation

## 2017-12-02 DIAGNOSIS — N939 Abnormal uterine and vaginal bleeding, unspecified: Secondary | ICD-10-CM | POA: Insufficient documentation

## 2017-12-02 MED ORDER — OXYCODONE-ACETAMINOPHEN 5-325 MG PO TABS: ORAL_TABLET | ORAL | 0 refills | Status: AC

## 2017-12-02 NOTE — Assessment & Plan Note (Signed)
Annual exam as documented. Counseling done  re healthy lifestyle involving commitment to 150 minutes exercise per week, heart healthy diet, and attaining healthy weight.The importance of adequate sleep also discussed.  Immunization and cancer screening needs are specifically addressed at this visit.  

## 2017-12-02 NOTE — Assessment & Plan Note (Signed)
Cervix firm and uterus normal size. Urine pregnancy test is negative

## 2017-12-02 NOTE — Assessment & Plan Note (Signed)
Not suicidal or homicidal, reports increased social withdrawal and auditory hallucinations, will refer to therapist as well as to Psychiatry

## 2017-12-02 NOTE — Assessment & Plan Note (Signed)
The patient's Controlled Substance registry is reviewed and compliance confirmed. Adequacy of  Pain control and level of function is assessed. Medication dosing is adjusted as deemed appropriate. Twelve weeks of medication is prescribed , patient signs for the script and is provided with a follow up appointment between 11 to 12 weeks .  

## 2017-12-03 ENCOUNTER — Ambulatory Visit (INDEPENDENT_AMBULATORY_CARE_PROVIDER_SITE_OTHER): Payer: BLUE CROSS/BLUE SHIELD | Admitting: Nurse Practitioner

## 2017-12-03 ENCOUNTER — Encounter: Payer: Self-pay | Admitting: Nurse Practitioner

## 2017-12-03 VITALS — BP 150/110 | HR 86 | Ht 60.0 in | Wt 164.2 lb

## 2017-12-03 DIAGNOSIS — G43709 Chronic migraine without aura, not intractable, without status migrainosus: Secondary | ICD-10-CM | POA: Diagnosis not present

## 2017-12-03 DIAGNOSIS — IMO0002 Reserved for concepts with insufficient information to code with codable children: Secondary | ICD-10-CM

## 2017-12-03 LAB — CERVICOVAGINAL ANCILLARY ONLY: Herpes: NEGATIVE

## 2017-12-03 NOTE — Progress Notes (Signed)
I have reviewed and agreed above plan. 

## 2017-12-03 NOTE — Patient Instructions (Signed)
Continue Topamax 100mg   twice daily Continue Pamelor 2 capsules by mouth at bedtime Continue Maxalt acutely or Imitrex acutely Given list of migraine triggers Use migraine tracker free APP if migraines increase Follow-up in 8 months

## 2017-12-04 ENCOUNTER — Ambulatory Visit: Payer: BLUE CROSS/BLUE SHIELD | Admitting: Sports Medicine

## 2017-12-04 ENCOUNTER — Telehealth: Payer: Self-pay | Admitting: *Deleted

## 2017-12-04 DIAGNOSIS — Z0289 Encounter for other administrative examinations: Secondary | ICD-10-CM

## 2017-12-04 NOTE — Telephone Encounter (Addendum)
Pt FMLA form fax to [employer] @ [phone]

## 2017-12-06 ENCOUNTER — Telehealth: Payer: Self-pay

## 2017-12-06 NOTE — Telephone Encounter (Signed)
2nd attempt - [facility]   

## 2017-12-10 ENCOUNTER — Other Ambulatory Visit: Payer: Self-pay

## 2017-12-10 ENCOUNTER — Encounter (HOSPITAL_COMMUNITY): Payer: Self-pay | Admitting: Emergency Medicine

## 2017-12-10 ENCOUNTER — Emergency Department (HOSPITAL_COMMUNITY)
Admission: EM | Admit: 2017-12-10 | Discharge: 2017-12-10 | Disposition: A | Discharge status: Home / Self Care | Payer: BLUE CROSS/BLUE SHIELD | Attending: Emergency Medicine | Admitting: Emergency Medicine

## 2017-12-10 DIAGNOSIS — I10 Essential (primary) hypertension: Secondary | ICD-10-CM | POA: Diagnosis not present

## 2017-12-10 DIAGNOSIS — Z79899 Other long term (current) drug therapy: Secondary | ICD-10-CM | POA: Insufficient documentation

## 2017-12-10 DIAGNOSIS — K047 Periapical abscess without sinus: Secondary | ICD-10-CM | POA: Insufficient documentation

## 2017-12-10 DIAGNOSIS — F17210 Nicotine dependence, cigarettes, uncomplicated: Secondary | ICD-10-CM | POA: Diagnosis not present

## 2017-12-10 DIAGNOSIS — K0889 Other specified disorders of teeth and supporting structures: Secondary | ICD-10-CM | POA: Diagnosis not present

## 2017-12-10 MED ORDER — AMOXICILLIN 250 MG PO CAPS
500.0000 mg | ORAL_CAPSULE | Freq: Once | ORAL | Status: AC
  Administered 2017-12-10: 500 mg via ORAL
  Filled 2017-12-10: qty 2

## 2017-12-10 MED ORDER — AMOXICILLIN 500 MG PO CAPS: 500.0000 mg | ORAL_CAPSULE | Freq: Three times a day (TID) | ORAL | 0 refills | Status: AC

## 2017-12-10 MED ORDER — HYDROCODONE-ACETAMINOPHEN 5-325 MG PO TABS
1.0000 | ORAL_TABLET | Freq: Once | ORAL | Status: AC
  Administered 2017-12-10: 1 via ORAL
  Filled 2017-12-10: qty 1

## 2017-12-10 MED ORDER — HYDROCODONE-ACETAMINOPHEN 5-325 MG PO TABS: 1.0000 | ORAL_TABLET | ORAL | 0 refills | Status: DC | PRN

## 2017-12-10 NOTE — Discharge Instructions (Signed)
Complete your entire course of antibiotics as prescribed.  You  may use the hydrocodone for pain relief but do not drive within 4 hours of taking as this will make you drowsy.  Avoid applying heat or ice to this abscess area which can worsen your symptoms.  You may use half peroxide and water swish and spit after meals to keep this area clean as discussed.  Follow up with your dentist as planned.

## 2017-12-10 NOTE — ED Provider Notes (Signed)
[facility] EMERGENCY DEPARTMENT Provider Note   CSN: [id number] Arrival date & time: 12/10/17  1214     History   Chief Complaint Chief Complaint  Patient presents with  . Dental Pain    HPI Rebecca Boyd is a 42 y.o. female presenting with a 1 day history of dental pain and gingival swelling.   The patient has a history of i decay in the tooth involved which has recently started to cause increased  Pain and now swelling.  She was seen by her dentist 3 weeks ago anticipating extraction of this tooth, but her blood pressure was elevated therefore this procedure was deferred.  There has been no fevers, chills, nausea or vomiting, also no complaint of difficulty swallowing, although chewing makes pain worse.  The patient has tried ibuprofen without relief of symptoms.    .  The history is provided by the patient.    Past Medical History:  Diagnosis Date  . Anemia   . Anxiety   . Depressive disorder, not elsewhere classified   . Hypertension   . Migraine   . Migraines   . Miscarriage 2009  . Neck pain   . Vaginitis    cyctitis    Patient Active Problem List   Diagnosis Date Noted  . Abnormal vaginal bleeding 12/02/2017  . Major depression with psychotic features ([place]) 12/02/2017  . Rash and nonspecific skin eruption 05/14/2017  . Chronic migraine 12/06/2016  . Encounter for chronic pain management 05/10/2016  . Headache 03/16/2016  . Annual physical exam 11/11/2015  . Obesity (BMI 30.0-34.9) 03/17/2015  . Back pain with radiation 08/30/2014  . Essential hypertension, benign 01/07/2014  . Cervical neck pain with evidence of disc disease 10/02/2013  . Low back pain with radiation 07/30/2013  . 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 Asthma, mild intermittent 11/14/2012  . Vitamin D deficiency 10/27/2010  . Anxiety 10/26/2010    Past Surgical History:  Procedure Laterality Date  . cervical cryotherapy N/A 1999  . DILITATION & CURRETTAGE/HYSTROSCOPY WITH  THERMACHOICE ABLATION  08/26/2013   Procedure: DILATATION & CURETTAGE/HYSTEROSCOPY WITH THERMACHOICE ENDOMETRIAL ABLATION Procedure #2 Total Therapy Time=min       sec;  Surgeon: Jonnie Kind, MD;  Location: [facility];  Service: Gynecology;;  . LAPAROSCOPIC BILATERAL SALPINGECTOMY Bilateral 08/26/2013   Procedure: LAPAROSCOPIC BILATERAL SALPINGECTOMY AND REMOVAL OF LEFT PERITUBAL CYST Procedure #1;  Surgeon: Jonnie Kind, MD;  Location: [facility];  Service: Gynecology;  Laterality: Bilateral;  . LAPAROSCOPIC LYSIS OF ADHESIONS  08/26/2013   Procedure: LAPAROSCOPIC LYSIS OF ADHESIONS Procedure #1;  Surgeon: Jonnie Kind, MD;  Location: [facility];  Service: Gynecology;;  . Carmelia Bake LESION REMOVAL  08/26/2013   Procedure: REMOVAL OF VULVAR SEBACEOUS CYST Procedure #3;  Surgeon: Jonnie Kind, MD;  Location: [facility];  Service: Gynecology;;     OB History   None      Home Medications    Prior to Admission medications   Medication Sig Start Date End Date Taking? Authorizing Provider  ALPRAZolam Duanne Moron) 1 MG tablet TAKE 1 TABLET BY MOUTH AT BEDTIME AS NEEDED FOR ANXIETY. 07/20/17   Fayrene Helper, MD  ALPRAZolam Duanne Moron) 1 MG tablet One tablet at bedtime, for anxiety. 08/24/17   Fayrene Helper, MD  amLODipine (NORVASC) 2.5 MG tablet TAKE 1 TABLET BY MOUTH ONCE DAILY 07/02/17   Fayrene Helper, MD  amoxicillin (AMOXIL) 500 MG capsule Take 1 capsule (500 mg total) by mouth 3 (three) times daily for  10 days. 12/10/17 12/20/17  Evalee Jefferson, PA-C  cyclobenzaprine (FLEXERIL) 10 MG tablet One tablet at bedtime for back pain and spasm as needed 08/23/17   Fayrene Helper, MD  fluconazole (DIFLUCAN) 150 MG tablet Take one tablet once for one dose, on completion of 7 day antibiotic course , for vaginal itch from yeast 12/07/17   Fayrene Helper, MD  fluticasone (FLONASE) 50 MCG/ACT nasal spray Place 2 sprays into both nostrils daily. Patient taking differently: Place 2 sprays into both nostrils  daily as needed for allergies.  08/10/16   Fayrene Helper, MD  HYDROcodone-acetaminophen (NORCO/VICODIN) 5-325 MG tablet Take 1 tablet by mouth every 4 (four) hours as needed. 12/10/17   Evalee Jefferson, PA-C  meloxicam (MOBIC) 15 MG tablet Take 1 tablet (15 mg total) by mouth daily. 10/30/17   Landis Martins, DPM  metroNIDAZOLE (FLAGYL) 500 MG tablet Take 1 tablet (500 mg total) by mouth 2 (two) times daily for 7 days. 12/03/17 12/10/17  Fayrene Helper, MD  nortriptyline (PAMELOR) 10 MG capsule Take 2 capsules (20 mg total) by mouth at bedtime. 06/04/17   [name], MD  oxyCODONE-acetaminophen (PERCOCET) 5-325 MG tablet Take one tablet by mouth daily at bedtime for chronic back pain 12/27/17 01/26/18  Fayrene Helper, MD  oxyCODONE-acetaminophen (PERCOCET) 5-325 MG tablet Take one tablet daily by mouth at bedtime for chronic back pain 01/26/18 02/26/18  Fayrene Helper, MD  oxyCODONE-acetaminophen (PERCOCET/ROXICET) 5-325 MG tablet Take one tablet at bedtime for back pain 11/26/17 12/27/17  Fayrene Helper, MD  PROAIR HFA 108 [id number] Base) MCG/ACT inhaler INHALE 2 PUFFS INTO THE LUNGS EVERY 6 HOURS AS NEEDED FOR SHORTNESS OF BREATH OR WHEEZING. 10/09/17   Fayrene Helper, MD  rizatriptan (MAXALT-MLT) 10 MG disintegrating tablet Take 1 tablet (10 mg total) by mouth as needed. May repeat in 2 hours if needed 06/04/17   [name], MD  SUMAtriptan (IMITREX) 6 MG/0.5ML SOLN injection Inject 0.5 mLs (6 mg total) into the skin every 2 (two) hours as needed for migraine or headache. May repeat in 2 hours if headache persists or recurs. 06/04/17   [name], MD  tiZANidine (ZANAFLEX) 4 MG tablet Take 1 tablet (4 mg total) by mouth every 6 (six) hours as needed for muscle spasms. 06/04/17   [name], MD  topiramate (TOPAMAX) 100 MG tablet Take 1 tablet (100 mg total) by mouth 2 (two) times daily. 06/04/17   [name], MD  triamcinolone cream (KENALOG) 0.1 % Apply to the skin neck down 1-2 times a day as needed  for rash 09/25/17   [provider]    Family History Family History  Problem Relation Age of Onset  . Drug abuse Mother   . Hypertension Father   . Hypertension Maternal Grandmother   . Hypertension Maternal Grandfather   . Hypertension Paternal Grandmother     Social History Social History   Tobacco Use  . Smoking status: Current Some Day Smoker    Packs/day: 0.25    Types: Cigars    Last attempt to quit: 01/06/2016    Years since quitting: 1.9  . Smokeless tobacco: Never Used  Substance Use Topics  . Alcohol use: Yes    Comment: occasional  . Drug use: No     Allergies   Diphenhydramine hcl; Orange fruit; and Latex   Review of Systems Review of Systems  Constitutional: Negative for fever.  HENT: Positive for dental problem. Negative for facial swelling and sore  throat.   Respiratory: Negative for shortness of breath.   Musculoskeletal: Negative for neck pain and neck stiffness.     Physical Exam Updated Vital Signs BP (!) 147/97 (BP Location: Right Arm)   Pulse 78   Temp 98.8 F (37.1 C) (Oral)   Resp 18   Ht 5' (1.524 m)   Wt 74.4 kg   LMP 11/19/2017   SpO2 99%   BMI 32.03 kg/m   Physical Exam  Constitutional: She is oriented to person, place, and time. She appears well-developed and well-nourished. No distress.  HENT:  Head: Normocephalic and atraumatic.  Right Ear: Tympanic membrane, external ear and ear canal normal.  Left Ear: Tympanic membrane, external ear and ear canal normal.  Nose: Nose normal.  Mouth/Throat: Oropharynx is clear and moist and mucous membranes are normal. No oral lesions. No trismus in the jaw. Abnormal dentition. Dental caries present. No dental abscesses.    Sublingual space is soft.  Eyes: Conjunctivae are normal.  Neck: Normal range of motion. Neck supple.  Cardiovascular: Normal rate and normal heart sounds.  Pulmonary/Chest: Effort normal.  Abdominal: She exhibits no distension.  Musculoskeletal: Normal  range of motion.  Lymphadenopathy:    She has no cervical adenopathy.  Neurological: She is alert and oriented to person, place, and time.  Skin: Skin is warm and dry. No erythema.  Psychiatric: She has a normal mood and affect.     ED Treatments / Results  Labs (all labs ordered are listed, but only abnormal results are displayed) Labs Reviewed - No data to display  EKG None  Radiology No results found.  Procedures Procedures (including critical care time)  Medications Ordered in ED Medications  amoxicillin (AMOXIL) capsule 500 mg (has no administration in time range)  HYDROcodone-acetaminophen (NORCO/VICODIN) 5-325 MG per tablet 1 tablet (has no administration in time range)     Initial Impression / Assessment and Plan / ED Course  I have reviewed the triage vital signs and the nursing notes.  Pertinent labs & imaging results that were available during my care of the patient were reviewed by me and considered in my medical decision making (see chart for details).     Patient was placed on amoxicillin, hydrocodone prescribed for pain relief.  Advised to DC ibuprofen since this can elevate her blood pressure.  Advised follow-up with her dentist as planned.  Return precautions discussed.  Patient has no trismus, oropharynx is patent.  Final Clinical Impressions(s) / ED Diagnoses   Final diagnoses:  Dental abscess    ED Discharge Orders         Ordered    amoxicillin (AMOXIL) 500 MG capsule  3 times daily     12/10/17 1328    HYDROcodone-acetaminophen (NORCO/VICODIN) 5-325 MG tablet  Every 4 hours PRN     12/10/17 1328           Evalee Jefferson, PA-C 12/10/17 1334    Davonna Belling, MD 12/15/17 0004

## 2017-12-10 NOTE — ED Triage Notes (Addendum)
Patient complaining of pain and swelling to upper left jaw since yesterday. States she has a bad wisdom tooth to that area.

## 2017-12-11 NOTE — Telephone Encounter (Signed)
FMLA completed and signed.  To MR.

## 2017-12-17 ENCOUNTER — Encounter: Payer: Self-pay | Admitting: Nurse Practitioner

## 2017-12-18 ENCOUNTER — Telehealth: Payer: Self-pay | Admitting: *Deleted

## 2017-12-18 NOTE — Telephone Encounter (Signed)
LMVM for Rebecca Boyd to return call.  LMVM for pt to return call as well.

## 2017-12-18 NOTE — Telephone Encounter (Signed)
Spoke to pt, and asked her about the fmla ?, and she will have HR call me back.

## 2017-12-18 NOTE — Telephone Encounter (Signed)
LMVM for Rebecca Boyd in HR for FMLA p/w.  Has questions relating to way it was filled out. Pt out of work until no restrictions Full duty note sent to her.  Also needing times when pt checked in and left appt.  Check in 1547 check out 1428 on 12-03-17 with CM/NP.

## 2017-12-19 ENCOUNTER — Telehealth: Payer: Self-pay

## 2017-12-19 NOTE — Telephone Encounter (Signed)
LMVM for Rebecca Boyd at [employer] (HR) to return call about her questions concerning the FMLA.  This is my 3rd message and I will wait to hear back from her prior to filling out any other forms or answering questions.  (this was relayed on message).  If do not hear then will think they have what they needed.

## 2017-12-19 NOTE — Telephone Encounter (Signed)
[facility] - Phine just rang and unable to leave a voicemail message.

## 2017-12-26 NOTE — Telephone Encounter (Signed)
Pt called back asking about note, to be able to return to work.  I relayed to her that I have not spoken to her HR and had left 3 messages to return call.  She was able to walk to Ms Apple and I was able to speak to her.  She explained that the way the FMLA form was filled out on that particular # it was like she could not work.   She stated that it had to state for a migraine she could not do her duties.  Needed note was check in and check out times, and able to return to work without restrictions from her visit 12-03-17.  Also checked off that she may have to leave work or come in late if migraine.  Needs signatures and dates at addendums.

## 2017-12-26 NOTE — Telephone Encounter (Signed)
Addended form, signed and dated.  Faxed to Ms. Apple in HR [phone].  Fax confirmation received.

## 2017-12-28 ENCOUNTER — Telehealth: Payer: Self-pay

## 2017-12-28 NOTE — Telephone Encounter (Signed)
Several attempts have been made to contact patient without success. Patient will be placed on the inactive list.  If services are needed again.  Please contact [facility] at [phone].    Information will be routed to the PCP and Dr. Hisada  

## 2018-01-01 ENCOUNTER — Telehealth: Payer: Self-pay | Admitting: Family Medicine

## 2018-01-01 ENCOUNTER — Other Ambulatory Visit: Payer: Self-pay | Admitting: Family Medicine

## 2018-01-01 MED ORDER — AMLODIPINE BESYLATE 2.5 MG PO TABS: 2.5000 mg | ORAL_TABLET | Freq: Every day | ORAL | 3 refills | Status: DC

## 2018-01-01 NOTE — Telephone Encounter (Signed)
I SENT AMLODIPINE TO [facility]

## 2018-01-01 NOTE — Telephone Encounter (Signed)
Pt needs BP pills sent to Pharmacy --per her VM

## 2018-01-01 NOTE — Progress Notes (Signed)
AMLODIPINE

## 2018-01-02 ENCOUNTER — Telehealth: Payer: Self-pay | Admitting: Nurse Practitioner

## 2018-01-02 NOTE — Telephone Encounter (Signed)
I contacted the patient and advised of NP's recommendation. She was agreeable to this and stated she could come in tomorrow for an infusion.  I advised infusion nurse Otila Kluver, has left for the evening. I advised the patient I will discuss with Otila Kluver a good time to come in for an infusion and call back her back tomorrow. She was agreeable and had no further questions.  MB RN.

## 2018-01-02 NOTE — Telephone Encounter (Signed)
Pt is asking for a call re: a headache she has had for 2 days, please call

## 2018-01-02 NOTE — Telephone Encounter (Signed)
I contacted the patient. She stated 2 days ago she woke up with a headache that has gotten progressively worse. She states she has been experiencing light sensitivity, nausea and a small nose bleed (this happened yesterday none today). Patient reports no numbness or tingling. Patient states her head just feels sore. Patient confirmed she has been taking her Maxalt, Topamax, Imitrex and Pamelor as prescribed. Patient states she has had some relief, but once she wakes from sleeping her migraine has returned. Patient states she was not exposed to any particular headache triggers on Monday when her headache originally started. Patient wanted to know if a medication adjustment could be made.

## 2018-01-02 NOTE — Telephone Encounter (Signed)
She can come in tomorrow for Depacon infusion .  You will need to check with the IV TINA tomorrow and see if she can be worked in.  She can get 500 mg to 1 g Depacon IV which should relieve her migraine if that is what it is .

## 2018-01-03 ENCOUNTER — Encounter: Payer: Self-pay | Admitting: *Deleted

## 2018-01-03 DIAGNOSIS — G43709 Chronic migraine without aura, not intractable, without status migrainosus: Secondary | ICD-10-CM | POA: Diagnosis not present

## 2018-01-03 NOTE — Telephone Encounter (Signed)
Signed order from NP given to infusion nurse Otila Kluver.

## 2018-01-03 NOTE — Telephone Encounter (Signed)
I contacted the patient back this morning and advised the Infusion nurse Otila Kluver had a opening at 1:30 2:30 and 3:30. Patient was agreeable to coming in at 3:30. Order has been completed and once NP signs, order will be given to [place]. Patient had no further questions/concerns at this time. MB RN.

## 2018-01-10 ENCOUNTER — Other Ambulatory Visit: Payer: Self-pay | Admitting: Family Medicine

## 2018-01-10 ENCOUNTER — Ambulatory Visit (HOSPITAL_COMMUNITY): Payer: Self-pay | Admitting: Psychiatry

## 2018-02-04 ENCOUNTER — Emergency Department (HOSPITAL_COMMUNITY)
Admission: EM | Admit: 2018-02-04 | Discharge: 2018-02-04 | Disposition: A | Discharge status: Home / Self Care | Payer: BLUE CROSS/BLUE SHIELD | Attending: Emergency Medicine | Admitting: Emergency Medicine

## 2018-02-04 ENCOUNTER — Other Ambulatory Visit: Payer: Self-pay

## 2018-02-04 ENCOUNTER — Emergency Department (HOSPITAL_COMMUNITY): Payer: BLUE CROSS/BLUE SHIELD

## 2018-02-04 ENCOUNTER — Encounter (HOSPITAL_COMMUNITY): Payer: Self-pay

## 2018-02-04 DIAGNOSIS — J4520 Mild intermittent asthma, uncomplicated: Secondary | ICD-10-CM | POA: Diagnosis not present

## 2018-02-04 DIAGNOSIS — R51 Headache: Secondary | ICD-10-CM | POA: Insufficient documentation

## 2018-02-04 DIAGNOSIS — F17290 Nicotine dependence, other tobacco product, uncomplicated: Secondary | ICD-10-CM | POA: Insufficient documentation

## 2018-02-04 DIAGNOSIS — Z79899 Other long term (current) drug therapy: Secondary | ICD-10-CM | POA: Diagnosis not present

## 2018-02-04 DIAGNOSIS — I10 Essential (primary) hypertension: Secondary | ICD-10-CM | POA: Diagnosis not present

## 2018-02-04 DIAGNOSIS — R079 Chest pain, unspecified: Secondary | ICD-10-CM

## 2018-02-04 DIAGNOSIS — R0789 Other chest pain: Secondary | ICD-10-CM | POA: Diagnosis not present

## 2018-02-04 LAB — CBC WITH DIFFERENTIAL/PLATELET
BASOS ABS: 0 10*3/uL (ref 0.0–0.1)
BASOS PCT: 0 %
EOS ABS: 0.1 10*3/uL (ref 0.0–0.7)
EOS PCT: 1 %
HCT: 40.7 % (ref 36.0–46.0)
HEMOGLOBIN: 13.9 g/dL (ref 12.0–15.0)
LYMPHS ABS: 1.3 10*3/uL (ref 0.7–4.0)
Lymphocytes Relative: 24 %
MCH: 30.4 pg (ref 26.0–34.0)
MCHC: 34.2 g/dL (ref 30.0–36.0)
MCV: 89.1 fL (ref 78.0–100.0)
Monocytes Absolute: 0.3 10*3/uL (ref 0.1–1.0)
Monocytes Relative: 5 %
NEUTROS PCT: 70 %
Neutro Abs: 3.8 10*3/uL (ref 1.7–7.7)
PLATELETS: 162 10*3/uL (ref 150–400)
RBC: 4.57 MIL/uL (ref 3.87–5.11)
RDW: 12.9 % (ref 11.5–15.5)
WBC: 5.4 10*3/uL (ref 4.0–10.5)

## 2018-02-04 LAB — BASIC METABOLIC PANEL
ANION GAP: 8 (ref 5–15)
BUN: 7 mg/dL (ref 6–20)
CHLORIDE: 106 mmol/L (ref 98–111)
CO2: 25 mmol/L (ref 22–32)
Calcium: 9.2 mg/dL (ref 8.9–10.3)
Creatinine, Ser: 0.8 mg/dL (ref 0.44–1.00)
Glucose, Bld: 105 mg/dL — ABNORMAL HIGH (ref 70–99)
Potassium: 3.1 mmol/L — ABNORMAL LOW (ref 3.5–5.1)
SODIUM: 139 mmol/L (ref 135–145)

## 2018-02-04 LAB — TROPONIN I

## 2018-02-04 MED ORDER — MORPHINE SULFATE (PF) 4 MG/ML IV SOLN
4.0000 mg | Freq: Once | INTRAVENOUS | Status: AC
  Administered 2018-02-04: 4 mg via INTRAVENOUS
  Filled 2018-02-04: qty 1

## 2018-02-04 MED ORDER — SODIUM CHLORIDE 0.9 % IV BOLUS
500.0000 mL | Freq: Once | INTRAVENOUS | Status: AC
  Administered 2018-02-04: 500 mL via INTRAVENOUS

## 2018-02-04 MED ORDER — ONDANSETRON HCL 4 MG/2ML IJ SOLN
4.0000 mg | Freq: Once | INTRAMUSCULAR | Status: AC
  Administered 2018-02-04: 4 mg via INTRAVENOUS
  Filled 2018-02-04: qty 2

## 2018-02-04 MED ORDER — KETOROLAC TROMETHAMINE 30 MG/ML IJ SOLN
30.0000 mg | Freq: Once | INTRAMUSCULAR | Status: AC
  Administered 2018-02-04: 30 mg via INTRAVENOUS
  Filled 2018-02-04: qty 1

## 2018-02-04 NOTE — ED Provider Notes (Signed)
[facility] EMERGENCY DEPARTMENT Provider Note   CSN: [id number] Arrival date & time: 02/04/18  9767     History   Chief Complaint Chief Complaint  Patient presents with  . Chest Pain  . Headache    HPI Rebecca Boyd is a 42 y.o. female.  Patient reports left-sided chest pain with radiation to the left shoulder at approximately 815 this morning.  She reports a prodromal headache.  No dyspnea, diaphoresis, nausea.  Past medical history includes hypertension but no diabetes or cigarette smoking.  Unknown cholesterol level.  Family history negative for early MI.  No prolonged travel or immobilization recently.  She tried ibuprofen 200 mg with minimal success.  Severity of pain is moderate.     Past Medical History:  Diagnosis Date  . Anemia   . Anxiety   . Depressive disorder, not elsewhere classified   . Hypertension   . Migraine   . Migraines   . Miscarriage 2009  . Neck pain   . Vaginitis    cyctitis    Patient Active Problem List   Diagnosis Date Noted  . Abnormal vaginal bleeding 12/02/2017  . Major depression with psychotic features ([place]) 12/02/2017  . Rash and nonspecific skin eruption 05/14/2017  . Chronic migraine 12/06/2016  . Encounter for chronic pain management 05/10/2016  . Headache 03/16/2016  . Annual physical exam 11/11/2015  . Obesity (BMI 30.0-34.9) 03/17/2015  . Back pain with radiation 08/30/2014  . Essential hypertension, benign 01/07/2014  . Cervical neck pain with evidence of disc disease 10/02/2013  . Low back pain with radiation 07/30/2013  . 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 Asthma, mild intermittent 11/14/2012  . Vitamin D deficiency 10/27/2010  . Anxiety 10/26/2010    Past Surgical History:  Procedure Laterality Date  . cervical cryotherapy N/A 1999  . DILITATION & CURRETTAGE/HYSTROSCOPY WITH THERMACHOICE ABLATION  08/26/2013   Procedure: DILATATION & CURETTAGE/HYSTEROSCOPY WITH THERMACHOICE ENDOMETRIAL ABLATION Procedure #2  Total Therapy Time=min       sec;  Surgeon: Jonnie Kind, MD;  Location: [facility];  Service: Gynecology;;  . LAPAROSCOPIC BILATERAL SALPINGECTOMY Bilateral 08/26/2013   Procedure: LAPAROSCOPIC BILATERAL SALPINGECTOMY AND REMOVAL OF LEFT PERITUBAL CYST Procedure #1;  Surgeon: Jonnie Kind, MD;  Location: [facility];  Service: Gynecology;  Laterality: Bilateral;  . LAPAROSCOPIC LYSIS OF ADHESIONS  08/26/2013   Procedure: LAPAROSCOPIC LYSIS OF ADHESIONS Procedure #1;  Surgeon: Jonnie Kind, MD;  Location: [facility];  Service: Gynecology;;  . Carmelia Bake LESION REMOVAL  08/26/2013   Procedure: REMOVAL OF VULVAR SEBACEOUS CYST Procedure #3;  Surgeon: Jonnie Kind, MD;  Location: [facility];  Service: Gynecology;;     OB History   None      Home Medications    Prior to Admission medications   Medication Sig Start Date End Date Taking? Authorizing Provider  ALPRAZolam Duanne Moron) 1 MG tablet TAKE 1 TABLET BY MOUTH AT BEDTIME AS NEEDED FOR ANXIETY. 07/20/17  Yes Fayrene Helper, MD  amLODipine (NORVASC) 2.5 MG tablet TAKE 1 TABLET BY MOUTH ONCE DAILY 07/02/17  Yes Fayrene Helper, MD  cyclobenzaprine (FLEXERIL) 10 MG tablet One tablet at bedtime for back pain and spasm as needed 08/23/17  Yes Fayrene Helper, MD  fluticasone [facility]) 50 MCG/ACT nasal spray Place 2 sprays into both nostrils daily. Patient taking differently: Place 2 sprays into both nostrils daily as needed for allergies.  08/10/16  Yes Fayrene Helper, MD  oxyCODONE-acetaminophen (PERCOCET) 5-325 MG tablet Take one  tablet daily by mouth at bedtime for chronic back pain 01/26/18 02/26/18 Yes Fayrene Helper, MD  PROAIR HFA 108 [phone] Base) MCG/ACT inhaler INHALE 2 PUFFS INTO THE LUNGS EVERY 6 HOURS AS NEEDED FOR SHORTNESS OF BREATH OR WHEEZING. 10/09/17  Yes Fayrene Helper, MD  rizatriptan (MAXALT-MLT) 10 MG disintegrating tablet Take 1 tablet (10 mg total) by mouth as needed. May repeat in 2 hours if needed 06/04/17  Yes [name], MD  SUMAtriptan (IMITREX) 6 MG/0.5ML SOLN injection Inject 0.5 mLs (6 mg total) into the skin every 2 (two) hours as needed for migraine or headache. May repeat in 2 hours if headache persists or recurs. 06/04/17  Yes [name], MD  tiZANidine (ZANAFLEX) 4 MG tablet Take 1 tablet (4 mg total) by mouth every 6 (six) hours as needed for muscle spasms. 06/04/17  Yes [name], MD  topiramate (TOPAMAX) 100 MG tablet Take 1 tablet (100 mg total) by mouth 2 (two) times daily. 06/04/17  Yes [name], MD  triamcinolone cream (KENALOG) 0.1 % Apply to the skin neck down 1-2 times a day as needed for rash 09/25/17  Yes [provider]  ALPRAZolam Duanne Moron) 1 MG tablet One tablet at bedtime, for anxiety. Patient not taking: Reported on 02/04/2018 08/24/17   Fayrene Helper, MD  amLODipine (NORVASC) 2.5 MG tablet Take 1 tablet (2.5 mg total) by mouth daily. Patient not taking: Reported on 02/04/2018 01/01/18   Fayrene Helper, MD  amLODipine (NORVASC) 2.5 MG tablet TAKE 1 TABLET BY MOUTH ONCE DAILY Patient not taking: Reported on 02/04/2018 01/10/18   Fayrene Helper, MD  fluconazole (DIFLUCAN) 150 MG tablet Take one tablet once for one dose, on completion of 7 day antibiotic course , for vaginal itch from yeast Patient not taking: Reported on 02/04/2018 12/07/17   Fayrene Helper, MD  HYDROcodone-acetaminophen (NORCO/VICODIN) 5-325 MG tablet Take 1 tablet by mouth every 4 (four) hours as needed. Patient not taking: Reported on 02/04/2018 12/10/17   Evalee Jefferson, PA-C  meloxicam (MOBIC) 15 MG tablet Take 1 tablet (15 mg total) by mouth daily. Patient not taking: Reported on 02/04/2018 10/30/17   Landis Martins, DPM  nortriptyline (PAMELOR) 10 MG capsule Take 2 capsules (20 mg total) by mouth at bedtime. Patient not taking: Reported on 02/04/2018 06/04/17   [name], MD    Family History Family History  Problem Relation Age of Onset  . Drug abuse Mother   . Hypertension Father   . Hypertension  Maternal Grandmother   . Hypertension Maternal Grandfather   . Hypertension Paternal Grandmother     Social History Social History   Tobacco Use  . Smoking status: Current Some Day Smoker    Packs/day: 0.25    Types: Cigars    Last attempt to quit: 01/06/2016    Years since quitting: 2.0  . Smokeless tobacco: Never Used  Substance Use Topics  . Alcohol use: Yes    Comment: occasional  . Drug use: No     Allergies   Diphenhydramine hcl; Orange fruit; and Latex   Review of Systems Review of Systems  All other systems reviewed and are negative.    Physical Exam Updated Vital Signs BP 123/84   Pulse 66   Temp 98.4 F (36.9 C) (Oral)   Resp 18   Ht 5' (1.524 m)   Wt 70.8 kg   SpO2 99%   BMI 30.47 kg/m   Physical Exam  Constitutional: She is oriented to  person, place, and time. She appears well-developed and well-nourished.  HENT:  Head: Normocephalic and atraumatic.  Eyes: Conjunctivae are normal.  Neck: Neck supple.  Cardiovascular: Normal rate and regular rhythm.  Tender to palpation left chest wall.  Pulmonary/Chest: Effort normal and breath sounds normal.  Abdominal: Soft. Bowel sounds are normal.  Musculoskeletal: Normal range of motion.  Neurological: She is alert and oriented to person, place, and time.  Skin: Skin is warm and dry.  Psychiatric: She has a normal mood and affect. Her behavior is normal.  Nursing note and vitals reviewed.    ED Treatments / Results  Labs (all labs ordered are listed, but only abnormal results are displayed) Labs Reviewed  BASIC METABOLIC PANEL - Abnormal; Notable for the following components:      Result Value   Potassium 3.1 (*)    Glucose, Bld 105 (*)    All other components within normal limits  CBC WITH DIFFERENTIAL/PLATELET  TROPONIN I  TROPONIN I    EKG EKG Interpretation  Date/Time:  Monday February 04 2018 09:18:48 EDT Ventricular Rate:  86 PR Interval:    QRS Duration: 81 QT Interval:  393 QTC  Calculation: 471 R Axis:   63 Text Interpretation:  Sinus rhythm Confirmed by Nat Christen [phone]) on 02/04/2018 9:38:19 AM   Radiology Dg Chest 2 View  Result Date: 02/04/2018 CLINICAL DATA:  Left side chest pain EXAM: CHEST - 2 VIEW COMPARISON:  08/13/2016 FINDINGS: Heart and mediastinal contours are within normal limits. No focal opacities or effusions. No acute bony abnormality. IMPRESSION: No active cardiopulmonary disease. Electronically Signed   By: Rolm Baptise M.D.   On: 02/04/2018 09:51    Procedures Procedures (including critical care time)  Medications Ordered in ED Medications  sodium chloride 0.9 % bolus 500 mL (0 mLs Intravenous Stopped 02/04/18 1110)  ketorolac (TORADOL) 30 MG/ML injection 30 mg (30 mg Intravenous Given 02/04/18 1009)  morphine 4 MG/ML injection 4 mg (4 mg Intravenous Given 02/04/18 1145)  ondansetron (ZOFRAN) injection 4 mg (4 mg Intravenous Given 02/04/18 1145)     Initial Impression / Assessment and Plan / ED Course  I have reviewed the triage vital signs and the nursing notes.  Pertinent labs & imaging results that were available during my care of the patient were reviewed by me and considered in my medical decision making (see chart for details).    Patient reports left-sided chest pain with radiation to the left shoulder this morning.  She is low risk for ACS or PE.  Screening tests including EKG, chest x-ray, troponin x2 neg.  Patient is hemodynamically stable at discharge.  Final Clinical Impressions(s) / ED Diagnoses   Final diagnoses:  Chest pain, unspecified type    ED Discharge Orders    None       Nat Christen, MD 02/04/18 1414

## 2018-02-04 NOTE — ED Triage Notes (Signed)
Pt reports woke up and felt normal at 5am.  Reports was getting ready for work and started having headache at 7am.  Approx around 0815 started having sharp chest pain that radiates to left shoulder and down left arm.  Reports left arm and hand feel numb.  Pt says pain comes and goes.

## 2018-02-04 NOTE — ED Triage Notes (Signed)
EDP at bedside  

## 2018-02-04 NOTE — Discharge Instructions (Addendum)
Test showed no evidence of a heart attack.  Rest today.  Take ibuprofen or Tylenol for pain.  Follow-up with your primary care doctor or return if worse.

## 2018-02-06 ENCOUNTER — Telehealth: Payer: Self-pay | Admitting: Nurse Practitioner

## 2018-02-06 ENCOUNTER — Encounter: Payer: Self-pay | Admitting: Neurology

## 2018-02-06 DIAGNOSIS — G43709 Chronic migraine without aura, not intractable, without status migrainosus: Secondary | ICD-10-CM | POA: Diagnosis not present

## 2018-02-06 NOTE — Telephone Encounter (Signed)
Pt called back.  She is having migraines every other week, duration 2-3 days she states.  This has been since September.  Level 8.5 today.  She states she had sharp intense pains with the migraine (whole head) pain, and nose bleeds sometimes.  She has been out of work since Monday.  She is taking her topiramate 100mg  BID, imitrex inj/ maxalt prn and her amitriptyline at night.  She states the triptans will help but migraine comes back.  Will be able to go to sleep but when wakes has migraine again.  She states had good result from infusion, and would like to come in.  Please advise.

## 2018-02-06 NOTE — Telephone Encounter (Signed)
Pt called stating that she is having a migraine every other week starting the end of September, stating that the increase of migraines has caused her to miss work. Requesting a call to discuss a medication or infusion to help maintain her migraines please advise.

## 2018-02-06 NOTE — Telephone Encounter (Signed)
LMVM for pt to return call about her migraines.

## 2018-02-06 NOTE — Telephone Encounter (Signed)
Okay to come in and get Depacon 1 g IV, and Solu-Medrol 500mg  IV

## 2018-02-06 NOTE — Telephone Encounter (Signed)
Spoke to pt and she will come in at 1300.  She does not need driver for these 2 medications. She verbalized udnerstanding.

## 2018-02-12 ENCOUNTER — Ambulatory Visit: Payer: Self-pay | Admitting: Family Medicine

## 2018-02-25 ENCOUNTER — Other Ambulatory Visit: Payer: Self-pay | Admitting: Family Medicine

## 2018-02-25 DIAGNOSIS — G8929 Other chronic pain: Secondary | ICD-10-CM

## 2018-02-26 ENCOUNTER — Telehealth: Payer: Self-pay | Admitting: Family Medicine

## 2018-02-26 ENCOUNTER — Other Ambulatory Visit: Payer: Self-pay | Admitting: Family Medicine

## 2018-02-26 NOTE — Telephone Encounter (Signed)
Pharmacy states they never received the 3rd rx for oxycodone to be filled 01/26/2018.

## 2018-02-26 NOTE — Telephone Encounter (Signed)
oxyCODONE-acetaminophen (PERCOCET) 5-325 MG tablet Pt is calling about her RX, Pharmacy states its denied

## 2018-02-26 NOTE — Telephone Encounter (Signed)
Based on the schedule the rx was sent to be filled on Oct 28 or after. Tell her to check them today to see if its able to be filled now

## 2018-02-28 ENCOUNTER — Encounter (HOSPITAL_COMMUNITY): Payer: Self-pay | Admitting: Psychiatry

## 2018-02-28 ENCOUNTER — Ambulatory Visit (INDEPENDENT_AMBULATORY_CARE_PROVIDER_SITE_OTHER): Payer: BLUE CROSS/BLUE SHIELD | Admitting: Family Medicine

## 2018-02-28 ENCOUNTER — Encounter: Payer: Self-pay | Admitting: Family Medicine

## 2018-02-28 ENCOUNTER — Ambulatory Visit (INDEPENDENT_AMBULATORY_CARE_PROVIDER_SITE_OTHER): Payer: BLUE CROSS/BLUE SHIELD | Admitting: Psychiatry

## 2018-02-28 VITALS — BP 123/84 | HR 73 | Ht 60.0 in | Wt 161.0 lb

## 2018-02-28 VITALS — BP 122/80 | HR 83 | Resp 16 | Ht 60.0 in | Wt 162.0 lb

## 2018-02-28 DIAGNOSIS — M5440 Lumbago with sciatica, unspecified side: Secondary | ICD-10-CM

## 2018-02-28 DIAGNOSIS — F331 Major depressive disorder, recurrent, moderate: Secondary | ICD-10-CM

## 2018-02-28 DIAGNOSIS — F323 Major depressive disorder, single episode, severe with psychotic features: Secondary | ICD-10-CM

## 2018-02-28 DIAGNOSIS — E669 Obesity, unspecified: Secondary | ICD-10-CM

## 2018-02-28 DIAGNOSIS — I10 Essential (primary) hypertension: Secondary | ICD-10-CM | POA: Diagnosis not present

## 2018-02-28 DIAGNOSIS — G8929 Other chronic pain: Secondary | ICD-10-CM

## 2018-02-28 DIAGNOSIS — M545 Low back pain, unspecified: Secondary | ICD-10-CM

## 2018-02-28 MED ORDER — OXYCODONE-ACETAMINOPHEN 5-325 MG PO TABS: ORAL_TABLET | ORAL | 0 refills | Status: AC

## 2018-02-28 MED ORDER — OXYCODONE-ACETAMINOPHEN 5-325 MG PO TABS: ORAL_TABLET | ORAL | 0 refills | Status: DC

## 2018-02-28 MED ORDER — FLUOXETINE HCL 20 MG PO CAPS: 20.0000 mg | ORAL_CAPSULE | Freq: Every day | ORAL | 2 refills | Status: DC

## 2018-02-28 MED ORDER — QUETIAPINE FUMARATE 25 MG PO TABS: 25.0000 mg | ORAL_TABLET | Freq: Every day | ORAL | 2 refills | Status: DC

## 2018-02-28 MED ORDER — ALPRAZOLAM 1 MG PO TABS: ORAL_TABLET | ORAL | 5 refills | Status: DC

## 2018-02-28 NOTE — Progress Notes (Signed)
Fill Date ID Written Drug Qty Days Prescriber Rx # Pharmacy Refill Daily Dose * Pymt Type PMP  01/26/2018  1   12/03/2017  Oxycodone-Acetaminophen 5-325  30.00 30 Ma Sim  [id number]  Car ([id number])  0/0 7.50 MME Comm Ins  [place]  01/15/2018  1   08/24/2017  Alprazolam 1 Mg Tablet  30.00 30 Ma Sim  [id number]  Car ([id number])  4/5 2.00 LME Comm Ins  [place]  12/27/2017  1   12/03/2017  Oxycodone-Acetaminophen 5-325  30.00 30 Ma Sim  [id number]  Car ([id number])  0/0 7.50 MME Comm Ins  [place]  12/10/2017  2   12/10/2017  Hydrocodone-Acetamin 5-325 Mg  15.00 3 Ju [place]  [id number]  [place] ([id number])  0/0 25.00 MME Comm Ins  [place]  11/26/2017  1   11/26/2017  Oxycodone-Acetaminophen 5-325  30.00 30 Ma Sim  [id number]  Car ([id number])  0/0 7.50 MME Comm Ins  [place]  11/09/2017  1   08/24/2017  Alprazolam 1 Mg Tablet  30.00 30 Ma Sim  [id number]  Car ([id number])  2/5 2.00 LME Comm Ins  [place]  10/24/2017  1   08/24/2017  Oxycodone-Acetaminophen 5-325  30.00 30 Ma Sim  [id number]  Car ([id number])  0/0 7.50 MME Comm Ins  [place]  10/13/2017  1   08/24/2017  Alprazolam 1 Mg Tablet  30.00 30 Ma Sim  [id number]  Car ([id number])  1/5 2.00 LME Comm Ins  [place]  09/25/2017  1   08/24/2017  Oxycodone-Acetaminophen 5-325  30.00 30 Ma Sim  [id number]  Car ([id number])  0/0 7.50 MME Comm Ins  [place]  09/10/2017  1   08/24/2017  Alprazolam 1 Mg Tablet  30.00 30 Ma Sim  [id number]  Car ([id number])  0/5 2.00 LME Comm Ins  [place]  08/24/2017  1   08/24/2017  Oxycodone-Acetaminophen 5-325  30.00 30 Ma Sim  [id number]  Car ([id number])  0/0
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
## 2018-02-28 NOTE — Patient Instructions (Signed)
F/u 3rd week in January, call if you need me before  Cong rats on no smoke  You are referred to Dr Merlene Laughter re numb great toes x 3 months  It is important that you exercise regularly at least 30 minutes 5 times a week. If you develop chest pain, have severe difficulty breathing, or feel very tired, stop exercising immediately and seek medical attention  Thank you  for choosing  Primary [facility]. We consider it a privelige to serve you.  Delivering excellent health care in a caring and  compassionate way is our goal.  Partnering with you,  so that together we can achieve this goal is our strategy.

## 2018-03-03 ENCOUNTER — Encounter: Payer: Self-pay | Admitting: Family Medicine

## 2018-03-03 MED ORDER — OXYCODONE-ACETAMINOPHEN 5-325 MG PO TABS: ORAL_TABLET | ORAL | 0 refills | Status: DC

## 2018-03-03 MED ORDER — OXYCODONE-ACETAMINOPHEN 5-325 MG PREPACK: ORAL_TABLET | ORAL | 0 refills | Status: DC

## 2018-03-03 NOTE — Assessment & Plan Note (Signed)
Unchanged and adequately controlled on current regime

## 2018-03-03 NOTE — Assessment & Plan Note (Signed)
Controlled, no change in medication DASH diet and commitment to daily physical activity for a minimum of 30 minutes discussed and encouraged, as a part of hypertension management. The importance of attaining a healthy weight is also discussed.  BP/Weight 02/28/2018 02/04/2018 12/10/2017 12/03/2017 11/27/2017 10/30/2017 0/27/2536  Systolic BP 644 034 742 595 638 756 433  Diastolic BP 80 83 98 295 86 89 86  Wt. (Lbs) 162 156 164 164.2 164 - 170  BMI 31.64 30.47 32.03 32.07 32.03 - 33.2  Some encounter information is confidential and restricted. Go to Review Flowsheets activity to see all data.

## 2018-03-03 NOTE — Progress Notes (Signed)
Rebecca Boyd     MRN: [id number]      DOB: [birth year]   HPI Rebecca Boyd is here for follow up and re-evaluation of chronic medical conditions, medication management and review of any available recent lab and radiology data.  Preventive health is updated, specifically  Cancer screening and Immunization.   Questions or concerns regarding consultations or procedures which the PT has had in the interim are  addressed. The PT denies any adverse reactions to current medications since the last visit.  There are no new concerns.  There are no specific complaints   ROS Denies recent fever or chills. Denies sinus pressure, nasal congestion, ear pain or sore throat. Denies chest congestion, productive cough or wheezing. Denies chest pains, palpitations and leg swelling Denies abdominal pain, nausea, vomiting,diarrhea or constipation.   Denies dysuria, frequency, hesitancy or incontinence. Denies joint pain, swelling and limitation in mobility. Denies headaches, seizures, numbness, or tingling. C/o  depression, anxiety or insomnia. Denies skin break down or rash.   PE  BP 122/80   Pulse 83   Resp 16   Ht 5' (1.524 m)   Wt 162 lb (73.5 kg)   SpO2 98%   BMI 31.64 kg/m   Patient alert and oriented and in no cardiopulmonary distress.  HEENT: No facial asymmetry, EOMI,   oropharynx pink and moist.  Neck supple no JVD, no mass.  Chest: Clear to auscultation bilaterally.  CVS: S1, S2 no murmurs, no S3.Regular rate.  ABD: Soft non tender.   Ext: No edema  MS: Adequate ROM spine, shoulders, hips and knees.  Skin: Intact, no ulcerations or rash noted.  Psych: Good eye contact, normal affect. Memory intact not anxious or depressed appearing.  CNS: CN 2-12 intact, power,  normal throughout.no focal deficits noted.   Assessment & Plan  Encounter for chronic pain management The patient's Controlled Substance registry is reviewed and compliance confirmed. Adequacy of  Pain  control and level of function is assessed. Medication dosing is adjusted as deemed appropriate. Twelve weeks of medication is prescribed , patient signs for the script and is provided with a follow up appointment between 11 to 12 weeks .   Essential hypertension, benign Controlled, no change in medication DASH diet and commitment to daily physical activity for a minimum of 30 minutes discussed and encouraged, as a part of hypertension management. The importance of attaining a healthy weight is also discussed.  BP/Weight 02/28/2018 02/04/2018 12/10/2017 12/03/2017 11/27/2017 10/30/2017 06/22/9796  Systolic BP 921 194 174 081 448 185 631  Diastolic BP 80 83 98 497 86 89 86  Wt. (Lbs) 162 156 164 164.2 164 - 170  BMI 31.64 30.47 32.03 32.07 32.03 - 33.2  Some encounter information is confidential and restricted. Go to Review Flowsheets activity to see all data.       Obesity (BMI 30.0-34.9) Deteriorated. Patient re-educated about  the importance of commitment to a  minimum of 150 minutes of exercise per week.  The importance of healthy food choices with portion control discussed. Encouraged to start a food diary, count calories and to consider  joining a support group. Sample diet sheets offered. Goals set by the patient for the next several months.   Weight /BMI 02/28/2018 02/04/2018 12/10/2017  WEIGHT 162 lb 156 lb 164 lb  HEIGHT 5\' 0"  5\' 0"  5\' 0"   BMI 31.64 kg/m2 30.47 kg/m2 32.03 kg/m2  Some encounter information is confidential and restricted. Go to Review Flowsheets activity to see all data.  Major depression with psychotic features [facility]) Has appointment to return to Psychiatry and will keep this, not currenly suicidal or homicidal, did report hallucinations in the past  Low back pain with radiation Unchanged and adequately controlled on current regime

## 2018-03-03 NOTE — Assessment & Plan Note (Signed)
Has appointment to return to Psychiatry and will keep this, not currenly suicidal or homicidal, did report hallucinations in the past

## 2018-03-03 NOTE — Assessment & Plan Note (Signed)
Deteriorated. Patient re-educated about  the importance of commitment to a  minimum of 150 minutes of exercise per week.  The importance of healthy food choices with portion control discussed. Encouraged to start a food diary, count calories and to consider  joining a support group. Sample diet sheets offered. Goals set by the patient for the next several months.   Weight /BMI 02/28/2018 02/04/2018 12/10/2017  WEIGHT 162 lb 156 lb 164 lb  HEIGHT 5\' 0"  5\' 0"  5\' 0"   BMI 31.64 kg/m2 30.47 kg/m2 32.03 kg/m2  Some encounter information is confidential and restricted. Go to Review Flowsheets activity to see all data.

## 2018-03-03 NOTE — Assessment & Plan Note (Signed)
The patient's Controlled Substance registry is reviewed and compliance confirmed. Adequacy of  Pain control and level of function is assessed. Medication dosing is adjusted as deemed appropriate. Twelve weeks of medication is prescribed , patient signs for the script and is provided with a follow up appointment between 11 to 12 weeks .  

## 2018-03-11 ENCOUNTER — Ambulatory Visit (INDEPENDENT_AMBULATORY_CARE_PROVIDER_SITE_OTHER): Payer: BLUE CROSS/BLUE SHIELD | Admitting: Family Medicine

## 2018-03-11 ENCOUNTER — Telehealth: Payer: Self-pay | Admitting: Nurse Practitioner

## 2018-03-11 ENCOUNTER — Encounter: Payer: Self-pay | Admitting: Family Medicine

## 2018-03-11 VITALS — BP 130/85 | HR 81 | Resp 12 | Ht 60.0 in | Wt 159.1 lb

## 2018-03-11 DIAGNOSIS — R21 Rash and other nonspecific skin eruption: Secondary | ICD-10-CM

## 2018-03-11 DIAGNOSIS — I10 Essential (primary) hypertension: Secondary | ICD-10-CM | POA: Diagnosis not present

## 2018-03-11 MED ORDER — CEPHALEXIN 500 MG PO CAPS: 500.0000 mg | ORAL_CAPSULE | Freq: Three times a day (TID) | ORAL | 0 refills | Status: DC

## 2018-03-11 MED ORDER — FLUCONAZOLE 150 MG PO TABS: ORAL_TABLET | ORAL | 0 refills | Status: DC

## 2018-03-11 NOTE — Telephone Encounter (Signed)
Pt called stating yesterday she noticed a migraine coming on so "took it easy", stating earlier today she realized a knot on the crown of her head and a burning sensation near it. Requesting a call to discuss what to do

## 2018-03-11 NOTE — Progress Notes (Signed)
   Rebecca Boyd     MRN: [id number]      DOB: 12/21/1975   HPI Rebecca Boyd is here with a lesion on her scalp on the crown, states she was supposed to have injection for headache today, but appt deferred until this is addressed. No recall of inciting trauma and does not use hair chemicals, however noted to repeatedly scratch the lesion Also new red pruritic area on right leg with possible puncture site Denies fever , chills or purulent drainage   ROS Denies recent fever or chills. Denies sinus pressure, nasal congestion, ear pain or sore throat. Denies chest congestion, productive cough or wheezing. Denies chest pains, palpitations and leg swelling Denies abdominal pain, nausea, vomiting,diarrhea or constipation.   Denies dysuria, frequency, hesitancy or incontinence. Denies joint pain, swelling and limitation in mobility. Denies headaches, seizures, numbness, or tingling. Denies uncontrolled  depression, anxiety or insomnia.    PE  BP 130/85   Pulse 81   Resp 12   Ht 5' (1.524 m)   Wt 159 lb 1.3 oz (72.2 kg)   SpO2 98% Comment: room air  BMI 31.07 kg/m   Patient alert and oriented and in no cardiopulmonary distress.  HEENT: No facial asymmetry, EOMI,   oropharynx pink and moist.  Neck supple no JVD, no mass.  Chest: Clear to auscultation bilaterally.  CVS: S1, S2 no murmurs, no S3.Regular rate.  ABD: Soft non tender.   Ext: No edema  MS: Adequate ROM spine, shoulders, hips and knees.  Skin: Intact,erythematous macular rash on  Right leg and scaly lesion on scalp half dime size with scab,   Psych: Good eye contact, normal affect. Memory intact not anxious or depressed appearing.  CNS: CN 2-12 intact, power,  normal throughout.no focal deficits noted.   Assessment & Plan  Rash and nonspecific skin eruption Erythematous macular rash on leg , [possinble puncture site, diameter approx 1.5 cm, also rash omn scalp with scab Antibiotic prescribed ,  keflex  Essential hypertension, benign Controlled, no change in medication DASH diet and commitment to daily physical activity for a minimum of 30 minutes discussed and encouraged, as a part of hypertension management. The importance of attaining a healthy weight is also discussed.  BP/Weight 03/11/2018 02/28/2018 02/04/2018 12/10/2017 12/03/2017 9/92/4268 07/02/1960  Systolic BP 229 798 921 194 174 081 448  Diastolic BP 85 80 83 98 185 86 89  Wt. (Lbs) 159.08 162 156 164 164.2 164 -  BMI 31.07 31.64 30.47 32.03 32.07 32.03 -  Some encounter information is confidential and restricted. Go to Review Flowsheets activity to see all data.

## 2018-03-11 NOTE — Assessment & Plan Note (Signed)
Erythematous macular rash on leg , [possinble puncture site, diameter approx 1.5 cm, also rash omn scalp with scab Antibiotic prescribed , keflex

## 2018-03-11 NOTE — Patient Instructions (Signed)
F/U as before, call if you need me sooner  One week of antibiotic is prescribed for skin infection   It is important that you exercise regularly at least 30 minutes 5 times a week. If you develop chest pain, have severe difficulty breathing, or feel very tired, stop exercising immediately and seek medical attention

## 2018-03-11 NOTE — Assessment & Plan Note (Signed)
Controlled, no change in medication DASH diet and commitment to daily physical activity for a minimum of 30 minutes discussed and encouraged, as a part of hypertension management. The importance of attaining a healthy weight is also discussed.  BP/Weight 03/11/2018 02/28/2018 02/04/2018 12/10/2017 12/03/2017 2/41/9914 08/03/5846  Systolic BP 350 757 322 567 209 198 022  Diastolic BP 85 80 83 98 179 86 89  Wt. (Lbs) 159.08 162 156 164 164.2 164 -  BMI 31.07 31.64 30.47 32.03 32.07 32.03 -  Some encounter information is confidential and restricted. Go to Review Flowsheets activity to see all data.

## 2018-03-11 NOTE — Telephone Encounter (Signed)
I called pt.  She has had a migraine yesterday and then today.  Has taken her rizatriptan odt which helped some but has come back. She is taking topamax, has imitrex injectible if needs (she had not taken yet).    She noted some dizziness, and knot on her crown (hard area), that has burning sensation since today.  I relayed not sure what that is.  Recommended pcp eval.  If her migraine continues to give [place] a call back.  She verbalized understanding.

## 2018-03-12 ENCOUNTER — Encounter: Payer: Self-pay | Admitting: Neurology

## 2018-03-12 DIAGNOSIS — G43709 Chronic migraine without aura, not intractable, without status migrainosus: Secondary | ICD-10-CM | POA: Diagnosis not present

## 2018-03-12 NOTE — Telephone Encounter (Signed)
Ok to come in for infusion,   Please make sure she is not pregnant  Depacon 500 mg x 2 Compazine 10mg  Toradol 30mg    Iv

## 2018-03-12 NOTE — Telephone Encounter (Signed)
I spoke with intrafusion 1030 or 1200 available.  Pt stated 1200.  She is not pregnant.  Dr. Krista Blue ordered depacon, toradol, and compazine IV as per below.  Pt will bring driver.  She verbalized understanding.

## 2018-03-12 NOTE — Telephone Encounter (Signed)
Pt calling back.  Still with migraine, 3rd day.  Taking her topamax and triptan, help some but come back.   Level 8 today.  Slight nausea, lights and sounds bothersome. Asking for infusion which she states has helped in past.   Does have driver. Please advise.  (saw her pcp yesterday for knot on crown) scabed lesion, on ABX.

## 2018-03-26 ENCOUNTER — Other Ambulatory Visit: Payer: Self-pay | Admitting: Family Medicine

## 2018-03-26 ENCOUNTER — Other Ambulatory Visit: Payer: Self-pay

## 2018-03-26 ENCOUNTER — Telehealth: Payer: Self-pay | Admitting: *Deleted

## 2018-03-26 MED ORDER — OXYCODONE-ACETAMINOPHEN 5-325 MG PO TABS: ORAL_TABLET | ORAL | 0 refills | Status: AC

## 2018-03-26 NOTE — Telephone Encounter (Signed)
pls review what was sent in her med list , call pharmacy ands sort out , I have looked at it and do not see a problem, pls correvct me if I am wromg

## 2018-03-26 NOTE — Telephone Encounter (Signed)
Pt states percocet needs to be refilled. Has December but did not have November. The pharmacy told her there were two request with December on it. Trying to take care of before she goes out of town tomorrow morning.

## 2018-03-26 NOTE — Progress Notes (Signed)
Percocet 5

## 2018-03-27 NOTE — Telephone Encounter (Signed)
November script resent by Dr.Simpson.Blanch Media unable to get patient back on the phone

## 2018-04-03 ENCOUNTER — Ambulatory Visit (HOSPITAL_COMMUNITY): Payer: BLUE CROSS/BLUE SHIELD | Admitting: Psychiatry

## 2018-04-16 IMAGING — DX DG CHEST 2V
2 series · 2 of 2 positions shown · non-contrast
Comparison: 05/10/2016

CLINICAL DATA: Chest pain and shortness of Breath

EXAM:
CHEST  2 VIEW

[chest pa]
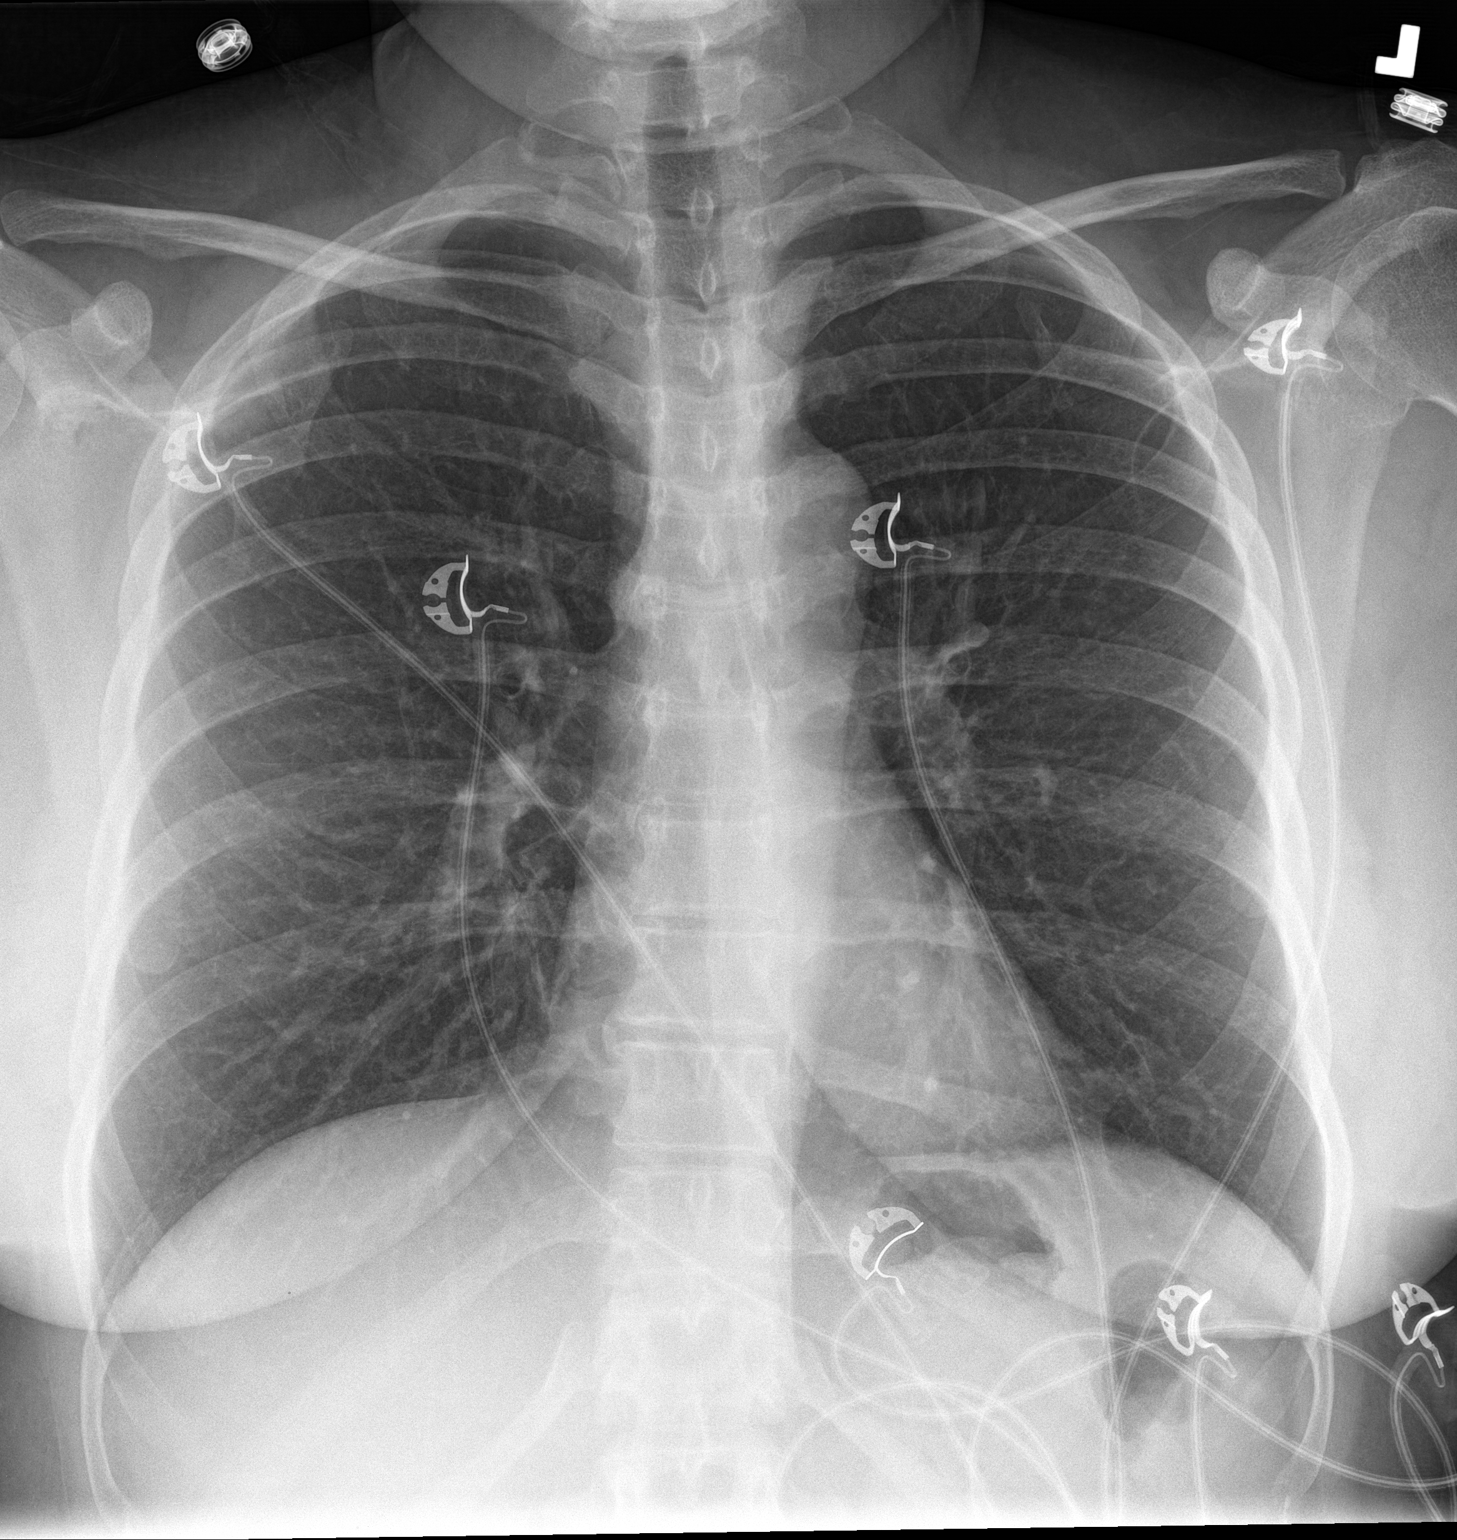

[chest lat]
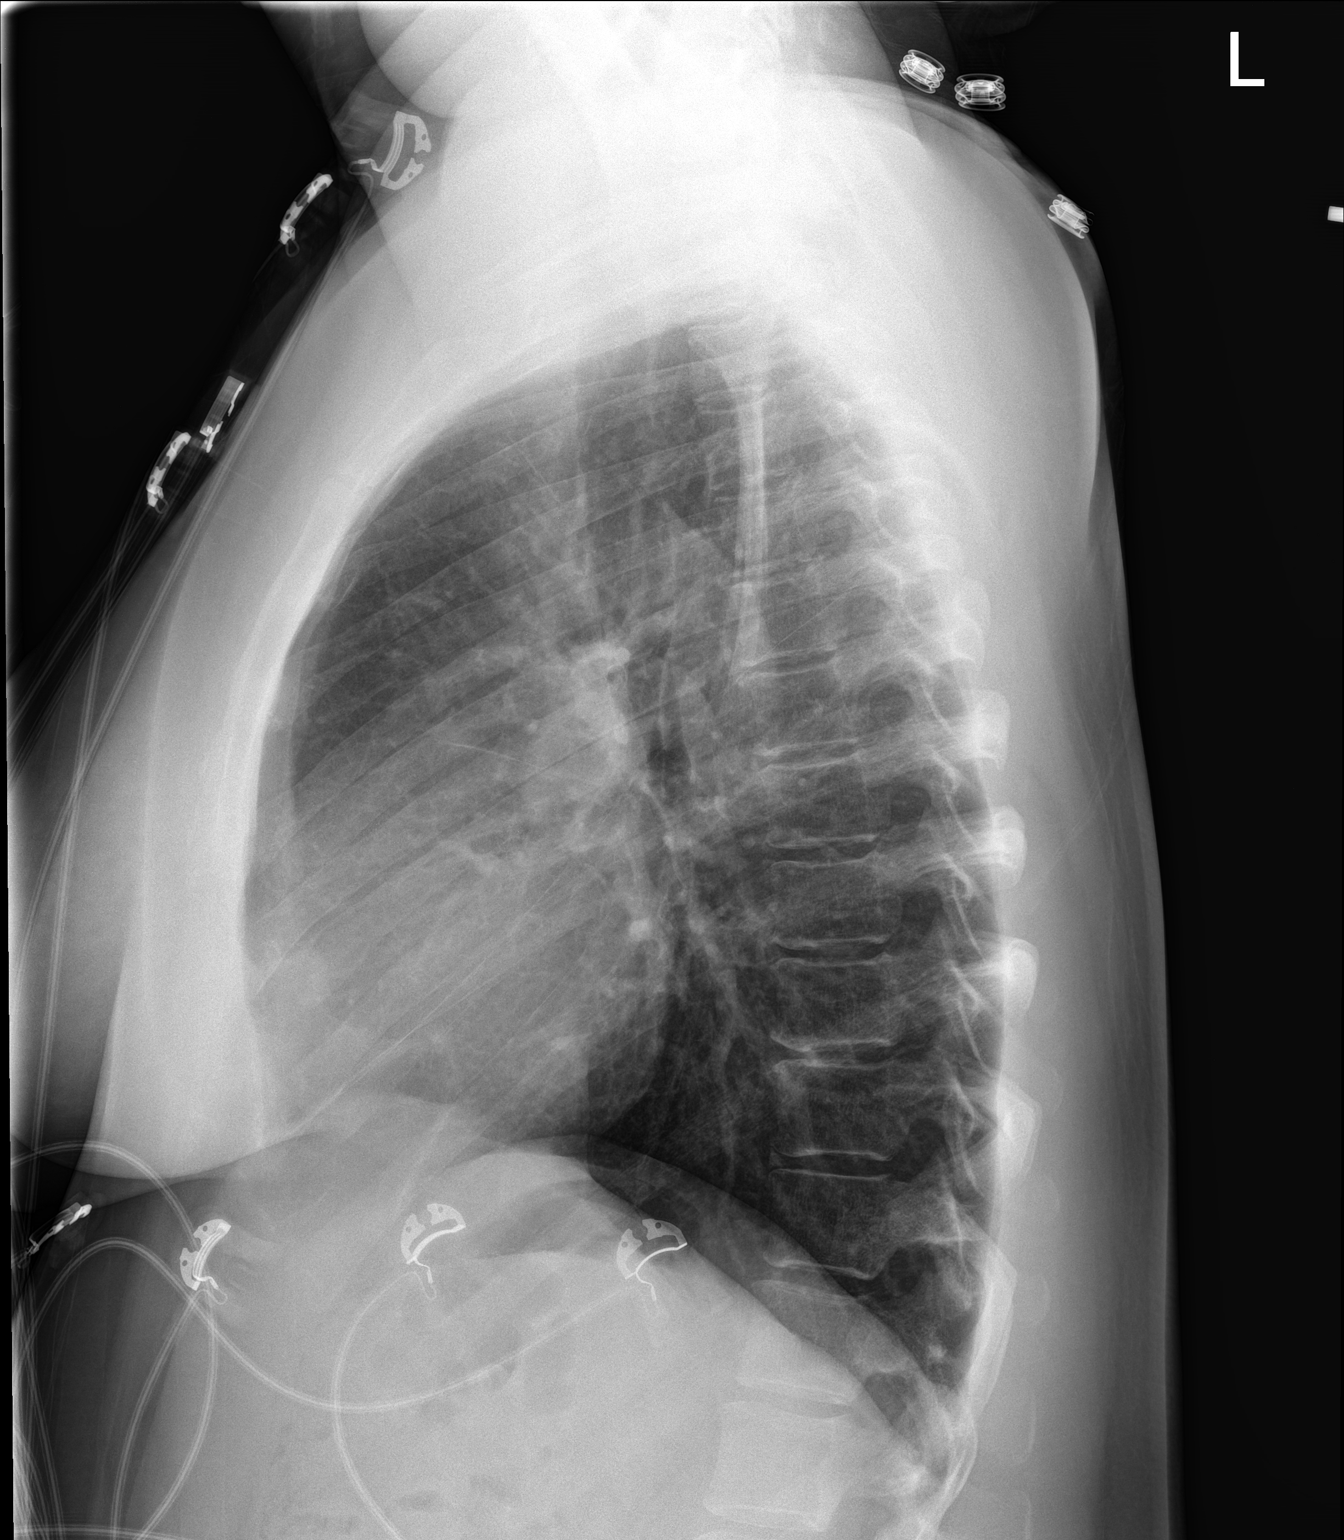

[2 of 2 positions shown; findings below may reference images not displayed]

FINDINGS: The heart size and mediastinal contours are within normal limits.
Both lungs are clear. The visualized skeletal structures are
unremarkable.
IMPRESSION: No active cardiopulmonary disease.

## 2018-05-14 ENCOUNTER — Ambulatory Visit (INDEPENDENT_AMBULATORY_CARE_PROVIDER_SITE_OTHER): Payer: BLUE CROSS/BLUE SHIELD | Admitting: Family Medicine

## 2018-05-14 ENCOUNTER — Encounter: Payer: Self-pay | Admitting: Family Medicine

## 2018-05-14 ENCOUNTER — Other Ambulatory Visit (HOSPITAL_COMMUNITY)
Admission: RE | Admit: 2018-05-14 | Discharge: 2018-05-14 | Disposition: A | Discharge status: Home / Self Care | Payer: BLUE CROSS/BLUE SHIELD | Source: Ambulatory Visit | Attending: Family Medicine | Admitting: Family Medicine

## 2018-05-14 VITALS — BP 126/84 | HR 83 | Resp 12 | Ht 60.0 in | Wt 159.1 lb

## 2018-05-14 DIAGNOSIS — G8929 Other chronic pain: Secondary | ICD-10-CM | POA: Diagnosis not present

## 2018-05-14 DIAGNOSIS — R52 Pain, unspecified: Secondary | ICD-10-CM

## 2018-05-14 DIAGNOSIS — E669 Obesity, unspecified: Secondary | ICD-10-CM

## 2018-05-14 DIAGNOSIS — I10 Essential (primary) hypertension: Secondary | ICD-10-CM | POA: Diagnosis not present

## 2018-05-14 DIAGNOSIS — E049 Nontoxic goiter, unspecified: Secondary | ICD-10-CM | POA: Diagnosis not present

## 2018-05-14 DIAGNOSIS — R7301 Impaired fasting glucose: Secondary | ICD-10-CM

## 2018-05-14 DIAGNOSIS — F323 Major depressive disorder, single episode, severe with psychotic features: Secondary | ICD-10-CM

## 2018-05-14 MED ORDER — ALBUTEROL SULFATE HFA 108 (90 BASE) MCG/ACT IN AERS: 2.0000 | INHALATION_SPRAY | Freq: Four times a day (QID) | RESPIRATORY_TRACT | 3 refills | Status: DC | PRN

## 2018-05-14 MED ORDER — ALPRAZOLAM 1 MG PO TABS: ORAL_TABLET | ORAL | 5 refills | Status: DC

## 2018-05-14 MED ORDER — OXYCODONE-ACETAMINOPHEN 5-325 MG PO TABS: ORAL_TABLET | ORAL | 0 refills | Status: AC

## 2018-05-14 NOTE — Patient Instructions (Addendum)
F/U in week of April 20, call if you need me before    Please work on "mindful eating" and change from sodas to water  You are referred for thyroid US due to goiter  Fasting lipid, cmp and EGFr, HBA1C and TSH 1 week before f/u  It is important that you exercise regularly at least 30 minutes 5 times a week. If you develop chest pain, have severe difficulty breathing, or feel very tired, stop exercising immediately and seek medical attention

## 2018-05-15 LAB — MISC LABCORP TEST (SEND OUT): Labcorp test code: 733692

## 2018-05-16 ENCOUNTER — Ambulatory Visit: Payer: BLUE CROSS/BLUE SHIELD | Admitting: Family Medicine

## 2018-05-17 ENCOUNTER — Ambulatory Visit (HOSPITAL_COMMUNITY)
Admission: RE | Admit: 2018-05-17 | Discharge: 2018-05-17 | Disposition: A | Discharge status: Home / Self Care | Payer: BLUE CROSS/BLUE SHIELD | Source: Ambulatory Visit | Attending: Family Medicine | Admitting: Family Medicine

## 2018-05-17 DIAGNOSIS — E049 Nontoxic goiter, unspecified: Secondary | ICD-10-CM | POA: Diagnosis not present

## 2018-05-17 DIAGNOSIS — E042 Nontoxic multinodular goiter: Secondary | ICD-10-CM | POA: Diagnosis not present

## 2018-05-19 ENCOUNTER — Encounter: Payer: Self-pay | Admitting: Family Medicine

## 2018-05-19 NOTE — Assessment & Plan Note (Signed)
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

## 2018-05-19 NOTE — Progress Notes (Signed)
Rebecca Boyd     MRN: [id number]      DOB: 05-17-1975   HPI Rebecca Boyd is here for follow up and re-evaluation of chronic medical conditions, medication management and review of any available recent lab and radiology data.  Preventive health is updated, specifically  Cancer screening and Immunization.   Being treated by Psychiatry and there is gradual improvement The PT denies any adverse reactions to current medications since the last visit.  There are no new concerns.  There are no specific complaints   ROS Denies recent fever or chills. Denies sinus pressure, nasal congestion, ear pain or sore throat. Denies chest congestion, productive cough or wheezing. Denies chest pains, palpitations and leg swelling Denies abdominal pain, nausea, vomiting,diarrhea or constipation.   Denies dysuria, frequency, hesitancy or incontinence. Denies uncontrolled  joint pain, swelling and limitation in mobility. Denies headaches, seizures, numbness, or tingling. Denies depression, anxiety or insomnia. Denies skin break down or rash.   PE  BP 126/84 (BP Location: Left Arm, Patient Position: Sitting, Cuff Size: Normal)   Pulse 83   Resp 12   Ht 5' (1.524 m)   Wt 159 lb 1.9 oz (72.2 kg)   SpO2 99% Comment: room air  BMI 31.08 kg/m   Patient alert and oriented and in no cardiopulmonary distress.  HEENT: No facial asymmetry, EOMI,   oropharynx pink and moist.  Neck supple no JVD,goiter with nodule left.  Chest: Clear to auscultation bilaterally.  CVS: S1, S2 no murmurs, no S3.Regular rate.  ABD: Soft non tender.   Ext: No edema  MS: Adequate ROM spine, shoulders, hips and knees.  Skin: Intact, no ulcerations or rash noted.  Psych: Good eye contact, normal affect. Memory intact not anxious or depressed appearing.  CNS: CN 2-12 intact, power,  normal throughout.no focal deficits noted.   Assessment & Plan  Encounter for chronic pain management The patient's Controlled  Substance registry is reviewed and compliance confirmed. Adequacy of  Pain control and level of function is assessed. Medication dosing is adjusted as deemed appropriate. Twelve weeks of medication is prescribed , patient signs for the script and is provided with a follow up appointment between 11 to 12 weeks .   Essential hypertension, benign Controlled, no change in medication DASH diet and commitment to daily physical activity for a minimum of 30 minutes discussed and encouraged, as a part of hypertension management. The importance of attaining a healthy weight is also discussed.  BP/Weight 05/14/2018 03/11/2018 02/28/2018 02/04/2018 12/10/2017 12/03/2017 08/14/6061  Systolic BP 016 010 932 355 732 202 542  Diastolic BP 84 85 80 83 98 110 86  Wt. (Lbs) 159.12 159.08 162 156 164 164.2 164  BMI 31.08 31.07 31.64 30.47 32.03 32.07 32.03  Some encounter information is confidential and restricted. Go to Review Flowsheets activity to see all data.       Obesity (BMI 30.0-34.9) unchanged Patient re-educated about  the importance of commitment to a  minimum of 150 minutes of exercise per week.  The importance of healthy food choices with portion control discussed. Encouraged to start a food diary, count calories and to consider  joining a support group. Sample diet sheets offered. Goals set by the patient for the next several months.   Weight /BMI 05/14/2018 03/11/2018 02/28/2018  WEIGHT 159 lb 1.9 oz 159 lb 1.3 oz 162 lb  HEIGHT 5\' 0"  5\' 0"  5\' 0"   BMI 31.08 kg/m2 31.07 kg/m2 31.64 kg/m2  Some encounter information is confidential  and restricted. Go to Review Flowsheets activity to see all data.      Major depression with psychotic features ([place]) PHQ 9 score of 15, currently being treated by psych Not suicidal or homicidal  Goiter Goiter with nodule, no dysphagia, [place] to further eval

## 2018-05-19 NOTE — Assessment & Plan Note (Signed)
Controlled, no change in medication DASH diet and commitment to daily physical activity for a minimum of 30 minutes discussed and encouraged, as a part of hypertension management. The importance of attaining a healthy weight is also discussed.  BP/Weight 05/14/2018 03/11/2018 02/28/2018 02/04/2018 12/10/2017 12/03/2017 0/07/4713  Systolic BP 806 386 854 883 014 159 733  Diastolic BP 84 85 80 83 98 110 86  Wt. (Lbs) 159.12 159.08 162 156 164 164.2 164  BMI 31.08 31.07 31.64 30.47 32.03 32.07 32.03  Some encounter information is confidential and restricted. Go to Review Flowsheets activity to see all data.

## 2018-05-19 NOTE — Assessment & Plan Note (Signed)
PHQ 9 score of 15, currently being treated by psych Not suicidal or homicidal

## 2018-05-19 NOTE — Assessment & Plan Note (Signed)
unchanged Patient re-educated about  the importance of commitment to a  minimum of 150 minutes of exercise per week.  The importance of healthy food choices with portion control discussed. Encouraged to start a food diary, count calories and to consider  joining a support group. Sample diet sheets offered. Goals set by the patient for the next several months.   Weight /BMI 05/14/2018 03/11/2018 02/28/2018  WEIGHT 159 lb 1.9 oz 159 lb 1.3 oz 162 lb  HEIGHT 5\' 0"  5\' 0"  5\' 0"   BMI 31.08 kg/m2 31.07 kg/m2 31.64 kg/m2  Some encounter information is confidential and restricted. Go to Review Flowsheets activity to see all data.

## 2018-05-19 NOTE — Assessment & Plan Note (Signed)
The patient's Controlled Substance registry is reviewed and compliance confirmed. Adequacy of  Pain control and level of function is assessed. Medication dosing is adjusted as deemed appropriate. Twelve weeks of medication is prescribed , patient signs for the script and is provided with a follow up appointment between 11 to 12 weeks .  

## 2018-05-29 ENCOUNTER — Ambulatory Visit (INDEPENDENT_AMBULATORY_CARE_PROVIDER_SITE_OTHER): Payer: BLUE CROSS/BLUE SHIELD | Admitting: Family Medicine

## 2018-05-29 ENCOUNTER — Encounter: Payer: Self-pay | Admitting: Family Medicine

## 2018-05-29 VITALS — BP 120/84 | HR 88 | Resp 16 | Ht 60.0 in | Wt 161.0 lb

## 2018-05-29 DIAGNOSIS — L03213 Periorbital cellulitis: Secondary | ICD-10-CM | POA: Diagnosis not present

## 2018-05-29 DIAGNOSIS — I10 Essential (primary) hypertension: Secondary | ICD-10-CM

## 2018-05-29 DIAGNOSIS — F323 Major depressive disorder, single episode, severe with psychotic features: Secondary | ICD-10-CM | POA: Diagnosis not present

## 2018-05-29 DIAGNOSIS — E669 Obesity, unspecified: Secondary | ICD-10-CM

## 2018-05-29 DIAGNOSIS — E66811 Obesity, class 1: Secondary | ICD-10-CM

## 2018-05-29 MED ORDER — CEPHALEXIN 500 MG PO CAPS: 500.0000 mg | ORAL_CAPSULE | Freq: Three times a day (TID) | ORAL | 0 refills | Status: DC

## 2018-05-29 MED ORDER — FLUCONAZOLE 150 MG PO TABS: ORAL_TABLET | ORAL | 0 refills | Status: DC

## 2018-05-29 NOTE — Assessment & Plan Note (Addendum)
3 day history, 1 week course of keflex and work excuse

## 2018-05-29 NOTE — Progress Notes (Signed)
   Rebecca Boyd     MRN: [id number]      DOB: 1976-04-27   HPI Rebecca Boyd is here with a 3 day h/o right eye swelling with increased drainage and light sensitivity.  Denies significant pain or change in vision. No inciting trauma Nor URI symptoms, fever or chills  ROS  Denies chest congestion, productive cough or wheezing. Denies chest pains, palpitations and leg swelling Denies abdominal pain, nausea, vomiting,diarrhea or constipation.   Denies dysuria, frequency, hesitancy or incontinence. Denies joint pain, swelling and limitation in mobility. Denies headaches, seizures, numbness, or tingling. Denies depression, anxiety or insomnia. Denies skin break down or rash.   PE  BP 120/84   Pulse 88   Resp 16   Ht 5' (1.524 m)   Wt 161 lb (73 kg)   SpO2 100%   BMI 31.44 kg/m   Patient alert and oriented and in no cardiopulmonary distress. Positive  facial asymmetry, EOMI,   oropharynx pink and moist.  Neck supple no JVD, no mass.Right supra orbital swelling, with mild right infra orbital swelling and erythema.ainage from righjt eye noted, purulent  Chest: Clear to auscultation bilaterally.  CVS: S1, S2 no murmurs, no S3.Regular rate.  ABD: Soft non tender.   Ext: No edema    Psych: Good eye contact, normal affect. Memory intact not anxious or depressed appearing.  CNS: CN 2-12 intact, power,  normal throughout.no focal deficits noted.   Assessment & Plan  Periorbital cellulitis of right eye 3 day history, 1 week course of keflex and work excuse  Essential hypertension, benign Controlled, no change in medication DASH diet and commitment to daily physical activity for a minimum of 30 minutes discussed and encouraged, as a part of hypertension management. The importance of attaining a healthy weight is also discussed.  BP/Weight 05/29/2018 05/14/2018 03/11/2018 02/28/2018 02/04/2018 05/06/2692 12/03/4625  Systolic BP 035 009 381 829 937 169 678  Diastolic BP 84 84 85  80 83 98 110  Wt. (Lbs) 161 159.12 159.08 162 156 164 164.2  BMI 31.44 31.08 31.07 31.64 30.47 32.03 32.07  Some encounter information is confidential and restricted. Go to Review Flowsheets activity to see all data.       Major depression with psychotic features ([place]) Still uncontrolled,  Not suicidal or homicidal, Psych is treating  Obesity (BMI 30.0-34.9) Deteriorated.  Patient re-educated about  the importance of commitment to a  minimum of 150 minutes of exercise per week as able.  The importance of healthy food choices with portion control discussed, as well as eating regularly and within a 12 hour window most days. The need to choose "clean , green" food 50 to 75% of the time is discussed, as well as to make water the primary drink and set a goal of 64 ounces water daily.  Encouraged to start a food diary,  and to consider  joining a support group. Sample diet sheets offered. Goals set by the patient for the next several months.   Weight /BMI 05/29/2018 05/14/2018 03/11/2018  WEIGHT 161 lb 159 lb 1.9 oz 159 lb 1.3 oz  HEIGHT 5\' 0"  5\' 0"  5\' 0"   BMI 31.44 kg/m2 31.08 kg/m2 31.07 kg/m2  Some encounter information is confidential and restricted. Go to Review Flowsheets activity to see all data.

## 2018-05-29 NOTE — Patient Instructions (Addendum)
F/U as before , call if you need me sooner  You are treated for infection around your right eye, take 7 day antibiotic course as directed  Work excuse from today, return 06/03/2018  Thank you  for choosing [facility]. We consider it a privelige to serve you.  Delivering excellent health care in a caring and  compassionate way is our goal.  Partnering with you,  so that together we can achieve this goal is our strategy.     Orbital Cellulitis  Orbital cellulitis is an infection in the eye socket (orbit) and the tissues that surround the eye. The infection can spread to the eyelids, eyebrow area, and cheek. It can also cause a pocket of pus to develop around the eye (orbital abscess). In severe cases, the infection can spread to the brain. Orbital cellulitis is a medical emergency. What are the causes? The most common cause of this condition is a bacterial infection. The infection usually spreads to the eye socket from another part of the body. The infection may start in one of these places:  Nose or sinuses.  Eyelids.  Facial skin.  Bloodstream. What increases the risk? You are more likely to develop this condition if you recently had one of the following:  Upper respiratory infection. This affects the nose, throat, and upper air passages.  Sinus infection.  Eyelid or facial infection.  Tooth infection.  Eye injury or an object on the surface of the eye or in the eyeball that should not be there (foreign body).  Infection that affects the entire body or the bloodstream (systemic infection). What are the signs or symptoms? Symptoms of this condition usually start quickly. Symptoms may include:  Eye pain that gets worse with eye movement.  Swelling around the eye.  Eye redness.  Bulging of the eye.  Inability to move the eye.  Double vision or decreased vision.  Fever. How is this diagnosed? This condition may be diagnosed based on your symptoms  and an eye exam. You may also have tests to confirm the diagnosis and to check for an orbital abscess. Other tests (cultures) may be done to find out which specific bacteria are causing the infection. Tests may include:  Complete blood count (CBC).  Blood culture.  Nose, sinus, or throat culture.  Imaging studies, such as a CT scan or MRI. How is this treated? This condition is usually treated in a hospital. Antibiotic medicines are given directly into a vein through an IV.  At first, you may get IV antibiotics to kill bacteria that often cause orbital cellulitis (broad spectrum antibiotics).  Your medicine may be changed if the culture test results suggest that another antibiotic would be better.  If the IV antibiotics are working to treat your infection, you may be switched to oral antibiotics and allowed to go home.  In some cases, surgery may be needed to drain an orbital abscess. Follow these instructions at home:  Take over-the-counter and prescription medicines only as told by your health care provider.  Take your antibiotic medicine as told by your health care provider. Do not stop taking the antibiotic even if you start to feel better.  Return to your normal activities as told by your health care provider. Ask your health care provider what activities are safe for you.  Keep all follow-up visits as told by your health care provider. This is important. Contact a health care provider if:  You have questions about your medicines.  Your pain  is not well-controlled. Get help right away if:  Your eye pain or swelling returns or it gets worse.  You have any changes in your vision.  You develop a fever.  You have vomiting.  You develop a severe headache or numbness in your face. Summary  Orbital cellulitis is an infection in the eye socket (orbit) and the tissues that surround the eye. The infection can spread to other areas.  Symptoms usually start quickly. Some of  the symptoms include eye pain that gets worse with movement, redness and swelling around the eye, and double or decreased vision.  Orbital cellulitis is a medical emergency, and it requires IV antibiotics for treatment.  See your health care provider right away if your symptoms return or get worse. This information is not intended to replace advice given to you by your health care provider. Make sure you discuss any questions you have with your health care provider. Document Released: 04/11/2001 Document Revised: 01/11/2017 Document Reviewed: 01/11/2017 Elsevier Interactive Patient Education  [employer].

## 2018-06-09 NOTE — Assessment & Plan Note (Signed)
Deteriorated.  Patient re-educated about  the importance of commitment to a  minimum of 150 minutes of exercise per week as able.  The importance of healthy food choices with portion control discussed, as well as eating regularly and within a 12 hour window most days. The need to choose "clean , green" food 50 to 75% of the time is discussed, as well as to make water the primary drink and set a goal of 64 ounces water daily.  Encouraged to start a food diary,  and to consider  joining a support group. Sample diet sheets offered. Goals set by the patient for the next several months.   Weight /BMI 05/29/2018 05/14/2018 03/11/2018  WEIGHT 161 lb 159 lb 1.9 oz 159 lb 1.3 oz  HEIGHT 5\' 0"  5\' 0"  5\' 0"   BMI 31.44 kg/m2 31.08 kg/m2 31.07 kg/m2  Some encounter information is confidential and restricted. Go to Review Flowsheets activity to see all data.

## 2018-06-09 NOTE — Assessment & Plan Note (Signed)
Still uncontrolled,  Not suicidal or homicidal, Psych is treating

## 2018-06-09 NOTE — Assessment & Plan Note (Signed)
Controlled, no change in medication DASH diet and commitment to daily physical activity for a minimum of 30 minutes discussed and encouraged, as a part of hypertension management. The importance of attaining a healthy weight is also discussed.  BP/Weight 05/29/2018 05/14/2018 03/11/2018 02/28/2018 02/04/2018 1/61/0960 08/03/4096  Systolic BP 119 147 829 562 130 865 784  Diastolic BP 84 84 85 80 83 98 110  Wt. (Lbs) 161 159.12 159.08 162 156 164 164.2  BMI 31.44 31.08 31.07 31.64 30.47 32.03 32.07  Some encounter information is confidential and restricted. Go to Review Flowsheets activity to see all data.

## 2018-06-10 ENCOUNTER — Encounter: Payer: Self-pay | Admitting: Family Medicine

## 2018-06-10 ENCOUNTER — Ambulatory Visit (INDEPENDENT_AMBULATORY_CARE_PROVIDER_SITE_OTHER): Payer: BLUE CROSS/BLUE SHIELD | Admitting: Family Medicine

## 2018-06-10 ENCOUNTER — Telehealth: Payer: Self-pay | Admitting: *Deleted

## 2018-06-10 VITALS — BP 110/80 | HR 70 | Ht 60.0 in | Wt 162.0 lb

## 2018-06-10 DIAGNOSIS — I10 Essential (primary) hypertension: Secondary | ICD-10-CM | POA: Diagnosis not present

## 2018-06-10 DIAGNOSIS — Z803 Family history of malignant neoplasm of breast: Secondary | ICD-10-CM

## 2018-06-10 DIAGNOSIS — R7301 Impaired fasting glucose: Secondary | ICD-10-CM | POA: Diagnosis not present

## 2018-06-10 DIAGNOSIS — G43709 Chronic migraine without aura, not intractable, without status migrainosus: Secondary | ICD-10-CM

## 2018-06-10 DIAGNOSIS — Z1231 Encounter for screening mammogram for malignant neoplasm of breast: Secondary | ICD-10-CM

## 2018-06-10 DIAGNOSIS — N760 Acute vaginitis: Secondary | ICD-10-CM

## 2018-06-10 DIAGNOSIS — F323 Major depressive disorder, single episode, severe with psychotic features: Secondary | ICD-10-CM

## 2018-06-10 DIAGNOSIS — M545 Low back pain, unspecified: Secondary | ICD-10-CM

## 2018-06-10 DIAGNOSIS — M5440 Lumbago with sciatica, unspecified side: Secondary | ICD-10-CM

## 2018-06-10 DIAGNOSIS — IMO0002 Reserved for concepts with insufficient information to code with codable children: Secondary | ICD-10-CM

## 2018-06-10 DIAGNOSIS — F419 Anxiety disorder, unspecified: Secondary | ICD-10-CM

## 2018-06-10 DIAGNOSIS — E669 Obesity, unspecified: Secondary | ICD-10-CM

## 2018-06-10 DIAGNOSIS — B9689 Other specified bacterial agents as the cause of diseases classified elsewhere: Secondary | ICD-10-CM

## 2018-06-10 HISTORY — DX: Family history of malignant neoplasm of breast: Z80.3

## 2018-06-10 MED ORDER — METRONIDAZOLE 0.75 % VA GEL: 1.0000 | Freq: Two times a day (BID) | VAGINAL | 5 refills | Status: DC

## 2018-06-10 NOTE — Telephone Encounter (Signed)
FMLA received via walk in  Copied Noted Sleeved 

## 2018-06-10 NOTE — Patient Instructions (Addendum)
F/U as before, call if you need me sooner  No need for repeat thyroid US  Unless obviously getting bigger and you do not have a goiter  Please schedule mammogram due May 2 or shortly after at checkout, early am appt preferred  Congrats on remaining nicotine free for the past year, keep it up  Medication is sent in as  Requested  I will  Message Psych re your PHQ 9 score today  Please change your food choice to improve your health, continue  working on it!  Thank you  for choosing [facility]. We consider it a privelige to serve you.  Delivering excellent health care in a caring and  compassionate way is our goal.  Partnering with you,  so that together we can achieve this goal is our strategy.

## 2018-06-10 NOTE — Assessment & Plan Note (Signed)
Deteriorated.  Patient re-educated about  the importance of commitment to a  minimum of 150 minutes of exercise per week as able.  The importance of healthy food choices with portion control discussed, as well as eating regularly and within a 12 hour window most days. The need to choose "clean , green" food 50 to 75% of the time is discussed, as well as to make water the primary drink and set a goal of 64 ounces water daily.  Encouraged to start a food diary,  and to consider  joining a support group. Sample diet sheets offered. Goals set by the patient for the next several months.   Weight /BMI 06/10/2018 05/29/2018 05/14/2018  WEIGHT 162 lb 161 lb 159 lb 1.9 oz  HEIGHT 5\' 0"  5\' 0"  5\' 0"   BMI 31.64 kg/m2 31.44 kg/m2 31.08 kg/m2  Some encounter information is confidential and restricted. Go to Review Flowsheets activity to see all data.

## 2018-06-10 NOTE — Assessment & Plan Note (Signed)
Reports recurrence following menses, metrogel precribed

## 2018-06-10 NOTE — Assessment & Plan Note (Signed)
Improved significantly, mnanaged by Psych will message Dr re current PHQ 9 score

## 2018-06-10 NOTE — Progress Notes (Signed)
Rebecca Boyd     MRN: [id number]      DOB: 05-27-1975   HPI Rebecca Boyd is here for follow up and re-evaluation of chronic medical conditions, medication management and review of any available recent lab and radiology data.  Preventive health is updated, specifically  Cancer screening and Immunization.   Questions or concerns regarding consultations or procedures which the PT has had in the interim are  addressed. The PT denies any adverse reactions to current medications since the last visit.  Sister dx with breast cancer at age 3, needs to keep up with her mammograms C/o recurrent BV after her cycles, medication ios prescribed  ROS Denies recent fever or chills. Denies sinus pressure, nasal congestion, ear pain or sore throat. Denies chest congestion, productive cough or wheezing. Denies chest pains, palpitations and leg swelling Denies abdominal pain, nausea, vomiting,diarrhea or constipation.   Denies dysuria, frequency, hesitancy or incontinence. Denies joint pain, swelling and limitation in mobility. Denies uncontrolled headaches,  Denies uncontrolled  depression, anxiety or insomnia. Denies skin break down or rash.   PE  BP 110/80   Pulse 70   Ht 5' (1.524 m)   Wt 162 lb (73.5 kg)   SpO2 98%   BMI 31.64 kg/m   Patient alert and oriented and in no cardiopulmonary distress.  HEENT: No facial asymmetry, EOMI,   oropharynx pink and moist.  Neck supple no JVD, no mass.  Chest: Clear to auscultation bilaterally.  CVS: S1, S2 no murmurs, no S3.Regular rate.  ABD: Soft non tender.   Ext: No edema  MS: Adequate ROM spine, shoulders, hips and knees.  Skin: Intact, no ulcerations or rash noted.  Psych: Good eye contact, normal affect. Memory intact not anxious or depressed appearing.  CNS: CN 2-12 intact, power,  normal throughout.no focal deficits noted.   Assessment & Plan  Essential hypertension, benign Controlled, no change in medication DASH diet and  commitment to daily physical activity for a minimum of 30 minutes discussed and encouraged, as a part of hypertension management. The importance of attaining a healthy weight is also discussed.  BP/Weight 06/10/2018 05/29/2018 05/14/2018 03/11/2018 02/28/2018 02/04/2018 0/34/7425  Systolic BP 956 387 564 332 951 884 166  Diastolic BP 80 84 84 85 80 83 98  Wt. (Lbs) 162 161 159.12 159.08 162 156 164  BMI 31.64 31.44 31.08 31.07 31.64 30.47 32.03  Some encounter information is confidential and restricted. Go to Review Flowsheets activity to see all data.       Obesity (BMI 30.0-34.9) Deteriorated.  Patient re-educated about  the importance of commitment to a  minimum of 150 minutes of exercise per week as able.  The importance of healthy food choices with portion control discussed, as well as eating regularly and within a 12 hour window most days. The need to choose "clean , green" food 50 to 75% of the time is discussed, as well as to make water the primary drink and set a goal of 64 ounces water daily.  Encouraged to start a food diary,  and to consider  joining a support group. Sample diet sheets offered. Goals set by the patient for the next several months.   Weight /BMI 06/10/2018 05/29/2018 05/14/2018  WEIGHT 162 lb 161 lb 159 lb 1.9 oz  HEIGHT 5\' 0"  5\' 0"  5\' 0"   BMI 31.64 kg/m2 31.44 kg/m2 31.08 kg/m2  Some encounter information is confidential and restricted. Go to Review Flowsheets activity to see all data.  Chronic migraine Well controlled on current regime and she is followed by Neurology, reports frequency less often than every 3 months  Major depression with psychotic features (Rebecca Boyd) Improved significantly, mnanaged by Psych will message Dr re current PHQ 9 score  Anxiety Controlled, no change in medication   Low back pain with radiation Controlled, no change in medication   BV (bacterial vaginosis) Reports recurrence following menses, metrogel  precribed  FH: breast cancer in relative when <58 years old Pt's only sister dx with breast cancer in 2019 at age 46, mammogram due in May, request to schedule

## 2018-06-10 NOTE — Assessment & Plan Note (Signed)
Controlled, no change in medication DASH diet and commitment to daily physical activity for a minimum of 30 minutes discussed and encouraged, as a part of hypertension management. The importance of attaining a healthy weight is also discussed.  BP/Weight 06/10/2018 05/29/2018 05/14/2018 03/11/2018 02/28/2018 02/04/2018 11/10/4578  Systolic BP 998 338 250 539 767 341 937  Diastolic BP 80 84 84 85 80 83 98  Wt. (Lbs) 162 161 159.12 159.08 162 156 164  BMI 31.64 31.44 31.08 31.07 31.64 30.47 32.03  Some encounter information is confidential and restricted. Go to Review Flowsheets activity to see all data.

## 2018-06-10 NOTE — Assessment & Plan Note (Signed)
Controlled, no change in medication  

## 2018-06-10 NOTE — Assessment & Plan Note (Signed)
Well controlled on current regime and she is followed by Neurology, reports frequency less often than every 3 months

## 2018-06-10 NOTE — Assessment & Plan Note (Signed)
Pt's only sister dx with breast cancer in 2019 at age 43, mammogram due in May, request to schedule

## 2018-06-11 ENCOUNTER — Encounter: Payer: Self-pay | Admitting: *Deleted

## 2018-06-11 LAB — COMPLETE METABOLIC PANEL WITH GFR
AG Ratio: 1.4 (calc) (ref 1.0–2.5)
ALKALINE PHOSPHATASE (APISO): 67 U/L (ref 31–125)
ALT: 16 U/L (ref 6–29)
AST: 15 U/L (ref 10–30)
Albumin: 3.9 g/dL (ref 3.6–5.1)
BUN: 8 mg/dL (ref 7–25)
CO2: 25 mmol/L (ref 20–32)
CREATININE: 0.75 mg/dL (ref 0.50–1.10)
Calcium: 9.4 mg/dL (ref 8.6–10.2)
Chloride: 105 mmol/L (ref 98–110)
GFR, Est African American: 114 mL/min/{1.73_m2} (ref >=60)
GFR, Est Non African American: 98 mL/min/{1.73_m2} (ref >=60)
GLOBULIN: 2.8 g/dL (ref 1.9–3.7)
Glucose, Bld: 96 mg/dL (ref 65–139)
POTASSIUM: 4 mmol/L (ref 3.5–5.3)
SODIUM: 138 mmol/L (ref 135–146)
Total Bilirubin: 0.4 mg/dL (ref 0.2–1.2)
Total Protein: 6.7 g/dL (ref 6.1–8.1)

## 2018-06-11 LAB — LIPID PANEL
CHOL/HDL RATIO: 4.3 (calc) (ref <5.0)
CHOLESTEROL: 191 mg/dL (ref <200)
HDL: 44 mg/dL — ABNORMAL LOW (ref >=50)
LDL CHOLESTEROL (CALC): 118 mg/dL — AB
NON-HDL CHOLESTEROL (CALC): 147 mg/dL — AB (ref <130)
Triglycerides: 174 mg/dL — ABNORMAL HIGH (ref <150)

## 2018-06-11 LAB — HEMOGLOBIN A1C
HEMOGLOBIN A1C: 5.2 %{Hb} (ref <5.7)
MEAN PLASMA GLUCOSE: 103 (calc)
eAG (mmol/L): 5.7 (calc)

## 2018-06-11 LAB — TSH: TSH: 1.67 m[IU]/L

## 2018-06-11 NOTE — Telephone Encounter (Signed)
Forms completed Called LVM for Mrs Rebecca Boyd forms [phone]

## 2018-06-13 DIAGNOSIS — I10 Essential (primary) hypertension: Secondary | ICD-10-CM

## 2018-06-13 DIAGNOSIS — G43709 Chronic migraine without aura, not intractable, without status migrainosus: Secondary | ICD-10-CM

## 2018-06-19 ENCOUNTER — Telehealth: Payer: Self-pay | Admitting: Neurology

## 2018-06-19 ENCOUNTER — Encounter: Payer: Self-pay | Admitting: *Deleted

## 2018-06-19 DIAGNOSIS — G43709 Chronic migraine without aura, not intractable, without status migrainosus: Secondary | ICD-10-CM | POA: Diagnosis not present

## 2018-06-19 NOTE — Telephone Encounter (Signed)
Pt's states she's had a migraine since yesterday. She has taken topamax (daily) and imitrex with no relief. What else can she do? Please call to advise

## 2018-06-19 NOTE — Telephone Encounter (Signed)
Order given to [facility] in intrafusion.  Pt will come at 1400 for depacon infusion, relayed that no driver needed for just depacon.  She verbalized understanding.

## 2018-06-19 NOTE — Telephone Encounter (Signed)
You can check with infusion and see if they can do depacon 500mg  IV may repeat once

## 2018-06-19 NOTE — Telephone Encounter (Signed)
Spoke to pt.  She is having migraine since yesterday, has taken imitrex inj helps some but headache comes back.  She took 0500 this am imitrex imj at level 10 and now level 7.  Other associated sx of nausea/ photosensitivity.  She is taking topamax daily.  Her med list notes changes from Dr. Moshe Cipro 06-04-18, where pamelor, tizanidine and maxalt discontinued. I asked her and she said that maxalt was taken off since she is on imitex inj, but she is taking the pamelor and tizanidine. She is not pregnant, has driver if needed.  She has had good results from infusion if ordered.  Please advise.

## 2018-06-25 ENCOUNTER — Encounter (HOSPITAL_COMMUNITY): Payer: Self-pay | Admitting: Emergency Medicine

## 2018-06-25 ENCOUNTER — Emergency Department (HOSPITAL_COMMUNITY): Payer: BLUE CROSS/BLUE SHIELD

## 2018-06-25 ENCOUNTER — Other Ambulatory Visit: Payer: Self-pay

## 2018-06-25 ENCOUNTER — Emergency Department (HOSPITAL_COMMUNITY)
Admission: EM | Admit: 2018-06-25 | Discharge: 2018-06-25 | Disposition: A | Discharge status: Home / Self Care | Payer: BLUE CROSS/BLUE SHIELD | Attending: Emergency Medicine | Admitting: Emergency Medicine

## 2018-06-25 DIAGNOSIS — Z87891 Personal history of nicotine dependence: Secondary | ICD-10-CM | POA: Diagnosis not present

## 2018-06-25 DIAGNOSIS — F419 Anxiety disorder, unspecified: Secondary | ICD-10-CM | POA: Insufficient documentation

## 2018-06-25 DIAGNOSIS — Z79899 Other long term (current) drug therapy: Secondary | ICD-10-CM | POA: Insufficient documentation

## 2018-06-25 DIAGNOSIS — I10 Essential (primary) hypertension: Secondary | ICD-10-CM | POA: Insufficient documentation

## 2018-06-25 DIAGNOSIS — F329 Major depressive disorder, single episode, unspecified: Secondary | ICD-10-CM | POA: Diagnosis not present

## 2018-06-25 DIAGNOSIS — K047 Periapical abscess without sinus: Secondary | ICD-10-CM | POA: Diagnosis not present

## 2018-06-25 DIAGNOSIS — Z91040 Latex allergy status: Secondary | ICD-10-CM | POA: Insufficient documentation

## 2018-06-25 DIAGNOSIS — K0889 Other specified disorders of teeth and supporting structures: Secondary | ICD-10-CM | POA: Diagnosis not present

## 2018-06-25 DIAGNOSIS — J3489 Other specified disorders of nose and nasal sinuses: Secondary | ICD-10-CM | POA: Diagnosis not present

## 2018-06-25 LAB — CBC WITH DIFFERENTIAL/PLATELET
ABS IMMATURE GRANULOCYTES: 0.03 10*3/uL (ref 0.00–0.07)
Basophils Absolute: 0 10*3/uL (ref 0.0–0.1)
Basophils Relative: 0 %
Eosinophils Absolute: 0.1 10*3/uL (ref 0.0–0.5)
Eosinophils Relative: 1 %
HCT: 40.8 % (ref 36.0–46.0)
HEMOGLOBIN: 13.9 g/dL (ref 12.0–15.0)
Immature Granulocytes: 0 %
Lymphocytes Relative: 18 %
Lymphs Abs: 1.4 10*3/uL (ref 0.7–4.0)
MCH: 30.1 pg (ref 26.0–34.0)
MCHC: 34.1 g/dL (ref 30.0–36.0)
MCV: 88.3 fL (ref 80.0–100.0)
Monocytes Absolute: 0.6 10*3/uL (ref 0.1–1.0)
Monocytes Relative: 8 %
NEUTROS ABS: 5.7 10*3/uL (ref 1.7–7.7)
NEUTROS PCT: 73 %
Platelets: 188 10*3/uL (ref 150–400)
RBC: 4.62 MIL/uL (ref 3.87–5.11)
RDW: 13.2 % (ref 11.5–15.5)
WBC: 7.8 10*3/uL (ref 4.0–10.5)
nRBC: 0 % (ref 0.0–0.2)

## 2018-06-25 LAB — I-STAT BETA HCG BLOOD, ED (MC, WL, AP ONLY): I-stat hCG, quantitative: <5 m[IU]/mL (ref <5)

## 2018-06-25 LAB — BASIC METABOLIC PANEL
Anion gap: 8 (ref 5–15)
BUN: 7 mg/dL (ref 6–20)
CO2: 21 mmol/L — ABNORMAL LOW (ref 22–32)
CREATININE: 0.58 mg/dL (ref 0.44–1.00)
Calcium: 8.7 mg/dL — ABNORMAL LOW (ref 8.9–10.3)
Chloride: 104 mmol/L (ref 98–111)
GFR calc non Af Amer: >60 mL/min (ref >60)
Glucose, Bld: 108 mg/dL — ABNORMAL HIGH (ref 70–99)
Potassium: 3.8 mmol/L (ref 3.5–5.1)
Sodium: 133 mmol/L — ABNORMAL LOW (ref 135–145)

## 2018-06-25 MED ORDER — KETOROLAC TROMETHAMINE 30 MG/ML IJ SOLN
30.0000 mg | Freq: Once | INTRAMUSCULAR | Status: AC
  Administered 2018-06-25: 30 mg via INTRAVENOUS
  Filled 2018-06-25: qty 1

## 2018-06-25 MED ORDER — CLINDAMYCIN PHOSPHATE 900 MG/50ML IV SOLN
900.0000 mg | Freq: Once | INTRAVENOUS | Status: AC
  Administered 2018-06-25: 900 mg via INTRAVENOUS
  Filled 2018-06-25: qty 50

## 2018-06-25 MED ORDER — CLINDAMYCIN HCL 150 MG PO CAPS: 300.0000 mg | ORAL_CAPSULE | Freq: Four times a day (QID) | ORAL | 0 refills | Status: DC

## 2018-06-25 MED ORDER — SODIUM CHLORIDE 0.9 % IV BOLUS
1000.0000 mL | Freq: Once | INTRAVENOUS | Status: AC
  Administered 2018-06-25: 1000 mL via INTRAVENOUS

## 2018-06-25 MED ORDER — ONDANSETRON HCL 4 MG/2ML IJ SOLN
4.0000 mg | Freq: Once | INTRAMUSCULAR | Status: AC
  Administered 2018-06-25: 4 mg via INTRAVENOUS
  Filled 2018-06-25: qty 2

## 2018-06-25 MED ORDER — IOHEXOL 300 MG/ML  SOLN
75.0000 mL | Freq: Once | INTRAMUSCULAR | Status: AC | PRN
  Administered 2018-06-25: 75 mL via INTRAVENOUS

## 2018-06-25 NOTE — ED Provider Notes (Signed)
[facility] EMERGENCY DEPARTMENT Provider Note   CSN: [id number] Arrival date & time: 06/25/18  0210  Time seen 2:55 AM  History   Chief Complaint Chief Complaint  Patient presents with  . Dental Pain    HPI Rebecca Boyd is a 43 y.o. female.     HPI patient states she started having pain on February 23 in her left upper tooth which she states she has a wisdom tooth that needs to be cut out.  She was seen by dentist about a month ago when she was told that.  However her blood pressure was too high and she has a follow-up appointment on March 4 with Dr. Andree Elk, her dentist.  She denies any fever, difficulty swallowing or breathing.  She started noticing swelling of her face on February 24.  She has Percocet that she takes for chronic low back pain and took 1 tonight without relief.  PCP Fayrene Helper, MD   Past Medical History:  Diagnosis Date  . Anemia   . Anxiety   . Depressive disorder, not elsewhere classified   . Hypertension   . Migraine   . Migraines   . Miscarriage 2009  . Neck pain   . Vaginitis    cyctitis    Patient Active Problem List   Diagnosis Date Noted  . FH: breast cancer in relative when <61 years old 06/10/2018  . Major depression with psychotic features ([place]) 12/02/2017  . Rash and nonspecific skin eruption 05/14/2017  . Chronic migraine 12/06/2016  . Encounter for chronic pain management 05/10/2016  . Headache 03/16/2016  . Obesity (BMI 30.0-34.9) 03/17/2015  . Essential hypertension, benign 01/07/2014  . Cervical neck pain with evidence of disc disease 10/02/2013  . Low back pain with radiation 07/30/2013  . 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 BV (bacterial vaginosis) 05/19/2011  . Vitamin D deficiency 10/27/2010  . Anxiety 10/26/2010    Past Surgical History:  Procedure Laterality Date  . cervical cryotherapy N/A 1999  . DILITATION & CURRETTAGE/HYSTROSCOPY WITH THERMACHOICE ABLATION  08/26/2013   Procedure: DILATATION &  CURETTAGE/HYSTEROSCOPY WITH THERMACHOICE ENDOMETRIAL ABLATION Procedure #2 Total Therapy Time=min       sec;  Surgeon: Jonnie Kind, MD;  Location: [facility];  Service: Gynecology;;  . LAPAROSCOPIC BILATERAL SALPINGECTOMY Bilateral 08/26/2013   Procedure: LAPAROSCOPIC BILATERAL SALPINGECTOMY AND REMOVAL OF LEFT PERITUBAL CYST Procedure #1;  Surgeon: Jonnie Kind, MD;  Location: [facility];  Service: Gynecology;  Laterality: Bilateral;  . LAPAROSCOPIC LYSIS OF ADHESIONS  08/26/2013   Procedure: LAPAROSCOPIC LYSIS OF ADHESIONS Procedure #1;  Surgeon: Jonnie Kind, MD;  Location: [facility];  Service: Gynecology;;  . Carmelia Bake LESION REMOVAL  08/26/2013   Procedure: REMOVAL OF VULVAR SEBACEOUS CYST Procedure #3;  Surgeon: Jonnie Kind, MD;  Location: [facility];  Service: Gynecology;;     OB History   No obstetric history on file.      Home Medications    Prior to Admission medications   Medication Sig Start Date End Date Taking? Authorizing Provider  ALPRAZolam Duanne Moron) 1 MG tablet Take one tablet once daily at bedtime for anxiety and sleep 06/07/18   Fayrene Helper, MD  amLODipine (NORVASC) 2.5 MG tablet TAKE 1 TABLET BY MOUTH ONCE DAILY 01/10/18   Fayrene Helper, MD  clindamycin (CLEOCIN) 150 MG capsule Take 2 capsules (300 mg total) by mouth 4 (four) times daily. . 06/25/18   Rolland Porter, MD  FLUoxetine (PROZAC) 20 MG capsule Take 1  capsule (20 mg total) by mouth daily. 02/28/18 02/28/19  Cloria Spring, MD  metroNIDAZOLE (METROGEL VAGINAL) 0.75 % vaginal gel Place 1 Applicatorful vaginally 2 (two) times daily. 06/10/18   Fayrene Helper, MD  oxyCODONE-acetaminophen (PERCOCET/ROXICET) 5-325 MG tablet Take one tablet once daily for back pain 05/29/18 06/28/18  Fayrene Helper, MD  oxyCODONE-acetaminophen (PERCOCET/ROXICET) 5-325 MG tablet Take one tablet once daily for back pain 06/28/18 07/28/18  Fayrene Helper, MD  oxyCODONE-acetaminophen (PERCOCET/ROXICET) 5-325 MG tablet Take one  tablet once daily for back pain 07/28/18 08/27/18  Fayrene Helper, MD  QUEtiapine (SEROQUEL) 25 MG tablet Take 1 tablet (25 mg total) by mouth at bedtime. 02/28/18 02/28/19  Cloria Spring, MD  SUMAtriptan (IMITREX) 6 MG/0.5ML SOLN injection Inject 0.5 mLs (6 mg total) into the skin every 2 (two) hours as needed for migraine or headache. May repeat in 2 hours if headache persists or recurs. 06/04/17   [name], MD  topiramate (TOPAMAX) 100 MG tablet Take 1 tablet (100 mg total) by mouth 2 (two) times daily. 06/04/17   [name], MD  triamcinolone cream (KENALOG) 0.1 % Apply to the skin neck down 1-2 times a day as needed for rash 09/25/17   [provider]    Family History Family History  Problem Relation Age of Onset  . Drug abuse Mother   . Hypertension Father   . Hypertension Maternal Grandmother   . Hypertension Maternal Grandfather   . Hypertension Paternal Grandmother     Social History Social History   Tobacco Use  . Smoking status: Former Smoker    Packs/day: 0.00    Types: Cigars    Last attempt to quit: 01/05/2017    Years since quitting: 1.4  . Smokeless tobacco: Never Used  Substance Use Topics  . Alcohol use: Yes    Comment: occasional  . Drug use: No     Allergies   Diphenhydramine hcl; Orange fruit; and Latex   Review of Systems Review of Systems  All other systems reviewed and are negative.    Physical Exam Updated Vital Signs BP (!) 160/109 (BP Location: Left Arm)   Pulse 98   Temp 99 F (37.2 C) (Oral)   Resp 18   LMP 04/30/2018   SpO2 99%   Vital signs normal except for hypertension   Physical Exam Vitals signs and nursing note reviewed.  Constitutional:      Appearance: Normal appearance.     Comments: Appears uncomfortable  HENT:     Head: Normocephalic.     Comments: Patient is noted to have swelling on the left side of face    Right Ear: External ear normal.     Left Ear: External ear normal.     Nose: Nose normal.      Mouth/Throat:     Dentition: Dental tenderness and dental caries present.     Pharynx: Uvula midline. Pharyngeal swelling and posterior oropharyngeal erythema present. No oropharyngeal exudate or uvula swelling.      Comments: Patient is noted to have several fillings on her molars both upper and lower and left and right side of her mouth consistent with prior dental care. Eyes:     Extraocular Movements: Extraocular movements intact.     Conjunctiva/sclera: Conjunctivae normal.     Pupils: Pupils are equal, round, and reactive to light.  Neck:     Musculoskeletal: Normal range of motion.  Cardiovascular:     Rate and Rhythm: Normal rate and  regular rhythm.  Pulmonary:     Effort: Pulmonary effort is normal. No respiratory distress.  Musculoskeletal: Normal range of motion.  Skin:    General: Skin is warm and dry.  Neurological:     General: No focal deficit present.     Mental Status: She is alert and oriented to person, place, and time.     Cranial Nerves: No cranial nerve deficit.  Psychiatric:        Mood and Affect: Mood normal.        Behavior: Behavior normal.        Thought Content: Thought content normal.        ED Treatments / Results  Labs (all labs ordered are listed, but only abnormal results are displayed) Results for orders placed or performed during the hospital encounter of 26/83/41  Basic metabolic panel  Result Value Ref Range   Sodium 133 (L) 135 - 145 mmol/L   Potassium 3.8 3.5 - 5.1 mmol/L   Chloride 104 98 - 111 mmol/L   CO2 21 (L) 22 - 32 mmol/L   Glucose, Bld 108 (H) 70 - 99 mg/dL   BUN 7 6 - 20 mg/dL   Creatinine, Ser 0.58 0.44 - 1.00 mg/dL   Calcium 8.7 (L) 8.9 - 10.3 mg/dL   GFR calc non Af Amer >60 >60 mL/min   GFR calc Af Amer >60 >60 mL/min   Anion gap 8 5 - 15  CBC with Differential  Result Value Ref Range   WBC 7.8 4.0 - 10.5 K/uL   RBC 4.62 3.87 - 5.11 MIL/uL   Hemoglobin 13.9 12.0 - 15.0 g/dL   HCT 40.8 36.0 - 46.0 %   MCV  88.3 80.0 - 100.0 fL   MCH 30.1 26.0 - 34.0 pg   MCHC 34.1 30.0 - 36.0 g/dL   RDW 13.2 11.5 - 15.5 %   Platelets 188 150 - 400 K/uL   nRBC 0.0 0.0 - 0.2 %   Neutrophils Relative % 73 %   Neutro Abs 5.7 1.7 - 7.7 K/uL   Lymphocytes Relative 18 %   Lymphs Abs 1.4 0.7 - 4.0 K/uL   Monocytes Relative 8 %   Monocytes Absolute 0.6 0.1 - 1.0 K/uL   Eosinophils Relative 1 %   Eosinophils Absolute 0.1 0.0 - 0.5 K/uL   Basophils Relative 0 %   Basophils Absolute 0.0 0.0 - 0.1 K/uL   Immature Granulocytes 0 %   Abs Immature Granulocytes 0.03 0.00 - 0.07 K/uL  I-Stat beta hCG blood, ED  Result Value Ref Range   I-stat hCG, quantitative <5.0 <5 mIU/mL   Comment 3           Laboratory interpretation all normal except mild hyponatremia    EKG None  Radiology Ct Maxillofacial W Contrast  Result Date: 06/25/2018 CLINICAL DATA:  43 y/o  F; left-sided dental pain since yesterday. EXAM: CT MAXILLOFACIAL WITH CONTRAST TECHNIQUE: Multidetector CT imaging of the maxillofacial structures was performed with intravenous contrast. Multiplanar CT image reconstructions were also generated. CONTRAST:  65mL OMNIPAQUE IOHEXOL 300 MG/ML  SOLN COMPARISON:  None. FINDINGS: Osseous: Large carie of the left posterior most maxillary molar and periapical cyst (series 6 image 34-36). Additionally, there is inflammation in the overlying soft tissues. No abscess identified. Orbits: Negative. No traumatic or inflammatory finding. Sinuses: Mild left ethmoid, mild right maxillary, and moderate left maxillary sinus mucosal thickening. Normal aeration of the mastoid air cells. Soft tissues: As above. Limited intracranial: No significant  or unexpected finding. IMPRESSION: 1. Left posterior most maxillary molar large carie and periapical cyst with overlying soft tissue inflammation compatible with dental infection. No abscess identified. 2. Mild paranasal sinus disease. Electronically Signed   By: Kristine Garbe M.D.    On: 06/25/2018 04:05    Procedures Procedures (including critical care time)  Medications Ordered in ED Medications  sodium chloride 0.9 % bolus 1,000 mL (1,000 mLs Intravenous New Bag/Given 06/25/18 0317)  ketorolac (TORADOL) 30 MG/ML injection 30 mg (30 mg Intravenous Given 06/25/18 0317)  ondansetron (ZOFRAN) injection 4 mg (4 mg Intravenous Given 06/25/18 0317)  clindamycin (CLEOCIN) IVPB 900 mg (900 mg Intravenous New Bag/Given 06/25/18 0321)  iohexol (OMNIPAQUE) 300 MG/ML solution 75 mL (75 mLs Intravenous Contrast Given 06/25/18 0336)     Initial Impression / Assessment and Plan / ED Course  I have reviewed the triage vital signs and the nursing notes.  Pertinent labs & imaging results that were available during my care of the patient were reviewed by me and considered in my medical decision making (see chart for details).    Patient was given IV fluids, IV pain medications.  She was started on IV clindamycin for suspected dental abscess.  CT maxillofacial was done to see the extent of it.  4:20 AM patient is resting and sleeping.  We discussed her CT which did not show an abscess but just infection or cellulitis.  She was discharged home with antibiotics.  She already has Percocet to take at home.  She can add Motrin to that.  She should use ice on her face and not heat.  She already has an appointment on the fourth with her dentist.  She should have been on the antibiotics a week before that appointment so hopefully her infection will be improved enough they can do some definitive removal of that decayed tooth.     Patient gets #30 Percocet monthly, last filled January 29.  Final Clinical Impressions(s) / ED Diagnoses   Final diagnoses:  Dental infection    ED Discharge Orders         Ordered    clindamycin (CLEOCIN) 150 MG capsule  4 times daily     06/25/18 0429        OTC ibuprofen    Rolland Porter, MD 06/25/18 [phone]

## 2018-06-25 NOTE — Discharge Instructions (Addendum)
Take the antibiotic until gone.  You have pain medicine you can take at home.  Also take ibuprofen 600 mg 4 times a day.  Use ice packs over the swollen area.  Read over the dental abscess information although you did not have an abscess you do have an infection around the tooth and gums.  Keep your appointment with the dentist next week.  Return to the emergency department if the swelling of your face continues to get a lot worse, you have difficulty swallowing or breathing or you feel like you are getting worse.

## 2018-06-25 NOTE — ED Triage Notes (Signed)
Pt C/O left sided dental pain. Pt states she has taken 1 percocet at 1900 last night with relief.

## 2018-07-11 ENCOUNTER — Telehealth: Payer: Self-pay | Admitting: Family Medicine

## 2018-07-11 ENCOUNTER — Other Ambulatory Visit: Payer: Self-pay | Admitting: Family Medicine

## 2018-07-11 MED ORDER — BENZONATATE 100 MG PO CAPS: 100.0000 mg | ORAL_CAPSULE | Freq: Two times a day (BID) | ORAL | 0 refills | Status: DC | PRN

## 2018-07-11 NOTE — Telephone Encounter (Signed)
Pt is calling she has a nagging cough for about a week now, and she wants something called in.  I offered her an appointment for tomorrow, and she advised she was working

## 2018-07-11 NOTE — Telephone Encounter (Signed)
Tessalon perles are prescribed

## 2018-07-11 NOTE — Progress Notes (Signed)
Tessalon perle 

## 2018-07-12 NOTE — Telephone Encounter (Signed)
Patient notified of prescription with verbal understanding

## 2018-07-31 ENCOUNTER — Telehealth: Payer: Self-pay | Admitting: *Deleted

## 2018-07-31 ENCOUNTER — Other Ambulatory Visit: Payer: Self-pay | Admitting: *Deleted

## 2018-07-31 ENCOUNTER — Ambulatory Visit (INDEPENDENT_AMBULATORY_CARE_PROVIDER_SITE_OTHER): Payer: BLUE CROSS/BLUE SHIELD | Admitting: Neurology

## 2018-07-31 ENCOUNTER — Encounter: Payer: Self-pay | Admitting: Neurology

## 2018-07-31 ENCOUNTER — Other Ambulatory Visit: Payer: Self-pay

## 2018-07-31 DIAGNOSIS — G43709 Chronic migraine without aura, not intractable, without status migrainosus: Secondary | ICD-10-CM

## 2018-07-31 DIAGNOSIS — IMO0002 Reserved for concepts with insufficient information to code with codable children: Secondary | ICD-10-CM

## 2018-07-31 MED ORDER — ERENUMAB-AOOE 70 MG/ML ~~LOC~~ SOAJ: 70.0000 mg | SUBCUTANEOUS | 11 refills | Status: DC

## 2018-07-31 MED ORDER — SUMATRIPTAN SUCCINATE 6 MG/0.5ML ~~LOC~~ SOLN: SUBCUTANEOUS | 11 refills | Status: DC

## 2018-07-31 MED ORDER — SUMATRIPTAN SUCCINATE 6 MG/0.5ML ~~LOC~~ SOLN: 6.0000 mg | SUBCUTANEOUS | 11 refills | Status: DC | PRN

## 2018-07-31 MED ORDER — TIZANIDINE HCL 4 MG PO TABS: 4.0000 mg | ORAL_TABLET | Freq: Four times a day (QID) | ORAL | 5 refills | Status: DC | PRN

## 2018-07-31 MED ORDER — ONDANSETRON 4 MG PO TBDP: 4.0000 mg | ORAL_TABLET | Freq: Three times a day (TID) | ORAL | 6 refills | Status: DC | PRN

## 2018-07-31 MED ORDER — TOPIRAMATE 100 MG PO TABS: 100.0000 mg | ORAL_TABLET | Freq: Two times a day (BID) | ORAL | 4 refills | Status: DC

## 2018-07-31 NOTE — Progress Notes (Signed)
Virtual Visit via Phone  I connected with [name] on 07/31/18 at  by video and verified that I am speaking with the correct person using two identifiers.   I discussed the limitations, risks, security and privacy concerns of performing an evaluation and management service by phone and the availability of in person appointments. I also discussed with the patient that there may be a patient responsible charge related to this service. The patient expressed understanding and agreed to proceed.   History of Present Illness: HISTORY OF PRESENT ILLNESS: Rebecca Boyd a 43 years old right-handed female, seen in refer by her primary care doctor Fayrene Helper, for evaluation of headache, initial evaluation was December 06 2016.  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  She reported a history of migraine headaches since 2007, used to be intermittent, couple times each months, but since June 2018, she reported increased headache, to almost daily bases, constant low-grade lateralized headache, frequent exacerbation to severe pounding headache with associated light noise sensitivity, nauseous, sometimes associated seeing flashlight prior to and during her severe migraine headache, her headache can last for couple days,  She was started on Topamax 100 mg twice a day about 2 months ago, with mild side effect, but no significant help, she also takes Imitrex 50 mg as needed, is helpful most of the time,  Trigger for her migraine headaches are weather change, bright light, strong smells,  UPDATE Sept 6 2018: She has tried imitrex 50mg  as needed few times over past one month, she throw up each time.  Today, she complains of 2 weeks of severe left parietal area sharp headaches,she has tried imitrex, phenergan did not help.   UPDATE Feb 4th 2019:  Her migraine has much improved, now she  has it about twice a month, lasting for 2 days, she throw up maxalt dissolvable sometimes because of severe nausea during  migraine headaches,   UPDATE 8/5/2019CM Ms. [name], 43 year old female returns for follow-up with history of migraine headaches.  She is doing much better with her migraines has maybe 2/ month.  She is currently on nortriptyline 20 mg at bedtime Topamax 100 twice a day, Imitrex and Maxalt acutely as needed.  She is not aware of any specific foods that cause problems.  She returns for reevaluation  Virtual visit July 31 2018: She still has frequent migraine headaches, in one month, she can have headache days up to 10 days in a month, she often has to use imitrex injection twice in 1 day for her migraine. Sometimes, she comes to office for infusions.   Observations/Objective: I have reviewed problem lists, medications, allergies.  Assessment and Plan: Chronic migraine headaches  Suboptimal control with Topamax 100 mg twice a day  Add on Aimovig 70 mg subcutaneous once every month  Imitrex injection as needed, may combine it with Aleve, Zofran, tizanidine for prolonged severe headache   Follow Up Instructions:   6 months with Judson Roch   I discussed the assessment and treatment plan with the patient. The patient was provided an opportunity to ask questions and all were answered. The patient agreed with the plan and demonstrated an understanding of the instructions.   The patient was advised to call back or seek an in-person evaluation if the symptoms worsen or if the condition fails to improve as anticipated.  I provided 21 minutes of non-face-to-face time during this  encounter.   [name], MD

## 2018-07-31 NOTE — Telephone Encounter (Signed)
Aimovig PA approved by [place] (case completed over the phone at [phone].  Valid for the lifetime of her policy.  Case WC#[id number].

## 2018-08-05 ENCOUNTER — Ambulatory Visit: Payer: BLUE CROSS/BLUE SHIELD | Admitting: Nurse Practitioner

## 2018-08-15 ENCOUNTER — Emergency Department (HOSPITAL_COMMUNITY)
Admission: EM | Admit: 2018-08-15 | Discharge: 2018-08-15 | Disposition: A | Discharge status: Home / Self Care | Payer: BLUE CROSS/BLUE SHIELD | Attending: Emergency Medicine | Admitting: Emergency Medicine

## 2018-08-15 ENCOUNTER — Other Ambulatory Visit: Payer: Self-pay

## 2018-08-15 ENCOUNTER — Encounter (HOSPITAL_COMMUNITY): Payer: Self-pay | Admitting: Emergency Medicine

## 2018-08-15 ENCOUNTER — Emergency Department (HOSPITAL_COMMUNITY): Payer: BLUE CROSS/BLUE SHIELD

## 2018-08-15 DIAGNOSIS — I10 Essential (primary) hypertension: Secondary | ICD-10-CM | POA: Diagnosis not present

## 2018-08-15 DIAGNOSIS — Y939 Activity, unspecified: Secondary | ICD-10-CM | POA: Insufficient documentation

## 2018-08-15 DIAGNOSIS — Z79899 Other long term (current) drug therapy: Secondary | ICD-10-CM | POA: Diagnosis not present

## 2018-08-15 DIAGNOSIS — Y999 Unspecified external cause status: Secondary | ICD-10-CM | POA: Insufficient documentation

## 2018-08-15 DIAGNOSIS — Z87891 Personal history of nicotine dependence: Secondary | ICD-10-CM | POA: Diagnosis not present

## 2018-08-15 DIAGNOSIS — S99922A Unspecified injury of left foot, initial encounter: Secondary | ICD-10-CM | POA: Diagnosis not present

## 2018-08-15 DIAGNOSIS — X509XXA Other and unspecified overexertion or strenuous movements or postures, initial encounter: Secondary | ICD-10-CM | POA: Diagnosis not present

## 2018-08-15 DIAGNOSIS — S92352A Displaced fracture of fifth metatarsal bone, left foot, initial encounter for closed fracture: Secondary | ICD-10-CM | POA: Diagnosis not present

## 2018-08-15 DIAGNOSIS — Y929 Unspecified place or not applicable: Secondary | ICD-10-CM | POA: Diagnosis not present

## 2018-08-15 DIAGNOSIS — S92355A Nondisplaced fracture of fifth metatarsal bone, left foot, initial encounter for closed fracture: Secondary | ICD-10-CM | POA: Diagnosis not present

## 2018-08-15 DIAGNOSIS — Z91040 Latex allergy status: Secondary | ICD-10-CM | POA: Insufficient documentation

## 2018-08-15 MED ORDER — IBUPROFEN 400 MG PO TABS
600.0000 mg | ORAL_TABLET | Freq: Once | ORAL | Status: AC
  Administered 2018-08-15: 600 mg via ORAL
  Filled 2018-08-15: qty 2

## 2018-08-15 MED ORDER — IBUPROFEN 600 MG PO TABS: 600.0000 mg | ORAL_TABLET | Freq: Four times a day (QID) | ORAL | 0 refills | Status: AC | PRN

## 2018-08-15 NOTE — ED Provider Notes (Signed)
[facility] EMERGENCY DEPARTMENT Provider Note   CSN: [id number] Arrival date & time: 08/15/18  1901    History   Chief Complaint Chief Complaint  Patient presents with  . Foot Pain    HPI Rebecca Boyd is a 43 y.o. female who presents to the ED complaining of sudden onset, constant, gradually worsening, achy, left foot pain that began about 1 hr ago after inverting ankle on a rock. No head injury or LOC. Pt reports she applied aspercreme afterwards without relief. She is able to ambulate but limps due to the pain. Denies any other symptoms.        Past Medical History:  Diagnosis Date  . Anemia   . Anxiety   . Depressive disorder, not elsewhere classified   . Hypertension   . Migraine   . Migraines   . Miscarriage 2009  . Neck pain   . Vaginitis    cyctitis    Patient Active Problem List   Diagnosis Date Noted  . FH: breast cancer in relative when <76 years old 06/10/2018  . Major depression with psychotic features ([place]) 12/02/2017  . Rash and nonspecific skin eruption 05/14/2017  . Chronic migraine 12/06/2016  . Encounter for chronic pain management 05/10/2016  . Headache 03/16/2016  . Obesity (BMI 30.0-34.9) 03/17/2015  . Essential hypertension, benign 01/07/2014  . Cervical neck pain with evidence of disc disease 10/02/2013  . Low back pain with radiation 07/30/2013  . 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 BV (bacterial vaginosis) 05/19/2011  . Vitamin D deficiency 10/27/2010  . Anxiety 10/26/2010    Past Surgical History:  Procedure Laterality Date  . cervical cryotherapy N/A 1999  . DILITATION & CURRETTAGE/HYSTROSCOPY WITH THERMACHOICE ABLATION  08/26/2013   Procedure: DILATATION & CURETTAGE/HYSTEROSCOPY WITH THERMACHOICE ENDOMETRIAL ABLATION Procedure #2 Total Therapy Time=min       sec;  Surgeon: Jonnie Kind, MD;  Location: [facility];  Service: Gynecology;;  . LAPAROSCOPIC BILATERAL SALPINGECTOMY Bilateral 08/26/2013   Procedure: LAPAROSCOPIC  BILATERAL SALPINGECTOMY AND REMOVAL OF LEFT PERITUBAL CYST Procedure #1;  Surgeon: Jonnie Kind, MD;  Location: [facility];  Service: Gynecology;  Laterality: Bilateral;  . LAPAROSCOPIC LYSIS OF ADHESIONS  08/26/2013   Procedure: LAPAROSCOPIC LYSIS OF ADHESIONS Procedure #1;  Surgeon: Jonnie Kind, MD;  Location: [facility];  Service: Gynecology;;  . Carmelia Bake LESION REMOVAL  08/26/2013   Procedure: REMOVAL OF VULVAR SEBACEOUS CYST Procedure #3;  Surgeon: Jonnie Kind, MD;  Location: [facility];  Service: Gynecology;;     OB History   No obstetric history on file.      Home Medications    Prior to Admission medications   Medication Sig Start Date End Date Taking? Authorizing Provider  ALPRAZolam Duanne Moron) 1 MG tablet Take one tablet once daily at bedtime for anxiety and sleep 06/07/18   Fayrene Helper, MD  amLODipine (NORVASC) 2.5 MG tablet TAKE 1 TABLET BY MOUTH ONCE DAILY 01/10/18   Fayrene Helper, MD  benzonatate (TESSALON) 100 MG capsule Take 1 capsule (100 mg total) by mouth 2 (two) times daily as needed for cough. 07/11/18   Fayrene Helper, MD  clindamycin (CLEOCIN) 150 MG capsule Take 2 capsules (300 mg total) by mouth 4 (four) times daily. . 06/25/18   Rolland Porter, MD  Erenumab-aooe (AIMOVIG) 70 MG/ML SOAJ Inject 70 mg into the skin every 30 (thirty) days. 07/31/18   [name], MD  FLUoxetine (PROZAC) 20 MG capsule Take 1 capsule (20 mg total) by  mouth daily. 02/28/18 02/28/19  Cloria Spring, MD  ibuprofen (ADVIL) 600 MG tablet Take 1 tablet (600 mg total) by mouth every 6 (six) hours as needed for up to 5 days. 08/15/18 08/20/18  Alroy Bailiff, Ikenna Ohms, PA-C  metroNIDAZOLE (METROGEL VAGINAL) 0.75 % vaginal gel Place 1 Applicatorful vaginally 2 (two) times daily. 06/10/18   Fayrene Helper, MD  ondansetron (ZOFRAN ODT) 4 MG disintegrating tablet Take 1 tablet (4 mg total) by mouth every 8 (eight) hours as needed. 07/31/18   [name], MD  oxyCODONE-acetaminophen (PERCOCET/ROXICET) 5-325 MG  tablet Take one tablet once daily for back pain 07/28/18 08/27/18  Fayrene Helper, MD  QUEtiapine (SEROQUEL) 25 MG tablet Take 1 tablet (25 mg total) by mouth at bedtime. 02/28/18 02/28/19  Cloria Spring, MD  SUMAtriptan (IMITREX) 6 MG/0.5ML SOLN injection Use one injection at onset of migraine.  May repeat in 2 hrs, if needed.  Max dose: 2 inj/day. This is a 30 day prescription. 07/31/18   [name], MD  tiZANidine (ZANAFLEX) 4 MG tablet Take 1 tablet (4 mg total) by mouth every 6 (six) hours as needed for muscle spasms. 07/31/18   [name], MD  topiramate (TOPAMAX) 100 MG tablet Take 1 tablet (100 mg total) by mouth 2 (two) times daily. 07/31/18   [name], MD  triamcinolone cream (KENALOG) 0.1 % Apply to the skin neck down 1-2 times a day as needed for rash 09/25/17   [provider]    Family History Family History  Problem Relation Age of Onset  . Drug abuse Mother   . Hypertension Father   . Hypertension Maternal Grandmother   . Hypertension Maternal Grandfather   . Hypertension Paternal Grandmother     Social History Social History   Tobacco Use  . Smoking status: Former Smoker    Packs/day: 0.00    Types: Cigars    Last attempt to quit: 01/05/2017    Years since quitting: 1.6  . Smokeless tobacco: Never Used  Substance Use Topics  . Alcohol use: Yes    Comment: occasional  . Drug use: No     Allergies   Diphenhydramine hcl; Orange fruit; and Latex   Review of Systems Review of Systems  Musculoskeletal: Positive for arthralgias and joint swelling.  Skin: Negative for wound.  Neurological: Negative for weakness and numbness.     Physical Exam Updated Vital Signs BP (!) 152/107 (BP Location: Left Arm)   Pulse (!) 104   Temp 98.9 F (37.2 C) (Oral)   Resp 18   SpO2 100%   Physical Exam Vitals signs and nursing note reviewed.  Constitutional:      Appearance: She is not ill-appearing.  HENT:     Head: Normocephalic and atraumatic.  Eyes:      Conjunctiva/sclera: Conjunctivae normal.  Cardiovascular:     Rate and Rhythm: Normal rate and regular rhythm.  Pulmonary:     Effort: Pulmonary effort is normal.     Breath sounds: Normal breath sounds.  Musculoskeletal:       Feet:     Comments: No tenderness to lateral or medial malleolus with palpation. ROM of ankle limited due to pain. Cap refill < 2 seconds. Strength and sensation intact throughout   Skin:    General: Skin is warm and dry.     Coloration: Skin is not jaundiced.  Neurological:     Mental Status: She is alert.      ED Treatments / Results  Labs (  all labs ordered are listed, but only abnormal results are displayed) Labs Reviewed - No data to display  EKG None  Radiology Dg Foot Complete Left  Result Date: 08/15/2018 CLINICAL DATA:  The patient states left foot pain after tripping on a rock today. EXAM: LEFT FOOT - COMPLETE 3+ VIEW COMPARISON:  11/19/2017 FINDINGS: Small Achilles and calcaneal spurs. Mild midfoot degenerative change. Oblique fracture of the mid to distal shaft of the fifth metatarsal. No intra-articular extension. IMPRESSION: Fifth metatarsal shaft fracture. Electronically Signed   By: Abigail Miyamoto M.D.   On: 08/15/2018 20:05    Procedures Procedures (including critical care time)  Medications Ordered in ED Medications  ibuprofen (ADVIL) tablet 600 mg (600 mg Oral Given 08/15/18 1929)     Initial Impression / Assessment and Plan / ED Course  I have reviewed the triage vital signs and the nursing notes.  Pertinent labs & imaging results that were available during my care of the patient were reviewed by me and considered in my medical decision making (see chart for details).    Pt with isolated injury to left foot after tripping on rock. DG  L Foot ordered due to TTP along dorsum mostly 3rd-5th metatarsals. Ibuprofen 600 mg and ice pack given in the ED as well.   My interpretation of x ray shows oblique fracture to shaft of 5th  metatarsal; still awaiting official read. Will give pt cam walker and crutches with strict non weight bearing instructions and referral to ortho. She has seen Dr. Aline Brochure in the past; advised to call in the morning for follow up.  Sent home with prescription for 600 mg Ibuprofen; advised to take as needed for pain. RICE therapy also discussed with patient.  Pt in agreement with plan and stable for discharge home.       Final Clinical Impressions(s) / ED Diagnoses   Final diagnoses:  Closed nondisplaced fracture of fifth metatarsal bone of left foot, initial encounter    ED Discharge Orders         Ordered    ibuprofen (ADVIL) 600 MG tablet  Every 6 hours PRN     08/15/18 2010           Eustaquio Maize, PA-C 08/15/18 2120    Noemi Chapel, MD 08/19/18 1205

## 2018-08-15 NOTE — ED Provider Notes (Signed)
Medical screening examination/treatment/procedure(s) were conducted as a shared visit with non-physician practitioner(s) and myself.  I personally evaluated the patient during the encounter.  Clinical Impression:   Final diagnoses:  Closed nondisplaced fracture of fifth metatarsal bone of left foot, initial encounter    Had inversion injury just prior to arrival, of the L foot - has pain over lateral foot - no other injuries including no ankel pain - great ROM of the ankle - very ttp over the lasterl 5th MT, suspect frx  I personally interpreted the xray and find a 5th MT fracture - oblique / longitudinal.    immob and f/u with ortho - can go out on crutches and walking boot.   Noemi Chapel, MD 08/19/18 [phone]

## 2018-08-15 NOTE — Discharge Instructions (Addendum)
You were seen in the ED today for left foot pain and found to have a fracture. Please call Dr. Aline Brochure tomorrow morning for follow up. Do not bear weight on that foot until you are seen by him. Use crutches whenever you have to get up and walk anywhere. Use ibuprofen as needed for pain. You may also use ice and elevate your foot for comfort. Return to the ED for any worsening symptoms.

## 2018-08-15 NOTE — ED Notes (Signed)
Size small cam walker applied to pt's left foot and crutches set to height. Education on cam walker and crutches given. Pt has no questions at time of d/c

## 2018-08-15 NOTE — ED Triage Notes (Signed)
Pt C/o left foot pain after "tripping on a rock today." No deformity noted.

## 2018-08-16 ENCOUNTER — Telehealth: Payer: Self-pay | Admitting: Orthopedic Surgery

## 2018-08-16 NOTE — Telephone Encounter (Signed)
Patient was seen at [facility] ER 4/16 for a closed displaced fx of fifth metatarsal bone of L foot. She is wanting to be seen and I explained how office scheduling is being done due to the COVID-19. She was ok with waiting to hear back from our office when we get a recommendation from you.  Please advise

## 2018-08-16 NOTE — Telephone Encounter (Signed)
Rebecca Boyd  Ask her if she has a boot , if yes then virtual visit   If not in person visit week of 4/20

## 2018-08-21 ENCOUNTER — Encounter: Payer: Self-pay | Admitting: Family Medicine

## 2018-08-21 ENCOUNTER — Ambulatory Visit (INDEPENDENT_AMBULATORY_CARE_PROVIDER_SITE_OTHER): Payer: BLUE CROSS/BLUE SHIELD | Admitting: Orthopedic Surgery

## 2018-08-21 ENCOUNTER — Other Ambulatory Visit: Payer: Self-pay

## 2018-08-21 ENCOUNTER — Ambulatory Visit (INDEPENDENT_AMBULATORY_CARE_PROVIDER_SITE_OTHER): Payer: BLUE CROSS/BLUE SHIELD | Admitting: Family Medicine

## 2018-08-21 VITALS — BP 110/80 | HR 70 | Ht 60.0 in | Wt 162.0 lb

## 2018-08-21 DIAGNOSIS — I10 Essential (primary) hypertension: Secondary | ICD-10-CM

## 2018-08-21 DIAGNOSIS — S92355A Nondisplaced fracture of fifth metatarsal bone, left foot, initial encounter for closed fracture: Secondary | ICD-10-CM

## 2018-08-21 DIAGNOSIS — G8929 Other chronic pain: Secondary | ICD-10-CM

## 2018-08-21 DIAGNOSIS — E669 Obesity, unspecified: Secondary | ICD-10-CM | POA: Diagnosis not present

## 2018-08-21 MED ORDER — OXYCODONE-ACETAMINOPHEN 5-325 MG PO TABS: ORAL_TABLET | ORAL | 0 refills | Status: AC

## 2018-08-21 MED ORDER — OXYCODONE-ACETAMINOPHEN 5-325 MG PO TABS: ORAL_TABLET | ORAL | 0 refills | Status: DC

## 2018-08-21 NOTE — Patient Instructions (Signed)
Recommend nonweightbearing in the boot until April 30 I will be 2 weeks post injury She can then try weightbearing as tolerated with boot and crutches permitted to remove the crutches if stable  Out of work until May 28  Office visit in person x-ray May 27

## 2018-08-21 NOTE — Progress Notes (Signed)
NEW PROBLEM OFFICE VISIT  Chief Complaint  Patient presents with  . Foot Pain    left foot pain     43 year old female virtual visit evaluation for left foot fracture  She is 43 years old she last worked on the 23rd.  Her job shutdown secondary COVID-19.  On April 16 she twisted her left foot and fractured it.  She went to the ER she had x-ray she had a midshaft fracture fifth metatarsal nondisplaced he was placed in a cam walker nonweightbearing with crutches  She was on oxycodone for back pain and she was also given some ibuprofen  Pain level is 7-8 out of 10 can go up to a 10 at times Dull pain Nonradiating pain     Review of Systems  Constitutional: Negative for chills and fever.  Musculoskeletal: Positive for back pain.  Neurological: Negative for tingling.     Past Medical History:  Diagnosis Date  . Anemia   . Anxiety   . Depressive disorder, not elsewhere classified   . Hypertension   . Migraine   . Migraines   . Miscarriage 2009  . Neck pain   . Vaginitis    cyctitis    Past Surgical History:  Procedure Laterality Date  . cervical cryotherapy N/A 1999  . DILITATION & CURRETTAGE/HYSTROSCOPY WITH THERMACHOICE ABLATION  08/26/2013   Procedure: DILATATION & CURETTAGE/HYSTEROSCOPY WITH THERMACHOICE ENDOMETRIAL ABLATION Procedure #2 Total Therapy Time=min       sec;  Surgeon: Jonnie Kind, MD;  Location: [facility];  Service: Gynecology;;  . LAPAROSCOPIC BILATERAL SALPINGECTOMY Bilateral 08/26/2013   Procedure: LAPAROSCOPIC BILATERAL SALPINGECTOMY AND REMOVAL OF LEFT PERITUBAL CYST Procedure #1;  Surgeon: Jonnie Kind, MD;  Location: [facility];  Service: Gynecology;  Laterality: Bilateral;  . LAPAROSCOPIC LYSIS OF ADHESIONS  08/26/2013   Procedure: LAPAROSCOPIC LYSIS OF ADHESIONS Procedure #1;  Surgeon: Jonnie Kind, MD;  Location: [facility];  Service: Gynecology;;  . Carmelia Bake LESION REMOVAL  08/26/2013   Procedure: REMOVAL OF VULVAR SEBACEOUS CYST Procedure #3;   Surgeon: Jonnie Kind, MD;  Location: [facility];  Service: Gynecology;;    Family History  Problem Relation Age of Onset  . Drug abuse Mother   . Hypertension Father   . Hypertension Maternal Grandmother   . Hypertension Maternal Grandfather   . Hypertension Paternal Grandmother    Social History   Tobacco Use  . Smoking status: Former Smoker    Packs/day: 0.00    Types: Cigars    Last attempt to quit: 01/05/2017    Years since quitting: 1.6  . Smokeless tobacco: Never Used  Substance Use Topics  . Alcohol use: Yes    Comment: occasional  . Drug use: No    Allergies  Allergen Reactions  . Diphenhydramine Hcl Anaphylaxis    (Benadryl)  . Orange Fruit Hives, Shortness Of Breath, Itching and Other (See Comments)    MAKES IT DIFFICULT TO BREATH  . Latex Rash    Current Meds  Medication Sig  . ALPRAZolam (XANAX) 1 MG tablet Take one tablet once daily at bedtime for anxiety and sleep  . amLODipine (NORVASC) 2.5 MG tablet TAKE 1 TABLET BY MOUTH ONCE DAILY  . benzonatate (TESSALON) 100 MG capsule Take 1 capsule (100 mg total) by mouth 2 (two) times daily as needed for cough.  Eduard Roux (AIMOVIG) 70 MG/ML SOAJ Inject 70 mg into the skin every 30 (thirty) days.  Marland Kitchen FLUoxetine (PROZAC) 20 MG capsule Take 1 capsule (20  mg total) by mouth daily.  . metroNIDAZOLE (METROGEL VAGINAL) 0.75 % vaginal gel Place 1 Applicatorful vaginally 2 (two) times daily.  . ondansetron (ZOFRAN ODT) 4 MG disintegrating tablet Take 1 tablet (4 mg total) by mouth every 8 (eight) hours as needed.  Marland Kitchen oxyCODONE-acetaminophen (PERCOCET/ROXICET) 5-325 MG tablet Take one tablet once daily for back pain  . QUEtiapine (SEROQUEL) 25 MG tablet Take 1 tablet (25 mg total) by mouth at bedtime.  . SUMAtriptan (IMITREX) 6 MG/0.5ML SOLN injection Use one injection at onset of migraine.  May repeat in 2 hrs, if needed.  Max dose: 2 inj/day. This is a 30 day prescription.  Marland Kitchen tiZANidine (ZANAFLEX) 4 MG tablet Take 1  tablet (4 mg total) by mouth every 6 (six) hours as needed for muscle spasms.  Marland Kitchen topiramate (TOPAMAX) 100 MG tablet Take 1 tablet (100 mg total) by mouth 2 (two) times daily.  Marland Kitchen triamcinolone cream (KENALOG) 0.1 % Apply to the skin neck down 1-2 times a day as needed for rash     MEDICAL DECISION SECTION  Xrays were done at CLINICAL DATA:  The patient states left foot pain after tripping on a rock today.   EXAM: LEFT FOOT - COMPLETE 3+ VIEW   COMPARISON:  11/19/2017   FINDINGS: Small Achilles and calcaneal spurs. Mild midfoot degenerative change. Oblique fracture of the mid to distal shaft of the fifth metatarsal. No intra-articular extension.   IMPRESSION: Fifth metatarsal shaft fracture.     Electronically Signed   By: Abigail Miyamoto M.D.   On: 08/15/2018 20:05   My independent reading of xrays:  I see a non-displaced fracture 5th metatarsal left foot.   Encounter Diagnosis  Name Primary?  . Closed nondisplaced fracture of fifth metatarsal bone of left foot, initial encounter Yes    PLAN: (Rx., injectx, surgery, frx, mri/ct) Recommend nonweightbearing in the boot until April 30 I will be 2 weeks post injury She can then try weightbearing as tolerated with boot and crutches permitted to remove the crutches if stable  Out of work until May 28  Office visit in person x-ray May 27  Obtain oxycodone prescription from Dr. Moshe Cipro to have continuity of 1 provider for opioids  Virtual Visit via Telephone Note  I connected with [name] on 08/21/18 at  1:30 PM EDT by telephone and verified that I am speaking with the correct person using two identifiers.   I discussed the limitations, risks, security and privacy concerns of performing an evaluation and management service by telephone and the availability of in person appointments. I also discussed with the patient that there may be a patient responsible charge related to this service. The patient expressed  understanding and agreed to proceed.   I discussed the assessment and treatment plan with the patient. The patient was provided an opportunity to ask questions and all were answered. The patient agreed with the plan and demonstrated an understanding of the instructions.   The patient was advised to call back or seek an in-person evaluation if the symptoms worsen or if the condition fails to improve as anticipated.  I provided 12 minutes of non-face-to-face time during this encounter.   Arther Abbott, MD    No orders of the defined types were placed in this encounter.    Arther Abbott, MD  08/21/2018 1:48 PM

## 2018-08-21 NOTE — Patient Instructions (Signed)
F/u in 12 weeks , call if you need me before   Please be careful re futher fractures /injury  No changes in your medication , which is prescribed   Thanks for choosing [facility], we consider it a privelige to serve you.   Think about what you will eat, plan ahead. Choose " clean, green, fresh or frozen" over canned, processed or packaged foods which are more sugary, salty and fatty. 70 to 75% of food eaten should be vegetables and fruit. Three meals at set times with snacks allowed between meals, but they must be fruit or vegetables. Aim to eat over a 12 hour period , example 7 am to 7 pm, and STOP after  your last meal of the day. Drink water,generally about 64 ounces per day, no other drink is as healthy. Fruit juice is best enjoyed in a healthy way, by EATING the fruit.   Social distancing. Frequent hand washing with soap and water Keeping your hands off of your face.and wearing a face mask when you go out These 3 practices will help to keep both you and your community healthy during this time. Please practice them faithfully!

## 2018-08-23 ENCOUNTER — Encounter: Payer: Self-pay | Admitting: Orthopedic Surgery

## 2018-08-24 ENCOUNTER — Encounter: Payer: Self-pay | Admitting: Family Medicine

## 2018-08-24 NOTE — Assessment & Plan Note (Signed)
Controlled, no change in medication DASH diet and commitment to daily physical activity for a minimum of 30 minutes discussed and encouraged, as a part of hypertension management. The importance of attaining a healthy weight is also discussed.  BP/Weight 08/21/2018 08/15/2018 06/25/2018 06/10/2018 05/29/2018 05/14/2018 58/52/7782  Systolic BP 423 536 144 315 400 867 619  Diastolic BP 80 509 90 80 84 84 85  Wt. (Lbs) 162 - - 162 161 159.12 159.08  BMI 31.64 - - 31.64 31.44 31.08 31.07  Some encounter information is confidential and restricted. Go to Review Flowsheets activity to see all data.

## 2018-08-24 NOTE — Progress Notes (Signed)
Virtual Visit via Telephone Note  I connected with Rebecca Boyd on 08/24/18 at  4:00 PM EDT by telephone and verified that I am speaking with the correct person using two identifiers.   I discussed the limitations, risks, security and privacy concerns of performing an evaluation and management service by telephone and the availability of in person appointments. I also discussed with the patient that there may be a patient responsible charge related to this service. The patient expressed understanding and agreed to proceed.Video visit attempt is unsuccessful   History of Present Illness: Scheduled f/u for chronic pain management. New problem is a fracture of the left foot 1 week ago, now out of work for several weeks per Ortho, sustained when she was using a jump rope ROS" otherwise negative   Observations/Objective: BP 110/80   Pulse 70   Ht 5' (1.524 m)   Wt 162 lb (73.5 kg)   BMI 31.64 kg/m    Assessment and Plan: Encounter for chronic pain management The patient's Controlled Substance registry is reviewed and compliance confirmed. Adequacy of  Pain control and level of function is assessed. Medication dosing is adjusted as deemed appropriate. Twelve weeks of medication is prescribed , patient signs for the script and is provided with a follow up appointment between 11 to 12 weeks .   Essential hypertension, benign Controlled, no change in medication DASH diet and commitment to daily physical activity for a minimum of 30 minutes discussed and encouraged, as a part of hypertension management. The importance of attaining a healthy weight is also discussed.  BP/Weight 08/21/2018 08/15/2018 06/25/2018 06/10/2018 05/29/2018 05/14/2018 97/41/6384  Systolic BP 536 468 032 122 482 500 370  Diastolic BP 80 488 90 80 84 84 85  Wt. (Lbs) 162 - - 162 161 159.12 159.08  BMI 31.64 - - 31.64 31.44 31.08 31.07  Some encounter information is confidential and restricted. Go to Review Flowsheets  activity to see all data.       Obesity (BMI 30.0-34.9)   Patient re-educated about  the importance of commitment to a  minimum of 150 minutes of exercise per week as able.  The importance of healthy food choices with portion control discussed, as well as eating regularly and within a 12 hour window most days. The need to choose "clean , green" food 50 to 75% of the time is discussed, as well as to make water the primary drink and set a goal of 64 ounces water daily.  Encouraged to start a food diary,  and to consider  joining a support group. Sample diet sheets offered. Goals set by the patient for the next several months.   Weight /BMI 08/21/2018 06/10/2018 05/29/2018  WEIGHT 162 lb 162 lb 161 lb  HEIGHT 5\' 0"  5\' 0"  5\' 0"   BMI 31.64 kg/m2 31.64 kg/m2 31.44 kg/m2  Some encounter information is confidential and restricted. Go to Review Flowsheets activity to see all data.        Follow Up Instructions:    I discussed the assessment and treatment plan with the patient. The patient was provided an opportunity to ask questions and all were answered. The patient agreed with the plan and demonstrated an understanding of the instructions.   The patient was advised to call back or seek an in-person evaluation if the symptoms worsen or if the condition fails to improve as anticipated.  I provided 15 minutes of non-face-to-face time during this encounter.   Tula Nakayama, MD

## 2018-08-24 NOTE — Assessment & Plan Note (Signed)
   Patient re-educated about  the importance of commitment to a  minimum of 150 minutes of exercise per week as able.  The importance of healthy food choices with portion control discussed, as well as eating regularly and within a 12 hour window most days. The need to choose "clean , green" food 50 to 75% of the time is discussed, as well as to make water the primary drink and set a goal of 64 ounces water daily.  Encouraged to start a food diary,  and to consider  joining a support group. Sample diet sheets offered. Goals set by the patient for the next several months.   Weight /BMI 08/21/2018 06/10/2018 05/29/2018  WEIGHT 162 lb 162 lb 161 lb  HEIGHT 5\' 0"  5\' 0"  5\' 0"   BMI 31.64 kg/m2 31.64 kg/m2 31.44 kg/m2  Some encounter information is confidential and restricted. Go to Review Flowsheets activity to see all data.

## 2018-08-24 NOTE — Assessment & Plan Note (Signed)
The patient's Controlled Substance registry is reviewed and compliance confirmed. Adequacy of  Pain control and level of function is assessed. Medication dosing is adjusted as deemed appropriate. Twelve weeks of medication is prescribed , patient signs for the script and is provided with a follow up appointment between 11 to 12 weeks .  

## 2018-09-02 ENCOUNTER — Ambulatory Visit (HOSPITAL_COMMUNITY): Payer: Self-pay

## 2018-09-11 ENCOUNTER — Telehealth: Payer: Self-pay | Admitting: Radiology

## 2018-09-11 NOTE — Telephone Encounter (Signed)
Patient called, said she does not need this statement.

## 2018-09-11 NOTE — Telephone Encounter (Signed)
Patient came into the office today and picked up her disability form. She is asking for some help at home with transferring/bathing and meals.  Can you provide a statement stating this?  Please call her to advise.

## 2018-09-13 ENCOUNTER — Other Ambulatory Visit: Payer: Self-pay

## 2018-09-13 ENCOUNTER — Ambulatory Visit (HOSPITAL_COMMUNITY)
Admission: RE | Admit: 2018-09-13 | Discharge: 2018-09-13 | Disposition: A | Discharge status: Home / Self Care | Payer: Self-pay | Source: Ambulatory Visit | Attending: Family Medicine | Admitting: Family Medicine

## 2018-09-13 DIAGNOSIS — Z803 Family history of malignant neoplasm of breast: Secondary | ICD-10-CM | POA: Insufficient documentation

## 2018-09-13 DIAGNOSIS — Z1231 Encounter for screening mammogram for malignant neoplasm of breast: Secondary | ICD-10-CM | POA: Insufficient documentation

## 2018-09-16 ENCOUNTER — Other Ambulatory Visit (HOSPITAL_COMMUNITY): Payer: Self-pay | Admitting: Family Medicine

## 2018-09-16 DIAGNOSIS — R928 Other abnormal and inconclusive findings on diagnostic imaging of breast: Secondary | ICD-10-CM

## 2018-09-17 ENCOUNTER — Ambulatory Visit (HOSPITAL_COMMUNITY)
Admission: RE | Admit: 2018-09-17 | Discharge: 2018-09-17 | Disposition: A | Discharge status: Home / Self Care | Payer: Self-pay | Source: Ambulatory Visit | Attending: Family Medicine | Admitting: Family Medicine

## 2018-09-17 ENCOUNTER — Other Ambulatory Visit: Payer: Self-pay

## 2018-09-17 ENCOUNTER — Encounter (HOSPITAL_COMMUNITY): Payer: Self-pay

## 2018-09-17 DIAGNOSIS — R928 Other abnormal and inconclusive findings on diagnostic imaging of breast: Secondary | ICD-10-CM | POA: Insufficient documentation

## 2018-09-18 ENCOUNTER — Encounter (HOSPITAL_COMMUNITY): Payer: Self-pay | Admitting: Emergency Medicine

## 2018-09-18 ENCOUNTER — Emergency Department (HOSPITAL_COMMUNITY)
Admission: EM | Admit: 2018-09-18 | Discharge: 2018-09-18 | Disposition: A | Discharge status: Home / Self Care | Payer: Self-pay | Attending: Emergency Medicine | Admitting: Emergency Medicine

## 2018-09-18 ENCOUNTER — Other Ambulatory Visit: Payer: Self-pay

## 2018-09-18 DIAGNOSIS — Z87891 Personal history of nicotine dependence: Secondary | ICD-10-CM | POA: Insufficient documentation

## 2018-09-18 DIAGNOSIS — Z23 Encounter for immunization: Secondary | ICD-10-CM | POA: Insufficient documentation

## 2018-09-18 DIAGNOSIS — Y929 Unspecified place or not applicable: Secondary | ICD-10-CM | POA: Insufficient documentation

## 2018-09-18 DIAGNOSIS — I10 Essential (primary) hypertension: Secondary | ICD-10-CM | POA: Insufficient documentation

## 2018-09-18 DIAGNOSIS — S30861A Insect bite (nonvenomous) of abdominal wall, initial encounter: Secondary | ICD-10-CM | POA: Diagnosis not present

## 2018-09-18 DIAGNOSIS — L089 Local infection of the skin and subcutaneous tissue, unspecified: Secondary | ICD-10-CM | POA: Insufficient documentation

## 2018-09-18 DIAGNOSIS — Y939 Activity, unspecified: Secondary | ICD-10-CM | POA: Insufficient documentation

## 2018-09-18 DIAGNOSIS — W57XXXA Bitten or stung by nonvenomous insect and other nonvenomous arthropods, initial encounter: Secondary | ICD-10-CM | POA: Insufficient documentation

## 2018-09-18 DIAGNOSIS — Z91040 Latex allergy status: Secondary | ICD-10-CM | POA: Insufficient documentation

## 2018-09-18 DIAGNOSIS — Y999 Unspecified external cause status: Secondary | ICD-10-CM | POA: Insufficient documentation

## 2018-09-18 MED ORDER — DOXYCYCLINE HYCLATE 100 MG PO CAPS: 100.0000 mg | ORAL_CAPSULE | Freq: Two times a day (BID) | ORAL | 0 refills | Status: DC

## 2018-09-18 MED ORDER — DOXYCYCLINE HYCLATE 100 MG PO TABS
100.0000 mg | ORAL_TABLET | Freq: Once | ORAL | Status: AC
  Administered 2018-09-18: 100 mg via ORAL
  Filled 2018-09-18: qty 1

## 2018-09-18 MED ORDER — TETANUS-DIPHTH-ACELL PERTUSSIS 5-2.5-18.5 LF-MCG/0.5 IM SUSP
0.5000 mL | Freq: Once | INTRAMUSCULAR | Status: AC
  Administered 2018-09-18: 0.5 mL via INTRAMUSCULAR
  Filled 2018-09-18: qty 0.5

## 2018-09-18 MED ORDER — LIDOCAINE HCL (PF) 1 % IJ SOLN
5.0000 mL | Freq: Once | INTRAMUSCULAR | Status: AC
  Administered 2018-09-18: 5 mL
  Filled 2018-09-18: qty 6

## 2018-09-18 NOTE — Discharge Instructions (Addendum)
As discussed, there does not appear to be any retained tick remnants at this time. You are being treated with antibiotics given your significant exposure. Take the entire course of the antibiotics prescribed.  Your tetanus has been updated as well.

## 2018-09-18 NOTE — ED Triage Notes (Signed)
Pt reports she attempted to remove a tick today from her left hip with tweezers and part of the tick is still attached and she is unable to get it out, pt reports 2 additional bug bites to right leg that itch

## 2018-09-19 NOTE — ED Provider Notes (Signed)
[facility] EMERGENCY DEPARTMENT Provider Note   CSN: [id number] Arrival date & time: 09/18/18  2050    History   Chief Complaint Chief Complaint  Patient presents with  . Insect Bite    HPI Rebecca Boyd is a 43 y.o. female presenting with suspected retained tick remnant in her left lower abdomen.  She discovered a deeply embedded tick and had a family member pull it out using a tweezers, but states it broke off leaving part of the insect embedded.  She denies fevers or chills, no n/v, rash or redness. She does endorse itching at the site.  Also notes several itchy insect bites on her legs but no other known tick exposures.      The history is provided by the patient.    Past Medical History:  Diagnosis Date  . Anemia   . Anxiety   . Depressive disorder, not elsewhere classified   . Hypertension   . Migraine   . Migraines   . Miscarriage 2009  . Neck pain   . Vaginitis    cyctitis    Patient Active Problem List   Diagnosis Date Noted  . FH: breast cancer in relative when <81 years old 06/10/2018  . Major depression with psychotic features ([place]) 12/02/2017  . Rash and nonspecific skin eruption 05/14/2017  . Chronic migraine 12/06/2016  . Encounter for chronic pain management 05/10/2016  . Headache 03/16/2016  . Obesity (BMI 30.0-34.9) 03/17/2015  . Essential hypertension, benign 01/07/2014  . Cervical neck pain with evidence of disc disease 10/02/2013  . Low back pain with radiation 07/30/2013  . 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 Vitamin D deficiency 10/27/2010  . Anxiety 10/26/2010    Past Surgical History:  Procedure Laterality Date  . cervical cryotherapy N/A 1999  . DILITATION & CURRETTAGE/HYSTROSCOPY WITH THERMACHOICE ABLATION  08/26/2013   Procedure: DILATATION & CURETTAGE/HYSTEROSCOPY WITH THERMACHOICE ENDOMETRIAL ABLATION Procedure #2 Total Therapy Time=min       sec;  Surgeon: Jonnie Kind, MD;  Location: [facility];  Service: Gynecology;;  .  LAPAROSCOPIC BILATERAL SALPINGECTOMY Bilateral 08/26/2013   Procedure: LAPAROSCOPIC BILATERAL SALPINGECTOMY AND REMOVAL OF LEFT PERITUBAL CYST Procedure #1;  Surgeon: Jonnie Kind, MD;  Location: [facility];  Service: Gynecology;  Laterality: Bilateral;  . LAPAROSCOPIC LYSIS OF ADHESIONS  08/26/2013   Procedure: LAPAROSCOPIC LYSIS OF ADHESIONS Procedure #1;  Surgeon: Jonnie Kind, MD;  Location: [facility];  Service: Gynecology;;  . Carmelia Bake LESION REMOVAL  08/26/2013   Procedure: REMOVAL OF VULVAR SEBACEOUS CYST Procedure #3;  Surgeon: Jonnie Kind, MD;  Location: [facility];  Service: Gynecology;;     OB History   No obstetric history on file.      Home Medications    Prior to Admission medications   Medication Sig Start Date End Date Taking? Authorizing Provider  ALPRAZolam Duanne Moron) 1 MG tablet Take one tablet once daily at bedtime for anxiety and sleep 06/07/18   Fayrene Helper, MD  amLODipine (NORVASC) 2.5 MG tablet TAKE 1 TABLET BY MOUTH ONCE DAILY 01/10/18   Fayrene Helper, MD  doxycycline (VIBRAMYCIN) 100 MG capsule Take 1 capsule (100 mg total) by mouth 2 (two) times daily. 09/18/18   Croy Drumwright, Almyra Free, PA-C  Erenumab-aooe (AIMOVIG) 70 MG/ML SOAJ Inject 70 mg into the skin every 30 (thirty) days. 07/31/18   [name], MD  FLUoxetine (PROZAC) 20 MG capsule Take 1 capsule (20 mg total) by mouth daily. 02/28/18 02/28/19  Cloria Spring, MD  oxyCODONE-acetaminophen (PERCOCET/ROXICET) 5-325 MG tablet Take one tablet once daily for back pain , by mouth 08/27/18 09/26/18  Fayrene Helper, MD  oxyCODONE-acetaminophen (PERCOCET/ROXICET) 5-325 MG tablet Take one tablet once daily for back pain 09/26/18 10/26/18  Fayrene Helper, MD  oxyCODONE-acetaminophen (PERCOCET/ROXICET) 5-325 MG tablet Take one tablet once daily for back pain, by mouth 10/26/18 11/25/18  Fayrene Helper, MD  QUEtiapine (SEROQUEL) 25 MG tablet Take 1 tablet (25 mg total) by mouth at bedtime. 02/28/18 02/28/19  Cloria Spring, MD  SUMAtriptan (IMITREX) 6 MG/0.5ML SOLN injection Use one injection at onset of migraine.  May repeat in 2 hrs, if needed.  Max dose: 2 inj/day. This is a 30 day prescription. 07/31/18   [name], MD  tiZANidine (ZANAFLEX) 4 MG tablet Take 1 tablet (4 mg total) by mouth every 6 (six) hours as needed for muscle spasms. 07/31/18   [name], MD  topiramate (TOPAMAX) 100 MG tablet Take 1 tablet (100 mg total) by mouth 2 (two) times daily. 07/31/18   [name], MD  triamcinolone cream (KENALOG) 0.1 % Apply to the skin neck down 1-2 times a day as needed for rash 09/25/17   [provider]    Family History Family History  Problem Relation Age of Onset  . Drug abuse Mother   . Hypertension Father   . Breast cancer Sister   . Hypertension Maternal Grandmother   . Hypertension Maternal Grandfather   . Hypertension Paternal Grandmother     Social History Social History   Tobacco Use  . Smoking status: Former Smoker    Packs/day: 0.00    Types: Cigars    Last attempt to quit: 01/05/2017    Years since quitting: 1.7  . Smokeless tobacco: Never Used  Substance Use Topics  . Alcohol use: Yes    Comment: occasional  . Drug use: No     Allergies   Diphenhydramine hcl; Orange fruit; and Latex   Review of Systems Review of Systems  Constitutional: Negative for chills and fever.  Respiratory: Negative for shortness of breath and wheezing.   Gastrointestinal: Negative.   Skin: Positive for wound. Negative for color change and rash.  Neurological: Negative for numbness.     Physical Exam Updated Vital Signs BP (!) 137/92 (BP Location: Left Arm)   Pulse 81   Temp 98.7 F (37.1 C) (Oral)   Resp 17   Ht 5' (1.524 m)   Wt 71.2 kg   LMP 08/30/2018   SpO2 97%   BMI 30.66 kg/m   Physical Exam Constitutional:      General: She is not in acute distress.    Appearance: She is well-developed.  HENT:     Head: Normocephalic.  Neck:     Musculoskeletal: Neck supple.   Cardiovascular:     Rate and Rhythm: Normal rate.  Pulmonary:     Effort: Pulmonary effort is normal.     Breath sounds: No wheezing.  Musculoskeletal: Normal range of motion.  Skin:    Comments: Raised papule left lower abdomen with a dark appearing central punctum, no obvious foreign body.  No red streaking, no drainage, induration and no bullseye lesion.   She has 2 separate raised lesions/insect bites on her left lower leg, one with a small raised pustule which expressed easily, no surrounding induration, approx 0.5 cm diameter lateral calf, the second at her lateral left distal thigh, not pustular, similar size, induration.       ED  Treatments / Results  Labs (all labs ordered are listed, but only abnormal results are displayed) Labs Reviewed - No data to display  EKG None  Radiology   Procedures .Foreign Body Removal Date/Time: 09/19/2018 12:10 AM Performed by: Evalee Jefferson, PA-C Authorized by: Evalee Jefferson, PA-C  Consent: Verbal consent obtained. Consent given by: patient Patient understanding: patient states understanding of the procedure being performed Patient identity confirmed: verbally with patient Time out: Immediately prior to procedure a "time out" was called to verify the correct patient, procedure, equipment, support staff and site/side marked as required. Body area: skin Anesthesia: local infiltration  Anesthesia: Local Anesthetic: lidocaine 1% without epinephrine Anesthetic total: 3 mL Localization method: visualized Removal mechanism: scalpel, irrigation and forceps Complexity: simple 0 objects recovered. Objects recovered: no retained foreign body found.   Patient tolerance: Patient tolerated the procedure well with no immediate complications Comments: Site opened adequately with no foreign body found.    (including critical care time)    Medications Ordered in ED Medications  lidocaine (PF) (XYLOCAINE) 1 % injection 5 mL (5 mLs Other Given by  Other 09/18/18 2229)  Tdap (BOOSTRIX) injection 0.5 mL (0.5 mLs Intramuscular Given 09/18/18 2314)  doxycycline (VIBRA-TABS) tablet 100 mg (100 mg Oral Given 09/18/18 2315)     Initial Impression / Assessment and Plan / ED Course  I have reviewed the triage vital signs and the nursing notes.  Pertinent labs & imaging results that were available during my care of the patient were reviewed by me and considered in my medical decision making (see chart for details).        Pt was given updated tetanus as she is more than 10 years overdue. Started on doxycycline given significant tick exposure and 2 other skin infections/bites on left leg.  Final Clinical Impressions(s) / ED Diagnoses   Final diagnoses:  Tick bite, initial encounter  Bug bite with infection, initial encounter    ED Discharge Orders         Ordered    doxycycline (VIBRAMYCIN) 100 MG capsule  2 times daily     09/18/18 2312           Evalee Jefferson, PA-C 09/19/18 0019    Fredia Sorrow, MD 09/21/18 [phone]

## 2018-09-20 ENCOUNTER — Telehealth: Payer: Self-pay

## 2018-09-20 ENCOUNTER — Other Ambulatory Visit: Payer: Self-pay

## 2018-09-20 MED ORDER — PREDNISONE 5 MG (21) PO TBPK: ORAL_TABLET | ORAL | 0 refills | Status: DC

## 2018-09-20 NOTE — Telephone Encounter (Signed)
Called in prednisone dose pack #21 per verbal by Dr Moshe Cipro. Pt aware

## 2018-09-20 NOTE — Telephone Encounter (Signed)
Had a tick head removed from her side and given doxycycline and she is c/o 3 bites that have popped up around the area that itch very badly and she is allergic to benadryl and hydrocortisone cream not helping. Please advise what she can do for it

## 2018-09-21 NOTE — Telephone Encounter (Signed)
Noted, thank you

## 2018-09-25 ENCOUNTER — Encounter: Payer: Self-pay | Admitting: Orthopedic Surgery

## 2018-09-25 ENCOUNTER — Ambulatory Visit (INDEPENDENT_AMBULATORY_CARE_PROVIDER_SITE_OTHER): Payer: BLUE CROSS/BLUE SHIELD | Admitting: Orthopedic Surgery

## 2018-09-25 ENCOUNTER — Ambulatory Visit (INDEPENDENT_AMBULATORY_CARE_PROVIDER_SITE_OTHER): Payer: Self-pay | Admitting: Family Medicine

## 2018-09-25 ENCOUNTER — Other Ambulatory Visit: Payer: Self-pay

## 2018-09-25 ENCOUNTER — Ambulatory Visit (INDEPENDENT_AMBULATORY_CARE_PROVIDER_SITE_OTHER): Payer: Self-pay

## 2018-09-25 ENCOUNTER — Encounter: Payer: Self-pay | Admitting: Family Medicine

## 2018-09-25 VITALS — Temp 97.4°F | Ht 60.0 in | Wt 168.0 lb

## 2018-09-25 VITALS — BP 110/62 | HR 98 | Temp 97.8°F | Resp 16 | Ht 60.0 in | Wt 165.0 lb

## 2018-09-25 DIAGNOSIS — F323 Major depressive disorder, single episode, severe with psychotic features: Secondary | ICD-10-CM

## 2018-09-25 DIAGNOSIS — B354 Tinea corporis: Secondary | ICD-10-CM | POA: Insufficient documentation

## 2018-09-25 DIAGNOSIS — L290 Pruritus ani: Secondary | ICD-10-CM

## 2018-09-25 DIAGNOSIS — S92355D Nondisplaced fracture of fifth metatarsal bone, left foot, subsequent encounter for fracture with routine healing: Secondary | ICD-10-CM

## 2018-09-25 DIAGNOSIS — I10 Essential (primary) hypertension: Secondary | ICD-10-CM

## 2018-09-25 DIAGNOSIS — Z803 Family history of malignant neoplasm of breast: Secondary | ICD-10-CM

## 2018-09-25 DIAGNOSIS — L299 Pruritus, unspecified: Secondary | ICD-10-CM

## 2018-09-25 HISTORY — DX: Nondisplaced fracture of fifth metatarsal bone, left foot, subsequent encounter for fracture with routine healing: S92.355D

## 2018-09-25 HISTORY — DX: Pruritus ani: L29.0

## 2018-09-25 HISTORY — DX: Family history of malignant neoplasm of breast: Z80.3

## 2018-09-25 MED ORDER — HYDROXYZINE HCL 25 MG PO TABS: 25.0000 mg | ORAL_TABLET | Freq: Three times a day (TID) | ORAL | 0 refills | Status: DC | PRN

## 2018-09-25 MED ORDER — TERBINAFINE HCL 250 MG PO TABS: 250.0000 mg | ORAL_TABLET | Freq: Every day | ORAL | 0 refills | Status: DC

## 2018-09-25 NOTE — Progress Notes (Signed)
Patient ID: Rebecca Boyd, female   DOB: Mar 03, 1976, 43 y.o.   MRN: [id number]  FRACTURE CARE   Chief Complaint  Patient presents with  . Foot Injury    April 16th 2020 injury date has pain with WB     Encounter Diagnosis  Name Primary?  . Nondisplaced fracture of fifth metatarsal bone, left foot, subsequent encounter for fracture with routine healing 08/15/18 Yes    CURRENT TREATMENT : Cam walker weight-bear as tolerated  POST INJURY DAY: 58  GLOBAL PERIOD DAY 36/90  She still has pain in the foot over the fracture  X-ray shows no displacement but fracture line still visible  Recommend follow-up in 3 weeks for clinical exam.  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

## 2018-09-25 NOTE — Patient Instructions (Signed)
F/U as before , call if you need me before  Hydroxyzine  Is prescribed for itching and a 2 week course of antifungal also.  Use pure vaseline where skin itches most  You are referred to Oncology , for consultation based on your family history  Thanks for choosing [facility], we consider it a privelige to serve you.

## 2018-09-25 NOTE — Patient Instructions (Addendum)
Cam walker 3 weeks   OOW 3 WEEKS

## 2018-09-25 NOTE — Assessment & Plan Note (Signed)
Only sister dx with breast cancer at age 43, need to refer to oncology, also possibility that Mother has h/o lung cancer however not clear

## 2018-09-29 DIAGNOSIS — L299 Pruritus, unspecified: Secondary | ICD-10-CM | POA: Insufficient documentation

## 2018-09-29 HISTORY — DX: Pruritus, unspecified: L29.9

## 2018-09-29 NOTE — Assessment & Plan Note (Signed)
Improved and treated by psychiatry

## 2018-09-29 NOTE — Assessment & Plan Note (Signed)
2 week course of terbinafine and hydroxyzine for itch

## 2018-09-29 NOTE — Assessment & Plan Note (Signed)
Hydroxyzine prescribed.

## 2018-09-29 NOTE — Assessment & Plan Note (Signed)
Fungal rash between buttock, topical and oral medication prescribed

## 2018-09-29 NOTE — Progress Notes (Signed)
   Rebecca Boyd     MRN: [id number]      DOB: [birth year]   HPI [name] is here for follow up and re-evaluation following ED visit 1 week ago for removal of embedded tick Has had a course of prednisone and antibiotic course. Main c/o ongoing itching , and several other bites Denies fever , chills or purulent dranage   ROS Denies recent fever or chills. Denies sinus pressure, nasal congestion, ear pain or sore throat. Denies chest congestion, productive cough or wheezing. Denies chest pains, palpitations and leg swelling Denies abdominal pain, nausea, vomiting,diarrhea or constipation.   Denies dysuria, frequency, hesitancy or incontinence. Denies joint pain, swelling and limitation in mobility. Denies headaches, seizures, numbness, or tingling. Denies depression, anxiety or insomnia. F/h breast cancer, sister diagnosed under age 43  PE  BP 110/62   Pulse 98   Temp 97.8 F (36.6 C) (Temporal)   Resp 16   Ht 5' (1.524 m)   Wt 165 lb (74.8 kg)   LMP 08/30/2018 (Exact Date)   SpO2 98%   BMI 32.22 kg/m   Patient alert and oriented and in no cardiopulmonary distress.  HEENT: No facial asymmetry, EOMI,   oropharynx pink and moist.  Neck supple no JVD, no mass.  Chest: Clear to auscultation bilaterally.  CVS: S1, S2 no murmurs, no S3.Regular rate.  ABD: Soft non tender.   Ext: No edema  MS: Adequate ROM spine, shoulders, hips and knees.  Skin: hyperpigmented bug  Bites on extremities, and fungal rash in buttock  Psych: Good eye contact, normal affect. Memory intact not anxious or depressed appearing.  CNS: CN 2-12 intact, power,  normal throughout.no focal deficits noted.   Assessment & Plan  FH: breast cancer in relative when <21 years old Only sister dx with breast cancer at age 43, need to refer to oncology, also possibility that Mother has h/o lung cancer however not clear  Major depression with psychotic features ([place]) Improved and treated by  psychiatry  Pruritus ani Fungal rash between buttock, topical and oral medication prescribed  Tinea corporis 2 week course of terbinafine and hydroxyzine for itch  Pruritus Hydroxyzine prescribed  Essential hypertension, benign Controlled, no change in medication DASH diet and commitment to daily physical activity for a minimum of 30 minutes discussed and encouraged, as a part of hypertension management. The importance of attaining a healthy weight is also discussed.  BP/Weight 09/25/2018 09/25/2018 09/18/2018 08/21/2018 08/15/2018 06/25/2018 2/43/9892  Systolic BP 119 - 417 408 144 818 563  Diastolic BP 62 - 92 80 149 90 80  Wt. (Lbs) 165 168 157 162 - - 162  BMI 32.22 32.81 30.66 31.64 - - 31.64  Some encounter information is confidential and restricted. Go to Review Flowsheets activity to see all data.      '

## 2018-09-29 NOTE — Assessment & Plan Note (Signed)
Controlled, no change in medication DASH diet and commitment to daily physical activity for a minimum of 30 minutes discussed and encouraged, as a part of hypertension management. The importance of attaining a healthy weight is also discussed.  BP/Weight 09/25/2018 09/25/2018 09/18/2018 08/21/2018 08/15/2018 06/25/2018 10/09/6433  Systolic BP 391 - 225 834 621 947 125  Diastolic BP 62 - 92 80 271 90 80  Wt. (Lbs) 165 168 157 162 - - 162  BMI 32.22 32.81 30.66 31.64 - - 31.64  Some encounter information is confidential and restricted. Go to Review Flowsheets activity to see all data.

## 2018-10-16 ENCOUNTER — Ambulatory Visit (INDEPENDENT_AMBULATORY_CARE_PROVIDER_SITE_OTHER): Payer: PRIVATE HEALTH INSURANCE | Admitting: Orthopedic Surgery

## 2018-10-16 ENCOUNTER — Encounter: Payer: Self-pay | Admitting: Orthopedic Surgery

## 2018-10-16 ENCOUNTER — Other Ambulatory Visit: Payer: Self-pay

## 2018-10-16 VITALS — BP 131/103 | HR 91 | Temp 97.2°F | Ht 60.0 in | Wt 179.0 lb

## 2018-10-16 DIAGNOSIS — G588 Other specified mononeuropathies: Secondary | ICD-10-CM

## 2018-10-16 DIAGNOSIS — S92355D Nondisplaced fracture of fifth metatarsal bone, left foot, subsequent encounter for fracture with routine healing: Secondary | ICD-10-CM | POA: Diagnosis not present

## 2018-10-16 NOTE — Patient Instructions (Addendum)
OOW TO July 6   Morton Neuralgia  Morton neuralgia is foot pain that affects the ball of the foot and the area near the toes. Morton neuralgia occurs when part of a nerve in the foot (digital nerve) is under too much pressure (compressed). When this happens over a long period of time, the nerve can thicken (neuroma) and cause pain. Pain usually occurs between the third and fourth toes.  Morton neuralgia can come and go but may get worse over time. What are the causes? This condition is caused by doing the same things over and over with your foot, such as:  Activities such as running or jumping.  Wearing shoes that are too tight. What increases the risk? You may be at higher risk for Morton neuralgia if you:  Are female.  Wear high heels.  Wear shoes that are narrow or tight.  Do activities that repeatedly stretch your toes, such as: ? Running. ? Drew. ? Long-distance walking. What are the signs or symptoms? The first symptom of Morton neuralgia is pain that spreads from the ball of the foot to the toes. It may feel like you are walking on a marble. Pain usually gets worse with walking and goes away at night. Other symptoms may include numbness and cramping of your toes. Both feet are equally affected, but rarely at the same time. How is this diagnosed? This condition is diagnosed based on your symptoms, your medical history, and a physical exam. Your health care provider may:  Squeeze your foot just behind your toe.  Ask you to move your toes to check for pain.  Ask about your physical activity level. You also may have imaging tests, such as an X-ray, ultrasound, or MRI. How is this treated? Treatment depends on how severe your condition is and what causes it. Treatment may involve:  Wearing different shoes that are not too tight, are low-heeled, and provide good support. For some people, this is the only treatment needed.  Wearing an over-the-counter or custom supportive  pad (orthotic) under the front of your foot.  Getting injections of numbing medicine and anti-inflammatory medicine (steroid) in the nerve.  Having surgery to remove part of the thickened nerve. Follow these instructions at home: Managing pain, stiffness, and swelling   Massage your foot as needed.  Wear orthotics as told by your health care provider.  If directed, put ice on your foot: ? Put ice in a plastic bag. ? Place a towel between your skin and the bag. ? Leave the ice on for 20 minutes, 2-3 times a day.  Avoid activities that cause pain or make pain worse. If you play sports, ask your health care provider when it is safe for you to return to sports.  Raise (elevate) your foot above the level of your heart while lying down and, when possible, while sitting. General instructions  Take over-the-counter and prescription medicines only as told by your health care provider.  Do not drive or use heavy machinery while taking prescription pain medicine.  Wear shoes that: ? Have soft soles. ? Have a wide toe area. ? Provide arch support. ? Do not pinch or squeeze your feet. ? Have room for your orthotics, if applicable.  Keep all follow-up visits as told by your health care provider. This is important. Contact a health care provider if:  Your symptoms get worse or do not get better with treatment and home care. Summary  Morton neuralgia is foot pain that affects the  ball of the foot and the area near the toes. Pain usually occurs between the third and fourth toes, gets worse with walking, and goes away at night.  Morton neuralgia occurs when part of a nerve in the foot (digital nerve) is under too much pressure. When this happens over a long period of time, the nerve can thicken (neuroma) and cause pain.  This condition is caused by doing the same things over and over with your foot, such as running or jumping, wearing shoes that are too tight, or wearing high heels.   Treatment may involve wearing low-heeled shoes that are not too tight, wearing a supportive pad (orthotic) under the front of your foot, getting injections in the nerve, or having surgery to remove part of the thickened nerve. This information is not intended to replace advice given to you by your health care provider. Make sure you discuss any questions you have with your health care provider. Document Released: 07/24/2000 Document Revised: 05/01/2017 Document Reviewed: 05/01/2017 Elsevier Interactive Patient Education  2019 [employer].

## 2018-10-16 NOTE — Progress Notes (Signed)
Chief Complaint  Patient presents with  . Foot Injury    Lt foot fx DOI 08/15/18   This patient had a nondisplaced fifth metatarsal fracture back on April 16.  Our last x-ray show the fracture is in good position  She comes in today complaining of foot swelling and numbness between the fourth and fifth digits of the right foot.  She has been unable to get a shoe on secondary to swelling  Review of Systems  Musculoskeletal: Negative for joint pain.  Neurological: Positive for tingling. Negative for focal weakness.   BP (!) 131/103   Pulse 91   Temp (!) 97.2 F (36.2 C)   Ht 5' (1.524 m)   Wt 179 lb (81.2 kg)   BMI 34.96 kg/m  Physical Exam Vitals signs and nursing note reviewed.  Constitutional:      Appearance: Normal appearance.  Musculoskeletal:       Feet:  Skin:    General: Skin is warm and dry.  Neurological:     Mental Status: She is alert and oriented to person, place, and time.     Gait: Gait abnormal.  Psychiatric:        Mood and Affect: Mood normal.    Encounter Diagnoses  Name Primary?  . Nondisplaced fracture of fifth metatarsal bone, left foot, subsequent encounter for fracture with routine healing 08/15/18 Yes  . Neuroma digital nerve     Recommend compression stocking Recommend metatarsal pad Out of work 2 weeks Follow-up 2 weeks consider injection

## 2018-10-30 ENCOUNTER — Ambulatory Visit (INDEPENDENT_AMBULATORY_CARE_PROVIDER_SITE_OTHER): Payer: PRIVATE HEALTH INSURANCE | Admitting: Orthopedic Surgery

## 2018-10-30 ENCOUNTER — Encounter: Payer: Self-pay | Admitting: Orthopedic Surgery

## 2018-10-30 ENCOUNTER — Other Ambulatory Visit: Payer: Self-pay

## 2018-10-30 VITALS — BP 140/96 | HR 85 | Temp 98.6°F | Ht 60.0 in | Wt 177.0 lb

## 2018-10-30 DIAGNOSIS — G588 Other specified mononeuropathies: Secondary | ICD-10-CM

## 2018-10-30 DIAGNOSIS — S92355D Nondisplaced fracture of fifth metatarsal bone, left foot, subsequent encounter for fracture with routine healing: Secondary | ICD-10-CM | POA: Diagnosis not present

## 2018-10-30 NOTE — Progress Notes (Signed)
Progress Note   Patient ID: Rebecca Boyd, female   DOB: 15-Aug-1975, 43 y.o.   MRN: [id number]   Chief Complaint  Patient presents with   Fracture    Lt foot DOI 08/15/18    Encounter Diagnoses  Name Primary?   Nondisplaced fracture of fifth metatarsal bone, left foot, subsequent encounter for fracture with routine healing 08/15/18 Yes   Neuroma digital nerve     Allergies  Allergen Reactions   Diphenhydramine Hcl Anaphylaxis    (Benadryl)   Orange Fruit Hives, Shortness Of Breath, Itching and Other (See Comments)    MAKES IT DIFFICULT TO BREATH   Latex Rash     Current Outpatient Medications:    ALPRAZolam (XANAX) 1 MG tablet, Take one tablet once daily at bedtime for anxiety and sleep, Disp: 30 tablet, Rfl: 5   amLODipine (NORVASC) 2.5 MG tablet, TAKE 1 TABLET BY MOUTH ONCE DAILY, Disp: 90 tablet, Rfl: 2   Erenumab-aooe (AIMOVIG) 70 MG/ML SOAJ, Inject 70 mg into the skin every 30 (thirty) days., Disp: 1 pen, Rfl: 11   FLUoxetine (PROZAC) 20 MG capsule, Take 1 capsule (20 mg total) by mouth daily., Disp: 30 capsule, Rfl: 2   hydrOXYzine (ATARAX/VISTARIL) 25 MG tablet, Take 1 tablet (25 mg total) by mouth 3 (three) times daily as needed., Disp: 42 tablet, Rfl: 0   oxyCODONE-acetaminophen (PERCOCET/ROXICET) 5-325 MG tablet, Take one tablet once daily for back pain, by mouth, Disp: 30 tablet, Rfl: 0   QUEtiapine (SEROQUEL) 25 MG tablet, Take 1 tablet (25 mg total) by mouth at bedtime., Disp: 30 tablet, Rfl: 2   SUMAtriptan (IMITREX) 6 MG/0.5ML SOLN injection, Use one injection at onset of migraine.  May repeat in 2 hrs, if needed.  Max dose: 2 inj/day. This is a 30 day prescription., Disp: 10 vial, Rfl: 11   tiZANidine (ZANAFLEX) 4 MG tablet, Take 1 tablet (4 mg total) by mouth every 6 (six) hours as needed for muscle spasms., Disp: 30 tablet, Rfl: 5   topiramate (TOPAMAX) 100 MG tablet, Take 1 tablet (100 mg total) by mouth 2 (two) times daily., Disp: 180 tablet,  Rfl: 4   triamcinolone cream (KENALOG) 0.1 %, Apply to the skin neck down 1-2 times a day as needed for rash, Disp: , Rfl:    43 year old female had a fracture of the fifth metatarsal presented last visit with a neuroma  She was also having some swelling we put her in a compression stocking recommended a metatarsal pad continue to out of work status until July 7 and she comes in today for follow-up  She still having numbness and tingling in the fourth and fifth digit of the left foot and cannot wear a regular shoe, only can wear a croc    Review of Systems  Musculoskeletal: Positive for joint pain.  Neurological: Positive for tingling and sensory change.      BP (!) 140/96    Pulse 85    Temp 98.6 F (37 C)    Ht 5' (1.524 m)    Wt 177 lb (80.3 kg)    BMI 34.57 kg/m   Physical Exam Vitals signs and nursing note reviewed.  Constitutional:      Appearance: Normal appearance.  Musculoskeletal:       Feet:  Neurological:     Mental Status: She is alert and oriented to person, place, and time.  Psychiatric:        Mood and Affect: Mood normal.  Medical decisions:  (Established problem worse, x-ray ,physical therapy, over-the-counter medicines, read outside film or summarize x-ray)  Data  Imaging:   Prior x-ray showed a fracture which looks to have healed  Encounter Diagnoses  Name Primary?   Nondisplaced fracture of fifth metatarsal bone, left foot, subsequent encounter for fracture with routine healing 08/15/18 Yes   Neuroma digital nerve     PLAN:   Injection for Morton's neuroma Continue wide toe box shoe Out of work 2 weeks Follow-up 2 weeks   Procedure note Verbal consent Timeout to confirm.  Injection site Morton's neuroma injection fourth and fifth digit left foot Alcohol and ethyl chloride for skin preparation 25-gauge needle injected 40 mg Depo-Medrol with lidocaine mixture No complications    Arther Abbott, MD 10/30/2018 9:46 AM

## 2018-10-30 NOTE — Patient Instructions (Addendum)
Elevate foot wear the crocs   Make work note out until and thru July 15    Morton Neuralgia  Morton neuralgia is foot pain that affects the ball of the foot and the area near the toes. Morton neuralgia occurs when part of a nerve in the foot (digital nerve) is under too much pressure (compressed). When this happens over a long period of time, the nerve can thicken (neuroma) and cause pain. Pain usually occurs between the third and fourth toes.  Morton neuralgia can come and go but may get worse over time. What are the causes? This condition is caused by doing the same things over and over with your foot, such as:  Activities such as running or jumping.  Wearing shoes that are too tight. What increases the risk? You may be at higher risk for Morton neuralgia if you:  Are female.  Wear high heels.  Wear shoes that are narrow or tight.  Do activities that repeatedly stretch your toes, such as: ? Running. ? [place]. ? Long-distance walking. What are the signs or symptoms? The first symptom of Morton neuralgia is pain that spreads from the ball of the foot to the toes. It may feel like you are walking on a marble. Pain usually gets worse with walking and goes away at night. Other symptoms may include numbness and cramping of your toes. Both feet are equally affected, but rarely at the same time. How is this diagnosed? This condition is diagnosed based on your symptoms, your medical history, and a physical exam. Your health care provider may:  Squeeze your foot just behind your toe.  Ask you to move your toes to check for pain.  Ask about your physical activity level. You also may have imaging tests, such as an X-ray, ultrasound, or MRI. How is this treated? Treatment depends on how severe your condition is and what causes it. Treatment may involve:  Wearing different shoes that are not too tight, are low-heeled, and provide good support. For some people, this is the only treatment  needed.  Wearing an over-the-counter or custom supportive pad (orthotic) under the front of your foot.  Getting injections of numbing medicine and anti-inflammatory medicine (steroid) in the nerve.  Having surgery to remove part of the thickened nerve. Follow these instructions at home: Managing pain, stiffness, and swelling   Massage your foot as needed.  Wear orthotics as told by your health care provider.  If directed, put ice on your foot: ? Put ice in a plastic bag. ? Place a towel between your skin and the bag. ? Leave the ice on for 20 minutes, 2-3 times a day.  Avoid activities that cause pain or make pain worse. If you play sports, ask your health care provider when it is safe for you to return to sports.  Raise (elevate) your foot above the level of your heart while lying down and, when possible, while sitting. General instructions  Take over-the-counter and prescription medicines only as told by your health care provider.  Do not drive or use heavy machinery while taking prescription pain medicine.  Wear shoes that: ? Have soft soles. ? Have a wide toe area. ? Provide arch support. ? Do not pinch or squeeze your feet. ? Have room for your orthotics, if applicable.  Keep all follow-up visits as told by your health care provider. This is important. Contact a health care provider if:  Your symptoms get worse or do not get better with treatment  and home care. Summary  Morton neuralgia is foot pain that affects the ball of the foot and the area near the toes. Pain usually occurs between the third and fourth toes, gets worse with walking, and goes away at night.  Morton neuralgia occurs when part of a nerve in the foot (digital nerve) is under too much pressure. When this happens over a long period of time, the nerve can thicken (neuroma) and cause pain.  This condition is caused by doing the same things over and over with your foot, such as running or jumping,  wearing shoes that are too tight, or wearing high heels.  Treatment may involve wearing low-heeled shoes that are not too tight, wearing a supportive pad (orthotic) under the front of your foot, getting injections in the nerve, or having surgery to remove part of the thickened nerve. This information is not intended to replace advice given to you by your health care provider. Make sure you discuss any questions you have with your health care provider. Document Released: 07/24/2000 Document Revised: 05/01/2017 Document Reviewed: 05/01/2017 Elsevier Patient Education  2020 [employer].

## 2018-11-04 ENCOUNTER — Ambulatory Visit (INDEPENDENT_AMBULATORY_CARE_PROVIDER_SITE_OTHER): Payer: PRIVATE HEALTH INSURANCE | Admitting: Family Medicine

## 2018-11-04 ENCOUNTER — Other Ambulatory Visit: Payer: Self-pay

## 2018-11-04 ENCOUNTER — Encounter: Payer: Self-pay | Admitting: Family Medicine

## 2018-11-04 VITALS — BP 120/76 | HR 85 | Temp 98.9°F | Ht 60.0 in | Wt 177.1 lb

## 2018-11-04 DIAGNOSIS — I10 Essential (primary) hypertension: Secondary | ICD-10-CM | POA: Diagnosis not present

## 2018-11-04 DIAGNOSIS — G8929 Other chronic pain: Secondary | ICD-10-CM | POA: Diagnosis not present

## 2018-11-04 DIAGNOSIS — E669 Obesity, unspecified: Secondary | ICD-10-CM

## 2018-11-04 DIAGNOSIS — E559 Vitamin D deficiency, unspecified: Secondary | ICD-10-CM

## 2018-11-04 DIAGNOSIS — Z13220 Encounter for screening for lipoid disorders: Secondary | ICD-10-CM | POA: Diagnosis not present

## 2018-11-04 DIAGNOSIS — IMO0002 Reserved for concepts with insufficient information to code with codable children: Secondary | ICD-10-CM

## 2018-11-04 DIAGNOSIS — G43709 Chronic migraine without aura, not intractable, without status migrainosus: Secondary | ICD-10-CM

## 2018-11-04 MED ORDER — OXYCODONE-ACETAMINOPHEN 5-325 MG PO TABS: ORAL_TABLET | ORAL | 0 refills | Status: AC

## 2018-11-04 MED ORDER — ALPRAZOLAM 1 MG PO TABS: 1.0000 mg | ORAL_TABLET | Freq: Every day | ORAL | 5 refills | Status: DC

## 2018-11-04 MED ORDER — METRONIDAZOLE 0.75 % VA GEL: 1.0000 | Freq: Two times a day (BID) | VAGINAL | 0 refills | Status: DC

## 2018-11-04 NOTE — Assessment & Plan Note (Signed)
Improved, followed by Neurology, no change in management, headaches less frequent and less severe

## 2018-11-04 NOTE — Assessment & Plan Note (Signed)
  Patient re-educated about  the importance of commitment to a  minimum of 150 minutes of exercise per week as able.  The importance of healthy food choices with portion control discussed, as well as eating regularly and within a 12 hour window most days. The need to choose "clean , green" food 50 to 75% of the time is discussed, as well as to make water the primary drink and set a goal of 64 ounces water daily.    Weight /BMI 11/04/2018 10/30/2018 10/16/2018  WEIGHT 177 lb 1.9 oz 177 lb 179 lb  HEIGHT 5\' 0"  5\' 0"  5\' 0"   BMI 34.59 kg/m2 34.57 kg/m2 34.96 kg/m2  Some encounter information is confidential and restricted. Go to Review Flowsheets activity to see all data.

## 2018-11-04 NOTE — Patient Instructions (Addendum)
Annual physical exam with MD and pain management week of October, call if you need me before  Please see if you can get appt info re her Oncology referral from last visit, and provide pt with this / give her number to call  Labs today, fastiing lipid, chem 7  and eGFGR and vit D Med is sent as discussed to [place]   It is important that you exercise regularly at least 30 minutes 5 times a week. If you develop chest pain, have severe difficulty breathing, or feel very tired, stop exercising immediately and seek medical attention   Think about what you will eat, plan ahead. Choose " clean, green, fresh or frozen" over canned, processed or packaged foods which are more sugary, salty and fatty. 70 to 75% of food eaten should be vegetables and fruit. Three meals at set times with snacks allowed between meals, but they must be fruit or vegetables. Aim to eat over a 12 hour period , example 7 am to 7 pm, and STOP after  your last meal of the day. Drink water,generally about 64 ounces per day, no other drink is as healthy. Fruit juice is best enjoyed in a healthy way, by EATING the fruit.

## 2018-11-04 NOTE — Assessment & Plan Note (Signed)
The patient's Controlled Substance registry is reviewed and compliance confirmed. Adequacy of  Pain control and level of function is assessed. Medication dosing is adjusted as deemed appropriate. Twelve weeks of medication is prescribed , patient signs for the script and is provided with a follow up appointment between 11 to 12 weeks .  

## 2018-11-04 NOTE — Progress Notes (Signed)
Rebecca Boyd     MRN: [id number]      DOB: 10-Apr-1976   HPI Ms. Diamond is here for follow up and re-evaluation of chronic medical conditions, medication management and review of any available recent lab and radiology data.  Preventive health is updated, specifically  Cancer screening and Immunization.   Questions or concerns regarding consultations or procedures which the PT has had in the interim are  addressed. The PT denies any adverse reactions to current medications since the last visit.  States chronic pain management is appropriate C/o ongoing left foot pain, unable to wear a shoe  Or properly weight bear, out of work x 8 weeks and being followed by Ortho ROS Denies recent fever or chills. Denies sinus pressure, nasal congestion, ear pain or sore throat. Denies chest congestion, productive cough or wheezing. Denies chest pains, palpitations and leg swelling Denies abdominal pain, nausea, vomiting,diarrhea or constipation.   Denies dysuria, frequency, hesitancy or incontinence.  Denies headaches, seizures, numbness, or tingling. Denies uncontrolled  depression, anxiety or insomnia.Managed by psych and is improved Denies skin break down or rash.   PE  BP 120/76 (BP Location: Left Arm, Patient Position: Sitting, Cuff Size: Normal)    Pulse 85    Temp 98.9 F (37.2 C)    Ht 5' (1.524 m)    Wt 177 lb 1.9 oz (80.3 kg)    SpO2 100%    BMI 34.59 kg/m   Patient alert and oriented and in no cardiopulmonary distress.  HEENT: No facial asymmetry, EOMI,   oropharynx pink and moist.  Neck supple no JVD, no mass.  Chest: Clear to auscultation bilaterally.  CVS: S1, S2 no murmurs, no S3.Regular rate.  ABD: Soft non tender.   Ext: No edema  MS: Adequate ROM spine, shoulders, hips and knees.  Skin: Intact, no ulcerations or rash noted.  Psych: Good eye contact, normal affect. Memory intact not anxious or depressed appearing.  CNS: CN 2-12 intact, power,  normal throughout.no  focal deficits noted.   Assessment & Plan   Essential hypertension, benign Controlled, no change in medication DASH diet and commitment to daily physical activity for a minimum of 30 minutes discussed and encouraged, as a part of hypertension management. The importance of attaining a healthy weight is also discussed.  BP/Weight 11/04/2018 10/30/2018 10/16/2018 09/25/2018 09/25/2018 09/18/2018 8/76/8115  Systolic BP 726 203 559 741 - 638 453  Diastolic BP 76 96 646 62 - 92 80  Wt. (Lbs) 177.12 177 179 165 168 157 162  BMI 34.59 34.57 34.96 32.22 32.81 30.66 31.64  Some encounter information is confidential and restricted. Go to Review Flowsheets activity to see all data.       Chronic migraine Improved, followed by Neurology, no change in management, headaches less frequent and less severe  Obesity (BMI 30.0-34.9)  Patient re-educated about  the importance of commitment to a  minimum of 150 minutes of exercise per week as able.  The importance of healthy food choices with portion control discussed, as well as eating regularly and within a 12 hour window most days. The need to choose "clean , green" food 50 to 75% of the time is discussed, as well as to make water the primary drink and set a goal of 64 ounces water daily.    Weight /BMI 11/04/2018 10/30/2018 10/16/2018  WEIGHT 177 lb 1.9 oz 177 lb 179 lb  HEIGHT 5\' 0"  5\' 0"  5\' 0"   BMI 34.59 kg/m2 34.57 kg/m2 34.96  kg/m2  Some encounter information is confidential and restricted. Go to Review Flowsheets activity to see all data.      Encounter for chronic pain management The patient's Controlled Substance registry is reviewed and compliance confirmed. Adequacy of  Pain control and level of function is assessed. Medication dosing is adjusted as deemed appropriate. Twelve weeks of medication is prescribed , patient signs for the script and is provided with a follow up appointment between 11 to 12 weeks .

## 2018-11-04 NOTE — Assessment & Plan Note (Signed)
Controlled, no change in medication DASH diet and commitment to daily physical activity for a minimum of 30 minutes discussed and encouraged, as a part of hypertension management. The importance of attaining a healthy weight is also discussed.  BP/Weight 11/04/2018 10/30/2018 10/16/2018 09/25/2018 09/25/2018 09/18/2018 0/38/8828  Systolic BP 003 491 791 505 - 697 948  Diastolic BP 76 96 016 62 - 92 80  Wt. (Lbs) 177.12 177 179 165 168 157 162  BMI 34.59 34.57 34.96 32.22 32.81 30.66 31.64  Some encounter information is confidential and restricted. Go to Review Flowsheets activity to see all data.

## 2018-11-13 ENCOUNTER — Encounter: Payer: Self-pay | Admitting: Orthopedic Surgery

## 2018-11-13 ENCOUNTER — Other Ambulatory Visit: Payer: Self-pay

## 2018-11-13 ENCOUNTER — Ambulatory Visit (INDEPENDENT_AMBULATORY_CARE_PROVIDER_SITE_OTHER): Payer: PRIVATE HEALTH INSURANCE | Admitting: Orthopedic Surgery

## 2018-11-13 VITALS — BP 144/100 | HR 77 | Temp 97.3°F | Ht 60.0 in | Wt 176.0 lb

## 2018-11-13 DIAGNOSIS — G588 Other specified mononeuropathies: Secondary | ICD-10-CM | POA: Diagnosis not present

## 2018-11-13 NOTE — Patient Instructions (Signed)
Out of work until appointment with Dr Sharol Given

## 2018-11-13 NOTE — Progress Notes (Signed)
Progress Note   Patient ID: Rebecca Boyd, female   DOB: 1976-04-25, 43 y.o.   MRN: [id number]   Chief Complaint  Patient presents with  . Foot Pain    injection helped left foot but now pain has returned     Encounter Diagnosis  Name Primary?  . Neuroma digital nerve Yes    Allergies  Allergen Reactions  . Diphenhydramine Hcl Anaphylaxis    (Benadryl)  . Orange Fruit Hives, Shortness Of Breath, Itching and Other (See Comments)    MAKES IT DIFFICULT TO BREATH  . Latex Rash     Current Outpatient Medications:  .  [START ON 11/30/2018] ALPRAZolam (XANAX) 1 MG tablet, Take 1 tablet (1 mg total) by mouth at bedtime., Disp: 30 tablet, Rfl: 5 .  amLODipine (NORVASC) 2.5 MG tablet, TAKE 1 TABLET BY MOUTH ONCE DAILY, Disp: 90 tablet, Rfl: 2 .  Erenumab-aooe (AIMOVIG) 70 MG/ML SOAJ, Inject 70 mg into the skin every 30 (thirty) days., Disp: 1 pen, Rfl: 11 .  FLUoxetine (PROZAC) 20 MG capsule, Take 1 capsule (20 mg total) by mouth daily., Disp: 30 capsule, Rfl: 2 .  hydrOXYzine (ATARAX/VISTARIL) 25 MG tablet, Take 1 tablet (25 mg total) by mouth 3 (three) times daily as needed., Disp: 42 tablet, Rfl: 0 .  metroNIDAZOLE (METROGEL VAGINAL) 0.75 % vaginal gel, Place 1 Applicatorful vaginally 2 (two) times daily., Disp: 70 g, Rfl: 0 .  [START ON 11/25/2018] oxyCODONE-acetaminophen (PERCOCET) 5-325 MG tablet, Take one tablet by mouth once daily for back pain, Disp: 30 tablet, Rfl: 0 .  [START ON 12/26/2018] oxyCODONE-acetaminophen (PERCOCET) 5-325 MG tablet, Take one tablet by mouth once daily for back oain, Disp: 30 tablet, Rfl: 0 .  [START ON 01/26/2019] oxyCODONE-acetaminophen (PERCOCET) 5-325 MG tablet, Take one tablet by mouth once daily for back pain, Disp: 30 tablet, Rfl: 0 .  [START ON 12/25/2018] oxyCODONE-acetaminophen (PERCOCET/ROXICET) 5-325 MG tablet, Take one tablet by mouth once daily for back pain, Disp: 30 tablet, Rfl: 0 .  [START ON 01/25/2019] oxyCODONE-acetaminophen  (PERCOCET/ROXICET) 5-325 MG tablet, Take one tablet by mouth once daily for back pain, Disp: 30 tablet, Rfl: 0 .  QUEtiapine (SEROQUEL) 25 MG tablet, Take 1 tablet (25 mg total) by mouth at bedtime., Disp: 30 tablet, Rfl: 2 .  SUMAtriptan (IMITREX) 6 MG/0.5ML SOLN injection, Use one injection at onset of migraine.  May repeat in 2 hrs, if needed.  Max dose: 2 inj/day. This is a 30 day prescription., Disp: 10 vial, Rfl: 11 .  tiZANidine (ZANAFLEX) 4 MG tablet, Take 1 tablet (4 mg total) by mouth every 6 (six) hours as needed for muscle spasms., Disp: 30 tablet, Rfl: 5 .  topiramate (TOPAMAX) 100 MG tablet, Take 1 tablet (100 mg total) by mouth 2 (two) times daily., Disp: 180 tablet, Rfl: 4 .  triamcinolone cream (KENALOG) 0.1 %, Apply to the skin neck down 1-2 times a day as needed for rash, Disp: , Rfl:    43 year old female fractured her fifth metatarsal did well with the fracture however post injury developed what is apparently Morton's neuroma has had an injection in metatarsal pads as nonoperative treatment presents back for her follow-up.    She says that the injection in the pads helped for a little bit and then the pain came back worse than it was with more burning    Review of Systems  Musculoskeletal: Positive for back pain.    BP (!) 144/100   Pulse 77   Ht 5' (  1.524 m)   Wt 176 lb (79.8 kg)   BMI 34.37 kg/m   Physical Exam Vitals signs and nursing note reviewed.  Constitutional:      Appearance: Normal appearance.  Neurological:     Mental Status: She is alert and oriented to person, place, and time.  Psychiatric:        Mood and Affect: Mood normal.      Medical decisions:  (Established problem worse, x-ray ,physical therapy, over-the-counter medicines, read outside film or summarize x-ray)  Data  Imaging:   No new imaging today  Encounter Diagnosis  Name Primary?  . Neuroma digital nerve Yes    PLAN:   Kind of at wits end about what else to do going  to get a consult with the foot and ankle specialist  Out of work the next couple of weeks      Arther Abbott, MD 11/13/2018 2:34 PM

## 2018-11-14 ENCOUNTER — Ambulatory Visit: Payer: Self-pay | Admitting: Family Medicine

## 2018-11-19 ENCOUNTER — Other Ambulatory Visit: Payer: Self-pay

## 2018-11-19 DIAGNOSIS — Z20822 Contact with and (suspected) exposure to covid-19: Secondary | ICD-10-CM

## 2018-11-21 ENCOUNTER — Ambulatory Visit: Payer: PRIVATE HEALTH INSURANCE | Admitting: Orthopedic Surgery

## 2018-11-22 LAB — NOVEL CORONAVIRUS, NAA: SARS-CoV-2, NAA: NOT DETECTED

## 2018-12-02 ENCOUNTER — Ambulatory Visit (INDEPENDENT_AMBULATORY_CARE_PROVIDER_SITE_OTHER): Payer: Self-pay | Admitting: Orthopedic Surgery

## 2018-12-02 ENCOUNTER — Other Ambulatory Visit: Payer: Self-pay

## 2018-12-02 ENCOUNTER — Encounter: Payer: Self-pay | Admitting: Orthopedic Surgery

## 2018-12-02 VITALS — Ht 60.0 in | Wt 176.0 lb

## 2018-12-02 DIAGNOSIS — M79672 Pain in left foot: Secondary | ICD-10-CM

## 2018-12-02 DIAGNOSIS — M6702 Short Achilles tendon (acquired), left ankle: Secondary | ICD-10-CM | POA: Diagnosis not present

## 2018-12-03 ENCOUNTER — Encounter: Payer: Self-pay | Admitting: Orthopedic Surgery

## 2018-12-03 NOTE — Progress Notes (Addendum)
Office Visit Note   Patient: Rebecca Boyd           Date of Birth: [birth year]           MRN: [id number] Visit Date: 12/02/2018              Requested by: Carole Civil, MD [address] [place],   [address] PCP: Fayrene Helper, MD  Chief Complaint  Patient presents with  . Left Foot - Pain      HPI: Patient is a 43 year old woman who presents complaining of forefoot pain left worse than right.  Patient is status post a 5th metatarsal fracture back in April which has healed.  She states she has foot swelling with regular shoewear.  She states she had an injection in June in the third webspace for Morton's neuroma, she complains of tingling and swelling across the forefoot with a burning sensation.  Assessment & Plan: Visit Diagnoses:  1. Achilles tendon contracture, bilateral   2. Pain in right foot     Plan: Recommended Achilles stretching a stiff soled sneaker reevaluate in 4 weeks.  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  She states she does not feel like she can return to work at this time and she requested a note to be out of work for 4 weeks.  This was provided.   Follow-Up Instructions: Return in about 4 weeks (around 12/30/2018).   Ortho Exam  Patient is alert, oriented, no adenopathy, well-dressed, normal affect, normal respiratory effort. Examination patient has an antalgic gait.  She has early toe off.  On the right foot she has dorsiflexion about 20 degrees past neutral left foot dorsiflexion is 10 degrees short of neutral.  Patient has pain to palpation across the metatarsal heads 3 4 and 5 the webspaces are nonspecifically tender to palpation and lateral compression of the metatarsal heads does not reproduce pain and does not produce a clunk.  Subjectively patient has numbness and tingling across the entire forefoot  Radiographs were reviewed which shows a healed shaft fracture distally of the left fifth metatarsal.   Imaging: No results found. No images are attached to the encounter.  Labs: Lab Results  Component Value Date   HGBA1C 5.2 06/10/2018   HGBA1C 5.4 05/29/2013     Lab Results  Component Value Date   ALBUMIN 4.1 05/10/2016   ALBUMIN 3.5 11/19/2013   ALBUMIN 3.7 06/30/2013    No results found for: MG Lab Results  Component Value Date   VD25OH 10 (L) 05/10/2016   VD25OH 9 (L) 06/04/2014   VD25OH 11 (L) 05/29/2013    No results found for: PREALBUMIN CBC EXTENDED Latest Ref Rng & Units 06/25/2018 02/04/2018 08/13/2016  WBC 4.0 - 10.5 K/uL 7.8 5.4 5.9  RBC 3.87 - 5.11 MIL/uL 4.62 4.57 4.23  HGB 12.0 - 15.0 g/dL 13.9 13.9 13.0  HCT 36.0 - 46.0 % 40.8 40.7 36.7  PLT 150 - 400 K/uL 188 162 149(L)  NEUTROABS 1.7 - 7.7 K/uL 5.7 3.8 -  LYMPHSABS 0.7 - 4.0 K/uL 1.4 1.3 -     Body mass index is 34.37 kg/m.  Orders:  No orders of the defined types were placed in this encounter.  No orders of the defined types were placed in this encounter.    Procedures: No procedures performed  Clinical Data: No additional findings.  ROS:  All other systems negative, except as noted in the HPI. Review of Systems  Objective: Vital Signs: Ht 5' (1.524 m)   Wt 176 lb (79.8 kg)   BMI 34.37 kg/m   Specialty Comments:  No specialty comments available.  PMFS History: Patient Active Problem List   Diagnosis Date Noted  . Pruritus 09/29/2018  . Nondisplaced fracture of fifth metatarsal bone, left foot, subsequent encounter for fracture with routine healing 08/15/18 09/25/2018  . Family history of breast cancer in sister 09/25/2018  . Pruritus ani 09/25/2018  . FH: breast cancer in relative when <28 years old 06/10/2018  . Major depression with psychotic features ([place]) 12/02/2017  . Rash and nonspecific skin eruption 05/14/2017  . Chronic migraine 12/06/2016  . Encounter for chronic pain management 05/10/2016  . Headache 03/16/2016  . Obesity (BMI 30.0-34.9) 03/17/2015  .  Essential hypertension, benign 01/07/2014  . Cervical neck pain with evidence of disc disease 10/02/2013  . Low back pain with radiation 07/30/2013  . 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 Vitamin D deficiency 10/27/2010  . Anxiety 10/26/2010   Past Medical History:  Diagnosis Date  . Anemia   . Anxiety   . Depressive disorder, not elsewhere classified   . Hypertension   . Migraine   . Migraines   . Miscarriage 2009  . Neck pain   . Vaginitis    cyctitis    Family History  Problem Relation Age of Onset  . Drug abuse Mother   . Hypertension Father   . Breast cancer Sister   . Hypertension Maternal Grandmother   . Hypertension Maternal Grandfather   . Hypertension Paternal Grandmother     Past Surgical History:  Procedure Laterality Date  . cervical cryotherapy N/A 1999  . DILITATION & CURRETTAGE/HYSTROSCOPY WITH THERMACHOICE ABLATION  08/26/2013   Procedure: DILATATION & CURETTAGE/HYSTEROSCOPY WITH THERMACHOICE ENDOMETRIAL ABLATION Procedure #2 Total Therapy Time=min       sec;  Surgeon: Jonnie Kind, MD;  Location: [facility];  Service: Gynecology;;  . LAPAROSCOPIC BILATERAL SALPINGECTOMY Bilateral 08/26/2013   Procedure: LAPAROSCOPIC BILATERAL SALPINGECTOMY AND REMOVAL OF LEFT PERITUBAL CYST Procedure #1;  Surgeon: Jonnie Kind, MD;  Location: [facility];  Service: Gynecology;  Laterality: Bilateral;  . LAPAROSCOPIC LYSIS OF ADHESIONS  08/26/2013   Procedure: LAPAROSCOPIC LYSIS OF ADHESIONS Procedure #1;  Surgeon: Jonnie Kind, MD;  Location: [facility];  Service: Gynecology;;  . Carmelia Bake LESION REMOVAL  08/26/2013   Procedure: REMOVAL OF VULVAR SEBACEOUS CYST Procedure #3;  Surgeon: Jonnie Kind, MD;  Location: [facility];  Service: Gynecology;;   Social History   Occupational History  . Not on file  Tobacco Use  . Smoking status: Former Smoker    Packs/day: 0.00    Types: Cigars    Quit date: 01/05/2017    Years since quitting: 1.9  . Smokeless tobacco: Never Used  Substance and  Sexual Activity  . Alcohol use: Yes    Comment: occasional  . Drug use: No  . Sexual activity: Yes    Birth control/protection: None

## 2018-12-04 ENCOUNTER — Telehealth: Payer: Self-pay | Admitting: Neurology

## 2018-12-04 NOTE — Telephone Encounter (Signed)
Called the patient back and advised her that Dr Krista Blue was ok for the pt coming in for infusion. I have offered her the openings available by the IV suite. She will accept 8/6/ at 12:00 pm.

## 2018-12-04 NOTE — Telephone Encounter (Signed)
Pt has had a migraine for 3-4 days, she is asking for an infusion please call

## 2018-12-04 NOTE — Telephone Encounter (Signed)
Chart reviewed, patient was treated for migraine, last infusion was in February 2020,  She may come in for repeat infusion,  Depacon 500 mg may repeat once, Compazine 10 mg Toradol 30 mg

## 2018-12-05 DIAGNOSIS — G43709 Chronic migraine without aura, not intractable, without status migrainosus: Secondary | ICD-10-CM | POA: Diagnosis not present

## 2018-12-18 ENCOUNTER — Telehealth: Payer: Self-pay | Admitting: *Deleted

## 2018-12-18 ENCOUNTER — Other Ambulatory Visit: Payer: Self-pay

## 2018-12-18 MED ORDER — AMLODIPINE BESYLATE 2.5 MG PO TABS: 2.5000 mg | ORAL_TABLET | Freq: Every day | ORAL | 0 refills | Status: DC

## 2018-12-18 NOTE — Telephone Encounter (Signed)
Medication refilled and sent to pharmacy.

## 2018-12-18 NOTE — Telephone Encounter (Signed)
Pt LVM saying she was out of her bp medication and needs a refill sent in

## 2018-12-30 ENCOUNTER — Encounter: Payer: Self-pay | Admitting: Orthopedic Surgery

## 2018-12-30 ENCOUNTER — Ambulatory Visit (INDEPENDENT_AMBULATORY_CARE_PROVIDER_SITE_OTHER): Payer: Self-pay | Admitting: Orthopedic Surgery

## 2018-12-30 VITALS — Ht 60.0 in | Wt 176.0 lb

## 2018-12-30 DIAGNOSIS — M6702 Short Achilles tendon (acquired), left ankle: Secondary | ICD-10-CM

## 2018-12-30 NOTE — Progress Notes (Signed)
Office Visit Note   Patient: Rebecca Boyd           Date of Birth: April 03, 1976           MRN: [id number] Visit Date: 12/30/2018              Requested by: Fayrene Helper, MD [address] [place],  [place] [address] PCP: Fayrene Helper, MD  Chief Complaint  Patient presents with  . Left Leg - Follow-up      HPI: Patient is a 43 year old woman who presents in follow-up for burning tingling pain across the forefoot secondary to increased pressure from Achilles contracture she has been doing Achilles stretching but she states she is still symptomatic.  Assessment & Plan: Visit Diagnoses:  1. Achilles tendon contracture, left     Plan: Recommended continue Achilles stretching this was demonstrated to her.  Discussed that if she fails conservative treatment we would need to consider gastrocnemius recession.  Patient states that being on her feet for a 12-hour shift is painful she does not feel like she can return to work at this time and she is given a note to be out of work for 4 weeks.  Follow-Up Instructions: Return in about 4 weeks (around 01/27/2019).   Ortho Exam  Patient is alert, oriented, no adenopathy, well-dressed, normal affect, normal respiratory effort. Examination patient has a good pulse there is no redness no cellulitis she is globally tender to palpation beneath the metatarsal heads the webspaces are not tender no evidence of neuroma.  She has dorsiflexion to neutral with her knee extended.  Imaging: No results found. No images are attached to the encounter.  Labs: Lab Results  Component Value Date   HGBA1C 5.2 06/10/2018   HGBA1C 5.4 05/29/2013     Lab Results  Component Value Date   ALBUMIN 4.1 05/10/2016   ALBUMIN 3.5 11/19/2013   ALBUMIN 3.7 06/30/2013    No results found for: MG Lab Results  Component Value Date   VD25OH 10 (L) 05/10/2016   VD25OH 9 (L) 06/04/2014   VD25OH 11 (L) 05/29/2013    No results found for:  PREALBUMIN CBC EXTENDED Latest Ref Rng & Units 06/25/2018 02/04/2018 08/13/2016  WBC 4.0 - 10.5 K/uL 7.8 5.4 5.9  RBC 3.87 - 5.11 MIL/uL 4.62 4.57 4.23  HGB 12.0 - 15.0 g/dL 13.9 13.9 13.0  HCT 36.0 - 46.0 % 40.8 40.7 36.7  PLT 150 - 400 K/uL 188 162 149(L)  NEUTROABS 1.7 - 7.7 K/uL 5.7 3.8 -  LYMPHSABS 0.7 - 4.0 K/uL 1.4 1.3 -     Body mass index is 34.37 kg/m.  Orders:  No orders of the defined types were placed in this encounter.  No orders of the defined types were placed in this encounter.    Procedures: No procedures performed  Clinical Data: No additional findings.  ROS:  All other systems negative, except as noted in the HPI. Review of Systems  Objective: Vital Signs: Ht 5' (1.524 m)   Wt 176 lb (79.8 kg)   BMI 34.37 kg/m   Specialty Comments:  No specialty comments available.  PMFS History: Patient Active Problem List   Diagnosis Date Noted  . Pruritus 09/29/2018  . Nondisplaced fracture of fifth metatarsal bone, left foot, subsequent encounter for fracture with routine healing 08/15/18 09/25/2018  . Family history of breast cancer in sister 09/25/2018  . Pruritus ani 09/25/2018  . FH: breast cancer in relative when <45  years old 06/10/2018  . Major depression with psychotic features ([place]) 12/02/2017  . Rash and nonspecific skin eruption 05/14/2017  . Chronic migraine 12/06/2016  . Encounter for chronic pain management 05/10/2016  . Headache 03/16/2016  . Obesity (BMI 30.0-34.9) 03/17/2015  . Essential hypertension, benign 01/07/2014  . Cervical neck pain with evidence of disc disease 10/02/2013  . Low back pain with radiation 07/30/2013  . 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 Vitamin D deficiency 10/27/2010  . Anxiety 10/26/2010   Past Medical History:  Diagnosis Date  . Anemia   . Anxiety   . Depressive disorder, not elsewhere classified   . Hypertension   . Migraine   . Migraines   . Miscarriage 2009  . Neck pain   . Vaginitis    cyctitis     Family History  Problem Relation Age of Onset  . Drug abuse Mother   . Hypertension Father   . Breast cancer Sister   . Hypertension Maternal Grandmother   . Hypertension Maternal Grandfather   . Hypertension Paternal Grandmother     Past Surgical History:  Procedure Laterality Date  . cervical cryotherapy N/A 1999  . DILITATION & CURRETTAGE/HYSTROSCOPY WITH THERMACHOICE ABLATION  08/26/2013   Procedure: DILATATION & CURETTAGE/HYSTEROSCOPY WITH THERMACHOICE ENDOMETRIAL ABLATION Procedure #2 Total Therapy Time=min       sec;  Surgeon: Jonnie Kind, MD;  Location: [facility];  Service: Gynecology;;  . LAPAROSCOPIC BILATERAL SALPINGECTOMY Bilateral 08/26/2013   Procedure: LAPAROSCOPIC BILATERAL SALPINGECTOMY AND REMOVAL OF LEFT PERITUBAL CYST Procedure #1;  Surgeon: Jonnie Kind, MD;  Location: [facility];  Service: Gynecology;  Laterality: Bilateral;  . LAPAROSCOPIC LYSIS OF ADHESIONS  08/26/2013   Procedure: LAPAROSCOPIC LYSIS OF ADHESIONS Procedure #1;  Surgeon: Jonnie Kind, MD;  Location: [facility];  Service: Gynecology;;  . Carmelia Bake LESION REMOVAL  08/26/2013   Procedure: REMOVAL OF VULVAR SEBACEOUS CYST Procedure #3;  Surgeon: Jonnie Kind, MD;  Location: [facility];  Service: Gynecology;;   Social History   Occupational History  . Not on file  Tobacco Use  . Smoking status: Former Smoker    Packs/day: 0.00    Types: Cigars    Quit date: 01/05/2017    Years since quitting: 1.9  . Smokeless tobacco: Never Used  Substance and Sexual Activity  . Alcohol use: Yes    Comment: occasional  . Drug use: No  . Sexual activity: Yes    Birth control/protection: None

## 2018-12-31 ENCOUNTER — Other Ambulatory Visit: Payer: Self-pay

## 2018-12-31 ENCOUNTER — Telehealth: Payer: Self-pay | Admitting: Family Medicine

## 2018-12-31 MED ORDER — ALBUTEROL SULFATE HFA 108 (90 BASE) MCG/ACT IN AERS: 2.0000 | INHALATION_SPRAY | Freq: Four times a day (QID) | RESPIRATORY_TRACT | 0 refills | Status: DC | PRN

## 2018-12-31 NOTE — Telephone Encounter (Signed)
Please call PT inhalor into [facility] or [facility]

## 2018-12-31 NOTE — Telephone Encounter (Signed)
Inhaler sent in to [facility]

## 2019-01-20 ENCOUNTER — Other Ambulatory Visit: Payer: Self-pay | Admitting: Family Medicine

## 2019-01-20 ENCOUNTER — Telehealth: Payer: Self-pay | Admitting: Family Medicine

## 2019-01-20 ENCOUNTER — Telehealth: Payer: Self-pay | Admitting: *Deleted

## 2019-01-20 MED ORDER — OXYCODONE-ACETAMINOPHEN 5-325 MG PO TABS: ORAL_TABLET | ORAL | 0 refills | Status: DC

## 2019-01-20 NOTE — Telephone Encounter (Signed)
FYI

## 2019-01-20 NOTE — Telephone Encounter (Signed)
Pt called said her oxycodone was with [facility] however [facility] said they are no longer carrying this medication. She would like the new prescription sent to [facility] in [place]. Its due for refill on 9-25

## 2019-01-20 NOTE — Telephone Encounter (Signed)
pls let her know med sent to [facility] to be filled on 01/24/2019

## 2019-01-21 NOTE — Telephone Encounter (Signed)
Called patient to let her know about the medication. No answer and no vm. Will try again later.

## 2019-01-22 NOTE — Telephone Encounter (Signed)
Called patient to let her know about her prescription. No answer, no vm. Will try again later

## 2019-01-27 ENCOUNTER — Ambulatory Visit: Payer: PRIVATE HEALTH INSURANCE | Admitting: Orthopedic Surgery

## 2019-01-30 ENCOUNTER — Ambulatory Visit: Payer: BLUE CROSS/BLUE SHIELD | Admitting: Neurology

## 2019-02-04 ENCOUNTER — Ambulatory Visit: Payer: PRIVATE HEALTH INSURANCE | Admitting: Orthopedic Surgery

## 2019-02-05 ENCOUNTER — Other Ambulatory Visit: Payer: Self-pay

## 2019-02-05 ENCOUNTER — Encounter: Payer: Self-pay | Admitting: Family Medicine

## 2019-02-05 ENCOUNTER — Ambulatory Visit (INDEPENDENT_AMBULATORY_CARE_PROVIDER_SITE_OTHER): Payer: Self-pay | Admitting: Family Medicine

## 2019-02-05 VITALS — BP 124/78 | HR 84 | Temp 97.9°F | Resp 15 | Ht 60.0 in | Wt 176.0 lb

## 2019-02-05 DIAGNOSIS — G8929 Other chronic pain: Secondary | ICD-10-CM

## 2019-02-05 DIAGNOSIS — F419 Anxiety disorder, unspecified: Secondary | ICD-10-CM

## 2019-02-05 DIAGNOSIS — Z0000 Encounter for general adult medical examination without abnormal findings: Secondary | ICD-10-CM

## 2019-02-05 DIAGNOSIS — E559 Vitamin D deficiency, unspecified: Secondary | ICD-10-CM

## 2019-02-05 DIAGNOSIS — N644 Mastodynia: Secondary | ICD-10-CM

## 2019-02-05 DIAGNOSIS — G43709 Chronic migraine without aura, not intractable, without status migrainosus: Secondary | ICD-10-CM

## 2019-02-05 DIAGNOSIS — IMO0002 Reserved for concepts with insufficient information to code with codable children: Secondary | ICD-10-CM

## 2019-02-05 DIAGNOSIS — I10 Essential (primary) hypertension: Secondary | ICD-10-CM

## 2019-02-05 MED ORDER — KETOROLAC TROMETHAMINE 60 MG/2ML IM SOLN
60.0000 mg | Freq: Once | INTRAMUSCULAR | Status: AC
  Administered 2019-02-05: 60 mg via INTRAMUSCULAR

## 2019-02-05 MED ORDER — METHYLPREDNISOLONE ACETATE 80 MG/ML IJ SUSP
80.0000 mg | Freq: Once | INTRAMUSCULAR | Status: AC
  Administered 2019-02-05: 80 mg via INTRAMUSCULAR

## 2019-02-05 MED ORDER — HYDROXYZINE HCL 25 MG PO TABS: ORAL_TABLET | ORAL | 5 refills | Status: DC

## 2019-02-05 NOTE — Assessment & Plan Note (Signed)
Intermittent x 2 to 3 weeks, tender nodule at 9 oclock strong f/h of breast cancer, diagnostic left mammogram

## 2019-02-05 NOTE — Assessment & Plan Note (Signed)

## 2019-02-05 NOTE — Assessment & Plan Note (Signed)
Current flare, toradol 60 mg and depo medrol 80 mg iM in office

## 2019-02-05 NOTE — Progress Notes (Signed)
      Rebecca PHILIPPI     MRN: [id number]      DOB: 1975/10/07  HPI: Patient is in for annual physical exam. 5 day h/o increased headache , migraine in nature, denies new vision disturbance , weakness or numbness 2 weeek h/o left breast pain and tenderness Recent labs, if available are reviewed. Immunization is reviewed , and  updated if needed.   PE: BP 124/78   Pulse 84   Temp 97.9 F (36.6 C) (Temporal)   Resp 15   Ht 5' (1.524 m)   Wt 176 lb (79.8 kg)   LMP 01/27/2019 (Approximate)   SpO2 98%   BMI 34.37 kg/m   Pleasant  female, alert and oriented x 3, in no cardio-pulmonary distress. Afebrile. HEENT No facial trauma or asymetry. Sinuses non tender.  Extra occullar muscles intact.. External ears normal, . Neck: supple, no adenopathy,JVD or thyromegaly.No bruits.  Chest: Clear to ascultation bilaterally.No crackles or wheezes. Non tender to palpation  Breast: Left breast tender to palpation at 9 o clock with palpablr nodule. No nipple inversion or d/c , no supraclavicular or axillary nodes Right breast: no mass, tenderness, nipple inversion or lymphadenopathy  Cardiovascular system; Heart sounds normal,  S1 and  S2 ,no S3.  No murmur, or thrill. Apical beat not displaced Peripheral pulses normal.  Abdomen: Soft, non tender, no organomegaly or masses. No bruits. Bowel sounds normal. No guarding, tenderness or rebound.   GU: Deferred, asymptomatic and pap not due  Musculoskeletal exam: Decreased though adequate  ROM of spine, normal in  hips , shoulders and knees. No deformity ,swelling or crepitus noted. No muscle wasting or atrophy.   Neurologic: Cranial nerves 2 to 12 intact. Power, tone ,sensation and reflexes normal throughout. No disturbance in gait. No tremor.  Skin: Intact, no ulceration, erythema , scaling or rash noted. Pigmentation normal throughout  Psych; Depressed mood, reports auditory hallucinations, not threatening, denies  visual hallucinations. Judgement and concentration normal   Assessment & Plan:  Annual physical exam Annual exam as documented. Counseling done  re healthy lifestyle involving commitment to 150 minutes exercise per week, heart healthy diet, and attaining healthy weight.The importance of adequate sleep also discussed. Regular seat belt use and home safety, is also discussed. Changes in health habits are decided on by the patient with goals and time frames  set for achieving them. Immunization and cancer screening needs are specifically addressed at this visit.   Chronic migraine Current flare, toradol 60 mg and depo medrol 80 mg iM in office  Encounter for chronic pain management The patient's Controlled Substance registry is reviewed and compliance confirmed. Adequacy of  Pain control and level of function is assessed. Medication dosing is adjusted as deemed appropriate. Twelve weeks of medication is prescribed , patient signs for the script and is provided with a follow up appointment between 11 to 12 weeks .   Breast pain, left Intermittent x 2 to 3 weeks, tender nodule at 9 oclock strong f/h of breast cancer, diagnostic left mammogram

## 2019-02-05 NOTE — Assessment & Plan Note (Signed)
The patient's Controlled Substance registry is reviewed and compliance confirmed. Adequacy of  Pain control and level of function is assessed. Medication dosing is adjusted as deemed appropriate. Twelve weeks of medication is prescribed , patient signs for the script and is provided with a follow up appointment between 11 to 12 weeks .  

## 2019-02-05 NOTE — Patient Instructions (Addendum)
F/U 3rd week in January, call if you need me before  Declined flu vaccine   Toradol 60 mg iM and depo Medrol 80 mg IM for headache  You will be referred for diagnostic left mammogram, we will call with appointment  Please get labs ordered in July first or 2nd week in January ( please reprint)  I have reached out to Dr Harrington Challenger as you are aware  Thanks for choosing [facility], we consider it a privelige to serve you.

## 2019-02-06 ENCOUNTER — Telehealth: Payer: Self-pay

## 2019-02-06 DIAGNOSIS — N63 Unspecified lump in unspecified breast: Secondary | ICD-10-CM

## 2019-02-06 NOTE — Telephone Encounter (Signed)
mammo order entered.

## 2019-02-07 ENCOUNTER — Encounter: Payer: Self-pay | Admitting: Family Medicine

## 2019-02-07 MED ORDER — OXYCODONE-ACETAMINOPHEN 5-325 MG PO TABS: ORAL_TABLET | ORAL | 0 refills | Status: AC

## 2019-02-07 MED ORDER — OXYCODONE-ACETAMINOPHEN 5-325 MG PO TABS: ORAL_TABLET | ORAL | 0 refills | Status: DC

## 2019-02-10 NOTE — Progress Notes (Signed)
PATIENT: Rebecca Boyd DOB: 11-01-1975  REASON FOR VISIT: follow up HISTORY FROM: patient  HISTORY OF PRESENT ILLNESS: Today 02/11/19  HISTORY  HISTORY OF PRESENT ILLNESS: Rebecca Boyd 43 years old right-handed female, seen in refer by her primary care doctor Rebecca Boyd, for evaluation of headache, initial evaluation was December 06 2016.  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  She reported a history of migraine headaches since 2007, used to be intermittent, couple times each months, but since June 2018, she reported increased headache, to almost daily bases, constant low-grade lateralized headache, frequent exacerbation to severe pounding headache with associated light noise sensitivity, nauseous, sometimes associated seeing flashlight prior to and during her severe migraine headache, her headache can last for couple days,  She was started on Topamax 100 mg twice a day about 2 months ago, with mild side effect, but no significant help, she also takes Imitrex 50 mg as needed, is helpful most of the time,  Trigger for her migraine headaches are weather change, bright light, strong smells,  UPDATE Sept 6 2018: She has tried imitrex 50mg  as needed few times over past one month, she throw up each time. Today, she complains of 2 weeks of severe left parietal area sharp headaches,she has tried imitrex, phenergan did not help.   UPDATE Feb 4th 2019:  Her migraine has much improved, now she has it about twice a month, lasting for 2 days, she throw up maxalt dissolvablesometimes becauseofsevere nausea duringmigraine headaches,  UPDATE 8/5/2019CM Rebecca Boyd, 43 year old female returns for follow-up with history of migraine headaches.  She is doing much better with her migraines has maybe 2/ month.  She is currently on nortriptyline 20 mg at bedtime Topamax 100  twice a day, Imitrex and Maxalt acutely as needed.  She is not aware of any specific foods that cause problems.  She returns for reevaluation  Virtual visit July 31 2018: She still has frequent migraine headaches, in one month, she can have headache days up to 10 days in a month, she often has to use imitrex injection twice in 1 day for her migraine. Sometimes, she comes to office for infusions.  Update 02/11/2019 SS: She was in the office 12/05/2018 for infusion Depacon, Compazine, Toradol. At her PCP 02/05/2019 given toradol and depo medrol IM injection. She is prescribed oxycodone for chronic back pain from PCP. She remain on Aimovig 70 mg monthly injection, Topamax 100 mg BID, and Zanaflex, Imitrex injection as needed. Before the Aimovig injection, she was having 4 bad migraines, now having 2 migraines a month. She is out of Zanaflex. The imitrex injection will improve the headache, not always relieve it. Otherwise health is doing good. She is out of work currently due to the coronavirus pandemic. She is a Glass blower/designer.  Her headaches have improved 50% with Aimovig.  She is tolerating medications well without side effect.  REVIEW OF SYSTEMS: Out of a complete 14 system review of symptoms, the patient complains only of the following symptoms, and all other reviewed systems are negative.  Dizziness, headache, weakness  ALLERGIES: Allergies  Allergen Reactions  . Diphenhydramine Hcl Anaphylaxis    (Benadryl)  . Orange Fruit Hives, Shortness Of Breath, Itching and Other (See Comments)    MAKES IT DIFFICULT TO BREATH  . Latex Rash    HOME MEDICATIONS: Outpatient Medications Prior to Visit  Medication Sig  Dispense Refill  . albuterol (VENTOLIN HFA) 108 (90 Base) MCG/ACT inhaler Inhale 2 puffs into the lungs every 6 (six) hours as needed for wheezing or shortness of breath. 18 g 0  . ALPRAZolam (XANAX) 1 MG tablet Take 1 tablet (1 mg total) by mouth at bedtime. 30 tablet 5  . amLODipine  (NORVASC) 2.5 MG tablet Take 1 tablet (2.5 mg total) by mouth daily. 90 tablet 0  . Erenumab-aooe (AIMOVIG) 70 MG/ML SOAJ Inject 70 mg into the skin every 30 (thirty) days. 1 pen 11  . FLUoxetine (PROZAC) 20 MG capsule Take 1 capsule (20 mg total) by mouth daily. 30 capsule 2  . hydrOXYzine (ATARAX/VISTARIL) 25 MG tablet Take one tablet once daily ,by mouth , as needed , for itching 30 tablet 5  . oxyCODONE-acetaminophen (PERCOCET) 5-325 MG tablet Take one tablet by mouth once daily for back pain 30 tablet 0  . [START ON 02/23/2019] oxyCODONE-acetaminophen (PERCOCET) 5-325 MG tablet Take one tablet by mouth one time daily for back pain 30 tablet 0  . oxyCODONE-acetaminophen (PERCOCET/ROXICET) 5-325 MG tablet Take one tablet by mouth once daily for back pain 30 tablet 0  . oxyCODONE-acetaminophen (PERCOCET/ROXICET) 5-325 MG tablet Take one tablet by mouth once daily for bak pain 30 tablet 0  . [START ON 03/25/2019] oxyCODONE-acetaminophen (PERCOCET/ROXICET) 5-325 MG tablet Take one tablet by mouth once daily for back pain 30 tablet 0  . [START ON 04/24/2019] oxyCODONE-acetaminophen (PERCOCET/ROXICET) 5-325 MG tablet Take one tablet by mouth once daily for back pain 30 tablet 0  . QUEtiapine (SEROQUEL) 25 MG tablet Take 1 tablet (25 mg total) by mouth at bedtime. 30 tablet 2  . SUMAtriptan (IMITREX) 6 MG/0.5ML SOLN injection Use one injection at onset of migraine.  May repeat in 2 hrs, if needed.  Max dose: 2 inj/day. This is a 30 day prescription. 10 vial 11  . topiramate (TOPAMAX) 100 MG tablet Take 1 tablet (100 mg total) by mouth 2 (two) times daily. 180 tablet 4  . triamcinolone cream (KENALOG) 0.1 % Apply to the skin neck down 1-2 times a day as needed for rash    . tiZANidine (ZANAFLEX) 4 MG tablet Take 1 tablet (4 mg total) by mouth every 6 (six) hours as needed for muscle spasms. 30 tablet 5   No facility-administered medications prior to visit.     PAST MEDICAL HISTORY: Past Medical  History:  Diagnosis Date  . Anemia   . Anxiety   . Depressive disorder, not elsewhere classified   . Hypertension   . Migraine   . Migraines   . Miscarriage 2009  . Neck pain   . Vaginitis    cyctitis    PAST SURGICAL HISTORY: Past Surgical History:  Procedure Laterality Date  . cervical cryotherapy N/A 1999  . DILITATION & CURRETTAGE/HYSTROSCOPY WITH THERMACHOICE ABLATION  08/26/2013   Procedure: DILATATION & CURETTAGE/HYSTEROSCOPY WITH THERMACHOICE ENDOMETRIAL ABLATION Procedure #2 Total Therapy Time=min       sec;  Surgeon: Jonnie Kind, MD;  Location: [facility];  Service: Gynecology;;  . LAPAROSCOPIC BILATERAL SALPINGECTOMY Bilateral 08/26/2013   Procedure: LAPAROSCOPIC BILATERAL SALPINGECTOMY AND REMOVAL OF LEFT PERITUBAL CYST Procedure #1;  Surgeon: Jonnie Kind, MD;  Location: [facility];  Service: Gynecology;  Laterality: Bilateral;  . LAPAROSCOPIC LYSIS OF ADHESIONS  08/26/2013   Procedure: LAPAROSCOPIC LYSIS OF ADHESIONS Procedure #1;  Surgeon: Jonnie Kind, MD;  Location: [facility];  Service: Gynecology;;  . Carmelia Bake LESION REMOVAL  08/26/2013  Procedure: REMOVAL OF VULVAR SEBACEOUS CYST Procedure #3;  Surgeon: Jonnie Kind, MD;  Location: [facility];  Service: Gynecology;;    FAMILY HISTORY: Family History  Problem Relation Age of Onset  . Drug abuse Mother   . Hypertension Father   . Breast cancer Sister   . Hypertension Maternal Grandmother   . Hypertension Maternal Grandfather   . Hypertension Paternal Grandmother     SOCIAL HISTORY: Social History   Socioeconomic History  . Marital status: Single    Spouse name: Not on file  . Number of children: Not on file  . Years of education: Not on file  . Highest education level: Not on file  Occupational History  . Not on file  Social Needs  . Financial resource strain: Not on file  . Food insecurity    Worry: Not on file    Inability: Not on file  . Transportation needs    Medical: Not on file    Non-medical:  Not on file  Tobacco Use  . Smoking status: Former Smoker    Packs/day: 0.00    Types: Cigars    Quit date: 01/05/2017    Years since quitting: 2.1  . Smokeless tobacco: Never Used  Substance and Sexual Activity  . Alcohol use: Yes    Comment: occasional  . Drug use: No  . Sexual activity: Yes    Birth control/protection: None  Lifestyle  . Physical activity    Days per week: Not on file    Minutes per session: Not on file  . Stress: Not on file  Relationships  . Social Herbalist on phone: Not on file    Gets together: Not on file    Attends religious service: Not on file    Active member of club or organization: Not on file    Attends meetings of clubs or organizations: Not on file    Relationship status: Not on file  . Intimate partner violence    Fear of current or ex partner: Not on file    Emotionally abused: Not on file    Physically abused: Not on file    Forced sexual activity: Not on file  Other Topics Concern  . Not on file  Social History Narrative   Lives home with mother.  Works at [employer].  Education 10th grade/GED.  No children.  Single.        PHYSICAL EXAM  Vitals:   02/11/19 0820  BP: (!) 155/97  Pulse: 74  Temp: 97.7 F (36.5 C)  TempSrc: Oral  Weight: 177 lb 9.6 oz (80.6 kg)  Height: 5' (1.524 m)   Body mass index is 34.69 kg/m.  Generalized: Well developed, in no acute distress   Neurological examination  Mentation: Alert oriented to time, place, history taking. Follows all commands speech and language fluent Cranial nerve II-XII: Pupils were equal round reactive to light. Extraocular movements were full, visual field were full on confrontational test. Facial sensation and strength were normal.  Head turning and shoulder shrug  were normal and symmetric. Motor: The motor testing reveals 5 over 5 strength of all 4 extremities. Good symmetric motor tone is noted throughout.  Sensory: Sensory testing is intact to soft  touch on all 4 extremities. No evidence of extinction is noted.  Coordination: Cerebellar testing reveals good finger-nose-finger and heel-to-shin bilaterally.  Gait and station: Gait is normal. Tandem gait is normal. Romberg is negative. No drift is seen.  Reflexes: Deep  tendon reflexes are symmetric and normal bilaterally.   DIAGNOSTIC DATA (LABS, IMAGING, TESTING) - I reviewed patient records, labs, notes, testing and imaging myself where available.  Lab Results  Component Value Date   WBC 7.8 06/25/2018   HGB 13.9 06/25/2018   HCT 40.8 06/25/2018   MCV 88.3 06/25/2018   PLT 188 06/25/2018      Component Value Date/Time   NA 133 (L) 06/25/2018 0322   K 3.8 06/25/2018 0322   CL 104 06/25/2018 0322   CO2 21 (L) 06/25/2018 0322   GLUCOSE 108 (H) 06/25/2018 0322   BUN 7 06/25/2018 0322   CREATININE 0.58 06/25/2018 0322   CREATININE 0.75 06/10/2018 0831   CALCIUM 8.7 (L) 06/25/2018 0322   PROT 6.7 06/10/2018 0831   ALBUMIN 4.1 05/10/2016 1201   AST 15 06/10/2018 0831   ALT 16 06/10/2018 0831   ALKPHOS 66 05/10/2016 1201   BILITOT 0.4 06/10/2018 0831   GFRNONAA >60 06/25/2018 0322   GFRNONAA 98 06/10/2018 0831   GFRAA >60 06/25/2018 0322   GFRAA 114 06/10/2018 0831   Lab Results  Component Value Date   CHOL 191 06/10/2018   HDL 44 (L) 06/10/2018   LDLCALC 118 (H) 06/10/2018   TRIG 174 (H) 06/10/2018   CHOLHDL 4.3 06/10/2018   Lab Results  Component Value Date   HGBA1C 5.2 06/10/2018   Lab Results  Component Value Date   VITAMINB12 450 10/26/2010   Lab Results  Component Value Date   TSH 1.67 06/10/2018    ASSESSMENT AND PLAN 43 y.o. year old female  has a past medical history of Anemia, Anxiety, Depressive disorder, not elsewhere classified, Hypertension, Migraine, Migraines, Miscarriage (2009), Neck pain, and Vaginitis. here with:  1.  Chronic migraine headache, doing better, 2 migraines a month on average -Continue Aimovig 70 mg monthly injection, 50%  reduction in headache with Aimovig, now having 2 migraines a month -Continue Topamax 100 mg twice a day -Continue Imitrex injection, Aleve, Zanaflex as needed for prolonged severe headache -Return in 6 months or sooner if needed  I spent 15 minutes with the patient. 50% of this time was spent discussing her plan of care.   Butler Denmark, AGNP-C, DNP 02/11/2019, 8:38 AM [facility] [address] [place], [place] [address] [phone]

## 2019-02-11 ENCOUNTER — Ambulatory Visit (INDEPENDENT_AMBULATORY_CARE_PROVIDER_SITE_OTHER): Payer: Self-pay | Admitting: Neurology

## 2019-02-11 ENCOUNTER — Encounter: Payer: Self-pay | Admitting: Neurology

## 2019-02-11 ENCOUNTER — Other Ambulatory Visit: Payer: Self-pay

## 2019-02-11 VITALS — BP 155/97 | HR 74 | Temp 97.7°F | Ht 60.0 in | Wt 177.6 lb

## 2019-02-11 DIAGNOSIS — G43709 Chronic migraine without aura, not intractable, without status migrainosus: Secondary | ICD-10-CM

## 2019-02-11 DIAGNOSIS — IMO0002 Reserved for concepts with insufficient information to code with codable children: Secondary | ICD-10-CM

## 2019-02-11 MED ORDER — TIZANIDINE HCL 4 MG PO TABS: 4.0000 mg | ORAL_TABLET | Freq: Four times a day (QID) | ORAL | 5 refills | Status: DC | PRN

## 2019-02-11 NOTE — Patient Instructions (Signed)
1. Continue current medications  2. Return in 6 months

## 2019-02-12 ENCOUNTER — Other Ambulatory Visit: Payer: Self-pay

## 2019-02-12 ENCOUNTER — Ambulatory Visit (HOSPITAL_COMMUNITY)
Admission: RE | Admit: 2019-02-12 | Discharge: 2019-02-12 | Disposition: A | Discharge status: Home / Self Care | Payer: Self-pay | Source: Ambulatory Visit | Attending: Family Medicine | Admitting: Family Medicine

## 2019-02-12 DIAGNOSIS — N63 Unspecified lump in unspecified breast: Secondary | ICD-10-CM

## 2019-02-12 NOTE — Progress Notes (Signed)
I have reviewed and agreed above plan. 

## 2019-02-25 ENCOUNTER — Encounter (HOSPITAL_COMMUNITY): Payer: Self-pay

## 2019-03-13 ENCOUNTER — Telehealth: Payer: Self-pay | Admitting: *Deleted

## 2019-03-13 ENCOUNTER — Other Ambulatory Visit: Payer: Self-pay

## 2019-03-13 MED ORDER — AMLODIPINE BESYLATE 2.5 MG PO TABS: 2.5000 mg | ORAL_TABLET | Freq: Every day | ORAL | 0 refills | Status: DC

## 2019-03-13 NOTE — Telephone Encounter (Signed)
Medication refilled

## 2019-03-13 NOTE — Telephone Encounter (Signed)
Pt needs a refill on her BP medication

## 2019-04-10 ENCOUNTER — Encounter: Payer: Self-pay | Admitting: Family Medicine

## 2019-04-10 ENCOUNTER — Ambulatory Visit (INDEPENDENT_AMBULATORY_CARE_PROVIDER_SITE_OTHER): Payer: Self-pay | Admitting: Family Medicine

## 2019-04-10 ENCOUNTER — Other Ambulatory Visit: Payer: Self-pay

## 2019-04-10 DIAGNOSIS — J3089 Other allergic rhinitis: Secondary | ICD-10-CM

## 2019-04-10 DIAGNOSIS — R0981 Nasal congestion: Secondary | ICD-10-CM

## 2019-04-10 MED ORDER — CETIRIZINE HCL 10 MG PO TABS: 10.0000 mg | ORAL_TABLET | Freq: Every day | ORAL | 1 refills | Status: DC

## 2019-04-10 MED ORDER — FLUTICASONE PROPIONATE 50 MCG/ACT NA SUSP: 2.0000 | Freq: Every day | NASAL | 2 refills | Status: DC

## 2019-04-10 NOTE — Progress Notes (Signed)
Virtual Visit via Telephone Note   This visit type was conducted due to national recommendations for restrictions regarding the COVID-19 Pandemic (e.g. social distancing) in an effort to limit this patient's exposure and mitigate transmission in our community.  Due to her co-morbid illnesses, this patient is at least at moderate risk for complications without adequate follow up.  This format is felt to be most appropriate for this patient at this time.  The patient did not have access to video technology/had technical difficulties with video requiring transitioning to audio format only (telephone).  All issues noted in this document were discussed and addressed.  No physical exam could be performed with this format.    Evaluation Performed:  Follow-up visit  Date:  04/10/2019   ID:  Rebecca Boyd, Rebecca Boyd 1976/01/05, MRN [id number]  Patient Location: Home Provider Location: Office  Location of Patient: Home Location of Provider: Telehealth Consent was obtain for visit to be over via telehealth. I verified that I am speaking with the correct person using two identifiers.  PCP:  Fayrene Helper, MD   Chief Complaint:  Sinus congestion  History of Present Illness:    Rebecca Boyd is a 43 y.o. female with history of anxiety, depression, hypertension, migraines, neck pain among others.  Presents today with 2 days of sinus congestion.  Denies having any exposure to Covid or anybody that has similar symptoms around her.  Reports that her head is stopped up but nothing will come out when she blows her nose.  She denies having any fever, chills, cough, shortness of breath, itchy watery eyes, sore throat at this time.  No current modifying factors at this time.  Does have history of having allergies but has not had did take anything for them in the past.  Is allergic to Benadryl of note.  The patient does not have symptoms concerning for COVID-19 infection (fever, chills, cough, or  new shortness of breath).   Past Medical, Surgical, Social History, Allergies, and Medications have been Reviewed. Past Medical History:  Diagnosis Date  . Anemia   . Anxiety   . Depressive disorder, not elsewhere classified   . Hypertension   . Migraine   . Migraines   . Miscarriage 2009  . Neck pain   . Vaginitis    cyctitis   Past Surgical History:  Procedure Laterality Date  . cervical cryotherapy N/A 1999  . DILITATION & CURRETTAGE/HYSTROSCOPY WITH THERMACHOICE ABLATION  08/26/2013   Procedure: DILATATION & CURETTAGE/HYSTEROSCOPY WITH THERMACHOICE ENDOMETRIAL ABLATION Procedure #2 Total Therapy Time=min       sec;  Surgeon: Jonnie Kind, MD;  Location: [facility];  Service: Gynecology;;  . LAPAROSCOPIC BILATERAL SALPINGECTOMY Bilateral 08/26/2013   Procedure: LAPAROSCOPIC BILATERAL SALPINGECTOMY AND REMOVAL OF LEFT PERITUBAL CYST Procedure #1;  Surgeon: Jonnie Kind, MD;  Location: [facility];  Service: Gynecology;  Laterality: Bilateral;  . LAPAROSCOPIC LYSIS OF ADHESIONS  08/26/2013   Procedure: LAPAROSCOPIC LYSIS OF ADHESIONS Procedure #1;  Surgeon: Jonnie Kind, MD;  Location: [facility];  Service: Gynecology;;  . Carmelia Bake LESION REMOVAL  08/26/2013   Procedure: REMOVAL OF VULVAR SEBACEOUS CYST Procedure #3;  Surgeon: Jonnie Kind, MD;  Location: [facility];  Service: Gynecology;;     Current Meds  Medication Sig  . albuterol (VENTOLIN HFA) 108 (90 Base) MCG/ACT inhaler Inhale 2 puffs into the lungs every 6 (six) hours as needed for wheezing or shortness of breath.  . ALPRAZolam Duanne Moron) 1  MG tablet Take 1 tablet (1 mg total) by mouth at bedtime.  Marland Kitchen amLODipine (NORVASC) 2.5 MG tablet Take 1 tablet (2.5 mg total) by mouth daily.  Eduard Roux (AIMOVIG) 70 MG/ML SOAJ Inject 70 mg into the skin every 30 (thirty) days.  Marland Kitchen FLUoxetine (PROZAC) 20 MG capsule Take 1 capsule (20 mg total) by mouth daily.  . hydrOXYzine (ATARAX/VISTARIL) 25 MG tablet Take one tablet once daily ,by mouth ,  as needed , for itching  . oxyCODONE-acetaminophen (PERCOCET/ROXICET) 5-325 MG tablet Take one tablet by mouth once daily for bak pain  . oxyCODONE-acetaminophen (PERCOCET/ROXICET) 5-325 MG tablet Take one tablet by mouth once daily for back pain  . [START ON 04/24/2019] oxyCODONE-acetaminophen (PERCOCET/ROXICET) 5-325 MG tablet Take one tablet by mouth once daily for back pain  . QUEtiapine (SEROQUEL) 25 MG tablet Take 1 tablet (25 mg total) by mouth at bedtime.  . SUMAtriptan (IMITREX) 6 MG/0.5ML SOLN injection Use one injection at onset of migraine.  May repeat in 2 hrs, if needed.  Max dose: 2 inj/day. This is a 30 day prescription.  Marland Kitchen tiZANidine (ZANAFLEX) 4 MG tablet Take 1 tablet (4 mg total) by mouth every 6 (six) hours as needed for muscle spasms.  Marland Kitchen topiramate (TOPAMAX) 100 MG tablet Take 1 tablet (100 mg total) by mouth 2 (two) times daily.  Marland Kitchen triamcinolone cream (KENALOG) 0.1 % Apply to the skin neck down 1-2 times a day as needed for rash     Allergies:   Diphenhydramine hcl, Orange fruit, and Latex   Social History   Tobacco Use  . Smoking status: Former Smoker    Packs/day: 0.00    Types: Cigars    Quit date: 01/05/2017    Years since quitting: 2.2  . Smokeless tobacco: Never Used  Substance Use Topics  . Alcohol use: Yes    Comment: occasional  . Drug use: No     Family Hx: The patient's family history includes Breast cancer in her sister; Drug abuse in her mother; Hypertension in her father, maternal grandfather, maternal grandmother, and paternal grandmother.  ROS:   Please see the history of present illness.    All other systems reviewed and are negative.   Labs/Other Tests and Data Reviewed:     Recent Labs: 06/10/2018: ALT 16; TSH 1.67 06/25/2018: BUN 7; Creatinine, Ser 0.58; Hemoglobin 13.9; Platelets 188; Potassium 3.8; Sodium 133   Recent Lipid Panel Lab Results  Component Value Date/Time   CHOL 191 06/10/2018 08:31 AM   TRIG 174 (H) 06/10/2018 08:31  AM   HDL 44 (L) 06/10/2018 08:31 AM   CHOLHDL 4.3 06/10/2018 08:31 AM   LDLCALC 118 (H) 06/10/2018 08:31 AM    Wt Readings from Last 3 Encounters:  02/11/19 177 lb 9.6 oz (80.6 kg)  02/05/19 176 lb (79.8 kg)  12/30/18 176 lb (79.8 kg)     Objective:    Vital Signs:  There were no vitals taken for this visit.   GEN:  Alert and oriented RESPIRATORY:  No shortness of breath noted in conversation PSYCH:  Normal affect and mood good communication  ASSESSMENT & PLAN:    1. Congestion of nasal sinus Nasal congestion possibly related to onset of starting URI most likely viral related.  Other signs and symptoms not consistent with Covid, flu or any other virus at this time.  Mostly related to possible allergies.  We will be treating for allergies at this time.  Advised her to follow-up with [place] in a  week if symptoms do not improve or worsen.  At that time we could possibly look at treating for sinusitis if this is ongoing and continues.  Strongly recommend treatment for over-the-counter medications for symptom management like Tylenol for aches and pains, vitamin C, hydration, rest.  Reviewed side effects, risks and benefits of medication.  Patient acknowledged agreement and understanding of the plan.   - fluticasone (FLONASE) 50 MCG/ACT nasal spray; Place 2 sprays into both nostrils daily.  Dispense: 16 g; Refill: 2 - cetirizine (ZYRTEC ALLERGY) 10 MG tablet; Take 1 tablet (10 mg total) by mouth daily.  Dispense: 30 tablet; Refill: 1  2. Environmental and seasonal allergies Possible related to environmental and seasonal allergies.  Recent weather changes.  Does not take anything for allergies at this time.  Just has had 2 days at this time.  Possibly still related to virus do not want to treat with antibiotics given the current climate with Covid and overuse of antibiotics with virus treatment.  Reviewed side effects, risks and benefits of medication.   Patient acknowledged agreement and  understanding of the plan.    - fluticasone (FLONASE) 50 MCG/ACT nasal spray; Place 2 sprays into both nostrils daily.  Dispense: 16 g; Refill: 2 - cetirizine (ZYRTEC ALLERGY) 10 MG tablet; Take 1 tablet (10 mg total) by mouth daily.  Dispense: 30 tablet; Refill: 1    Time:   Today, I have spent 10 minutes with the patient with telehealth technology discussing the above problems.     Medication Adjustments/Labs and Tests Ordered: Current medicines are reviewed at length with the patient today.  Concerns regarding medicines are outlined above.   Tests Ordered: No orders of the defined types were placed in this encounter.   Medication Changes: Meds ordered this encounter  Medications  . fluticasone (FLONASE) 50 MCG/ACT nasal spray    Sig: Place 2 sprays into both nostrils daily.    Dispense:  16 g    Refill:  2    Order Specific Question:   Supervising Provider    Answer:   SIMPSON, MARGARET E [id number]  . cetirizine (ZYRTEC ALLERGY) 10 MG tablet    Sig: Take 1 tablet (10 mg total) by mouth daily.    Dispense:  30 tablet    Refill:  1    Order Specific Question:   Supervising Provider    Answer:   Fayrene Helper [id number]    Disposition:  Follow up as needed  Signed, Perlie Mayo, NP  04/10/2019 11:16 AM     [facility]

## 2019-04-10 NOTE — Patient Instructions (Signed)
  I appreciate the opportunity to provide you with care for your health and wellness. Today we discussed: nasal congestion-allergy versus viral related  Follow up: 05/21/2019  No labs or referrals today  I have sent in Flonase and Zyrtec for use for nasal congestion.  Take these as directed. You most likely have nasal congestion related to a upper respiratory viral infection.  Which is not usually treated with antibiotics.  Symptom management use Nasal saline spray to help ease congestion and dry nasal passages. Can be used throughout the day as needed. Avoid forceful blowing of nose. It will be common to have some blood if blowing nose a lot or if nose is very dry. Can you a humidifier at home as well. Remember to wash and air it out regularly. Tylenol (325 mg 2 tablets every 6 hours) body aches or headaches if you develop them. Can consider use of a Neti-pot to wash out sinus cavity (twice daily). Please be aware that you need to use distilled or boiled water for these. If chest congestion and or coughing develop can use Mucinex (twice daily) for cough and congestion with full glass of water.  If you have a sore throat warm fluids like hot tea or lemon water can help sooth this.   Please hydrate, rest, get fresh air daily and wash your hands well.  I hope you feel better soon.  I hope you have a wonderful, happy, safe, and healthy Holiday Season! See you in the New Year :)  Please continue to practice social distancing to keep you, your family, and our community safe.  If you must go out, please wear a mask and practice good handwashing.  It was a pleasure to see you and I look forward to continuing to work together on your health and well-being. Please do not hesitate to call the office if you need care or have questions about your care.  Have a wonderful day and week. With Gratitude, Cherly Beach, DNP, AGNP-BC

## 2019-04-14 ENCOUNTER — Encounter: Payer: Self-pay | Admitting: Licensed Clinical Social Worker

## 2019-04-14 ENCOUNTER — Telehealth: Payer: PRIVATE HEALTH INSURANCE | Admitting: Licensed Clinical Social Worker

## 2019-04-14 DIAGNOSIS — Z800 Family history of malignant neoplasm of digestive organs: Secondary | ICD-10-CM | POA: Insufficient documentation

## 2019-04-14 DIAGNOSIS — Z803 Family history of malignant neoplasm of breast: Secondary | ICD-10-CM | POA: Insufficient documentation

## 2019-04-14 DIAGNOSIS — Z801 Family history of malignant neoplasm of trachea, bronchus and lung: Secondary | ICD-10-CM | POA: Insufficient documentation

## 2019-04-14 NOTE — Progress Notes (Signed)
REFERRING PROVIDER: Fayrene Helper, MD [address] [place],  [place] [address]  PRIMARY PROVIDER:  Fayrene Helper, MD  PRIMARY REASON FOR VISIT:  1. Family history of breast cancer   2. Family history of colon cancer   3. Family history of lung cancer    I connected with Rebecca Boyd on 04/14/2019 at 12:50 PM EDT by [place] conference and verified that I am speaking with the correct person using two identifiers.    Patient location: home Provider location: clinic   HISTORY OF PRESENT ILLNESS:   Rebecca Boyd, a 43 y.o. female, was seen for a [facility] cancer genetics consultation at the request of Dr. Moshe Cipro due to a family history of cancer.  Rebecca Boyd presents to clinic today to discuss the possibility of a hereditary predisposition to cancer, genetic testing, and to further clarify her future cancer risks, as well as potential cancer risks for family members.   Rebecca Boyd is a 43 y.o. female with no personal history of cancer.    CANCER HISTORY:  Oncology History   No history exists.     RISK FACTORS:  Menarche was at age 58.  First live birth at age no children.  OCP use for approximately 6 months Ovaries intact: yes.  Hysterectomy: no.  Menopausal status: premenopausal.  HRT use: 0 years. Colonoscopy: no; not examined. Mammogram within the last year: yes. Number of breast biopsies: 0. Up to date with pelvic exams: yes. Any excessive radiation exposure in the past: no  Past Medical History:  Diagnosis Date   Anemia    Anxiety    Depressive disorder, not elsewhere classified    Family history of breast cancer    Family history of colon cancer    Family history of lung cancer    Hypertension    Migraine    Migraines    Miscarriage 2009   Neck pain    Vaginitis    cyctitis    Past Surgical History:  Procedure Laterality Date   cervical cryotherapy N/A 1999   DILITATION & CURRETTAGE/HYSTROSCOPY WITH THERMACHOICE  ABLATION  08/26/2013   Procedure: DILATATION & CURETTAGE/HYSTEROSCOPY WITH THERMACHOICE ENDOMETRIAL ABLATION Procedure #2 Total Therapy Time=min       sec;  Surgeon: Jonnie Kind, MD;  Location: [facility];  Service: Gynecology;;   LAPAROSCOPIC BILATERAL SALPINGECTOMY Bilateral 08/26/2013   Procedure: LAPAROSCOPIC BILATERAL SALPINGECTOMY AND REMOVAL OF LEFT PERITUBAL CYST Procedure #1;  Surgeon: Jonnie Kind, MD;  Location: [facility];  Service: Gynecology;  Laterality: Bilateral;   LAPAROSCOPIC LYSIS OF ADHESIONS  08/26/2013   Procedure: LAPAROSCOPIC LYSIS OF ADHESIONS Procedure #1;  Surgeon: Jonnie Kind, MD;  Location: [facility];  Service: Gynecology;;   VULVAR LESION REMOVAL  08/26/2013   Procedure: REMOVAL OF VULVAR SEBACEOUS CYST Procedure #3;  Surgeon: Jonnie Kind, MD;  Location: [facility];  Service: Gynecology;;    Social History   Socioeconomic History   Marital status: Single    Spouse name: Not on file   Number of children: Not on file   Years of education: Not on file   Highest education level: Not on file  Occupational History   Not on file  Tobacco Use   Smoking status: Former Smoker    Packs/day: 0.00    Types: Cigars    Quit date: 01/05/2017    Years since quitting: 2.2   Smokeless tobacco: Never Used  Substance and Sexual Activity   Alcohol use: Yes  Comment: occasional   Drug use: No   Sexual activity: Yes    Birth control/protection: None  Other Topics Concern   Not on file  Social History Narrative   Lives home with mother.  Works at [employer].  Education 10th grade/GED.  No children.  Single.     Social Determinants of Health   Financial Resource Strain:    Difficulty of Paying Living Expenses: Not on file  Food Insecurity:    Worried About Charity fundraiser in the Last Year: Not on file   [employer] of Food in the Last Year: Not on file  Transportation Needs:    Lack of Transportation (Medical): Not on file   Lack of  Transportation (Non-Medical): Not on file  Physical Activity:    Days of Exercise per Week: Not on file   Minutes of Exercise per Session: Not on file  Stress:    Feeling of Stress : Not on file  Social Connections:    Frequency of Communication with Friends and Family: Not on file   Frequency of Social Gatherings with Friends and Family: Not on file   Attends Religious Services: Not on file   Active Member of Clubs or Organizations: Not on file   Attends Archivist Meetings: Not on file   Marital Status: Not on file     FAMILY HISTORY:  We obtained a detailed, 4-generation family history.  Significant diagnoses are listed below: Family History  Problem Relation Age of Onset   Drug abuse Mother    Hypertension Father    Breast cancer Sister        dx 52   Hypertension Maternal Grandmother    Hypertension Maternal Grandfather    Hypertension Paternal Grandmother    Breast cancer Paternal Grandmother        dx late 92s   Colon cancer Cousin 49   Rebecca Boyd does not have children. She has 1 maternal half sister, 64, who was diagnosed with breast cancer at 31. Patient reports that her sister has not had genetic testing and was treated at [facility]. Patient also has 3 paternal half brothers, no cancers.  Rebecca Boyd mother is living at 40, no cancer history. Patient has 5 maternal uncles and 2 maternal aunts, all living in their 35s-60s with no cancer history. A maternal cousin did have colon cancer at 74 and is living at 34. Maternal grandmother died at 71 due to emphysema, grandfather died at 32 and had lung cancer.  Rebecca Boyd father is living at 7. Patient reports limited information about this side of the family. She is unsure exactly how many paternal aunts and uncles she has but knows of at least 3 paternal aunts, all deceased, no cancers she is aware of. No cancers she is aware of in paternal cousins. Paternal grandmother had breast cancer in her late  66s and died at 65. Grandfather died at 6, no cancer.  Rebecca Boyd is unaware of previous family history of genetic testing for hereditary cancer risks. Patient's maternal ancestors are of unknown descent, and paternal ancestors are of unknown descent. There is no reported Ashkenazi Jewish ancestry. There is no known consanguinity.  GENETIC COUNSELING ASSESSMENT: Rebecca Boyd is a 43 y.o. female with a family history which is somewhat suggestive of a hereditary cancer syndrome and predisposition to cancer. We, therefore, discussed and recommended the following at today's visit.   DISCUSSION: We discussed that 5 - 10% of breast cancer is hereditary,  with most cases associated with BRCA1/BRCA2 mutations.  There are other genes that can be associated with hereditary breast cancer syndromes, and there are other genes associated with other cancers like colon cancer. We discussed that her sister would actually be the most informative person to test since she has had cancer.We discussed that testing is beneficial for several reasons including knowing how to follow individuals for cancer screenings and understand if other family members could be at risk for cancer and allow them to undergo genetic testing.   We reviewed the characteristics, features and inheritance patterns of hereditary cancer syndromes. We also discussed genetic testing, including the appropriate family members to test, the process of testing, insurance coverage and turn-around-time for results. We discussed the implications of a negative, positive and/or variant of uncertain significant result. We recommended Rebecca Boyd pursue genetic testing for the [facility] CancerNext-Expanded gene panel.   The CancerNext-Expanded + RNAinsight gene panel offered by [facility] and includes sequencing and rearrangement analysis for the following 77 genes: IP, ALK, APC*, ATM*, AXIN2, BAP1, BARD1, BLM, BMPR1A, BRCA1*, BRCA2*, BRIP1*, CDC73, CDH1*,CDK4, CDKN1B,  CDKN2A, CHEK2*, CTNNA1, DICER1, FANCC, FH, FLCN, GALNT12, KIF1B, LZTR1, MAX, MEN1, MET, MLH1*, MSH2*, MSH3, MSH6*, MUTYH*, NBN, NF1*, NF2, NTHL1, PALB2*, PHOX2B, PMS2*, POT1, PRKAR1A, PTCH1, PTEN*, RAD51C*, RAD51D*,RB1, RECQL, RET, SDHA, SDHAF2, SDHB, SDHC, SDHD, SMAD4, SMARCA4, SMARCB1, SMARCE1, STK11, SUFU, TMEM127, TP53*,TSC1, TSC2, VHL and XRCC2 (sequencing and deletion/duplication); EGFR, EGLN1, HOXB13, KIT, MITF, PDGFRA, POLD1 and POLE (sequencing only); EPCAM and GREM1 (deletion/duplication only). DNA and RNA analyses performed for * genes.  Based on Ms. Mashaw's family history of cancer, she meets medical criteria for genetic testing. Despite that she meets criteria, she may still have an out of pocket cost. We discussed that if her out of pocket cost for testing is over $100, the laboratory will call and confirm whether she wants to proceed with testing.  If the out of pocket cost of testing is less than $100 she will be billed by the genetic testing laboratory.   Based on the patient's family history, a statistical model [employer]) was used to estimate her risk of developing breast cancer. This estimates her lifetime risk of developing breast cancer to be approximately 20.2%. This estimation is in the setting of negative test results and may change based on results. The patient's lifetime breast cancer risk is a preliminary estimate based on available information using one of several models endorsed by the [place] (ACS). The ACS recommends consideration of breast MRI screening as an adjunct to mammography for patients at high risk (defined as 20% or greater lifetime risk).     Ms. Lackman has been determined to be at high risk for breast cancer.  Therefore, we recommend that annual screening with mammography and breast MRI be performed.  We offered a referral to our high risk clinic, she preferred to discuss with her PCP. We discussed that Rebecca Boyd should discuss her individual  situation with her referring physician and determine a breast cancer screening plan with which they are both comfortable.    PLAN: After considering the risks, benefits, and limitations, Rebecca Boyd provided informed consent to pursue genetic testing and the blood sample was sent to [facility] for analysis of the CancerNext-Expanded Panel. Results should be available within approximately 2-3 weeks' time, at which point they will be disclosed by telephone to Rebecca Boyd, as will any additional recommendations warranted by these results. Rebecca Boyd will receive a summary of her genetic counseling  visit and a copy of her results once available. This information will also be available in Epic.   Based on Rebecca Boyd's family history, we recommended her sister have genetic counseling and testing. Rebecca Boyd will let us know if we can be of any assistance in coordinating genetic counseling and/or testing for this family member.   Rebecca Boyd questions were answered to her satisfaction today. Our contact information was provided should additional questions or concerns arise. Thank you for the referral and allowing [place] to share in the care of your patient.   Faith Rogue, MS, [facility] Genetic Counselor [email or web address] Phone: [phone]  The patient was seen for a total of 30 minutes in virtual genetic counseling.  Drs. Magrinat, Lindi Adie and/or Burr Medico were available for discussion regarding this case.   _______________________________________________________________________ For Office Staff:  Number of people involved in session: 1 Was an Intern/ student involved with case: no

## 2019-04-15 ENCOUNTER — Other Ambulatory Visit: Payer: Self-pay

## 2019-04-15 ENCOUNTER — Inpatient Hospital Stay: Payer: PRIVATE HEALTH INSURANCE | Attending: Genetic Counselor

## 2019-05-05 ENCOUNTER — Telehealth: Payer: Self-pay | Admitting: Licensed Clinical Social Worker

## 2019-05-05 ENCOUNTER — Encounter: Payer: Self-pay | Admitting: Licensed Clinical Social Worker

## 2019-05-05 ENCOUNTER — Ambulatory Visit: Payer: Self-pay | Admitting: Licensed Clinical Social Worker

## 2019-05-05 DIAGNOSIS — Z803 Family history of malignant neoplasm of breast: Secondary | ICD-10-CM

## 2019-05-05 DIAGNOSIS — Z1379 Encounter for other screening for genetic and chromosomal anomalies: Secondary | ICD-10-CM

## 2019-05-05 DIAGNOSIS — Z800 Family history of malignant neoplasm of digestive organs: Secondary | ICD-10-CM

## 2019-05-05 DIAGNOSIS — Z801 Family history of malignant neoplasm of trachea, bronchus and lung: Secondary | ICD-10-CM

## 2019-05-05 NOTE — Progress Notes (Signed)
HPI:  Rebecca Boyd was previously seen in the [facility] clinic due to a family history of cancer and concerns regarding a hereditary predisposition to cancer. Please refer to our prior cancer genetics clinic note for more information regarding our discussion, assessment and recommendations, at the time. Rebecca Boyd recent genetic test results were disclosed to her, as were recommendations warranted by these results. These results and recommendations are discussed in more detail below.  CANCER HISTORY:  Oncology History   No history exists.    FAMILY HISTORY:  We obtained a detailed, 4-generation family history.  Significant diagnoses are listed below: Family History  Problem Relation Age of Onset  . Drug abuse Mother   . Hypertension Father   . Breast cancer Sister        dx 48  . Hypertension Maternal Grandmother   . Hypertension Maternal Grandfather   . Hypertension Paternal Grandmother   . Breast cancer Paternal Grandmother        dx late 30s  . Colon cancer Cousin 89   Rebecca Boyd does not have children. She has 1 maternal half sister, 16, who was diagnosed with breast cancer at 11. Patient reports that her sister has not had genetic testing and was treated at [facility]. Patient also has 3 paternal half brothers, no cancers.  Rebecca Boyd mother is living at 66, no cancer history. Patient has 5 maternal uncles and 2 maternal aunts, all living in their 28s-60s with no cancer history. A maternal cousin did have colon cancer at 73 and is living at 28. Maternal grandmother died at 46 due to emphysema, grandfather died at 15 and had lung cancer.  Rebecca Boyd father is living at 20. Patient reports limited information about this side of the family. She is unsure exactly how many paternal aunts and uncles she has but knows of at least 3 paternal aunts, all deceased, no cancers she is aware of. No cancers she is aware of in paternal cousins. Paternal grandmother had breast cancer  in her late 58s and died at 54. Grandfather died at 61, no cancer.  Rebecca Boyd is unaware of previous family history of genetic testing for hereditary cancer risks. Patient's maternal ancestors are of unknown descent, and paternal ancestors are of unknown descent. There is no reported Ashkenazi Jewish ancestry. There is no known consanguinity.   GENETIC TEST RESULTS: Genetic testing reported out on 04/29/2019 through the [facility] CancerNext-Expanded+RNAinsight cancer panel found no pathogenic mutations. The CancerNext-Expanded + RNAinsight gene panel offered by [employer] and includes sequencing and rearrangement analysis for the following 77 genes: IP, ALK, APC*, ATM*, AXIN2, BAP1, BARD1, BLM, BMPR1A, BRCA1*, BRCA2*, BRIP1*, CDC73, CDH1*,CDK4, CDKN1B, CDKN2A, CHEK2*, CTNNA1, DICER1, FANCC, FH, FLCN, GALNT12, KIF1B, LZTR1, MAX, MEN1, MET, MLH1*, MSH2*, MSH3, MSH6*, MUTYH*, NBN, NF1*, NF2, NTHL1, PALB2*, PHOX2B, PMS2*, POT1, PRKAR1A, PTCH1, PTEN*, RAD51C*, RAD51D*,RB1, RECQL, RET, SDHA, SDHAF2, SDHB, SDHC, SDHD, SMAD4, SMARCA4, SMARCB1, SMARCE1, STK11, SUFU, TMEM127, TP53*,TSC1, TSC2, VHL and XRCC2 (sequencing and deletion/duplication); EGFR, EGLN1, HOXB13, KIT, MITF, PDGFRA, POLD1 and POLE (sequencing only); EPCAM and GREM1 (deletion/duplication only). DNA and RNA analyses performed for * genes. The test report has been scanned into EPIC and is located under the Molecular Pathology section of the Results Review tab.  A portion of the result report is included below for reference.     We discussed with Rebecca Boyd that because current genetic testing is not perfect, it is possible there may be a gene mutation in one of  these genes that current testing cannot detect, but that chance is small.  We also discussed, that there could be another gene that has not yet been discovered, or that we have not yet tested, that is responsible for the cancer diagnoses in the family. It is also possible there is a hereditary  cause for the cancer in the family that [name] did not inherit and therefore was not identified in her testing.  Therefore, it is important to remain in touch with cancer genetics in the future so that we can continue to offer Rebecca Boyd the most up to date genetic testing.   Genetic testing did identify a variant of uncertain significance (VUS) was identified in the [facility] gene called [id number].  At this time, it is unknown if this variant is associated with increased cancer risk or if this is a normal finding, but most variants such as this get reclassified to being inconsequential. It should not be used to make medical management decisions. With time, we suspect the lab will determine the significance of this variant, if any. If we do learn more about it, we will try to contact Rebecca Boyd to discuss it further. However, it is important to stay in touch with [place] periodically and keep the address and phone number up to date.  ADDITIONAL GENETIC TESTING: We discussed with Rebecca Boyd that her genetic testing was fairly extensive.  If there are genes identified to increase cancer risk that can be analyzed in the future, we would be happy to discuss and coordinate this testing at that time.    CANCER SCREENING RECOMMENDATIONS: Rebecca Boyd test result is considered negative (normal).  This means that we have not identified a hereditary cause for her  family history of cancer at this time.   While reassuring, this does not definitively rule out a hereditary predisposition to cancer. It is still possible that there could be genetic mutations that are undetectable by current technology. There could be genetic mutations in genes that have not been tested or identified to increase cancer risk.  Therefore, it is recommended she continue to follow the cancer management and screening guidelines provided by her primary healthcare provider.   An individual's cancer risk and medical management are not determined by genetic  test results alone. Overall cancer risk assessment incorporates additional factors, including personal medical history, family history, and any available genetic information that may result in a personalized plan for cancer prevention and surveillance.  Based on the patient's family history and negative genetic test results, a statistical model Harriett Rush) was used to estimate her risk of developing breast cancer. This estimates her lifetime risk of developing breast cancer to be approximately 20.2%. The patient's lifetime breast cancer risk is a preliminary estimate based on available information using one of several models endorsed by the [place] (ACS). The ACS recommends consideration of breast MRI screening as an adjunct to mammography for patients at high risk (defined as 20% or greater lifetime risk).     Ms. Besse has been determined to be at high risk for breast cancer. Therefore, we recommend that annual screening with mammography and breast MRI be performed.  We offered a referral to our high risk clinic, she preferred to discuss with her PCP. We discussed that Ms. Grismore should discuss her individual situation with her referring physician and determine a breast cancer screening plan with which they are both comfortable.   RECOMMENDATIONS FOR FAMILY MEMBERS:  Relatives in this family might  be at some increased risk of developing cancer, over the general population risk, simply due to the family history of cancer.  We recommended female relatives in this family have a yearly mammogram beginning at age 53, or 37 years younger than the earliest onset of cancer, an annual clinical breast exam, and perform monthly breast self-exams. Female relatives in this family should also have a gynecological exam as recommended by their primary provider. All family members should have a colonoscopy by age 70, or as directed by their physicians.  It is also possible there is a hereditary  cause for the cancer in Ms. Harts's family that she did not inherit and therefore was not identified in her.  Based on Ms. Vicars's family history, we recommended her sister have genetic counseling and testing. Ms. Knight will let us know if we can be of any assistance in coordinating genetic counseling and/or testing for these family members.  FOLLOW-UP: Lastly, we discussed with Ms. Smethers that cancer genetics is a rapidly advancing field and it is possible that new genetic tests will be appropriate for her and/or her family members in the future. We encouraged her to remain in contact with cancer genetics on an annual basis so we can update her personal and family histories and let her know of advances in cancer genetics that may benefit this family.   Our contact number was provided. Ms. Devora questions were answered to her satisfaction, and she knows she is welcome to call us at anytime with additional questions or concerns.   Faith Rogue, MS, [facility] Genetic Counselor [email or web address] Phone: [phone]

## 2019-05-05 NOTE — Telephone Encounter (Signed)
Revealed negative genetic testing.  Revealed that a VUS in FANCC was identified. This normal result is reassuring.  It is unlikely that there is an increased risk of cancer due to a mutation in one of these genes.  However, genetic testing is not perfect, and cannot definitively rule out a hereditary cause.  It will be important for her to keep in contact with genetics to learn if any additional testing may be needed in the future.    ° °

## 2019-05-15 ENCOUNTER — Telehealth: Payer: Self-pay | Admitting: *Deleted

## 2019-05-15 ENCOUNTER — Other Ambulatory Visit: Payer: Self-pay | Admitting: Family Medicine

## 2019-05-15 NOTE — Telephone Encounter (Signed)
Pt called needing a refill on her xanax sent to [facility]

## 2019-05-16 ENCOUNTER — Other Ambulatory Visit: Payer: Self-pay | Admitting: Family Medicine

## 2019-05-16 MED ORDER — ALPRAZOLAM 1 MG PO TABS: 1.0000 mg | ORAL_TABLET | Freq: Every day | ORAL | 5 refills | Status: DC

## 2019-05-16 NOTE — Telephone Encounter (Signed)
Prescribed, due 05/19/2019

## 2019-05-21 ENCOUNTER — Ambulatory Visit (INDEPENDENT_AMBULATORY_CARE_PROVIDER_SITE_OTHER): Payer: PRIVATE HEALTH INSURANCE | Admitting: Family Medicine

## 2019-05-21 ENCOUNTER — Other Ambulatory Visit: Payer: Self-pay

## 2019-05-21 ENCOUNTER — Telehealth: Payer: Self-pay

## 2019-05-21 ENCOUNTER — Other Ambulatory Visit (HOSPITAL_COMMUNITY)
Admission: RE | Admit: 2019-05-21 | Discharge: 2019-05-21 | Disposition: A | Discharge status: Home / Self Care | Payer: PRIVATE HEALTH INSURANCE | Source: Other Acute Inpatient Hospital | Attending: Family Medicine | Admitting: Family Medicine

## 2019-05-21 VITALS — BP 122/86 | HR 97 | Temp 97.6°F | Resp 16 | Ht 60.0 in | Wt 172.0 lb

## 2019-05-21 DIAGNOSIS — I10 Essential (primary) hypertension: Secondary | ICD-10-CM

## 2019-05-21 DIAGNOSIS — Z0289 Encounter for other administrative examinations: Secondary | ICD-10-CM

## 2019-05-21 DIAGNOSIS — M545 Low back pain, unspecified: Secondary | ICD-10-CM

## 2019-05-21 DIAGNOSIS — G43709 Chronic migraine without aura, not intractable, without status migrainosus: Secondary | ICD-10-CM | POA: Diagnosis not present

## 2019-05-21 DIAGNOSIS — E559 Vitamin D deficiency, unspecified: Secondary | ICD-10-CM

## 2019-05-21 DIAGNOSIS — E669 Obesity, unspecified: Secondary | ICD-10-CM

## 2019-05-21 DIAGNOSIS — Z13220 Encounter for screening for lipoid disorders: Secondary | ICD-10-CM

## 2019-05-21 DIAGNOSIS — F323 Major depressive disorder, single episode, severe with psychotic features: Secondary | ICD-10-CM | POA: Diagnosis not present

## 2019-05-21 DIAGNOSIS — IMO0002 Reserved for concepts with insufficient information to code with codable children: Secondary | ICD-10-CM

## 2019-05-21 MED ORDER — OXYCODONE-ACETAMINOPHEN 5-325 MG PO TABS: ORAL_TABLET | ORAL | 0 refills | Status: DC

## 2019-05-21 MED ORDER — FLUOXETINE HCL 20 MG PO TABS: 20.0000 mg | ORAL_TABLET | Freq: Every day | ORAL | 5 refills | Status: DC

## 2019-05-21 NOTE — Telephone Encounter (Signed)
Her pharmacy is out of percocet and called [facility] for her and they have it in stock so patient wants it sent there instead

## 2019-05-21 NOTE — Patient Instructions (Addendum)
F/U in 12 weeks , call if you need me sooner Please get fasting CBC, lipid, cmp and EGR, tSH and vit D 1 week before next visit  Please resume fluoxetine f or anxiety asnd depression  It is important that you exercise regularly at least 30 minutes 5 times a week. If you develop chest pain, have severe difficulty breathing, or feel very tired, stop exercising immediately and seek medical attention   Think about what you will eat, plan ahead. Choose " clean, green, fresh or frozen" over canned, processed or packaged foods which are more sugary, salty and fatty. 70 to 75% of food eaten should be vegetables and fruit. Three meals at set times with snacks allowed between meals, but they must be fruit or vegetables. Aim to eat over a 12 hour period , example 7 am to 7 pm, and STOP after  your last meal of the day. Drink water,generally about 64 ounces per day, no other drink is as healthy. Fruit juice is best enjoyed in a healthy way, by EATING the fruit.  Thanks for choosing [facility], we consider it a privelige to serve you.

## 2019-05-21 NOTE — Telephone Encounter (Signed)
Please send Percocet to [facility]

## 2019-05-22 ENCOUNTER — Other Ambulatory Visit: Payer: Self-pay | Admitting: Family Medicine

## 2019-05-22 MED ORDER — OXYCODONE-ACETAMINOPHEN 5-325 MG PO TABS: ORAL_TABLET | ORAL | 0 refills | Status: AC

## 2019-05-22 NOTE — Telephone Encounter (Signed)
D/c'd from [facility]

## 2019-05-22 NOTE — Telephone Encounter (Signed)
Sent to [facility], pls d/c order that was sent to [facility], thanks

## 2019-05-24 ENCOUNTER — Other Ambulatory Visit: Payer: Self-pay | Admitting: Family Medicine

## 2019-05-24 LAB — MISC LABCORP TEST (SEND OUT): Labcorp test code: 738526

## 2019-06-05 ENCOUNTER — Telehealth: Payer: Self-pay

## 2019-06-05 ENCOUNTER — Other Ambulatory Visit: Payer: Self-pay | Admitting: *Deleted

## 2019-06-05 MED ORDER — ALBUTEROL SULFATE HFA 108 (90 BASE) MCG/ACT IN AERS: 2.0000 | INHALATION_SPRAY | Freq: Four times a day (QID) | RESPIRATORY_TRACT | 0 refills | Status: DC | PRN

## 2019-06-05 MED ORDER — AMLODIPINE BESYLATE 2.5 MG PO TABS: 2.5000 mg | ORAL_TABLET | Freq: Every day | ORAL | 0 refills | Status: DC

## 2019-06-05 NOTE — Telephone Encounter (Signed)
Medication refilled

## 2019-06-05 NOTE — Telephone Encounter (Signed)
Pt is calling to get a refill on the inhaler & Amlodipine

## 2019-06-16 ENCOUNTER — Encounter: Payer: Self-pay | Admitting: Family Medicine

## 2019-06-16 MED ORDER — OXYCODONE HCL 5 MG PO TABS: ORAL_TABLET | ORAL | 0 refills | Status: DC

## 2019-06-16 MED ORDER — OXYCODONE HCL 5 MG PO TABS: ORAL_TABLET | ORAL | 0 refills | Status: AC

## 2019-06-16 MED ORDER — ALPRAZOLAM 1 MG PO TABS: 1.0000 mg | ORAL_TABLET | Freq: Every day | ORAL | 5 refills | Status: DC

## 2019-06-16 NOTE — Assessment & Plan Note (Signed)
Controlled on current regime and managed by Neurology °

## 2019-06-16 NOTE — Assessment & Plan Note (Signed)
Controlled, no change in medication  

## 2019-06-16 NOTE — Assessment & Plan Note (Signed)
Controlled, no change in medication DASH diet and commitment to daily physical activity for a minimum of 30 minutes discussed and encouraged, as a part of hypertension management. The importance of attaining a healthy weight is also discussed.  BP/Weight 05/21/2019 02/11/2019 02/05/2019 12/30/2018 12/02/2018 [id number]  Systolic BP [id number] - - [id number] 123456  Diastolic BP 86 97 78 - - 123XX123 76  Wt. (Lbs) 172 177.6 176 176 176 176 177.12  BMI 33.59 34.69 34.37 34.37 34.37 34.37 34.59  Some encounter information is confidential and restricted. Go to Review Flowsheets activity to see all data.

## 2019-06-16 NOTE — Progress Notes (Signed)
Rebecca Boyd     MRN: [id number]      DOB: Jan 12, 1976   HPI Rebecca Boyd is here for follow up and re-evaluation of chronic medical conditions, medication management and review of any available recent lab and radiology data.  Preventive health is updated, specifically  Cancer screening and Immunization.   Questions or concerns regarding consultations or procedures which the PT has had in the interim are  addressed. The PT denies any adverse reactions to current medications since the last visit.  There are no new concerns.  There are no specific complaints   ROS Denies recent fever or chills. Denies sinus pressure, nasal congestion, ear pain or sore throat. Denies chest congestion, productive cough or wheezing. Denies chest pains, palpitations and leg swelling Denies abdominal pain, nausea, vomiting,diarrhea or constipation.   Denies dysuria, frequency, hesitancy or incontinence. Denies uncontrolled  joint pain, swelling and limitation in mobility. Denies headaches, seizures, numbness, or tingling. Denies depression, anxiety or insomnia. Denies skin break down or rash.   PE  BP 122/86   Pulse 97   Temp 97.6 F (36.4 C) (Temporal)   Resp 16   Ht 5' (1.524 m)   Wt 172 lb (78 kg)   SpO2 98%   BMI 33.59 kg/m   Patient alert and oriented and in no cardiopulmonary distress.  HEENT: No facial asymmetry, EOMI,     Neck supple .  Chest: Clear to auscultation bilaterally.  CVS: S1, S2 no murmurs, no S3.Regular rate.  ABD: Soft non tender.   Ext: No edema  MS: Adequate ROM spine, shoulders, hips and knees.  Skin: Intact, no ulcerations or rash noted.  Psych: Good eye contact, normal affect. Memory intact not anxious or depressed appearing.  CNS: CN 2-12 intact, power,  normal throughout.no focal deficits noted.   Assessment & Plan  Essential hypertension, benign Controlled, no change in medication DASH diet and commitment to daily physical activity for a minimum  of 30 minutes discussed and encouraged, as a part of hypertension management. The importance of attaining a healthy weight is also discussed.  BP/Weight 05/21/2019 02/11/2019 02/05/2019 12/30/2018 12/02/2018 [id number]  Systolic BP [id number] - - [id number] [id number]  Diastolic BP 86 97 78 - - 123XX123 76  Wt. (Lbs) 172 177.6 176 176 176 176 177.12  BMI 33.59 34.69 34.37 34.37 34.37 34.37 34.59  Some encounter information is confidential and restricted. Go to Review Flowsheets activity to see all data.       Major depression with psychotic features (HCC) Controlled, no change in medication   Low back pain with radiation Controlled, no change in medication   Chronic migraine Controlled on current regime and managed by  Neurology  Obesity (BMI 30.0-34.9)  Patient re-educated about  the importance of commitment to a  minimum of 150 minutes of exercise per week as able.  The importance of healthy food choices with portion control discussed, as well as eating regularly and within a 12 hour window most days. The need to choose "clean , green" food 50 to 75% of the time is discussed, as well as to make water the primary drink and set a goal of 64 ounces water daily.    Weight /BMI 05/21/2019 02/11/2019 02/05/2019  WEIGHT 172 lb 177 lb 9.6 oz 176 lb  HEIGHT 5\' 0"  5\' 0"  5\' 0"   BMI 33.59 kg/m2 34.69 kg/m2 34.37 kg/m2  Some encounter information is confidential and restricted. Go to Review Flowsheets activity to see all  data.

## 2019-06-16 NOTE — Assessment & Plan Note (Signed)
  Patient re-educated about  the importance of commitment to a  minimum of 150 minutes of exercise per week as able.  The importance of healthy food choices with portion control discussed, as well as eating regularly and within a 12 hour window most days. The need to choose "clean , green" food 50 to 75% of the time is discussed, as well as to make water the primary drink and set a goal of 64 ounces water daily.    Weight /BMI 05/21/2019 02/11/2019 02/05/2019  WEIGHT 172 lb 177 lb 9.6 oz 176 lb  HEIGHT 5\' 0"  5\' 0"  5\' 0"   BMI 33.59 kg/m2 34.69 kg/m2 34.37 kg/m2  Some encounter information is confidential and restricted. Go to Review Flowsheets activity to see all data.

## 2019-06-23 ENCOUNTER — Other Ambulatory Visit: Payer: Self-pay | Admitting: Family Medicine

## 2019-07-07 ENCOUNTER — Emergency Department (HOSPITAL_COMMUNITY)
Admission: EM | Admit: 2019-07-07 | Discharge: 2019-07-07 | Disposition: A | Discharge status: Home / Self Care | Payer: PRIVATE HEALTH INSURANCE | Attending: Emergency Medicine | Admitting: Emergency Medicine

## 2019-07-07 ENCOUNTER — Other Ambulatory Visit: Payer: Self-pay

## 2019-07-07 ENCOUNTER — Encounter (HOSPITAL_COMMUNITY): Payer: Self-pay | Admitting: Emergency Medicine

## 2019-07-07 DIAGNOSIS — Z79899 Other long term (current) drug therapy: Secondary | ICD-10-CM | POA: Insufficient documentation

## 2019-07-07 DIAGNOSIS — Z87891 Personal history of nicotine dependence: Secondary | ICD-10-CM | POA: Insufficient documentation

## 2019-07-07 DIAGNOSIS — R040 Epistaxis: Secondary | ICD-10-CM | POA: Diagnosis not present

## 2019-07-07 DIAGNOSIS — I10 Essential (primary) hypertension: Secondary | ICD-10-CM | POA: Insufficient documentation

## 2019-07-07 DIAGNOSIS — Z91040 Latex allergy status: Secondary | ICD-10-CM | POA: Insufficient documentation

## 2019-07-07 MED ORDER — OXYMETAZOLINE HCL 0.05 % NA SOLN
1.0000 | Freq: Once | NASAL | Status: AC
  Administered 2019-07-07: 1 via NASAL
  Filled 2019-07-07: qty 30

## 2019-07-07 NOTE — Discharge Instructions (Signed)
Please start taking either Allegra, Claritin, or Zyrtec once a day.  These are allergy medications.  I would recommend you take them before you go to bed in case they make you drowsy she will already be asleep. If you got a small nosebleed I would recommend tipping your head forward and holding pressure on the fleshy part of your nose for 5 minutes. If after that you are still bleeding please blow your nose really well to get any blood clots out, use 2 squirts on each side and then hold continuous pressure with your head tipped forward for 15 minutes. Do not take the pressure off for the 15 minutes. After that if your nose continues to bleed please repeat this process once.  If after a total of 35 minutes of holding pressure you are still bleeding please return to the emergency room.  I suspect that your nose is bleeding due to a combination of dry air and seasonal allergies. Please consider using a coolmist humidifier in your room at night to help increase the humidity of the air. You may also use a saline nasal spray to help keep the inside of your nose moist.    If you notice concerning signs of bleeding including bruising on your chest or stomach without a known cause, you noticed significant bleeding when you brush your teeth or have any other concerns please seek additional medical care and evaluation.  While in the emergency room your blood pressure was high today.  This is most likely as a result of you being in the emergency room and not a cause of your nosebleed, however I would recommend that you follow-up with your primary care doctor in the next 2 weeks for a recheck as you may need adjustments in your medications.

## 2019-07-07 NOTE — ED Provider Notes (Signed)
[facility] EMERGENCY DEPARTMENT Provider Note   CSN: [id number] Arrival date & time: 07/07/19  1628     History Chief Complaint  Patient presents with  . Epistaxis    Rebecca Boyd is a 44 y.o. female with past medical history of anxiety, who presents today for evaluation of epistaxis. She reports that she was cooking dinner prior to arrival and had a sudden onset of a large nosebleed from her nares.  She denies any pain or trauma.  She does note that she has had a dry nose for the past 3 days.  She does not take any blood thinners.  She reports that at this time the leading appears to have stopped however she is concerned that she has never had anything like this before.    HPI     Past Medical History:  Diagnosis Date  . Anemia   . Anxiety   . Depressive disorder, not elsewhere classified   . Family history of breast cancer   . Family history of colon cancer   . Family history of lung cancer   . Hypertension   . Migraine   . Migraines   . Miscarriage 2009  . Neck pain   . Vaginitis    cyctitis    Patient Active Problem List   Diagnosis Date Noted  . Family history of breast cancer   . Family history of colon cancer   . Family history of lung cancer   . Breast pain, left 02/05/2019  . Pruritus 09/29/2018  . Nondisplaced fracture of fifth metatarsal bone, left foot, subsequent encounter for fracture with routine healing 08/15/18 09/25/2018  . Family history of breast cancer in sister 09/25/2018  . Pruritus ani 09/25/2018  . FH: breast cancer in relative when <44 years old 06/10/2018  . Major depression with psychotic features ([place]) 12/02/2017  . Rash and nonspecific skin eruption 05/14/2017  . Chronic migraine 12/06/2016  . Genetic testing 05/10/2016  . Headache 03/16/2016  . Obesity (BMI 30.0-34.9) 03/17/2015  . Essential hypertension, benign 01/07/2014  . Cervical neck pain with evidence of disc disease 10/02/2013  . Low back pain with radiation  07/30/2013  . 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 Vitamin D deficiency 10/27/2010  . Anxiety 10/26/2010    Past Surgical History:  Procedure Laterality Date  . cervical cryotherapy N/A 1999  . DILITATION & CURRETTAGE/HYSTROSCOPY WITH THERMACHOICE ABLATION  08/26/2013   Procedure: DILATATION & CURETTAGE/HYSTEROSCOPY WITH THERMACHOICE ENDOMETRIAL ABLATION Procedure #2 Total Therapy Time=min       sec;  Surgeon: Jonnie Kind, MD;  Location: [facility];  Service: Gynecology;;  . LAPAROSCOPIC BILATERAL SALPINGECTOMY Bilateral 08/26/2013   Procedure: LAPAROSCOPIC BILATERAL SALPINGECTOMY AND REMOVAL OF LEFT PERITUBAL CYST Procedure #1;  Surgeon: Jonnie Kind, MD;  Location: [facility];  Service: Gynecology;  Laterality: Bilateral;  . LAPAROSCOPIC LYSIS OF ADHESIONS  08/26/2013   Procedure: LAPAROSCOPIC LYSIS OF ADHESIONS Procedure #1;  Surgeon: Jonnie Kind, MD;  Location: [facility];  Service: Gynecology;;  . Carmelia Bake LESION REMOVAL  08/26/2013   Procedure: REMOVAL OF VULVAR SEBACEOUS CYST Procedure #3;  Surgeon: Jonnie Kind, MD;  Location: [facility];  Service: Gynecology;;     OB History   No obstetric history on file.     Family History  Problem Relation Age of Onset  . Drug abuse Mother   . Hypertension Father   . Breast cancer Sister        dx 37  . Hypertension Maternal Grandmother   .  Hypertension Maternal Grandfather   . Hypertension Paternal Grandmother   . Breast cancer Paternal Grandmother        dx late 88s  . Colon cancer Cousin 20    Social History   Tobacco Use  . Smoking status: Former Smoker    Packs/day: 0.00    Types: Cigars    Quit date: 01/05/2017    Years since quitting: 2.5  . Smokeless tobacco: Never Used  Substance Use Topics  . Alcohol use: Yes    Comment: occasional  . Drug use: No    Home Medications Prior to Admission medications   Medication Sig Start Date End Date Taking? Authorizing Provider  albuterol (VENTOLIN HFA) 108 (90 Base) MCG/ACT inhaler  Inhale 2 puffs into the lungs every 6 (six) hours as needed for wheezing or shortness of breath. 06/05/19   Fayrene Helper, MD  ALPRAZolam Duanne Moron) 1 MG tablet Take 1 tablet (1 mg total) by mouth at bedtime. 11/30/18   Fayrene Helper, MD  ALPRAZolam Duanne Moron) 1 MG tablet Take 1 tablet (1 mg total) by mouth at bedtime. 06/16/19   Fayrene Helper, MD  amLODipine (NORVASC) 2.5 MG tablet Take 1 tablet (2.5 mg total) by mouth daily. 06/05/19   Fayrene Helper, MD  cetirizine (ZYRTEC ALLERGY) 10 MG tablet Take 1 tablet (10 mg total) by mouth daily. 04/10/19   Perlie Mayo, NP  Erenumab-aooe (AIMOVIG) 70 MG/ML SOAJ Inject 70 mg into the skin every 30 (thirty) days. 07/31/18   [name], MD  FLUoxetine (PROZAC) 20 MG tablet Take 1 tablet (20 mg total) by mouth daily. 05/21/19   Fayrene Helper, MD  fluticasone (FLONASE) 50 MCG/ACT nasal spray Place 2 sprays into both nostrils daily. 04/10/19   Perlie Mayo, NP  hydrOXYzine (ATARAX/VISTARIL) 25 MG tablet Take one tablet once daily ,by mouth , as needed , for itching 02/05/19   Fayrene Helper, MD  metroNIDAZOLE (METROGEL) 0.75 % vaginal gel Place vaginally 2 (two) times daily. 03/07/19   [provider]  oxyCODONE (ROXICODONE) 5 MG immediate release tablet Take one tablet once daily for back pain 06/21/19 07/21/19  Fayrene Helper, MD  oxyCODONE (ROXICODONE) 5 MG immediate release tablet Take one tablet by mouth once daily for back pain 07/19/19 08/18/19  Fayrene Helper, MD  SUMAtriptan [facility]) 6 MG/0.5ML SOLN injection Use one injection at onset of migraine.  May repeat in 2 hrs, if needed.  Max dose: 2 inj/day. This is a 30 day prescription. 07/31/18   [name], MD  tiZANidine (ZANAFLEX) 4 MG tablet Take 1 tablet (4 mg total) by mouth every 6 (six) hours as needed for muscle spasms. 02/11/19   Suzzanne Cloud, NP  topiramate (TOPAMAX) 100 MG tablet Take 1 tablet (100 mg total) by mouth 2 (two) times daily. 07/31/18   [name],  MD  triamcinolone cream (KENALOG) 0.1 % Apply to the skin neck down 1-2 times a day as needed for rash 09/25/17   [provider]    Allergies    Diphenhydramine hcl, Orange fruit, and Latex  Review of Systems   Review of Systems  Constitutional: Negative for chills and fever.  HENT: Positive for nosebleeds. Negative for congestion, dental problem, facial swelling, postnasal drip, sinus pain, sore throat and trouble swallowing.   Cardiovascular: Negative for chest pain.  Gastrointestinal: Negative for abdominal pain.  Neurological: Negative for headaches.  Psychiatric/Behavioral: Negative for confusion.  All other systems reviewed and are negative.  Physical Exam Updated Vital Signs BP (!) 162/111 (BP Location: Right Arm)   Pulse 89   Temp 98.6 F (37 C) (Oral)   Resp 14   Ht 5' (1.524 m)   Wt 78.5 kg   SpO2 99%   BMI 33.79 kg/m   Physical Exam Vitals and nursing note reviewed.  Constitutional:      General: She is not in acute distress.    Appearance: She is not ill-appearing.  HENT:     Head: Normocephalic and atraumatic.     Nose:     Comments: Bilaterally the nasal septum has anterior area of dried blood without active bleeding at this time.   There is no bloody postnasal drip visualized in the oropharynx.    Mouth/Throat:     Mouth: Mucous membranes are moist.     Pharynx: No oropharyngeal exudate or posterior oropharyngeal erythema.  Eyes:     Conjunctiva/sclera: Conjunctivae normal.     Pupils: Pupils are equal, round, and reactive to light.  Cardiovascular:     Rate and Rhythm: Normal rate.  Pulmonary:     Effort: Pulmonary effort is normal. No respiratory distress.  Neurological:     Mental Status: She is alert.     ED Results / Procedures / Treatments   Labs (all labs ordered are listed, but only abnormal results are displayed) Labs Reviewed - No data to display  EKG None  Radiology No results found.  Procedures Procedures  (including critical care time)  Medications Ordered in ED Medications  oxymetazoline (AFRIN) 0.05 % nasal spray 1 spray (1 spray Each Nare Given 07/07/19 1813)    ED Course  I have reviewed the triage vital signs and the nursing notes.  Pertinent labs & imaging results that were available during my care of the patient were reviewed by me and considered in my medical decision making (see chart for details).    MDM Rules/Calculators/A&P                     Patient presents today for evaluation of nosebleed.  At the time of my evaluation her nose has stopped bleeding.  She does not take any blood thinning medications, denies other signs or symptoms of easy bruising/bleeding. Do not suspect underlying coagulopathy at this time.  Recommended conservative care including increasing humidity in the house, saline nasal sprays, taking OTC second-generation antihistamine.  We also discussed treatment for nosebleeds including using Afrin as needed and when to return to the emergency room.  Her blood pressure is slightly elevated, she reports that she was seen within the past 1 to 2 months by her primary care doctor and at that point her blood pressure was normal.  Advised her to get this rechecked in the next 1 to 2 weeks.  Return precautions were discussed with patient who states their understanding.  At the time of discharge patient denied any unaddressed complaints or concerns.  Patient is agreeable for discharge home.  Note: Portions of this report may have been transcribed using voice recognition software. Every effort was made to ensure accuracy; however, inadvertent computerized transcription errors may be present Final Clinical Impression(s) / ED Diagnoses Final diagnoses:  Anterior epistaxis    Rx / DC Orders ED Discharge Orders    None       Ollen Gross 07/07/19 2343    Fredia Sorrow, MD 07/10/19 1927

## 2019-07-07 NOTE — ED Triage Notes (Signed)
Nose bleed started while cooking.  Pt reports having dry nose for about 3 days.  Denies any pain or injury.  Reports Large amount of blood and blood clots were noted.  No active bleeding in triage.  C/o headache 6/10.  History of headaches.

## 2019-07-08 ENCOUNTER — Encounter (HOSPITAL_COMMUNITY): Payer: Self-pay | Admitting: Emergency Medicine

## 2019-07-08 ENCOUNTER — Other Ambulatory Visit: Payer: Self-pay

## 2019-07-08 ENCOUNTER — Emergency Department (HOSPITAL_COMMUNITY)
Admission: EM | Admit: 2019-07-08 | Discharge: 2019-07-08 | Disposition: A | Discharge status: Home / Self Care | Payer: Self-pay | Attending: Emergency Medicine | Admitting: Emergency Medicine

## 2019-07-08 DIAGNOSIS — Z87891 Personal history of nicotine dependence: Secondary | ICD-10-CM | POA: Insufficient documentation

## 2019-07-08 DIAGNOSIS — I10 Essential (primary) hypertension: Secondary | ICD-10-CM | POA: Insufficient documentation

## 2019-07-08 DIAGNOSIS — Z91040 Latex allergy status: Secondary | ICD-10-CM | POA: Insufficient documentation

## 2019-07-08 DIAGNOSIS — Z79899 Other long term (current) drug therapy: Secondary | ICD-10-CM | POA: Insufficient documentation

## 2019-07-08 DIAGNOSIS — R040 Epistaxis: Secondary | ICD-10-CM | POA: Diagnosis not present

## 2019-07-08 LAB — COMPREHENSIVE METABOLIC PANEL
ALT: 29 U/L (ref 0–44)
AST: 21 U/L (ref 15–41)
Albumin: 4.1 g/dL (ref 3.5–5.0)
Alkaline Phosphatase: 90 U/L (ref 38–126)
Anion gap: 7 (ref 5–15)
BUN: 17 mg/dL (ref 6–20)
CO2: 24 mmol/L (ref 22–32)
Calcium: 9.2 mg/dL (ref 8.9–10.3)
Chloride: 108 mmol/L (ref 98–111)
Creatinine, Ser: 0.69 mg/dL (ref 0.44–1.00)
GFR calc Af Amer: >60 mL/min (ref >60)
GFR calc non Af Amer: >60 mL/min (ref >60)
Glucose, Bld: 104 mg/dL — ABNORMAL HIGH (ref 70–99)
Potassium: 3.6 mmol/L (ref 3.5–5.1)
Sodium: 139 mmol/L (ref 135–145)
Total Bilirubin: 0.4 mg/dL (ref 0.3–1.2)
Total Protein: 7.4 g/dL (ref 6.5–8.1)

## 2019-07-08 LAB — CBC WITH DIFFERENTIAL/PLATELET
Abs Immature Granulocytes: 0.05 10*3/uL (ref 0.00–0.07)
Basophils Absolute: 0.1 10*3/uL (ref 0.0–0.1)
Basophils Relative: 1 %
Eosinophils Absolute: 0.1 10*3/uL (ref 0.0–0.5)
Eosinophils Relative: 2 %
HCT: 41.3 % (ref 36.0–46.0)
Hemoglobin: 14.1 g/dL (ref 12.0–15.0)
Immature Granulocytes: 1 %
Lymphocytes Relative: 25 %
Lymphs Abs: 1.9 10*3/uL (ref 0.7–4.0)
MCH: 32 pg (ref 26.0–34.0)
MCHC: 34.1 g/dL (ref 30.0–36.0)
MCV: 93.7 fL (ref 80.0–100.0)
Monocytes Absolute: 0.4 10*3/uL (ref 0.1–1.0)
Monocytes Relative: 5 %
Neutro Abs: 5.2 10*3/uL (ref 1.7–7.7)
Neutrophils Relative %: 66 %
Platelets: 208 10*3/uL (ref 150–400)
RBC: 4.41 MIL/uL (ref 3.87–5.11)
RDW: 13 % (ref 11.5–15.5)
WBC: 7.8 10*3/uL (ref 4.0–10.5)
nRBC: 0 % (ref 0.0–0.2)

## 2019-07-08 MED ORDER — OXYMETAZOLINE HCL 0.05 % NA SOLN
1.0000 | Freq: Once | NASAL | Status: DC
  Filled 2019-07-08: qty 30

## 2019-07-08 MED ORDER — TRANEXAMIC ACID FOR EPISTAXIS
500.0000 mg | Freq: Once | TOPICAL | Status: AC
  Administered 2019-07-08: 23:00:00 500 mg via TOPICAL
  Filled 2019-07-08 (×2): qty 10

## 2019-07-08 MED ORDER — ONDANSETRON 4 MG PO TBDP
4.0000 mg | ORAL_TABLET | Freq: Once | ORAL | Status: AC
  Administered 2019-07-08: 4 mg via ORAL
  Filled 2019-07-08: qty 1

## 2019-07-08 NOTE — Discharge Instructions (Signed)
Recommend stopping your Flonase and Hydroxyzine, these can cause dryness in the nose leading to nosebleeds. Use a saline spray as needed to moisturize nasal membranes. If bleeding returns, use Afrin, hold pressure for 20 minutes. Recommend returning to the ER for bleeding that will not stop, otherwise follow up with ENT- referral given.

## 2019-07-08 NOTE — ED Triage Notes (Signed)
Pt C/O nose bleed that started last night after being D/C from ER. Pt states she feels dizzy and head "feeling numb." Pt seen for same yesterday.

## 2019-07-08 NOTE — ED Provider Notes (Signed)
[facility] EMERGENCY DEPARTMENT Provider Note   CSN: [id number] Arrival date & time: 07/08/19  2022     History Chief Complaint  Patient presents with  . Epistaxis    Rebecca Boyd is a 44 y.o. female.  44 year old female presents with complaint of left side nosebleed today.  Patient was seen in the ER yesterday for same, states that she used her Afrin however her nose continues to bleed off and on all day today.  Reports clots at times.  Patient is not anticoagulated, no history of prior nosebleeds.        Past Medical History:  Diagnosis Date  . Anemia   . Anxiety   . Depressive disorder, not elsewhere classified   . Family history of breast cancer   . Family history of colon cancer   . Family history of lung cancer   . Hypertension   . Migraine   . Migraines   . Miscarriage 2009  . Neck pain   . Vaginitis    cyctitis    Patient Active Problem List   Diagnosis Date Noted  . Family history of breast cancer   . Family history of colon cancer   . Family history of lung cancer   . Breast pain, left 02/05/2019  . Pruritus 09/29/2018  . Nondisplaced fracture of fifth metatarsal bone, left foot, subsequent encounter for fracture with routine healing 08/15/18 09/25/2018  . Family history of breast cancer in sister 09/25/2018  . Pruritus ani 09/25/2018  . FH: breast cancer in relative when <21 years old 06/10/2018  . Major depression with psychotic features ([place]) 12/02/2017  . Rash and nonspecific skin eruption 05/14/2017  . Chronic migraine 12/06/2016  . Genetic testing 05/10/2016  . Headache 03/16/2016  . Obesity (BMI 30.0-34.9) 03/17/2015  . Essential hypertension, benign 01/07/2014  . Cervical neck pain with evidence of disc disease 10/02/2013  . Low back pain with radiation 07/30/2013  . 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 Vitamin D deficiency 10/27/2010  . Anxiety 10/26/2010    Past Surgical History:  Procedure Laterality Date  . cervical cryotherapy  N/A 1999  . DILITATION & CURRETTAGE/HYSTROSCOPY WITH THERMACHOICE ABLATION  08/26/2013   Procedure: DILATATION & CURETTAGE/HYSTEROSCOPY WITH THERMACHOICE ENDOMETRIAL ABLATION Procedure #2 Total Therapy Time=min       sec;  Surgeon: Jonnie Kind, MD;  Location: [facility];  Service: Gynecology;;  . LAPAROSCOPIC BILATERAL SALPINGECTOMY Bilateral 08/26/2013   Procedure: LAPAROSCOPIC BILATERAL SALPINGECTOMY AND REMOVAL OF LEFT PERITUBAL CYST Procedure #1;  Surgeon: Jonnie Kind, MD;  Location: [facility];  Service: Gynecology;  Laterality: Bilateral;  . LAPAROSCOPIC LYSIS OF ADHESIONS  08/26/2013   Procedure: LAPAROSCOPIC LYSIS OF ADHESIONS Procedure #1;  Surgeon: Jonnie Kind, MD;  Location: [facility];  Service: Gynecology;;  . Carmelia Bake LESION REMOVAL  08/26/2013   Procedure: REMOVAL OF VULVAR SEBACEOUS CYST Procedure #3;  Surgeon: Jonnie Kind, MD;  Location: [facility];  Service: Gynecology;;     OB History   No obstetric history on file.     Family History  Problem Relation Age of Onset  . Drug abuse Mother   . Hypertension Father   . Breast cancer Sister        dx 64  . Hypertension Maternal Grandmother   . Hypertension Maternal Grandfather   . Hypertension Paternal Grandmother   . Breast cancer Paternal Grandmother        dx late 21s  . Colon cancer Cousin 79    Social History  Tobacco Use  . Smoking status: Former Smoker    Packs/day: 0.00    Types: Cigars    Quit date: 01/05/2017    Years since quitting: 2.5  . Smokeless tobacco: Never Used  Substance Use Topics  . Alcohol use: Yes    Comment: occasional  . Drug use: No    Home Medications Prior to Admission medications   Medication Sig Start Date End Date Taking? Authorizing Provider  albuterol (VENTOLIN HFA) 108 (90 Base) MCG/ACT inhaler Inhale 2 puffs into the lungs every 6 (six) hours as needed for wheezing or shortness of breath. 06/05/19  Yes Fayrene Helper, MD  ALPRAZolam Duanne Moron) 1 MG tablet Take 1 tablet (1 mg  total) by mouth at bedtime. 06/16/19  Yes Fayrene Helper, MD  amLODipine (NORVASC) 2.5 MG tablet Take 1 tablet (2.5 mg total) by mouth daily. 06/05/19  Yes Fayrene Helper, MD  cetirizine (ZYRTEC ALLERGY) 10 MG tablet Take 1 tablet (10 mg total) by mouth daily. 04/10/19  Yes Perlie Mayo, NP  FLUoxetine (PROZAC) 20 MG tablet Take 1 tablet (20 mg total) by mouth daily. 05/21/19  Yes Fayrene Helper, MD  oxyCODONE (ROXICODONE) 5 MG immediate release tablet Take one tablet once daily for back pain Patient taking differently: Take 5 mg by mouth daily. For back pain 06/21/19 07/21/19 Yes Fayrene Helper, MD  SUMAtriptan [facility]) 6 MG/0.5ML SOLN injection Use one injection at onset of migraine.  May repeat in 2 hrs, if needed.  Max dose: 2 inj/day. This is a 30 day prescription. 07/31/18  Yes [name], MD  topiramate (TOPAMAX) 100 MG tablet Take 100 mg by mouth 2 (two) times daily.   Yes [provider]  oxyCODONE (ROXICODONE) 5 MG immediate release tablet Take one tablet by mouth once daily for back pain Patient not taking: Reported on 07/08/2019 07/19/19 08/18/19  Fayrene Helper, MD  fluticasone [facility]) 50 MCG/ACT nasal spray Place 2 sprays into both nostrils daily. 04/10/19 07/08/19  Perlie Mayo, NP    Allergies    Diphenhydramine hcl, Orange fruit, and Latex  Review of Systems   Review of Systems  Constitutional: Negative for fever.  HENT: Positive for nosebleeds. Negative for congestion and postnasal drip.   Allergic/Immunologic: Negative for immunocompromised state.  Hematological: Does not bruise/bleed easily.    Physical Exam Updated Vital Signs BP (!) 139/91   Pulse 92   Temp 98.3 F (36.8 C) (Oral)   Resp 16   SpO2 99%   Physical Exam Vitals and nursing note reviewed.  Constitutional:      General: She is not in acute distress.    Appearance: She is well-developed. She is not diaphoretic.  HENT:     Head: Normocephalic and atraumatic.     Nose:  No septal deviation.     Right Nostril: No epistaxis or septal hematoma.     Left Nostril: Epistaxis present. No septal hematoma.     Comments: Trace amount of bleeding left septum    Mouth/Throat:     Mouth: Mucous membranes are moist.  Pulmonary:     Effort: Pulmonary effort is normal.  Skin:    General: Skin is warm and dry.     Coloration: Skin is not pale.  Neurological:     Mental Status: She is alert and oriented to person, place, and time.  Psychiatric:        Behavior: Behavior normal.     ED Results / Procedures / Treatments  Labs (all labs ordered are listed, but only abnormal results are displayed) Labs Reviewed  COMPREHENSIVE METABOLIC PANEL - Abnormal; Notable for the following components:      Result Value   Glucose, Bld 104 (*)    All other components within normal limits  CBC WITH DIFFERENTIAL/PLATELET    EKG None  Radiology No results found.  Procedures .Epistaxis Management  Date/Time: 07/08/2019 9:52 PM Performed by: Tacy Learn, PA-C Authorized by: Tacy Learn, PA-C   Consent:    Consent obtained:  Verbal   Consent given by:  Patient   Risks discussed:  Bleeding, infection, nasal injury and pain   Alternatives discussed:  No treatment Anesthesia (see MAR for exact dosages):    Anesthesia method:  None Procedure details:    Treatment site:  L anterior   Treatment method:  Silver nitrate   Treatment complexity:  Limited   Treatment episode: initial   Post-procedure details:    Assessment:  Bleeding stopped   Patient tolerance of procedure:  Tolerated well, no immediate complications  .Epistaxis Management  Date/Time: 07/08/2019 11:28 PM Performed by: Tacy Learn, PA-C Authorized by: Tacy Learn, PA-C   Consent:    Consent obtained:  Verbal   Consent given by:  Patient   Risks discussed:  Bleeding, infection, nasal injury and pain   Alternatives discussed:  No treatment Anesthesia (see MAR for exact dosages):     Anesthesia method:  None Procedure details:    Treatment site:  L anterior   Repair method: TXA/gauze.   Treatment complexity:  Limited   Treatment episode: recurring   Post-procedure details:    Assessment:  Bleeding stopped   Patient tolerance of procedure:  Tolerated well, no immediate complications   (including critical care time)  Medications Ordered in ED Medications  tranexamic acid (CYKLOKAPRON) 1000 MG/10ML topical solution 500 mg (500 mg Topical Given 07/08/19 2238)  ondansetron (ZOFRAN-ODT) disintegrating tablet 4 mg (4 mg Oral Given 07/08/19 2231)    ED Course  I have reviewed the triage vital signs and the nursing notes.  Pertinent labs & imaging results that were available during my care of the patient were reviewed by me and considered in my medical decision making (see chart for details).  Clinical Course as of Jul 08 2326  Tue Jul 08, 1831  4685 44 year old female returns to the ER for ongoing nosebleeds despite using her Afrin at home.  On exam patient has small amount of bleeding from left side septum, cauterized and on recheck there is no further bleeding.  Patient states that she is feeling dizzy and numb all over.  Patient is not orthostatic.  Will draw CBC although offered reassurance that she does not appear to be anemic at this time.   [LM]  2243 Called to room, patient had episode of vomiting and return of nosebleed.  Arrived to find patient hyperventilating sitting on the side of the bed spitting up blood on occasion.  Patient was given gauze to hold and pinched nose shot while awaiting TXA.  Removal of gauze from nose, no further bleeding, no blood from right nare, does have clot on left side.  Patient was instructed to blow out all of this clot, then placed gauze soaked in TXA in left nare.  Patient was given Zofran for her nausea and vomiting.  Patient resting comfortably at this time.  CBC returns with normal H&H, no other significant findings.   [LM]  2314  Packing removed, no further  bleeding, will continue to monitor, if no additional bleeding will dc home with follow up with ENT.   [LM]    Clinical Course User Index [LM] Roque Lias   MDM Rules/Calculators/A&P                      Final Clinical Impression(s) / ED Diagnoses Final diagnoses:  Left-sided epistaxis    Rx / DC Orders ED Discharge Orders    None       Tacy Learn, PA-C 07/08/19 2328    Margette Fast, MD 07/10/19 1928

## 2019-07-09 ENCOUNTER — Other Ambulatory Visit: Payer: Self-pay

## 2019-07-09 ENCOUNTER — Encounter (HOSPITAL_COMMUNITY): Payer: Self-pay

## 2019-07-09 ENCOUNTER — Emergency Department (HOSPITAL_COMMUNITY)
Admission: EM | Admit: 2019-07-09 | Discharge: 2019-07-09 | Disposition: A | Discharge status: Home / Self Care | Payer: Self-pay | Attending: Emergency Medicine | Admitting: Emergency Medicine

## 2019-07-09 DIAGNOSIS — R040 Epistaxis: Secondary | ICD-10-CM | POA: Diagnosis not present

## 2019-07-09 DIAGNOSIS — Z79899 Other long term (current) drug therapy: Secondary | ICD-10-CM | POA: Insufficient documentation

## 2019-07-09 DIAGNOSIS — I10 Essential (primary) hypertension: Secondary | ICD-10-CM | POA: Insufficient documentation

## 2019-07-09 DIAGNOSIS — Z87891 Personal history of nicotine dependence: Secondary | ICD-10-CM | POA: Insufficient documentation

## 2019-07-09 DIAGNOSIS — Z91040 Latex allergy status: Secondary | ICD-10-CM | POA: Insufficient documentation

## 2019-07-09 HISTORY — DX: Epistaxis: R04.0

## 2019-07-09 LAB — CBC WITH DIFFERENTIAL/PLATELET
Abs Immature Granulocytes: 0.05 10*3/uL (ref 0.00–0.07)
Basophils Absolute: 0 10*3/uL (ref 0.0–0.1)
Basophils Relative: 0 %
Eosinophils Absolute: 0.1 10*3/uL (ref 0.0–0.5)
Eosinophils Relative: 2 %
HCT: 40.4 % (ref 36.0–46.0)
Hemoglobin: 13.8 g/dL (ref 12.0–15.0)
Immature Granulocytes: 1 %
Lymphocytes Relative: 21 %
Lymphs Abs: 1.9 10*3/uL (ref 0.7–4.0)
MCH: 31.9 pg (ref 26.0–34.0)
MCHC: 34.2 g/dL (ref 30.0–36.0)
MCV: 93.3 fL (ref 80.0–100.0)
Monocytes Absolute: 0.5 10*3/uL (ref 0.1–1.0)
Monocytes Relative: 5 %
Neutro Abs: 6.5 10*3/uL (ref 1.7–7.7)
Neutrophils Relative %: 71 %
Platelets: 208 10*3/uL (ref 150–400)
RBC: 4.33 MIL/uL (ref 3.87–5.11)
RDW: 13 % (ref 11.5–15.5)
WBC: 9.1 10*3/uL (ref 4.0–10.5)
nRBC: 0 % (ref 0.0–0.2)

## 2019-07-09 LAB — PROTIME-INR
INR: 1 (ref 0.8–1.2)
Prothrombin Time: 12.6 seconds (ref 11.4–15.2)

## 2019-07-09 MED ORDER — ACETAMINOPHEN 325 MG PO TABS
650.0000 mg | ORAL_TABLET | Freq: Once | ORAL | Status: AC
  Administered 2019-07-09: 650 mg via ORAL
  Filled 2019-07-09: qty 2

## 2019-07-09 MED ORDER — OXYMETAZOLINE HCL 0.05 % NA SOLN
1.0000 | Freq: Once | NASAL | Status: AC
  Administered 2019-07-09: 1 via NASAL
  Filled 2019-07-09: qty 30

## 2019-07-09 NOTE — ED Provider Notes (Signed)
[facility] EMERGENCY DEPARTMENT Provider Note   CSN: [id number] Arrival date & time: 07/09/19  1725     History Chief Complaint  Patient presents with  . Epistaxis    Rebecca Boyd is a 44 y.o. female.  Presents ER with nosebleed.  Seen in ER on 3/8 for nosebleed, and recurrent nosebleed yesterday, received cautery, TXA and had sensation of bleeding.  Subsequently went to ENT office earlier today, no active bleeding at the time they recommended return to ER if bleeding does recur.  Patient states bleeding happened around 2:00 this afternoon stopped but then started again around 5 PM so she came to the ER for further eval.  Bleeding coming from left nose.  Intermittently stops with direct pressure but then will start back up again.  Has not had prior issues with nosebleeds.  No bleeding disorder history, no chronic medical problems.  No allergies to medications.  HPI     Past Medical History:  Diagnosis Date  . Anemia   . Anxiety   . Depressive disorder, not elsewhere classified   . Family history of breast cancer   . Family history of colon cancer   . Family history of lung cancer   . Hypertension   . Migraine   . Migraines   . Miscarriage 2009  . Neck pain   . Vaginitis    cyctitis    Patient Active Problem List   Diagnosis Date Noted  . Family history of breast cancer   . Family history of colon cancer   . Family history of lung cancer   . Breast pain, left 02/05/2019  . Pruritus 09/29/2018  . Nondisplaced fracture of fifth metatarsal bone, left foot, subsequent encounter for fracture with routine healing 08/15/18 09/25/2018  . Family history of breast cancer in sister 09/25/2018  . Pruritus ani 09/25/2018  . FH: breast cancer in relative when <47 years old 06/10/2018  . 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 Rash and nonspecific skin eruption 05/14/2017  . Chronic migraine 12/06/2016  . Genetic testing 05/10/2016  . Headache 03/16/2016    . Obesity (BMI 30.0-34.9) 03/17/2015  . Essential hypertension, benign 01/07/2014  . Cervical neck pain with evidence of disc disease 10/02/2013  . Low back pain with radiation 07/30/2013  . 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 Vitamin D deficiency 10/27/2010  . Anxiety 10/26/2010    Past Surgical History:  Procedure Laterality Date  . cervical cryotherapy N/A 1999  . DILITATION & CURRETTAGE/HYSTROSCOPY WITH THERMACHOICE ABLATION  08/26/2013   Procedure: DILATATION & CURETTAGE/HYSTEROSCOPY WITH THERMACHOICE ENDOMETRIAL ABLATION Procedure #2 Total Therapy Time=min       sec;  Surgeon: Jonnie Kind, MD;  Location: [facility];  Service: Gynecology;;  . LAPAROSCOPIC BILATERAL SALPINGECTOMY Bilateral 08/26/2013   Procedure: LAPAROSCOPIC BILATERAL SALPINGECTOMY AND REMOVAL OF LEFT PERITUBAL CYST Procedure #1;  Surgeon: Jonnie Kind, MD;  Location: [facility];  Service: Gynecology;  Laterality: Bilateral;  . LAPAROSCOPIC LYSIS OF ADHESIONS  08/26/2013   Procedure: LAPAROSCOPIC LYSIS OF ADHESIONS Procedure #1;  Surgeon: Jonnie Kind, MD;  Location: [facility];  Service: Gynecology;;  . Carmelia Bake LESION REMOVAL  08/26/2013   Procedure: REMOVAL OF VULVAR SEBACEOUS CYST Procedure #3;  Surgeon: Jonnie Kind, MD;  Location: [facility];  Service: Gynecology;;     OB History   No obstetric history on file.     Family History  Problem Relation Age of Onset  . Drug abuse Mother   . Hypertension  Father   . Breast cancer Sister        dx 36  . Hypertension Maternal Grandmother   . Hypertension Maternal Grandfather   . Hypertension Paternal Grandmother   . Breast cancer Paternal Grandmother        dx late 68s  . Colon cancer Cousin 64    Social History   Tobacco Use  . Smoking status: Former Smoker    Packs/day: 0.00    Types: Cigars    Quit date: 01/05/2017    Years since quitting: 2.5  . Smokeless tobacco: Never Used  Substance Use Topics  . Alcohol use: Yes    Comment: occasional  . Drug use: No     Home Medications Prior to Admission medications   Medication Sig Start Date End Date Taking? Authorizing Provider  albuterol (VENTOLIN HFA) 108 (90 Base) MCG/ACT inhaler Inhale 2 puffs into the lungs every 6 (six) hours as needed for wheezing or shortness of breath. 06/05/19  Yes Fayrene Helper, MD  ALPRAZolam Duanne Moron) 1 MG tablet Take 1 tablet (1 mg total) by mouth at bedtime. 06/16/19  Yes Fayrene Helper, MD  amLODipine (NORVASC) 2.5 MG tablet Take 1 tablet (2.5 mg total) by mouth daily. 06/05/19  Yes Fayrene Helper, MD  cetirizine (ZYRTEC ALLERGY) 10 MG tablet Take 1 tablet (10 mg total) by mouth daily. 04/10/19  Yes Perlie Mayo, NP  FLUoxetine (PROZAC) 20 MG tablet Take 1 tablet (20 mg total) by mouth daily. 05/21/19  Yes Fayrene Helper, MD  oxyCODONE (ROXICODONE) 5 MG immediate release tablet Take one tablet once daily for back pain Patient taking differently: Take 5 mg by mouth daily. For back pain 06/21/19 07/21/19 Yes Fayrene Helper, MD  SUMAtriptan [facility]) 6 MG/0.5ML SOLN injection Use one injection at onset of migraine.  May repeat in 2 hrs, if needed.  Max dose: 2 inj/day. This is a 30 day prescription. 07/31/18  Yes [name], MD  topiramate (TOPAMAX) 100 MG tablet Take 100 mg by mouth 2 (two) times daily.   Yes [provider]  oxyCODONE (ROXICODONE) 5 MG immediate release tablet Take one tablet by mouth once daily for back pain Patient not taking: Reported on 07/08/2019 07/19/19 08/18/19  Fayrene Helper, MD  fluticasone [facility]) 50 MCG/ACT nasal spray Place 2 sprays into both nostrils daily. 04/10/19 07/08/19  Perlie Mayo, NP    Allergies    Diphenhydramine hcl, Orange fruit, and Latex  Review of Systems   Review of Systems  Constitutional: Negative for chills and fever.  HENT: Negative for ear pain and sore throat.   Eyes: Negative for pain and visual disturbance.  Respiratory: Negative for cough and shortness of breath.    Cardiovascular: Negative for chest pain and palpitations.  Gastrointestinal: Negative for abdominal pain and vomiting.  Genitourinary: Negative for dysuria and hematuria.  Musculoskeletal: Negative for arthralgias and back pain.  Skin: Negative for color change and rash.  Neurological: Negative for seizures and syncope.  All other systems reviewed and are negative.   Physical Exam Updated Vital Signs BP (!) 178/117 (BP Location: Right Arm)   Pulse 90   Temp 98.4 F (36.9 C) (Oral)   Resp 16   Ht 5' (1.524 m)   Wt 78.5 kg   LMP 06/25/2019   SpO2 99%   BMI 33.79 kg/m   Physical Exam Vitals and nursing note reviewed.  Constitutional:      General: She is not in acute distress.  Appearance: She is well-developed.  HENT:     Head: Normocephalic and atraumatic.     Nose:     Comments: Right nare appears normal, no active bleeding, left nare noted small blood ooze in anterior Eyes:     Conjunctiva/sclera: Conjunctivae normal.  Cardiovascular:     Rate and Rhythm: Normal rate and regular rhythm.     Heart sounds: No murmur.  Pulmonary:     Effort: Pulmonary effort is normal. No respiratory distress.     Breath sounds: Normal breath sounds.  Abdominal:     Palpations: Abdomen is soft.     Tenderness: There is no abdominal tenderness.  Musculoskeletal:     Cervical back: Neck supple.  Skin:    General: Skin is warm and dry.  Neurological:     Mental Status: She is alert.  Psychiatric:        Mood and Affect: Mood normal.        Behavior: Behavior normal.     ED Results / Procedures / Treatments   Labs (all labs ordered are listed, but only abnormal results are displayed) Labs Reviewed  PROTIME-INR  CBC WITH DIFFERENTIAL/PLATELET    EKG None  Radiology No results found.  Procedures .Epistaxis Management  Date/Time: 07/09/2019 11:32 PM Performed by: Lucrezia Starch, MD Authorized by: Lucrezia Starch, MD   Consent:    Consent obtained:   Verbal   Consent given by:  Patient   Risks discussed:  Bleeding, infection, nasal injury and pain   Alternatives discussed:  No treatment and delayed treatment Universal protocol:    Patient identity confirmed:  Verbally with patient Anesthesia (see MAR for exact dosages):    Anesthesia method:  None Procedure details:    Treatment site:  L anterior   Repair method: Rhino Rocket.   Treatment episode: recurring   Post-procedure details:    Assessment:  Bleeding stopped   Patient tolerance of procedure:  Tolerated well, no immediate complications Comments:     Inserted anterior Rhino Rocket into left nare, inflated 3 mL air.  Bleeding stopped, patient tolerated well.   (including critical care time)  Medications Ordered in ED Medications  oxymetazoline (AFRIN) 0.05 % nasal spray 1 spray (1 spray Each Nare Given 07/09/19 2038)  acetaminophen (TYLENOL) tablet 650 mg (650 mg Oral Given 07/09/19 2038)    ED Course  I have reviewed the triage vital signs and the nursing notes.  Pertinent labs & imaging results that were available during my care of the patient were reviewed by me and considered in my medical decision making (see chart for details).    MDM Rules/Calculators/A&P                      44 year old lady presented to ER with recurrent nosebleeds.  Prior epistaxis management included Afrin, TXA, cautery.  Saw ENT earlier today but did not have any active bleeding at the time.  Now with recurrent bleeding today.  Reviewed ER epistaxis management options, offered to attempt TXA, cautery again patient does not want to pursue these interventions.  Proceeded to place anterior rapid Rhino.  Successful cessation of bleeding.  Patient tolerated well.  Strongly recommended close follow-up with ENT, ideally to be seen tomorrow or on Friday.  Reviewed return precautions, stressed importance of close ENT follow-up, need for rapid rhino to be removed within 48 hours.    After the discussed  management above, the patient was determined to be safe for discharge.  The patient was in agreement with this plan and all questions regarding their care were answered.  ED return precautions were discussed and the patient will return to the ED with any significant worsening of condition.  Final Clinical Impression(s) / ED Diagnoses Final diagnoses:  Epistaxis    Rx / DC Orders ED Discharge Orders    None       Lucrezia Starch, MD 07/09/19 2335

## 2019-07-09 NOTE — ED Triage Notes (Signed)
Pt presents to ED today for nose bleed started at 1400 and again at 1700. Pt states she was seen at the ENT today and was told to return to ED if it continued.

## 2019-07-09 NOTE — Discharge Instructions (Signed)
Continue rapid Rhino as placed until you are seen in the ENT office.  Please call the ENT office tomorrow morning to get an appointment to be seen either tomorrow or on Friday.  You should not have this packing in longer than 48 hours as it increases your risk of infection after that point.  If you feel the pain worsens, you develop worsening bleeding, return to ER for recheck.

## 2019-07-10 ENCOUNTER — Other Ambulatory Visit: Payer: Self-pay

## 2019-07-10 ENCOUNTER — Observation Stay (HOSPITAL_COMMUNITY)
Admission: EM | Admit: 2019-07-10 | Discharge: 2019-07-11 | Disposition: A | Discharge status: Home / Self Care | Payer: Self-pay | Attending: Family Medicine | Admitting: Family Medicine

## 2019-07-10 DIAGNOSIS — R040 Epistaxis: Principal | ICD-10-CM | POA: Diagnosis present

## 2019-07-10 DIAGNOSIS — E669 Obesity, unspecified: Secondary | ICD-10-CM | POA: Insufficient documentation

## 2019-07-10 DIAGNOSIS — Z8249 Family history of ischemic heart disease and other diseases of the circulatory system: Secondary | ICD-10-CM | POA: Insufficient documentation

## 2019-07-10 DIAGNOSIS — Z87891 Personal history of nicotine dependence: Secondary | ICD-10-CM | POA: Insufficient documentation

## 2019-07-10 DIAGNOSIS — E876 Hypokalemia: Secondary | ICD-10-CM | POA: Diagnosis present

## 2019-07-10 DIAGNOSIS — F419 Anxiety disorder, unspecified: Secondary | ICD-10-CM | POA: Insufficient documentation

## 2019-07-10 DIAGNOSIS — Z91040 Latex allergy status: Secondary | ICD-10-CM | POA: Insufficient documentation

## 2019-07-10 DIAGNOSIS — Z79899 Other long term (current) drug therapy: Secondary | ICD-10-CM | POA: Insufficient documentation

## 2019-07-10 DIAGNOSIS — I10 Essential (primary) hypertension: Secondary | ICD-10-CM | POA: Insufficient documentation

## 2019-07-10 DIAGNOSIS — F329 Major depressive disorder, single episode, unspecified: Secondary | ICD-10-CM | POA: Insufficient documentation

## 2019-07-10 DIAGNOSIS — Z20822 Contact with and (suspected) exposure to covid-19: Secondary | ICD-10-CM | POA: Insufficient documentation

## 2019-07-10 DIAGNOSIS — Z888 Allergy status to other drugs, medicaments and biological substances status: Secondary | ICD-10-CM | POA: Insufficient documentation

## 2019-07-10 NOTE — ED Triage Notes (Signed)
Patient was seen last night for nose bleed. Patient returning tonight for the same. Patient has left nostril packed. Patient states bleeding started again tonight.

## 2019-07-11 ENCOUNTER — Encounter (HOSPITAL_COMMUNITY): Payer: Self-pay

## 2019-07-11 ENCOUNTER — Emergency Department (HOSPITAL_COMMUNITY)
Admission: EM | Admit: 2019-07-11 | Discharge: 2019-07-11 | Disposition: A | Discharge status: Home / Self Care | Payer: Self-pay | Attending: Emergency Medicine | Admitting: Emergency Medicine

## 2019-07-11 ENCOUNTER — Other Ambulatory Visit: Payer: Self-pay

## 2019-07-11 DIAGNOSIS — R040 Epistaxis: Secondary | ICD-10-CM

## 2019-07-11 DIAGNOSIS — I10 Essential (primary) hypertension: Secondary | ICD-10-CM | POA: Diagnosis not present

## 2019-07-11 DIAGNOSIS — Z03818 Encounter for observation for suspected exposure to other biological agents ruled out: Secondary | ICD-10-CM | POA: Diagnosis not present

## 2019-07-11 DIAGNOSIS — E876 Hypokalemia: Secondary | ICD-10-CM | POA: Diagnosis present

## 2019-07-11 DIAGNOSIS — Z09 Encounter for follow-up examination after completed treatment for conditions other than malignant neoplasm: Secondary | ICD-10-CM | POA: Insufficient documentation

## 2019-07-11 LAB — CBC
HCT: 33.2 % — ABNORMAL LOW (ref 36.0–46.0)
Hemoglobin: 11.1 g/dL — ABNORMAL LOW (ref 12.0–15.0)
MCH: 31.4 pg (ref 26.0–34.0)
MCHC: 33.4 g/dL (ref 30.0–36.0)
MCV: 94.1 fL (ref 80.0–100.0)
Platelets: 172 10*3/uL (ref 150–400)
RBC: 3.53 MIL/uL — ABNORMAL LOW (ref 3.87–5.11)
RDW: 13.1 % (ref 11.5–15.5)
WBC: 7.5 10*3/uL (ref 4.0–10.5)
nRBC: 0 % (ref 0.0–0.2)

## 2019-07-11 LAB — COMPREHENSIVE METABOLIC PANEL
ALT: 23 U/L (ref 0–44)
ALT: 21 U/L (ref 0–44)
AST: 16 U/L (ref 15–41)
AST: 15 U/L (ref 15–41)
Albumin: 4 g/dL (ref 3.5–5.0)
Albumin: 3.6 g/dL (ref 3.5–5.0)
Alkaline Phosphatase: 76 U/L (ref 38–126)
Alkaline Phosphatase: 67 U/L (ref 38–126)
Anion gap: 8 (ref 5–15)
Anion gap: 7 (ref 5–15)
BUN: 13 mg/dL (ref 6–20)
BUN: 12 mg/dL (ref 6–20)
CO2: 23 mmol/L (ref 22–32)
CO2: 24 mmol/L (ref 22–32)
Calcium: 8.8 mg/dL — ABNORMAL LOW (ref 8.9–10.3)
Calcium: 8.9 mg/dL (ref 8.9–10.3)
Chloride: 108 mmol/L (ref 98–111)
Chloride: 108 mmol/L (ref 98–111)
Creatinine, Ser: 0.67 mg/dL (ref 0.44–1.00)
Creatinine, Ser: 0.62 mg/dL (ref 0.44–1.00)
GFR calc Af Amer: >60 mL/min (ref >60)
GFR calc Af Amer: >60 mL/min (ref >60)
GFR calc non Af Amer: >60 mL/min (ref >60)
GFR calc non Af Amer: >60 mL/min (ref >60)
Glucose, Bld: 133 mg/dL — ABNORMAL HIGH (ref 70–99)
Glucose, Bld: 121 mg/dL — ABNORMAL HIGH (ref 70–99)
Potassium: 3 mmol/L — ABNORMAL LOW (ref 3.5–5.1)
Potassium: 3.5 mmol/L (ref 3.5–5.1)
Sodium: 139 mmol/L (ref 135–145)
Sodium: 139 mmol/L (ref 135–145)
Total Bilirubin: 0.5 mg/dL (ref 0.3–1.2)
Total Bilirubin: 0.8 mg/dL (ref 0.3–1.2)
Total Protein: 7.3 g/dL (ref 6.5–8.1)
Total Protein: 6.7 g/dL (ref 6.5–8.1)

## 2019-07-11 LAB — PROTIME-INR
INR: 1 (ref 0.8–1.2)
INR: 1 (ref 0.8–1.2)
Prothrombin Time: 12.8 seconds (ref 11.4–15.2)
Prothrombin Time: 13.2 seconds (ref 11.4–15.2)

## 2019-07-11 LAB — CBC WITH DIFFERENTIAL/PLATELET
Abs Immature Granulocytes: 0.06 10*3/uL (ref 0.00–0.07)
Basophils Absolute: 0 10*3/uL (ref 0.0–0.1)
Basophils Relative: 0 %
Eosinophils Absolute: 0.1 10*3/uL (ref 0.0–0.5)
Eosinophils Relative: 1 %
HCT: 35.4 % — ABNORMAL LOW (ref 36.0–46.0)
Hemoglobin: 12.1 g/dL (ref 12.0–15.0)
Immature Granulocytes: 1 %
Lymphocytes Relative: 17 %
Lymphs Abs: 1.8 10*3/uL (ref 0.7–4.0)
MCH: 31.8 pg (ref 26.0–34.0)
MCHC: 34.2 g/dL (ref 30.0–36.0)
MCV: 92.9 fL (ref 80.0–100.0)
Monocytes Absolute: 0.6 10*3/uL (ref 0.1–1.0)
Monocytes Relative: 6 %
Neutro Abs: 7.7 10*3/uL (ref 1.7–7.7)
Neutrophils Relative %: 75 %
Platelets: 197 10*3/uL (ref 150–400)
RBC: 3.81 MIL/uL — ABNORMAL LOW (ref 3.87–5.11)
RDW: 12.9 % (ref 11.5–15.5)
WBC: 10.2 10*3/uL (ref 4.0–10.5)
nRBC: 0 % (ref 0.0–0.2)

## 2019-07-11 LAB — HIV ANTIBODY (ROUTINE TESTING W REFLEX): HIV Screen 4th Generation wRfx: NONREACTIVE

## 2019-07-11 LAB — SARS CORONAVIRUS 2 (TAT 6-24 HRS): SARS Coronavirus 2: NEGATIVE

## 2019-07-11 MED ORDER — LABETALOL HCL 5 MG/ML IV SOLN
20.0000 mg | Freq: Once | INTRAVENOUS | Status: AC
  Administered 2019-07-11: 20 mg via INTRAVENOUS
  Filled 2019-07-11: qty 4

## 2019-07-11 MED ORDER — AMLODIPINE BESYLATE 5 MG PO TABS: 5.0000 mg | ORAL_TABLET | Freq: Every day | ORAL | 5 refills | Status: DC

## 2019-07-11 MED ORDER — ONDANSETRON HCL 4 MG/2ML IJ SOLN: 4.0000 mg | Freq: Four times a day (QID) | INTRAMUSCULAR | Status: DC | PRN

## 2019-07-11 MED ORDER — LABETALOL HCL 5 MG/ML IV SOLN
5.0000 mg | Freq: Once | INTRAVENOUS | Status: AC
  Administered 2019-07-11: 5 mg via INTRAVENOUS
  Filled 2019-07-11: qty 4

## 2019-07-11 MED ORDER — LABETALOL HCL 5 MG/ML IV SOLN
10.0000 mg | Freq: Four times a day (QID) | INTRAVENOUS | Status: DC | PRN
  Filled 2019-07-11: qty 4

## 2019-07-11 MED ORDER — LORAZEPAM 2 MG/ML IJ SOLN
0.5000 mg | Freq: Once | INTRAMUSCULAR | Status: AC
  Administered 2019-07-11: 0.5 mg via INTRAVENOUS
  Filled 2019-07-11: qty 1

## 2019-07-11 MED ORDER — CEPHALEXIN 500 MG PO CAPS: 500.0000 mg | ORAL_CAPSULE | Freq: Four times a day (QID) | ORAL | 0 refills | Status: AC

## 2019-07-11 MED ORDER — POTASSIUM CHLORIDE CRYS ER 20 MEQ PO TBCR
40.0000 meq | EXTENDED_RELEASE_TABLET | ORAL | Status: DC
  Administered 2019-07-11: 40 meq via ORAL
  Filled 2019-07-11: qty 2

## 2019-07-11 MED ORDER — AMLODIPINE BESYLATE 5 MG PO TABS
5.0000 mg | ORAL_TABLET | Freq: Once | ORAL | Status: AC
  Administered 2019-07-11: 5 mg via ORAL
  Filled 2019-07-11: qty 1

## 2019-07-11 MED ORDER — OXYMETAZOLINE HCL 0.05 % NA SOLN
1.0000 | Freq: Once | NASAL | Status: AC
  Administered 2019-07-11: 1 via NASAL
  Filled 2019-07-11: qty 30

## 2019-07-11 MED ORDER — ONDANSETRON HCL 4 MG PO TABS: 4.0000 mg | ORAL_TABLET | Freq: Four times a day (QID) | ORAL | Status: DC | PRN

## 2019-07-11 MED ORDER — ACETAMINOPHEN 325 MG PO TABS: 650.0000 mg | ORAL_TABLET | Freq: Four times a day (QID) | ORAL | Status: DC | PRN

## 2019-07-11 MED ORDER — POTASSIUM CHLORIDE CRYS ER 20 MEQ PO TBCR
40.0000 meq | EXTENDED_RELEASE_TABLET | Freq: Once | ORAL | Status: AC
  Administered 2019-07-11: 40 meq via ORAL
  Filled 2019-07-11: qty 2

## 2019-07-11 MED ORDER — LACTATED RINGERS IV BOLUS
1000.0000 mL | Freq: Once | INTRAVENOUS | Status: AC
  Administered 2019-07-11: 1000 mL via INTRAVENOUS

## 2019-07-11 MED ORDER — ACETAMINOPHEN 650 MG RE SUPP: 650.0000 mg | Freq: Four times a day (QID) | RECTAL | Status: DC | PRN

## 2019-07-11 NOTE — Progress Notes (Signed)
   Pt presented 07/10/19 with recurrent nosebleeds since 07/07/2019 in the setting of elevated BP  -Left nasal packing placed, BP control much improved -Patient was discharged home with discharge instructions  -However prior to leaving the ED patient noticed some blood oozing from the left nare-- --patient requesting ENT evaluation which is not available at [place] -I called and discussed this case with on-call ENT Dr. Janace Hoard  as per Dr. Janace Hoard he will be willing to see patient if patient is at [facility]  -- -After discussing treatment and transfer/discharge options with patient----patient has elected to have family member drive her to [facility] ---  Dr. Janace Hoard will be able to evaluate her if she is at [place]   -Patient does not need to be admitted to the hospital for medical reasons per se, patient just needs ENT evaluation due to recurrent left-sided nasal bleed since Monday, 07/07/2019 -Patient remains medically stable at the time of discharge   -EDP should call on-call ENT physician Dr. Janace Hoard when patient arrives at [facility], ED/fast-track area  -Patient is medically stable at the time of discharge  Roxan Hockey, MD

## 2019-07-11 NOTE — H&P (Signed)
History and Physical    Rebecca Boyd [id number] DOB: February 15, 1976 DOA: 07/10/2019  PCP: Fayrene Helper, MD (Confirm with patient/family/NH records and if not entered, this has to be entered at [facility] point of entry) Patient coming from: Home  I have personally briefly reviewed patient's old medical records in [place]  Chief Complaint: Epistaxis  HPI: Rebecca Boyd is a 44 y.o. female with medical history significant of benign essential hypertension, anxiety and migraines presented to ED for evaluation of recurrent epistaxis.  Patient states that this is her fourth visit during the last 4 days for same problem.  Patient states that she was seen by the ENT doctor who placed nasal packing in her left nare to prevent the nasal bleeding but she started having blood oozing around the packing in the left nare, mild bleeding from right nare and also bleeding from her throat.  Patient states that she is feeling dizzy and weak because of nasal bleeding.  Patient is not on any blood thinners and also denies any family history of bleeding disorders.  Patient denies fever, chills, headache, chest pain, nausea, vomiting, abdominal pain and urinary symptoms.  ED Course: The ED patient had temperature of 99.1, blood pressure 152/112, heart rate 128 and respiratory rate of 20.  Blood work showed hemoglobin of 12.1, potassium 3.0.  In ED patient was anxious and tachycardic with high blood pressure and was managed with a bolus of lactated Ringer, 1 dose of IV 5 mg labetalol and also given a dose of Ativan and potassium supplementation.  ED physician also contacted the ENT physician/Dr. Redmond Baseman who suggested to get the patient's blood pressure under control and if the nosebleeds still continues after controlling the blood pressure, he should be officially consulted for more definitive management of nosebleed.  Review of Systems: As per HPI otherwise 10 point review of systems negative.    Past  Medical History:  Diagnosis Date  . Anemia   . Anxiety   . Depressive disorder, not elsewhere classified   . Family history of breast cancer   . Family history of colon cancer   . Family history of lung cancer   . Hypertension   . Migraine   . Migraines   . Miscarriage 2009  . Neck pain   . Vaginitis    cyctitis    Past Surgical History:  Procedure Laterality Date  . cervical cryotherapy N/A 1999  . DILITATION & CURRETTAGE/HYSTROSCOPY WITH THERMACHOICE ABLATION  08/26/2013   Procedure: DILATATION & CURETTAGE/HYSTEROSCOPY WITH THERMACHOICE ENDOMETRIAL ABLATION Procedure #2 Total Therapy Time=min       sec;  Surgeon: Jonnie Kind, MD;  Location: [facility];  Service: Gynecology;;  . LAPAROSCOPIC BILATERAL SALPINGECTOMY Bilateral 08/26/2013   Procedure: LAPAROSCOPIC BILATERAL SALPINGECTOMY AND REMOVAL OF LEFT PERITUBAL CYST Procedure #1;  Surgeon: Jonnie Kind, MD;  Location: [facility];  Service: Gynecology;  Laterality: Bilateral;  . LAPAROSCOPIC LYSIS OF ADHESIONS  08/26/2013   Procedure: LAPAROSCOPIC LYSIS OF ADHESIONS Procedure #1;  Surgeon: Jonnie Kind, MD;  Location: [facility];  Service: Gynecology;;  . Carmelia Bake LESION REMOVAL  08/26/2013   Procedure: REMOVAL OF VULVAR SEBACEOUS CYST Procedure #3;  Surgeon: Jonnie Kind, MD;  Location: [facility];  Service: Gynecology;;     reports that she quit smoking about 2 years ago. Her smoking use included cigars. She smoked 0.00 packs per day. She has never used smokeless tobacco. She reports current alcohol use. She reports that she does not  use drugs.  Allergies  Allergen Reactions  . Diphenhydramine Hcl Anaphylaxis    (Benadryl)  . Orange Fruit Hives, Shortness Of Breath, Itching and Other (See Comments)    MAKES IT DIFFICULT TO BREATH  . Latex Rash    Family History  Problem Relation Age of Onset  . Drug abuse Mother   . Hypertension Father   . Breast cancer Sister        dx 49  . Hypertension Maternal Grandmother   .  Hypertension Maternal Grandfather   . Hypertension Paternal Grandmother   . Breast cancer Paternal Grandmother        dx late 59s  . Colon cancer Cousin 41    Prior to Admission medications   Medication Sig Start Date End Date Taking? Authorizing Provider  albuterol (VENTOLIN HFA) 108 (90 Base) MCG/ACT inhaler Inhale 2 puffs into the lungs every 6 (six) hours as needed for wheezing or shortness of breath. 06/05/19   Fayrene Helper, MD  ALPRAZolam Duanne Moron) 1 MG tablet Take 1 tablet (1 mg total) by mouth at bedtime. 06/16/19   Fayrene Helper, MD  amLODipine (NORVASC) 2.5 MG tablet Take 1 tablet (2.5 mg total) by mouth daily. 06/05/19   Fayrene Helper, MD  cetirizine (ZYRTEC ALLERGY) 10 MG tablet Take 1 tablet (10 mg total) by mouth daily. 04/10/19   Perlie Mayo, NP  FLUoxetine (PROZAC) 20 MG tablet Take 1 tablet (20 mg total) by mouth daily. 05/21/19   Fayrene Helper, MD  oxyCODONE (ROXICODONE) 5 MG immediate release tablet Take one tablet once daily for back pain Patient taking differently: Take 5 mg by mouth daily. For back pain 06/21/19 07/21/19  Fayrene Helper, MD  oxyCODONE (ROXICODONE) 5 MG immediate release tablet Take one tablet by mouth once daily for back pain Patient not taking: Reported on 07/08/2019 07/19/19 08/18/19  Fayrene Helper, MD  SUMAtriptan [facility]) 6 MG/0.5ML SOLN injection Use one injection at onset of migraine.  May repeat in 2 hrs, if needed.  Max dose: 2 inj/day. This is a 30 day prescription. 07/31/18   [name], MD  topiramate (TOPAMAX) 100 MG tablet Take 100 mg by mouth 2 (two) times daily.    [provider]  fluticasone (FLONASE) 50 MCG/ACT nasal spray Place 2 sprays into both nostrils daily. 04/10/19 07/08/19  Perlie Mayo, NP    Physical Exam: Vitals:   07/11/19 0130 07/11/19 0200 07/11/19 0230 07/11/19 0300  BP: (!) 137/95 (!) 142/102 (!) 125/95 122/87  Pulse: (!) 101 (!) 106 98 (!) 101  Resp: (!) 22 (!) 26 (!) 26 (!) 26   Temp:      TempSrc:      SpO2: 95% 96% 98% 94%  Weight:      Height:        Constitutional: NAD, calm, comfortable Vitals:   07/11/19 0130 07/11/19 0200 07/11/19 0230 07/11/19 0300  BP: (!) 137/95 (!) 142/102 (!) 125/95 122/87  Pulse: (!) 101 (!) 106 98 (!) 101  Resp: (!) 22 (!) 26 (!) 26 (!) 26  Temp:      TempSrc:      SpO2: 95% 96% 98% 94%  Weight:      Height:        General: Patient is a 44 year old African-American female in no acute distress and laying comfortably in the bed. Eyes: PERRL, lids and conjunctivae normal ENMT: Mucous membranes are moist. Posterior pharynx clear of any exudate or lesions.nasal packing placed in  left nare with dried blood in right nare and nasal packing soaked with dried blood.  No active nasal bleeding at this time. Neck: normal, supple, no masses, no thyromegaly Respiratory: clear to auscultation bilaterally, no wheezing, no crackles. Normal respiratory effort. No accessory muscle use.  Cardiovascular: Regular rate and rhythm, no murmurs / rubs / gallops. No extremity edema. 2+ pedal pulses. No carotid bruits.  Abdomen: no tenderness, no masses palpated. No hepatosplenomegaly. Bowel sounds positive.  Musculoskeletal: no clubbing / cyanosis. No joint deformity upper and lower extremities. Good ROM, no contractures. Normal muscle tone.  Skin: no rashes, lesions, ulcers. No induration Neurologic: CN 2-12 grossly intact. Sensation intact, DTR normal. Strength 5/5 in all 4.  Psychiatric: Normal judgment and insight. Alert and oriented x 3. Normal mood.    Labs on Admission: I have personally reviewed following labs and imaging studies  CBC: Recent Labs  Lab 07/08/19 2221 07/09/19 2006 07/11/19 0031  WBC 7.8 9.1 10.2  NEUTROABS 5.2 6.5 7.7  HGB 14.1 13.8 12.1  HCT 41.3 40.4 35.4*  MCV 93.7 93.3 92.9  PLT 208 208 [id number]   Basic Metabolic Panel: Recent Labs  Lab 07/08/19 2221 07/11/19 0031  NA 139 139  K 3.6 3.0*  CL 108 108  CO2 24  23  GLUCOSE 104* 133*  BUN 17 13  CREATININE 0.69 0.67  CALCIUM 9.2 8.8*   GFR: Estimated Creatinine Clearance: 85.2 mL/min (by C-G formula based on SCr of 0.67 mg/dL). Liver Function Tests: Recent Labs  Lab 07/08/19 2221 07/11/19 0031  AST 21 16  ALT 29 23  ALKPHOS 90 76  BILITOT 0.4 0.5  PROT 7.4 7.3  ALBUMIN 4.1 4.0   No results for input(s): LIPASE, AMYLASE in the last 168 hours. No results for input(s): AMMONIA in the last 168 hours. Coagulation Profile: Recent Labs  Lab 07/09/19 2006 07/11/19 0031  INR 1.0 1.0   Cardiac Enzymes: No results for input(s): CKTOTAL, CKMB, CKMBINDEX, TROPONINI in the last 168 hours. BNP (last 3 results) No results for input(s): PROBNP in the last 8760 hours. HbA1C: No results for input(s): HGBA1C in the last 72 hours. CBG: No results for input(s): GLUCAP in the last 168 hours. Lipid Profile: No results for input(s): CHOL, HDL, LDLCALC, TRIG, CHOLHDL, LDLDIRECT in the last 72 hours. Thyroid Function Tests: No results for input(s): TSH, T4TOTAL, FREET4, T3FREE, THYROIDAB in the last 72 hours. Anemia Panel: No results for input(s): VITAMINB12, FOLATE, FERRITIN, TIBC, IRON, RETICCTPCT in the last 72 hours. Urine analysis:    Component Value Date/Time   COLORURINE YELLOW 01/25/2016 [place] 01/25/2016 2344   LABSPEC 1.020 01/25/2016 2344   PHURINE 6.0 01/25/2016 2344   GLUCOSEU NEGATIVE 01/25/2016 2344   HGBUR LARGE (A) 01/25/2016 2344   HGBUR negative 02/12/2008 0907   BILIRUBINUR negative 08/08/2017 0922   KETONESUR NEGATIVE 01/25/2016 2344   PROTEINUR negative 08/08/2017 0922   PROTEINUR NEGATIVE 01/25/2016 2344   UROBILINOGEN 0.2 08/08/2017 0922   UROBILINOGEN 0.2 08/22/2013 0830   NITRITE negative 08/08/2017 0922   NITRITE NEGATIVE 01/25/2016 2344   LEUKOCYTESUR Negative 08/08/2017 0922    Radiological Exams on Admission: No results found.    Assessment/Plan Principal Problem:    Frequent  nosebleeds No active bleeding at this time and nasal packing present in left nare. ENT physician contacted by ER physician and he recommended to control the blood pressure first and if bleeding continues after the blood pressure is controlled, the ENT physician should be  officially consulted for more definitive treatment for epistaxis. Blood pressure is still 190/105 and IV labetalol 20 mg ordered.  We will continue to monitor the blood pressure.  Active Problems:    Hypokalemia Potassium repletion done in the ED and will continue to monitor BMP.    Accelerated hypertension Patient is placed on telemetry monitoring because of elevated blood pressure and tachycardia.  IV labetalol 20 mg ordered. Continue IV labetalol 10 mg every 8 hours as needed if the systolic blood pressure is above [id number] or diastolic blood pressure above 105. Patient is also advised to follow-up with her PCP for adjustment of blood pressure medications.  Patient is also advised about using low-salt diet and medications compliance.    DVT prophylaxis: No chemical prophylaxis because of acute nasal bleed.  Code Status: Full code  Disposition Plan:   Consults called: ENT physician on call was called by ED physician and he recommended to consult him officially if patient still having nasal bleeding after controlling the blood pressure to a normal range.  Admission status: Observation/telemetry   Edmonia Lynch MD [facility] Pager [phone]   If 7PM-7AM, please contact night-coverage [email or web address] Password   07/11/2019, 3:29 AM

## 2019-07-11 NOTE — ED Provider Notes (Signed)
Emergency Department Provider Note   I have reviewed the triage vital signs and the nursing notes.   HISTORY  Chief Complaint Epistaxis   HPI Rebecca Boyd is a 44 y.o. female with medical problems documented below who presents the emergency department today secondary to recurrent nosebleed.  Patient has been seen here at least 4 times in the last 4 days for the same.  She has been seen in office twice by otolaryngologist most recently earlier today where she had a posterior Merocel packing put in place.  She comes here as she is having bleeding from her left eye, posterior throat and some mild bleeding out of her right nare and then oozing around the packing in the left nare.   She feels slightly weak and slightly lightheaded compared to previously but not significantly so.  No other associated or modifying symptoms.    Past Medical History:  Diagnosis Date  . Anemia   . Anxiety   . Depressive disorder, not elsewhere classified   . Family history of breast cancer   . Family history of colon cancer   . Family history of lung cancer   . Hypertension   . Migraine   . Migraines   . Miscarriage 2009  . Neck pain   . Vaginitis    cyctitis    Patient Active Problem List   Diagnosis Date Noted  . Frequent nosebleeds 07/11/2019  . Family history of breast cancer   . Family history of colon cancer   . Family history of lung cancer   . Breast pain, left 02/05/2019  . Pruritus 09/29/2018  . Nondisplaced fracture of fifth metatarsal bone, left foot, subsequent encounter for fracture with routine healing 08/15/18 09/25/2018  . Family history of breast cancer in sister 09/25/2018  . Pruritus ani 09/25/2018  . FH: breast cancer in relative when <20 years old 06/10/2018  . 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 Rash and nonspecific skin eruption 05/14/2017  . Chronic migraine 12/06/2016  . Genetic testing 05/10/2016  . Headache 03/16/2016  . Obesity  (BMI 30.0-34.9) 03/17/2015  . Essential hypertension, benign 01/07/2014  . Cervical neck pain with evidence of disc disease 10/02/2013  . Low back pain with radiation 07/30/2013  . 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 Vitamin D deficiency 10/27/2010  . Anxiety 10/26/2010    Past Surgical History:  Procedure Laterality Date  . cervical cryotherapy N/A 1999  . DILITATION & CURRETTAGE/HYSTROSCOPY WITH THERMACHOICE ABLATION  08/26/2013   Procedure: DILATATION & CURETTAGE/HYSTEROSCOPY WITH THERMACHOICE ENDOMETRIAL ABLATION Procedure #2 Total Therapy Time=min       sec;  Surgeon: Jonnie Kind, MD;  Location: [facility];  Service: Gynecology;;  . LAPAROSCOPIC BILATERAL SALPINGECTOMY Bilateral 08/26/2013   Procedure: LAPAROSCOPIC BILATERAL SALPINGECTOMY AND REMOVAL OF LEFT PERITUBAL CYST Procedure #1;  Surgeon: Jonnie Kind, MD;  Location: [facility];  Service: Gynecology;  Laterality: Bilateral;  . LAPAROSCOPIC LYSIS OF ADHESIONS  08/26/2013   Procedure: LAPAROSCOPIC LYSIS OF ADHESIONS Procedure #1;  Surgeon: Jonnie Kind, MD;  Location: [facility];  Service: Gynecology;;  . Carmelia Bake LESION REMOVAL  08/26/2013   Procedure: REMOVAL OF VULVAR SEBACEOUS CYST Procedure #3;  Surgeon: Jonnie Kind, MD;  Location: [facility];  Service: Gynecology;;    Current Outpatient Rx  . Order #: [id number] Class: Normal  . Order #: [id number] Class: Normal  . Order #: [id number] Class: Normal  . Order #: [id number] Class: Normal  . Order #: [id number] Class: Normal  . Order #:  [id number] Class: Normal  . [START ON 07/19/2019] Order #: [id number] Class: Normal  . Order #: [id number] Class: Normal  . Order #: [id number] Class: Historical Med    Allergies Diphenhydramine hcl, Orange fruit, and Latex  Family History  Problem Relation Age of Onset  . Drug abuse Mother   . Hypertension Father   . Breast cancer Sister        dx 47  . Hypertension Maternal Grandmother   . Hypertension Maternal Grandfather   . Hypertension Paternal  Grandmother   . Breast cancer Paternal Grandmother        dx late 62s  . Colon cancer Cousin 8    Social History Social History   Tobacco Use  . Smoking status: Former Smoker    Packs/day: 0.00    Types: Cigars    Quit date: 01/05/2017    Years since quitting: 2.5  . Smokeless tobacco: Never Used  Substance Use Topics  . Alcohol use: Yes    Comment: occasional  . Drug use: No    Review of Systems  All other systems negative except as documented in the HPI. All pertinent positives and negatives as reviewed in the HPI. ____________________________________________   PHYSICAL EXAM:  VITAL SIGNS: ED Triage Vitals  Enc Vitals Group     BP 07/10/19 2357 (!) 152/112     Pulse Rate 07/10/19 2357 (!) 128     Resp 07/10/19 2357 20     Temp 07/10/19 2357 99.1 F (37.3 C)     Temp Source 07/10/19 2357 Oral     SpO2 07/10/19 2357 100 %     Weight 07/10/19 2352 170 lb (77.1 kg)     Height 07/10/19 2352 5\' 1"  (1.549 m)    Constitutional: Alert and oriented. Well appearing and in no acute distress. Eyes: Conjunctivae are normal. PERRL. EOMI. Head: Atraumatic. Nose: packing in place in left nare Mouth/Throat: Mucous membranes are moist.  With mild oozing of blood Neck: No stridor.  No meningeal signs.   Cardiovascular: Normal rate, regular rhythm. Good peripheral circulation. Grossly normal heart sounds.   Respiratory: Normal respiratory effort.  No retractions. Lungs CTAB. Gastrointestinal: Soft and nontender. No distention.  Musculoskeletal: No lower extremity tenderness nor edema. No gross deformities of extremities. Neurologic:  Normal speech and language. No gross focal neurologic deficits are appreciated.  Skin:  Skin is warm, dry and intact. No rash noted.   ____________________________________________   LABS (all labs ordered are listed, but only abnormal results are displayed)  Labs Reviewed  CBC WITH DIFFERENTIAL/PLATELET - Abnormal; Notable for the following  components:      Result Value   RBC 3.81 (*)    HCT 35.4 (*)    All other components within normal limits  COMPREHENSIVE METABOLIC PANEL - Abnormal; Notable for the following components:   Potassium 3.0 (*)    Glucose, Bld 133 (*)    Calcium 8.8 (*)    All other components within normal limits  PROTIME-INR  HIV ANTIBODY (ROUTINE TESTING W REFLEX)  COMPREHENSIVE METABOLIC PANEL  CBC  PROTIME-INR   ____________________________________________  INITIAL IMPRESSION / ASSESSMENT AND PLAN / ED COURSE  Patient with multiple visits for what ENT thinks is likely a posterior nosebleed.  She already has appropriate packing in place and is bleeding around it.  Discussed with Dr. Redmond Baseman on-call for ear nose and throat.  He suggests get her blood pressure under control and if still not hemostatic then consulted him officially for more definitive management of the nosebleed.  As the patient has had epistaxis for a few days I am not entirely clear that the hypertension is the cause of it. I think she likely needs observation in the hospital to ensure this improves and can work on blood pressure control at that time and consult ENT as needed.   Clinical Course as of Jul 10 229  Fri Jul 11, 2019  0140 Reevaluated and still with some bleeding posteriorly. Will allow PO intake. Ativan and labetalol without change in BP. May need admission for same.    [JM]  0141 good  INR: 1.0 [JM]  0141 Will PO repletion  Potassium(!): 3.0 [JM]  0141 Slight drop but not alarming  Hemoglobin: 12.1 [JM]    Clinical Course User Index [JM] Blaize Nipper, Corene Cornea, MD    Pertinent labs & imaging results that were available during my care of the patient were reviewed by me and considered in my medical decision making (see chart for details). ____________________________________________  FINAL CLINICAL IMPRESSION(S) / ED DIAGNOSES  Final diagnoses:  Epistaxis  Hypertension, unspecified type    MEDICATIONS GIVEN DURING  THIS VISIT:  Medications  acetaminophen (TYLENOL) tablet 650 mg (has no administration in time range)    Or  acetaminophen (TYLENOL) suppository 650 mg (has no administration in time range)  ondansetron (ZOFRAN) tablet 4 mg (has no administration in time range)    Or  ondansetron (ZOFRAN) injection 4 mg (has no administration in time range)  labetalol (NORMODYNE) injection 20 mg (has no administration in time range)  labetalol (NORMODYNE) injection 5 mg (5 mg Intravenous Given 07/11/19 0014)  lactated ringers bolus 1,000 mL (0 mLs Intravenous Stopped 07/11/19 0217)  LORazepam (ATIVAN) injection 0.5 mg (0.5 mg Intravenous Given 07/11/19 0026)  potassium chloride SA (KLOR-CON) CR tablet 40 mEq (40 mEq Oral Given 07/11/19 0205)     NEW OUTPATIENT MEDICATIONS STARTED DURING THIS VISIT:  New Prescriptions   No medications on file    Note:  This note was prepared with assistance of Dragon voice recognition software. Occasional wrong-word or sound-a-like substitutions may have occurred due to the inherent limitations of voice recognition software.   Raymel Cull, Corene Cornea, MD 07/11/19 [phone]

## 2019-07-11 NOTE — ED Notes (Signed)
Dr. Janace Hoard paged @ 1525-per [place], RN paged by Levada Dy

## 2019-07-11 NOTE — Progress Notes (Signed)
   Subjective/Chief Complaint: epistaxis   Objective: Vital signs in last 24 hours: Temp:  [98.7 F (37.1 C)-99.1 F (37.3 C)] 98.7 F (37.1 C) (03/12 1517) Pulse Rate:  [90-128] 103 (03/12 1515) Resp:  [14-29] 16 (03/12 1515) BP: (122-152)/(84-116) 146/97 (03/12 1515) SpO2:  [94 %-100 %] 98 % (03/12 1515) Weight:  [74.8 kg-77.1 kg] 74.8 kg (03/12 1517)    Intake/Output from previous day: No intake/output data recorded. Intake/Output this shift: No intake/output data recorded.  pack in place on the left side. no bleeding. no blood per mouth. eyes normal   Lab Results:  Recent Labs    07/11/19 0031 07/11/19 0629  WBC 10.2 7.5  HGB 12.1 11.1*  HCT 35.4* 33.2*  PLT 197 172   BMET Recent Labs    07/11/19 0031 07/11/19 0629  NA 139 139  K 3.0* 3.5  CL 108 108  CO2 23 24  GLUCOSE 133* 121*  BUN 13 12  CREATININE 0.67 0.62  CALCIUM 8.8* 8.9   PT/INR Recent Labs    07/11/19 0031 07/11/19 0629  LABPROT 12.8 13.2  INR 1.0 1.0   ABG No results for input(s): PHART, HCO3 in the last 72 hours.  Invalid input(s): PCO2, PO2  Studies/Results: No results found.  Anti-infectives: Anti-infectives (From admission, onward)   None      Assessment/Plan: s/p * No surgery found * we discussed her epistaxis issue and she has been packed with the large merocel since yesterday. She has had 3 bleds . we discussed removing this pack and further examining the nose. and packing again. She wants to keep this pack foir a few more days. she will follow up if further bleeding. noted she has dropped HH.   LOS: 0 days    Melissa Montane 07/11/2019

## 2019-07-11 NOTE — Discharge Instructions (Signed)
1) please follow-up with the primary care physician on Monday, 07/14/2019 for removal of left nasal packing 2) please take Keflex antibiotics as prescribed while you have the left nasal packing  3) please note that your amlodipine has been changed to 5 mg daily for better blood pressure control, high blood pressure will make you more likely to get additional nosebleeds 4) please call or return if further concerns about nosebleeds

## 2019-07-11 NOTE — ED Notes (Signed)
Dr. Janace Hoard (ENT) at bedside

## 2019-07-11 NOTE — ED Notes (Signed)
Patient verbalizes understanding of discharge instructions. Opportunity for questioning and answers were provided. Pt discharged from ED. 

## 2019-07-11 NOTE — ED Triage Notes (Signed)
Pt from [facility], sent for further evaluation of epistaxis; L nostril packed, bleeding controlled at this time

## 2019-07-11 NOTE — ED Notes (Signed)
Report given to [place], charge nurse [facility] of patient impending arrival by private vehicle.

## 2019-07-11 NOTE — Discharge Instructions (Signed)
Keep the packing in place. A little blood oozing is normal, but a lot is not.  Drink plenty of fluids.  Follow-up with the ENT for reevaluation of your symptoms as discussed with you and Dr. Janace Hoard.  Return to the emergency department if any concerning signs or symptoms develop.

## 2019-07-11 NOTE — Discharge Summary (Signed)
Rebecca Boyd, is a 44 y.o. female  DOB 08-Nov-1975  MRN [id number].  Admission date:  07/10/2019  Admitting Physician  Edmonia Lynch, DO  Discharge Date:  07/11/2019   Primary MD  Fayrene Helper, MD  Recommendations for primary care physician for things to follow:    1) please follow-up with the primary care physician on Monday, 07/14/2019 for removal of left nasal packing 2) please take Keflex antibiotics as prescribed while you have the left nasal packing  3) please note that your amlodipine has been changed to 5 mg daily for better blood pressure control, high blood pressure will make you more likely to get additional nosebleeds 4) please call or return if further concerns about nosebleeds  Admission Diagnosis  Frequent nosebleeds [R04.0]   Discharge Diagnosis  Frequent nosebleeds [R04.0]    Principal Problem:   Frequent nosebleeds Active Problems:   Hypokalemia   Accelerated hypertension      Past Medical History:  Diagnosis Date  . Anemia   . Anxiety   . Depressive disorder, not elsewhere classified   . Family history of breast cancer   . Family history of colon cancer   . Family history of lung cancer   . Hypertension   . Migraine   . Migraines   . Miscarriage 2009  . Neck pain   . Vaginitis    cyctitis    Past Surgical History:  Procedure Laterality Date  . cervical cryotherapy N/A 1999  . DILITATION & CURRETTAGE/HYSTROSCOPY WITH THERMACHOICE ABLATION  08/26/2013   Procedure: DILATATION & CURETTAGE/HYSTEROSCOPY WITH THERMACHOICE ENDOMETRIAL ABLATION Procedure #2 Total Therapy Time=min       sec;  Surgeon: Jonnie Kind, MD;  Location: [facility];  Service: Gynecology;;  . LAPAROSCOPIC BILATERAL SALPINGECTOMY Bilateral 08/26/2013   Procedure: LAPAROSCOPIC BILATERAL SALPINGECTOMY AND REMOVAL OF LEFT PERITUBAL CYST Procedure #1;  Surgeon: Jonnie Kind, MD;  Location: [facility];  Service: Gynecology;  Laterality: Bilateral;  . LAPAROSCOPIC LYSIS OF ADHESIONS  08/26/2013   Procedure: LAPAROSCOPIC LYSIS OF ADHESIONS Procedure #1;  Surgeon: Jonnie Kind, MD;  Location: [facility];  Service: Gynecology;;  . Carmelia Bake LESION REMOVAL  08/26/2013   Procedure: REMOVAL OF VULVAR SEBACEOUS CYST Procedure #3;  Surgeon: Jonnie Kind, MD;  Location: [facility];  Service: Gynecology;;     HPI  from the history and physical done on the day of admission:    Chief Complaint: Epistaxis  HPI: Rebecca Boyd is a 44 y.o. female with medical history significant of benign essential hypertension, anxiety and migraines presented to ED for evaluation of recurrent epistaxis.  Patient states that this is her fourth visit during the last 4 days for same problem.  Patient states that she was seen by the ENT doctor who placed nasal packing in her left nare to prevent the nasal bleeding but she started having blood oozing around the packing in the left nare, mild bleeding from right nare and also bleeding from her throat.  Patient states that  she is feeling dizzy and weak because of nasal bleeding.  Patient is not on any blood thinners and also denies any family history of bleeding disorders.  Patient denies fever, chills, headache, chest pain, nausea, vomiting, abdominal pain and urinary symptoms.  ED Course: The ED patient had temperature of 99.1, blood pressure 152/112, heart rate 128 and respiratory rate of 20.  Blood work showed hemoglobin of 12.1, potassium 3.0.  In ED patient was anxious and tachycardic with high blood pressure and was managed with a bolus of lactated Ringer, 1 dose of IV 5 mg labetalol and also given a dose of Ativan and potassium supplementation.  ED physician also contacted the ENT physician/Dr. Redmond Baseman who suggested to get the patient's blood pressure under control and if the nosebleeds still continues after controlling the blood pressure, he should be officially consulted for  more definitive management of nosebleed.  Review of Systems: As per HPI otherwise 10 point review of systems negative.      Hospital Course:    1) recurrent epistasis the setting of elevated BP--- BP was 190/105 initially  -case was d/w ENT physician Dr. Redmond Baseman --advised no further intervention unless bleeding persist despite BP control,  -- keep left nasal packing in place, -Keflex prophylactically while nasal packing in place -Follow-up with PCP on 07/14/2019 for removal of nasal packing BP recheck  2)HTN--- increase amlodipine to 5 mg daily for better BP control  Discharge Condition: stable  Follow UP  Follow-up Information    Fayrene Helper, MD. Schedule an appointment as soon as possible for a visit.   Specialty: Family Medicine Why: Follow-up on Monday, 07/14/2019 for removal of left nasal packing Contact information: [address] [place] [address] [phone]           Consult- ENT Dr Redmond Baseman  Diet and Activity recommendation:  As advised  Discharge Instructions     Discharge Instructions    Call MD for:  difficulty breathing, headache or visual disturbances   Complete by: As directed    Call MD for:  persistant dizziness or light-headedness   Complete by: As directed    Call MD for:  persistant nausea and vomiting   Complete by: As directed    Call MD for:  severe uncontrolled pain   Complete by: As directed    Call MD for:  temperature >100.4   Complete by: As directed    Diet - low sodium heart healthy   Complete by: As directed    Diet - low sodium heart healthy   Complete by: As directed    Discharge instructions   Complete by: As directed    1) please follow-up with the primary care physician on Monday, 07/14/2019 for removal of left nasal packing 2) please take Keflex antibiotics as prescribed while you have the left nasal packing  3) please note that your amlodipine has been changed to 5 mg daily for better blood pressure  control, high blood pressure will make you more likely to get additional nosebleeds 4) please call or return if further concerns about nosebleeds   Increase activity slowly   Complete by: As directed    Increase activity slowly   Complete by: As directed        Discharge Medications     Allergies as of 07/11/2019      Reactions   Diphenhydramine Hcl Anaphylaxis   (Benadryl)   Orange Fruit Hives, Shortness Of Breath, Itching, Other (See Comments)  MAKES IT DIFFICULT TO BREATH   Latex Rash      Medication List    TAKE these medications   albuterol 108 (90 Base) MCG/ACT inhaler Commonly known as: VENTOLIN HFA Inhale 2 puffs into the lungs every 6 (six) hours as needed for wheezing or shortness of breath.   ALPRAZolam 1 MG tablet Commonly known as: XANAX Take 1 tablet (1 mg total) by mouth at bedtime.   amLODipine 5 MG tablet Commonly known as: NORVASC Take 1 tablet (5 mg total) by mouth daily. What changed:   medication strength  how much to take   cephALEXin 500 MG capsule Commonly known as: Keflex Take 1 capsule (500 mg total) by mouth 4 (four) times daily for 7 days.   cetirizine 10 MG tablet Commonly known as: ZyrTEC Allergy Take 1 tablet (10 mg total) by mouth daily.   FLUoxetine 20 MG tablet Commonly known as: PROZAC Take 1 tablet (20 mg total) by mouth daily.   oxyCODONE 5 MG immediate release tablet Commonly known as: Roxicodone Take one tablet once daily for back pain What changed:   how much to take  how to take this  when to take this  additional instructions   oxyCODONE 5 MG immediate release tablet Commonly known as: Roxicodone Take one tablet by mouth once daily for back pain Start taking on: July 19, 2019 What changed: Another medication with the same name was changed. Make sure you understand how and when to take each.   SUMAtriptan 6 MG/0.5ML Soln injection Commonly known as: Imitrex Use one injection at onset of migraine.  May  repeat in 2 hrs, if needed.  Max dose: 2 inj/day. This is a 30 day prescription.   topiramate 100 MG tablet Commonly known as: TOPAMAX Take 100 mg by mouth 2 (two) times daily.       Major procedures and Radiology Reports - PLEASE review detailed and final reports for all details, in brief -   No results found.  Micro Results   No results found for this or any previous visit (from the past 240 hour(s)).  Today   Subjective    Rebecca Boyd today has no new complaints  No Nausea, Vomiting or Diarrhea No fever  Or chills  -No chest pain, no palpitations, no dizziness, no shortness of breath          Patient has been seen and examined prior to discharge   Objective   Blood pressure (!) 138/98, pulse 99, temperature 99.1 F (37.3 C), temperature source Oral, resp. rate (!) 22, height 5\' 1"  (1.549 m), weight 77.1 kg, last menstrual period 06/25/2019, SpO2 100 %.  No intake or output data in the 24 hours ending 07/11/19 1140  Exam Gen:- Awake Alert, no acute distress  HEENT:- [place].AT, No sclera icterus Nose- Lt Nare with blood soaked nasal packing, appears hemostatic at this time Neck-Supple Neck,No JVD,.  Lungs-  CTAB , good air movement bilaterally  CV- S1, S2 normal, regular Abd-  +ve B.Sounds, Abd Soft, No tenderness,    Extremity/Skin:- No  edema,   good pulses Psych-affect is appropriate, oriented x3 Neuro-no new focal deficits, no tremors    Data Review   CBC w Diff:  Lab Results  Component Value Date   WBC 7.5 07/11/2019   HGB 11.1 (L) 07/11/2019   HCT 33.2 (L) 07/11/2019   PLT 172 07/11/2019   LYMPHOPCT 17 07/11/2019   MONOPCT 6 07/11/2019   EOSPCT 1 07/11/2019   BASOPCT  0 07/11/2019    CMP:  Lab Results  Component Value Date   NA 139 07/11/2019   K 3.5 07/11/2019   CL 108 07/11/2019   CO2 24 07/11/2019   BUN 12 07/11/2019   CREATININE 0.62 07/11/2019   CREATININE 0.75 06/10/2018   PROT 6.7 07/11/2019   ALBUMIN 3.6 07/11/2019   BILITOT  0.8 07/11/2019   ALKPHOS 67 07/11/2019   AST 15 07/11/2019   ALT 21 07/11/2019  . Total Discharge time is about 33 minutes  Roxan Hockey M.D on 07/11/2019 at 11:40 AM  Go to [email or web address] -  for contact info  [facility] - Office  [phone]

## 2019-07-11 NOTE — ED Provider Notes (Signed)
Patient transferred from [facility] for emergent ENT evaluation of recurrent nosebleeds.  She has been seen several times in the last few days for this with packing placed but has blood from around the packing.  Appears to have a posterior bleed.  Has also been seen by ENT outpatient as well.  She had an overnight observation admission at [facility] and was discharged but with recurrent bleeding from around packing the decision was made to transfer her to [facility] emergency department for ENT evaluation.  Dr. Janace Hoard has been paged and is aware of patient.  Presently the patient states that she feels a little "woozy" but otherwise has no complaints.  No further bleeding from behind the eye or back of the throat. Physical Exam  BP (!) 154/104   Pulse 76   Temp 98.1 F (36.7 C) (Oral)   Resp 16   Ht 5' (1.524 m)   Wt 74.8 kg   LMP 06/25/2019   SpO2 96%   BMI 32.22 kg/m   Physical Exam Vitals and nursing note reviewed.  Constitutional:      General: She is not in acute distress.    Appearance: She is well-developed.     Comments: Resting comfortably in chair  HENT:     Head: Normocephalic and atraumatic.     Nose:     Comments: Nasal packing to the left nare, small amount of blood oozing from around this.    Mouth/Throat:     Comments: No blood in the posterior oropharynx Eyes:     General:        Right eye: No discharge.        Left eye: No discharge.     Conjunctiva/sclera: Conjunctivae normal.  Neck:     Vascular: No JVD.     Trachea: No tracheal deviation.  Cardiovascular:     Rate and Rhythm: Normal rate.  Pulmonary:     Effort: Pulmonary effort is normal.  Abdominal:     General: There is no distension.  Musculoskeletal:     Cervical back: Neck supple.  Skin:    General: Skin is warm and dry.     Findings: No erythema.  Neurological:     Mental Status: She is alert.  Psychiatric:        Behavior: Behavior normal.     ED Course/Procedures      Procedures  MDM  Patient sent from [facility] for emergent ENT evaluation.  Dr. Janace Hoard has been notified.  The patient is initially tachycardic, otherwise hemodynamically stable and resting comfortably in no apparent distress.  She has a small amount of blood oozing from around her Merocel packing but no significant hemorrhage.  Her vital signs improved on reevaluation.  Dr. Janace Hoard has seen and evaluated the patient emergently in the ED.  They had a discussion regarding options.  The patient elected to keep the packing in for a few more days and will follow up if she has any further bleeding on an outpatient basis.  Indications for return to the ED were discussed and patient is hemodynamically stable and in no distress upon discharge.       Renita Papa, PA-C 07/12/19 [id number]    Margette Fast, MD 07/12/19 1006

## 2019-07-17 ENCOUNTER — Other Ambulatory Visit: Payer: Self-pay

## 2019-07-17 ENCOUNTER — Ambulatory Visit: Payer: Self-pay | Admitting: Family Medicine

## 2019-07-22 ENCOUNTER — Other Ambulatory Visit (HOSPITAL_COMMUNITY)
Admission: RE | Admit: 2019-07-22 | Discharge: 2019-07-22 | Disposition: A | Discharge status: Home / Self Care | Payer: Self-pay | Source: Ambulatory Visit | Attending: Family Medicine | Admitting: Family Medicine

## 2019-07-22 ENCOUNTER — Encounter: Payer: Self-pay | Admitting: Family Medicine

## 2019-07-22 ENCOUNTER — Ambulatory Visit (INDEPENDENT_AMBULATORY_CARE_PROVIDER_SITE_OTHER): Payer: Self-pay | Admitting: Family Medicine

## 2019-07-22 ENCOUNTER — Other Ambulatory Visit: Payer: Self-pay

## 2019-07-22 ENCOUNTER — Other Ambulatory Visit (HOSPITAL_COMMUNITY)
Admission: RE | Admit: 2019-07-22 | Discharge: 2019-07-22 | Disposition: A | Discharge status: Home / Self Care | Payer: Self-pay | Source: Other Acute Inpatient Hospital | Attending: Family Medicine | Admitting: Family Medicine

## 2019-07-22 VITALS — BP 124/78 | HR 74 | Temp 97.1°F | Resp 15 | Ht 60.0 in | Wt 173.0 lb

## 2019-07-22 DIAGNOSIS — I10 Essential (primary) hypertension: Secondary | ICD-10-CM

## 2019-07-22 DIAGNOSIS — B9689 Other specified bacterial agents as the cause of diseases classified elsewhere: Secondary | ICD-10-CM | POA: Insufficient documentation

## 2019-07-22 DIAGNOSIS — N760 Acute vaginitis: Secondary | ICD-10-CM

## 2019-07-22 DIAGNOSIS — Z09 Encounter for follow-up examination after completed treatment for conditions other than malignant neoplasm: Secondary | ICD-10-CM

## 2019-07-22 DIAGNOSIS — Z0289 Encounter for other administrative examinations: Secondary | ICD-10-CM | POA: Insufficient documentation

## 2019-07-22 MED ORDER — UNABLE TO FIND: 1.0000 | 1 refills | Status: DC

## 2019-07-22 MED ORDER — AMLODIPINE BESYLATE 10 MG PO TABS: 10.0000 mg | ORAL_TABLET | Freq: Every day | ORAL | 1 refills | Status: DC

## 2019-07-22 NOTE — Assessment & Plan Note (Signed)
Blood pressure has demonstrated intermittent erratic control over the last several weeks.  She was started on Norvasc 5 mg I have increased that to 10 mg today.  We will follow-up in 3 weeks.  Advised for her to contact us if she develops a nosebleed.  If she develops a significant nosebleed she is advised to go to the nearest emergency room.  Reviewed side effects, risks and benefits of medication.  Patient acknowledged agreement and understanding of the plan.

## 2019-07-22 NOTE — Progress Notes (Signed)
Subjective:  Patient ID: Rebecca Boyd, female    DOB: 08/09/1975  Age: 44 y.o. MRN: [id number]  CC:  Chief Complaint  Patient presents with  . ER follow up    has stopped since she was released from the ER. Had been bleeding for 8 days   . Vaginitis    Itching and, little discharge and odor. Gets frequent BV. Uses metrogel       HPI  HPI Rebecca Boyd is a 44 y.o. female with history of anxiety, depression, migraines, neck pain, vaginitis among others.  Has had multiple emergency room visits over the beginning part of March 2021.  Most recent was on March 12.  She presented after having recurrent nosebleeds.  Has been seen in the office twice by ENT.  She showed up to the emergency room on March 11 with bleeding from her left eye, posterior throat, some mild bleeding out of her right nare and then oozing from the packed side which was the left nare.  She felt weak and lightheaded compared to previous nosebleeds several days before.  There were no other associated or modified symptoms noted at that time.  She was sent to come in for an emergent ENT evaluation at that time.  Dr. Janace Hoard was notified and the patient initially presented tachycardic otherwise hemodynamically stable.  No apparent distress.  She was still oozing a small amount of blood around her Merocel packing but no significant hemorrhage was noted.  Vital signs improved after reevaluation and medication.  Patient elected to keep the packing in for a few more days and follow-up on outpatient basis.  Unless she had additional bleeding.  Return precautions were provided.  She was started on amlodipine 5 mg.  Advised to follow-up with her PCP.  Today she reports that she takes her blood pressure at home in a range of 130-140/80  Highest she has seen diastolic number is [id number]. Does eat salt products and containing foods.  No dramatic weight shifts or changes in the last year that she is aware of.  Usually fluctuates between  5 and 8 pounds. She reports that her last nosebleed was 3 days ago over the weekend on 20 March.  Reports that she has been taking the blood pressure medicine and is tolerating it well.  Is willing to have an adjustment of the medication today.  Denies having any chest pain, leg swelling, shortness of breath, cough, headaches, dizziness, vision changes or any other signs or symptoms of uncontrolled blood pressure at this time.  Does not have any nosebleed today in the office.  Today patient denies signs and symptoms of COVID 19 infection including fever, chills, cough, shortness of breath, and headache. Past Medical, Surgical, Social History, Allergies, and Medications have been Reviewed.   Past Medical History:  Diagnosis Date  . Anemia   . Anxiety   . Depressive disorder, not elsewhere classified   . Family history of breast cancer   . Family history of colon cancer   . Family history of lung cancer   . Hypertension   . Migraine   . Migraines   . Miscarriage 2009  . Neck pain   . Vaginitis    cyctitis    No outpatient medications have been marked as taking for the 07/22/19 encounter (Office Visit) with Perlie Mayo, NP.    ROS:  Review of Systems  Constitutional: Negative.   HENT: Negative.   Eyes: Negative.  Respiratory: Negative.   Cardiovascular: Negative.   Gastrointestinal: Negative.   Genitourinary: Negative.   Musculoskeletal: Negative.   Skin: Negative.   Neurological: Negative.   Endo/Heme/Allergies: Negative.   Psychiatric/Behavioral: Negative.   All other systems reviewed and are negative.    Objective:   Today's Vitals: BP 124/78   Pulse 74   Temp (!) 97.1 F (36.2 C) (Temporal)   Resp 15   Ht 5' (1.524 m)   Wt 173 lb (78.5 kg)   LMP 06/25/2019   SpO2 99%   BMI 33.79 kg/m  Vitals with BMI 07/22/2019 07/11/2019 07/11/2019  Height 5\' 0"  - 5\' 0"   Weight 173 lbs - 165 lbs  BMI [id number] - [id number]  Systolic [id number] 123456 -  Diastolic 78 123456 -  Pulse 74 76  -  Some encounter information is confidential and restricted. Go to Review Flowsheets activity to see all data.     Physical Exam Vitals and nursing note reviewed.  Constitutional:      Appearance: Normal appearance. She is well-developed and well-groomed. She is obese.  HENT:     Head: Normocephalic and atraumatic.     Right Ear: External ear normal.     Left Ear: External ear normal.     Nose: Nose normal.     Mouth/Throat:     Mouth: Mucous membranes are moist.     Pharynx: Oropharynx is clear.  Eyes:     General:        Right eye: No discharge.        Left eye: No discharge.     Conjunctiva/sclera: Conjunctivae normal.  Cardiovascular:     Rate and Rhythm: Normal rate and regular rhythm.     Pulses: Normal pulses.     Heart sounds: Normal heart sounds.  Pulmonary:     Effort: Pulmonary effort is normal.     Breath sounds: Normal breath sounds.  Musculoskeletal:        General: Normal range of motion.     Cervical back: Normal range of motion and neck supple.  Skin:    General: Skin is warm.  Neurological:     General: No focal deficit present.     Mental Status: She is alert and oriented to person, place, and time.  Psychiatric:        Attention and Perception: Attention normal.        Mood and Affect: Mood normal.        Speech: Speech normal.        Behavior: Behavior normal. Behavior is cooperative.        Thought Content: Thought content normal.        Cognition and Memory: Cognition normal.        Judgment: Judgment normal.     Assessment   1. Encounter for examination following treatment at hospital   2. Essential hypertension, benign   3. Bacterial vaginosis   4. Pain management contract agreement     Tests ordered Orders Placed This Encounter  Procedures  . ToxASSURE Select 13 (MW), Urine     Plan: Please see assessment and plan per problem list above.   Meds ordered this encounter  Medications  . amLODipine (NORVASC) 10 MG tablet     Sig: Take 1 tablet (10 mg total) by mouth daily.    Dispense:  30 tablet    Refill:  1    Dose Change, put on profile until needed    Order Specific Question:   Supervising  Provider    Answer:   Fayrene Helper [id number]  . UNABLE TO FIND    Sig: Place 1 suppository vaginally 3 (three) times a week.    Dispense:  15 suppository    Refill:  1    Boric Acid Supp    Order Specific Question:   Supervising Provider    Answer:   Fayrene Helper [id number]    Patient to follow-up in 3 weeks.  Perlie Mayo, NP

## 2019-07-22 NOTE — Patient Instructions (Addendum)
I appreciate the opportunity to provide you with care for your health and wellness. Today we discussed: BP and recent ER   Follow up: 3 weeks   No labs or referrals today  Work note today -out until next appt   Start taking Norvasc 10 mg daily. Continue to check BP at home. GOAL is 120-130/60-80  Use suppository as directed   Please continue to practice social distancing to keep you, your family, and our community safe.  If you must go out, please wear a mask and practice good handwashing.  It was a pleasure to see you and I look forward to continuing to work together on your health and well-being. Please do not hesitate to call the office if you need care or have questions about your care.  Have a wonderful day and week. With Gratitude, Cherly Beach, DNP, AGNP-BC

## 2019-07-22 NOTE — Assessment & Plan Note (Signed)
Updated tox assure screen ordered today.  Pain management contract signed today.  Will refill medications based on Dr. Griffin Dakin recommendations as previously prescribed once tox assure is resulted.  PMP aware will also be consulted prior to prescription

## 2019-07-22 NOTE — Assessment & Plan Note (Signed)
Review of hospital notes, test, lab results and recommendations.  Please see note for treatment changes and ongoing treatment plan.

## 2019-07-22 NOTE — Assessment & Plan Note (Signed)
Will get urine screening today to check for bacterial vaginosis.  Advised for her to start boric acid suppositories.  Will provide treatment for BV if this is positive.

## 2019-07-26 LAB — MISC LABCORP TEST (SEND OUT): Labcorp test code: 738526

## 2019-07-27 ENCOUNTER — Telehealth: Payer: Self-pay | Admitting: Family Medicine

## 2019-07-27 ENCOUNTER — Other Ambulatory Visit: Payer: Self-pay | Admitting: Family Medicine

## 2019-07-27 NOTE — Telephone Encounter (Signed)
Please call patient and let her know that based on her recent urine test, she will need pain mangement through a pain clinic , also her anxiety  ( xanax) will need to be managed through Psychiatry She is already a pt of Dr Harrington Challenger, so she can discuss treating bedtime anxiety and poor sleep with her, for chronic pain management , Dr Merlene Laughter is local, so I recommend him, if she agrees please let me know. You may explain that I am happy to continue to provide her other health care, but since medications that I have been prescribing and she has been filling for pain and anxiety are not showing up in her urine, when screened, this is a breech of the pain contract , so she will need those problems addressed by Specialist clinics . Please notify pharmacy to discontinue any refills on xanax  Also Please contact me with concerns/ questions, message preferred

## 2019-07-28 LAB — URINE CYTOLOGY ANCILLARY ONLY
Bacterial Vaginitis-Urine: POSITIVE — AB
Bacterial Vaginitis-Urine: POSITIVE — AB
Candida Urine: NEGATIVE
Comment: NEGATIVE
Trichomonas: NEGATIVE

## 2019-07-29 ENCOUNTER — Telehealth: Payer: Self-pay

## 2019-07-29 ENCOUNTER — Other Ambulatory Visit: Payer: Self-pay

## 2019-07-29 DIAGNOSIS — M545 Low back pain, unspecified: Secondary | ICD-10-CM

## 2019-07-29 DIAGNOSIS — N760 Acute vaginitis: Secondary | ICD-10-CM

## 2019-07-29 DIAGNOSIS — B9689 Other specified bacterial agents as the cause of diseases classified elsewhere: Secondary | ICD-10-CM

## 2019-07-29 MED ORDER — UNABLE TO FIND: 1.0000 | 1 refills | Status: DC

## 2019-07-29 NOTE — Telephone Encounter (Signed)
-----   Message from Fayrene Helper, MD sent at 07/27/2019  5:08 PM EDT ----- Please see telel message, pot willl need pain clinic, also please contact pharmacy to discontinue alprazolam, she had a script recently sent with 5 refills, I have removed it but pharmacy needs to be notified as she is non compliant,I have explained all in the tele message, thanks. ?? Please ask

## 2019-07-29 NOTE — Progress Notes (Signed)
Pt aware and will get med called in

## 2019-07-29 NOTE — Telephone Encounter (Signed)
Pt aware and xanax refills discontinued

## 2019-07-30 ENCOUNTER — Ambulatory Visit: Payer: Self-pay | Admitting: Family Medicine

## 2019-08-01 ENCOUNTER — Other Ambulatory Visit: Payer: Self-pay | Admitting: Family Medicine

## 2019-08-01 ENCOUNTER — Telehealth: Payer: Self-pay

## 2019-08-06 ENCOUNTER — Telehealth: Payer: Self-pay | Admitting: *Deleted

## 2019-08-06 NOTE — Telephone Encounter (Signed)
Pt is calling wanting to know why her xanax cannot be filled here. She went to Dr Merlene Laughter and he cannot write for xanax. Would like a call back.

## 2019-08-07 ENCOUNTER — Other Ambulatory Visit: Payer: Self-pay

## 2019-08-07 DIAGNOSIS — F419 Anxiety disorder, unspecified: Secondary | ICD-10-CM

## 2019-08-07 NOTE — Telephone Encounter (Signed)
Referral sent 

## 2019-08-07 NOTE — Telephone Encounter (Signed)
Pt returning your call. She states Merlene Laughter is out of the office, and she has a neurologist appt on Tues, and she will try to make an appt with Harrington Challenger

## 2019-08-07 NOTE — Telephone Encounter (Signed)
Left voicemail that the pain med would have to come from [facility] but the xanax was discontinued by Dr Moshe Cipro because it did not show up in the drug screen. She will need appt with Dr Harrington Challenger who she has seen in the past (psychiatry) to prescribe this. Voicemail left to this effect

## 2019-08-07 NOTE — Telephone Encounter (Signed)
Pt called back states she needs a refill to [facility]

## 2019-08-12 ENCOUNTER — Ambulatory Visit (INDEPENDENT_AMBULATORY_CARE_PROVIDER_SITE_OTHER): Payer: Self-pay | Admitting: Family Medicine

## 2019-08-12 ENCOUNTER — Other Ambulatory Visit: Payer: Self-pay

## 2019-08-12 ENCOUNTER — Encounter: Payer: Self-pay | Admitting: Family Medicine

## 2019-08-12 ENCOUNTER — Ambulatory Visit (INDEPENDENT_AMBULATORY_CARE_PROVIDER_SITE_OTHER): Payer: Self-pay | Admitting: Neurology

## 2019-08-12 ENCOUNTER — Encounter: Payer: Self-pay | Admitting: Neurology

## 2019-08-12 ENCOUNTER — Ambulatory Visit: Payer: Self-pay | Admitting: Family Medicine

## 2019-08-12 VITALS — BP 138/82 | HR 64 | Temp 97.8°F | Ht 60.0 in | Wt 174.0 lb

## 2019-08-12 VITALS — BP 144/82 | HR 78 | Temp 97.2°F | Resp 16 | Ht 60.0 in | Wt 174.0 lb

## 2019-08-12 DIAGNOSIS — G43709 Chronic migraine without aura, not intractable, without status migrainosus: Secondary | ICD-10-CM

## 2019-08-12 DIAGNOSIS — IMO0002 Reserved for concepts with insufficient information to code with codable children: Secondary | ICD-10-CM

## 2019-08-12 DIAGNOSIS — I10 Essential (primary) hypertension: Secondary | ICD-10-CM

## 2019-08-12 DIAGNOSIS — F323 Major depressive disorder, single episode, severe with psychotic features: Secondary | ICD-10-CM

## 2019-08-12 DIAGNOSIS — Z0289 Encounter for other administrative examinations: Secondary | ICD-10-CM

## 2019-08-12 MED ORDER — SUMATRIPTAN SUCCINATE 6 MG/0.5ML ~~LOC~~ SOLN: SUBCUTANEOUS | 11 refills | Status: AC

## 2019-08-12 MED ORDER — TOPIRAMATE 100 MG PO TABS: 100.0000 mg | ORAL_TABLET | Freq: Two times a day (BID) | ORAL | 4 refills | Status: DC

## 2019-08-12 MED ORDER — AIMOVIG 70 MG/ML ~~LOC~~ SOAJ: 70.0000 mg | SUBCUTANEOUS | 11 refills | Status: AC

## 2019-08-12 MED ORDER — HYDROCHLOROTHIAZIDE 12.5 MG PO TABS: 12.5000 mg | ORAL_TABLET | Freq: Every day | ORAL | 1 refills | Status: DC

## 2019-08-12 NOTE — Assessment & Plan Note (Signed)
Starting Hydrodiuril 12.5 mg Norvasc at 10 mg tolerating it well. Does not want to go back to work until BP is better control on reg basis. Work note provided.

## 2019-08-12 NOTE — Assessment & Plan Note (Addendum)
Was referred to pain clinic due to her pain and anxiety medications not showing up in her urine she was in breech of her pain contract and therefore was discharged for that care to the specialist clinics. Today she reports that the pain/neurologist clinic dr is not there for a few weeks and the Dr Harrington Challenger is no longer seeing adults. She is asking for refills of her medications.  I have explained to her that the meds did not show up in her urine. She reports she does not know why. Denies any pain today in the office when asked. Our office personally called Dr Freddie Apley office and they verified that he is not on vacation.  They will work to get an appt for her. As for the xanax, she is recommended to another [facility] provider.

## 2019-08-12 NOTE — Progress Notes (Signed)
Subjective:  Patient ID: Rebecca Boyd, female    DOB: [birth year]  Age: 44 y.o. MRN: [id number]  CC:  Chief Complaint  Patient presents with  . Hypertension    follow up visit for BP       HPI  HPI Rebecca Boyd is a 44 year old female patient who presents today for follow-up of blood pressure.  I have been keeping her out of work to help get her blood pressure under control as the type of work she has she is exposed to high pressure heat and wanted to make sure that her blood pressure was better before going back to work secondary to having acute nosebleeds for several weeks. She is feeling better and BP is showing some better control.   Additionally she had a urine drug screen for her pain management follow-up.  She did not have   medications that she has been prescribed in her system. She reports she does not know why or what the hospital did to make them not be there. Dr Moshe Cipro requested she be referred to Dr Harrington Challenger and Dr Merlene Laughter.  Today she reports back that Dr Merlene Laughter is out of the office for 2-3 weeks and Dr Harrington Challenger will not see her anymore, is only seeing children. She reports needing refills of these meds due to the above.  Today patient denies signs and symptoms of COVID 19 infection including fever, chills, cough, shortness of breath, and headache. Past Medical, Surgical, Social History, Allergies, and Medications have been Reviewed.   Past Medical History:  Diagnosis Date  . Anemia   . Anxiety   . Cervical neck pain with evidence of disc disease 10/02/2013  . Depressive disorder, not elsewhere classified   . Family history of breast cancer   . Family history of breast cancer in sister 09/25/2018  . Family history of colon cancer   . Family history of lung cancer   . FH: breast cancer in relative when <10 years old 06/10/2018   Dx at age 62  . 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 Hypertension   . Migraine   . Migraines   . Miscarriage 2009  .  Neck pain   . Nondisplaced fracture of fifth metatarsal bone, left foot, subsequent encounter for fracture with routine healing 08/15/18 09/25/2018  . Pruritus 09/29/2018  . Pruritus ani 09/25/2018  . Rash and nonspecific skin eruption 05/14/2017  . Vaginitis    cyctitis    Current Meds  Medication Sig  . albuterol (VENTOLIN HFA) 108 (90 Base) MCG/ACT inhaler Inhale 2 puffs into the lungs every 6 (six) hours as needed for wheezing or shortness of breath.  Marland Kitchen amLODipine (NORVASC) 10 MG tablet Take 1 tablet (10 mg total) by mouth daily.  . cetirizine (ZYRTEC ALLERGY) 10 MG tablet Take 1 tablet (10 mg total) by mouth daily.  Eduard Roux (AIMOVIG) 70 MG/ML SOAJ Inject 70 mg into the skin every 30 (thirty) days.  Marland Kitchen FLUoxetine (PROZAC) 20 MG tablet Take 1 tablet (20 mg total) by mouth daily.  . SUMAtriptan (IMITREX) 6 MG/0.5ML SOLN injection Use one injection at onset of migraine.  May repeat in 2 hrs, if needed.  Max dose: 2 inj/day. This is a 30 day prescription.  . topiramate (TOPAMAX) 100 MG tablet Take 1 tablet (100 mg total) by mouth 2 (two) times daily.  Marland Kitchen UNABLE TO FIND Place 1 suppository vaginally 3 (three) times a week.    ROS:  Review of Systems  Constitutional: Negative.   HENT: Negative.   Eyes: Negative.   Respiratory: Negative.   Cardiovascular: Negative.   Gastrointestinal: Negative.   Genitourinary: Negative.   Musculoskeletal: Negative.   Skin: Negative.   Neurological: Negative.   Endo/Heme/Allergies: Negative.   Psychiatric/Behavioral: Negative.   All other systems reviewed and are negative.    Objective:   Today's Vitals: BP (!) 144/82   Pulse 78   Temp (!) 97.2 F (36.2 C) (Temporal)   Resp 16   Ht 5' (1.524 m)   Wt 174 lb (78.9 kg)   SpO2 97%   BMI 33.98 kg/m  Vitals with BMI 08/12/2019 08/12/2019 07/22/2019  Height 5\' 0"  5\' 0"  5\' 0"   Weight 174 lbs 174 lbs 173 lbs  BMI 33.98 [id number]  Systolic 123456 0000000 [id number]  Diastolic 82 82 78  Pulse 78 64 74   Some encounter information is confidential and restricted. Go to Review Flowsheets activity to see all data.     Physical Exam Vitals and nursing note reviewed.  Constitutional:      Appearance: Normal appearance. She is well-developed and well-groomed. She is obese.  HENT:     Head: Normocephalic and atraumatic.     Right Ear: External ear normal.     Left Ear: External ear normal.     Mouth/Throat:     Comments: Mask in place  Eyes:     General:        Right eye: No discharge.        Left eye: No discharge.     Conjunctiva/sclera: Conjunctivae normal.  Cardiovascular:     Rate and Rhythm: Normal rate and regular rhythm.     Pulses: Normal pulses.     Heart sounds: Normal heart sounds.  Pulmonary:     Effort: Pulmonary effort is normal.     Breath sounds: Normal breath sounds.  Musculoskeletal:        General: Normal range of motion.     Cervical back: Normal range of motion and neck supple.  Skin:    General: Skin is warm.  Neurological:     General: No focal deficit present.     Mental Status: She is alert and oriented to person, place, and time.  Psychiatric:        Attention and Perception: Attention normal.        Mood and Affect: Mood normal.        Speech: Speech normal.        Behavior: Behavior normal. Behavior is cooperative.        Thought Content: Thought content normal.        Cognition and Memory: Cognition normal.        Judgment: Judgment normal.     Assessment   1. Essential hypertension, benign   2. Pain management contract agreement   3. 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 Ambulatory referral to [facility]: Please see assessment and plan per problem list above.   Meds ordered this encounter  Medications  . hydrochlorothiazide (HYDRODIURIL) 12.5 MG tablet    Sig: Take 1 tablet (12.5 mg total) by mouth daily.    Dispense:  30 tablet    Refill:   1    Order Specific Question:   Supervising Provider    Answer:   Fayrene Helper [id number]    Patient to follow-up  in 09/02/2019  Perlie Mayo, NP

## 2019-08-12 NOTE — Assessment & Plan Note (Signed)
Referral to [facility] for follow up.

## 2019-08-12 NOTE — Patient Instructions (Signed)
Continue current medication, glad your headaches are better :) See you back in 1 year or sooner ifneeded

## 2019-08-12 NOTE — Patient Instructions (Signed)
I appreciate the opportunity to provide you with care for your health and wellness. Today we discussed: blood pressure  Follow up: 2-3 weeks by phone/mychart  No labs or referrals today  Adding a new medication for BP control. It is a fluid pill and will make you pee more.  Also work note for until the next appt date.  Please continue to practice social distancing to keep you, your family, and our community safe.  If you must go out, please wear a mask and practice good handwashing.  It was a pleasure to see you and I look forward to continuing to work together on your health and well-being. Please do not hesitate to call the office if you need care or have questions about your care.  Have a wonderful day and week. With Gratitude, Cherly Beach, DNP, AGNP-BC

## 2019-08-12 NOTE — Progress Notes (Signed)
PATIENT: Rebecca Boyd DOB: 1976/02/07  REASON FOR VISIT: follow up HISTORY FROM: patient  HISTORY OF PRESENT ILLNESS: Today 08/12/19  HISTORY HISTORY OF PRESENT ILLNESS: Rebecca Boyd 44 years old right-handed female, seen in refer by her primary care doctor Rebecca Boyd, for evaluation of headache, initial evaluation was December 06 2016.  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  She reported a history of migraine headaches since 2007, used to be intermittent, couple times each months, but since June 2018, she reported increased headache, to almost daily bases, constant low-grade lateralized headache, frequent exacerbation to severe pounding headache with associated light noise sensitivity, nauseous, sometimes associated seeing flashlight prior to and during her severe migraine headache, her headache can last for couple days,  She was started on Topamax 100 mg twice a day about 2 months ago, with mild side effect, but no significant help, she also takes Imitrex 50 mg as needed, is helpful most of the time,  Trigger for her migraine headaches are weather change, bright light, strong smells,  UPDATE Sept 6 2018: She has tried imitrex 50mg  as needed few times over past one month, she throw up each time. Today, she complains of 2 weeks of severe left parietal area sharp headaches,she has tried imitrex, phenergan did not help.   UPDATE Feb 4th 2019:  Her migraine has much improved, now she has it about twice a month, lasting for 2 days, she throw up maxalt dissolvablesometimes becauseofsevere nausea duringmigraine headaches,  UPDATE 8/5/2019CMMs. Rebecca Boyd, 44 year old female returns for follow-up with history of migraine headaches. She is doing much better with her migraines has maybe 2/ month. She is currently on nortriptyline 20 mg at bedtime Topamax 100  twice a day, Imitrex and Maxalt acutely as needed. She is not aware of any specific foods that cause problems. She returns for reevaluation  Virtual visit July 31 2018: She still has frequent migraine headaches, in one month, she can have headache days up to 10 days in a month, she often has to use imitrex injection twicein 1 dayfor her migraine. Sometimes, she comes to office for infusions.  Update 02/11/2019 SS: She was in the office 12/05/2018 for infusion Depacon, Compazine, Toradol. At her PCP 02/05/2019 given toradol and depo medrol IM injection. She is prescribed oxycodone for chronic back pain from PCP. She remain on Aimovig 70 mg monthly injection, Topamax 100 mg BID, and Zanaflex, Imitrex injection as needed. Before the Aimovig injection, she was having 4 bad migraines, now having 2 migraines a month. She is out of Zanaflex. The imitrex injection will improve the headache, not always relieve it. Otherwise health is doing good. She is out of work currently due to the coronavirus pandemic. She is a Glass blower/designer.  Her headaches have improved 50% with Aimovig.  She is tolerating medications well without side effect.   Update August 12, 2019 SS: She is currently taking Aimovig 70 mg, Topamax 100 mg twice a day, and Imitrex injection as needed.  Since last seen, headaches have been quite well, only 2 migraines.  Is tolerating medications well.  She is on unemployment, her job was discontinued.  She is taking Xanax for anxiety, oxycodone for chronic back pain, says PCP will not refill.  REVIEW OF SYSTEMS: Out of a complete 14 system review of symptoms, the patient complains only of the following symptoms, and all other reviewed  systems are negative.  Headache  ALLERGIES: Allergies  Allergen Reactions  . Diphenhydramine Hcl Anaphylaxis    (Benadryl)  . Orange Fruit Hives, Shortness Of Breath, Itching and Other (See Comments)    MAKES IT DIFFICULT TO BREATH  . Latex Rash    HOME  MEDICATIONS: Outpatient Medications Prior to Visit  Medication Sig Dispense Refill  . albuterol (VENTOLIN HFA) 108 (90 Base) MCG/ACT inhaler Inhale 2 puffs into the lungs every 6 (six) hours as needed for wheezing or shortness of breath. 18 g 0  . amLODipine (NORVASC) 10 MG tablet Take 1 tablet (10 mg total) by mouth daily. 30 tablet 1  . cetirizine (ZYRTEC ALLERGY) 10 MG tablet Take 1 tablet (10 mg total) by mouth daily. 30 tablet 1  . FLUoxetine (PROZAC) 20 MG tablet Take 1 tablet (20 mg total) by mouth daily. 30 tablet 5  . oxyCODONE (ROXICODONE) 5 MG immediate release tablet Take one tablet by mouth once daily for back pain (Patient not taking: Reported on 07/08/2019) 30 tablet 0  . SUMAtriptan (IMITREX) 6 MG/0.5ML SOLN injection Use one injection at onset of migraine.  May repeat in 2 hrs, if needed.  Max dose: 2 inj/day. This is a 30 day prescription. 10 vial 11  . UNABLE TO FIND Place 1 suppository vaginally 3 (three) times a week. 15 suppository 1  . topiramate (TOPAMAX) 100 MG tablet Take 100 mg by mouth 2 (two) times daily.     No facility-administered medications prior to visit.    PAST MEDICAL HISTORY: Past Medical History:  Diagnosis Date  . Anemia   . Anxiety   . Depressive disorder, not elsewhere classified   . Family history of breast cancer   . Family history of colon cancer   . Family history of lung cancer   . Hypertension   . Migraine   . Migraines   . Miscarriage 2009  . Neck pain   . Vaginitis    cyctitis    PAST SURGICAL HISTORY: Past Surgical History:  Procedure Laterality Date  . cervical cryotherapy N/A 1999  . DILITATION & CURRETTAGE/HYSTROSCOPY WITH THERMACHOICE ABLATION  08/26/2013   Procedure: DILATATION & CURETTAGE/HYSTEROSCOPY WITH THERMACHOICE ENDOMETRIAL ABLATION Procedure #2 Total Therapy Time=min       sec;  Surgeon: Jonnie Kind, MD;  Location: [facility];  Service: Gynecology;;  . LAPAROSCOPIC BILATERAL SALPINGECTOMY Bilateral 08/26/2013    Procedure: LAPAROSCOPIC BILATERAL SALPINGECTOMY AND REMOVAL OF LEFT PERITUBAL CYST Procedure #1;  Surgeon: Jonnie Kind, MD;  Location: [facility];  Service: Gynecology;  Laterality: Bilateral;  . LAPAROSCOPIC LYSIS OF ADHESIONS  08/26/2013   Procedure: LAPAROSCOPIC LYSIS OF ADHESIONS Procedure #1;  Surgeon: Jonnie Kind, MD;  Location: [facility];  Service: Gynecology;;  . Carmelia Bake LESION REMOVAL  08/26/2013   Procedure: REMOVAL OF VULVAR SEBACEOUS CYST Procedure #3;  Surgeon: Jonnie Kind, MD;  Location: [facility];  Service: Gynecology;;    FAMILY HISTORY: Family History  Problem Relation Age of Onset  . Drug abuse Mother   . Hypertension Father   . Breast cancer Sister        dx 67  . Hypertension Maternal Grandmother   . Hypertension Maternal Grandfather   . Hypertension Paternal Grandmother   . Breast cancer Paternal Grandmother        dx late 80s  . Colon cancer Cousin 42    SOCIAL HISTORY: Social History   Socioeconomic History  . Marital status: Single    Spouse name: Not  on file  . Number of children: Not on file  . Years of education: Not on file  . Highest education level: Not on file  Occupational History  . Not on file  Tobacco Use  . Smoking status: Former Smoker    Packs/day: 0.00    Types: Cigars    Quit date: 01/05/2017    Years since quitting: 2.6  . Smokeless tobacco: Never Used  Substance and Sexual Activity  . Alcohol use: Yes    Comment: occasional  . Drug use: No  . Sexual activity: Yes    Birth control/protection: None  Other Topics Concern  . Not on file  Social History Narrative   Lives home with mother.  Works at [employer].  Education 10th grade/GED.  No children.  Single.     Social Determinants of Health   Financial Resource Strain:   . Difficulty of Paying Living Expenses:   Food Insecurity:   . Worried About Charity fundraiser in the Last Year:   . Arboriculturist in the Last Year:   Transportation Needs:   . Lexicographer (Medical):   Marland Kitchen Lack of Transportation (Non-Medical):   Physical Activity:   . Days of Exercise per Week:   . Minutes of Exercise per Session:   Stress:   . Feeling of Stress :   Social Connections:   . Frequency of Communication with Friends and Family:   . Frequency of Social Gatherings with Friends and Family:   . Attends Religious Services:   . Active Member of Clubs or Organizations:   . Attends Archivist Meetings:   Marland Kitchen Marital Status:   Intimate Partner Violence:   . Fear of Current or Ex-Partner:   . Emotionally Abused:   Marland Kitchen Physically Abused:   . Sexually Abused:    PHYSICAL EXAM  Vitals:   08/12/19 1053  BP: 138/82  Pulse: 64  Temp: 97.8 F (36.6 C)  Weight: 174 lb (78.9 kg)  Height: 5' (1.524 m)   Body mass index is 33.98 kg/m.  Generalized: Well developed, in no acute distress   Neurological examination  Mentation: Alert oriented to time, place, history taking. Follows all commands speech and language fluent Cranial nerve II-XII: Pupils were equal round reactive to light. Extraocular movements were full, visual field were full on confrontational test. Facial sensation and strength were normal. Head turning and shoulder shrug  were normal and symmetric. Motor: The motor testing reveals 5 over 5 strength of all 4 extremities. Good symmetric motor tone is noted throughout.  Sensory: Sensory testing is intact to soft touch on all 4 extremities. No evidence of extinction is noted.  Coordination: Cerebellar testing reveals good finger-nose-finger and heel-to-shin bilaterally.  Gait and station: Gait is normal. Tandem gait is normal. Romberg is negative. No drift is seen.  Reflexes: Deep tendon reflexes are symmetric and normal bilaterally.   DIAGNOSTIC DATA (LABS, IMAGING, TESTING) - I reviewed patient records, labs, notes, testing and imaging myself where available.  Lab Results  Component Value Date   WBC 7.5 07/11/2019   HGB 11.1 (L)  07/11/2019   HCT 33.2 (L) 07/11/2019   MCV 94.1 07/11/2019   PLT 172 07/11/2019      Component Value Date/Time   NA 139 07/11/2019 0629   K 3.5 07/11/2019 0629   CL 108 07/11/2019 0629   CO2 24 07/11/2019 0629   GLUCOSE 121 (H) 07/11/2019 0629   BUN 12 07/11/2019 [id number]  CREATININE 0.62 07/11/2019 0629   CREATININE 0.75 06/10/2018 0831   CALCIUM 8.9 07/11/2019 0629   PROT 6.7 07/11/2019 0629   ALBUMIN 3.6 07/11/2019 0629   AST 15 07/11/2019 0629   ALT 21 07/11/2019 0629   ALKPHOS 67 07/11/2019 0629   BILITOT 0.8 07/11/2019 0629   GFRNONAA >60 07/11/2019 0629   GFRNONAA 98 06/10/2018 0831   GFRAA >60 07/11/2019 0629   GFRAA 114 06/10/2018 0831   Lab Results  Component Value Date   CHOL 191 06/10/2018   HDL 44 (L) 06/10/2018   LDLCALC 118 (H) 06/10/2018   TRIG 174 (H) 06/10/2018   CHOLHDL 4.3 06/10/2018   Lab Results  Component Value Date   HGBA1C 5.2 06/10/2018   Lab Results  Component Value Date   VITAMINB12 450 10/26/2010   Lab Results  Component Value Date   TSH 1.67 06/10/2018      ASSESSMENT AND PLAN 44 y.o. year old female  has a past medical history of Anemia, Anxiety, Depressive disorder, not elsewhere classified, Family history of breast cancer, Family history of colon cancer, Family history of lung cancer, Hypertension, Migraine, Migraines, Miscarriage (2009), Neck pain, and Vaginitis. here with:  1.  Chronic migraine headache -Headaches currently under excellent control, only 2 migraines in the last 6 months -Continue Aimovig 70 mg monthly injection -Continue Topamax 100 mg twice a day -Continue Imitrex injection as needed for acute headache  -She will need to discuss with PCP about filling Xanax, oxycodone, we have never filled this -Follow-up in 1 year or sooner if needed   I spent 20 minutes of face-to-face and non-face-to-face time with patient.  This included previsit chart review, lab review, study review, order entry, electronic health  record documentation, patient education.    Butler Denmark, AGNP-C, DNP 08/12/2019, 11:48 AM [facility] [address], [place], [place] [address] [phone]

## 2019-08-28 ENCOUNTER — Telehealth: Payer: Self-pay

## 2019-08-28 NOTE — Telephone Encounter (Signed)
Patient called and said that you were going to be speaking with Dr Moshe Cipro about her xanax and and wanted to know if they are going to be refilled.

## 2019-09-02 ENCOUNTER — Other Ambulatory Visit: Payer: Self-pay

## 2019-09-02 ENCOUNTER — Telehealth: Payer: Self-pay | Admitting: Family Medicine

## 2019-09-09 NOTE — Progress Notes (Signed)
I have reviewed and agreed above plan. 

## 2019-09-10 ENCOUNTER — Encounter: Payer: Self-pay | Admitting: Family Medicine

## 2019-09-10 ENCOUNTER — Ambulatory Visit (INDEPENDENT_AMBULATORY_CARE_PROVIDER_SITE_OTHER): Payer: Self-pay | Admitting: Family Medicine

## 2019-09-10 ENCOUNTER — Other Ambulatory Visit: Payer: Self-pay

## 2019-09-10 VITALS — BP 118/84 | HR 86 | Temp 97.3°F | Resp 15 | Ht 63.0 in | Wt 173.0 lb

## 2019-09-10 DIAGNOSIS — E559 Vitamin D deficiency, unspecified: Secondary | ICD-10-CM

## 2019-09-10 DIAGNOSIS — E669 Obesity, unspecified: Secondary | ICD-10-CM

## 2019-09-10 DIAGNOSIS — Z13220 Encounter for screening for lipoid disorders: Secondary | ICD-10-CM

## 2019-09-10 DIAGNOSIS — M545 Low back pain, unspecified: Secondary | ICD-10-CM

## 2019-09-10 DIAGNOSIS — I10 Essential (primary) hypertension: Secondary | ICD-10-CM

## 2019-09-10 MED ORDER — METHYLPREDNISOLONE ACETATE 80 MG/ML IJ SUSP
80.0000 mg | Freq: Once | INTRAMUSCULAR | Status: AC
  Administered 2019-09-10: 80 mg via INTRAMUSCULAR

## 2019-09-10 MED ORDER — KETOROLAC TROMETHAMINE 60 MG/2ML IM SOLN
60.0000 mg | Freq: Once | INTRAMUSCULAR | Status: AC
  Administered 2019-09-10: 60 mg via INTRAMUSCULAR

## 2019-09-10 NOTE — Assessment & Plan Note (Signed)
Elevated pain score today in the office.  Provided with Depo and Toradol injections  Reviewed side effects, risks and benefits of medication.   Patient acknowledged agreement and understanding of the plan.

## 2019-09-10 NOTE — Assessment & Plan Note (Signed)
Blood pressure has much improved with the use of hydrochlorothiazide and Norvasc reports tolerating both of these well.  Is ready to go back to work.  Has not had any nosebleeds in the least 2 weeks.  If not longer.  Encouraged to follow DASH diet and to get 30 to 60 minutes of exercise on a regular basis.  Patient acknowledged agreement and understanding of the plan.

## 2019-09-10 NOTE — Assessment & Plan Note (Signed)
Obesity is linked to hypertension MARZIA TRIPPLETT is re-educated about the importance of exercise daily to help with weight management. A minumum of 30 minutes daily is recommended. Additionally, importance of healthy food choices  with portion control discussed.  Wt Readings from Last 3 Encounters:  09/10/19 173 lb (78.5 kg)  08/12/19 174 lb (78.9 kg)  08/12/19 174 lb (78.9 kg)

## 2019-09-10 NOTE — Assessment & Plan Note (Signed)
Needs updated lab work.  Advised to get fasting 1 week before next appointment encourage low-fat diet.

## 2019-09-10 NOTE — Assessment & Plan Note (Signed)
Will be getting updated labs prior to next visit.

## 2019-09-10 NOTE — Patient Instructions (Addendum)
I appreciate the opportunity to provide you with care for your health and wellness. Today we discussed: blood pressure   Follow up: Oct for annual exam   Labs week before next appt or referrals today fasting  Back to work note today Pain injections today for back pain  Please continue to practice social distancing to keep you, your family, and our community safe.  If you must go out, please wear a mask and practice good handwashing.  It was a pleasure to see you and I look forward to continuing to work together on your health and well-being. Please do not hesitate to call the office if you need care or have questions about your care.  Have a wonderful day and week. With Gratitude, Cherly Beach, DNP, AGNP-BC

## 2019-09-10 NOTE — Progress Notes (Signed)
Subjective:  Patient ID: Rebecca Boyd, female    DOB: [birth year]  Age: 44 y.o. MRN: [id number]  CC:  Chief Complaint  Patient presents with  . Hypertension    follow up visit and will need note extended or a note to return back to work       HPI  HPI  Rebecca Boyd is a 44 year old female patient of Dr. Griffin Dakin who presents today for follow-up on high blood pressure so that she can return to work.  Additionally she reports that she is having some back pain.  Blood pressure is doing much better.  Has not had nosebleeds in over 2 weeks.  Taking medications as directed..  Denies have any chest pain, headaches, vision changes, dizziness, shortness of breath, leg swelling.  Back pain: Has increased worsening over the last 3 to 4 weeks, predominantly lower back.  Last all day on certain days.  Described as achy pulling throbbing.  Aggravated by different things such as sitting and standing for too long.  Or turning the wrong way.  Ibuprofen helps sometimes.  Pain comes and goes and varies with intensity.  Today she is a 7 out of 10.  Hoping to get injections today in the office.  She denies having any numbness or tingling or cauda equina signs or symptoms.  No sensation changes or weakness in legs.   Today patient denies signs and symptoms of COVID 19 infection including fever, chills, cough, shortness of breath, and headache. Past Medical, Surgical, Social History, Allergies, and Medications have been Reviewed.   Past Medical History:  Diagnosis Date  . Anemia   . Anxiety   . Cervical neck pain with evidence of disc disease 10/02/2013  . Depressive disorder, not elsewhere classified   . Family history of breast cancer   . Family history of breast cancer in sister 09/25/2018  . Family history of colon cancer   . Family history of lung cancer   . FH: breast cancer in relative when <68 years old 06/10/2018   Dx at age 72  . 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 Hypertension   . Migraine   . Migraines   . Miscarriage 2009  . Neck pain   . Nondisplaced fracture of fifth metatarsal bone, left foot, subsequent encounter for fracture with routine healing 08/15/18 09/25/2018  . Pruritus 09/29/2018  . Pruritus ani 09/25/2018  . Rash and nonspecific skin eruption 05/14/2017  . Vaginitis    cyctitis    No outpatient medications have been marked as taking for the 09/10/19 encounter (Office Visit) with Perlie Mayo, NP.   Current Facility-Administered Medications for the 09/10/19 encounter (Office Visit) with Perlie Mayo, NP  Medication  . ketorolac (TORADOL) injection 60 mg  . methylPREDNISolone acetate (DEPO-MEDROL) injection 80 mg    ROS:  Review of Systems  Constitutional: Negative.   HENT: Negative.   Eyes: Negative.   Respiratory: Negative.   Cardiovascular: Negative.   Gastrointestinal: Negative.   Genitourinary: Negative.   Musculoskeletal: Positive for back pain.  Skin: Negative.   Neurological: Negative.   Endo/Heme/Allergies: Negative.   Psychiatric/Behavioral: Negative.   All other systems reviewed and are negative.    Objective:   Today's Vitals: BP 118/84   Pulse 86   Temp (!) 97.3 F (36.3 C) (Temporal)   Resp 15   Ht 5\' 3"  (1.6 m)   Wt 173 lb (78.5 kg)   SpO2 98%  BMI 30.65 kg/m  Vitals with BMI 09/10/2019 08/12/2019 08/12/2019  Height 5\' 3"  5\' 0"  5\' 0"   Weight 173 lbs 174 lbs 174 lbs  BMI 30.65 [id number]  Systolic 123456 [id number]  Diastolic 84 82 82  Pulse 86 78 64  Some encounter information is confidential and restricted. Go to Review Flowsheets activity to see all data.     Physical Exam Vitals and nursing note reviewed.  Constitutional:      Appearance: Normal appearance. She is obese.  HENT:     Head: Normocephalic and atraumatic.     Right Ear: External ear normal.     Left Ear: External ear normal.     Mouth/Throat:     Comments: Mask in place Eyes:     General:        Right eye: No  discharge.        Left eye: No discharge.     Conjunctiva/sclera: Conjunctivae normal.  Cardiovascular:     Rate and Rhythm: Normal rate and regular rhythm.     Pulses: Normal pulses.     Heart sounds: Normal heart sounds.  Pulmonary:     Effort: Pulmonary effort is normal.     Breath sounds: Normal breath sounds.  Musculoskeletal:     Cervical back: Normal range of motion and neck supple.     Comments: No change in lower back range of motion.  Skin:    General: Skin is warm.  Neurological:     General: No focal deficit present.     Mental Status: She is alert and oriented to person, place, and time.  Psychiatric:        Mood and Affect: Mood normal.        Behavior: Behavior normal.        Thought Content: Thought content normal.        Judgment: Judgment normal.    Assessment   1. Low back pain with radiation   2. Vitamin D deficiency   3. Accelerated hypertension   4. Obesity (BMI 30.0-34.9)   5. Lipid screening     Tests ordered Orders Placed This Encounter  Procedures  . CBC  . COMPLETE METABOLIC PANEL WITH GFR  . Hemoglobin A1c  . Lipid panel  . VITAMIN D 25 Hydroxy (Vit-D Deficiency, Fractures)  . TSH     Plan: Please see assessment and plan per problem list above.   Meds ordered this encounter  Medications  . methylPREDNISolone acetate (DEPO-MEDROL) injection 80 mg  . ketorolac (TORADOL) injection 60 mg    Patient to follow-up in October for annual visit  Perlie Mayo, NP
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
## 2019-09-16 ENCOUNTER — Other Ambulatory Visit: Payer: Self-pay | Admitting: Family Medicine

## 2019-09-16 MED ORDER — ALBUTEROL SULFATE HFA 108 (90 BASE) MCG/ACT IN AERS: 2.0000 | INHALATION_SPRAY | Freq: Four times a day (QID) | RESPIRATORY_TRACT | 0 refills | Status: DC | PRN

## 2019-09-19 ENCOUNTER — Telehealth: Payer: Self-pay

## 2019-09-19 NOTE — Telephone Encounter (Signed)
Pt advised to come leave urine put in for first available phone visit 09-25-19

## 2019-09-19 NOTE — Telephone Encounter (Signed)
BORAX SUPPOSITORY--SENT TO NURSE

## 2019-09-22 ENCOUNTER — Other Ambulatory Visit: Payer: Self-pay

## 2019-09-22 ENCOUNTER — Telehealth (INDEPENDENT_AMBULATORY_CARE_PROVIDER_SITE_OTHER): Payer: Self-pay | Admitting: Psychiatry

## 2019-09-22 ENCOUNTER — Encounter (HOSPITAL_COMMUNITY): Payer: Self-pay | Admitting: Psychiatry

## 2019-09-22 DIAGNOSIS — F419 Anxiety disorder, unspecified: Secondary | ICD-10-CM

## 2019-09-22 DIAGNOSIS — F330 Major depressive disorder, recurrent, mild: Secondary | ICD-10-CM

## 2019-09-22 MED ORDER — SERTRALINE HCL 50 MG PO TABS: ORAL_TABLET | ORAL | 1 refills | Status: DC

## 2019-09-22 NOTE — Patient Instructions (Signed)
1. Start sertraline 25 mg daily for one week, then 50 mg daily 2. Discontinue Xanax 3. Please contact your neurologist for evaluation of sleep apnea

## 2019-09-25 ENCOUNTER — Ambulatory Visit (INDEPENDENT_AMBULATORY_CARE_PROVIDER_SITE_OTHER): Payer: Self-pay | Admitting: Family Medicine

## 2019-09-25 ENCOUNTER — Other Ambulatory Visit (HOSPITAL_COMMUNITY)
Admission: RE | Admit: 2019-09-25 | Discharge: 2019-09-25 | Disposition: A | Discharge status: Home / Self Care | Payer: Self-pay | Source: Ambulatory Visit | Attending: Family Medicine | Admitting: Family Medicine

## 2019-09-25 ENCOUNTER — Encounter: Payer: Self-pay | Admitting: Family Medicine

## 2019-09-25 ENCOUNTER — Other Ambulatory Visit: Payer: Self-pay

## 2019-09-25 VITALS — BP 148/88 | HR 68 | Temp 98.1°F | Ht 60.0 in | Wt 173.0 lb

## 2019-09-25 DIAGNOSIS — R350 Frequency of micturition: Secondary | ICD-10-CM

## 2019-09-25 DIAGNOSIS — N3000 Acute cystitis without hematuria: Secondary | ICD-10-CM | POA: Insufficient documentation

## 2019-09-25 DIAGNOSIS — M545 Low back pain, unspecified: Secondary | ICD-10-CM

## 2019-09-25 LAB — POCT URINALYSIS DIP (CLINITEK)
Bilirubin, UA: NEGATIVE
Blood, UA: NEGATIVE
Glucose, UA: NEGATIVE mg/dL
Ketones, POC UA: NEGATIVE mg/dL
Leukocytes, UA: NEGATIVE
Nitrite, UA: NEGATIVE
POC PROTEIN,UA: NEGATIVE
Spec Grav, UA: 1.025 (ref 1.010–1.025)
Urobilinogen, UA: 0.2 E.U./dL
pH, UA: 6 (ref 5.0–8.0)

## 2019-09-25 MED ORDER — KETOROLAC TROMETHAMINE 60 MG/2ML IM SOLN
60.0000 mg | Freq: Once | INTRAMUSCULAR | Status: AC
  Administered 2019-09-25: 60 mg via INTRAMUSCULAR

## 2019-09-25 MED ORDER — METHYLPREDNISOLONE ACETATE 80 MG/ML IJ SUSP
80.0000 mg | Freq: Once | INTRAMUSCULAR | Status: AC
  Administered 2019-09-25: 80 mg via INTRAMUSCULAR

## 2019-09-25 NOTE — Patient Instructions (Signed)
I appreciate the opportunity to provide you with care for your health and wellness. Today we discussed: back pain and possible BV infection   Follow up: 02/11/2020 as scheduled  No labs or referrals today  Urine dip in office neg, will sent out   Injections for pain today in office.  Feel better!   Please continue to practice social distancing to keep you, your family, and our community safe.  If you must go out, please wear a mask and practice good handwashing.  It was a pleasure to see you and I look forward to continuing to work together on your health and well-being. Please do not hesitate to call the office if you need care or have questions about your care.  Have a wonderful day and week. With Gratitude, Cherly Beach, DNP, AGNP-BC

## 2019-09-25 NOTE — Progress Notes (Signed)
Subjective:  Patient ID: Rebecca Boyd, female    DOB: 1976/01/12  Age: 44 y.o. MRN: [id number]  CC:  Chief Complaint  Patient presents with  . Urinary Tract Infection    lower back pain odor frequent urination x 2 weeks       HPI  HPI  Rebecca Boyd is a 44 year old female patient of Dr. Griffin Dakin.  She presents today with questionable urinary tract infection-like symptoms.  She reports that she has pressure in the pelvis all the time.  She reports that she has discomfort in her low back.  However she does have low back pain with radiation.  She reports this has been going on for about 2 weeks.  She denies have any fevers, chills, nausea, vomiting, diarrhea, constipation or any other signs or symptoms or changes in bowel or bladder habits.  She reports that she has a vaginal odor not much vaginal discharge.  She reports that sometimes she gets this after her cycles.   Additionally she reports that her back pain is a 7-8 out of 10.  She is still waiting to get into Dr. Freddie Apley office for pain management. She denies having any numbness or tingling or cauda equina-like syndrome today.  Today patient denies signs and symptoms of COVID 19 infection including fever, chills, cough, shortness of breath, and headache. Past Medical, Surgical, Social History, Allergies, and Medications have been Reviewed.   Past Medical History:  Diagnosis Date  . Anemia   . Anxiety   . Bacterial vaginosis 07/27/2014  . Cervical neck pain with evidence of disc disease 10/02/2013  . Depressive disorder, not elsewhere classified   . Family history of breast cancer   . Family history of breast cancer in sister 09/25/2018  . Family history of colon cancer   . Family history of lung cancer   . FH: breast cancer in relative when <28 years old 06/10/2018   Dx at age 10  . Headache 03/16/2016  . 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 Hypertension   . Migraine   . Migraines   .  Miscarriage 2009  . Neck pain   . Nondisplaced fracture of fifth metatarsal bone, left foot, subsequent encounter for fracture with routine healing 08/15/18 09/25/2018  . Pruritus 09/29/2018  . Pruritus ani 09/25/2018  . Rash and nonspecific skin eruption 05/14/2017  . Vaginitis    cyctitis    Current Meds  Medication Sig  . albuterol (VENTOLIN HFA) 108 (90 Base) MCG/ACT inhaler Inhale 2 puffs into the lungs every 6 (six) hours as needed for wheezing or shortness of breath.  Marland Kitchen amLODipine (NORVASC) 10 MG tablet Take 1 tablet (10 mg total) by mouth daily.  . cetirizine (ZYRTEC ALLERGY) 10 MG tablet Take 1 tablet (10 mg total) by mouth daily.  Eduard Roux (AIMOVIG) 70 MG/ML SOAJ Inject 70 mg into the skin every 30 (thirty) days.  Marland Kitchen FLUoxetine (PROZAC) 20 MG tablet Take 1 tablet (20 mg total) by mouth daily.  . hydrochlorothiazide (HYDRODIURIL) 12.5 MG tablet Take 1 tablet (12.5 mg total) by mouth daily.  . sertraline (ZOLOFT) 50 MG tablet 25 mg daily for one week, then 50 mg daily  . SUMAtriptan (IMITREX) 6 MG/0.5ML SOLN injection Use one injection at onset of migraine.  May repeat in 2 hrs, if needed.  Max dose: 2 inj/day. This is a 30 day prescription.  . topiramate (TOPAMAX) 100 MG tablet Take 1 tablet (100 mg total) by  mouth 2 (two) times daily.  Marland Kitchen UNABLE TO FIND Place 1 suppository vaginally 3 (three) times a week.    ROS:  Review of Systems  HENT: Negative.   Eyes: Negative.   Respiratory: Negative.   Cardiovascular: Negative.   Gastrointestinal: Negative.   Genitourinary: Negative.        Pressure in pelvic area, vaginal odor  Musculoskeletal: Positive for back pain.  Skin: Negative.   Neurological: Negative.   Endo/Heme/Allergies: Negative.   Psychiatric/Behavioral: Negative.   All other systems reviewed and are negative.    Objective:   Today's Vitals: BP (!) 148/88 (BP Location: Left Arm, Patient Position: Sitting, Cuff Size: Normal)   Pulse 68   Temp 98.1 F (36.7  C) (Temporal)   Ht 5' (1.524 m)   Wt 173 lb (78.5 kg)   SpO2 98%   BMI 33.79 kg/m  Vitals with BMI 09/25/2019 09/10/2019 08/12/2019  Height 5\' 0"  5\' 3"  5\' 0"   Weight 173 lbs 173 lbs 174 lbs  BMI 33.79 [id number]  Systolic 123456 123456 [id number]  Diastolic 88 84 82  Pulse 68 86 78  Some encounter information is confidential and restricted. Go to Review Flowsheets activity to see all data.     Physical Exam Vitals and nursing note reviewed.  Constitutional:      Appearance: Normal appearance. She is obese.  HENT:     Head: Normocephalic and atraumatic.     Right Ear: External ear normal.     Left Ear: External ear normal.     Mouth/Throat:     Comments: Mask in place  Eyes:     General:        Right eye: No discharge.        Left eye: No discharge.     Conjunctiva/sclera: Conjunctivae normal.  Cardiovascular:     Rate and Rhythm: Normal rate and regular rhythm.     Pulses: Normal pulses.     Heart sounds: Normal heart sounds.  Pulmonary:     Effort: Pulmonary effort is normal.     Breath sounds: Normal breath sounds.  Musculoskeletal:     Cervical back: Normal range of motion and neck supple.     Comments: Tenderness over low back  Skin:    General: Skin is warm.  Neurological:     General: No focal deficit present.     Mental Status: She is alert and oriented to person, place, and time.  Psychiatric:        Mood and Affect: Mood normal.        Behavior: Behavior normal.        Thought Content: Thought content normal.        Judgment: Judgment normal.      Assessment   1. Low back pain with radiation   2. Frequent urination     Tests ordered Orders Placed This Encounter  Procedures  . POCT URINALYSIS DIP (CLINITEK)     Plan: Please see assessment and plan per problem list above.   Meds ordered this encounter  Medications  . ketorolac (TORADOL) injection 60 mg  . methylPREDNISolone acetate (DEPO-MEDROL) injection 80 mg    Patient to follow-up in  02/11/2020  Perlie Mayo, NP

## 2019-09-26 ENCOUNTER — Encounter: Payer: Self-pay | Admitting: Family Medicine

## 2019-09-26 NOTE — Assessment & Plan Note (Signed)
UA dip in the office negative for infection.  Encouraged her to increase water intake overall and if symptoms did not improve to call us back.

## 2019-09-26 NOTE — Assessment & Plan Note (Signed)
Elevated pain score today in the office will do Depo and Toradol injections.  Advised for her to follow-up with pain management as we have previously discussed.

## 2019-09-30 LAB — URINE CYTOLOGY ANCILLARY ONLY
Chlamydia: NEGATIVE
Comment: NEGATIVE
Comment: NORMAL
Neisseria Gonorrhea: NEGATIVE

## 2019-10-15 ENCOUNTER — Other Ambulatory Visit (HOSPITAL_COMMUNITY): Payer: Self-pay | Admitting: Neurology

## 2019-10-15 ENCOUNTER — Other Ambulatory Visit: Payer: Self-pay

## 2019-10-15 ENCOUNTER — Ambulatory Visit (HOSPITAL_COMMUNITY)
Admission: RE | Admit: 2019-10-15 | Discharge: 2019-10-15 | Disposition: A | Discharge status: Home / Self Care | Payer: 59 | Source: Ambulatory Visit | Attending: Neurology | Admitting: Neurology

## 2019-10-15 DIAGNOSIS — M545 Low back pain, unspecified: Secondary | ICD-10-CM

## 2019-10-15 DIAGNOSIS — M542 Cervicalgia: Secondary | ICD-10-CM

## 2019-10-15 DIAGNOSIS — M546 Pain in thoracic spine: Secondary | ICD-10-CM | POA: Diagnosis present

## 2019-10-20 ENCOUNTER — Telehealth: Payer: Self-pay

## 2019-10-20 NOTE — Telephone Encounter (Signed)
PA has been sent via CMM for Aimovig 70MG /ML auto-injectors (Key: [id number]) Rx #: [id number]    Form [employer]Formerly [employer]) 4-Part NCPDP Electronic PA Form

## 2019-10-21 NOTE — Telephone Encounter (Signed)
PA for aimovig has been approved  Approval valid from 10/20/2019-04/17/2020  LVM to inform pt Notice faxed to pharmacy

## 2019-10-28 ENCOUNTER — Other Ambulatory Visit: Payer: Self-pay

## 2019-10-28 ENCOUNTER — Other Ambulatory Visit (HOSPITAL_COMMUNITY)
Admission: RE | Admit: 2019-10-28 | Discharge: 2019-10-28 | Disposition: A | Discharge status: Home / Self Care | Payer: 59 | Source: Ambulatory Visit | Attending: Family Medicine | Admitting: Family Medicine

## 2019-10-28 ENCOUNTER — Encounter: Payer: Self-pay | Admitting: Family Medicine

## 2019-10-28 ENCOUNTER — Ambulatory Visit (INDEPENDENT_AMBULATORY_CARE_PROVIDER_SITE_OTHER): Payer: 59 | Admitting: Family Medicine

## 2019-10-28 VITALS — BP 140/92 | HR 86 | Resp 16 | Ht 60.0 in | Wt 175.0 lb

## 2019-10-28 DIAGNOSIS — I10 Essential (primary) hypertension: Secondary | ICD-10-CM

## 2019-10-28 DIAGNOSIS — M542 Cervicalgia: Secondary | ICD-10-CM

## 2019-10-28 DIAGNOSIS — F323 Major depressive disorder, single episode, severe with psychotic features: Secondary | ICD-10-CM

## 2019-10-28 DIAGNOSIS — M546 Pain in thoracic spine: Secondary | ICD-10-CM

## 2019-10-28 DIAGNOSIS — N760 Acute vaginitis: Secondary | ICD-10-CM | POA: Diagnosis present

## 2019-10-28 DIAGNOSIS — E669 Obesity, unspecified: Secondary | ICD-10-CM

## 2019-10-28 DIAGNOSIS — F419 Anxiety disorder, unspecified: Secondary | ICD-10-CM | POA: Diagnosis not present

## 2019-10-28 DIAGNOSIS — Z1159 Encounter for screening for other viral diseases: Secondary | ICD-10-CM

## 2019-10-28 DIAGNOSIS — J4520 Mild intermittent asthma, uncomplicated: Secondary | ICD-10-CM

## 2019-10-28 DIAGNOSIS — E66811 Obesity, class 1: Secondary | ICD-10-CM

## 2019-10-28 DIAGNOSIS — G8929 Other chronic pain: Secondary | ICD-10-CM

## 2019-10-28 MED ORDER — SPIRONOLACTONE 25 MG PO TABS: 25.0000 mg | ORAL_TABLET | Freq: Every day | ORAL | 5 refills | Status: DC

## 2019-10-28 MED ORDER — METRONIDAZOLE 0.75 % VA GEL: 1.0000 | Freq: Two times a day (BID) | VAGINAL | 0 refills | Status: DC

## 2019-10-28 NOTE — Assessment & Plan Note (Addendum)
Score of 15 in 09/2019, has upcoming psych appt, refer to therapist

## 2019-10-28 NOTE — Patient Instructions (Addendum)
Follow-up in 6 to 8 weeks with MD reevaluate blood pressure and chronic health concerns.  Additional medicine started today for your blood pressure which is still elevated.  Start spironolactone 25 mg 1 daily and continue other blood pressure medication as before.  You are referred to therapist for counseling for help with anxiety and depression.  You are referred to for physical therapy for management of chronic  neck and upper back pain.  Please commit to weight daily exercise 30 minutes 5 days/week at least.  Please commit to reducing portion sizes of food and increasing vegetables eliminating sugar so that she will lose weight and improve your health.  Please get the Covid vaccine at your pharmacy.  Fasting labs today with hepatitis C screen to be added MetroGel as prescribed for 1 week for vaginitis and specimen is also being sent for testing  Thanks for choosing [facility], we consider it a privelige to serve you.

## 2019-10-28 NOTE — Progress Notes (Signed)
Virtual Visit via Video Note  I connected with Rebecca Boyd on 10/31/19 at 10:30 AM EDT by a video enabled telemedicine application and verified that I am speaking with the correct person using two identifiers.   I discussed the limitations of evaluation and management by telemedicine and the availability of in person appointments. The patient expressed understanding and agreed to proceed.    I discussed the assessment and treatment plan with the patient. The patient was provided an opportunity to ask questions and all were answered. The patient agreed with the plan and demonstrated an understanding of the instructions.   The patient was advised to call back or seek an in-person evaluation if the symptoms worsen or if the condition fails to improve as anticipated.  Location: patient- home, provider- home office   I provided 18 minutes of non-face-to-face time during this encounter.   [name], MD    [facility] MD/PA/NP OP Progress Note  10/31/2019 10:52 AM Rebecca Boyd  MRN:  [id number]  Chief Complaint:  Chief Complaint    Follow-up; Anxiety; Depression     HPI:  This is a follow-up appointment for depression and anxiety .  She states that she feels about the same.  She continues to feel irritable and anxious.  She is worried about what to do next.  She has been taking care of her sister and her mother.  She will bring them to the appointments, and helps them as they are unable to do household chores or heavy lifting.  She watches TV most of the time.  She is willing to return to work once they are open, although she does not like the environment.  She has insomnia.  She feels fatigued.  She has good concentration and appetite.  She denies SI.  She feels anxious and tense.  She has occasional panic attacks.  She has VH of last night, and people standing.  She has AH of some moving noises.  She denies paranoia. She has been able to do exercise despite her back pain.   Daily  routine: watch TV,  Exercise: takes a walk,  everyday Employment: Glass blower/designer for five years, currently shut down due to pandemic Household: mother, sister,  Marital status: single Number of children: 0  Visit Diagnosis:    ICD-10-CM   1. GAD (generalized anxiety disorder)  F41.1   2. MDD (major depressive disorder), recurrent episode, mild ([place])  F33.0     Past Psychiatric History: Please see initial evaluation for full details. I have reviewed the history. No updates at this time.     Past Medical History:  Past Medical History:  Diagnosis Date  . Anemia   . Anxiety   . Bacterial vaginosis 07/27/2014  . Cervical neck pain with evidence of disc disease 10/02/2013  . Depressive disorder, not elsewhere classified   . Family history of breast cancer   . Family history of breast cancer in sister 09/25/2018  . Family history of colon cancer   . Family history of lung cancer   . FH: breast cancer in relative when <55 years old 06/10/2018   Dx at age 53  . Headache 03/16/2016  . 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 Hypertension   . Migraine   . Migraines   . Miscarriage 2009  . Neck pain   . Nondisplaced fracture of fifth metatarsal bone, left foot, subsequent encounter for fracture with routine healing 08/15/18 09/25/2018  . Pruritus 09/29/2018  .  Pruritus ani 09/25/2018  . Rash and nonspecific skin eruption 05/14/2017  . Vaginitis    cyctitis    Past Surgical History:  Procedure Laterality Date  . cervical cryotherapy N/A 1999  . DILITATION & CURRETTAGE/HYSTROSCOPY WITH THERMACHOICE ABLATION  08/26/2013   Procedure: DILATATION & CURETTAGE/HYSTEROSCOPY WITH THERMACHOICE ENDOMETRIAL ABLATION Procedure #2 Total Therapy Time=min       sec;  Surgeon: Jonnie Kind, MD;  Location: [facility];  Service: Gynecology;;  . LAPAROSCOPIC BILATERAL SALPINGECTOMY Bilateral 08/26/2013   Procedure: LAPAROSCOPIC BILATERAL SALPINGECTOMY AND REMOVAL OF LEFT PERITUBAL CYST  Procedure #1;  Surgeon: Jonnie Kind, MD;  Location: [facility];  Service: Gynecology;  Laterality: Bilateral;  . LAPAROSCOPIC LYSIS OF ADHESIONS  08/26/2013   Procedure: LAPAROSCOPIC LYSIS OF ADHESIONS Procedure #1;  Surgeon: Jonnie Kind, MD;  Location: [facility];  Service: Gynecology;;  . Carmelia Bake LESION REMOVAL  08/26/2013   Procedure: REMOVAL OF VULVAR SEBACEOUS CYST Procedure #3;  Surgeon: Jonnie Kind, MD;  Location: [facility];  Service: Gynecology;;    Family Psychiatric History: Please see initial evaluation for full details. I have reviewed the history. No updates at this time.     Family History:  Family History  Problem Relation Age of Onset  . Drug abuse Mother   . Hypertension Father   . Breast cancer Sister        dx 58  . Hypertension Maternal Grandmother   . Hypertension Maternal Grandfather   . Hypertension Paternal Grandmother   . Breast cancer Paternal Grandmother        dx late 94s  . Colon cancer Cousin 78    Social History:  Social History   Socioeconomic History  . Marital status: Single    Spouse name: Not on file  . Number of children: Not on file  . Years of education: Not on file  . Highest education level: Not on file  Occupational History  . Not on file  Tobacco Use  . Smoking status: Former Smoker    Packs/day: 0.00    Types: Cigars    Quit date: 01/05/2017    Years since quitting: 2.8  . Smokeless tobacco: Never Used  Vaping Use  . Vaping Use: Never used  Substance and Sexual Activity  . Alcohol use: Yes    Comment: occasional  . Drug use: No  . Sexual activity: Yes    Birth control/protection: None  Other Topics Concern  . Not on file  Social History Narrative   Lives home with mother.  Works at [employer].  Education 10th grade/GED.  No children.  Single.     Social Determinants of Health   Financial Resource Strain:   . Difficulty of Paying Living Expenses:   Food Insecurity:   . Worried About Charity fundraiser in  the Last Year:   . Arboriculturist in the Last Year:   Transportation Needs:   . Film/video editor (Medical):   Marland Kitchen Lack of Transportation (Non-Medical):   Physical Activity:   . Days of Exercise per Week:   . Minutes of Exercise per Session:   Stress:   . Feeling of Stress :   Social Connections:   . Frequency of Communication with Friends and Family:   . Frequency of Social Gatherings with Friends and Family:   . Attends Religious Services:   . Active Member of Clubs or Organizations:   . Attends Archivist Meetings:   Marland Kitchen Marital Status:  Allergies:  Allergies  Allergen Reactions  . Diphenhydramine Hcl Anaphylaxis    (Benadryl)  . Orange Fruit Hives, Shortness Of Breath, Itching and Other (See Comments)    MAKES IT DIFFICULT TO BREATH  . Latex Rash    Metabolic Disorder Labs: Lab Results  Component Value Date   HGBA1C 5.2 06/10/2018   MPG 103 06/10/2018   MPG 108 05/29/2013   No results found for: PROLACTIN Lab Results  Component Value Date   CHOL 191 06/10/2018   TRIG 174 (H) 06/10/2018   HDL 44 (L) 06/10/2018   CHOLHDL 4.3 06/10/2018   VLDL 19 05/10/2016   LDLCALC 118 (H) 06/10/2018   LDLCALC 117 (H) 05/10/2016   Lab Results  Component Value Date   TSH 1.67 06/10/2018   TSH 1.12 05/10/2016    Therapeutic Level Labs: No results found for: LITHIUM No results found for: VALPROATE No components found for:  CBMZ  Current Medications: Current Outpatient Medications  Medication Sig Dispense Refill  . albuterol (VENTOLIN HFA) 108 (90 Base) MCG/ACT inhaler Inhale 2 puffs into the lungs every 6 (six) hours as needed for wheezing or shortness of breath. 18 g 0  . amLODipine (NORVASC) 10 MG tablet Take 1 tablet (10 mg total) by mouth daily. 30 tablet 1  . cetirizine (ZYRTEC ALLERGY) 10 MG tablet Take 1 tablet (10 mg total) by mouth daily. 30 tablet 1  . Erenumab-aooe (AIMOVIG) 70 MG/ML SOAJ Inject 70 mg into the skin every 30 (thirty) days. 1 pen  11  . fluticasone furoate-vilanterol (BREO ELLIPTA) 100-25 MCG/INH AEPB Inhale 1 puff into the lungs in the morning and at bedtime. 60 each 5  . metroNIDAZOLE (METROGEL VAGINAL) 0.75 % vaginal gel Place 1 Applicatorful vaginally 2 (two) times daily. 70 g 0  . sertraline (ZOLOFT) 100 MG tablet Take 1 tablet (100 mg total) by mouth daily. 30 tablet 1  . spironolactone (ALDACTONE) 25 MG tablet Take 1 tablet (25 mg total) by mouth daily. 30 tablet 5  . SUMAtriptan (IMITREX) 6 MG/0.5ML SOLN injection Use one injection at onset of migraine.  May repeat in 2 hrs, if needed.  Max dose: 2 inj/day. This is a 30 day prescription. 5 mL 11  . topiramate (TOPAMAX) 100 MG tablet Take 1 tablet (100 mg total) by mouth 2 (two) times daily. 180 tablet 4  . UNABLE TO FIND Place 1 suppository vaginally 3 (three) times a week. 15 suppository 1   No current facility-administered medications for this visit.     Musculoskeletal: Strength & Muscle Tone: N/A Gait & Station: N/A Patient leans: N/A  Psychiatric Specialty Exam: Review of Systems  Psychiatric/Behavioral: Positive for dysphoric mood and sleep disturbance. Negative for agitation, behavioral problems, confusion, decreased concentration, hallucinations, self-injury and suicidal ideas. The patient is nervous/anxious. The patient is not hyperactive.   All other systems reviewed and are negative.   There were no vitals taken for this visit.There is no height or weight on file to calculate BMI.  General Appearance: Fairly Groomed  Eye Contact:  Good  Speech:  Clear and Coherent  Volume:  Normal  Mood:  Anxious  Affect:  NA, Congruent and less restricted  Thought Process:  Coherent  Orientation:  Full (Time, Place, and Person)  Thought Content: Logical   Suicidal Thoughts:  No  Homicidal Thoughts:  No  Memory:  Immediate;   Good  Judgement:  Good  Insight:  Good  Psychomotor Activity:  Normal  Concentration:  Concentration: Good and Attention Span:  Good  Recall:  Good  Fund of Knowledge: Good  Language: Good  Akathisia:  No  Handed:  Right  AIMS (if indicated): not done  Assets:  Communication Skills Desire for Improvement  ADL's:  Intact  Cognition: WNL  Sleep:  Poor   Screenings: GAD-7     Office Visit from 10/28/2019 in [facility] Office Visit from 09/25/2019 in [facility] Office Visit from 05/21/2019 in [facility]  Total GAD-7 Score 15 6 18     PHQ2-9     Office Visit from 10/28/2019 in [place] Visit from 09/25/2019 in [place] Visit from 09/10/2019 in [place] Visit from 05/21/2019 in [place] Visit from 04/10/2019 in [facility]  PHQ-2 Total Score 2 1 6 4  0  PHQ-9 Total Score 6 3 12 7  --       Assessment and Plan:  Rebecca Boyd is a 44 y.o. year old female with a history of Depressive Disorder NOS and Psychotic Disorder NOS, hypertension, migraine , who presents for follow up appointment for below.   1. GAD (generalized anxiety disorder) 2. MDD (major depressive disorder), recurrent episode, mild ([place]) She continues to have depressive symptoms with prominent anxiety and irritability since the last visit.  Psychosocial stressors includes taking care of her family members, who have medical condition and back pain.  Will uptitrate sertraline to optimize treatment for depression and anxiety.  We will continue to monitor drowsiness.  Coached behavioral activation.  She will greatly benefit from CBT; she has an upcoming appointment with Ms. Bynum.   Plan 1.Increase sertraline 100 mg daily  2.Next appointment- 8/13 at 11:10 for 20 mins, video  - She has an upcoming appointment with neurologist for evaluation of sleep apnea later in the year - check TSH at the next visit  The patient demonstrates the following risk factors for suicide: Chronic risk factors for suicide include:  psychiatric disorder of depression. Acute risk factors for suicide include: loss (financial, interpersonal, professional). Protective factors for this patient include: coping skills and hope for the future. Considering these factors, the overall suicide risk at this point appears to be low. Patient is appropriate for outpatient follow up.  [name], MD 10/31/2019, 10:52 AM

## 2019-10-28 NOTE — Progress Notes (Signed)
Rebecca Boyd     MRN: [id number]      DOB: 06/30/1975   HPI Rebecca Boyd is here for follow up and re-evaluation of chronic medical conditions, medication management and review of any available recent lab and radiology data.  Preventive health is updated, specifically  Cancer screening and Immunization.   Questions or concerns regarding consultations or procedures which the PT has had in the interim are  addressed. The PT denies any adverse reactions to current medications since the last visit.  2 week h/o malodorous vaginal discharge   ROS Denies recent fever or chills. Denies sinus pressure, nasal congestion, ear pain or sore throat. Denies chest congestion, productive cough or wheezing. Denies chest pains, palpitations and leg swelling Denies abdominal pain, nausea, vomiting,diarrhea or constipation.   Denies dysuria, frequency or dysuria C/o back pain  and limitation in mobility. Denies headaches, seizures, numbness, or tingling. Denies depression,c/o  anxiety . Denies skin break down or rash.   PE  BP (!) 140/92   Pulse 86   Resp 16   Ht 5' (1.524 m)   Wt 175 lb (79.4 kg)   SpO2 97%   BMI 34.18 kg/m    Patient alert and oriented and in no cardiopulmonary distress.  HEENT: No facial asymmetry, EOMI,     Neck decreased rOM.  Chest: Clear to auscultation bilaterally.tender over thoracic spine pain with upper bodymovement CVS: S1, S2 no murmurs, no S3.Regular rate.  ABD: Soft non tender.   Ext: No edema  MS: Adequate ROM spine, shoulders, hips and reduced in  knees.  Skin: Intact, no ulcerations or rash noted.  Psych: Good eye contact, normal affect. Memory intact not anxious or depressed appearing.  CNS: CN 2-12 intact, power,  normal throughout.no focal deficits noted.   Assessment & Plan  Anxiety Score of 15 in 09/2019, has upcoming psych appt, refer to therapist  Obesity (BMI 30.0-34.9)  Patient re-educated about  the importance of commitment to  a  minimum of 150 minutes of exercise per week as able.  The importance of healthy food choices with portion control discussed, as well as eating regularly and within a 12 hour window most days. The need to choose "clean , green" food 50 to 75% of the time is discussed, as well as to make water the primary drink and set a goal of 64 ounces water daily.    Weight /BMI 10/28/2019 09/25/2019 09/10/2019  WEIGHT 175 lb 173 lb 173 lb  HEIGHT 5\' 0"  5\' 0"  5\' 3"   BMI 34.18 kg/m2 33.79 kg/m2 30.65 kg/m2  Some encounter information is confidential and restricted. Go to Review Flowsheets activity to see all data.      Essential hypertension, benign Uncontrolled, spironolactone added DASH diet and commitment to daily physical activity for a minimum of 30 minutes discussed and encouraged, as a part of hypertension management. The importance of attaining a healthy weight is also discussed.  BP/Weight 10/28/2019 09/25/2019 09/10/2019 08/12/2019 08/12/2019 07/22/2019 09/26/4130  Systolic BP 440 102 725 366 440 347 425  Diastolic BP 92 88 84 82 82 78 104  Wt. (Lbs) 175 173 173 174 174 173 165  BMI 34.18 33.79 30.65 33.98 33.98 33.79 32.22  Some encounter information is confidential and restricted. Go to Review Flowsheets activity to see all data.       Major depression with psychotic features [facility]) Managed by psychiatry and improving. Needs referral to therapy for help with anxiety. Multiple stressors in her personal and family  life, anxious all the trime  Vulvovaginitis C/io fishy odor x 1 week, specimens sent for testing, metrogel prescribed , has recurrent BV  Neck pain Chronic with mild arthritis and disc disease, refer PT, pain management through pain cliic. I recommend she taper off of opioids  Chronic thoracic spine pain Refer pT for 6 weeks, hope is that she will taper off of opioids  Asthma Reports using rescue inhaler at least 3 times weekly currently, she is to start daily preventive  medication

## 2019-10-29 ENCOUNTER — Encounter: Payer: Self-pay | Admitting: Family Medicine

## 2019-10-29 DIAGNOSIS — G8929 Other chronic pain: Secondary | ICD-10-CM | POA: Insufficient documentation

## 2019-10-29 DIAGNOSIS — M546 Pain in thoracic spine: Secondary | ICD-10-CM | POA: Insufficient documentation

## 2019-10-29 DIAGNOSIS — J45909 Unspecified asthma, uncomplicated: Secondary | ICD-10-CM | POA: Insufficient documentation

## 2019-10-29 MED ORDER — FLUTICASONE FUROATE-VILANTEROL 100-25 MCG/INH IN AEPB: 1.0000 | INHALATION_SPRAY | Freq: Two times a day (BID) | RESPIRATORY_TRACT | 5 refills | Status: DC

## 2019-10-29 NOTE — Assessment & Plan Note (Signed)
Reports using rescue inhaler at least 3 times weekly currently, she is to start daily preventive medication

## 2019-10-29 NOTE — Assessment & Plan Note (Addendum)
C/io fishy odor x 1 week, specimens sent for testing, metrogel prescribed , has recurrent BV

## 2019-10-29 NOTE — Assessment & Plan Note (Signed)
Refer pT for 6 weeks, hope is that she will taper off of opioids

## 2019-10-29 NOTE — Assessment & Plan Note (Signed)
Uncontrolled, spironolactone added DASH diet and commitment to daily physical activity for a minimum of 30 minutes discussed and encouraged, as a part of hypertension management. The importance of attaining a healthy weight is also discussed.  BP/Weight 10/28/2019 09/25/2019 09/10/2019 08/12/2019 08/12/2019 07/22/2019 5/62/5638  Systolic BP 937 342 876 811 572 620 355  Diastolic BP 92 88 84 82 82 78 104  Wt. (Lbs) 175 173 173 174 174 173 165  BMI 34.18 33.79 30.65 33.98 33.98 33.79 32.22  Some encounter information is confidential and restricted. Go to Review Flowsheets activity to see all data.

## 2019-10-29 NOTE — Assessment & Plan Note (Signed)
  Patient re-educated about  the importance of commitment to a  minimum of 150 minutes of exercise per week as able.  The importance of healthy food choices with portion control discussed, as well as eating regularly and within a 12 hour window most days. The need to choose "clean , green" food 50 to 75% of the time is discussed, as well as to make water the primary drink and set a goal of 64 ounces water daily.    Weight /BMI 10/28/2019 09/25/2019 09/10/2019  WEIGHT 175 lb 173 lb 173 lb  HEIGHT 5\' 0"  5\' 0"  5\' 3"   BMI 34.18 kg/m2 33.79 kg/m2 30.65 kg/m2  Some encounter information is confidential and restricted. Go to Review Flowsheets activity to see all data.

## 2019-10-29 NOTE — Assessment & Plan Note (Signed)
Chronic with mild arthritis and disc disease, refer PT, pain management through pain cliic. I recommend she taper off of opioids

## 2019-10-29 NOTE — Assessment & Plan Note (Signed)
Managed by psychiatry and improving. Needs referral to therapy for help with anxiety. Multiple stressors in her personal and family life, anxious all the trime

## 2019-10-31 ENCOUNTER — Encounter (HOSPITAL_COMMUNITY): Payer: Self-pay | Admitting: Psychiatry

## 2019-10-31 ENCOUNTER — Telehealth (INDEPENDENT_AMBULATORY_CARE_PROVIDER_SITE_OTHER): Payer: 59 | Admitting: Psychiatry

## 2019-10-31 ENCOUNTER — Other Ambulatory Visit: Payer: Self-pay

## 2019-10-31 DIAGNOSIS — F330 Major depressive disorder, recurrent, mild: Secondary | ICD-10-CM | POA: Diagnosis not present

## 2019-10-31 DIAGNOSIS — F411 Generalized anxiety disorder: Secondary | ICD-10-CM

## 2019-10-31 LAB — URINE CYTOLOGY ANCILLARY ONLY
Bacterial Vaginitis-Urine: NEGATIVE
Candida Urine: NEGATIVE
Chlamydia: NEGATIVE
Comment: NEGATIVE
Comment: NEGATIVE
Comment: NORMAL
Neisseria Gonorrhea: NEGATIVE
Trichomonas: NEGATIVE

## 2019-10-31 MED ORDER — SERTRALINE HCL 100 MG PO TABS: 100.0000 mg | ORAL_TABLET | Freq: Every day | ORAL | 1 refills | Status: DC

## 2019-10-31 NOTE — Patient Instructions (Signed)
1.Increase sertraline 100 mg daily  2.Next appointment- 8/13 at 11:10

## 2019-11-13 ENCOUNTER — Ambulatory Visit (HOSPITAL_COMMUNITY): Payer: 59 | Attending: Family Medicine

## 2019-11-13 ENCOUNTER — Other Ambulatory Visit: Payer: Self-pay

## 2019-11-13 ENCOUNTER — Encounter (HOSPITAL_COMMUNITY): Payer: Self-pay

## 2019-11-13 DIAGNOSIS — M546 Pain in thoracic spine: Secondary | ICD-10-CM | POA: Insufficient documentation

## 2019-11-13 DIAGNOSIS — R29898 Other symptoms and signs involving the musculoskeletal system: Secondary | ICD-10-CM | POA: Insufficient documentation

## 2019-11-13 NOTE — Therapy (Signed)
Rebecca Boyd, [place], [address] Phone: [phone]   Fax:  [phone]  Physical Therapy Evaluation  Patient Details  Name: Rebecca Boyd MRN: [id number] Date of Birth: May 26, 1975 Referring Provider (PT): Fayrene Helper,  MD   Encounter Date: 11/13/2019   PT End of Session - 11/13/19 1612    Visit Number 1    Number of Visits 12    Date for PT Re-Evaluation 12/25/19    Authorization Type [employer] (30 VL, no auth)    Authorization Time Period 11/13/19 to 12/25/19    PT Start Time 1519    PT Stop Time 1600    PT Time Calculation (min) 41 min    Activity Tolerance Patient tolerated treatment well    Behavior During Therapy [facility] for tasks assessed/performed           Past Medical History:  Diagnosis Date  . Anemia   . Anxiety   . Bacterial vaginosis 07/27/2014  . Cervical neck pain with evidence of disc disease 10/02/2013  . Depressive disorder, not elsewhere classified   . Family history of breast cancer   . Family history of breast cancer in sister 09/25/2018  . Family history of colon cancer   . Family history of lung cancer   . FH: breast cancer in relative when <7 years old 06/10/2018   Dx at age 35  . Headache 03/16/2016  . 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 Hypertension   . Migraine   . Migraines   . Miscarriage 2009  . Neck pain   . Nondisplaced fracture of fifth metatarsal bone, left foot, subsequent encounter for fracture with routine healing 08/15/18 09/25/2018  . Pruritus 09/29/2018  . Pruritus ani 09/25/2018  . Rash and nonspecific skin eruption 05/14/2017  . Vaginitis    cyctitis    Past Surgical History:  Procedure Laterality Date  . cervical cryotherapy N/A 1999  . DILITATION & CURRETTAGE/HYSTROSCOPY WITH THERMACHOICE ABLATION  08/26/2013   Procedure: DILATATION & CURETTAGE/HYSTEROSCOPY WITH THERMACHOICE ENDOMETRIAL ABLATION Procedure #2 Total Therapy  Time=min       sec;  Surgeon: Jonnie Kind, MD;  Location: [facility];  Service: Gynecology;;  . LAPAROSCOPIC BILATERAL SALPINGECTOMY Bilateral 08/26/2013   Procedure: LAPAROSCOPIC BILATERAL SALPINGECTOMY AND REMOVAL OF LEFT PERITUBAL CYST Procedure #1;  Surgeon: Jonnie Kind, MD;  Location: [facility];  Service: Gynecology;  Laterality: Bilateral;  . LAPAROSCOPIC LYSIS OF ADHESIONS  08/26/2013   Procedure: LAPAROSCOPIC LYSIS OF ADHESIONS Procedure #1;  Surgeon: Jonnie Kind, MD;  Location: [facility];  Service: Gynecology;;  . Carmelia Bake LESION REMOVAL  08/26/2013   Procedure: REMOVAL OF VULVAR SEBACEOUS CYST Procedure #3;  Surgeon: Jonnie Kind, MD;  Location: [facility];  Service: Gynecology;;    There were no vitals filed for this visit.    Subjective Assessment - 11/13/19 1522    Subjective Pt reports constant ache and pain all the time above the bra strap, between her shoulder blades. Pt reports low back isn't as annoying and her mid back pain occasionally goes into neck. Pt reports good success with therapy in the past; has taken therapy on/off since 2007 but no longer has the exercises. Pt reports epidural injection in lumbar spine in the past with good success. Pt reports bil feet tingle all the time. Pt denies bowel/bladder incontinence, denies loss of BLE or BE use, denies hand tingling, denies dropping things or falls.  Pt reports pushing through the pain to complete tasks. Pt reports laying down, stretching and medication help improve pain. Pt reports pain ranges 0/10 to 8/10. Pt assisting sister with household chores, driving her to appointments, but no physical assistance. Pt reports pain can wake her up in the night, but not every night.    Pertinent History HTN, migraines    Limitations Sitting;Standing;Walking    How long can you sit comfortably? 15 minutes    How long can you stand comfortably? 30 minutes    How long can you walk comfortably? 30 minutes    Diagnostic tests 6/16 xrays:  thoracic and lumbar spine (-), cervical spine with mild reveral cervical lordosis with mild C5-C6 degenerative changes    Patient Stated Goals to eliminate the pain as much as possible    Currently in Pain? Yes    Pain Score 8     Pain Location Back    Pain Orientation Mid   thoracic spine   Pain Descriptors / Indicators Aching;Throbbing    Pain Type Chronic pain    Pain Radiating Towards none    Pain Onset More than a month ago    Pain Frequency Constant    Aggravating Factors  prolonged positions    Pain Relieving Factors laying down, medication, stretching    Effect of Pain on Daily Activities pushes through the pain              [facility] PT Assessment - 11/13/19 0001      Assessment   Medical Diagnosis Neck and thoracic spine pain    Referring Provider (PT) Fayrene Helper,  MD    Onset Date/Surgical Date --   "years"   Next MD Visit August 2021    Prior Therapy off/on for years since 2007      Precautions   Precautions None      Restrictions   Weight Bearing Restrictions No      Balance Screen   Has the patient fallen in the past 6 months No    Has the patient had a decrease in activity level because of a fear of falling?  No    Is the patient reluctant to leave their home because of a fear of falling?  No      Prior Function   Level of Independence Independent    Leisure Assisting with sister care      Cognition   Overall Cognitive Status Within Functional Limits for tasks assessed      Observation/Other Assessments   Focus on Therapeutic Outcomes (FOTO)  37% limited      Sensation   Light Touch Appears Intact      Posture/Postural Control   Posture/Postural Control Postural limitations    Postural Limitations Decreased lumbar lordosis;Decreased thoracic kyphosis    Posture Comments decreased spinal curve, protracted shoulders      ROM / Strength   AROM / PROM / Strength Strength;AROM      AROM   Overall AROM Comments BUE symmetrical and no deficits  bilaterally    AROM Assessment Site Lumbar;Thoracic;Shoulder    Right Shoulder Internal Rotation --   T7   Right Shoulder External Rotation --   T4   Left Shoulder Internal Rotation --   T7   Left Shoulder External Rotation --   T4   Lumbar Flexion WNL    Lumbar Extension WNL    Lumbar - Right Side Bend WNL    Lumbar - Left Side Bend WNL  Lumbar - Right Rotation WNL    Lumbar - Left Rotation 25% limited, painful    Thoracic Flexion 50% limited    Thoracic Extension 50% limited    Thoracic - Right Rotation 50% limited    Thoracic - Left Rotation 50% limited, painful      Strength   Overall Strength Comments strength testing in sitting; symmetrical and no deficits through BUE in shoulder flex, ext, IR, ER and elbow flex/ext and grip strength    Strength Assessment Site Hip;Knee;Ankle    Right Hip Flexion 4/5    Right Hip ABduction 4/5   tested in sitting   Left Hip Flexion 4/5    Left Hip ABduction 4/5   tested in sitting   Right Knee Flexion 5/5   tested in sitting   Right Knee Extension 5/5    Left Knee Flexion 5/5   tested in sitting   Left Knee Extension 5/5    Right Ankle Dorsiflexion 5/5    Left Ankle Dorsiflexion 5/5      Palpation   Spinal mobility C6-T9 TTP with T3-8 most tender to gentle PAs, hypomobile    Palpation comment TTP throughout bil rhomboids, middle trap, subscap with L>R; mild TTP throughout upper traps; increased muscle tension throughout thoracic posture musculature      Special Tests    Special Tests --    Other special tests ULTT: (+) Median and Radial bilaterally      Transfers   Comments supine<>sit without deficits, STS without deficit      Ambulation/Gait   Ambulation/Gait Yes    Ambulation/Gait Assistance 7: Independent    Assistive device None    Gait Pattern Within Functional Limits    Gait Comments Pt ambulates into/out of clinic without deficits, no loss of balance, good bil arm swing, denies pain      Balance   Balance Assessed --    no concerns              Objective measurements completed on examination: See above findings.       PT Education - 11/13/19 1612    Education Details Assessment findings, FOTO findings, POC, initiated HEP    Person(s) Educated Patient    Methods Explanation;Demonstration;Handout    Comprehension Verbalized understanding;Returned demonstration            PT Short Term Goals - 11/13/19 1641      PT SHORT TERM GOAL #1   Title Pt will self report 6/10 pain at worst on average during household chores to demo improved strength with functional mobility.    Time 3    Period Weeks    Status New    Target Date 12/04/19      PT SHORT TERM GOAL #2   Title Pt will verbalize compliance and understanding of HEP and perform consistently to improve AROM, strength, and reduce pain with mobility.    Time 3    Period Weeks    Status New             PT Long Term Goals - 11/13/19 1643      PT LONG TERM GOAL #1   Title Pt will self report 4/10 pain at worst on average during household chores to demo improved strength with functional mobility.    Time 6    Period Weeks    Status New    Target Date 12/25/19      PT LONG TERM GOAL #2   Title Pt will improve  FOTO by 10 points to indicate improvement in ability to perform functional tasks with reduced pain.    Time 6    Period Weeks    Status New      PT LONG TERM GOAL #3   Title Pt will improve thoracic spine mobility to WNL to improve posture, standing/sitting tolerance, and begin home exercise program.    Time 6    Period Weeks    Status New                  Plan - 11/13/19 1614    Clinical Impression Statement Pt is a pleasant 44 YO female with chronic thoracic and neck pain. Pt with hypomobility throughout cervical and thoracic spine per gentle PAs, tenderness to palpation throughout thoracic paraspinals into subscap with L>R. Pt also c/o TTP in upper trap, but not as severe. Pt with decreased AROM throughout  thoracic and cervical spine and increase pain with rotation. Statically, pt with flat spinal curve, decreased in all sections demonstrating "flat back" posture. Pt ambulates without deficit, no balance concerns and no BUE/BLE strength deficits. Pt would benefit from skilled PT interventions to improve posture, reduce pain, and establish HEP to continue progressing at home to improve overall QoL.    Personal Factors and Comorbidities Time since onset of injury/illness/exacerbation    Examination-Activity Limitations Sit    Examination-Participation Restrictions Cleaning;Driving    Stability/Clinical Decision Making Evolving/Moderate complexity    Clinical Decision Making Moderate    Rehab Potential Good    PT Frequency 2x / week    PT Duration 6 weeks    PT Treatment/Interventions ADLs/Self Care Home Management;Aquatic Therapy;Cryotherapy;Moist Heat;Traction;Gait training;Stair training;Functional mobility training;Therapeutic activities;Therapeutic exercise;Balance training;Neuromuscular re-education;Patient/family education;Orthotic Fit/Training;Manual techniques;Passive range of motion;Dry needling;Joint Manipulations    PT Next Visit Plan Assess rib mobility. Begin stretches to improve AROM throughout spine (rotation), core activation, posture strengthening. Use manual as needed for pain control, to improve spinal mobility.    PT Home Exercise Plan Eval: thoracic rotation, cat-cow quadruped    Consulted and Agree with Plan of Care Patient           Patient will benefit from skilled therapeutic intervention in order to improve the following deficits and impairments:  Cardiopulmonary status limiting activity, Decreased activity tolerance, Decreased range of motion, Decreased strength, Hypomobility, Increased muscle spasms, Pain  Visit Diagnosis: Pain in thoracic spine  Other symptoms and signs involving the musculoskeletal system     Problem List Patient Active Problem List   Diagnosis  Date Noted  . 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 Asthma 10/29/2019  . Lipid screening 09/10/2019  . Frequent nosebleeds 07/11/2019  . Hypokalemia 07/11/2019  . Accelerated hypertension 07/11/2019  . Family history of lung cancer   . Breast pain, left 02/05/2019  . Major depression with psychotic features ([place]) 12/02/2017  . Chronic migraine 12/06/2016  . Vulvovaginitis 08/10/2016  . Genetic testing 05/10/2016  . Obesity (BMI 30.0-34.9) 03/17/2015  . Essential hypertension, benign 01/07/2014  . Low back pain with radiation 07/30/2013  . Frequent urination 05/18/2011  . Vitamin D deficiency 10/27/2010  . Anxiety 10/26/2010  . Neck pain 07/04/2007     Talbot Grumbling PT, DPT 11/13/19, 4:46 PM [place] [address] [place], [place], [address] Phone: [phone]   Fax:  [phone]  Name: Rebecca Boyd MRN: [id number] Date of Birth: [birth year]

## 2019-11-14 ENCOUNTER — Encounter (HOSPITAL_COMMUNITY): Payer: 59

## 2019-11-17 ENCOUNTER — Ambulatory Visit (HOSPITAL_COMMUNITY): Payer: 59 | Admitting: Psychiatry

## 2019-11-17 ENCOUNTER — Other Ambulatory Visit: Payer: Self-pay

## 2019-11-21 ENCOUNTER — Encounter (HOSPITAL_COMMUNITY): Payer: Self-pay | Admitting: Physical Therapy

## 2019-11-21 ENCOUNTER — Other Ambulatory Visit: Payer: Self-pay

## 2019-11-21 ENCOUNTER — Ambulatory Visit (HOSPITAL_COMMUNITY): Payer: 59 | Admitting: Physical Therapy

## 2019-11-21 DIAGNOSIS — M546 Pain in thoracic spine: Secondary | ICD-10-CM

## 2019-11-21 DIAGNOSIS — R29898 Other symptoms and signs involving the musculoskeletal system: Secondary | ICD-10-CM

## 2019-11-21 NOTE — Therapy (Signed)
[place], [place], [address] Phone: [phone]   Fax:  [phone]  Physical Therapy Treatment  Patient Details  Name: Rebecca Boyd MRN: [id number] Date of Birth: 29-Aug-1975 Referring Provider (PT): Fayrene Helper,  MD   Encounter Date: 11/21/2019   PT End of Session - 11/21/19 1607    Visit Number 2    Number of Visits 12    Date for PT Re-Evaluation 12/25/19    Authorization Type [employer] (30 VL, no auth)    Authorization Time Period 11/13/19 to 12/25/19    PT Start Time 1600    PT Stop Time 1638    PT Time Calculation (min) 38 min    Activity Tolerance Patient tolerated treatment well    Behavior During Therapy [facility] for tasks assessed/performed           Past Medical History:  Diagnosis Date  . Anemia   . Anxiety   . Bacterial vaginosis 07/27/2014  . Cervical neck pain with evidence of disc disease 10/02/2013  . Depressive disorder, not elsewhere classified   . Family history of breast cancer   . Family history of breast cancer in sister 09/25/2018  . Family history of colon cancer   . Family history of lung cancer   . FH: breast cancer in relative when <47 years old 06/10/2018   Dx at age 63  . Headache 03/16/2016  . 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 Hypertension   . Migraine   . Migraines   . Miscarriage 2009  . Neck pain   . Nondisplaced fracture of fifth metatarsal bone, left foot, subsequent encounter for fracture with routine healing 08/15/18 09/25/2018  . Pruritus 09/29/2018  . Pruritus ani 09/25/2018  . Rash and nonspecific skin eruption 05/14/2017  . Vaginitis    cyctitis    Past Surgical History:  Procedure Laterality Date  . cervical cryotherapy N/A 1999  . DILITATION & CURRETTAGE/HYSTROSCOPY WITH THERMACHOICE ABLATION  08/26/2013   Procedure: DILATATION & CURETTAGE/HYSTEROSCOPY WITH THERMACHOICE ENDOMETRIAL ABLATION Procedure #2 Total Therapy  Time=min       sec;  Surgeon: Jonnie Kind, MD;  Location: [facility];  Service: Gynecology;;  . LAPAROSCOPIC BILATERAL SALPINGECTOMY Bilateral 08/26/2013   Procedure: LAPAROSCOPIC BILATERAL SALPINGECTOMY AND REMOVAL OF LEFT PERITUBAL CYST Procedure #1;  Surgeon: Jonnie Kind, MD;  Location: [facility];  Service: Gynecology;  Laterality: Bilateral;  . LAPAROSCOPIC LYSIS OF ADHESIONS  08/26/2013   Procedure: LAPAROSCOPIC LYSIS OF ADHESIONS Procedure #1;  Surgeon: Jonnie Kind, MD;  Location: [facility];  Service: Gynecology;;  . Carmelia Bake LESION REMOVAL  08/26/2013   Procedure: REMOVAL OF VULVAR SEBACEOUS CYST Procedure #3;  Surgeon: Jonnie Kind, MD;  Location: [facility];  Service: Gynecology;;    There were no vitals filed for this visit.   Subjective Assessment - 11/21/19 1602    Subjective Patient reported 7/10 pain in her mid back right now.    Pertinent History HTN, migraines    Limitations Sitting;Standing;Walking    How long can you sit comfortably? 15 minutes    How long can you stand comfortably? 30 minutes    How long can you walk comfortably? 30 minutes    Diagnostic tests 6/16 xrays: thoracic and lumbar spine (-), cervical spine with mild reveral cervical lordosis with mild C5-C6 degenerative changes    Patient Stated Goals to eliminate the pain as much as possible  Currently in Pain? Yes    Pain Score 7     Pain Location Back    Pain Orientation Mid    Pain Descriptors / Indicators Aching    Pain Type Chronic pain    Pain Onset More than a month ago              [facility] PT Assessment - 11/21/19 0001      Palpation   Palpation comment Mild hypomobility of ribs                          [facility] Adult PT Treatment/Exercise - 11/21/19 0001      Exercises   Exercises Lumbar      Lumbar Exercises: Stretches   Other Lumbar Stretch Exercise Thoracic book openers 10x10'' each side    Other Lumbar Stretch Exercise Child's pose 3x30''       Lumbar Exercises:  Seated   Other Seated Lumbar Exercises Thoracic excursions x10 each      Lumbar Exercises: Quadruped   Other Quadruped Lumbar Exercises Cat/cow x10      Manual Therapy   Manual Therapy Soft tissue mobilization    Manual therapy comments all manual completed separately from other skilled interventions    Soft tissue mobilization STM to patient's middle traps, rhomboids and bilteral cervical paraspinals                    PT Short Term Goals - 11/21/19 1603      PT SHORT TERM GOAL #1   Title Pt will self report 6/10 pain at worst on average during household chores to demo improved strength with functional mobility.    Time 3    Period Weeks    Status On-going    Target Date 12/04/19      PT SHORT TERM GOAL #2   Title Pt will verbalize compliance and understanding of HEP and perform consistently to improve AROM, strength, and reduce pain with mobility.    Time 3    Period Weeks    Status On-going             PT Long Term Goals - 11/21/19 1604      PT LONG TERM GOAL #1   Title Pt will self report 4/10 pain at worst on average during household chores to demo improved strength with functional mobility.    Time 6    Period Weeks    Status On-going      PT LONG TERM GOAL #2   Title Pt will improve FOTO by 10 points to indicate improvement in ability to perform functional tasks with reduced pain.    Time 6    Period Weeks    Status On-going      PT LONG TERM GOAL #3   Title Pt will improve thoracic spine mobility to WNL to improve posture, standing/sitting tolerance, and begin home exercise program.    Time 6    Period Weeks    Status On-going                 Plan - 11/21/19 1706    Clinical Impression Statement Began by reviewing patient's goals and patient's HEP. Patient demonstrated good form with exercises for HEP with minimal cueing. Educated patient on thoracic mobility exercises including thoracic excursions and thoracic book openers as well as  child's pose. Ended with STM for pain relief. Patient reported an improvement in pain symptoms following this.  Personal Factors and Comorbidities Time since onset of injury/illness/exacerbation    Examination-Activity Limitations Sit    Examination-Participation Restrictions Cleaning;Driving    Stability/Clinical Decision Making Evolving/Moderate complexity    Rehab Potential Good    PT Frequency 2x / week    PT Duration 6 weeks    PT Treatment/Interventions ADLs/Self Care Home Management;Aquatic Therapy;Cryotherapy;Moist Heat;Traction;Gait training;Stair training;Functional mobility training;Therapeutic activities;Therapeutic exercise;Balance training;Neuromuscular re-education;Patient/family education;Orthotic Fit/Training;Manual techniques;Passive range of motion;Dry needling;Joint Manipulations    PT Next Visit Plan stretches to improve AROM throughout spine (rotation), core activation, posture strengthening. Use manual as needed for pain control, to improve spinal mobility.    PT Home Exercise Plan Eval: thoracic rotation, cat-cow quadruped    Consulted and Agree with Plan of Care Patient           Patient will benefit from skilled therapeutic intervention in order to improve the following deficits and impairments:  Cardiopulmonary status limiting activity, Decreased activity tolerance, Decreased range of motion, Decreased strength, Hypomobility, Increased muscle spasms, Pain  Visit Diagnosis: Pain in thoracic spine  Other symptoms and signs involving the musculoskeletal system     Problem List Patient Active Problem List   Diagnosis Date Noted  . 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 Asthma 10/29/2019  . Lipid screening 09/10/2019  . Frequent nosebleeds 07/11/2019  . Hypokalemia 07/11/2019  . Accelerated hypertension 07/11/2019  . Family history of lung cancer   . Breast pain, left 02/05/2019  . Major depression with psychotic features ([place]) 12/02/2017  . Chronic  migraine 12/06/2016  . Vulvovaginitis 08/10/2016  . Genetic testing 05/10/2016  . Obesity (BMI 30.0-34.9) 03/17/2015  . Essential hypertension, benign 01/07/2014  . Low back pain with radiation 07/30/2013  . Frequent urination 05/18/2011  . Vitamin D deficiency 10/27/2010  . Anxiety 10/26/2010  . Neck pain 07/04/2007   Clarene Critchley PT, DPT 5:07 PM, 11/21/19 [place], [place], [address] Phone: [phone]   Fax:  [phone]  Name: Rebecca Boyd MRN: [id number] Date of Birth: 08/12/1975

## 2019-11-24 ENCOUNTER — Ambulatory Visit: Payer: 59 | Admitting: Family Medicine

## 2019-11-25 ENCOUNTER — Encounter (HOSPITAL_COMMUNITY): Payer: 59 | Admitting: Physical Therapy

## 2019-11-26 ENCOUNTER — Telehealth (HOSPITAL_COMMUNITY): Payer: Self-pay | Admitting: Physical Therapy

## 2019-11-26 NOTE — Telephone Encounter (Signed)
Patient no show for appointment on 7/27. Patient agreeable to switch appointment time on 11/27/19 due to PT/PTA scheduling conflict.  8:26 AM, 11/26/19 Mearl Latin PT, DPT Physical Therapist at [facility]

## 2019-11-27 ENCOUNTER — Encounter (HOSPITAL_COMMUNITY): Payer: Self-pay

## 2019-11-27 ENCOUNTER — Ambulatory Visit (HOSPITAL_COMMUNITY): Payer: 59

## 2019-11-27 ENCOUNTER — Other Ambulatory Visit: Payer: Self-pay

## 2019-11-27 DIAGNOSIS — M546 Pain in thoracic spine: Secondary | ICD-10-CM | POA: Diagnosis not present

## 2019-11-27 DIAGNOSIS — R29898 Other symptoms and signs involving the musculoskeletal system: Secondary | ICD-10-CM

## 2019-11-27 NOTE — Therapy (Signed)
[place], [place], [address] Phone: [phone]   Fax:  [phone]  Physical Therapy Treatment  Patient Details  Name: Rebecca Boyd MRN: [id number] Date of Birth: Dec 03, 1975 Referring Provider (PT): Fayrene Helper,  MD   Encounter Date: 11/27/2019   PT End of Session - 11/27/19 1614    Visit Number 3    Number of Visits 12    Date for PT Re-Evaluation 12/25/19    Authorization Type [employer] (30 VL, no auth)    Authorization Time Period 11/13/19 to 12/25/19    PT Start Time 1535    PT Stop Time 1614    PT Time Calculation (min) 39 min    Activity Tolerance Patient tolerated treatment well    Behavior During Therapy [facility] for tasks assessed/performed           Past Medical History:  Diagnosis Date  . Anemia   . Anxiety   . Bacterial vaginosis 07/27/2014  . Cervical neck pain with evidence of disc disease 10/02/2013  . Depressive disorder, not elsewhere classified   . Family history of breast cancer   . Family history of breast cancer in sister 09/25/2018  . Family history of colon cancer   . Family history of lung cancer   . FH: breast cancer in relative when <62 years old 06/10/2018   Dx at age 58  . Headache 03/16/2016  . 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 Hypertension   . Migraine   . Migraines   . Miscarriage 2009  . Neck pain   . Nondisplaced fracture of fifth metatarsal bone, left foot, subsequent encounter for fracture with routine healing 08/15/18 09/25/2018  . Pruritus 09/29/2018  . Pruritus ani 09/25/2018  . Rash and nonspecific skin eruption 05/14/2017  . Vaginitis    cyctitis    Past Surgical History:  Procedure Laterality Date  . cervical cryotherapy N/A 1999  . DILITATION & CURRETTAGE/HYSTROSCOPY WITH THERMACHOICE ABLATION  08/26/2013   Procedure: DILATATION & CURETTAGE/HYSTEROSCOPY WITH THERMACHOICE ENDOMETRIAL ABLATION Procedure #2 Total Therapy  Time=min       sec;  Surgeon: Jonnie Kind, MD;  Location: [facility];  Service: Gynecology;;  . LAPAROSCOPIC BILATERAL SALPINGECTOMY Bilateral 08/26/2013   Procedure: LAPAROSCOPIC BILATERAL SALPINGECTOMY AND REMOVAL OF LEFT PERITUBAL CYST Procedure #1;  Surgeon: Jonnie Kind, MD;  Location: [facility];  Service: Gynecology;  Laterality: Bilateral;  . LAPAROSCOPIC LYSIS OF ADHESIONS  08/26/2013   Procedure: LAPAROSCOPIC LYSIS OF ADHESIONS Procedure #1;  Surgeon: Jonnie Kind, MD;  Location: [facility];  Service: Gynecology;;  . Carmelia Bake LESION REMOVAL  08/26/2013   Procedure: REMOVAL OF VULVAR SEBACEOUS CYST Procedure #3;  Surgeon: Jonnie Kind, MD;  Location: [facility];  Service: Gynecology;;    There were no vitals filed for this visit.   Subjective Assessment - 11/27/19 1538    Subjective Pt stated mid back is feeling better, current pain scale 5/10 constant achey pain.  Reports complaince with HEP at least 3 days a week    Pertinent History HTN, migraines    Patient Stated Goals to eliminate the pain as much as possible    Currently in Pain? Yes    Pain Score 5     Pain Location Back    Pain Orientation Mid    Pain Descriptors / Indicators Aching    Pain Type Chronic pain    Pain Radiating Towards none  Pain Onset More than a month ago    Pain Frequency Constant    Aggravating Factors  prolonged positions    Pain Relieving Factors laying down, medication, stretching    Effect of Pain on Daily Activities pushes through the pain.                             [place] Adult PT Treatment/Exercise - 11/27/19 0001      Exercises   Exercises Lumbar      Lumbar Exercises: Stretches   Other Lumbar Stretch Exercise Thoracic book openers 10x10'' each side    Other Lumbar Stretch Exercise Child's pose 3x30''       Lumbar Exercises: Standing   Row 10 reps;Theraband    Theraband Level (Row) Level 2 (Red)    Shoulder Extension 10 reps;Theraband    Theraband Level (Shoulder  Extension) Level 2 (Red)      Lumbar Exercises: Seated   Other Seated Lumbar Exercises 3D Thoracic excursions x10 each    Other Seated Lumbar Exercises Seated posture; scapular and cerrvical retraction      Lumbar Exercises: Quadruped   Other Quadruped Lumbar Exercises Cat/cow x10 HEP      Manual Therapy   Manual Therapy Soft tissue mobilization    Manual therapy comments all manual completed separately from other skilled interventions    Soft tissue mobilization prone position: STM to paraspinals, mid traps and rhomboids                    PT Short Term Goals - 11/21/19 1603      PT SHORT TERM GOAL #1   Title Pt will self report 6/10 pain at worst on average during household chores to demo improved strength with functional mobility.    Time 3    Period Weeks    Status On-going    Target Date 12/04/19      PT SHORT TERM GOAL #2   Title Pt will verbalize compliance and understanding of HEP and perform consistently to improve AROM, strength, and reduce pain with mobility.    Time 3    Period Weeks    Status On-going             PT Long Term Goals - 11/21/19 1604      PT LONG TERM GOAL #1   Title Pt will self report 4/10 pain at worst on average during household chores to demo improved strength with functional mobility.    Time 6    Period Weeks    Status On-going      PT LONG TERM GOAL #2   Title Pt will improve FOTO by 10 points to indicate improvement in ability to perform functional tasks with reduced pain.    Time 6    Period Weeks    Status On-going      PT LONG TERM GOAL #3   Title Pt will improve thoracic spine mobility to WNL to improve posture, standing/sitting tolerance, and begin home exercise program.    Time 6    Period Weeks    Status On-going                 Plan - 11/27/19 1614    Clinical Impression Statement Continued spinal mobility exercises with min cueing for form and mechanics.  Discussed importance of proper posture in  seated position to reduce strain of spinal mm.  Addded postural strengthening exercises to POC  with good form following initial instructions, cueing to engage core, reduce forward head and knees knocked for postural strengthening.  EOS with STM to address musculature restriction in mid thoracic region with reports of relief following.  Pain reduced to 3/10 at EOS.    Personal Factors and Comorbidities Time since onset of injury/illness/exacerbation    Examination-Activity Limitations Sit    Examination-Participation Restrictions Cleaning;Driving    Stability/Clinical Decision Making Evolving/Moderate complexity    Clinical Decision Making Moderate    Rehab Potential Good    PT Frequency 2x / week    PT Duration 6 weeks    PT Treatment/Interventions ADLs/Self Care Home Management;Aquatic Therapy;Cryotherapy;Moist Heat;Traction;Gait training;Stair training;Functional mobility training;Therapeutic activities;Therapeutic exercise;Balance training;Neuromuscular re-education;Patient/family education;Orthotic Fit/Training;Manual techniques;Passive range of motion;Dry needling;Joint Manipulations    PT Next Visit Plan Show pt self massage with tennis balls for self care next session.  stretches to improve AROM throughout spine (rotation), core activation, posture strengthening. Use manual as needed for pain control, to improve spinal mobility.    PT Home Exercise Plan Eval: thoracic rotation, cat-cow quadruped           Patient will benefit from skilled therapeutic intervention in order to improve the following deficits and impairments:  Cardiopulmonary status limiting activity, Decreased activity tolerance, Decreased range of motion, Decreased strength, Hypomobility, Increased muscle spasms, Pain  Visit Diagnosis: Pain in thoracic spine  Other symptoms and signs involving the musculoskeletal system     Problem List Patient Active Problem List   Diagnosis Date Noted  . Chronic thoracic spine  pain 10/29/2019  . Asthma 10/29/2019  . Lipid screening 09/10/2019  . Frequent nosebleeds 07/11/2019  . Hypokalemia 07/11/2019  . Accelerated hypertension 07/11/2019  . Family history of lung cancer   . Breast pain, left 02/05/2019  . Major depression with psychotic features ([place]) 12/02/2017  . Chronic migraine 12/06/2016  . Vulvovaginitis 08/10/2016  . Genetic testing 05/10/2016  . Obesity (BMI 30.0-34.9) 03/17/2015  . Essential hypertension, benign 01/07/2014  . Low back pain with radiation 07/30/2013  . Frequent urination 05/18/2011  . Vitamin D deficiency 10/27/2010  . Anxiety 10/26/2010  . Neck pain 07/04/2007   Ihor Austin, LPTA/CLT; CBIS [phone]  Aldona Lento 11/27/2019, 6:24 PM  [facility] [address] [place], [place], [address] Phone: [phone]   Fax:  [phone]  Name: LAWONDA PRETLOW MRN: [id number] Date of Birth: 03-02-1976

## 2019-12-02 ENCOUNTER — Other Ambulatory Visit: Payer: Self-pay

## 2019-12-02 ENCOUNTER — Encounter (HOSPITAL_COMMUNITY): Payer: Self-pay

## 2019-12-02 ENCOUNTER — Ambulatory Visit (HOSPITAL_COMMUNITY): Payer: 59 | Attending: Family Medicine

## 2019-12-02 ENCOUNTER — Other Ambulatory Visit: Payer: Self-pay | Admitting: Family Medicine

## 2019-12-02 DIAGNOSIS — M546 Pain in thoracic spine: Secondary | ICD-10-CM | POA: Insufficient documentation

## 2019-12-02 DIAGNOSIS — R29898 Other symptoms and signs involving the musculoskeletal system: Secondary | ICD-10-CM | POA: Insufficient documentation

## 2019-12-02 NOTE — Therapy (Signed)
[place], [place], [address] Phone: [phone]   Fax:  [phone]  Physical Therapy Treatment  Patient Details  Name: Rebecca Boyd MRN: [id number] Date of Birth: 04/17/1976 Referring Provider (PT): Fayrene Helper,  MD   Encounter Date: 12/02/2019   PT End of Session - 12/02/19 1713    Visit Number 4    Number of Visits 12    Date for PT Re-Evaluation 12/25/19    Authorization Type [employer] (30 VL, no auth)    Authorization Time Period 11/13/19 to 12/25/19    PT Start Time 1705    PT Stop Time 1744    PT Time Calculation (min) 39 min    Activity Tolerance Patient tolerated treatment well    Behavior During Therapy [facility] for tasks assessed/performed           Past Medical History:  Diagnosis Date  . Anemia   . Anxiety   . Bacterial vaginosis 07/27/2014  . Cervical neck pain with evidence of disc disease 10/02/2013  . Depressive disorder, not elsewhere classified   . Family history of breast cancer   . Family history of breast cancer in sister 09/25/2018  . Family history of colon cancer   . Family history of lung cancer   . FH: breast cancer in relative when <101 years old 06/10/2018   Dx at age 34  . Headache 03/16/2016  . 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 Hypertension   . Migraine   . Migraines   . Miscarriage 2009  . Neck pain   . Nondisplaced fracture of fifth metatarsal bone, left foot, subsequent encounter for fracture with routine healing 08/15/18 09/25/2018  . Pruritus 09/29/2018  . Pruritus ani 09/25/2018  . Rash and nonspecific skin eruption 05/14/2017  . Vaginitis    cyctitis    Past Surgical History:  Procedure Laterality Date  . cervical cryotherapy N/A 1999  . DILITATION & CURRETTAGE/HYSTROSCOPY WITH THERMACHOICE ABLATION  08/26/2013   Procedure: DILATATION & CURETTAGE/HYSTEROSCOPY WITH THERMACHOICE ENDOMETRIAL ABLATION Procedure #2 Total Therapy Time=min        sec;  Surgeon: Jonnie Kind, MD;  Location: [facility];  Service: Gynecology;;  . LAPAROSCOPIC BILATERAL SALPINGECTOMY Bilateral 08/26/2013   Procedure: LAPAROSCOPIC BILATERAL SALPINGECTOMY AND REMOVAL OF LEFT PERITUBAL CYST Procedure #1;  Surgeon: Jonnie Kind, MD;  Location: [facility];  Service: Gynecology;  Laterality: Bilateral;  . LAPAROSCOPIC LYSIS OF ADHESIONS  08/26/2013   Procedure: LAPAROSCOPIC LYSIS OF ADHESIONS Procedure #1;  Surgeon: Jonnie Kind, MD;  Location: [facility];  Service: Gynecology;;  . Carmelia Bake LESION REMOVAL  08/26/2013   Procedure: REMOVAL OF VULVAR SEBACEOUS CYST Procedure #3;  Surgeon: Jonnie Kind, MD;  Location: [facility];  Service: Gynecology;;    There were no vitals filed for this visit.   Subjective Assessment - 12/02/19 1710    Subjective Pt stated she has periodic spasms from spine to left side of lower back, pain scale 6/10.    Patient Stated Goals to eliminate the pain as much as possible    Currently in Pain? Yes    Pain Score 6     Pain Location Back    Pain Orientation Mid;Lower;Left    Pain Descriptors / Indicators Aching;Spasm    Pain Type Chronic pain    Pain Radiating Towards none    Pain Onset More than a month ago    Pain Frequency Constant  Aggravating Factors  prolonged positions    Pain Relieving Factors laying down, medication, stretching    Effect of Pain on Daily Activities pushes through the pain                             [facility] Adult PT Treatment/Exercise - 12/02/19 0001      Exercises   Exercises Lumbar      Lumbar Exercises: Stretches   Other Lumbar Stretch Exercise Child's pose 3x30''       Lumbar Exercises: Standing   Row 10 reps;Theraband    Theraband Level (Row) Level 3 (Green)    Shoulder Extension 10 reps;Theraband    Theraband Level (Shoulder Extension) Level 2 (Red)      Lumbar Exercises: Supine   Other Supine Lumbar Exercises Instructed self massage with 2 tennis balls       Lumbar  Exercises: Quadruped   Other Quadruped Lumbar Exercises Thoracic rotation 10x      Manual Therapy   Manual Therapy Soft tissue mobilization    Manual therapy comments all manual completed separately from other skilled interventions    Soft tissue mobilization prone position: STM to paraspinals, mid traps and rhomboids                    PT Short Term Goals - 11/21/19 1603      PT SHORT TERM GOAL #1   Title Pt will self report 6/10 pain at worst on average during household chores to demo improved strength with functional mobility.    Time 3    Period Weeks    Status On-going    Target Date 12/04/19      PT SHORT TERM GOAL #2   Title Pt will verbalize compliance and understanding of HEP and perform consistently to improve AROM, strength, and reduce pain with mobility.    Time 3    Period Weeks    Status On-going             PT Long Term Goals - 11/21/19 1604      PT LONG TERM GOAL #1   Title Pt will self report 4/10 pain at worst on average during household chores to demo improved strength with functional mobility.    Time 6    Period Weeks    Status On-going      PT LONG TERM GOAL #2   Title Pt will improve FOTO by 10 points to indicate improvement in ability to perform functional tasks with reduced pain.    Time 6    Period Weeks    Status On-going      PT LONG TERM GOAL #3   Title Pt will improve thoracic spine mobility to WNL to improve posture, standing/sitting tolerance, and begin home exercise program.    Time 6    Period Weeks    Status On-going                 Plan - 12/02/19 1834    Clinical Impression Statement Instructed self massage with tennis balls.  Continues spinal mobility and postural strengthening, pt able to complete wiht no reoprts of increased pain through session.  EOS with STM to address spasms in thoracic region for pain relief.  No reports of pain thorugh session.    Personal Factors and Comorbidities Time since onset of  injury/illness/exacerbation    Examination-Activity Limitations Sit    Examination-Participation Restrictions Cleaning;Driving  Stability/Clinical Decision Making Evolving/Moderate complexity    Clinical Decision Making Moderate    Rehab Potential Good    PT Frequency 2x / week    PT Duration 6 weeks    PT Treatment/Interventions ADLs/Self Care Home Management;Aquatic Therapy;Cryotherapy;Moist Heat;Traction;Gait training;Stair training;Functional mobility training;Therapeutic activities;Therapeutic exercise;Balance training;Neuromuscular re-education;Patient/family education;Orthotic Fit/Training;Manual techniques;Passive range of motion;Dry needling;Joint Manipulations    PT Next Visit Plan Continue stretches to improve AROM throughout spine (rotation), core activation, posture strengthening. Use manual as needed for pain control, to improve spinal mobility.    PT Home Exercise Plan Eval: thoracic rotation, cat-cow quadruped           Patient will benefit from skilled therapeutic intervention in order to improve the following deficits and impairments:  Cardiopulmonary status limiting activity, Decreased activity tolerance, Decreased range of motion, Decreased strength, Hypomobility, Increased muscle spasms, Pain  Visit Diagnosis: Other symptoms and signs involving the musculoskeletal system  Pain in thoracic spine     Problem List Patient Active Problem List   Diagnosis Date Noted  . 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 Asthma 10/29/2019  . Lipid screening 09/10/2019  . Frequent nosebleeds 07/11/2019  . Hypokalemia 07/11/2019  . Accelerated hypertension 07/11/2019  . Family history of lung cancer   . Breast pain, left 02/05/2019  . Major depression with psychotic features ([place]) 12/02/2017  . Chronic migraine 12/06/2016  . Vulvovaginitis 08/10/2016  . Genetic testing 05/10/2016  . Obesity (BMI 30.0-34.9) 03/17/2015  . Essential hypertension, benign 01/07/2014  . Low  back pain with radiation 07/30/2013  . Frequent urination 05/18/2011  . Vitamin D deficiency 10/27/2010  . Anxiety 10/26/2010  . Neck pain 07/04/2007   Ihor Austin, LPTA/CLT; CBIS [phone]  Aldona Lento 12/02/2019, 6:38 PM  [facility] [address] [place], [place], [address] Phone: [phone]   Fax:  [phone]  Name: Rebecca Boyd MRN: [id number] Date of Birth: 04/15/1976

## 2019-12-03 MED ORDER — METRONIDAZOLE 0.75 % VA GEL: 1.0000 | Freq: Two times a day (BID) | VAGINAL | 0 refills | Status: DC

## 2019-12-04 ENCOUNTER — Other Ambulatory Visit: Payer: Self-pay

## 2019-12-04 ENCOUNTER — Encounter (HOSPITAL_COMMUNITY): Payer: Self-pay

## 2019-12-04 ENCOUNTER — Ambulatory Visit (HOSPITAL_COMMUNITY): Payer: 59

## 2019-12-04 DIAGNOSIS — R29898 Other symptoms and signs involving the musculoskeletal system: Secondary | ICD-10-CM | POA: Diagnosis not present

## 2019-12-04 DIAGNOSIS — M546 Pain in thoracic spine: Secondary | ICD-10-CM

## 2019-12-04 NOTE — Therapy (Signed)
[facility], [place], [address] Phone: [phone]   Fax:  [phone]  Physical Therapy Treatment  Patient Details  Name: Rebecca Boyd MRN: [id number] Date of Birth: 12-24-1975 Referring Provider (PT): Fayrene Helper,  MD   Encounter Date: 12/04/2019   PT End of Session - 12/04/19 1702    Visit Number 5    Number of Visits 12    Date for PT Re-Evaluation 12/25/19    Authorization Type [employer] (30 VL, no auth)    Authorization Time Period 11/13/19 to 12/25/19    PT Start Time 1657    PT Stop Time 1743    PT Time Calculation (min) 46 min    Activity Tolerance Patient tolerated treatment well    Behavior During Therapy [facility] for tasks assessed/performed           Past Medical History:  Diagnosis Date  . Anemia   . Anxiety   . Bacterial vaginosis 07/27/2014  . Cervical neck pain with evidence of disc disease 10/02/2013  . Depressive disorder, not elsewhere classified   . Family history of breast cancer   . Family history of breast cancer in sister 09/25/2018  . Family history of colon cancer   . Family history of lung cancer   . FH: breast cancer in relative when <54 years old 06/10/2018   Dx at age 85  . Headache 03/16/2016  . 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 Hypertension   . Migraine   . Migraines   . Miscarriage 2009  . Neck pain   . Nondisplaced fracture of fifth metatarsal bone, left foot, subsequent encounter for fracture with routine healing 08/15/18 09/25/2018  . Pruritus 09/29/2018  . Pruritus ani 09/25/2018  . Rash and nonspecific skin eruption 05/14/2017  . Vaginitis    cyctitis    Past Surgical History:  Procedure Laterality Date  . cervical cryotherapy N/A 1999  . DILITATION & CURRETTAGE/HYSTROSCOPY WITH THERMACHOICE ABLATION  08/26/2013   Procedure: DILATATION & CURETTAGE/HYSTEROSCOPY WITH THERMACHOICE ENDOMETRIAL ABLATION Procedure #2 Total Therapy Time=min        sec;  Surgeon: Jonnie Kind, MD;  Location: [facility];  Service: Gynecology;;  . LAPAROSCOPIC BILATERAL SALPINGECTOMY Bilateral 08/26/2013   Procedure: LAPAROSCOPIC BILATERAL SALPINGECTOMY AND REMOVAL OF LEFT PERITUBAL CYST Procedure #1;  Surgeon: Jonnie Kind, MD;  Location: [facility];  Service: Gynecology;  Laterality: Bilateral;  . LAPAROSCOPIC LYSIS OF ADHESIONS  08/26/2013   Procedure: LAPAROSCOPIC LYSIS OF ADHESIONS Procedure #1;  Surgeon: Jonnie Kind, MD;  Location: [facility];  Service: Gynecology;;  . Carmelia Bake LESION REMOVAL  08/26/2013   Procedure: REMOVAL OF VULVAR SEBACEOUS CYST Procedure #3;  Surgeon: Jonnie Kind, MD;  Location: [facility];  Service: Gynecology;;    There were no vitals filed for this visit.   Subjective Assessment - 12/04/19 1658    Subjective Pt reports she had periodic spasms throughout the night last night.  Pain scale 6/10 Lt>Rt constant achey pain.  Reports a headache following  last session, has been hydrated today.  Reports no spasms for a day following last session.    Pertinent History HTN, migraines    Patient Stated Goals to eliminate the pain as much as possible    Currently in Pain? Yes    Pain Score 6     Pain Location Back    Pain Orientation Mid;Left;Right    Pain Descriptors / Indicators  Aching;Spasm    Pain Type Chronic pain    Pain Radiating Towards none    Pain Onset More than a month ago    Pain Frequency Constant    Aggravating Factors  prolonged positions    Pain Relieving Factors laying down, medication, stretching    Effect of Pain on Daily Activities pushes through the pain                             [facility] Adult PT Treatment/Exercise - 12/04/19 0001      Exercises   Exercises Lumbar      Lumbar Exercises: Stretches   Other Lumbar Stretch Exercise Child's pose 3x30'' (neutral, Rt and Lt)      Lumbar Exercises: Seated   Other Seated Lumbar Exercises 3D Thoracic excursions x10 each      Lumbar  Exercises: Quadruped   Other Quadruped Lumbar Exercises Cat/cow x10 10" holds    Other Quadruped Lumbar Exercises Thoracic rotation 10x      Manual Therapy   Manual Therapy Soft tissue mobilization    Manual therapy comments all manual completed separately from other skilled interventions    Soft tissue mobilization prone position: STM to paraspinals, mid traps and rhomboids. used manual as well as IASTM                    PT Short Term Goals - 11/21/19 1603      PT SHORT TERM GOAL #1   Title Pt will self report 6/10 pain at worst on average during household chores to demo improved strength with functional mobility.    Time 3    Period Weeks    Status On-going    Target Date 12/04/19      PT SHORT TERM GOAL #2   Title Pt will verbalize compliance and understanding of HEP and perform consistently to improve AROM, strength, and reduce pain with mobility.    Time 3    Period Weeks    Status On-going             PT Long Term Goals - 11/21/19 1604      PT LONG TERM GOAL #1   Title Pt will self report 4/10 pain at worst on average during household chores to demo improved strength with functional mobility.    Time 6    Period Weeks    Status On-going      PT LONG TERM GOAL #2   Title Pt will improve FOTO by 10 points to indicate improvement in ability to perform functional tasks with reduced pain.    Time 6    Period Weeks    Status On-going      PT LONG TERM GOAL #3   Title Pt will improve thoracic spine mobility to WNL to improve posture, standing/sitting tolerance, and begin home exercise program.    Time 6    Period Weeks    Status On-going                 Plan - 12/04/19 1717    Clinical Impression Statement Session focus with spinal mobility and stretches for muscle lengthening to address spasms.  EOS with STM to thoracic region for pain control, able to reduce though unable to fully resolve rhomboid spasm, will benefit from increased time manual  next session and possible joint mobs.  EOS pt reoprts she feels great, no reports of pain.  Discussed benefits of  massage and encouraged pt to begin regular massage at least monthly to assist wiht pain control management.    Personal Factors and Comorbidities Time since onset of injury/illness/exacerbation;Profession    Examination-Activity Limitations Sit    Examination-Participation Restrictions Cleaning;Driving    Stability/Clinical Decision Making Evolving/Moderate complexity    Clinical Decision Making Moderate    Rehab Potential Good    PT Frequency 2x / week    PT Duration 6 weeks    PT Treatment/Interventions ADLs/Self Care Home Management;Aquatic Therapy;Cryotherapy;Moist Heat;Traction;Gait training;Stair training;Functional mobility training;Therapeutic activities;Therapeutic exercise;Balance training;Neuromuscular re-education;Patient/family education;Orthotic Fit/Training;Manual techniques;Passive range of motion;Dry needling;Joint Manipulations    PT Next Visit Plan Continue stretches to improve AROM throughout spine (rotation), core activation, posture strengthening. Use manual as needed for pain control, to improve spinal mobility.    PT Home Exercise Plan Eval: thoracic rotation, cat-cow quadruped           Patient will benefit from skilled therapeutic intervention in order to improve the following deficits and impairments:  Cardiopulmonary status limiting activity, Decreased activity tolerance, Decreased range of motion, Decreased strength, Hypomobility, Increased muscle spasms, Pain  Visit Diagnosis: Other symptoms and signs involving the musculoskeletal system  Pain in thoracic spine     Problem List Patient Active Problem List   Diagnosis Date Noted  . 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 Asthma 10/29/2019  . Lipid screening 09/10/2019  . Frequent nosebleeds 07/11/2019  . Hypokalemia 07/11/2019  . Accelerated hypertension 07/11/2019  . Family history of  lung cancer   . Breast pain, left 02/05/2019  . Major depression with psychotic features ([place]) 12/02/2017  . Chronic migraine 12/06/2016  . Vulvovaginitis 08/10/2016  . Genetic testing 05/10/2016  . Obesity (BMI 30.0-34.9) 03/17/2015  . Essential hypertension, benign 01/07/2014  . Low back pain with radiation 07/30/2013  . Frequent urination 05/18/2011  . Vitamin D deficiency 10/27/2010  . Anxiety 10/26/2010  . Neck pain 07/04/2007   Ihor Austin, LPTA/CLT; CBIS [phone]  Aldona Lento 12/04/2019, 6:46 PM  [facility] [address] [place], [place], [address] Phone: [phone]   Fax:  [phone]  Name: Rebecca Boyd MRN: [id number] Date of Birth: 1975/11/07

## 2019-12-09 ENCOUNTER — Other Ambulatory Visit: Payer: Self-pay

## 2019-12-09 ENCOUNTER — Ambulatory Visit (HOSPITAL_COMMUNITY): Payer: 59 | Admitting: Physical Therapy

## 2019-12-09 ENCOUNTER — Encounter (HOSPITAL_COMMUNITY): Payer: Self-pay | Admitting: Physical Therapy

## 2019-12-09 DIAGNOSIS — M546 Pain in thoracic spine: Secondary | ICD-10-CM

## 2019-12-09 DIAGNOSIS — R29898 Other symptoms and signs involving the musculoskeletal system: Secondary | ICD-10-CM | POA: Diagnosis not present

## 2019-12-09 NOTE — Patient Instructions (Signed)
Access Code: [email or web address] URL: [email or web address] Date: 12/09/2019 Prepared by: Josue Hector  Exercises Standing Shoulder Posterior Capsule Stretch - 2 x daily - 7 x weekly - 1 sets - 3 reps - 30 hold Seated Scapular Retraction - 2 x daily - 7 x weekly - 2 sets - 10 reps - 5 hold Seated Cervical Retraction - 2 x daily - 7 x weekly - 2 sets - 10 reps - 5 hold

## 2019-12-09 NOTE — Progress Notes (Signed)
Virtual Visit via Video Note  I connected with Rebecca Boyd on 12/12/19 at 11:10 AM EDT by a video enabled telemedicine application and verified that I am speaking with the correct person using two identifiers.   I discussed the limitations of evaluation and management by telemedicine and the availability of in person appointments. The patient expressed understanding and agreed to proceed.    I discussed the assessment and treatment plan with the patient. The patient was provided an opportunity to ask questions and all were answered. The patient agreed with the plan and demonstrated an understanding of the instructions.   The patient was advised to call back or seek an in-person evaluation if the symptoms worsen or if the condition fails to improve as anticipated.  Location: patient- home, provider- home office   I provided 12 minutes of non-face-to-face time during this encounter.   [name], MD    [facility] MD/PA/NP OP Progress Note  12/12/2019 11:27 AM Rebecca Boyd  MRN:  [id number]  Chief Complaint:  Chief Complaint    Depression; Follow-up; Anxiety     HPI:  This is a follow-up appointment for depression and anxiety.  She states that things are smooth.  When she is asked about her mood, she states that it comes and goes.  She wakes up in a good mood on some days, and feels irritable on other days.  She cannot think of any triggers.  She has been stressed lately as her sister had breast reconstruction surgery.  She helps her for bathing and preparing food.  Her mother is doing well.  She noticed that she has gained weight over the past few months.  She is willing to try switching to other medication to see if it helps for her weight.  She denies insomnia.  She feels fatigued.  She has mild anhedonia.  She has fair concentration.  She denies SI.  She feels anxious and tense at times.  She denies panic attacks.    Wt Readings from Last 3 Encounters:  12/10/19 179 lb 0.6 oz  (81.2 kg)  10/28/19 175 lb (79.4 kg)  09/25/19 173 lb (78.5 kg)    Daily routine: watch TV,  Exercise: takes a walk,  everyday Employment: Glass blower/designer for five years, currently shut down due to pandemic Household: mother, sister,  Marital status: single Number of children: 0  Visit Diagnosis:    ICD-10-CM   1. GAD (generalized anxiety disorder)  F41.1   2. MDD (major depressive disorder), recurrent episode, mild ([place])  F33.0     Past Psychiatric History: Please see initial evaluation for full details. I have reviewed the history. No updates at this time.     Past Medical History:  Past Medical History:  Diagnosis Date  . Anemia   . Anxiety   . Bacterial vaginosis 07/27/2014  . Cervical neck pain with evidence of disc disease 10/02/2013  . Depressive disorder, not elsewhere classified   . Family history of breast cancer   . Family history of breast cancer in sister 09/25/2018  . Family history of colon cancer   . Family history of lung cancer   . FH: breast cancer in relative when <15 years old 06/10/2018   Dx at age 6  . Headache 03/16/2016  . 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 Hypertension   . Migraine   . Migraines   . Miscarriage 2009  . Neck pain   . Nondisplaced fracture of  fifth metatarsal bone, left foot, subsequent encounter for fracture with routine healing 08/15/18 09/25/2018  . Pruritus 09/29/2018  . Pruritus ani 09/25/2018  . Rash and nonspecific skin eruption 05/14/2017  . Vaginitis    cyctitis    Past Surgical History:  Procedure Laterality Date  . cervical cryotherapy N/A 1999  . DILITATION & CURRETTAGE/HYSTROSCOPY WITH THERMACHOICE ABLATION  08/26/2013   Procedure: DILATATION & CURETTAGE/HYSTEROSCOPY WITH THERMACHOICE ENDOMETRIAL ABLATION Procedure #2 Total Therapy Time=min       sec;  Surgeon: Jonnie Kind, MD;  Location: [facility];  Service: Gynecology;;  . LAPAROSCOPIC BILATERAL SALPINGECTOMY Bilateral 08/26/2013   Procedure:  LAPAROSCOPIC BILATERAL SALPINGECTOMY AND REMOVAL OF LEFT PERITUBAL CYST Procedure #1;  Surgeon: Jonnie Kind, MD;  Location: [facility];  Service: Gynecology;  Laterality: Bilateral;  . LAPAROSCOPIC LYSIS OF ADHESIONS  08/26/2013   Procedure: LAPAROSCOPIC LYSIS OF ADHESIONS Procedure #1;  Surgeon: Jonnie Kind, MD;  Location: [facility];  Service: Gynecology;;  . Carmelia Bake LESION REMOVAL  08/26/2013   Procedure: REMOVAL OF VULVAR SEBACEOUS CYST Procedure #3;  Surgeon: Jonnie Kind, MD;  Location: [facility];  Service: Gynecology;;    Family Psychiatric History: Please see initial evaluation for full details. I have reviewed the history. No updates at this time.     Family History:  Family History  Problem Relation Age of Onset  . Drug abuse Mother   . Hypertension Father   . Breast cancer Sister        dx 28  . Hypertension Maternal Grandmother   . Hypertension Maternal Grandfather   . Hypertension Paternal Grandmother   . Breast cancer Paternal Grandmother        dx late 21s  . Colon cancer Cousin 41    Social History:  Social History   Socioeconomic History  . Marital status: Single    Spouse name: Not on file  . Number of children: Not on file  . Years of education: Not on file  . Highest education level: Not on file  Occupational History  . Not on file  Tobacco Use  . Smoking status: Former Smoker    Packs/day: 0.00    Types: Cigars    Quit date: 01/05/2017    Years since quitting: 2.9  . Smokeless tobacco: Never Used  Vaping Use  . Vaping Use: Never used  Substance and Sexual Activity  . Alcohol use: Yes    Comment: occasional  . Drug use: No  . Sexual activity: Yes    Birth control/protection: None  Other Topics Concern  . Not on file  Social History Narrative   Lives home with mother.  Works at [employer].  Education 10th grade/GED.  No children.  Single.     Social Determinants of Health   Financial Resource Strain:   . Difficulty of Paying Living  Expenses:   Food Insecurity:   . Worried About Charity fundraiser in the Last Year:   . Arboriculturist in the Last Year:   Transportation Needs:   . Film/video editor (Medical):   Marland Kitchen Lack of Transportation (Non-Medical):   Physical Activity:   . Days of Exercise per Week:   . Minutes of Exercise per Session:   Stress:   . Feeling of Stress :   Social Connections:   . Frequency of Communication with Friends and Family:   . Frequency of Social Gatherings with Friends and Family:   . Attends Religious Services:   .  Active Member of Clubs or Organizations:   . Attends Archivist Meetings:   Marland Kitchen Marital Status:     Allergies:  Allergies  Allergen Reactions  . Diphenhydramine Hcl Anaphylaxis    (Benadryl)  . Orange Fruit Hives, Shortness Of Breath, Itching and Other (See Comments)    MAKES IT DIFFICULT TO BREATH  . Latex Rash    Metabolic Disorder Labs: Lab Results  Component Value Date   HGBA1C 5.2 06/10/2018   MPG 103 06/10/2018   MPG 108 05/29/2013   No results found for: PROLACTIN Lab Results  Component Value Date   CHOL 191 06/10/2018   TRIG 174 (H) 06/10/2018   HDL 44 (L) 06/10/2018   CHOLHDL 4.3 06/10/2018   VLDL 19 05/10/2016   LDLCALC 118 (H) 06/10/2018   LDLCALC 117 (H) 05/10/2016   Lab Results  Component Value Date   TSH 1.67 06/10/2018   TSH 1.12 05/10/2016    Therapeutic Level Labs: No results found for: LITHIUM No results found for: VALPROATE No components found for:  CBMZ  Current Medications: Current Outpatient Medications  Medication Sig Dispense Refill  . albuterol (VENTOLIN HFA) 108 (90 Base) MCG/ACT inhaler Inhale 2 puffs into the lungs every 6 (six) hours as needed for wheezing or shortness of breath. 18 g 0  . amLODipine (NORVASC) 10 MG tablet Take 1 tablet (10 mg total) by mouth daily. 30 tablet 1  . cetirizine (ZYRTEC ALLERGY) 10 MG tablet Take 1 tablet (10 mg total) by mouth daily. 30 tablet 1  . Erenumab-aooe  (AIMOVIG) 70 MG/ML SOAJ Inject 70 mg into the skin every 30 (thirty) days. 1 pen 11  . escitalopram (LEXAPRO) 10 MG tablet 5 mg daily for one week, then 10 mg daily 30 tablet 1  . fluticasone furoate-vilanterol (BREO ELLIPTA) 100-25 MCG/INH AEPB Inhale 1 puff into the lungs in the morning and at bedtime. 60 each 5  . metroNIDAZOLE (METROGEL VAGINAL) 0.75 % vaginal gel Place 1 Applicatorful vaginally 2 (two) times daily. 70 g 0  . sertraline (ZOLOFT) 100 MG tablet Take 1 tablet (100 mg total) by mouth daily. 30 tablet 1  . SUMAtriptan (IMITREX) 6 MG/0.5ML SOLN injection Use one injection at onset of migraine.  May repeat in 2 hrs, if needed.  Max dose: 2 inj/day. This is a 30 day prescription. 5 mL 11  . topiramate (TOPAMAX) 100 MG tablet Take 1 tablet (100 mg total) by mouth 2 (two) times daily. 180 tablet 4  . triamterene-hydrochlorothiazide (MAXZIDE-25) 37.5-25 MG tablet Take 1 tablet by mouth daily. 30 tablet 3   No current facility-administered medications for this visit.     Musculoskeletal: Strength & Muscle Tone: N/A Gait & Station: N/A Patient leans: N/A  Psychiatric Specialty Exam: Review of Systems  Psychiatric/Behavioral: Positive for dysphoric mood. Negative for agitation, behavioral problems, confusion, decreased concentration, hallucinations, self-injury, sleep disturbance and suicidal ideas. The patient is nervous/anxious. The patient is not hyperactive.   All other systems reviewed and are negative.   There were no vitals taken for this visit.There is no height or weight on file to calculate BMI.  General Appearance: Fairly Groomed  Eye Contact:  Good  Speech:  Clear and Coherent  Volume:  Normal  Mood:  fine  Affect:  Appropriate, Congruent and euthymic  Thought Process:  Coherent  Orientation:  Full (Time, Place, and Person)  Thought Content: Logical   Suicidal Thoughts:  No  Homicidal Thoughts:  No  Memory:  Immediate;   Good  Judgement:  Good  Insight:  Fair   Psychomotor Activity:  Normal  Concentration:  Concentration: Good and Attention Span: Good  Recall:  Good  Fund of Knowledge: Good  Language: Good  Akathisia:  No  Handed:  Right  AIMS (if indicated): not done  Assets:  Communication Skills Desire for Improvement  ADL's:  Intact  Cognition: WNL  Sleep:  Good   Screenings: GAD-7     Office Visit from 12/10/2019 in [facility] Office Visit from 10/28/2019 in [facility] Office Visit from 09/25/2019 in [facility] Office Visit from 05/21/2019 in [facility]  Total GAD-7 Score 14 15 6 18     PHQ2-9     Office Visit from 12/10/2019 in [facility] Visit from 10/28/2019 in [facility] Visit from 09/25/2019 in [facility] Visit from 09/10/2019 in [facility] Visit from 05/21/2019 in [facility]  PHQ-2 Total Score 2 2 1 6 4   PHQ-9 Total Score 6 6 3 12 7        Assessment and Plan:  Rebecca Boyd is a 44 y.o. year old female with a history of Depressive Disorder NOS and Psychotic Disorder NOS, hypertension,migraine , who presents for follow up appointment for below.   1. GAD (generalized anxiety disorder) 2. MDD (major depressive disorder), recurrent episode, mild ([facility]) There has been no significant change despite up titration of sertraline, and she continues to have weight gain.  Psychosocial stressors includes taking care of her family members with medical condition.  We will cross taper from sertraline to Lexapro to optimize treatment for depression/anxiety.  Discussed potential risk of serotonin syndrome.  She will continue to see Ms. Bynum for therapy.   Plan 1.Change medication as follows  Decrease sertraline 50 mg daily for one week, then discontinue Start lexapro 5 mg daily for one week, then 10 mg daily  2. Next appointment- 10/1 at 9:10 for 20 mins, video - She has an upcoming appointment with  neurologist for evaluation of sleep apnea later in the year - check TSH at the next visit  Past trials of medication: fluoxetine (pruritis), duloxetine (weakness), quetiapine, Xanax  The patient demonstrates the following risk factors for suicide: Chronic risk factors for suicide include:psychiatric disorder ofdepression. Acute risk factorsfor suicide include:loss (financial, interpersonal, professional). Protective factorsfor this patient include:coping skills and hope for the future. Considering these factors, the overall suicide risk at this point appears to below. Patientisappropriate for outpatient follow up.   [name], MD 12/12/2019, 11:27 AM

## 2019-12-09 NOTE — Therapy (Signed)
[place], [place], [address] Phone: [phone]   Fax:  [phone]  Physical Therapy Treatment  Patient Details  Name: Rebecca Boyd MRN: [id number] Date of Birth: 07-10-1975 Referring Provider (PT): Fayrene Helper,  MD   Encounter Date: 12/09/2019   PT End of Session - 12/09/19 1738    Visit Number 6    Number of Visits 12    Date for PT Re-Evaluation 12/25/19    Authorization Type [employer] (30 VL, no auth)    Authorization Time Period 11/13/19 to 12/25/19    PT Start Time 1735    PT Stop Time 1815    PT Time Calculation (min) 40 min    Activity Tolerance Patient tolerated treatment well    Behavior During Therapy [facility] for tasks assessed/performed           Past Medical History:  Diagnosis Date  . Anemia   . Anxiety   . Bacterial vaginosis 07/27/2014  . Cervical neck pain with evidence of disc disease 10/02/2013  . Depressive disorder, not elsewhere classified   . Family history of breast cancer   . Family history of breast cancer in sister 09/25/2018  . Family history of colon cancer   . Family history of lung cancer   . FH: breast cancer in relative when <71 years old 06/10/2018   Dx at age 22  . Headache 03/16/2016  . 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 Hypertension   . Migraine   . Migraines   . Miscarriage 2009  . Neck pain   . Nondisplaced fracture of fifth metatarsal bone, left foot, subsequent encounter for fracture with routine healing 08/15/18 09/25/2018  . Pruritus 09/29/2018  . Pruritus ani 09/25/2018  . Rash and nonspecific skin eruption 05/14/2017  . Vaginitis    cyctitis    Past Surgical History:  Procedure Laterality Date  . cervical cryotherapy N/A 1999  . DILITATION & CURRETTAGE/HYSTROSCOPY WITH THERMACHOICE ABLATION  08/26/2013   Procedure: DILATATION & CURETTAGE/HYSTEROSCOPY WITH THERMACHOICE ENDOMETRIAL ABLATION Procedure #2 Total Therapy  Time=min       sec;  Surgeon: Jonnie Kind, MD;  Location: [facility];  Service: Gynecology;;  . LAPAROSCOPIC BILATERAL SALPINGECTOMY Bilateral 08/26/2013   Procedure: LAPAROSCOPIC BILATERAL SALPINGECTOMY AND REMOVAL OF LEFT PERITUBAL CYST Procedure #1;  Surgeon: Jonnie Kind, MD;  Location: [facility];  Service: Gynecology;  Laterality: Bilateral;  . LAPAROSCOPIC LYSIS OF ADHESIONS  08/26/2013   Procedure: LAPAROSCOPIC LYSIS OF ADHESIONS Procedure #1;  Surgeon: Jonnie Kind, MD;  Location: [facility];  Service: Gynecology;;  . Carmelia Bake LESION REMOVAL  08/26/2013   Procedure: REMOVAL OF VULVAR SEBACEOUS CYST Procedure #3;  Surgeon: Jonnie Kind, MD;  Location: [facility];  Service: Gynecology;;    There were no vitals filed for this visit.   Subjective Assessment - 12/09/19 1736    Subjective Patient says her back and neck have been hurting all weekend with ongoing muscle spasm. "about a 7 today". Patient says HEP exercises have been helpful.    Pertinent History HTN, migraines    Patient Stated Goals to eliminate the pain as much as possible    Currently in Pain? Yes    Pain Score 7     Pain Location Thoracic    Pain Orientation Posterior    Pain Descriptors / Indicators Aching;Spasm    Pain Type Chronic pain    Pain Onset More than  a month ago                             [facility] Adult PT Treatment/Exercise - 12/09/19 0001      Exercises   Exercises Neck;Shoulder      Neck Exercises: Seated   Neck Retraction 10 reps;5 secs      Lumbar Exercises: Seated   Other Seated Lumbar Exercises 3D Thoracic excursions x10 each      Shoulder Exercises: Seated   Retraction 10 reps    Retraction Limitations 10" hold       Shoulder Exercises: Stretch   Cross Chest Stretch 2 reps;30 seconds    Other Shoulder Stretches upper trap stretch 2 x 30" each       Manual Therapy   Manual Therapy Soft tissue mobilization    Manual therapy comments all manual completed separately from other  skilled interventions    Soft tissue mobilization prone position: IASTM using medicine (green) ball to bilateral thoracic paraspinals, upper trap, rhomboids                   PT Education - 12/09/19 1816    Education Details On exercise technique and updated HEP    Person(s) Educated Patient    Methods Explanation;Handout    Comprehension Verbalized understanding            PT Short Term Goals - 11/21/19 1603      PT SHORT TERM GOAL #1   Title Pt will self report 6/10 pain at worst on average during household chores to demo improved strength with functional mobility.    Time 3    Period Weeks    Status On-going    Target Date 12/04/19      PT SHORT TERM GOAL #2   Title Pt will verbalize compliance and understanding of HEP and perform consistently to improve AROM, strength, and reduce pain with mobility.    Time 3    Period Weeks    Status On-going             PT Long Term Goals - 11/21/19 1604      PT LONG TERM GOAL #1   Title Pt will self report 4/10 pain at worst on average during household chores to demo improved strength with functional mobility.    Time 6    Period Weeks    Status On-going      PT LONG TERM GOAL #2   Title Pt will improve FOTO by 10 points to indicate improvement in ability to perform functional tasks with reduced pain.    Time 6    Period Weeks    Status On-going      PT LONG TERM GOAL #3   Title Pt will improve thoracic spine mobility to WNL to improve posture, standing/sitting tolerance, and begin home exercise program.    Time 6    Period Weeks    Status On-going                 Plan - 12/09/19 1824    Clinical Impression Statement Patient tolerated session well overall today. Patient with good return on prior ther ex. Progressed postural strengthening, and scapular muscle flexibility with added stretching this date. Patient educated on proper form and function of all added exercise. Patient required verbal cues to  avoid shoulder elevation during scapular retraction. Patient noted pain reduction to 5/10 post manual treatment. Patient educated on  and issued updated HEP handout.    Personal Factors and Comorbidities Time since onset of injury/illness/exacerbation;Profession    Examination-Activity Limitations Sit    Examination-Participation Restrictions Cleaning;Driving    Stability/Clinical Decision Making Evolving/Moderate complexity    Rehab Potential Good    PT Frequency 2x / week    PT Duration 6 weeks    PT Treatment/Interventions ADLs/Self Care Home Management;Aquatic Therapy;Cryotherapy;Moist Heat;Traction;Gait training;Stair training;Functional mobility training;Therapeutic activities;Therapeutic exercise;Balance training;Neuromuscular re-education;Patient/family education;Orthotic Fit/Training;Manual techniques;Passive range of motion;Dry needling;Joint Manipulations    PT Next Visit Plan Continue stretches to improve AROM throughout spine (rotation), core activation, posture strengthening. Use manual as needed for pain control, to improve spinal mobility.    PT Home Exercise Plan Eval: thoracic rotation, cat-cow quadruped 12/09/19: scap retraction, chin tuck, shoulder adduction stretch           Patient will benefit from skilled therapeutic intervention in order to improve the following deficits and impairments:  Cardiopulmonary status limiting activity, Decreased activity tolerance, Decreased range of motion, Decreased strength, Hypomobility, Increased muscle spasms, Pain  Visit Diagnosis: Other symptoms and signs involving the musculoskeletal system  Pain in thoracic spine     Problem List Patient Active Problem List   Diagnosis Date Noted  . 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 Asthma 10/29/2019  . Lipid screening 09/10/2019  . Frequent nosebleeds 07/11/2019  . Hypokalemia 07/11/2019  . Accelerated hypertension 07/11/2019  . Family history of lung cancer   . Breast pain,  left 02/05/2019  . Major depression with psychotic features ([place]) 12/02/2017  . Chronic migraine 12/06/2016  . Vulvovaginitis 08/10/2016  . Genetic testing 05/10/2016  . Obesity (BMI 30.0-34.9) 03/17/2015  . Essential hypertension, benign 01/07/2014  . Low back pain with radiation 07/30/2013  . Frequent urination 05/18/2011  . Vitamin D deficiency 10/27/2010  . Anxiety 10/26/2010  . Neck pain 07/04/2007   6:25 PM, 12/09/19 Josue Hector PT DPT  Physical Therapist with [facility]  [phone] [place] [address] [place], [place], [address] Phone: [phone]   Fax:  [phone]  Name: Rebecca Boyd MRN: [id number] Date of Birth: 12/06/1975

## 2019-12-10 ENCOUNTER — Encounter: Payer: Self-pay | Admitting: Family Medicine

## 2019-12-10 ENCOUNTER — Ambulatory Visit (INDEPENDENT_AMBULATORY_CARE_PROVIDER_SITE_OTHER): Payer: 59 | Admitting: Family Medicine

## 2019-12-10 VITALS — BP 148/92 | HR 97 | Resp 16 | Ht 60.0 in | Wt 179.0 lb

## 2019-12-10 DIAGNOSIS — Z1159 Encounter for screening for other viral diseases: Secondary | ICD-10-CM

## 2019-12-10 DIAGNOSIS — E559 Vitamin D deficiency, unspecified: Secondary | ICD-10-CM | POA: Diagnosis not present

## 2019-12-10 DIAGNOSIS — R7301 Impaired fasting glucose: Secondary | ICD-10-CM

## 2019-12-10 DIAGNOSIS — F323 Major depressive disorder, single episode, severe with psychotic features: Secondary | ICD-10-CM | POA: Diagnosis not present

## 2019-12-10 DIAGNOSIS — I10 Essential (primary) hypertension: Secondary | ICD-10-CM

## 2019-12-10 DIAGNOSIS — E669 Obesity, unspecified: Secondary | ICD-10-CM

## 2019-12-10 DIAGNOSIS — Z13220 Encounter for screening for lipoid disorders: Secondary | ICD-10-CM

## 2019-12-10 MED ORDER — TRIAMTERENE-HCTZ 37.5-25 MG PO TABS: 1.0000 | ORAL_TABLET | Freq: Every day | ORAL | 3 refills | Status: DC

## 2019-12-10 NOTE — Patient Instructions (Signed)
Annual physical exam  with MD,and  re eval  BP and weight in 2.5 months, call if you need me before  Stop spironolactone , instead start maxzide one daily , and continue amlodipine one daily, BP is high  Fasting labs tomorrow, please reprint including the hep C screen and give pt   Limit calories to 1200 to 1500 cal/ day, NEED to stop drinking calories  It is important that you exercise regularly at least 30 minutes 5 times a week. If you develop chest pain, have severe difficulty breathing, or feel very tired, stop exercising immediately and seek medical attention  Think about what you will eat, plan ahead. Choose " clean, green, fresh or frozen" over canned, processed or packaged foods which are more sugary, salty and fatty. 70 to 75% of food eaten should be vegetables and fruit. Three meals at set times with snacks allowed between meals, but they must be fruit or vegetables. Aim to eat over a 12 hour period , example 7 am to 7 pm, and STOP after  your last meal of the day. Drink water,generally about 64 ounces per day, no other drink is as healthy. Fruit juice is best enjoyed in a healthy way, by EATING the fruit.  Thanks for choosing [facility] we consider it a privelige to serve you.

## 2019-12-12 ENCOUNTER — Telehealth (INDEPENDENT_AMBULATORY_CARE_PROVIDER_SITE_OTHER): Payer: 59 | Admitting: Psychiatry

## 2019-12-12 ENCOUNTER — Other Ambulatory Visit: Payer: Self-pay

## 2019-12-12 ENCOUNTER — Encounter (HOSPITAL_COMMUNITY): Payer: 59 | Admitting: Physical Therapy

## 2019-12-12 ENCOUNTER — Encounter (HOSPITAL_COMMUNITY): Payer: Self-pay | Admitting: Psychiatry

## 2019-12-12 DIAGNOSIS — F330 Major depressive disorder, recurrent, mild: Secondary | ICD-10-CM | POA: Diagnosis not present

## 2019-12-12 DIAGNOSIS — F411 Generalized anxiety disorder: Secondary | ICD-10-CM | POA: Diagnosis not present

## 2019-12-12 MED ORDER — ESCITALOPRAM OXALATE 10 MG PO TABS
ORAL_TABLET | ORAL | 1 refills | Status: DC
Start: 2019-12-12 — End: 2020-04-15

## 2019-12-12 NOTE — Patient Instructions (Signed)
1.Change medication as follows  Decrease sertraline 50 mg daily for one week, then discontinue Start lexapro 5 mg daily for one week, then 10 mg daily  2. Next appointment- 10/1 at 9:10

## 2019-12-13 ENCOUNTER — Encounter: Payer: Self-pay | Admitting: Family Medicine

## 2019-12-13 NOTE — Progress Notes (Signed)
   Rebecca Boyd     MRN: [id number]      DOB: [birth year]   HPI Rebecca Boyd is here for follow up and re-evaluation of chronic medical conditions, medication management and review of any available recent lab and radiology data.  Preventive health is updated, specifically  Cancer screening and Immunization.   Questions or concerns regarding consultations or procedures which the PT has had in the interim are  addressed. The PT denies any adverse reactions to current medications since the last visit.  There are no new concerns.  There are no specific complaints   ROS Denies recent fever or chills. Denies sinus pressure, nasal congestion, ear pain or sore throat. Denies chest congestion, productive cough or wheezing. Denies chest pains, palpitations and leg swelling Denies abdominal pain, nausea, vomiting,diarrhea or constipation.   Denies dysuria, frequency, hesitancy or incontinence. Denies joint pain, swelling and limitation in mobility. Denies headaches, seizures, numbness, or tingling. C/o  depression, anxiety or insomnia. Denies skin break down or rash.   PE  BP (!) 148/92   Pulse 97   Resp 16   Ht 5' (1.524 m)   Wt 179 lb 0.6 oz (81.2 kg)   SpO2 98%   BMI 34.97 kg/m   Patient alert and oriented and in no cardiopulmonary distress.  HEENT: No facial asymmetry, EOMI,     Neck supple .  Chest: Clear to auscultation bilaterally.  CVS: S1, S2 no murmurs, no S3.Regular rate.  ABD: Soft non tender.   Ext: No edema  MS: Adequate ROM spine, shoulders, hips and knees.  Skin: Intact, no ulcerations or rash noted.  Psych: Good eye contact, normal affect. Memory intact not anxious or depressed appearing.  CNS: CN 2-12 intact, power,  normal throughout.no focal deficits noted.   Assessment & Plan  Essential hypertension, benign Uncontrolled , stop spironolactone , new is maxzide, amlodipine as before DASH diet and commitment to daily physical activity for a minimum of  30 minutes discussed and encouraged, as a part of hypertension management. The importance of attaining a healthy weight is also discussed.  BP/Weight 12/10/2019 10/28/2019 09/25/2019 09/10/2019 08/12/2019 08/12/2019 0/86/5784  Systolic BP 696 295 284 132 440 102 725  Diastolic BP 92 92 88 84 82 82 78  Wt. (Lbs) 179.04 175 173 173 174 174 173  BMI 34.97 34.18 33.79 30.65 33.98 33.98 33.79  Some encounter information is confidential and restricted. Go to Review Flowsheets activity to see all data.       Major depression with psychotic features ([place]) Managed by Psych, improving, not suicidal or homicidal  Obesity (BMI 30.0-34.9)  Patient re-educated about  the importance of commitment to a  minimum of 150 minutes of exercise per week as able.  The importance of healthy food choices with portion control discussed, as well as eating regularly and within a 12 hour window most days. The need to choose "clean , green" food 50 to 75% of the time is discussed, as well as to make water the primary drink and set a goal of 64 ounces water daily.    Weight /BMI 12/10/2019 10/28/2019 09/25/2019  WEIGHT 179 lb 0.6 oz 175 lb 173 lb  HEIGHT 5\' 0"  5\' 0"  5\' 0"   BMI 34.97 kg/m2 34.18 kg/m2 33.79 kg/m2  Some encounter information is confidential and restricted. Go to Review Flowsheets activity to see all data.

## 2019-12-13 NOTE — Assessment & Plan Note (Signed)
Uncontrolled , stop spironolactone , new is maxzide, amlodipine as before DASH diet and commitment to daily physical activity for a minimum of 30 minutes discussed and encouraged, as a part of hypertension management. The importance of attaining a healthy weight is also discussed.  BP/Weight 12/10/2019 10/28/2019 09/25/2019 09/10/2019 08/12/2019 08/12/2019 1/61/0960  Systolic BP 454 098 119 147 829 562 130  Diastolic BP 92 92 88 84 82 82 78  Wt. (Lbs) 179.04 175 173 173 174 174 173  BMI 34.97 34.18 33.79 30.65 33.98 33.98 33.79  Some encounter information is confidential and restricted. Go to Review Flowsheets activity to see all data.

## 2019-12-13 NOTE — Assessment & Plan Note (Signed)
  Patient re-educated about  the importance of commitment to a  minimum of 150 minutes of exercise per week as able.  The importance of healthy food choices with portion control discussed, as well as eating regularly and within a 12 hour window most days. The need to choose "clean , green" food 50 to 75% of the time is discussed, as well as to make water the primary drink and set a goal of 64 ounces water daily.    Weight /BMI 12/10/2019 10/28/2019 09/25/2019  WEIGHT 179 lb 0.6 oz 175 lb 173 lb  HEIGHT 5\' 0"  5\' 0"  5\' 0"   BMI 34.97 kg/m2 34.18 kg/m2 33.79 kg/m2  Some encounter information is confidential and restricted. Go to Review Flowsheets activity to see all data.

## 2019-12-13 NOTE — Assessment & Plan Note (Signed)
Managed by Psych, improving, not suicidal or homicidal

## 2019-12-16 ENCOUNTER — Encounter (HOSPITAL_COMMUNITY): Payer: Self-pay | Admitting: Physical Therapy

## 2019-12-16 ENCOUNTER — Other Ambulatory Visit: Payer: Self-pay

## 2019-12-16 ENCOUNTER — Ambulatory Visit (HOSPITAL_COMMUNITY): Payer: 59 | Admitting: Physical Therapy

## 2019-12-16 DIAGNOSIS — M546 Pain in thoracic spine: Secondary | ICD-10-CM

## 2019-12-16 DIAGNOSIS — R29898 Other symptoms and signs involving the musculoskeletal system: Secondary | ICD-10-CM | POA: Diagnosis not present

## 2019-12-16 NOTE — Patient Instructions (Signed)
Access Code: [email or web address] URL: [email or web address] Date: 12/16/2019 Prepared by: Josue Hector  Exercises Standing Row with Anchored Resistance - 2 x daily - 7 x weekly - 2 sets - 10 reps Shoulder Extension with Resistance - 2 x daily - 7 x weekly - 2 sets - 10 reps Corner Pec Major Stretch - 2 x daily - 7 x weekly - 1 sets - 3 reps - 30 sec hold

## 2019-12-16 NOTE — Therapy (Signed)
[place], [place], [address] Phone: [phone]   Fax:  [phone]  Physical Therapy Treatment  Patient Details  Name: Rebecca Boyd MRN: [id number] Date of Birth: [birth year] Referring Provider (PT): Fayrene Helper,  MD   Encounter Date: 12/16/2019   PT End of Session - 12/16/19 1526    Visit Number 7    Number of Visits 12    Date for PT Re-Evaluation 12/25/19    Authorization Type [employer] (30 VL, no auth)    Authorization Time Period 11/13/19 to 12/25/19    PT Start Time 1523    PT Stop Time 1605    PT Time Calculation (min) 42 min    Activity Tolerance Patient tolerated treatment well    Behavior During Therapy [facility] for tasks assessed/performed           Past Medical History:  Diagnosis Date  . Anemia   . Anxiety   . Bacterial vaginosis 07/27/2014  . Cervical neck pain with evidence of disc disease 10/02/2013  . Depressive disorder, not elsewhere classified   . Family history of breast cancer   . Family history of breast cancer in sister 09/25/2018  . Family history of colon cancer   . Family history of lung cancer   . FH: breast cancer in relative when <59 years old 06/10/2018   Dx at age 79  . Headache 03/16/2016  . 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 Hypertension   . Migraine   . Migraines   . Miscarriage 2009  . Neck pain   . Nondisplaced fracture of fifth metatarsal bone, left foot, subsequent encounter for fracture with routine healing 08/15/18 09/25/2018  . Pruritus 09/29/2018  . Pruritus ani 09/25/2018  . Rash and nonspecific skin eruption 05/14/2017  . Vaginitis    cyctitis    Past Surgical History:  Procedure Laterality Date  . cervical cryotherapy N/A 1999  . DILITATION & CURRETTAGE/HYSTROSCOPY WITH THERMACHOICE ABLATION  08/26/2013   Procedure: DILATATION & CURETTAGE/HYSTEROSCOPY WITH THERMACHOICE ENDOMETRIAL ABLATION Procedure #2 Total Therapy  Time=min       sec;  Surgeon: Jonnie Kind, MD;  Location: [facility];  Service: Gynecology;;  . LAPAROSCOPIC BILATERAL SALPINGECTOMY Bilateral 08/26/2013   Procedure: LAPAROSCOPIC BILATERAL SALPINGECTOMY AND REMOVAL OF LEFT PERITUBAL CYST Procedure #1;  Surgeon: Jonnie Kind, MD;  Location: [facility];  Service: Gynecology;  Laterality: Bilateral;  . LAPAROSCOPIC LYSIS OF ADHESIONS  08/26/2013   Procedure: LAPAROSCOPIC LYSIS OF ADHESIONS Procedure #1;  Surgeon: Jonnie Kind, MD;  Location: [facility];  Service: Gynecology;;  . Carmelia Bake LESION REMOVAL  08/26/2013   Procedure: REMOVAL OF VULVAR SEBACEOUS CYST Procedure #3;  Surgeon: Jonnie Kind, MD;  Location: [facility];  Service: Gynecology;;    There were no vitals filed for this visit.   Subjective Assessment - 12/16/19 1525    Subjective Patient says she is doing better today. Notes she has not had any recent muscle spasm.    Pertinent History HTN, migraines    Patient Stated Goals to eliminate the pain as much as possible    Currently in Pain? Yes    Pain Score 3     Pain Location Thoracic    Pain Orientation Posterior    Pain Descriptors / Indicators Aching    Pain Onset More than a month ago  [facility] Adult PT Treatment/Exercise - 12/16/19 0001      Neck Exercises: Seated   Neck Retraction 10 reps;5 secs      Lumbar Exercises: Seated   Other Seated Lumbar Exercises 3D Thoracic excursions x10 each      Shoulder Exercises: Seated   Retraction 10 reps    Retraction Limitations 5" hold       Shoulder Exercises: Standing   Extension Both;20 reps;Theraband    Theraband Level (Shoulder Extension) Level 2 (Red)    Row Both;20 reps;Theraband    Theraband Level (Shoulder Row) Level 2 (Red)    Other Standing Exercises standing shoulder Ys at door  x10      Shoulder Exercises: Stretch   Corner Stretch 2 reps;30 seconds    Cross Chest Stretch 2 reps;30 seconds    Other Shoulder Stretches upper  trap stretch 2 x 30" each                     PT Short Term Goals - 11/21/19 1603      PT SHORT TERM GOAL #1   Title Pt will self report 6/10 pain at worst on average during household chores to demo improved strength with functional mobility.    Time 3    Period Weeks    Status On-going    Target Date 12/04/19      PT SHORT TERM GOAL #2   Title Pt will verbalize compliance and understanding of HEP and perform consistently to improve AROM, strength, and reduce pain with mobility.    Time 3    Period Weeks    Status On-going             PT Long Term Goals - 11/21/19 1604      PT LONG TERM GOAL #1   Title Pt will self report 4/10 pain at worst on average during household chores to demo improved strength with functional mobility.    Time 6    Period Weeks    Status On-going      PT LONG TERM GOAL #2   Title Pt will improve FOTO by 10 points to indicate improvement in ability to perform functional tasks with reduced pain.    Time 6    Period Weeks    Status On-going      PT LONG TERM GOAL #3   Title Pt will improve thoracic spine mobility to WNL to improve posture, standing/sitting tolerance, and begin home exercise program.    Time 6    Period Weeks    Status On-going                 Plan - 12/16/19 1622    Clinical Impression Statement Patient tolerated session well today. Patient showed good return for prior ther ex. Progressed postural reeducation with added corner stretch and band rows/ extension for strengthening. Patient educated on proper form and function of all added exercise. Patient cued on proper hand placement and target muscle activation with band rows. Patient issued updated HEP handout and red band for home use.    Personal Factors and Comorbidities Time since onset of injury/illness/exacerbation;Profession    Examination-Activity Limitations Sit    Examination-Participation Restrictions Cleaning;Driving    Stability/Clinical Decision  Making Evolving/Moderate complexity    Rehab Potential Good    PT Frequency 2x / week    PT Duration 6 weeks    PT Treatment/Interventions ADLs/Self Care Home Management;Aquatic Therapy;Cryotherapy;Moist Heat;Traction;Gait training;Stair training;Functional mobility training;Therapeutic activities;Therapeutic exercise;Balance training;Neuromuscular  re-education;Patient/family education;Orthotic Fit/Training;Manual techniques;Passive range of motion;Dry needling;Joint Manipulations    PT Next Visit Plan Continue stretches to improve AROM throughout spine (rotation), core activation, posture strengthening. Use manual as needed for pain control, to improve spinal mobility.    PT Home Exercise Plan Eval: thoracic rotation, cat-cow quadruped 12/09/19: scap retraction, chin tuck, shoulder adduction stretch 12/16/19: band rows, extensions, corner stretch    Consulted and Agree with Plan of Care Patient           Patient will benefit from skilled therapeutic intervention in order to improve the following deficits and impairments:  Cardiopulmonary status limiting activity, Decreased activity tolerance, Decreased range of motion, Decreased strength, Hypomobility, Increased muscle spasms, Pain  Visit Diagnosis: Other symptoms and signs involving the musculoskeletal system  Pain in thoracic spine     Problem List Patient Active Problem List   Diagnosis Date Noted  . 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 Asthma 10/29/2019  . Lipid screening 09/10/2019  . Frequent nosebleeds 07/11/2019  . Hypokalemia 07/11/2019  . Accelerated hypertension 07/11/2019  . Family history of lung cancer   . Breast pain, left 02/05/2019  . Major depression with psychotic features ([place]) 12/02/2017  . Chronic migraine 12/06/2016  . Genetic testing 05/10/2016  . Obesity (BMI 30.0-34.9) 03/17/2015  . Essential hypertension, benign 01/07/2014  . Low back pain with radiation 07/30/2013  . Vitamin D deficiency  10/27/2010  . Anxiety 10/26/2010  . Neck pain 07/04/2007    4:26 PM, 12/16/19 Josue Hector PT DPT  Physical Therapist with [facility]  [phone] [place] [address] [place], [place], [address] Phone: [phone]   Fax:  [phone]  Name: Rebecca Boyd MRN: [id number] Date of Birth: Mar 24, 1976

## 2019-12-18 ENCOUNTER — Other Ambulatory Visit: Payer: Self-pay

## 2019-12-18 ENCOUNTER — Ambulatory Visit (HOSPITAL_COMMUNITY): Payer: 59 | Admitting: Physical Therapy

## 2019-12-18 ENCOUNTER — Encounter (HOSPITAL_COMMUNITY): Payer: Self-pay | Admitting: Physical Therapy

## 2019-12-18 DIAGNOSIS — R29898 Other symptoms and signs involving the musculoskeletal system: Secondary | ICD-10-CM | POA: Diagnosis not present

## 2019-12-18 DIAGNOSIS — M546 Pain in thoracic spine: Secondary | ICD-10-CM

## 2019-12-18 NOTE — Therapy (Signed)
[place], [place], [address] Phone: [phone]   Fax:  [phone]  Physical Therapy Treatment  Patient Details  Name: Rebecca Boyd MRN: [id number] Date of Birth: [birth year] Referring Provider (PT): Fayrene Helper,  MD   Encounter Date: 12/18/2019   PT End of Session - 12/18/19 1520    Visit Number 8    Number of Visits 12    Date for PT Re-Evaluation 12/25/19    Authorization Type [employer] (30 VL, no auth)    Authorization Time Period 11/13/19 to 12/25/19    PT Start Time 1520    PT Stop Time 1600    PT Time Calculation (min) 40 min    Activity Tolerance Patient tolerated treatment well    Behavior During Therapy [facility] for tasks assessed/performed           Past Medical History:  Diagnosis Date  . Anemia   . Anxiety   . Bacterial vaginosis 07/27/2014  . Cervical neck pain with evidence of disc disease 10/02/2013  . Depressive disorder, not elsewhere classified   . Family history of breast cancer   . Family history of breast cancer in sister 09/25/2018  . Family history of colon cancer   . Family history of lung cancer   . FH: breast cancer in relative when <34 years old 06/10/2018   Dx at age 67  . Headache 03/16/2016  . 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 Hypertension   . Migraine   . Migraines   . Miscarriage 2009  . Neck pain   . Nondisplaced fracture of fifth metatarsal bone, left foot, subsequent encounter for fracture with routine healing 08/15/18 09/25/2018  . Pruritus 09/29/2018  . Pruritus ani 09/25/2018  . Rash and nonspecific skin eruption 05/14/2017  . Vaginitis    cyctitis    Past Surgical History:  Procedure Laterality Date  . cervical cryotherapy N/A 1999  . DILITATION & CURRETTAGE/HYSTROSCOPY WITH THERMACHOICE ABLATION  08/26/2013   Procedure: DILATATION & CURETTAGE/HYSTEROSCOPY WITH THERMACHOICE ENDOMETRIAL ABLATION Procedure #2 Total Therapy  Time=min       sec;  Surgeon: Jonnie Kind, MD;  Location: [facility];  Service: Gynecology;;  . LAPAROSCOPIC BILATERAL SALPINGECTOMY Bilateral 08/26/2013   Procedure: LAPAROSCOPIC BILATERAL SALPINGECTOMY AND REMOVAL OF LEFT PERITUBAL CYST Procedure #1;  Surgeon: Jonnie Kind, MD;  Location: [facility];  Service: Gynecology;  Laterality: Bilateral;  . LAPAROSCOPIC LYSIS OF ADHESIONS  08/26/2013   Procedure: LAPAROSCOPIC LYSIS OF ADHESIONS Procedure #1;  Surgeon: Jonnie Kind, MD;  Location: [facility];  Service: Gynecology;;  . Carmelia Bake LESION REMOVAL  08/26/2013   Procedure: REMOVAL OF VULVAR SEBACEOUS CYST Procedure #3;  Surgeon: Jonnie Kind, MD;  Location: [facility];  Service: Gynecology;;    There were no vitals filed for this visit.   Subjective Assessment - 12/18/19 1519    Subjective Patient state she is feeling good today. She feels like what we have been doing is helpful. She continues to have some pain/stiffness.    Pertinent History HTN, migraines    Patient Stated Goals to eliminate the pain as much as possible    Currently in Pain? No/denies    Pain Onset More than a month ago                             [facility] Adult PT Treatment/Exercise - 12/18/19  0001      Neck Exercises: Standing   Other Standing Exercises lat/thoracic stretch at counter 30 seconds x4       Neck Exercises: Seated   Other Seated Exercise t/sp extension over chair 1x10 with 5 second hold      Neck Exercises: Sidelying   Other Sidelying Exercise open book exercise 1x10 with 5 second holds bilateral      Shoulder Exercises: Seated   Retraction 10 reps    Retraction Limitations 5" hold       Shoulder Exercises: Standing   Extension Both;20 reps;Theraband    Theraband Level (Shoulder Extension) Level 2 (Red)    Row Both;20 reps;Theraband    Theraband Level (Shoulder Row) Level 2 (Red)    Other Standing Exercises palof press 2x10 bilateral                   PT Education -  12/18/19 1519    Education Details On exercise technique and HEP            PT Short Term Goals - 11/21/19 1603      PT SHORT TERM GOAL #1   Title [name] self report 6/10 pain at worst on average during household chores to demo improved strength with functional mobility.    Time 3    Period Weeks    Status On-going    Target Date 12/04/19      PT SHORT TERM GOAL #2   Title [name] verbalize compliance and understanding of HEP and perform consistently to improve AROM, strength, and reduce pain with mobility.    Time 3    Period Weeks    Status On-going             PT Long Term Goals - 11/21/19 1604      PT LONG TERM GOAL #1   Title [name] self report 4/10 pain at worst on average during household chores to demo improved strength with functional mobility.    Time 6    Period Weeks    Status On-going      PT LONG TERM GOAL #2   Title [name] improve FOTO by 10 points to indicate improvement in ability to perform functional tasks with reduced pain.    Time 6    Period Weeks    Status On-going      PT LONG TERM GOAL #3   Title [name] improve thoracic spine mobility to WNL to improve posture, standing/sitting tolerance, and begin home exercise program.    Time 6    Period Weeks    Status On-going                 Plan - 12/18/19 1520    Clinical Impression Statement Patient requires min verbal cueing for open book exercise for spinal mobility. Patient states thoracic extension over chair feels good and targets area of symptoms. Patient able to complete row and extension exercises for postural strengthening without cueing today with good form and mechanics. Patient able to complete ab set with palof exercise with continued work on posture and core strengthening. [name] continue to benefit from skilled physical therapy in order to reduce impairment and improve function.    Personal Factors and Comorbidities Time since onset of  injury/illness/exacerbation;Profession    Examination-Activity Limitations Sit    Examination-Participation Restrictions Cleaning;Driving    Stability/Clinical Decision Making Evolving/Moderate complexity    Rehab Potential Good    PT Frequency 2x /  week    PT Duration 6 weeks    PT Treatment/Interventions ADLs/Self Care Home Management;Aquatic Therapy;Cryotherapy;Moist Heat;Traction;Gait training;Stair training;Functional mobility training;Therapeutic activities;Therapeutic exercise;Balance training;Neuromuscular re-education;Patient/family education;Orthotic Fit/Training;Manual techniques;Passive range of motion;Dry needling;Joint Manipulations    PT Next Visit Plan Continue stretches to improve AROM throughout spine (rotation), core activation, posture strengthening. Use manual as needed for pain control, to improve spinal mobility.    PT Home Exercise Plan Eval: thoracic rotation, cat-cow quadruped 12/09/19: scap retraction, chin tuck, shoulder adduction stretch 12/16/19: band rows, extensions, corner stretch 8/19 t/sp ext over chair    Consulted and Agree with Plan of Care Patient           [name] benefit from skilled therapeutic intervention in order to improve the following deficits and impairments:  Cardiopulmonary status limiting activity, Decreased activity tolerance, Decreased range of motion, Decreased strength, Hypomobility, Increased muscle spasms, Pain  Visit Diagnosis: Pain in thoracic spine  Other symptoms and signs involving the musculoskeletal system     Problem List Patient Active Problem List   Diagnosis Date Noted  . 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 Asthma 10/29/2019  . Lipid screening 09/10/2019  . Frequent nosebleeds 07/11/2019  . Hypokalemia 07/11/2019  . Accelerated hypertension 07/11/2019  . Family history of lung cancer   . Breast pain, left 02/05/2019  . Major depression with psychotic features ([place]) 12/02/2017  . Chronic migraine  12/06/2016  . Genetic testing 05/10/2016  . Obesity (BMI 30.0-34.9) 03/17/2015  . Essential hypertension, benign 01/07/2014  . Low back pain with radiation 07/30/2013  . Vitamin D deficiency 10/27/2010  . Anxiety 10/26/2010  . Neck pain 07/04/2007    4:03 PM, 12/18/19 Mearl Latin PT, DPT Physical Therapist at [place], [place], [address] Phone: [phone]   Fax:  [phone]  Name: Rebecca Boyd MRN: [id number] Date of Birth: December 25, 1975

## 2019-12-23 ENCOUNTER — Other Ambulatory Visit: Payer: Self-pay

## 2019-12-23 ENCOUNTER — Ambulatory Visit (HOSPITAL_COMMUNITY): Payer: 59 | Admitting: Physical Therapy

## 2019-12-23 ENCOUNTER — Encounter (HOSPITAL_COMMUNITY): Payer: Self-pay | Admitting: Physical Therapy

## 2019-12-23 DIAGNOSIS — R29898 Other symptoms and signs involving the musculoskeletal system: Secondary | ICD-10-CM | POA: Diagnosis not present

## 2019-12-23 DIAGNOSIS — M546 Pain in thoracic spine: Secondary | ICD-10-CM

## 2019-12-23 NOTE — Patient Instructions (Signed)
Access Code: [email or web address] URL: [email or web address] Date: 12/23/2019 Prepared by: [facility] Regie Bunner  Exercises Quadruped Thoracic Rotation Full Range with Hand on Neck - 1 x daily - 7 x weekly - 2 sets - 10 reps Shoulder External Rotation and Scapular Retraction with Resistance - 1 x daily - 7 x weekly - 2 sets - 10 reps

## 2019-12-23 NOTE — Therapy (Signed)
[place], [place], [address] Phone: [phone]   Fax:  [phone]  Physical Therapy Treatment  Patient Details  Name: Rebecca Boyd MRN: [id number] Date of Birth: 05-Apr-1976 Referring Provider (PT): Fayrene Helper,  MD   Encounter Date: 12/23/2019   PT End of Session - 12/23/19 1531    Visit Number 9    Number of Visits 12    Date for PT Re-Evaluation 12/25/19    Authorization Type [employer] (30 VL, no auth)    Authorization Time Period 11/13/19 to 12/25/19    PT Start Time 1531   arrives late   PT Stop Time 1600    PT Time Calculation (min) 29 min    Activity Tolerance Patient tolerated treatment well    Behavior During Therapy [facility] for tasks assessed/performed           Past Medical History:  Diagnosis Date  . Anemia   . Anxiety   . Bacterial vaginosis 07/27/2014  . Cervical neck pain with evidence of disc disease 10/02/2013  . Depressive disorder, not elsewhere classified   . Family history of breast cancer   . Family history of breast cancer in sister 09/25/2018  . Family history of colon cancer   . Family history of lung cancer   . FH: breast cancer in relative when <49 years old 06/10/2018   Dx at age 46  . Headache 03/16/2016  . 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 Hypertension   . Migraine   . Migraines   . Miscarriage 2009  . Neck pain   . Nondisplaced fracture of fifth metatarsal bone, left foot, subsequent encounter for fracture with routine healing 08/15/18 09/25/2018  . Pruritus 09/29/2018  . Pruritus ani 09/25/2018  . Rash and nonspecific skin eruption 05/14/2017  . Vaginitis    cyctitis    Past Surgical History:  Procedure Laterality Date  . cervical cryotherapy N/A 1999  . DILITATION & CURRETTAGE/HYSTROSCOPY WITH THERMACHOICE ABLATION  08/26/2013   Procedure: DILATATION & CURETTAGE/HYSTEROSCOPY WITH THERMACHOICE ENDOMETRIAL ABLATION Procedure #2 Total  Therapy Time=min       sec;  Surgeon: Jonnie Kind, MD;  Location: [facility];  Service: Gynecology;;  . LAPAROSCOPIC BILATERAL SALPINGECTOMY Bilateral 08/26/2013   Procedure: LAPAROSCOPIC BILATERAL SALPINGECTOMY AND REMOVAL OF LEFT PERITUBAL CYST Procedure #1;  Surgeon: Jonnie Kind, MD;  Location: [facility];  Service: Gynecology;  Laterality: Bilateral;  . LAPAROSCOPIC LYSIS OF ADHESIONS  08/26/2013   Procedure: LAPAROSCOPIC LYSIS OF ADHESIONS Procedure #1;  Surgeon: Jonnie Kind, MD;  Location: [facility];  Service: Gynecology;;  . Carmelia Bake LESION REMOVAL  08/26/2013   Procedure: REMOVAL OF VULVAR SEBACEOUS CYST Procedure #3;  Surgeon: Jonnie Kind, MD;  Location: [facility];  Service: Gynecology;;    There were no vitals filed for this visit.   Subjective Assessment - 12/23/19 1531    Subjective Patient states she is feeling good today. Exercises at home are going. She will get reassessed Thursday and see what she needs to do.    Pertinent History HTN, migraines    Patient Stated Goals to eliminate the pain as much as possible    Currently in Pain? No/denies    Pain Onset More than a month ago                             [facility] Adult  PT Treatment/Exercise - 12/23/19 0001      Lumbar Exercises: Quadruped   Other Quadruped Lumbar Exercises thoracic rotation 2x10 bilateral      Shoulder Exercises: Prone   Other Prone Exercises prone shoulder retraction 5# 2x15 bilateral, horizontal abduction 5# 1x15      Shoulder Exercises: Standing   Other Standing Exercises scapular retraction, depression, with [employer] ER with red band 2x10; Y lift off at wall 1x10    Other Standing Exercises palof press 2x10 bilateral                   PT Education - 12/23/19 1531    Education Details On exercise technique and HEP    Person(s) Educated Patient    Methods Explanation;Demonstration    Comprehension Verbalized understanding;Returned demonstration            PT Short Term Goals  - 11/21/19 1603      PT SHORT TERM GOAL #1   Title Pt will self report 6/10 pain at worst on average during household chores to demo improved strength with functional mobility.    Time 3    Period Weeks    Status On-going    Target Date 12/04/19      PT SHORT TERM GOAL #2   Title Pt will verbalize compliance and understanding of HEP and perform consistently to improve AROM, strength, and reduce pain with mobility.    Time 3    Period Weeks    Status On-going             PT Long Term Goals - 11/21/19 1604      PT LONG TERM GOAL #1   Title Pt will self report 4/10 pain at worst on average during household chores to demo improved strength with functional mobility.    Time 6    Period Weeks    Status On-going      PT LONG TERM GOAL #2   Title Pt will improve FOTO by 10 points to indicate improvement in ability to perform functional tasks with reduced pain.    Time 6    Period Weeks    Status On-going      PT LONG TERM GOAL #3   Title Pt will improve thoracic spine mobility to WNL to improve posture, standing/sitting tolerance, and begin home exercise program.    Time 6    Period Weeks    Status On-going                 Plan - 12/23/19 1532    Clinical Impression Statement Patient able to complete quadruped thoracic rotation with good mechanics and has greater restriction with L rotation. She requires some cueing with shoulder ER with scapular retraction exercise and notes fatigue in periscapular musculature following. Patient completes prone shoulder exercise with c/o slight cramping feeling but states good challenge. Patient will need reassessment next session with probable discharge. Patient will continue to benefit from skilled physical therapy in order to reduce impairment and improve function.    Personal Factors and Comorbidities Time since onset of injury/illness/exacerbation;Profession    Examination-Activity Limitations Sit    Examination-Participation  Restrictions Cleaning;Driving    Stability/Clinical Decision Making Evolving/Moderate complexity    Rehab Potential Good    PT Frequency 2x / week    PT Duration 6 weeks    PT Treatment/Interventions ADLs/Self Care Home Management;Aquatic Therapy;Cryotherapy;Moist Heat;Traction;Gait training;Stair training;Functional mobility training;Therapeutic activities;Therapeutic exercise;Balance training;Neuromuscular re-education;Patient/family education;Orthotic Fit/Training;Manual techniques;Passive range of motion;Dry needling;Joint Manipulations  PT Next Visit Plan Reassess with probable discharge. Continue stretches to improve AROM throughout spine (rotation), core activation, posture strengthening. Use manual as needed for pain control, to improve spinal mobility.    PT Home Exercise Plan Eval: thoracic rotation, cat-cow quadruped 12/09/19: scap retraction, chin tuck, shoulder adduction stretch 12/16/19: band rows, extensions, corner stretch 8/19 t/sp ext over chair 8/24 thoracic rotation in quadruped, [facility] ER with scapular retraction with band    Consulted and Agree with Plan of Care Patient           Patient will benefit from skilled therapeutic intervention in order to improve the following deficits and impairments:  Cardiopulmonary status limiting activity, Decreased activity tolerance, Decreased range of motion, Decreased strength, Hypomobility, Increased muscle spasms, Pain  Visit Diagnosis: Pain in thoracic spine  Other symptoms and signs involving the musculoskeletal system     Problem List Patient Active Problem List   Diagnosis Date Noted  . 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 Asthma 10/29/2019  . Lipid screening 09/10/2019  . Frequent nosebleeds 07/11/2019  . Hypokalemia 07/11/2019  . Accelerated hypertension 07/11/2019  . Family history of lung cancer   . Breast pain, left 02/05/2019  . Major depression with psychotic features ([place]) 12/02/2017  . Chronic migraine  12/06/2016  . Genetic testing 05/10/2016  . Obesity (BMI 30.0-34.9) 03/17/2015  . Essential hypertension, benign 01/07/2014  . Low back pain with radiation 07/30/2013  . Vitamin D deficiency 10/27/2010  . Anxiety 10/26/2010  . Neck pain 07/04/2007    4:03 PM, 12/23/19 Mearl Latin PT, DPT Physical Therapist at [place], [place], [address] Phone: [phone]   Fax:  [phone]  Name: Rebecca Boyd MRN: [id number] Date of Birth: [birth year]

## 2019-12-25 ENCOUNTER — Encounter (HOSPITAL_COMMUNITY): Payer: Self-pay | Admitting: Physical Therapy

## 2019-12-25 ENCOUNTER — Other Ambulatory Visit: Payer: Self-pay

## 2019-12-25 ENCOUNTER — Ambulatory Visit (HOSPITAL_COMMUNITY): Payer: 59 | Admitting: Physical Therapy

## 2019-12-25 DIAGNOSIS — R29898 Other symptoms and signs involving the musculoskeletal system: Secondary | ICD-10-CM

## 2019-12-25 DIAGNOSIS — M546 Pain in thoracic spine: Secondary | ICD-10-CM

## 2019-12-25 NOTE — Therapy (Signed)
[place], [place], [address] Phone: [phone]   Fax:  [phone]  Physical Therapy Treatment / Discharge Summary  Patient Details  Name: Rebecca Boyd MRN: [id number] Date of Birth: 1976/05/01 Referring Provider (PT): Fayrene Helper,  MD   Encounter Date: 12/25/2019   PHYSICAL THERAPY DISCHARGE SUMMARY  Visits from Start of Care: 10  Current functional level related to goals / functional outcomes: See below   Remaining deficits: See below   Education / Equipment: HEP Plan: Patient agrees to discharge.  Patient goals were partially met. Patient is being discharged due to being pleased with the current functional level.  ?????         PT End of Session - 12/25/19 1534    Visit Number 10    Number of Visits 12    Date for PT Re-Evaluation 12/25/19    Authorization Type [employer] (30 VL, no auth)    Authorization Time Period 11/13/19 to 12/25/19    PT Start Time 1531   PT arrived late   PT Stop Time 1600    PT Time Calculation (min) 29 min    Activity Tolerance Patient tolerated treatment well    Behavior During Therapy WFL for tasks assessed/performed           Past Medical History:  Diagnosis Date  . Anemia   . Anxiety   . Bacterial vaginosis 07/27/2014  . Cervical neck pain with evidence of disc disease 10/02/2013  . Depressive disorder, not elsewhere classified   . Family history of breast cancer   . Family history of breast cancer in sister 09/25/2018  . Family history of colon cancer   . Family history of lung cancer   . FH: breast cancer in relative when <51 years old 06/10/2018   Dx at age 61  . Headache 03/16/2016  . 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 Hypertension   . Migraine   . Migraines   . Miscarriage 2009  . Neck pain   . Nondisplaced fracture of fifth metatarsal bone, left foot, subsequent encounter for fracture with routine healing 08/15/18  09/25/2018  . Pruritus 09/29/2018  . Pruritus ani 09/25/2018  . Rash and nonspecific skin eruption 05/14/2017  . Vaginitis    cyctitis    Past Surgical History:  Procedure Laterality Date  . cervical cryotherapy N/A 1999  . DILITATION & CURRETTAGE/HYSTROSCOPY WITH THERMACHOICE ABLATION  08/26/2013   Procedure: DILATATION & CURETTAGE/HYSTEROSCOPY WITH THERMACHOICE ENDOMETRIAL ABLATION Procedure #2 Total Therapy Time=min       sec;  Surgeon: Jonnie Kind, MD;  Location: [facility];  Service: Gynecology;;  . LAPAROSCOPIC BILATERAL SALPINGECTOMY Bilateral 08/26/2013   Procedure: LAPAROSCOPIC BILATERAL SALPINGECTOMY AND REMOVAL OF LEFT PERITUBAL CYST Procedure #1;  Surgeon: Jonnie Kind, MD;  Location: [facility];  Service: Gynecology;  Laterality: Bilateral;  . LAPAROSCOPIC LYSIS OF ADHESIONS  08/26/2013   Procedure: LAPAROSCOPIC LYSIS OF ADHESIONS Procedure #1;  Surgeon: Jonnie Kind, MD;  Location: [facility];  Service: Gynecology;;  . Carmelia Bake LESION REMOVAL  08/26/2013   Procedure: REMOVAL OF VULVAR SEBACEOUS CYST Procedure #3;  Surgeon: Jonnie Kind, MD;  Location: [facility];  Service: Gynecology;;    There were no vitals filed for this visit.   Subjective Assessment - 12/25/19 1533    Subjective She reported she is feeling good and is ready for discharge. Patient reports 80% improvement since beginning therapy. She  stated her maximum pain has been a 9/10 and that on average it has been a 6/10.    Pertinent History HTN, migraines    Patient Stated Goals to eliminate the pain as much as possible    Currently in Pain? No/denies              [facility] PT Assessment - 12/25/19 0001      Observation/Other Assessments   Focus on Therapeutic Outcomes (FOTO)  37% limited   was 37% limited     AROM   Thoracic Flexion WNL    Thoracic Extension WNL    Thoracic - Right Side Bend WNL - a little pain    Thoracic - Left Side Bend WNL    Thoracic - Right Rotation WNL    Thoracic - Left Rotation WNL                                   PT Education - 12/25/19 1556    Education Details Discussed re-assessment findings, continuing HEP, and going to local gym.    Person(s) Educated Patient    Methods Explanation    Comprehension Verbalized understanding            PT Short Term Goals - 12/25/19 1535      PT SHORT TERM GOAL #1   Title Pt will self report 6/10 pain at worst on average during household chores to demo improved strength with functional mobility.    Baseline 12/25/19: Patient reports an average of 6/10 pain, 9/10 pain at worst    Time 3    Period Weeks    Status Achieved    Target Date 12/04/19      PT SHORT TERM GOAL #2   Title Pt will verbalize compliance and understanding of HEP and perform consistently to improve AROM, strength, and reduce pain with mobility.    Time 3    Period Weeks    Status Achieved             PT Long Term Goals - 12/25/19 1536      PT LONG TERM GOAL #1   Title Pt will self report 4/10 pain at worst on average during household chores to demo improved strength with functional mobility.    Baseline 12/25/19: Patient reports an average of 6/10 pain, 9/10 pain at worst    Time 6    Period Weeks    Status On-going      PT LONG TERM GOAL #2   Title Pt will improve FOTO by 10 points to indicate improvement in ability to perform functional tasks with reduced pain.    Time 6    Period Weeks    Status On-going      PT LONG TERM GOAL #3   Title Pt will improve thoracic spine mobility to WNL to improve posture, standing/sitting tolerance, and begin home exercise program.    Time 6    Period Weeks    Status Achieved                 Plan - 12/25/19 1612    Clinical Impression Statement Performed re-assessment of patient's progress towards goals. Patient achieved 2 out of 2 short term goals. Patient achieved 1 out of 3 long term goals. Patient reports being ready for discharge and reports an 80% improvement since  beginning therapy. Reviewed patient's HEP with her and discussed initiating exercises at local   gym to continue progressing strength and mobility exercises. Patient is being discharged at this time as she is pleased with current functional status.    Personal Factors and Comorbidities Time since onset of injury/illness/exacerbation;Profession    Examination-Activity Limitations Sit    Examination-Participation Restrictions Cleaning;Driving    Stability/Clinical Decision Making Evolving/Moderate complexity    Rehab Potential Good    PT Frequency 2x / week    PT Duration 6 weeks    PT Treatment/Interventions ADLs/Self Care Home Management;Aquatic Therapy;Cryotherapy;Moist Heat;Traction;Gait training;Stair training;Functional mobility training;Therapeutic activities;Therapeutic exercise;Balance training;Neuromuscular re-education;Patient/family education;Orthotic Fit/Training;Manual techniques;Passive range of motion;Dry needling;Joint Manipulations    PT Next Visit Plan Discharged    PT Home Exercise Plan Eval: thoracic rotation, cat-cow quadruped 12/09/19: scap retraction, chin tuck, shoulder adduction stretch 12/16/19: band rows, extensions, corner stretch 8/19 t/sp ext over chair 8/24 thoracic rotation in quadruped, [facility] with scapular retraction with band    Consulted and Agree with Plan of Care Patient           Patient will benefit from skilled therapeutic intervention in order to improve the following deficits and impairments:  Cardiopulmonary status limiting activity, Decreased activity tolerance, Decreased range of motion, Decreased strength, Hypomobility, Increased muscle spasms, Pain  Visit Diagnosis: Pain in thoracic spine  Other symptoms and signs involving the musculoskeletal system     Problem List Patient Active Problem List   Diagnosis Date Noted  . 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 Asthma 10/29/2019  . Lipid screening 09/10/2019  . Frequent nosebleeds 07/11/2019    . Hypokalemia 07/11/2019  . Accelerated hypertension 07/11/2019  . Family history of lung cancer   . Breast pain, left 02/05/2019  . Major depression with psychotic features ([place]) 12/02/2017  . Chronic migraine 12/06/2016  . Genetic testing 05/10/2016  . Obesity (BMI 30.0-34.9) 03/17/2015  . Essential hypertension, benign 01/07/2014  . Low back pain with radiation 07/30/2013  . Vitamin D deficiency 10/27/2010  . Anxiety 10/26/2010  . Neck pain 07/04/2007   Clarene Critchley PT, DPT 4:14 PM, 12/25/19 [place] [address] [place], [place], [address] Phone: [phone]   Fax:  [phone]  Name: Rebecca Boyd MRN: [id number] Date of Birth: 1975-08-20

## 2020-01-07 ENCOUNTER — Other Ambulatory Visit: Payer: Self-pay | Admitting: Family Medicine

## 2020-01-08 MED ORDER — METRONIDAZOLE 0.75 % VA GEL: 1.0000 | Freq: Two times a day (BID) | VAGINAL | 0 refills | Status: DC

## 2020-01-26 NOTE — Progress Notes (Deleted)
[facility] MD/PA/NP OP Progress Note  01/26/2020 4:44 PM Rebecca Boyd  MRN:  [id number]  Chief Complaint:  HPI: *** Visit Diagnosis: No diagnosis found.  Past Psychiatric History: Please see initial evaluation for full details. I have reviewed the history. No updates at this time.     Past Medical History:  Past Medical History:  Diagnosis Date  . Anemia   . Anxiety   . Bacterial vaginosis 07/27/2014  . Cervical neck pain with evidence of disc disease 10/02/2013  . Depressive disorder, not elsewhere classified   . Family history of breast cancer   . Family history of breast cancer in sister 09/25/2018  . Family history of colon cancer   . Family history of lung cancer   . FH: breast cancer in relative when <4 years old 06/10/2018   Dx at age 44  . Headache 03/16/2016  . 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 Hypertension   . Migraine   . Migraines   . Miscarriage 2009  . Neck pain   . Nondisplaced fracture of fifth metatarsal bone, left foot, subsequent encounter for fracture with routine healing 08/15/18 09/25/2018  . Pruritus 09/29/2018  . Pruritus ani 09/25/2018  . Rash and nonspecific skin eruption 05/14/2017  . Vaginitis    cyctitis    Past Surgical History:  Procedure Laterality Date  . cervical cryotherapy N/A 1999  . DILITATION & CURRETTAGE/HYSTROSCOPY WITH THERMACHOICE ABLATION  08/26/2013   Procedure: DILATATION & CURETTAGE/HYSTEROSCOPY WITH THERMACHOICE ENDOMETRIAL ABLATION Procedure #2 Total Therapy Time=min       sec;  Surgeon: Jonnie Kind, MD;  Location: [facility];  Service: Gynecology;;  . LAPAROSCOPIC BILATERAL SALPINGECTOMY Bilateral 08/26/2013   Procedure: LAPAROSCOPIC BILATERAL SALPINGECTOMY AND REMOVAL OF LEFT PERITUBAL CYST Procedure #1;  Surgeon: Jonnie Kind, MD;  Location: [facility];  Service: Gynecology;  Laterality: Bilateral;  . LAPAROSCOPIC LYSIS OF ADHESIONS  08/26/2013   Procedure: LAPAROSCOPIC LYSIS OF ADHESIONS Procedure #1;   Surgeon: Jonnie Kind, MD;  Location: [facility];  Service: Gynecology;;  . Carmelia Bake LESION REMOVAL  08/26/2013   Procedure: REMOVAL OF VULVAR SEBACEOUS CYST Procedure #3;  Surgeon: Jonnie Kind, MD;  Location: [facility];  Service: Gynecology;;    Family Psychiatric History: Please see initial evaluation for full details. I have reviewed the history. No updates at this time.     Family History:  Family History  Problem Relation Age of Onset  . Drug abuse Mother   . Hypertension Father   . Breast cancer Sister        dx 31  . Hypertension Maternal Grandmother   . Hypertension Maternal Grandfather   . Hypertension Paternal Grandmother   . Breast cancer Paternal Grandmother        dx late 36s  . Colon cancer Cousin 89    Social History:  Social History   Socioeconomic History  . Marital status: Single    Spouse name: Not on file  . Number of children: Not on file  . Years of education: Not on file  . Highest education level: Not on file  Occupational History  . Not on file  Tobacco Use  . Smoking status: Former Smoker    Packs/day: 0.00    Types: Cigars    Quit date: 01/05/2017    Years since quitting: 3.0  . Smokeless tobacco: Never Used  Vaping Use  . Vaping Use: Never used  Substance and Sexual Activity  . Alcohol  use: Yes    Comment: occasional  . Drug use: No  . Sexual activity: Yes    Birth control/protection: None  Other Topics Concern  . Not on file  Social History Narrative   Lives home with mother.  Works at [employer].  Education 10th grade/GED.  No children.  Single.     Social Determinants of Health   Financial Resource Strain:   . Difficulty of Paying Living Expenses: Not on file  Food Insecurity:   . Worried About Charity fundraiser in the Last Year: Not on file  . Ran Out of Food in the Last Year: Not on file  Transportation Needs:   . Lack of Transportation (Medical): Not on file  . Lack of Transportation (Non-Medical): Not on file   Physical Activity:   . Days of Exercise per Week: Not on file  . Minutes of Exercise per Session: Not on file  Stress:   . Feeling of Stress : Not on file  Social Connections:   . Frequency of Communication with Friends and Family: Not on file  . Frequency of Social Gatherings with Friends and Family: Not on file  . Attends Religious Services: Not on file  . Active Member of Clubs or Organizations: Not on file  . Attends Archivist Meetings: Not on file  . Marital Status: Not on file    Allergies:  Allergies  Allergen Reactions  . Diphenhydramine Hcl Anaphylaxis    (Benadryl)  . Orange Fruit Hives, Shortness Of Breath, Itching and Other (See Comments)    MAKES IT DIFFICULT TO BREATH  . Latex Rash    Metabolic Disorder Labs: Lab Results  Component Value Date   HGBA1C 5.2 06/10/2018   MPG 103 06/10/2018   MPG 108 05/29/2013   No results found for: PROLACTIN Lab Results  Component Value Date   CHOL 191 06/10/2018   TRIG 174 (H) 06/10/2018   HDL 44 (L) 06/10/2018   CHOLHDL 4.3 06/10/2018   VLDL 19 05/10/2016   LDLCALC 118 (H) 06/10/2018   LDLCALC 117 (H) 05/10/2016   Lab Results  Component Value Date   TSH 1.67 06/10/2018   TSH 1.12 05/10/2016    Therapeutic Level Labs: No results found for: LITHIUM No results found for: VALPROATE No components found for:  CBMZ  Current Medications: Current Outpatient Medications  Medication Sig Dispense Refill  . albuterol (VENTOLIN HFA) 108 (90 Base) MCG/ACT inhaler Inhale 2 puffs into the lungs every 6 (six) hours as needed for wheezing or shortness of breath. 18 g 0  . amLODipine (NORVASC) 10 MG tablet Take 1 tablet (10 mg total) by mouth daily. 30 tablet 1  . cetirizine (ZYRTEC ALLERGY) 10 MG tablet Take 1 tablet (10 mg total) by mouth daily. 30 tablet 1  . Erenumab-aooe (AIMOVIG) 70 MG/ML SOAJ Inject 70 mg into the skin every 30 (thirty) days. 1 pen 11  . escitalopram (LEXAPRO) 10 MG tablet 5 mg daily for one  week, then 10 mg daily 30 tablet 1  . fluticasone furoate-vilanterol (BREO ELLIPTA) 100-25 MCG/INH AEPB Inhale 1 puff into the lungs in the morning and at bedtime. 60 each 5  . metroNIDAZOLE (METROGEL VAGINAL) 0.75 % vaginal gel Place 1 Applicatorful vaginally 2 (two) times daily. 70 g 0  . sertraline (ZOLOFT) 100 MG tablet Take 1 tablet (100 mg total) by mouth daily. 30 tablet 1  . SUMAtriptan (IMITREX) 6 MG/0.5ML SOLN injection Use one injection at onset of migraine.  May  repeat in 2 hrs, if needed.  Max dose: 2 inj/day. This is a 30 day prescription. 5 mL 11  . topiramate (TOPAMAX) 100 MG tablet Take 1 tablet (100 mg total) by mouth 2 (two) times daily. 180 tablet 4  . triamterene-hydrochlorothiazide (MAXZIDE-25) 37.5-25 MG tablet Take 1 tablet by mouth daily. 30 tablet 3   No current facility-administered medications for this visit.     Musculoskeletal: Strength & Muscle Tone: N/A Gait & Station: N/A Patient leans: N/A  Psychiatric Specialty Exam: Review of Systems  There were no vitals taken for this visit.There is no height or weight on file to calculate BMI.  General Appearance: {Appearance:22683}  Eye Contact:  {[facility] EYE CONTACT:22684}  Speech:  Clear and Coherent  Volume:  Normal  Mood:  {[facility] MOOD:22306}  Affect:  {Affect (PAA):22687}  Thought Process:  Coherent  Orientation:  Full (Time, Place, and Person)  Thought Content: Logical   Suicidal Thoughts:  {ST/HT (PAA):22692}  Homicidal Thoughts:  {ST/HT (PAA):22692}  Memory:  Immediate;   Good  Judgement:  {Judgement (PAA):22694}  Insight:  {Insight (PAA):22695}  Psychomotor Activity:  Normal  Concentration:  Concentration: Good and Attention Span: Good  Recall:  Good  Fund of Knowledge: Good  Language: Good  Akathisia:  No  Handed:  Right  AIMS (if indicated): not done  Assets:  Communication Skills Desire for Improvement  ADL's:  Intact  Cognition: WNL  Sleep:  {[facility] GOOD/FAIR/POOR:22877}   Screenings: GAD-7      Office Visit from 12/10/2019 in [facility] Office Visit from 10/28/2019 in [facility] Office Visit from 09/25/2019 in [facility] Office Visit from 05/21/2019 in [facility]  Total GAD-7 Score 14 15 6 18     PHQ2-9     Office Visit from 12/10/2019 in [facility] Office Visit from 10/28/2019 in [facility] Office Visit from 09/25/2019 in [facility] Office Visit from 09/10/2019 in [facility] Office Visit from 05/21/2019 in [facility]  PHQ-2 Total Score 2 2 1 6 4   PHQ-9 Total Score 6 6 3 12 7        Assessment and Plan:  TAMME MOZINGO is a 44 y.o. year old female with a history of Depressive Disorder NOS and Psychotic Disorder NOS, hypertension,migraine , who presents for follow up appointment for below.    1. GAD (generalized anxiety disorder) 2. MDD (major depressive disorder), recurrent episode, mild (HCC) There has been no significant change despite up titration of sertraline, and she continues to have weight gain.  Psychosocial stressors includes taking care of her family members with medical condition.  We will cross taper from sertraline to Lexapro to optimize treatment for depression/anxiety.  Discussed potential risk of serotonin syndrome.  She will continue to see Ms. Bynum for therapy.   Plan 1.Change medication as follows  Decrease sertraline 50 mg daily for one week, then discontinue Start lexapro 5 mg daily for one week, then 10 mg daily  2. Next appointment- 10/1 at 9:10 for 20 mins, video - She has an upcoming appointment with neurologist for evaluation of sleep apnea later in the year - checkTSHat the next visit  Past trials of medication:fluoxetine(pruritis),duloxetine (weakness),quetiapine, Xanax  The patient demonstrates the following risk factors for suicide: Chronic risk factors for suicide include:psychiatric disorder ofdepression. Acute risk  factorsfor suicide include:loss (financial, interpersonal, professional). Protective factorsfor this patient include:coping skills and hope for the future. Considering these factors, the overall suicide risk at this  point appears to below. Patientisappropriate for outpatient follow up.   [name], MD 01/26/2020, 4:44 PM

## 2020-01-30 ENCOUNTER — Telehealth (HOSPITAL_COMMUNITY): Payer: Self-pay | Admitting: Psychiatry

## 2020-01-30 ENCOUNTER — Telehealth (HOSPITAL_COMMUNITY): Payer: 59 | Admitting: Psychiatry

## 2020-01-30 ENCOUNTER — Other Ambulatory Visit: Payer: Self-pay

## 2020-01-30 NOTE — Telephone Encounter (Signed)
Sent link for video visit through Epic. Patient did not sign in. Called the patient  for appointment scheduled today. The patient did not answer the phone. Left voice message to contact the office.  

## 2020-02-11 ENCOUNTER — Other Ambulatory Visit: Payer: Self-pay

## 2020-02-11 ENCOUNTER — Encounter: Payer: Self-pay | Admitting: Family Medicine

## 2020-02-11 ENCOUNTER — Ambulatory Visit (INDEPENDENT_AMBULATORY_CARE_PROVIDER_SITE_OTHER): Payer: 59 | Admitting: Family Medicine

## 2020-02-11 VITALS — BP 136/88 | HR 84 | Resp 20 | Ht 60.0 in | Wt 179.0 lb

## 2020-02-11 DIAGNOSIS — Z0000 Encounter for general adult medical examination without abnormal findings: Secondary | ICD-10-CM

## 2020-02-11 DIAGNOSIS — I10 Essential (primary) hypertension: Secondary | ICD-10-CM | POA: Diagnosis not present

## 2020-02-11 DIAGNOSIS — Z23 Encounter for immunization: Secondary | ICD-10-CM

## 2020-02-11 DIAGNOSIS — R5382 Chronic fatigue, unspecified: Secondary | ICD-10-CM

## 2020-02-11 DIAGNOSIS — Z1231 Encounter for screening mammogram for malignant neoplasm of breast: Secondary | ICD-10-CM | POA: Diagnosis not present

## 2020-02-11 DIAGNOSIS — E669 Obesity, unspecified: Secondary | ICD-10-CM

## 2020-02-11 MED ORDER — AMLODIPINE BESYLATE 10 MG PO TABS: 10.0000 mg | ORAL_TABLET | Freq: Every day | ORAL | 3 refills | Status: DC

## 2020-02-11 MED ORDER — TRIAMTERENE-HCTZ 75-50 MG PO TABS: 1.0000 | ORAL_TABLET | Freq: Every day | ORAL | 4 refills | Status: DC

## 2020-02-11 NOTE — Patient Instructions (Addendum)
Follow-up in office with MD early January call if you need me sooner.  Please schedule mammogram at checkout  Blood pressure is still elevated.  New higher dose of triamterene 75/50 one  tablet once  daily.  Continue amlodipine 10 mg as before.  Flu vaccine in office today.  Please get fasting lab work within the next 1 week.  This is overdue. You are being referred for evaluation of sleep apnea  For detoxing as we discussed you need to stop [facility] as this is loaded with excessive calories as well as sodium which is working against weight loss and blood pressure control.  Please  is notify us when you get your Covid vaccine.  Increase exercise to 5 days/ week  Think about what you will eat, plan ahead. Choose " clean, green, fresh or frozen" over canned, processed or packaged foods which are more sugary, salty and fatty. 70 to 75% of food eaten should be vegetables and fruit. Three meals at set times with snacks allowed between meals, but they must be fruit or vegetables. Aim to eat over a 12 hour period , example 7 am to 7 pm, and STOP after  your last meal of the day. Drink water,generally about 64 ounces per day, no other drink is as healthy. Fruit juice is best enjoyed in a healthy way, by EATING the fruit.  Thanks for choosing [facility], we consider it a privelige to serve you.

## 2020-02-11 NOTE — Assessment & Plan Note (Signed)
Uncontrolled , increase maxzide to 75/50 one daily DASH diet and commitment to daily physical activity for a minimum of 30 minutes discussed and encouraged, as a part of hypertension management. The importance of attaining a healthy weight is also discussed.  BP/Weight 02/11/2020 12/10/2019 10/28/2019 09/25/2019 09/10/2019 08/12/2019 12/27/9369  Systolic BP 696 789 381 017 510 258 527  Diastolic BP 88 92 92 88 84 82 82  Wt. (Lbs) 179 179.04 175 173 173 174 174  BMI 34.96 34.97 34.18 33.79 30.65 33.98 33.98  Some encounter information is confidential and restricted. Go to Review Flowsheets activity to see all data.

## 2020-02-11 NOTE — Assessment & Plan Note (Signed)

## 2020-02-11 NOTE — Assessment & Plan Note (Signed)
  Patient re-educated about  the importance of commitment to a  minimum of 150 minutes of exercise per week as able.  The importance of healthy food choices with portion control discussed, as well as eating regularly and within a 12 hour window most days. The need to choose "clean , green" food 50 to 75% of the time is discussed, as well as to make water the primary drink and set a goal of 64 ounces water daily.    Weight /BMI 02/11/2020 12/10/2019 10/28/2019  WEIGHT 179 lb 179 lb 0.6 oz 175 lb  HEIGHT 5\' 0"  5\' 0"  5\' 0"   BMI 34.96 kg/m2 34.97 kg/m2 34.18 kg/m2  Some encounter information is confidential and restricted. Go to Review Flowsheets activity to see all data.

## 2020-02-13 ENCOUNTER — Other Ambulatory Visit: Payer: Self-pay

## 2020-02-13 ENCOUNTER — Ambulatory Visit
Admission: RE | Admit: 2020-02-13 | Discharge: 2020-02-13 | Disposition: A | Discharge status: Home / Self Care | Payer: 59 | Source: Ambulatory Visit | Attending: Family Medicine | Admitting: Family Medicine

## 2020-02-13 DIAGNOSIS — Z1231 Encounter for screening mammogram for malignant neoplasm of breast: Secondary | ICD-10-CM

## 2020-02-14 ENCOUNTER — Encounter: Payer: Self-pay | Admitting: Family Medicine

## 2020-02-14 NOTE — Progress Notes (Signed)
° ° °  Rebecca Boyd     MRN: [id number]      DOB: 13-Mar-1976  HPI: Patient is in for annual physical exam. Uncontrolled hypertension is addressed. Recent labs, if available are reviewed. Immunization is reviewed , and  updated if needed.   PE: BP 136/88    Pulse 84    Resp 20    Ht 5' (1.524 m)    Wt 179 lb (81.2 kg)    LMP 01/30/2020    SpO2 98%    BMI 34.96 kg/m   Pleasant  female, alert and oriented x 3, in no cardio-pulmonary distress. Afebrile. HEENT No facial trauma or asymetry. Sinuses non tender.  Extra occullar muscles intact.. External ears normal, . Neck: supple, no adenopathy,JVD or thyromegaly.No bruits.  Chest: Clear to ascultation bilaterally.No crackles or wheezes. Non tender to palpation  Breast: Normal mammogram 02/13/2020   Cardiovascular system; Heart sounds normal,  S1 and  S2 ,no S3.  No murmur, or thrill. Peripheral pulses normal.  Abdomen: Soft, non tender,  No guarding, tenderness or rebound.   GU: Asymptomatic , not examined   Musculoskeletal exam: Full ROM of spine, hips , shoulders and knees. No deformity ,swelling or crepitus noted. No muscle wasting or atrophy.   Neurologic: Cranial nerves 2 to 12 intact. Power, tone ,sensation and reflexes normal throughout. No disturbance in gait. No tremor.  Skin: Intact, no ulceration, erythema , scaling or rash noted. Pigmentation normal throughout  Psych; Normal mood and affect. Judgement and concentration normal   Assessment & Plan:  Annual physical exam Annual exam as documented. Counseling done  re healthy lifestyle involving commitment to 150 minutes exercise per week, heart healthy diet, and attaining healthy weight.The importance of adequate sleep also discussed. Regular seat belt use and home safety, is also discussed. Changes in health habits are decided on by the patient with goals and time frames  set for achieving them. Immunization and cancer screening needs are  specifically addressed at this visit.   Essential hypertension, benign Uncontrolled , increase maxzide to 75/50 one daily DASH diet and commitment to daily physical activity for a minimum of 30 minutes discussed and encouraged, as a part of hypertension management. The importance of attaining a healthy weight is also discussed.  BP/Weight 02/11/2020 12/10/2019 10/28/2019 09/25/2019 09/10/2019 08/12/2019 9/70/2637  Systolic BP 858 850 277 412 878 676 720  Diastolic BP 88 92 92 88 84 82 82  Wt. (Lbs) 179 179.04 175 173 173 174 174  BMI 34.96 34.97 34.18 33.79 30.65 33.98 33.98  Some encounter information is confidential and restricted. Go to Review Flowsheets activity to see all data.       Obesity (BMI 30.0-34.9)  Patient re-educated about  the importance of commitment to a  minimum of 150 minutes of exercise per week as able.  The importance of healthy food choices with portion control discussed, as well as eating regularly and within a 12 hour window most days. The need to choose "clean , green" food 50 to 75% of the time is discussed, as well as to make water the primary drink and set a goal of 64 ounces water daily.    Weight /BMI 02/11/2020 12/10/2019 10/28/2019  WEIGHT 179 lb 179 lb 0.6 oz 175 lb  HEIGHT 5\' 0"  5\' 0"  5\' 0"   BMI 34.96 kg/m2 34.97 kg/m2 34.18 kg/m2  Some encounter information is confidential and restricted. Go to Review Flowsheets activity to see all data.

## 2020-02-19 NOTE — Progress Notes (Deleted)
[facility] MD/PA/NP OP Progress Note  02/19/2020 1:16 PM Rebecca Boyd  MRN:  [id number]  Chief Complaint:  HPI: *** Visit Diagnosis: No diagnosis found.  Past Psychiatric History: Please see initial evaluation for full details. I have reviewed the history. No updates at this time.     Past Medical History:  Past Medical History:  Diagnosis Date  . Anemia   . Anxiety   . Bacterial vaginosis 07/27/2014  . Cervical neck pain with evidence of disc disease 10/02/2013  . Depressive disorder, not elsewhere classified   . Family history of breast cancer   . Family history of breast cancer in sister 09/25/2018  . Family history of colon cancer   . Family history of lung cancer   . FH: breast cancer in relative when <85 years old 06/10/2018   Dx at age 61  . Headache 03/16/2016  . 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 Hypertension   . Migraine   . Migraines   . Miscarriage 2009  . Neck pain   . Nondisplaced fracture of fifth metatarsal bone, left foot, subsequent encounter for fracture with routine healing 08/15/18 09/25/2018  . Pruritus 09/29/2018  . Pruritus ani 09/25/2018  . Rash and nonspecific skin eruption 05/14/2017  . Vaginitis    cyctitis    Past Surgical History:  Procedure Laterality Date  . cervical cryotherapy N/A 1999  . DILITATION & CURRETTAGE/HYSTROSCOPY WITH THERMACHOICE ABLATION  08/26/2013   Procedure: DILATATION & CURETTAGE/HYSTEROSCOPY WITH THERMACHOICE ENDOMETRIAL ABLATION Procedure #2 Total Therapy Time=min       sec;  Surgeon: Jonnie Kind, MD;  Location: [facility];  Service: Gynecology;;  . LAPAROSCOPIC BILATERAL SALPINGECTOMY Bilateral 08/26/2013   Procedure: LAPAROSCOPIC BILATERAL SALPINGECTOMY AND REMOVAL OF LEFT PERITUBAL CYST Procedure #1;  Surgeon: Jonnie Kind, MD;  Location: [facility];  Service: Gynecology;  Laterality: Bilateral;  . LAPAROSCOPIC LYSIS OF ADHESIONS  08/26/2013   Procedure: LAPAROSCOPIC LYSIS OF ADHESIONS Procedure #1;   Surgeon: Jonnie Kind, MD;  Location: [facility];  Service: Gynecology;;  . Carmelia Bake LESION REMOVAL  08/26/2013   Procedure: REMOVAL OF VULVAR SEBACEOUS CYST Procedure #3;  Surgeon: Jonnie Kind, MD;  Location: [facility];  Service: Gynecology;;    Family Psychiatric History: Please see initial evaluation for full details. I have reviewed the history. No updates at this time.     Family History:  Family History  Problem Relation Age of Onset  . Drug abuse Mother   . Hypertension Father   . Breast cancer Sister        dx 33  . Hypertension Maternal Grandmother   . Hypertension Maternal Grandfather   . Hypertension Paternal Grandmother   . Breast cancer Paternal Grandmother        dx late 69s  . Colon cancer Cousin 35    Social History:  Social History   Socioeconomic History  . Marital status: Single    Spouse name: Not on file  . Number of children: Not on file  . Years of education: Not on file  . Highest education level: Not on file  Occupational History  . Not on file  Tobacco Use  . Smoking status: Former Smoker    Packs/day: 0.00    Types: Cigars    Quit date: 01/05/2017    Years since quitting: 3.1  . Smokeless tobacco: Never Used  Vaping Use  . Vaping Use: Never used  Substance and Sexual Activity  . Alcohol  use: Yes    Comment: occasional  . Drug use: No  . Sexual activity: Yes    Birth control/protection: None  Other Topics Concern  . Not on file  Social History Narrative   Lives home with mother.  Works at [employer].  Education 10th grade/GED.  No children.  Single.     Social Determinants of Health   Financial Resource Strain:   . Difficulty of Paying Living Expenses: Not on file  Food Insecurity:   . Worried About Charity fundraiser in the Last Year: Not on file  . Ran Out of Food in the Last Year: Not on file  Transportation Needs:   . Lack of Transportation (Medical): Not on file  . Lack of Transportation (Non-Medical): Not on file   Physical Activity:   . Days of Exercise per Week: Not on file  . Minutes of Exercise per Session: Not on file  Stress:   . Feeling of Stress : Not on file  Social Connections:   . Frequency of Communication with Friends and Family: Not on file  . Frequency of Social Gatherings with Friends and Family: Not on file  . Attends Religious Services: Not on file  . Active Member of Clubs or Organizations: Not on file  . Attends Archivist Meetings: Not on file  . Marital Status: Not on file    Allergies:  Allergies  Allergen Reactions  . Diphenhydramine Hcl Anaphylaxis    (Benadryl)  . Orange Fruit Hives, Shortness Of Breath, Itching and Other (See Comments)    MAKES IT DIFFICULT TO BREATH  . Latex Rash    Metabolic Disorder Labs: Lab Results  Component Value Date   HGBA1C 5.2 06/10/2018   MPG 103 06/10/2018   MPG 108 05/29/2013   No results found for: PROLACTIN Lab Results  Component Value Date   CHOL 191 06/10/2018   TRIG 174 (H) 06/10/2018   HDL 44 (L) 06/10/2018   CHOLHDL 4.3 06/10/2018   VLDL 19 05/10/2016   LDLCALC 118 (H) 06/10/2018   LDLCALC 117 (H) 05/10/2016   Lab Results  Component Value Date   TSH 1.67 06/10/2018   TSH 1.12 05/10/2016    Therapeutic Level Labs: No results found for: LITHIUM No results found for: VALPROATE No components found for:  CBMZ  Current Medications: Current Outpatient Medications  Medication Sig Dispense Refill  . amLODipine (NORVASC) 10 MG tablet Take 1 tablet (10 mg total) by mouth daily. 90 tablet 3  . cetirizine (ZYRTEC ALLERGY) 10 MG tablet Take 1 tablet (10 mg total) by mouth daily. 30 tablet 1  . Erenumab-aooe (AIMOVIG) 70 MG/ML SOAJ Inject 70 mg into the skin every 30 (thirty) days. 1 pen 11  . escitalopram (LEXAPRO) 10 MG tablet 5 mg daily for one week, then 10 mg daily 30 tablet 1  . sertraline (ZOLOFT) 100 MG tablet Take 1 tablet (100 mg total) by mouth daily. 30 tablet 1  . SUMAtriptan (IMITREX) 6  MG/0.5ML SOLN injection Use one injection at onset of migraine.  May repeat in 2 hrs, if needed.  Max dose: 2 inj/day. This is a 30 day prescription. 5 mL 11  . topiramate (TOPAMAX) 100 MG tablet Take 1 tablet (100 mg total) by mouth 2 (two) times daily. 180 tablet 4  . triamterene-hydrochlorothiazide (MAXZIDE) 75-50 MG tablet Take 1 tablet by mouth daily. 30 tablet 4   No current facility-administered medications for this visit.     Musculoskeletal: Strength & Muscle  Tone: N/A Gait & Station: N/A Patient leans: N/A  Psychiatric Specialty Exam: Review of Systems  Last menstrual period 01/30/2020.There is no height or weight on file to calculate BMI.  General Appearance: {Appearance:22683}  Eye Contact:  {BHH EYE CONTACT:22684}  Speech:  Clear and Coherent  Volume:  Normal  Mood:  {BHH MOOD:22306}  Affect:  {Affect (PAA):22687}  Thought Process:  Coherent  Orientation:  Full (Time, Place, and Person)  Thought Content: Logical   Suicidal Thoughts:  {ST/HT (PAA):22692}  Homicidal Thoughts:  {ST/HT (PAA):22692}  Memory:  Immediate;   Good  Judgement:  {Judgement (PAA):22694}  Insight:  {Insight (PAA):22695}  Psychomotor Activity:  Normal  Concentration:  Concentration: Good and Attention Span: Good  Recall:  Good  Fund of Knowledge: Good  Language: Good  Akathisia:  No  Handed:  Right  AIMS (if indicated): not done  Assets:  Communication Skills Desire for Improvement  ADL's:  Intact  Cognition: WNL  Sleep:  {BHH GOOD/FAIR/POOR:22877}   Screenings: GAD-7     Office Visit from 12/10/2019 in [facility] Office Visit from 10/28/2019 in [facility] Office Visit from 09/25/2019 in [facility] Office Visit from 05/21/2019 in [facility]  Total GAD-7 Score 14 15 6 18     PHQ2-9     Office Visit from 02/11/2020 in [facility] Visit from 12/10/2019 in [facility] Office Visit from 10/28/2019 in [facility] Office Visit from 09/25/2019 in [facility] Office Visit from 09/10/2019 in [facility]  PHQ-2 Total Score 0 2 2 1 6   PHQ-9 Total Score -- 6 6 3 12        Assessment and Plan:  IVELIZ GARAY is a 44 y.o. year old female with a history of  Depressive Disorder NOS and Psychotic Disorder NOS, hypertension,migraine, who presents for follow up appointment for below.    1. GAD (generalized anxiety disorder) 2. MDD (major depressive disorder), recurrent episode, mild (HCC) There has been no significant change despite up titration of sertraline, and she continues to have weight gain.  Psychosocial stressors includes taking care of her family members with medical condition.  We will cross taper from sertraline to Lexapro to optimize treatment for depression/anxiety.  Discussed potential risk of serotonin syndrome.  She will continue to see Ms. Bynum for therapy.   Plan 1.Change medication as follows  Decrease sertraline 50 mg daily for one week, then discontinue Start lexapro 5 mg daily for one week, then 10 mg daily  2. Next appointment- 10/1 at 9:10 for 20 mins, video - She has an upcoming appointment with neurologist for evaluation of sleep apnea later in the year - checkTSHat the next visit  Past trials of medication:fluoxetine(pruritis),duloxetine (weakness),quetiapine, Xanax  The patient demonstrates the following risk factors for suicide: Chronic risk factors for suicide include:psychiatric disorder ofdepression. Acute risk factorsfor suicide include:loss (financial, interpersonal, professional). Protective factorsfor this patient include:coping skills and hope for the future. Considering these factors, the overall suicide risk at this point appears to below. Patientisappropriate for outpatient follow up.  [name], MD 02/19/2020, 1:16 PM

## 2020-02-25 ENCOUNTER — Other Ambulatory Visit: Payer: Self-pay

## 2020-02-25 ENCOUNTER — Telehealth (HOSPITAL_COMMUNITY): Payer: Self-pay | Admitting: Psychiatry

## 2020-02-25 ENCOUNTER — Telehealth (HOSPITAL_COMMUNITY): Payer: 59 | Admitting: Psychiatry

## 2020-02-25 NOTE — Telephone Encounter (Signed)
Sent link for video visit through Epic. Patient did not sign in. Called the patient  for appointment scheduled today. The patient did not answer the phone. Left voice message to contact the office.  

## 2020-02-26 NOTE — Progress Notes (Deleted)
[facility] MD/PA/NP OP Progress Note  02/26/2020 3:28 PM LEXANY BELKNAP  MRN:  [id number]  Chief Complaint:  HPI: *** Visit Diagnosis: No diagnosis found.  Past Psychiatric History: Please see initial evaluation for full details. I have reviewed the history. No updates at this time.     Past Medical History:  Past Medical History:  Diagnosis Date  . Anemia   . Anxiety   . Bacterial vaginosis 07/27/2014  . Cervical neck pain with evidence of disc disease 10/02/2013  . Depressive disorder, not elsewhere classified   . Family history of breast cancer   . Family history of breast cancer in sister 09/25/2018  . Family history of colon cancer   . Family history of lung cancer   . FH: breast cancer in relative when <33 years old 06/10/2018   Dx at age 43  . Headache 03/16/2016  . 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 Hypertension   . Migraine   . Migraines   . Miscarriage 2009  . Neck pain   . Nondisplaced fracture of fifth metatarsal bone, left foot, subsequent encounter for fracture with routine healing 08/15/18 09/25/2018  . Pruritus 09/29/2018  . Pruritus ani 09/25/2018  . Rash and nonspecific skin eruption 05/14/2017  . Vaginitis    cyctitis    Past Surgical History:  Procedure Laterality Date  . cervical cryotherapy N/A 1999  . DILITATION & CURRETTAGE/HYSTROSCOPY WITH THERMACHOICE ABLATION  08/26/2013   Procedure: DILATATION & CURETTAGE/HYSTEROSCOPY WITH THERMACHOICE ENDOMETRIAL ABLATION Procedure #2 Total Therapy Time=min       sec;  Surgeon: Jonnie Kind, MD;  Location: [facility];  Service: Gynecology;;  . LAPAROSCOPIC BILATERAL SALPINGECTOMY Bilateral 08/26/2013   Procedure: LAPAROSCOPIC BILATERAL SALPINGECTOMY AND REMOVAL OF LEFT PERITUBAL CYST Procedure #1;  Surgeon: Jonnie Kind, MD;  Location: [facility];  Service: Gynecology;  Laterality: Bilateral;  . LAPAROSCOPIC LYSIS OF ADHESIONS  08/26/2013   Procedure: LAPAROSCOPIC LYSIS OF ADHESIONS Procedure #1;   Surgeon: Jonnie Kind, MD;  Location: [facility];  Service: Gynecology;;  . Carmelia Bake LESION REMOVAL  08/26/2013   Procedure: REMOVAL OF VULVAR SEBACEOUS CYST Procedure #3;  Surgeon: Jonnie Kind, MD;  Location: [facility];  Service: Gynecology;;    Family Psychiatric History: Please see initial evaluation for full details. I have reviewed the history. No updates at this time.     Family History:  Family History  Problem Relation Age of Onset  . Drug abuse Mother   . Hypertension Father   . Breast cancer Sister        dx 93  . Hypertension Maternal Grandmother   . Hypertension Maternal Grandfather   . Hypertension Paternal Grandmother   . Breast cancer Paternal Grandmother        dx late 76s  . Colon cancer Cousin 12    Social History:  Social History   Socioeconomic History  . Marital status: Single    Spouse name: Not on file  . Number of children: Not on file  . Years of education: Not on file  . Highest education level: Not on file  Occupational History  . Not on file  Tobacco Use  . Smoking status: Former Smoker    Packs/day: 0.00    Types: Cigars    Quit date: 01/05/2017    Years since quitting: 3.1  . Smokeless tobacco: Never Used  Vaping Use  . Vaping Use: Never used  Substance and Sexual Activity  . Alcohol  use: Yes    Comment: occasional  . Drug use: No  . Sexual activity: Yes    Birth control/protection: None  Other Topics Concern  . Not on file  Social History Narrative   Lives home with mother.  Works at [employer].  Education 10th grade/GED.  No children.  Single.     Social Determinants of Health   Financial Resource Strain:   . Difficulty of Paying Living Expenses: Not on file  Food Insecurity:   . Worried About Charity fundraiser in the Last Year: Not on file  . Ran Out of Food in the Last Year: Not on file  Transportation Needs:   . Lack of Transportation (Medical): Not on file  . Lack of Transportation (Non-Medical): Not on file   Physical Activity:   . Days of Exercise per Week: Not on file  . Minutes of Exercise per Session: Not on file  Stress:   . Feeling of Stress : Not on file  Social Connections:   . Frequency of Communication with Friends and Family: Not on file  . Frequency of Social Gatherings with Friends and Family: Not on file  . Attends Religious Services: Not on file  . Active Member of Clubs or Organizations: Not on file  . Attends Archivist Meetings: Not on file  . Marital Status: Not on file    Allergies:  Allergies  Allergen Reactions  . Diphenhydramine Hcl Anaphylaxis    (Benadryl)  . Orange Fruit Hives, Shortness Of Breath, Itching and Other (See Comments)    MAKES IT DIFFICULT TO BREATH  . Latex Rash    Metabolic Disorder Labs: Lab Results  Component Value Date   HGBA1C 5.2 06/10/2018   MPG 103 06/10/2018   MPG 108 05/29/2013   No results found for: PROLACTIN Lab Results  Component Value Date   CHOL 191 06/10/2018   TRIG 174 (H) 06/10/2018   HDL 44 (L) 06/10/2018   CHOLHDL 4.3 06/10/2018   VLDL 19 05/10/2016   LDLCALC 118 (H) 06/10/2018   LDLCALC 117 (H) 05/10/2016   Lab Results  Component Value Date   TSH 1.67 06/10/2018   TSH 1.12 05/10/2016    Therapeutic Level Labs: No results found for: LITHIUM No results found for: VALPROATE No components found for:  CBMZ  Current Medications: Current Outpatient Medications  Medication Sig Dispense Refill  . amLODipine (NORVASC) 10 MG tablet Take 1 tablet (10 mg total) by mouth daily. 90 tablet 3  . cetirizine (ZYRTEC ALLERGY) 10 MG tablet Take 1 tablet (10 mg total) by mouth daily. 30 tablet 1  . Erenumab-aooe (AIMOVIG) 70 MG/ML SOAJ Inject 70 mg into the skin every 30 (thirty) days. 1 pen 11  . escitalopram (LEXAPRO) 10 MG tablet 5 mg daily for one week, then 10 mg daily 30 tablet 1  . sertraline (ZOLOFT) 100 MG tablet Take 1 tablet (100 mg total) by mouth daily. 30 tablet 1  . SUMAtriptan (IMITREX) 6  MG/0.5ML SOLN injection Use one injection at onset of migraine.  May repeat in 2 hrs, if needed.  Max dose: 2 inj/day. This is a 30 day prescription. 5 mL 11  . topiramate (TOPAMAX) 100 MG tablet Take 1 tablet (100 mg total) by mouth 2 (two) times daily. 180 tablet 4  . triamterene-hydrochlorothiazide (MAXZIDE) 75-50 MG tablet Take 1 tablet by mouth daily. 30 tablet 4   No current facility-administered medications for this visit.     Musculoskeletal: Strength & Muscle  Tone: N/A Gait & Station: N/A Patient leans: N/A  Psychiatric Specialty Exam: Review of Systems  There were no vitals taken for this visit.There is no height or weight on file to calculate BMI.  General Appearance: {Appearance:22683}  Eye Contact:  {BHH EYE CONTACT:22684}  Speech:  Clear and Coherent  Volume:  Normal  Mood:  {BHH MOOD:22306}  Affect:  {Affect (PAA):22687}  Thought Process:  Coherent  Orientation:  Full (Time, Place, and Person)  Thought Content: Logical   Suicidal Thoughts:  {ST/HT (PAA):22692}  Homicidal Thoughts:  {ST/HT (PAA):22692}  Memory:  Immediate;   Good  Judgement:  {Judgement (PAA):22694}  Insight:  {Insight (PAA):22695}  Psychomotor Activity:  Normal  Concentration:  Concentration: Good and Attention Span: Good  Recall:  Good  Fund of Knowledge: Good  Language: Good  Akathisia:  No  Handed:  Right  AIMS (if indicated): not done  Assets:  Communication Skills Desire for Improvement  ADL's:  Intact  Cognition: WNL  Sleep:  {BHH GOOD/FAIR/POOR:22877}   Screenings: GAD-7     Office Visit from 12/10/2019 in [facility] Office Visit from 10/28/2019 in [facility] Office Visit from 09/25/2019 in [facility] Office Visit from 05/21/2019 in [facility]  Total GAD-7 Score 14 15 6 18     PHQ2-9     Office Visit from 02/11/2020 in [facility] Office Visit from 12/10/2019 in [facility] Office Visit from 10/28/2019 in  [facility] Office Visit from 09/25/2019 in [facility] Office Visit from 09/10/2019 in [facility]  PHQ-2 Total Score 0 2 2 1 6   PHQ-9 Total Score -- 6 6 3 12        Assessment and Plan:  TANEESHA EDGIN is a 44 y.o. year old female with a history of Depressive Disorder NOS and Psychotic Disorder NOS, hypertension,migraine, who presents for follow up appointment for below.    1. GAD (generalized anxiety disorder) 2. MDD (major depressive disorder), recurrent episode, mild (HCC) There has been no significant change despite up titration of sertraline, and she continues to have weight gain.  Psychosocial stressors includes taking care of her family members with medical condition.  We will cross taper from sertraline to Lexapro to optimize treatment for depression/anxiety.  Discussed potential risk of serotonin syndrome.  She will continue to see Ms. Bynum for therapy.   Plan 1.Change medication as follows  Decrease sertraline 50 mg daily for one week, then discontinue Start lexapro 5 mg daily for one week, then 10 mg daily  2. Next appointment- 10/1 at 9:10 for 20 mins, video - She has an upcoming appointment with neurologist for evaluation of sleep apnea later in the year - checkTSHat the next visit  Past trials of medication:fluoxetine(pruritis),duloxetine (weakness),quetiapine, Xanax  The patient demonstrates the following risk factors for suicide: Chronic risk factors for suicide include:psychiatric disorder ofdepression. Acute risk factorsfor suicide include:loss (financial, interpersonal, professional). Protective factorsfor this patient include:coping skills and hope for the future. Considering these factors, the overall suicide risk at this point appears to below. Patientisappropriate for outpatient follow up.  [name], MD 02/26/2020, 3:28 PM

## 2020-03-01 NOTE — Progress Notes (Signed)
Virtual Visit via Telephone Note  I connected with Rebecca Boyd on 03/04/20 at  8:30 AM EDT by telephone and verified that I am speaking with the correct person using two identifiers.  Location: Patient: home Provider: office   I discussed the limitations, risks, security and privacy concerns of performing an evaluation and management service by telephone and the availability of in person appointments. I also discussed with the patient that there may be a patient responsible charge related to this service. The patient expressed understanding and agreed to proceed.    I discussed the assessment and treatment plan with the patient. The patient was provided an opportunity to ask questions and all were answered. The patient agreed with the plan and demonstrated an understanding of the instructions.   The patient was advised to call back or seek an in-person evaluation if the symptoms worsen or if the condition fails to improve as anticipated.  I provided 12 minutes of non-face-to-face time during this encounter.   [name], MD     [facility] MD/PA/NP OP Progress Note  03/04/2020 9:27 AM Rebecca Boyd  MRN:  [id number]  Chief Complaint:  Chief Complaint    Depression; Follow-up; Anxiety     HPI:  This is a follow-up appointment for depression.  She states that she has been continued to gain weight since switching to Lexapro.  She has started to work at third shift.  She feels good about getting this job, and denies any concern at work. She feels restless at times.  She has fair sleep. She denies feeling depressed.  She has fair energy and motivation.  She has good concentration.  She denies SI.  She feels anxious and restless at times, and asks if she can be prescribed Xanax, stating that it worked well in the past. Although she has a concern of switching on medication, she agrees to try venlafaxine to see if it affects any weight.    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 GAD (generalized anxiety disorder)  F41.1   2. MDD (major depressive disorder), recurrent, in partial remission ([place])  F33.41     Past Psychiatric History: Please see initial evaluation for full details. I have reviewed the history. No updates at this time.     Past Medical History:  Past Medical History:  Diagnosis Date  . Anemia   . Anxiety   . Bacterial vaginosis 07/27/2014  . Cervical neck pain with evidence of disc disease 10/02/2013  . Depressive disorder, not elsewhere classified   . Family history of breast cancer   . Family history of breast cancer in sister 09/25/2018  . Family history of colon cancer   . Family history of lung cancer   . FH: breast cancer in relative when <62 years old 06/10/2018   Dx at age 64  . Headache 03/16/2016  . 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 Hypertension   . Migraine   . Migraines   . Miscarriage 2009  . Neck pain   . Nondisplaced fracture of fifth metatarsal bone, left foot, subsequent encounter for fracture with  routine healing 08/15/18 09/25/2018  . Pruritus 09/29/2018  . Pruritus ani 09/25/2018  . Rash and nonspecific skin eruption 05/14/2017  . Vaginitis    cyctitis    Past Surgical History:  Procedure Laterality Date  . cervical cryotherapy N/A 1999  . DILITATION & CURRETTAGE/HYSTROSCOPY WITH THERMACHOICE ABLATION  08/26/2013   Procedure: DILATATION & CURETTAGE/HYSTEROSCOPY WITH THERMACHOICE ENDOMETRIAL ABLATION Procedure #2 Total Therapy Time=min       sec;  Surgeon: Jonnie Kind, MD;  Location: [facility];  Service: Gynecology;;  . LAPAROSCOPIC BILATERAL  SALPINGECTOMY Bilateral 08/26/2013   Procedure: LAPAROSCOPIC BILATERAL SALPINGECTOMY AND REMOVAL OF LEFT PERITUBAL CYST Procedure #1;  Surgeon: Jonnie Kind, MD;  Location: [facility];  Service: Gynecology;  Laterality: Bilateral;  . LAPAROSCOPIC LYSIS OF ADHESIONS  08/26/2013   Procedure: LAPAROSCOPIC LYSIS OF ADHESIONS Procedure #1;  Surgeon: Jonnie Kind, MD;  Location: [facility];  Service: Gynecology;;  . Carmelia Bake LESION REMOVAL  08/26/2013   Procedure: REMOVAL OF VULVAR SEBACEOUS CYST Procedure #3;  Surgeon: Jonnie Kind, MD;  Location: [facility];  Service: Gynecology;;    Family Psychiatric History: Please see initial evaluation for full details. I have reviewed the history. No updates at this time.     Family History:  Family History  Problem Relation Age of Onset  . Drug abuse Mother   . Hypertension Father   . Breast cancer Sister        dx 80  . Hypertension Maternal Grandmother   . Hypertension Maternal Grandfather   . Hypertension Paternal Grandmother   . Breast cancer Paternal Grandmother        dx late 45s  . Colon cancer Cousin 28    Social History:  Social History   Socioeconomic History  . Marital status: Single    Spouse name: Not on file  . Number of children: Not on file  . Years of education: Not on file  . Highest education level: Not on file  Occupational History  . Not on file  Tobacco Use  . Smoking status: Former Smoker    Packs/day: 0.00    Types: Cigars    Quit date: 01/05/2017    Years since quitting: 3.1  . Smokeless tobacco: Never Used  Vaping Use  . Vaping Use: Never used  Substance and Sexual Activity  . Alcohol use: Yes    Comment: occasional  . Drug use: No  . Sexual activity: Yes    Birth control/protection: None  Other Topics Concern  . Not on file  Social History Narrative   Lives home with mother.  Works at [employer].  Education 10th grade/GED.  No children.  Single.     Social Determinants of Health   Financial  Resource Strain:   . Difficulty of Paying Living Expenses: Not on file  Food Insecurity:   . Worried About Charity fundraiser in the Last Year: Not on file  . Ran Out of Food in the Last Year: Not on file  Transportation Needs:   . Lack of Transportation (Medical): Not on file  . Lack of Transportation (Non-Medical): Not on file  Physical Activity:   . Days of Exercise per Week: Not on file  . Minutes of Exercise per Session: Not on file  Stress:   . Feeling of Stress : Not on file  Social Connections:   . Frequency of Communication with Friends and Family: Not on file  . Frequency of Social Gatherings with Friends and Family: Not  on file  . Attends Religious Services: Not on file  . Active Member of Clubs or Organizations: Not on file  . Attends Archivist Meetings: Not on file  . Marital Status: Not on file    Allergies:  Allergies  Allergen Reactions  . Diphenhydramine Hcl Anaphylaxis    (Benadryl)  . Orange Fruit Hives, Shortness Of Breath, Itching and Other (See Comments)    MAKES IT DIFFICULT TO BREATH  . Latex Rash    Metabolic Disorder Labs: Lab Results  Component Value Date   HGBA1C 5.2 06/10/2018   MPG 103 06/10/2018   MPG 108 05/29/2013   No results found for: PROLACTIN Lab Results  Component Value Date   CHOL 191 06/10/2018   TRIG 174 (H) 06/10/2018   HDL 44 (L) 06/10/2018   CHOLHDL 4.3 06/10/2018   VLDL 19 05/10/2016   LDLCALC 118 (H) 06/10/2018   LDLCALC 117 (H) 05/10/2016   Lab Results  Component Value Date   TSH 1.67 06/10/2018   TSH 1.12 05/10/2016    Therapeutic Level Labs: No results found for: LITHIUM No results found for: VALPROATE No components found for:  CBMZ  Current Medications: Current Outpatient Medications  Medication Sig Dispense Refill  . amLODipine (NORVASC) 10 MG tablet Take 1 tablet (10 mg total) by mouth daily. 90 tablet 3  . cetirizine (ZYRTEC ALLERGY) 10 MG tablet Take 1 tablet (10 mg total) by mouth  daily. 30 tablet 1  . Erenumab-aooe (AIMOVIG) 70 MG/ML SOAJ Inject 70 mg into the skin every 30 (thirty) days. 1 pen 11  . escitalopram (LEXAPRO) 10 MG tablet 5 mg daily for one week, then 10 mg daily 30 tablet 1  . furosemide (LASIX) 20 MG tablet Take 1 tablet (20 mg total) by mouth daily for 5 days. 5 tablet 0  . potassium chloride SA (KLOR-CON) 20 MEQ tablet Take 1 tablet (20 mEq total) by mouth daily. 5 tablet 0  . sertraline (ZOLOFT) 100 MG tablet Take 1 tablet (100 mg total) by mouth daily. 30 tablet 1  . SUMAtriptan (IMITREX) 6 MG/0.5ML SOLN injection Use one injection at onset of migraine.  May repeat in 2 hrs, if needed.  Max dose: 2 inj/day. This is a 30 day prescription. 5 mL 11  . topiramate (TOPAMAX) 100 MG tablet Take 1 tablet (100 mg total) by mouth 2 (two) times daily. 180 tablet 4  . triamterene-hydrochlorothiazide (MAXZIDE) 75-50 MG tablet Take 1 tablet by mouth daily. 30 tablet 4  . venlafaxine XR (EFFEXOR XR) 37.5 MG 24 hr capsule Take 1 capsule (37.5 mg total) by mouth daily with breakfast. 7 capsule 0  . venlafaxine XR (EFFEXOR XR) 75 MG 24 hr capsule 75 mg daily. Start after completing 37.5 mg daily for one week 30 capsule 1   No current facility-administered medications for this visit.     Musculoskeletal: Strength & Muscle Tone: N/A Gait & Station: N/A Patient leans: N/A  Psychiatric Specialty Exam: Review of Systems  Psychiatric/Behavioral: Negative for agitation, behavioral problems, confusion, decreased concentration, dysphoric mood, hallucinations, self-injury, sleep disturbance and suicidal ideas. The patient is nervous/anxious. The patient is not hyperactive.   All other systems reviewed and are negative.   There were no vitals taken for this visit.There is no height or weight on file to calculate BMI.  General Appearance: Fairly Groomed  Eye Contact:  Good  Speech:  Clear and Coherent  Volume:  Normal  Mood:  good  Affect:  Appropriate  Thought  Process:  Coherent  Orientation:  Full (Time, Place, and Person)  Thought Content: Logical   Suicidal Thoughts:  No  Homicidal Thoughts:  No  Memory:  Immediate;   Good  Judgement:  Good  Insight:  Good  Psychomotor Activity:  Normal  Concentration:  Concentration: Good and Attention Span: Good  Recall:  Good  Fund of Knowledge: Good  Language: Good  Akathisia:  No  Handed:  Right  AIMS (if indicated): not done  Assets:  Communication Skills Desire for Improvement  ADL's:  Intact  Cognition: WNL  Sleep:  Fair   Screenings: GAD-7     Office Visit from 12/10/2019 in [facility] Office Visit from 10/28/2019 in [facility] Office Visit from 09/25/2019 in [facility] Office Visit from 05/21/2019 in [facility]  Total GAD-7 Score 14 15 6 18     PHQ2-9     Video Visit from 03/03/2020 in [facility] Visit from 02/11/2020 in [facility] Visit from 12/10/2019 in [facility] Office Visit from 10/28/2019 in [facility] Office Visit from 09/25/2019 in [facility]  PHQ-2 Total Score 1 0 2 2 1   PHQ-9 Total Score -- -- 6 6 3        Assessment and Plan:  Rebecca Boyd is a 44 y.o. year old female with a history of  Depressive Disorder NOS and Psychotic Disorder NOS, hypertension,migraine, who presents for follow up appointment for below.   1. GAD (generalized anxiety disorder) 2. MDD (major depressive disorder), recurrent, in partial remission ([place]) There has been no significant change despite up titration of sertraline, and she continues to have weight gain.  Psychosocial stressors includes taking care of her family members with medical condition.  We will cross taper from sertraline to Lexapro to optimize treatment for depression/anxiety.  Discussed potential risk of serotonin syndrome.  She will continue to see Ms. Bynum for therapy.   Plan I have reviewed and updated  plans as below 1.Change the medication as follows - Decrease lexapro 5 mg daily for one week, then discontinue - Start venlafaxine 37.5 mg daily for one week, then 75 mg daily  2. Next appointment- 12//16 at 8:20  for 20 mins, video - She has an upcoming appointment with neurologist for evaluation of sleep apnea later in the year - checkTSHat the next visit  Past trials of medication:fluoxetine(pruritis),lexapro, duloxetine (weakness),quetiapine, Xanax  The patient demonstrates the following risk factors for suicide: Chronic risk factors for suicide include:psychiatric disorder ofdepression. Acute risk factorsfor suicide include:loss (financial, interpersonal, professional). Protective factorsfor this patient include:coping skills and hope for the future. Considering these factors, the overall suicide risk at this point appears to below. Patientisappropriate for outpatient follow up.  [name], MD 03/04/2020, 9:27 AM

## 2020-03-02 ENCOUNTER — Encounter: Payer: Self-pay | Admitting: Family Medicine

## 2020-03-02 NOTE — Telephone Encounter (Signed)
Pt made a video visit appt with Dr Moshe Cipro 03-03-20

## 2020-03-03 ENCOUNTER — Telehealth (INDEPENDENT_AMBULATORY_CARE_PROVIDER_SITE_OTHER): Payer: 59 | Admitting: Family Medicine

## 2020-03-03 ENCOUNTER — Other Ambulatory Visit: Payer: Self-pay

## 2020-03-03 ENCOUNTER — Encounter: Payer: Self-pay | Admitting: Family Medicine

## 2020-03-03 VITALS — BP 140/98 | Wt 178.0 lb

## 2020-03-03 DIAGNOSIS — M25473 Effusion, unspecified ankle: Secondary | ICD-10-CM

## 2020-03-03 DIAGNOSIS — E669 Obesity, unspecified: Secondary | ICD-10-CM | POA: Diagnosis not present

## 2020-03-03 DIAGNOSIS — F323 Major depressive disorder, single episode, severe with psychotic features: Secondary | ICD-10-CM | POA: Diagnosis not present

## 2020-03-03 MED ORDER — POTASSIUM CHLORIDE CRYS ER 20 MEQ PO TBCR: 20.0000 meq | EXTENDED_RELEASE_TABLET | Freq: Every day | ORAL | 0 refills | Status: DC

## 2020-03-03 MED ORDER — FUROSEMIDE 20 MG PO TABS: 20.0000 mg | ORAL_TABLET | Freq: Every day | ORAL | 0 refills | Status: DC

## 2020-03-03 NOTE — Assessment & Plan Note (Addendum)
Bilateral x 5 days, unprovoked. Was 2 times normal, now on day 4 , 1.25 times normal, no pain, short course of lasix and potassium are prescribed

## 2020-03-03 NOTE — Patient Instructions (Signed)
Keep appointment  Next week as before  Five day course of lasix and potassium are prescribed, continue all other medication as usual  Please get fasting labs orderd in august for your visit next week

## 2020-03-04 ENCOUNTER — Encounter: Payer: Self-pay | Admitting: Family Medicine

## 2020-03-04 ENCOUNTER — Telehealth (INDEPENDENT_AMBULATORY_CARE_PROVIDER_SITE_OTHER): Payer: 59 | Admitting: Psychiatry

## 2020-03-04 ENCOUNTER — Telehealth (HOSPITAL_COMMUNITY): Payer: 59 | Admitting: Psychiatry

## 2020-03-04 ENCOUNTER — Encounter: Payer: Self-pay | Admitting: Psychiatry

## 2020-03-04 ENCOUNTER — Other Ambulatory Visit: Payer: Self-pay

## 2020-03-04 DIAGNOSIS — F411 Generalized anxiety disorder: Secondary | ICD-10-CM | POA: Diagnosis not present

## 2020-03-04 DIAGNOSIS — F3341 Major depressive disorder, recurrent, in partial remission: Secondary | ICD-10-CM | POA: Diagnosis not present

## 2020-03-04 MED ORDER — VENLAFAXINE HCL ER 37.5 MG PO CP24: 37.5000 mg | ORAL_CAPSULE | Freq: Every day | ORAL | 0 refills | Status: DC

## 2020-03-04 MED ORDER — VENLAFAXINE HCL ER 75 MG PO CP24: ORAL_CAPSULE | ORAL | 1 refills | Status: DC

## 2020-03-04 NOTE — Patient Instructions (Signed)
1.Change the medication as follows - Decrease lexapro 5 mg daily for one week, then discontinue - Start venlafaxine 37.5 mg daily for one week, then 75 mg daily  2. Next appointment- 12//16 at 8:20

## 2020-03-04 NOTE — Assessment & Plan Note (Signed)
Managed by psych improved

## 2020-03-04 NOTE — Progress Notes (Signed)
Virtual Visit via Telephone Note  I connected with Rebecca Boyd on 03/04/20 at  2:00 PM EDT by video and verified that I am speaking with the correct person using two identifiers.  Location: Patient: home Provider: work   I discussed the limitations, risks, security and privacy concerns of performing an evaluation and management service by telephone and the availability of in person appointments. I also discussed with the patient that there may be a patient responsible charge related to this service. The patient expressed understanding and agreed to proceed.   History of Present Illness: 3 day h/o painless swelling of both ankles, no redness , no evidence of insect bite, feet and legs not swollen, no trauma. First episode, started after getting out of the shower. Some reduction over time but not down to normal size as yet   Observations/Objective: BP (!) 140/98 Comment: per pt  Wt 178 lb (80.7 kg) Comment: per pt  BMI 34.76 kg/m  Good communication with no confusion and intact memory. Alert and oriented x 3 No signs of respiratory distress during speech    Assessment and Plan: Ankle swelling Bilateral x 5 days, unprovoked. Was 2 times normal, now on day 4 , 1.25 times normal, no pain, short course of lasix and potassium are prescribed  Major depression with psychotic features ([place]) Managed by psych improved  Obesity (BMI 30.0-34.9)  Patient re-educated about  the importance of commitment to a  minimum of 150 minutes of exercise per week as able.  The importance of healthy food choices with portion control discussed, as well as eating regularly and within a 12 hour window most days. The need to choose "clean , green" food 50 to 75% of the time is discussed, as well as to make water the primary drink and set a goal of 64 ounces water daily.    Weight /BMI 03/03/2020 02/11/2020 12/10/2019  WEIGHT 178 lb 179 lb 179 lb 0.6 oz  HEIGHT - 5\' 0"  5\' 0"   BMI 34.76 kg/m2 34.96  kg/m2 34.97 kg/m2  Some encounter information is confidential and restricted. Go to Review Flowsheets activity to see all data.        Follow Up Instructions:    I discussed the assessment and treatment plan with the patient. The patient was provided an opportunity to ask questions and all were answered. The patient agreed with the plan and demonstrated an understanding of the instructions.   The patient was advised to call back or seek an in-person evaluation if the symptoms worsen or if the condition fails to improve as anticipated.  I provided 10 minutes of non-face-to-face time during this encounter.   Tula Nakayama, MD

## 2020-03-04 NOTE — Assessment & Plan Note (Signed)
  Patient re-educated about  the importance of commitment to a  minimum of 150 minutes of exercise per week as able.  The importance of healthy food choices with portion control discussed, as well as eating regularly and within a 12 hour window most days. The need to choose "clean , green" food 50 to 75% of the time is discussed, as well as to make water the primary drink and set a goal of 64 ounces water daily.    Weight /BMI 03/03/2020 02/11/2020 12/10/2019  WEIGHT 178 lb 179 lb 179 lb 0.6 oz  HEIGHT - 5\' 0"  5\' 0"   BMI 34.76 kg/m2 34.96 kg/m2 34.97 kg/m2  Some encounter information is confidential and restricted. Go to Review Flowsheets activity to see all data.

## 2020-03-11 ENCOUNTER — Ambulatory Visit: Payer: 59 | Admitting: Family Medicine

## 2020-03-16 ENCOUNTER — Ambulatory Visit: Payer: 59 | Admitting: Family Medicine

## 2020-03-18 ENCOUNTER — Ambulatory Visit (INDEPENDENT_AMBULATORY_CARE_PROVIDER_SITE_OTHER): Payer: 59 | Admitting: Family Medicine

## 2020-03-18 ENCOUNTER — Encounter: Payer: Self-pay | Admitting: Family Medicine

## 2020-03-18 ENCOUNTER — Other Ambulatory Visit: Payer: Self-pay

## 2020-03-18 VITALS — BP 120/78 | HR 83 | Resp 16 | Ht 60.0 in | Wt 175.0 lb

## 2020-03-18 DIAGNOSIS — I10 Essential (primary) hypertension: Secondary | ICD-10-CM

## 2020-03-18 DIAGNOSIS — F323 Major depressive disorder, single episode, severe with psychotic features: Secondary | ICD-10-CM | POA: Diagnosis not present

## 2020-03-18 DIAGNOSIS — M7989 Other specified soft tissue disorders: Secondary | ICD-10-CM | POA: Diagnosis not present

## 2020-03-18 DIAGNOSIS — E669 Obesity, unspecified: Secondary | ICD-10-CM

## 2020-03-18 NOTE — Assessment & Plan Note (Addendum)
approx 4 week h/o bilateral leg swelling primarily in the feet , makes  it difficult for her to wear steel toes required for work, at times, goes down overnight. RX hose today and vascular evaluation

## 2020-03-18 NOTE — Patient Instructions (Signed)
Please reschedule January appointment to end February call if you need me sooner.  1.  Lab work today before you leave.  Please follow-up at checkout regarding your appointment for evaluation for sleep apnea you are referred to lung specialist some weeks ago.  A prescription for compression hose between 15 to 19 mmHg is being sent to your local pharmacy.  You are also referred to the vascular surgeon to evaluate leg swelling.  Congrats on weight loss, keep it up  It is important that you exercise regularly at least 30 minutes 5 times a week. If you develop chest pain, have severe difficulty breathing, or feel very tired, stop exercising immediately and seek medical attention  Think about what you will eat, plan ahead. Choose " clean, green, fresh or frozen" over canned, processed or packaged foods which are more sugary, salty and fatty. 70 to 75% of food eaten should be vegetables and fruit. Three meals at set times with snacks allowed between meals, but they must be fruit or vegetables. Aim to eat over a 12 hour period , example 7 am to 7 pm, and STOP after  your last meal of the day. Drink water,generally about 64 ounces per day, no other drink is as healthy. Fruit juice is best enjoyed in a healthy way, by EATING the fruit. Thanks for choosing [facility], we consider it a privelige to serve you.

## 2020-03-18 NOTE — Assessment & Plan Note (Signed)
Controlled, no change in medication DASH diet and commitment to daily physical activity for a minimum of 30 minutes discussed and encouraged, as a part of hypertension management. The importance of attaining a healthy weight is also discussed.  BP/Weight 03/18/2020 03/03/2020 02/11/2020 12/10/2019 10/28/2019 09/25/2019 09/08/2583  Systolic BP 277 824 235 361 443 154 008  Diastolic BP 78 98 88 92 92 88 84  Wt. (Lbs) 175 178 179 179.04 175 173 173  BMI 34.18 34.76 34.96 34.97 34.18 33.79 30.65  Some encounter information is confidential and restricted. Go to Review Flowsheets activity to see all data.

## 2020-03-19 ENCOUNTER — Encounter: Payer: Self-pay | Admitting: Family Medicine

## 2020-03-19 LAB — CBC
Hematocrit: 41.7 % (ref 34.0–46.6)
Hemoglobin: 13.9 g/dL (ref 11.1–15.9)
MCH: 29.7 pg (ref 26.6–33.0)
MCHC: 33.3 g/dL (ref 31.5–35.7)
MCV: 89 fL (ref 79–97)
Platelets: 217 10*3/uL (ref 150–450)
RBC: 4.68 x10E6/uL (ref 3.77–5.28)
RDW: 12.6 % (ref 11.7–15.4)
WBC: 6 10*3/uL (ref 3.4–10.8)

## 2020-03-19 LAB — LIPID PANEL
Chol/HDL Ratio: 4.3 ratio (ref 0.0–4.4)
Cholesterol, Total: 192 mg/dL (ref 100–199)
HDL: 45 mg/dL (ref >39)
LDL Chol Calc (NIH): 133 mg/dL — ABNORMAL HIGH (ref 0–99)
Triglycerides: 76 mg/dL (ref 0–149)
VLDL Cholesterol Cal: 14 mg/dL (ref 5–40)

## 2020-03-19 LAB — TSH: TSH: 0.951 u[IU]/mL (ref 0.450–4.500)

## 2020-03-19 LAB — VITAMIN D 25 HYDROXY (VIT D DEFICIENCY, FRACTURES): Vit D, 25-Hydroxy: 14.3 ng/mL — ABNORMAL LOW (ref 30.0–100.0)

## 2020-03-19 LAB — HEMOGLOBIN A1C
Est. average glucose Bld gHb Est-mCnc: 105 mg/dL
Hgb A1c MFr Bld: 5.3 % (ref 4.8–5.6)

## 2020-03-19 LAB — HEPATITIS C ANTIBODY: Hep C Virus Ab: <0.1 s/co ratio (ref 0.0–0.9)

## 2020-03-19 MED ORDER — ERGOCALCIFEROL 1.25 MG (50000 UT) PO CAPS: 50000.0000 [IU] | ORAL_CAPSULE | ORAL | 2 refills | Status: DC

## 2020-03-19 NOTE — Assessment & Plan Note (Signed)
Improving, managed by Psychiatry

## 2020-03-19 NOTE — Assessment & Plan Note (Signed)
  Patient re-educated about  the importance of commitment to a  minimum of 150 minutes of exercise per week as able.  The importance of healthy food choices with portion control discussed, as well as eating regularly and within a 12 hour window most days. The need to choose "clean , green" food 50 to 75% of the time is discussed, as well as to make water the primary drink and set a goal of 64 ounces water daily.    Weight /BMI 03/18/2020 03/03/2020 02/11/2020  WEIGHT 175 lb 178 lb 179 lb  HEIGHT 5\' 0"  - 5\' 0"   BMI 34.18 kg/m2 34.76 kg/m2 34.96 kg/m2  Some encounter information is confidential and restricted. Go to Review Flowsheets activity to see all data.

## 2020-03-19 NOTE — Progress Notes (Signed)
Rebecca Boyd     MRN: [id number]      DOB: 1975-12-23   HPI Rebecca Boyd is here bilateral foot and ankle swelling which has been occuring for past 2 days, worse as the day goes on, makes it difficult for her to required steel toe shoes to her job. No recent change in medication, has been on amlodipine for a while. Denies symptoms of heart failure when directly questioned. Denies varicose veins ROS Denies recent fever or chills. Denies sinus pressure, nasal congestion, ear pain or sore throat. Denies chest congestion, productive cough or wheezing. Denies chest pains, palpitations, PND or orthopnea Denies abdominal pain, nausea, vomiting,diarrhea or constipation.   Denies dysuria, frequency, hesitancy or incontinence. C/o bilateral ankle  pain, swelling and limitation in mobility. When feet are swollen Denies headaches, seizures, numbness, or tingling. Denies depression, anxiety or insomnia. Denies skin break down or rash.   PE  BP 120/78   Pulse 83   Resp 16   Ht 5' (1.524 m)   Wt 175 lb (79.4 kg)   SpO2 98%   BMI 34.18 kg/m   Patient alert and oriented and in no cardiopulmonary distress.  HEENT: No facial asymmetry, EOMI,     Neck supple .  Chest: Clear to auscultation bilaterally.  CVS: S1, S2 no murmurs, no S3.Regular rate.no varicose veins on exam  ABD: Soft non tender.   Ext: No edema Has photo in her phone which shows 2 plus ankle and foot swelling from a previous day  MS: Adequate ROM spine, shoulders, hips and knees.  Skin: Intact, no ulcerations or rash noted.  Psych: Good eye contact, normal affect. Memory intact not anxious or depressed appearing.  CNS: CN 2-12 intact, power,  normal throughout.no focal deficits noted.   Assessment & Plan  Essential hypertension, benign Controlled, no change in medication DASH diet and commitment to daily physical activity for a minimum of 30 minutes discussed and encouraged, as a part of hypertension  management. The importance of attaining a healthy weight is also discussed.  BP/Weight 03/18/2020 03/03/2020 02/11/2020 12/10/2019 10/28/2019 09/25/2019 2/62/0355  Systolic BP 974 163 845 364 680 321 224  Diastolic BP 78 98 88 92 92 88 84  Wt. (Lbs) 175 178 179 179.04 175 173 173  BMI 34.18 34.76 34.96 34.97 34.18 33.79 30.65  Some encounter information is confidential and restricted. Go to Review Flowsheets activity to see all data.       Leg swelling approx 4 week h/o bilateral leg swelling primarily in the feet , makes  it difficult for her to wear steel toes required for work, at times, goes down overnight. RX hose today and vascular evaluation  Obesity (BMI 30.0-34.9)  Patient re-educated about  the importance of commitment to a  minimum of 150 minutes of exercise per week as able.  The importance of healthy food choices with portion control discussed, as well as eating regularly and within a 12 hour window most days. The need to choose "clean , green" food 50 to 75% of the time is discussed, as well as to make water the primary drink and set a goal of 64 ounces water daily.    Weight /BMI 03/18/2020 03/03/2020 02/11/2020  WEIGHT 175 lb 178 lb 179 lb  HEIGHT 5\' 0"  - 5\' 0"   BMI 34.18 kg/m2 34.76 kg/m2 34.96 kg/m2  Some encounter information is confidential and restricted. Go to Review Flowsheets activity to see all data.      Major depression  with psychotic features ([place]) Improving, managed by Psychiatry

## 2020-03-23 ENCOUNTER — Emergency Department (HOSPITAL_COMMUNITY): Payer: 59

## 2020-03-23 ENCOUNTER — Encounter (HOSPITAL_COMMUNITY): Payer: Self-pay

## 2020-03-23 ENCOUNTER — Emergency Department (HOSPITAL_COMMUNITY)
Admission: EM | Admit: 2020-03-23 | Discharge: 2020-03-23 | Disposition: A | Discharge status: Home / Self Care | Payer: 59 | Attending: Emergency Medicine | Admitting: Emergency Medicine

## 2020-03-23 ENCOUNTER — Other Ambulatory Visit: Payer: Self-pay

## 2020-03-23 DIAGNOSIS — R2241 Localized swelling, mass and lump, right lower limb: Secondary | ICD-10-CM | POA: Diagnosis not present

## 2020-03-23 DIAGNOSIS — Z91040 Latex allergy status: Secondary | ICD-10-CM | POA: Diagnosis not present

## 2020-03-23 DIAGNOSIS — Z87891 Personal history of nicotine dependence: Secondary | ICD-10-CM | POA: Insufficient documentation

## 2020-03-23 DIAGNOSIS — Z79899 Other long term (current) drug therapy: Secondary | ICD-10-CM | POA: Diagnosis not present

## 2020-03-23 DIAGNOSIS — M25471 Effusion, right ankle: Secondary | ICD-10-CM

## 2020-03-23 DIAGNOSIS — R202 Paresthesia of skin: Secondary | ICD-10-CM | POA: Diagnosis not present

## 2020-03-23 DIAGNOSIS — I10 Essential (primary) hypertension: Secondary | ICD-10-CM | POA: Insufficient documentation

## 2020-03-23 LAB — BASIC METABOLIC PANEL
Anion gap: 10 (ref 5–15)
BUN: 9 mg/dL (ref 6–20)
CO2: 24 mmol/L (ref 22–32)
Calcium: 9.1 mg/dL (ref 8.9–10.3)
Chloride: 102 mmol/L (ref 98–111)
Creatinine, Ser: 0.65 mg/dL (ref 0.44–1.00)
GFR, Estimated: >60 mL/min (ref >60)
Glucose, Bld: 96 mg/dL (ref 70–99)
Potassium: 3.1 mmol/L — ABNORMAL LOW (ref 3.5–5.1)
Sodium: 136 mmol/L (ref 135–145)

## 2020-03-23 NOTE — ED Provider Notes (Signed)
[facility] EMERGENCY DEPARTMENT Provider Note   CSN: [id number] Arrival date & time: 03/23/20  4098     History Chief Complaint  Patient presents with  . Foot Swelling    Rebecca Boyd is a 44 y.o. female with obesity, hypertension, chronic migraine, and anxiety.  Patient presents to the emergency department with a chief complaint of right leg swelling.  Patient states the swelling started 2 weeks ago.  Swelling will fluctuate throughout the day.  Denies any anything makes the swelling worse or better.  Patient also reports swelling in her left leg but states it is worse in her right.  Patient also reports an acute onset of burning and tingling in her right foot, ankle and lower right leg.  Patient reports that the symptoms are persistent.  Patient states that she also started experiencing some numbness in her right foot this morning.  Patient denies any recent trauma or injuries.  She denies any estrogen use, recent surgery, history of prior DVT or PE, prolonged immobilization or active cancer.  Patient denies any fever, chills, shortness of breath, chest pain, orthopnea, or PND.        Past Medical History:  Diagnosis Date  . Anemia   . Anxiety   . Bacterial vaginosis 07/27/2014  . Cervical neck pain with evidence of disc disease 10/02/2013  . Depressive disorder, not elsewhere classified   . Family history of breast cancer   . Family history of breast cancer in sister 09/25/2018  . Family history of colon cancer   . Family history of lung cancer   . FH: breast cancer in relative when <73 years old 06/10/2018   Dx at age 62  . Headache 03/16/2016  . 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 Hypertension   . Migraine   . Migraines   . Miscarriage 2009  . Neck pain   . Nondisplaced fracture of fifth metatarsal bone, left foot, subsequent encounter for fracture with routine healing 08/15/18 09/25/2018  . Pruritus 09/29/2018  . Pruritus ani 09/25/2018  . Rash  and nonspecific skin eruption 05/14/2017  . Vaginitis    cyctitis    Patient Active Problem List   Diagnosis Date Noted  . Leg swelling 03/18/2020  . Ankle swelling 03/03/2020  . 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 Asthma 10/29/2019  . Lipid screening 09/10/2019  . Frequent nosebleeds 07/11/2019  . Hypokalemia 07/11/2019  . Family history of lung cancer   . Breast pain, left 02/05/2019  . Major depression with psychotic features ([place]) 12/02/2017  . Chronic migraine 12/06/2016  . Genetic testing 05/10/2016  . Obesity (BMI 30.0-34.9) 03/17/2015  . Essential hypertension, benign 01/07/2014  . Low back pain with radiation 07/30/2013  . Vitamin D deficiency 10/27/2010  . Anxiety 10/26/2010  . Neck pain 07/04/2007    Past Surgical History:  Procedure Laterality Date  . cervical cryotherapy N/A 1999  . DILITATION & CURRETTAGE/HYSTROSCOPY WITH THERMACHOICE ABLATION  08/26/2013   Procedure: DILATATION & CURETTAGE/HYSTEROSCOPY WITH THERMACHOICE ENDOMETRIAL ABLATION Procedure #2 Total Therapy Time=min       sec;  Surgeon: Jonnie Kind, MD;  Location: [facility];  Service: Gynecology;;  . LAPAROSCOPIC BILATERAL SALPINGECTOMY Bilateral 08/26/2013   Procedure: LAPAROSCOPIC BILATERAL SALPINGECTOMY AND REMOVAL OF LEFT PERITUBAL CYST Procedure #1;  Surgeon: Jonnie Kind, MD;  Location: [facility];  Service: Gynecology;  Laterality: Bilateral;  . LAPAROSCOPIC LYSIS OF ADHESIONS  08/26/2013   Procedure: LAPAROSCOPIC LYSIS OF ADHESIONS Procedure #  1;  Surgeon: Jonnie Kind, MD;  Location: [facility];  Service: Gynecology;;  . Carmelia Bake LESION REMOVAL  08/26/2013   Procedure: REMOVAL OF VULVAR SEBACEOUS CYST Procedure #3;  Surgeon: Jonnie Kind, MD;  Location: [facility];  Service: Gynecology;;     OB History   No obstetric history on file.     Family History  Problem Relation Age of Onset  . Drug abuse Mother   . Hypertension Father   . Breast cancer Sister        dx 46  . Hypertension  Maternal Grandmother   . Hypertension Maternal Grandfather   . Hypertension Paternal Grandmother   . Breast cancer Paternal Grandmother        dx late 74s  . Colon cancer Cousin 80    Social History   Tobacco Use  . Smoking status: Former Smoker    Packs/day: 0.00    Types: Cigars    Quit date: 01/05/2017    Years since quitting: 3.2  . Smokeless tobacco: Never Used  Vaping Use  . Vaping Use: Never used  Substance Use Topics  . Alcohol use: Yes    Comment: occasional  . Drug use: No    Home Medications Prior to Admission medications   Medication Sig Start Date End Date Taking? Authorizing Provider  amLODipine (NORVASC) 10 MG tablet Take 1 tablet (10 mg total) by mouth daily. 02/11/20   Fayrene Helper, MD  cetirizine (ZYRTEC ALLERGY) 10 MG tablet Take 1 tablet (10 mg total) by mouth daily. 04/10/19   Perlie Mayo, NP  Erenumab-aooe (AIMOVIG) 70 MG/ML SOAJ Inject 70 mg into the skin every 30 (thirty) days. 08/12/19   Suzzanne Cloud, NP  ergocalciferol (VITAMIN D2) 1.25 MG (50000 UT) capsule Take 1 capsule (50,000 Units total) by mouth once a week. One capsule once weekly 03/22/20   Fayrene Helper, MD  escitalopram (LEXAPRO) 10 MG tablet 5 mg daily for one week, then 10 mg daily 12/12/19   [name], MD  sertraline (ZOLOFT) 100 MG tablet Take 1 tablet (100 mg total) by mouth daily. 10/31/19   [name], MD  SUMAtriptan (IMITREX) 6 MG/0.5ML SOLN injection Use one injection at onset of migraine.  May repeat in 2 hrs, if needed.  Max dose: 2 inj/day. This is a 30 day prescription. 08/12/19   Suzzanne Cloud, NP  topiramate (TOPAMAX) 100 MG tablet Take 1 tablet (100 mg total) by mouth 2 (two) times daily. 08/12/19   Suzzanne Cloud, NP  triamterene-hydrochlorothiazide (MAXZIDE) 75-50 MG tablet Take 1 tablet by mouth daily. 02/11/20   Fayrene Helper, MD  venlafaxine XR (EFFEXOR XR) 75 MG 24 hr capsule 75 mg daily. Start after completing 37.5 mg daily for one week 03/04/20    [name], MD  fluticasone (FLONASE) 50 MCG/ACT nasal spray Place 2 sprays into both nostrils daily. 04/10/19 07/08/19  Perlie Mayo, NP    Allergies    Diphenhydramine hcl, Orange fruit, and Latex  Review of Systems   Review of Systems  Constitutional: Negative for chills and fever.  HENT: Negative.   Eyes: Negative.   Respiratory: Negative for cough, chest tightness and shortness of breath.        No orthopnea or PND  Cardiovascular: Negative for chest pain and palpitations. Leg swelling: right ankle.  Endocrine: Negative.   Genitourinary: Negative.   Musculoskeletal: Negative.        Right ankle swelling   Skin: Negative for  color change, rash and wound.  Neurological: Positive for numbness (right dorsum of foot). Negative for dizziness, tremors, syncope, weakness, light-headedness and headaches.    Physical Exam Updated Vital Signs BP 123/83   Pulse 84   Temp 98.2 F (36.8 C) (Oral)   Resp 19   Ht 5' (1.524 m)   Wt 79.4 kg   LMP 03/20/2020   SpO2 99%   BMI 34.18 kg/m   Physical Exam Constitutional:      General: She is not in acute distress.    Appearance: She is obese. She is not ill-appearing or toxic-appearing.  HENT:     Head: Normocephalic and atraumatic.  Cardiovascular:     Rate and Rhythm: Normal rate and regular rhythm.     Pulses:          Dorsalis pedis pulses are 3+ on the right side and 3+ on the left side.     Heart sounds: Normal heart sounds.  Pulmonary:     Effort: Pulmonary effort is normal.     Breath sounds: Normal breath sounds.  Musculoskeletal:     Right lower leg: No edema.     Left lower leg: No edema.     Right ankle: Swelling (minimal to medial and lateral ankle) present. No deformity, ecchymosis or lacerations. Tenderness present over the lateral malleolus and medial malleolus. Normal range of motion. Normal pulse.     Left ankle: No swelling, deformity, ecchymosis or lacerations. No tenderness. Normal range of motion.  Normal pulse.     Right foot: Normal range of motion. Swelling (minimal ) and tenderness (dorsum) present. No deformity or laceration. Normal pulse.     Left foot: Normal range of motion. No swelling, deformity, laceration or tenderness. Normal pulse.  Feet:     Comments: . Skin:    General: Skin is warm and dry.  Neurological:     General: No focal deficit present.     Mental Status: She is alert.     Motor: No weakness.     ED Results / Procedures / Treatments   Labs (all labs ordered are listed, but only abnormal results are displayed) Labs Reviewed  BASIC METABOLIC PANEL - Abnormal; Notable for the following components:      Result Value   Potassium 3.1 (*)    All other components within normal limits    EKG None  Radiology US Venous Img Lower Unilateral Right  Result Date: 03/23/2020 CLINICAL DATA:  44 year old female with right lower extremity swelling EXAM: RIGHT LOWER EXTREMITY VENOUS DOPPLER ULTRASOUND TECHNIQUE: Gray-scale sonography with graded compression, as well as color Doppler and duplex ultrasound were performed to evaluate the right lower extremity deep venous systems from the level of the common femoral vein and including the common femoral, femoral, profunda femoral, popliteal and calf veins including the posterior tibial, peroneal and gastrocnemius veins when visible. Spectral Doppler was utilized to evaluate flow at rest and with distal augmentation maneuvers in the common femoral, femoral and popliteal veins. The contralateral common femoral vein was also evaluated for comparison. COMPARISON:  01/20/2016 FINDINGS: RIGHT LOWER EXTREMITY Common Femoral Vein: No evidence of thrombus. Normal compressibility, respiratory phasicity and response to augmentation. Central Greater Saphenous Vein: No evidence of thrombus. Normal compressibility and flow on color Doppler imaging. Central Profunda Femoral Vein: No evidence of thrombus. Normal compressibility and flow on color  Doppler imaging. Femoral Vein: No evidence of thrombus. Normal compressibility, respiratory phasicity and response to augmentation. Popliteal Vein: No evidence of thrombus.  Normal compressibility, respiratory phasicity and response to augmentation. Calf Veins: No evidence of thrombus. Normal compressibility and flow on color Doppler imaging. Other Findings:  None. LEFT LOWER EXTREMITY Common Femoral Vein: No evidence of thrombus. Normal compressibility, respiratory phasicity and response to augmentation. IMPRESSION: No evidence of right lower extremity deep venous thrombosis. Ruthann Cancer, MD [facility] Radiology Electronically Signed   By: Ruthann Cancer MD   On: 03/23/2020 10:17    Procedures Procedures (including critical care time)  Medications Ordered in ED Medications - No data to display  ED Course  I have reviewed the triage vital signs and the nursing notes.  Pertinent labs & imaging results that were available during my care of the patient were reviewed by me and considered in my medical decision making (see chart for details).    MDM Rules/Calculators/A&P                          44 year old obese female with a history of hypertension presents with a chief complaint of right leg swelling.  Swelling has been present for the past 2 weeks.  She developed burning and tingling in right foot and ankle yesterday and numbness in right foot this morning.  She denies any recent falls or traumatic injuries.  Patient denies any symptoms related to heart failure including shortness of breath, dyspnea, orthopnea, or PND.  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  Patient reports was recently seen by her PCP for complaint of leg swelling but denies any sx of numbness or tingling at that time.  Patient has swelling to medial and lateral ankle.  Complains of tenderness to palpation and light touch throughout dorsum of right foot and  medial lateral ankle.  Pulse, motor, strength, and sensation are intact in bilateral extremities.  No signs of infection.    [place] of right lower leg was ordered to evaluate for the presence of DVT.  US showed no evidence of right lower extremity DVT.  Patient vitals have remained stable, no indication for admission, patient is safe for discharge.  Patient to follow-up with PCP if lower leg swelling persists.   Patient was instructed to wear compression stockings during the day to help alleviate her swelling.  Patient was given strict return precautions.  Patient expressed understanding and all her questions were answered.   Final Clinical Impression(s) / ED Diagnoses Final diagnoses:  Right ankle swelling    Rx / DC Orders ED Discharge Orders    None       Dyann Ruddle 03/23/20 1819    Noemi Chapel, MD 03/27/20 1513

## 2020-03-23 NOTE — ED Provider Notes (Signed)
This patient is a pleasant 44 year old female with no history of DVT, she has no risk factors for DVT, she does work on her feet standing at a machine all day long for her work but notes that over the last 2 weeks she has had progressive swelling of her leg.  Gradually worsening, not associate with shortness of breath, she does have some numbness and tingling in the right foot.  Pt has seen PCP - started labs - no BMP obtained by PCP - will get this as well with [place].  On exam the patient does have some asymmetrical swelling of her ankle and foot, it is minimal, there is normal pulses at the dorsalis pedis and posterior tibialis bilaterally.  There is no significant edema in the pretibial region.  Heart and lung exams are unremarkable, rule out DVT with ultrasound, otherwise patient stable for discharge, vital signs are unremarkable with no hypertension tachycardia or hypoxia.  Patient agreeable to follow-up in the outpatient setting, recommended compression stockings.  Medical screening examination/treatment/procedure(s) were conducted as a shared visit with non-physician practitioner(s) and myself.  I personally evaluated the patient during the encounter.  Clinical Impression:   Final diagnoses:  Right ankle swelling         Rebecca Chapel, MD 03/27/20 1513

## 2020-03-23 NOTE — Discharge Instructions (Signed)
You came to the emergency department today with a complaint of right leg swelling and burning and tingling.  We evaluate you waited you for a DVT also commonly known as a blood clot in your leg.  Ultrasound imaging of your right leg did not show any DVT.    You can wear compression stockings during the day to help with your swelling.  You can take over-the-counter ibuprofen to help with your pain.  Follow up with your PCP Dr. Tula Nakayama if your swelling persists.  Return to the emergency department if: You have: New or increased pain, swelling, or redness in an arm or leg. Numbness or tingling in an arm or leg. Shortness of breath. Chest pain. A rapid or irregular heartbeat. A severe headache or confusion. A cut that will not stop bleeding. There is blood in your vomit, stool, or urine. You have a serious fall or accident, or you hit your head. You feel light-headed or dizzy. You cough up blood.

## 2020-03-23 NOTE — ED Triage Notes (Signed)
Pt presents to ED with swelling in right foot and ankle x 2 weeks. Pt states she has had some swelling in the left foot and ankle but seems to be worse in the right foot. Pt denies injury.

## 2020-04-05 ENCOUNTER — Encounter: Payer: Self-pay | Admitting: Family Medicine

## 2020-04-05 NOTE — Telephone Encounter (Signed)
The ED physician said, "Patient to follow-up with PCP if lower leg swelling persists.  Patient was instructed to wear compression stockings during the day to help alleviate her swelling." They prescribed no medication and didn't mention her cholesterol as it wasn't related to her leg swelling.  If her legs are bothering her, we can set up an appointment for her.

## 2020-04-07 ENCOUNTER — Ambulatory Visit (INDEPENDENT_AMBULATORY_CARE_PROVIDER_SITE_OTHER): Payer: 59 | Admitting: Pulmonary Disease

## 2020-04-07 ENCOUNTER — Other Ambulatory Visit: Payer: Self-pay

## 2020-04-07 ENCOUNTER — Encounter: Payer: Self-pay | Admitting: Pulmonary Disease

## 2020-04-07 VITALS — BP 132/80 | HR 86 | Temp 97.2°F | Ht 60.0 in | Wt 176.0 lb

## 2020-04-07 DIAGNOSIS — R5382 Chronic fatigue, unspecified: Secondary | ICD-10-CM | POA: Diagnosis not present

## 2020-04-07 DIAGNOSIS — G4726 Circadian rhythm sleep disorder, shift work type: Secondary | ICD-10-CM

## 2020-04-07 DIAGNOSIS — R0683 Snoring: Secondary | ICD-10-CM

## 2020-04-07 NOTE — Patient Instructions (Signed)
Will arrange for home sleep study Will call to arrange for follow up after sleep study reviewed  

## 2020-04-07 NOTE — Progress Notes (Signed)
[place] [facility]  Chief Complaint  Patient presents with  . Consult    sleep consult    Constitutional:  BP 132/80 (BP Location: Left Arm, Cuff Size: Normal)   Pulse 86   Temp (!) 97.2 F (36.2 C) (Other (Comment)) Comment (Src): wrist  Ht 5' (1.524 m)   Wt 176 lb (79.8 kg)   LMP 03/20/2020   SpO2 100% Comment: Room air  BMI 34.37 kg/m   Past Medical History:  Anemia, Anxiety, Neck pain, Depression, HA, HTN  Past Surgical History:  Her  has a past surgical history that includes cervical cryotherapy (N/A, 1999); Laparoscopic bilateral salpingectomy (Bilateral, 08/26/2013); Dilatation & currettage/hysteroscopy with thermachoice ablation (08/26/2013); Vulvar lesion removal (08/26/2013); and Laparoscopic lysis of adhesions (08/26/2013).  Brief Summary:  Rebecca Boyd is a 44 y.o. female former smoker with snoring and fatigue.  She works 3rd shift.      Subjective:   She has been working 3rd shift for past 3 months.  Her sleep issues have been present prior to this.  She has noticed trouble for couple of years.  She can fall asleep, but then has trouble staying asleep.  She snores some.  She isn't sure if she stops breathing.  She is a restless sleeper and talks in her sleep.  Her work schedule is from 10 pm to 630 am.  She tries to go to sleep at 8 am, but has trouble staying asleep.  She estimates getting about 3 to 4 hours sleep per day.  She isn't using anything to help sleep or stay awake.    She denies sleep walking bruxism, or nightmares.  There is no history of restless legs.  She denies sleep hallucinations, sleep paralysis, or cataplexy.  The Epworth score is 5 out of 24.    Physical Exam:   Appearance - well kempt   ENMT - no sinus tenderness, no oral exudate, no LAN, Mallampati 2 airway, no stridor, 2+ tonsils, low laying soft palate  Respiratory - equal breath sounds bilaterally, no wheezing or rales  CV - s1s2  regular rate and rhythm, no murmurs  Ext - no clubbing, no edema  Skin - no rashes  Psych - normal mood and affect   Sleep Tests:    Social History:  She  reports that she quit smoking about 3 years ago. Her smoking use included cigars. She smoked 0.00 packs per day for 1.00 year. She has never used smokeless tobacco. She reports current alcohol use. She reports that she does not use drugs.  Family History:  Her family history includes Breast cancer in her paternal grandmother and sister; Colon cancer (age of onset: 54) in her cousin; Drug abuse in her mother; Hypertension in her father, maternal grandfather, maternal grandmother, and paternal grandmother.    Discussion:  She has snoring, sleep disruption, apnea, and daytime sleepiness.  She has history of hypertension and depression.  I am concerned she could have obstructive sleep apnea.  She also works 3rd shift, but her sleep issues pre-date this schedule.  Assessment/Plan:   Snoring with excessive daytime sleepiness and fatigue. - will need to arrange for a home sleep study  Shift work. - discussed importance of regular sleep wake schedule - proper sleep hygiene reviewed - discussed benefits of brief naps during wake schedule if she is able to   Obesity. - discussed how weight can impact sleep and risk for sleep disordered breathing - discussed options to assist  with weight loss: combination of diet modification, cardiovascular and strength training exercises  Cardiovascular risk. - had an extensive discussion regarding the adverse health consequences related to untreated sleep disordered breathing - specifically discussed the risks for hypertension, coronary artery disease, cardiac dysrhythmias, cerebrovascular disease, and diabetes - lifestyle modification discussed  Safe driving practices. - discussed how sleep disruption can increase risk of accidents, particularly when driving - safe driving practices were  discussed  Therapies for obstructive sleep apnea. - if the sleep study shows significant sleep apnea, then various therapies for treatment were reviewed: CPAP, oral appliance, and surgical interventions  Time Spent Involved in Patient Care on Day of Examination:  34 minutes  Follow up:  Patient Instructions  Will arrange for home sleep study Will call to arrange for follow up after sleep study reviewed    Medication List:   Allergies as of 04/07/2020      Reactions   Diphenhydramine Hcl Anaphylaxis   (Benadryl)   Orange Fruit Hives, Shortness Of Breath, Itching, Other (See Comments)   MAKES IT DIFFICULT TO BREATH   Latex Rash      Medication List       Accurate as of April 07, 2020 12:01 PM. If you have any questions, ask your nurse or doctor.        Aimovig 70 MG/ML Soaj Generic drug: Erenumab-aooe Inject 70 mg into the skin every 30 (thirty) days.   ALBUTEROL SULFATE HFA IN Inhale 2 puffs into the lungs.   amLODipine 10 MG tablet Commonly known as: NORVASC Take 1 tablet (10 mg total) by mouth daily.   cetirizine 10 MG tablet Commonly known as: ZyrTEC Allergy Take 1 tablet (10 mg total) by mouth daily.   ergocalciferol 1.25 MG (50000 UT) capsule Commonly known as: VITAMIN D2 Take 1 capsule (50,000 Units total) by mouth once a week. One capsule once weekly   escitalopram 10 MG tablet Commonly known as: Lexapro 5 mg daily for one week, then 10 mg daily   oxyCODONE-acetaminophen 5-325 MG tablet Commonly known as: PERCOCET/ROXICET Take 1-2 tablets by mouth 2 (two) times daily as needed.   sertraline 100 MG tablet Commonly known as: ZOLOFT Take 1 tablet (100 mg total) by mouth daily.   SUMAtriptan 6 MG/0.5ML Soln injection Commonly known as: Imitrex Use one injection at onset of migraine.  May repeat in 2 hrs, if needed.  Max dose: 2 inj/day. This is a 30 day prescription.   topiramate 100 MG tablet Commonly known as: TOPAMAX Take 1 tablet (100 mg  total) by mouth 2 (two) times daily.   triamterene-hydrochlorothiazide 75-50 MG tablet Commonly known as: Maxzide Take 1 tablet by mouth daily.   venlafaxine XR 75 MG 24 hr capsule Commonly known as: Effexor XR 75 mg daily. Start after completing 37.5 mg daily for one week       Signature:  Chesley Mires, MD [place] Pager - [phone] 04/07/2020, 12:01 PM

## 2020-04-08 NOTE — Progress Notes (Signed)
Virtual Visit via Telephone Note  I connected with Rebecca Boyd on 04/15/20 at  8:20 AM EST by telephone and verified that I am speaking with the correct person using two identifiers.  Location: Patient: home Provider: office Persons participated in the visit- patient, provider   I discussed the limitations, risks, security and privacy concerns of performing an evaluation and management service by telephone and the availability of in person appointments. I also discussed with the patient that there may be a patient responsible charge related to this service. The patient expressed understanding and agreed to proceed.  I discussed the assessment and treatment plan with the patient. The patient was provided an opportunity to ask questions and all were answered. The patient agreed with the plan and demonstrated an understanding of the instructions.   The patient was advised to call back or seek an in-person evaluation if the symptoms worsen or if the condition fails to improve as anticipated.  I provided 13 minutes of non-face-to-face time during this encounter.   [name], MD    [facility] MD/PA/NP OP Progress Note  04/15/2020 8:43 AM Rebecca Boyd  MRN:  [id number]  Chief Complaint:  Chief Complaint    Follow-up; Anxiety; Depression     HPI:  This is a follow-up appointment for depression and anxiety.  She states that she has not noticed any change since switching to venlafaxine.  She complains of leg edema after she started this medication.  She describes her day as "someday is good, and some day is not." Her work is "okay." She was "not excited" on Thanksgiving due to recent loss of her maternal uncle.  She states that it impacted her family as her family is very close with each other.  However, she denies any dwelling on his loss.  Her family at home is doing well otherwise.  She has insomnia.  She feels fatigue.  She has occasional difficulty in concentration.  Although she  reports weight gain, the record indicates that she lost a few pounds.  She denies SI.  She feels anxious and tense.  She reports her frustration that Xanax works better for her in the past, although it was discontinued. She reports frustration as she needed to switch medication around due to side effect, although she did not have any from xanax. Provided psychoeducation regarding the long-term risk of Xanax.  She agrees to try bupropion this time.   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 GAD (generalized anxiety disorder)  F41.1   2. MDD (major depressive disorder), recurrent episode, mild ([place])  F33.0     Past Psychiatric History: Please see initial evaluation for full details. I have reviewed the history. No updates at this time.     Past Medical History:  Past Medical History:  Diagnosis Date  . Anemia   . Anxiety   . Bacterial vaginosis 07/27/2014  . Cervical neck pain with evidence of disc disease 10/02/2013  . Depressive disorder, not elsewhere classified   . Family history of breast cancer   . Family history of breast cancer in sister 09/25/2018  . Family history of colon cancer   . Family history of lung cancer   . FH: breast cancer in relative when <61 years old 06/10/2018   Dx at  age 7  . Headache 03/16/2016  . 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 Hypertension   . Migraine   . Migraines   . Miscarriage 2009  . Neck pain   . Nondisplaced fracture of fifth metatarsal bone, left foot, subsequent encounter for fracture with routine healing 08/15/18 09/25/2018  . Pruritus 09/29/2018  . Pruritus ani 09/25/2018  . Rash and nonspecific skin eruption 05/14/2017  . Vaginitis    cyctitis    Past Surgical History:   Procedure Laterality Date  . cervical cryotherapy N/A 1999  . DILITATION & CURRETTAGE/HYSTROSCOPY WITH THERMACHOICE ABLATION  08/26/2013   Procedure: DILATATION & CURETTAGE/HYSTEROSCOPY WITH THERMACHOICE ENDOMETRIAL ABLATION Procedure #2 Total Therapy Time=min       sec;  Surgeon: Jonnie Kind, MD;  Location: [facility];  Service: Gynecology;;  . LAPAROSCOPIC BILATERAL SALPINGECTOMY Bilateral 08/26/2013   Procedure: LAPAROSCOPIC BILATERAL SALPINGECTOMY AND REMOVAL OF LEFT PERITUBAL CYST Procedure #1;  Surgeon: Jonnie Kind, MD;  Location: [facility];  Service: Gynecology;  Laterality: Bilateral;  . LAPAROSCOPIC LYSIS OF ADHESIONS  08/26/2013   Procedure: LAPAROSCOPIC LYSIS OF ADHESIONS Procedure #1;  Surgeon: Jonnie Kind, MD;  Location: [facility];  Service: Gynecology;;  . Carmelia Bake LESION REMOVAL  08/26/2013   Procedure: REMOVAL OF VULVAR SEBACEOUS CYST Procedure #3;  Surgeon: Jonnie Kind, MD;  Location: [facility];  Service: Gynecology;;    Family Psychiatric History: Please see initial evaluation for full details. I have reviewed the history. No updates at this time.     Family History:  Family History  Problem Relation Age of Onset  . Drug abuse Mother   . Hypertension Father   . Breast cancer Sister        dx 85  . Hypertension Maternal Grandmother   . Hypertension Maternal Grandfather   . Hypertension Paternal Grandmother   . Breast cancer Paternal Grandmother        dx late 73s  . Colon cancer Cousin 33    Social History:  Social History   Socioeconomic History  . Marital status: Single    Spouse name: Not on file  . Number of children: Not on file  . Years of education: Not on file  . Highest education level: Not on file  Occupational History  . Not on file  Tobacco Use  . Smoking status: Former Smoker    Packs/day: 0.00    Years: 1.00    Pack years: 0.00    Types: Cigars    Quit date: 01/05/2017    Years since quitting: 3.2  . Smokeless tobacco: Never Used  Vaping  Use  . Vaping Use: Never used  Substance and Sexual Activity  . Alcohol use: Yes    Comment: occasional  . Drug use: No  . Sexual activity: Yes    Birth control/protection: None  Other Topics Concern  . Not on file  Social History Narrative   Lives home with mother.  Works at [employer].  Education 10th grade/GED.  No children.  Single.     Social Determinants of Health   Financial Resource Strain: Not on file  Food Insecurity: Not on file  Transportation Needs: Not on file  Physical Activity: Not on file  Stress: Not on file  Social Connections: Not on file    Allergies:  Allergies  Allergen Reactions  . Diphenhydramine Hcl Anaphylaxis    (Benadryl)  . Orange Fruit Hives, Shortness Of Breath, Itching and Other (See Comments)  MAKES IT DIFFICULT TO BREATH  . Latex Rash    Metabolic Disorder Labs: Lab Results  Component Value Date   HGBA1C 5.3 03/18/2020   MPG 103 06/10/2018   MPG 108 05/29/2013   No results found for: PROLACTIN Lab Results  Component Value Date   CHOL 192 03/18/2020   TRIG 76 03/18/2020   HDL 45 03/18/2020   CHOLHDL 4.3 03/18/2020   VLDL 19 05/10/2016   LDLCALC 133 (H) 03/18/2020   LDLCALC 118 (H) 06/10/2018   Lab Results  Component Value Date   TSH 0.951 03/18/2020   TSH 1.67 06/10/2018    Therapeutic Level Labs: No results found for: LITHIUM No results found for: VALPROATE No components found for:  CBMZ  Current Medications: Current Outpatient Medications  Medication Sig Dispense Refill  . ALBUTEROL SULFATE HFA IN Inhale 2 puffs into the lungs.    Marland Kitchen amLODipine (NORVASC) 10 MG tablet Take 1 tablet (10 mg total) by mouth daily. 90 tablet 3  . buPROPion (WELLBUTRIN XL) 150 MG 24 hr tablet Take 1 tablet (150 mg total) by mouth daily. 30 tablet 1  . cetirizine (ZYRTEC ALLERGY) 10 MG tablet Take 1 tablet (10 mg total) by mouth daily. 30 tablet 1  . Erenumab-aooe (AIMOVIG) 70 MG/ML SOAJ Inject 70 mg into the skin every 30  (thirty) days. 1 pen 11  . ergocalciferol (VITAMIN D2) 1.25 MG (50000 UT) capsule Take 1 capsule (50,000 Units total) by mouth once a week. One capsule once weekly 12 capsule 2  . oxyCODONE-acetaminophen (PERCOCET/ROXICET) 5-325 MG tablet Take 1-2 tablets by mouth 2 (two) times daily as needed.     . SUMAtriptan (IMITREX) 6 MG/0.5ML SOLN injection Use one injection at onset of migraine.  May repeat in 2 hrs, if needed.  Max dose: 2 inj/day. This is a 30 day prescription. 5 mL 11  . topiramate (TOPAMAX) 100 MG tablet Take 1 tablet (100 mg total) by mouth 2 (two) times daily. 180 tablet 4  . triamterene-hydrochlorothiazide (MAXZIDE) 75-50 MG tablet Take 1 tablet by mouth daily. 30 tablet 4  . venlafaxine XR (EFFEXOR XR) 37.5 MG 24 hr capsule Take 1 capsule (37.5 mg total) by mouth daily with breakfast. 7 capsule 0   No current facility-administered medications for this visit.     Musculoskeletal: Strength & Muscle Tone: N/A Gait & Station: N/A Patient leans: N/A  Psychiatric Specialty Exam: Review of Systems  Psychiatric/Behavioral: Positive for decreased concentration and sleep disturbance. Negative for agitation, behavioral problems, confusion, dysphoric mood, hallucinations, self-injury and suicidal ideas. The patient is nervous/anxious. The patient is not hyperactive.   All other systems reviewed and are negative.   Last menstrual period 03/20/2020.There is no height or weight on file to calculate BMI.  General Appearance: NA  Eye Contact:  NA  Speech:  Clear and Coherent  Volume:  Normal  Mood:  Anxious  Affect:  NA  Thought Process:  Coherent  Orientation:  Full (Time, Place, and Person)  Thought Content: Logical   Suicidal Thoughts:  No  Homicidal Thoughts:  No  Memory:  Immediate;   Good  Judgement:  Good  Insight:  Fair  Psychomotor Activity:  Normal  Concentration:  Concentration: Good and Attention Span: Good  Recall:  Good  Fund of Knowledge: Good  Language: Good   Akathisia:  No  Handed:  Right  AIMS (if indicated): not done  Assets:  Communication Skills Desire for Improvement  ADL's:  Intact  Cognition: WNL  Sleep:  Poor   Screenings: GAD-7   Flowsheet Row Office Visit from 12/10/2019 in [facility] Office Visit from 10/28/2019 in [facility] Office Visit from 09/25/2019 in [facility] Office Visit from 05/21/2019 in [facility]  Total GAD-7 Score 14 15 6 18     PHQ2-9   [place] Visit from 03/18/2020 in [facility] Video Visit from 03/03/2020 in [place] Visit from 02/11/2020 in [place] Visit from 12/10/2019 in [place] Visit from 10/28/2019 in [facility]  PHQ-2 Total Score 2 1 0 2 2  PHQ-9 Total Score 5 -- -- 6 6       Assessment and Plan:  Rebecca Boyd is a 44 y.o. year old female with a history of  Depressive Disorder NOS and Psychotic Disorder NOS, hypertension,migraine, who presents for follow up appointment for below.   1. GAD (generalized anxiety disorder) 2. MDD (major depressive disorder), recurrent episode, mild ([place]) She continues to report anxiety and fatigue since the last visit.  Psychosocial stressors includes recent loss of her maternal uncle, and taking care of her family members with medical condition.  She complains of leg edema since starting venlafaxine.  Will taper off this medication.  Will plan to start bupropion once her leg edema resolves.  Discussed potential risk of headache, and palpitation.   Plan I have reviewed and updated plans as below 1.Change the medication as follows - Decrease venlafaxine 37.5 mg daily for one week, then discontinue - Start bupropion 150 mg daily  2.Next appointment- 1/27 at 9 AM for 20 mins, video - She has an upcoming appointment with neurologist for evaluation of sleep apnea later in the year - TSH was reportedly checked in late  2021; wnl  Past trials of medication: sertraline,fluoxetine(pruritis),lexapro (weight gain), venlafaxine, duloxetine (weakness),quetiapine, Xanax  The patient demonstrates the following risk factors for suicide: Chronic risk factors for suicide include:psychiatric disorder ofdepression. Acute risk factorsfor suicide include:loss (financial, interpersonal, professional). Protective factorsfor this patient include:coping skills and hope for the future. Considering these factors, the overall suicide risk at this point appears to below. Patientisappropriate for outpatient follow up.   [name], MD 04/15/2020, 8:43 AM

## 2020-04-12 ENCOUNTER — Other Ambulatory Visit: Payer: Self-pay | Admitting: *Deleted

## 2020-04-12 DIAGNOSIS — M7989 Other specified soft tissue disorders: Secondary | ICD-10-CM

## 2020-04-15 ENCOUNTER — Encounter: Payer: Self-pay | Admitting: Nurse Practitioner

## 2020-04-15 ENCOUNTER — Ambulatory Visit: Payer: 59

## 2020-04-15 ENCOUNTER — Telehealth (INDEPENDENT_AMBULATORY_CARE_PROVIDER_SITE_OTHER): Payer: 59 | Admitting: Psychiatry

## 2020-04-15 ENCOUNTER — Encounter: Payer: Self-pay | Admitting: Family Medicine

## 2020-04-15 ENCOUNTER — Other Ambulatory Visit: Payer: Self-pay

## 2020-04-15 ENCOUNTER — Encounter: Payer: Self-pay | Admitting: Psychiatry

## 2020-04-15 ENCOUNTER — Ambulatory Visit (INDEPENDENT_AMBULATORY_CARE_PROVIDER_SITE_OTHER): Payer: 59 | Admitting: Nurse Practitioner

## 2020-04-15 VITALS — BP 142/85 | HR 100 | Temp 99.0°F | Resp 18 | Ht 60.0 in | Wt 176.0 lb

## 2020-04-15 DIAGNOSIS — R5383 Other fatigue: Secondary | ICD-10-CM | POA: Insufficient documentation

## 2020-04-15 DIAGNOSIS — Z20822 Contact with and (suspected) exposure to covid-19: Secondary | ICD-10-CM

## 2020-04-15 DIAGNOSIS — F411 Generalized anxiety disorder: Secondary | ICD-10-CM

## 2020-04-15 DIAGNOSIS — F330 Major depressive disorder, recurrent, mild: Secondary | ICD-10-CM | POA: Diagnosis not present

## 2020-04-15 MED ORDER — BUPROPION HCL ER (XL) 150 MG PO TB24: 150.0000 mg | ORAL_TABLET | Freq: Every day | ORAL | 1 refills | Status: DC

## 2020-04-15 MED ORDER — VENLAFAXINE HCL ER 37.5 MG PO CP24: 37.5000 mg | ORAL_CAPSULE | Freq: Every day | ORAL | 0 refills | Status: DC

## 2020-04-15 NOTE — Telephone Encounter (Signed)
Pt made an appt with Pearline Cables at [facility] 

## 2020-04-15 NOTE — Assessment & Plan Note (Signed)
-  acute onset 1 day ago -will get COVID testing -if COVID is negative, will get routine labs and TSH -f/u based on COVID testing; if negative, will see her next week after getting labs -if positive, will set up for treatment

## 2020-04-15 NOTE — Patient Instructions (Signed)
1.Change the medication as follows - Decrease venlafaxine 37.5 mg daily for one week, then discontinue - Start bupropion 150 mg daily  2.Next appointment- 1/27 at 9 AM

## 2020-04-15 NOTE — Patient Instructions (Signed)
Fatigue - Primary     -acute onset 1 day ago -will get COVID testing -if COVID is negative, will get routine labs and TSH -f/u based on COVID testing; if negative, will see her next week after getting labs -if positive, will set up for treatment

## 2020-04-15 NOTE — Progress Notes (Signed)
Acute Office Visit  Subjective:    Patient ID: Rebecca Boyd, female    DOB: 10/27/1975, 44 y.o.   MRN: [id number]  Chief Complaint  Patient presents with   Fatigue    X 1 day    Dizziness    X 1 day    Shortness of Breath    X 1 day     HPI Patient is in today for fatigue, dizziness, and SOB.  Her SOB is worse with exertion. She has hx of asthma.  Denies fever, sinus pressure, nasal congestion, and cough. She will need a work note.  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   Initial dx is 05/2013    Hypertension    Migraine    Migraines    Miscarriage 2009   Neck pain    Nondisplaced fracture of fifth metatarsal bone, left foot, subsequent encounter for fracture with routine healing 08/15/18 09/25/2018   Pruritus 09/29/2018   Pruritus ani 09/25/2018   Rash and nonspecific skin eruption 05/14/2017   Vaginitis    cyctitis    Past Surgical History:  Procedure Laterality Date   cervical cryotherapy N/A 1999   DILITATION & CURRETTAGE/HYSTROSCOPY WITH THERMACHOICE ABLATION  08/26/2013   Procedure: DILATATION & CURETTAGE/HYSTEROSCOPY WITH THERMACHOICE ENDOMETRIAL ABLATION Procedure #2 Total Therapy Time=min       sec;  Surgeon: Jonnie Kind, MD;  Location: [facility];  Service: Gynecology;;   LAPAROSCOPIC BILATERAL SALPINGECTOMY Bilateral 08/26/2013   Procedure: LAPAROSCOPIC BILATERAL SALPINGECTOMY AND REMOVAL OF LEFT PERITUBAL CYST Procedure #1;  Surgeon: Jonnie Kind, MD;  Location: [facility];   Service: Gynecology;  Laterality: Bilateral;   LAPAROSCOPIC LYSIS OF ADHESIONS  08/26/2013   Procedure: LAPAROSCOPIC LYSIS OF ADHESIONS Procedure #1;  Surgeon: Jonnie Kind, MD;  Location: [facility];  Service: Gynecology;;   VULVAR LESION REMOVAL  08/26/2013   Procedure: REMOVAL OF VULVAR SEBACEOUS CYST Procedure #3;  Surgeon: Jonnie Kind, MD;  Location: [facility];  Service: Gynecology;;    Family History  Problem Relation Age of Onset   Drug abuse Mother    Hypertension Father    Breast cancer Sister        dx 44   Hypertension Maternal Grandmother    Hypertension Maternal Grandfather    Hypertension Paternal Grandmother    Breast cancer Paternal Grandmother        dx late 75s   Colon cancer Cousin 78    Social History   Socioeconomic History   Marital status: Single    Spouse name: Not on file   Number of children: Not on file   Years of education: Not on file   Highest education level: Not on file  Occupational History   Not on file  Tobacco Use   Smoking status: Former Smoker    Packs/day: 0.00    Years: 1.00    Pack years: 0.00    Types: Cigars    Quit date:  01/05/2017    Years since quitting: 3.2   Smokeless tobacco: Never Used  Vaping Use   Vaping Use: Never used  Substance and Sexual Activity   Alcohol use: Yes    Comment: occasional   Drug use: No   Sexual activity: Yes    Birth control/protection: None  Other Topics Concern   Not on file  Social History Narrative   Lives home with mother.  Works at [employer].  Education 10th grade/GED.  No children.  Single.     Social Determinants of Health   Financial Resource Strain: Not on file  Food Insecurity: Not on file  Transportation Needs: Not on file  Physical Activity: Not on file  Stress: Not on file  Social Connections: Not on file  Intimate Partner Violence: Not on file    Outpatient Medications Prior to Visit  Medication Sig Dispense Refill   ALBUTEROL SULFATE  HFA IN Inhale 2 puffs into the lungs.     amLODipine (NORVASC) 10 MG tablet Take 1 tablet (10 mg total) by mouth daily. 90 tablet 3   buPROPion (WELLBUTRIN XL) 150 MG 24 hr tablet Take 1 tablet (150 mg total) by mouth daily. 30 tablet 1   cetirizine (ZYRTEC ALLERGY) 10 MG tablet Take 1 tablet (10 mg total) by mouth daily. 30 tablet 1   Erenumab-aooe (AIMOVIG) 70 MG/ML SOAJ Inject 70 mg into the skin every 30 (thirty) days. 1 pen 11   ergocalciferol (VITAMIN D2) 1.25 MG (50000 UT) capsule Take 1 capsule (50,000 Units total) by mouth once a week. One capsule once weekly 12 capsule 2   oxyCODONE-acetaminophen (PERCOCET/ROXICET) 5-325 MG tablet Take 1-2 tablets by mouth 2 (two) times daily as needed.      SUMAtriptan (IMITREX) 6 MG/0.5ML SOLN injection Use one injection at onset of migraine.  May repeat in 2 hrs, if needed.  Max dose: 2 inj/day. This is a 30 day prescription. 5 mL 11   topiramate (TOPAMAX) 100 MG tablet Take 1 tablet (100 mg total) by mouth 2 (two) times daily. 180 tablet 4   triamterene-hydrochlorothiazide (MAXZIDE) 75-50 MG tablet Take 1 tablet by mouth daily. 30 tablet 4   venlafaxine XR (EFFEXOR XR) 37.5 MG 24 hr capsule Take 1 capsule (37.5 mg total) by mouth daily with breakfast. 7 capsule 0   No facility-administered medications prior to visit.    Allergies  Allergen Reactions   Diphenhydramine Hcl Anaphylaxis    (Benadryl)   Orange Fruit Hives, Shortness Of Breath, Itching and Other (See Comments)    MAKES IT DIFFICULT TO BREATH   Latex Rash    Review of Systems  Constitutional: Positive for fatigue. Negative for chills and fever.  Respiratory: Positive for shortness of breath. Negative for cough, chest tightness and wheezing.   Cardiovascular: Negative.        Objective:    Physical Exam  BP (!) 142/85    Pulse 100    Temp 99 F (37.2 C)    Resp 18    Ht 5' (1.524 m)    Wt 176 lb (79.8 kg)    LMP 03/20/2020    SpO2 96%    BMI 34.37 kg/m  Wt  Readings from Last 3 Encounters:  04/15/20 176 lb (79.8 kg)  04/07/20 176 lb (79.8 kg)  03/23/20 175 lb (79.4 kg)    Health Maintenance Due  Topic Date Due   COVID-19 Vaccine (1) Never done    There are no preventive care reminders to display  for this patient.   Lab Results  Component Value Date   TSH 0.951 03/18/2020   Lab Results  Component Value Date   WBC 6.0 03/18/2020   HGB 13.9 03/18/2020   HCT 41.7 03/18/2020   MCV 89 03/18/2020   PLT 217 03/18/2020   Lab Results  Component Value Date   NA 136 03/23/2020   K 3.1 (L) 03/23/2020   CO2 24 03/23/2020   GLUCOSE 96 03/23/2020   BUN 9 03/23/2020   CREATININE 0.65 03/23/2020   BILITOT 0.8 07/11/2019   ALKPHOS 67 07/11/2019   AST 15 07/11/2019   ALT 21 07/11/2019   PROT 6.7 07/11/2019   ALBUMIN 3.6 07/11/2019   CALCIUM 9.1 03/23/2020   ANIONGAP 10 03/23/2020   Lab Results  Component Value Date   CHOL 192 03/18/2020   Lab Results  Component Value Date   HDL 45 03/18/2020   Lab Results  Component Value Date   LDLCALC 133 (H) 03/18/2020   Lab Results  Component Value Date   TRIG 76 03/18/2020   Lab Results  Component Value Date   CHOLHDL 4.3 03/18/2020   Lab Results  Component Value Date   HGBA1C 5.3 03/18/2020       Assessment & Plan:   Problem List Items Addressed This Visit      Other   Fatigue - Primary    -acute onset 1 day ago -will get COVID testing -if COVID is negative, will get routine labs and TSH -f/u based on COVID testing; if negative, will see her next week after getting labs -if positive, will set up for treatment          No orders of the defined types were placed in this encounter. Date:  04/15/2020   Location of Patient: Home Location of Provider: Office Consent was obtain for visit to be over via telehealth. I verified that I am speaking with the correct person using two identifiers.  I connected with  [name] on 04/15/20 via telephone and  verified that I am speaking with the correct person using two identifiers.   I discussed the limitations of evaluation and management by telemedicine. The patient expressed understanding and agreed to proceed.  Time spent: 15 minutes Work note provided through the end of the week d/t COVID testing  Noreene Larsson, NP

## 2020-04-17 LAB — SPECIMEN STATUS REPORT

## 2020-04-17 LAB — NOVEL CORONAVIRUS, NAA: SARS-CoV-2, NAA: NOT DETECTED

## 2020-04-17 LAB — SARS-COV-2, NAA 2 DAY TAT

## 2020-04-21 ENCOUNTER — Ambulatory Visit: Payer: 59

## 2020-04-21 ENCOUNTER — Other Ambulatory Visit: Payer: Self-pay

## 2020-04-21 DIAGNOSIS — G4733 Obstructive sleep apnea (adult) (pediatric): Secondary | ICD-10-CM

## 2020-04-21 DIAGNOSIS — R0683 Snoring: Secondary | ICD-10-CM

## 2020-04-21 DIAGNOSIS — R5382 Chronic fatigue, unspecified: Secondary | ICD-10-CM

## 2020-04-22 ENCOUNTER — Other Ambulatory Visit: Payer: Self-pay

## 2020-04-22 ENCOUNTER — Ambulatory Visit (HOSPITAL_COMMUNITY)
Admission: RE | Admit: 2020-04-22 | Discharge: 2020-04-22 | Disposition: A | Discharge status: Home / Self Care | Payer: 59 | Source: Ambulatory Visit | Attending: Vascular Surgery | Admitting: Vascular Surgery

## 2020-04-22 ENCOUNTER — Ambulatory Visit (INDEPENDENT_AMBULATORY_CARE_PROVIDER_SITE_OTHER): Payer: 59 | Admitting: Physician Assistant

## 2020-04-22 VITALS — BP 124/89 | HR 75 | Temp 98.3°F | Resp 20 | Ht 60.0 in | Wt 176.9 lb

## 2020-04-22 DIAGNOSIS — M7989 Other specified soft tissue disorders: Secondary | ICD-10-CM

## 2020-04-22 NOTE — Progress Notes (Signed)
[facility]   Reason for referral: Swollen right > left leg  History of Present Illness  Rebecca Boyd is a 44 y.o. female who presents with chief complaint: swollen leg.  Patient notes, onset of swelling 3 weeks  ago, associated with prolonged sitting and standing.  The patient has had no history of DVT, no history of varicose vein, no history of venous stasis ulcers, no history of  Lymphedema and no history of skin changes in lower legs.  There is no family history of venous disorders.  The patient has  used compression stockings in the past.  She states she has not had a recent injury or surgery on lower extremities. The swelling started 2-3 weeks ago.  She states her legs are not swollen first thing in the morning.  Past Medical History:  Diagnosis Date  . Anemia   . Anxiety   . Bacterial vaginosis 07/27/2014  . Cervical neck pain with evidence of disc disease 10/02/2013  . Depressive disorder, not elsewhere classified   . Family history of breast cancer   . Family history of breast cancer in sister 09/25/2018  . Family history of colon cancer   . Family history of lung cancer   . FH: breast cancer in relative when <32 years old 06/10/2018   Dx at age 60  . Headache 03/16/2016  . 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 Hypertension   . Migraine   . Migraines   . Miscarriage 2009  . Neck pain   . Nondisplaced fracture of fifth metatarsal bone, left foot, subsequent encounter for fracture with routine healing 08/15/18 09/25/2018  . Pruritus 09/29/2018  . Pruritus ani 09/25/2018  . Rash and nonspecific skin eruption 05/14/2017  . Vaginitis    cyctitis    Past Surgical History:  Procedure Laterality Date  . cervical cryotherapy N/A 1999  . DILITATION & CURRETTAGE/HYSTROSCOPY WITH THERMACHOICE ABLATION  08/26/2013   Procedure: DILATATION & CURETTAGE/HYSTEROSCOPY WITH THERMACHOICE ENDOMETRIAL ABLATION Procedure #2 Total Therapy Time=min        sec;  Surgeon: Jonnie Kind, MD;  Location: [facility];  Service: Gynecology;;  . LAPAROSCOPIC BILATERAL SALPINGECTOMY Bilateral 08/26/2013   Procedure: LAPAROSCOPIC BILATERAL SALPINGECTOMY AND REMOVAL OF LEFT PERITUBAL CYST Procedure #1;  Surgeon: Jonnie Kind, MD;  Location: [facility];  Service: Gynecology;  Laterality: Bilateral;  . LAPAROSCOPIC LYSIS OF ADHESIONS  08/26/2013   Procedure: LAPAROSCOPIC LYSIS OF ADHESIONS Procedure #1;  Surgeon: Jonnie Kind, MD;  Location: [facility];  Service: Gynecology;;  . Carmelia Bake LESION REMOVAL  08/26/2013   Procedure: REMOVAL OF VULVAR SEBACEOUS CYST Procedure #3;  Surgeon: Jonnie Kind, MD;  Location: [facility];  Service: Gynecology;;    Social History   Socioeconomic History  . Marital status: Single    Spouse name: Not on file  . Number of children: Not on file  . Years of education: Not on file  . Highest education level: Not on file  Occupational History  . Not on file  Tobacco Use  . Smoking status: Former Smoker    Packs/day: 0.00    Years: 1.00    Pack years: 0.00    Types: Cigars    Quit date: 01/05/2017    Years since quitting: 3.2  . Smokeless tobacco: Never Used  Vaping Use  . Vaping Use: Never used  Substance and Sexual Activity  . Alcohol use: Yes    Comment: occasional  .  Drug use: No  . Sexual activity: Yes    Birth control/protection: None  Other Topics Concern  . Not on file  Social History Narrative   Lives home with mother.  Works at [employer].  Education 10th grade/GED.  No children.  Single.     Social Determinants of Health   Financial Resource Strain: Not on file  Food Insecurity: Not on file  Transportation Needs: Not on file  Physical Activity: Not on file  Stress: Not on file  Social Connections: Not on file  Intimate Partner Violence: Not on file    Family History  Problem Relation Age of Onset  . Drug abuse Mother   . Hypertension Father   . Breast cancer Sister        dx 67  .  Hypertension Maternal Grandmother   . Hypertension Maternal Grandfather   . Hypertension Paternal Grandmother   . Breast cancer Paternal Grandmother        dx late 34s  . Colon cancer Cousin 62    Current Outpatient Medications on File Prior to Visit  Medication Sig Dispense Refill  . ALBUTEROL SULFATE HFA IN Inhale 2 puffs into the lungs.    Marland Kitchen amLODipine (NORVASC) 10 MG tablet Take 1 tablet (10 mg total) by mouth daily. 90 tablet 3  . buPROPion (WELLBUTRIN XL) 150 MG 24 hr tablet Take 1 tablet (150 mg total) by mouth daily. 30 tablet 1  . cetirizine (ZYRTEC ALLERGY) 10 MG tablet Take 1 tablet (10 mg total) by mouth daily. 30 tablet 1  . Erenumab-aooe (AIMOVIG) 70 MG/ML SOAJ Inject 70 mg into the skin every 30 (thirty) days. 1 pen 11  . ergocalciferol (VITAMIN D2) 1.25 MG (50000 UT) capsule Take 1 capsule (50,000 Units total) by mouth once a week. One capsule once weekly 12 capsule 2  . oxyCODONE-acetaminophen (PERCOCET/ROXICET) 5-325 MG tablet Take 1-2 tablets by mouth 2 (two) times daily as needed.     . SUMAtriptan (IMITREX) 6 MG/0.5ML SOLN injection Use one injection at onset of migraine.  May repeat in 2 hrs, if needed.  Max dose: 2 inj/day. This is a 30 day prescription. 5 mL 11  . topiramate (TOPAMAX) 100 MG tablet Take 1 tablet (100 mg total) by mouth 2 (two) times daily. 180 tablet 4  . triamterene-hydrochlorothiazide (MAXZIDE) 75-50 MG tablet Take 1 tablet by mouth daily. 30 tablet 4  . venlafaxine XR (EFFEXOR XR) 37.5 MG 24 hr capsule Take 1 capsule (37.5 mg total) by mouth daily with breakfast. 7 capsule 0  . [DISCONTINUED] fluticasone (FLONASE) 50 MCG/ACT nasal spray Place 2 sprays into both nostrils daily. 16 g 2   No current facility-administered medications on file prior to visit.    Allergies as of 04/22/2020 - Review Complete 04/22/2020  Allergen Reaction Noted  . Diphenhydramine hcl Anaphylaxis 07/04/2007  . Orange fruit Hives, Shortness Of Breath, Itching, and Other  (See Comments) 11/24/2010  . Latex Rash 11/03/2013     ROS:   General:  No weight loss, Fever, chills  HEENT: No recent headaches, no nasal bleeding, no visual changes, no sore throat  Neurologic: No dizziness, blackouts, seizures. No recent symptoms of stroke or mini- stroke. No recent episodes of slurred speech, or temporary blindness.  Cardiac: No recent episodes of chest pain/pressure, no shortness of breath at rest.  No shortness of breath with exertion.  Denies history of atrial fibrillation or irregular heartbeat  Vascular: No history of rest pain in feet.  No history  of claudication.  No history of non-healing ulcer, No history of DVT   Pulmonary: No home oxygen, no productive cough, no hemoptysis,  No asthma or wheezing  Musculoskeletal:  [ ]  Arthritis, [ ]  Low back pain,  [ ]  Joint pain  Hematologic:No history of hypercoagulable state.  No history of easy bleeding.  No history of anemia  Gastrointestinal: No hematochezia or melena,  No gastroesophageal reflux, no trouble swallowing  Urinary: [ ]  chronic Kidney disease, [ ]  on HD - [ ]  MWF or [ ]  TTHS, [ ]  Burning with urination, [ ]  Frequent urination, [ ]  Difficulty urinating;   Skin: No rashes  Psychological: No history of anxiety,  No history of depression  Physical Examination  Vitals:   04/22/20 1034  BP: 124/89  Pulse: 75  Resp: 20  Temp: 98.3 F (36.8 C)  SpO2: 97%  Weight: 176 lb 14.4 oz (80.2 kg)  Height: 5' (1.524 m)    Body mass index is 34.55 kg/m.  General:  Alert and oriented, no acute distress HEENT: Normal Neck: No bruit or JVD Pulmonary: Clear to auscultation bilaterally Cardiac: Regular Rate and Rhythm without murmur Abdomen: Soft, non-tender, non-distended, no mass, no scars Skin: No rash Extremity Pulses:  2+ radial, brachial, femoral, dorsalis pedis, posterior tibial pulses bilaterally Musculoskeletal: mild right LE edema  Neurologic: Upper and lower extremity motor 5/5 and  symmetric  DATA: Venous Reflux Times  +--------------+---------+------+-----------+------------+--------+  RIGHT     Reflux NoRefluxReflux TimeDiameter cmsComments               Yes                   +--------------+---------+------+-----------+------------+--------+  CFV      no                         +--------------+---------+------+-----------+------------+--------+  FV prox    no                         +--------------+---------+------+-----------+------------+--------+  FV mid    no                         +--------------+---------+------+-----------+------------+--------+  FV dist    no                         +--------------+---------+------+-----------+------------+--------+  Popliteal   no                         +--------------+---------+------+-----------+------------+--------+  GSV at SFJ        yes         0.569        +--------------+---------+------+-----------+------------+--------+  GSV prox thighno               0.410        +--------------+---------+------+-----------+------------+--------+  GSV mid thigh no               0.328        +--------------+---------+------+-----------+------------+--------+  GSV dist thighno               0.247        +--------------+---------+------+-----------+------------+--------+  GSV at knee  no               0.260        +--------------+---------+------+-----------+------------+--------+  GSV prox calf  0.233        +--------------+---------+------+-----------+------------+--------+  GSV mid calf                 0.219         +--------------+---------+------+-----------+------------+--------+  SSV Pop Fossa no               0.146        +--------------+---------+------+-----------+------------+--------+  SSV prox calf no               0.164        +--------------+---------+------+-----------+------------+--------+  SSV mid calf                 0.173        +--------------+---------+------+-----------+------------+--------+     +--------------+---------+------+-----------+------------+--------+  LEFT     Reflux NoRefluxReflux TimeDiameter cmsComments               Yes                   +--------------+---------+------+-----------+------------+--------+  CFV            yes  >1 second             +--------------+---------+------+-----------+------------+--------+  FV prox    no                         +--------------+---------+------+-----------+------------+--------+  FV mid    no                         +--------------+---------+------+-----------+------------+--------+  FV dist    no                         +--------------+---------+------+-----------+------------+--------+  Popliteal   no                         +--------------+---------+------+-----------+------------+--------+  GSV at SFJ  no               0.461        +--------------+---------+------+-----------+------------+--------+  GSV prox thighno               0.447        +--------------+---------+------+-----------+------------+--------+  GSV mid thigh no               0.265        +--------------+---------+------+-----------+------------+--------+  GSV dist thighno                0.265        +--------------+---------+------+-----------+------------+--------+  GSV at knee  no               0.258        +--------------+---------+------+-----------+------------+--------+  GSV prox calf                 0.169        +--------------+---------+------+-----------+------------+--------+  GSV mid calf                 0.178        +--------------+---------+------+-----------+------------+--------+  SSV Pop Fossa no               0.324        +--------------+---------+------+-----------+------------+--------+  SSV prox calf no               0.183        +--------------+---------+------+-----------+------------+--------+  SSV mid calf  0.202        +--------------+---------+------+-----------+------------+--------+    Summary:  Bilateral:  - No evidence of deep vein thrombosis seen in the lower extremities,  bilaterally, from the common femoral through the popliteal veins.  - No evidence of superficial venous thrombosis in the lower extremities,  bilaterally.  - No evidence of superficial venous reflux seen in the short saphenous  veins bilaterally.       Assessment: Right LE edema of unknown cause Venous reflux limited Right:  - Venous reflux is noted in the right sapheno-femoral junction.    Left:  - Venous reflux is noted in the left common femoral vein.  Vein size below the reflux does not indicate enlarged veins.  Plan: I placed her in mild knee high compression 15-20 mm hg She will avoid prolonged sitting and standing, elevation when at rest and exercise program.    Roxy Horseman PA-C [facility] Office: [phone]  MD in clinic [place]

## 2020-04-26 ENCOUNTER — Telehealth: Payer: Self-pay

## 2020-04-26 ENCOUNTER — Encounter: Payer: Self-pay | Admitting: Family Medicine

## 2020-04-26 ENCOUNTER — Encounter: Payer: Self-pay | Admitting: Nurse Practitioner

## 2020-04-26 ENCOUNTER — Other Ambulatory Visit: Payer: Self-pay

## 2020-04-26 ENCOUNTER — Ambulatory Visit (INDEPENDENT_AMBULATORY_CARE_PROVIDER_SITE_OTHER): Payer: 59 | Admitting: Nurse Practitioner

## 2020-04-26 VITALS — BP 128/85 | HR 84 | Temp 98.9°F | Resp 18 | Ht 60.0 in | Wt 177.0 lb

## 2020-04-26 DIAGNOSIS — M25571 Pain in right ankle and joints of right foot: Secondary | ICD-10-CM | POA: Diagnosis not present

## 2020-04-26 DIAGNOSIS — M109 Gout, unspecified: Secondary | ICD-10-CM

## 2020-04-26 MED ORDER — COLCHICINE 0.6 MG PO TABS
0.6000 mg | ORAL_TABLET | ORAL | 0 refills | Status: DC
Start: 2020-04-26 — End: 2020-10-23

## 2020-04-26 MED ORDER — INDOMETHACIN 50 MG PO CAPS: 50.0000 mg | ORAL_CAPSULE | Freq: Three times a day (TID) | ORAL | 0 refills | Status: DC

## 2020-04-26 NOTE — Telephone Encounter (Signed)
Spoke with pt, appt made.

## 2020-04-26 NOTE — Patient Instructions (Signed)
For your ankle pain, you are being treated for gout. We will check a uric acid level to attempt to confirm the diagnosis of gout, however, a serum uric acid level is not as reliable as joint aspiration (drawing fluid). If you are still having ankle pain in a week despite taking these medications, please let us know and we can consider an orthopedic consult at that time.

## 2020-04-26 NOTE — Assessment & Plan Note (Signed)
-  has hx of swelling; saw vascular and she states she has been wearing compression hose and elevating her leg -no f/u with vascular planned; pt states that vascular surgeon says her leg swelling is not a vascular issue -today her right ankle is swollen and hot; no mechanism of injury -will check uric acid level -Rx. Colchicine and indomethacin -if no improvement in a week, will consider ortho consult

## 2020-04-26 NOTE — Telephone Encounter (Signed)
I have submitted a PA request for Aimovig 70mg  on CMM, Key: [id number].   Awaiting determination.

## 2020-04-26 NOTE — Progress Notes (Signed)
Acute Office Visit  Subjective:    Patient ID: Rebecca Boyd, female    DOB: 08/05/1975, 44 y.o.   MRN: [id number]  Chief Complaint  Patient presents with  . Foot Swelling    R foot Painful x 1 month; worsening     HPI Patient is in today for right lower leg swelling and pain that has been ongoing for a month. She was referred to Vascular surgery and they ordered mid knee high compression 15-20 mmHg stockings as well as avoiding prolonged sitting or standing and elevation when at rest and to start an exercise program.  She had avenous reflux in the right sapheno-femoral junction as well as reflux in the left common femoral vein.  Past Medical History:  Diagnosis Date  . Anemia   . Anxiety   . Bacterial vaginosis 07/27/2014  . Cervical neck pain with evidence of disc disease 10/02/2013  . Depressive disorder, not elsewhere classified   . Family history of breast cancer   . Family history of breast cancer in sister 09/25/2018  . Family history of colon cancer   . Family history of lung cancer   . FH: breast cancer in relative when <44 years old 06/10/2018   Dx at age 44  . Headache 03/16/2016  . 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 Hypertension   . Migraine   . Migraines   . Miscarriage 2009  . Neck pain   . Nondisplaced fracture of fifth metatarsal bone, left foot, subsequent encounter for fracture with routine healing 08/15/18 09/25/2018  . Pruritus 09/29/2018  . Pruritus ani 09/25/2018  . Rash and nonspecific skin eruption 05/14/2017  . Vaginitis    cyctitis    Past Surgical History:  Procedure Laterality Date  . cervical cryotherapy N/A 1999  . DILITATION & CURRETTAGE/HYSTROSCOPY WITH THERMACHOICE ABLATION  08/26/2013   Procedure: DILATATION & CURETTAGE/HYSTEROSCOPY WITH THERMACHOICE ENDOMETRIAL ABLATION Procedure #2 Total Therapy Time=min       sec;  Surgeon: Tilda BurrowJohn V Ferguson, MD;  Location: [facility];  Service: Gynecology;;  . LAPAROSCOPIC BILATERAL  SALPINGECTOMY Bilateral 08/26/2013   Procedure: LAPAROSCOPIC BILATERAL SALPINGECTOMY AND REMOVAL OF LEFT PERITUBAL CYST Procedure #1;  Surgeon: Tilda BurrowJohn V Ferguson, MD;  Location: [facility];  Service: Gynecology;  Laterality: Bilateral;  . LAPAROSCOPIC LYSIS OF ADHESIONS  08/26/2013   Procedure: LAPAROSCOPIC LYSIS OF ADHESIONS Procedure #1;  Surgeon: Tilda BurrowJohn V Ferguson, MD;  Location: [facility];  Service: Gynecology;;  . Horald PollenVULVAR LESION REMOVAL  08/26/2013   Procedure: REMOVAL OF VULVAR SEBACEOUS CYST Procedure #3;  Surgeon: Tilda BurrowJohn V Ferguson, MD;  Location: [facility];  Service: Gynecology;;    Family History  Problem Relation Age of Onset  . Drug abuse Mother   . Hypertension Father   . Breast cancer Sister        dx [id number]  . Hypertension Maternal Grandmother   . Hypertension Maternal Grandfather   . Hypertension Paternal Grandmother   . Breast cancer Paternal Grandmother        dx late 6570s  . Colon cancer Cousin [id number]    Social History   Socioeconomic History  . Marital status: Single    Spouse name: Not on file  . Number of children: Not on file  . Years of education: Not on file  . Highest education level: Not on file  Occupational History  . Not on file  Tobacco Use  . Smoking status: Former Smoker    Packs/day: 0.00  Years: 1.00    Pack years: 0.00    Types: Cigars    Quit date: 01/05/2017    Years since quitting: 3.3  . Smokeless tobacco: Never Used  Vaping Use  . Vaping Use: Never used  Substance and Sexual Activity  . Alcohol use: Yes    Comment: occasional  . Drug use: No  . Sexual activity: Yes    Birth control/protection: None  Other Topics Concern  . Not on file  Social History Narrative   Lives home with mother.  Works at [employer].  Education 10th grade/GED.  No children.  Single.     Social Determinants of Health   Financial Resource Strain: Not on file  Food Insecurity: Not on file  Transportation Needs: Not on file  Physical Activity: Not on file  Stress:  Not on file  Social Connections: Not on file  Intimate Partner Violence: Not on file    Outpatient Medications Prior to Visit  Medication Sig Dispense Refill  . ALBUTEROL SULFATE HFA IN Inhale 2 puffs into the lungs.    Marland Kitchen amLODipine (NORVASC) 10 MG tablet Take 1 tablet (10 mg total) by mouth daily. 90 tablet 3  . buPROPion (WELLBUTRIN XL) 150 MG 24 hr tablet Take 1 tablet (150 mg total) by mouth daily. 30 tablet 1  . cetirizine (ZYRTEC ALLERGY) 10 MG tablet Take 1 tablet (10 mg total) by mouth daily. 30 tablet 1  . Erenumab-aooe (AIMOVIG) 70 MG/ML SOAJ Inject 70 mg into the skin every 30 (thirty) days. 1 pen 11  . ergocalciferol (VITAMIN D2) 1.25 MG (50000 UT) capsule Take 1 capsule (50,000 Units total) by mouth once a week. One capsule once weekly 12 capsule 2  . oxyCODONE-acetaminophen (PERCOCET/ROXICET) 5-325 MG tablet Take 1-2 tablets by mouth 2 (two) times daily as needed.     . SUMAtriptan (IMITREX) 6 MG/0.5ML SOLN injection Use one injection at onset of migraine.  May repeat in 2 hrs, if needed.  Max dose: 2 inj/day. This is a 30 day prescription. 5 mL 11  . topiramate (TOPAMAX) 100 MG tablet Take 1 tablet (100 mg total) by mouth 2 (two) times daily. 180 tablet 4  . triamterene-hydrochlorothiazide (MAXZIDE) 75-50 MG tablet Take 1 tablet by mouth daily. 30 tablet 4  . venlafaxine XR (EFFEXOR XR) 37.5 MG 24 hr capsule Take 1 capsule (37.5 mg total) by mouth daily with breakfast. 7 capsule 0   No facility-administered medications prior to visit.    Allergies  Allergen Reactions  . Diphenhydramine Hcl Anaphylaxis    (Benadryl)  . Orange Fruit Hives, Shortness Of Breath, Itching and Other (See Comments)    MAKES IT DIFFICULT TO BREATH  . Latex Rash    Review of Systems  Constitutional: Negative.   Musculoskeletal: Positive for arthralgias.       Right ankle pain and swelling       Objective:    Physical Exam Constitutional:      Appearance: She is obese.  Musculoskeletal:         General: Swelling and tenderness present.     Right lower leg: Edema present.     Comments: Right ankle edema; hot to touch  Neurological:     Mental Status: She is alert.     BP 128/85   Pulse 84   Temp 98.9 F (37.2 C)   Resp 18   Ht 5' (1.524 m)   Wt 177 lb (80.3 kg)   SpO2 98%   BMI 34.57  kg/m  Wt Readings from Last 3 Encounters:  04/26/20 177 lb (80.3 kg)  04/22/20 176 lb 14.4 oz (80.2 kg)  04/15/20 176 lb (79.8 kg)    Health Maintenance Due  Topic Date Due  . COVID-19 Vaccine (1) Never done    There are no preventive care reminders to display for this patient.   Lab Results  Component Value Date   TSH 0.951 03/18/2020   Lab Results  Component Value Date   WBC 6.0 03/18/2020   HGB 13.9 03/18/2020   HCT 41.7 03/18/2020   MCV 89 03/18/2020   PLT 217 03/18/2020   Lab Results  Component Value Date   NA 136 03/23/2020   K 3.1 (L) 03/23/2020   CO2 24 03/23/2020   GLUCOSE 96 03/23/2020   BUN 9 03/23/2020   CREATININE 0.65 03/23/2020   BILITOT 0.8 07/11/2019   ALKPHOS 67 07/11/2019   AST 15 07/11/2019   ALT 21 07/11/2019   PROT 6.7 07/11/2019   ALBUMIN 3.6 07/11/2019   CALCIUM 9.1 03/23/2020   ANIONGAP 10 03/23/2020   Lab Results  Component Value Date   CHOL 192 03/18/2020   Lab Results  Component Value Date   HDL 45 03/18/2020   Lab Results  Component Value Date   LDLCALC 133 (H) 03/18/2020   Lab Results  Component Value Date   TRIG 76 03/18/2020   Lab Results  Component Value Date   CHOLHDL 4.3 03/18/2020   Lab Results  Component Value Date   HGBA1C 5.3 03/18/2020       Assessment & Plan:   Problem List Items Addressed This Visit      Other   Acute right ankle pain    -has hx of swelling; saw vascular and she states she has been wearing compression hose and elevating her leg -no f/u with vascular planned; pt states that vascular surgeon says her leg swelling is not a vascular issue -today her right ankle is swollen  and hot; no mechanism of injury -will check uric acid level -Rx. Colchicine and indomethacin -if no improvement in a week, will consider ortho consult      Relevant Orders   Uric acid    Other Visit Diagnoses    Acute gout of right ankle, unspecified cause    -  Primary   Relevant Medications   indomethacin (INDOCIN) 50 MG capsule   colchicine 0.6 MG tablet   Other Relevant Orders   Uric acid       Meds ordered this encounter  Medications  . indomethacin (INDOCIN) 50 MG capsule    Sig: Take 1 capsule (50 mg total) by mouth 3 (three) times daily with meals.    Dispense:  30 capsule    Refill:  0  . colchicine 0.6 MG tablet    Sig: Take 1 tablet (0.6 mg total) by mouth See admin instructions. Take 2 tablets PO now, then take 1 tablet PO in 1 hour    Dispense:  3 tablet    Refill:  0     Noreene Larsson, NP

## 2020-04-27 LAB — URIC ACID: Uric Acid: 4.1 mg/dL (ref 2.6–6.2)

## 2020-04-27 NOTE — Progress Notes (Signed)
Her uric acid level was normal.  If her ankle is still hurting in 2-3 days despite taking the medicine, please let us know and we can consider an orthopedic consult.

## 2020-04-27 NOTE — Telephone Encounter (Signed)
Received approval from [employer]. Effective dates: 04/26/20-10/23/20.

## 2020-05-11 ENCOUNTER — Ambulatory Visit: Payer: 59 | Admitting: Family Medicine

## 2020-05-25 NOTE — Progress Notes (Signed)
Virtual Visit via Video Note  I connected with Rebecca Boyd on 05/27/20 at  9:00 AM EST by a video enabled telemedicine application and verified that I am speaking with the correct person using two identifiers.  Location: Patient: car Provider: office Persons participated in the visit- patient, provider   I discussed the limitations of evaluation and management by telemedicine and the availability of in person appointments. The patient expressed understanding and agreed to proceed.     I discussed the assessment and treatment plan with the patient. The patient was provided an opportunity to ask questions and all were answered. The patient agreed with the plan and demonstrated an understanding of the instructions.   The patient was advised to call back or seek an in-person evaluation if the symptoms worsen or if the condition fails to improve as anticipated.  I provided 15 minutes of non-face-to-face time during this encounter.   [name], MD    [facility] MD/PA/NP OP Progress Note  05/27/2020 9:17 AM Rebecca Boyd  MRN:  [id number]  Chief Complaint:  Chief Complaint    Follow-up; Depression; Anxiety     HPI:  This is a follow-up appointment for depression and anxiety.  She states that she did not do things so much during holiday due to loss of her uncle.  She used to be very close with him.  He has been sick for a long time.  She states that she continues to have leg edema, although it occurs intermittently, and not persistent.  She believes that Xanax worked better.  However on further elaboration, she does not feel anxious as much compared to before.  She reports good relationship with her mother and sister at home.  Both of them have been recovering from [place]. She tends to stay at home due to pandemic.  She tends to be irritable, and does not want to be bothered by others.  She has middle insomnia.  She has sleep study last year, and is currently waiting for the result.   She feels fatigue.  She has mild anhedonia.  She denies difficulty in concentration.  She has increasing in appetite.  She denies SI. She does not want to change any medication at this time until she finds the cause of leg edema.   Daily routine:watch TV, Exercise:takes a walk, everyday Employment:cleaning machine,used to work Dentist for five years,  Household:mother, sister, Marital status:single Number of children:0  Wt Readings from Last 3 Encounters:  04/26/20 177 lb (80.3 kg)  04/22/20 176 lb 14.4 oz (80.2 kg)  04/15/20 176 lb (79.8 kg)   09/25/19 173 lb (78.5 kg)      Visit Diagnosis:    ICD-10-CM   1. GAD (generalized anxiety disorder)  F41.1   2. MDD (major depressive disorder), recurrent episode, mild ([place])  F33.0     Past Psychiatric History: Please see initial evaluation for full details. I have reviewed the history. No updates at this time.     Past Medical History:  Past Medical History:  Diagnosis Date  . Anemia   . Anxiety   . Bacterial vaginosis 07/27/2014  . Cervical neck pain with evidence of disc disease 10/02/2013  . Depressive disorder, not elsewhere classified   . Family history of breast cancer   . Family history of breast cancer in sister 09/25/2018  . Family history of colon cancer   . Family history of lung cancer   . FH: breast cancer in relative when <69 years old  06/10/2018   Dx at age 28  . Headache 03/16/2016  . 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 Hypertension   . Migraine   . Migraines   . Miscarriage 2009  . Neck pain   . Nondisplaced fracture of fifth metatarsal bone, left foot, subsequent encounter for fracture with routine healing 08/15/18 09/25/2018  . Pruritus 09/29/2018  . Pruritus ani 09/25/2018  . Rash and nonspecific skin eruption 05/14/2017  . Vaginitis    cyctitis    Past Surgical History:  Procedure Laterality Date  . cervical cryotherapy N/A 1999  . DILITATION &  CURRETTAGE/HYSTROSCOPY WITH THERMACHOICE ABLATION  08/26/2013   Procedure: DILATATION & CURETTAGE/HYSTEROSCOPY WITH THERMACHOICE ENDOMETRIAL ABLATION Procedure #2 Total Therapy Time=min       sec;  Surgeon: Jonnie Kind, MD;  Location: [facility];  Service: Gynecology;;  . LAPAROSCOPIC BILATERAL SALPINGECTOMY Bilateral 08/26/2013   Procedure: LAPAROSCOPIC BILATERAL SALPINGECTOMY AND REMOVAL OF LEFT PERITUBAL CYST Procedure #1;  Surgeon: Jonnie Kind, MD;  Location: [facility];  Service: Gynecology;  Laterality: Bilateral;  . LAPAROSCOPIC LYSIS OF ADHESIONS  08/26/2013   Procedure: LAPAROSCOPIC LYSIS OF ADHESIONS Procedure #1;  Surgeon: Jonnie Kind, MD;  Location: [facility];  Service: Gynecology;;  . Carmelia Bake LESION REMOVAL  08/26/2013   Procedure: REMOVAL OF VULVAR SEBACEOUS CYST Procedure #3;  Surgeon: Jonnie Kind, MD;  Location: [facility];  Service: Gynecology;;    Family Psychiatric History: Please see initial evaluation for full details. I have reviewed the history. No updates at this time.     Family History:  Family History  Problem Relation Age of Onset  . Drug abuse Mother   . Hypertension Father   . Breast cancer Sister        dx 24  . Hypertension Maternal Grandmother   . Hypertension Maternal Grandfather   . Hypertension Paternal Grandmother   . Breast cancer Paternal Grandmother        dx late 22s  . Colon cancer Cousin 50    Social History:  Social History   Socioeconomic History  . Marital status: Single    Spouse name: Not on file  . Number of children: Not on file  . Years of education: Not on file  . Highest education level: Not on file  Occupational History  . Not on file  Tobacco Use  . Smoking status: Former Smoker    Packs/day: 0.00    Years: 1.00    Pack years: 0.00    Types: Cigars    Quit date: 01/05/2017    Years since quitting: 3.3  . Smokeless tobacco: Never Used  Vaping Use  . Vaping Use: Never used  Substance and Sexual Activity  . Alcohol use:  Yes    Comment: occasional  . Drug use: No  . Sexual activity: Yes    Birth control/protection: None  Other Topics Concern  . Not on file  Social History Narrative   Lives home with mother.  Works at [employer].  Education 10th grade/GED.  No children.  Single.     Social Determinants of Health   Financial Resource Strain: Not on file  Food Insecurity: Not on file  Transportation Needs: Not on file  Physical Activity: Not on file  Stress: Not on file  Social Connections: Not on file    Allergies:  Allergies  Allergen Reactions  . Diphenhydramine Hcl Anaphylaxis    (Benadryl)  . Orange Fruit Hives, Shortness Of Breath,  Itching and Other (See Comments)    MAKES IT DIFFICULT TO BREATH  . Latex Rash    Metabolic Disorder Labs: Lab Results  Component Value Date   HGBA1C 5.3 03/18/2020   MPG 103 06/10/2018   MPG 108 05/29/2013   No results found for: PROLACTIN Lab Results  Component Value Date   CHOL 192 03/18/2020   TRIG 76 03/18/2020   HDL 45 03/18/2020   CHOLHDL 4.3 03/18/2020   VLDL 19 05/10/2016   LDLCALC 133 (H) 03/18/2020   LDLCALC 118 (H) 06/10/2018   Lab Results  Component Value Date   TSH 0.951 03/18/2020   TSH 1.67 06/10/2018    Therapeutic Level Labs: No results found for: LITHIUM No results found for: VALPROATE No components found for:  CBMZ  Current Medications: Current Outpatient Medications  Medication Sig Dispense Refill  . ALBUTEROL SULFATE HFA IN Inhale 2 puffs into the lungs.    Marland Kitchen amLODipine (NORVASC) 10 MG tablet Take 1 tablet (10 mg total) by mouth daily. 90 tablet 3  . [START ON 06/16/2020] buPROPion (WELLBUTRIN XL) 150 MG 24 hr tablet Take 1 tablet (150 mg total) by mouth daily. 30 tablet 1  . cetirizine (ZYRTEC ALLERGY) 10 MG tablet Take 1 tablet (10 mg total) by mouth daily. 30 tablet 1  . colchicine 0.6 MG tablet Take 1 tablet (0.6 mg total) by mouth See admin instructions. Take 2 tablets PO now, then take 1 tablet PO in 1  hour 3 tablet 0  . Erenumab-aooe (AIMOVIG) 70 MG/ML SOAJ Inject 70 mg into the skin every 30 (thirty) days. 1 pen 11  . ergocalciferol (VITAMIN D2) 1.25 MG (50000 UT) capsule Take 1 capsule (50,000 Units total) by mouth once a week. One capsule once weekly 12 capsule 2  . indomethacin (INDOCIN) 50 MG capsule Take 1 capsule (50 mg total) by mouth 3 (three) times daily with meals. 30 capsule 0  . oxyCODONE-acetaminophen (PERCOCET/ROXICET) 5-325 MG tablet Take 1-2 tablets by mouth 2 (two) times daily as needed.     . SUMAtriptan (IMITREX) 6 MG/0.5ML SOLN injection Use one injection at onset of migraine.  May repeat in 2 hrs, if needed.  Max dose: 2 inj/day. This is a 30 day prescription. 5 mL 11  . topiramate (TOPAMAX) 100 MG tablet Take 1 tablet (100 mg total) by mouth 2 (two) times daily. 180 tablet 4  . triamterene-hydrochlorothiazide (MAXZIDE) 75-50 MG tablet Take 1 tablet by mouth daily. 30 tablet 4   No current facility-administered medications for this visit.     Musculoskeletal: Strength & Muscle Tone: N/A Gait & Station: N/A Patient leans: N/A  Psychiatric Specialty Exam: Review of Systems  Psychiatric/Behavioral: Positive for dysphoric mood and sleep disturbance. Negative for agitation, behavioral problems, confusion, decreased concentration, hallucinations, self-injury and suicidal ideas. The patient is not nervous/anxious and is not hyperactive.   All other systems reviewed and are negative.   There were no vitals taken for this visit.There is no height or weight on file to calculate BMI.  General Appearance: Fairly Groomed  Eye Contact:  Good  Speech:  Clear and Coherent  Volume:  Normal  Mood:  "sometime depressed"  Affect:  Appropriate, Congruent and slightly down  Thought Process:  Coherent  Orientation:  Full (Time, Place, and Person)  Thought Content: Logical   Suicidal Thoughts:  No  Homicidal Thoughts:  No  Memory:  Immediate;   Good  Judgement:  Good  Insight:   Fair  Psychomotor Activity:  Normal  Concentration:  Concentration: Good and Attention Span: Good  Recall:  Good  Fund of Knowledge: Good  Language: Good  Akathisia:  No  Handed:  Right  AIMS (if indicated): not done  Assets:  Communication Skills Desire for Improvement  ADL's:  Intact  Cognition: WNL  Sleep:  Poor   Screenings: GAD-7   Flowsheet Row Office Visit from 12/10/2019 in [facility] Office Visit from 10/28/2019 in [facility] Office Visit from 09/25/2019 in [facility] Office Visit from 05/21/2019 in [facility]  Total GAD-7 Score 14 15 6 18     PHQ2-9   [place] Office Visit from 04/26/2020 in [place] Visit from 04/15/2020 in [place] Visit from 03/18/2020 in [facility] Video Visit from 03/03/2020 in [place] Visit from 02/11/2020 in [facility]  PHQ-2 Total Score 2 0 2 1 0  PHQ-9 Total Score 6 3 5  -- --       Assessment and Plan:  Rebecca Boyd is a 45 y.o. year old female with a history of Depressive Disorder NOS and Psychotic Disorder NOS, hypertension,migraine, who presents for follow up appointment for below.   1. GAD (generalized anxiety disorder) 2. MDD (major depressive disorder), recurrent episode, mild ([facility]) There has been slight improvement in anxiety since switching from venlafaxine to bupropion.  Psychosocial stressors includes loss of her maternal uncle, taking care of her mother with COPD, and her sister with breast cancer.  Although she will benefit from up titration of bupropion for depression/anxiety, she would like to stay on the same dose at this time due to leg edema, which occurs intermittently.  PCP is aware of this leg edema, and she is undergoing evaluation.   Plan I have reviewed and updated plans as below 1. Continue bupropion 150 mg daily - monitor weight gain, leg edema 2.Next appointment-4/21  at 9:20 for 20 mins, video - result pending for sleep study - TSH was reportedly checked in late 2021; wnl  Past trials of medication: sertraline,fluoxetine(pruritis),lexapro (weight gain),venlafaxine (leg edema), duloxetine (weakness),quetiapine, Xanax  The patient demonstrates the following risk factors for suicide: Chronic risk factors for suicide include:psychiatric disorder ofdepression. Acute risk factorsfor suicide include:loss (financial, interpersonal, professional). Protective factorsfor this patient include:coping skills and hope for the future. Considering these factors, the overall suicide risk at this point appears to below. Patientisappropriate for outpatient follow up.   [name], MD 05/27/2020, 9:17 AM

## 2020-05-27 ENCOUNTER — Other Ambulatory Visit: Payer: Self-pay

## 2020-05-27 ENCOUNTER — Telehealth (INDEPENDENT_AMBULATORY_CARE_PROVIDER_SITE_OTHER): Payer: 59 | Admitting: Psychiatry

## 2020-05-27 ENCOUNTER — Encounter: Payer: Self-pay | Admitting: Psychiatry

## 2020-05-27 DIAGNOSIS — F411 Generalized anxiety disorder: Secondary | ICD-10-CM

## 2020-05-27 DIAGNOSIS — F330 Major depressive disorder, recurrent, mild: Secondary | ICD-10-CM

## 2020-05-27 MED ORDER — BUPROPION HCL ER (XL) 150 MG PO TB24
150.0000 mg | ORAL_TABLET | Freq: Every day | ORAL | 1 refills | Status: DC
Start: 2020-06-16 — End: 2020-08-19

## 2020-05-27 NOTE — Patient Instructions (Signed)
1. Continue bupropion 150 mg daily  2.Next appointment-4/21 at 9:20

## 2020-06-02 ENCOUNTER — Telehealth: Payer: Self-pay | Admitting: Pulmonary Disease

## 2020-06-02 DIAGNOSIS — G4733 Obstructive sleep apnea (adult) (pediatric): Secondary | ICD-10-CM | POA: Diagnosis not present

## 2020-06-02 NOTE — Telephone Encounter (Signed)
HST 04/21/20 >> AHI 6.9, SpO2 low 87%    Please inform her that her sleep study shows mild obstructive sleep apnea.  Please arrange for ROV with me or NP to discuss treatment options.

## 2020-06-03 NOTE — Telephone Encounter (Signed)
Called and went over HST results per Dr Halford Chessman with patient. All questions answered and patient expressed full understanding of results. Scheduled Televisit with NP for Friday 06/04/2020 at 10:30am. Patient agreeable to time, date and visit. Nothing further needed at this time.

## 2020-06-04 ENCOUNTER — Other Ambulatory Visit: Payer: Self-pay

## 2020-06-04 ENCOUNTER — Encounter: Payer: Self-pay | Admitting: Primary Care

## 2020-06-04 ENCOUNTER — Ambulatory Visit (INDEPENDENT_AMBULATORY_CARE_PROVIDER_SITE_OTHER): Payer: 59 | Admitting: Primary Care

## 2020-06-04 DIAGNOSIS — G4733 Obstructive sleep apnea (adult) (pediatric): Secondary | ICD-10-CM | POA: Diagnosis not present

## 2020-06-04 NOTE — Patient Instructions (Addendum)
- Home sleep study showed mild obstructive sleep apnea; you had 6.9 events an hours with lowest oxygen 87% - Start auto cpap 5-15cm h20 - Aim to wear 4-6 hours each night - Do not drive if experiencing excessive daytime fatigue  - Follow up in 6-8 weeks for compliance check   CPAP and BPAP Information CPAP and BPAP are methods that use air pressure to keep your airways open and to help you breathe well. CPAP and BPAP use different amounts of pressure. Your health care provider will tell you whether CPAP or BPAP would be more helpful for you.  CPAP stands for "continuous positive airway pressure." With CPAP, the amount of pressure stays the same while you breathe in and out.  BPAP stands for "bi-level positive airway pressure." With BPAP, the amount of pressure will be higher when you breathe in (inhale) and lower when you breathe out(exhale). This allows you to take larger breaths. CPAP or BPAP may be used in the hospital, or your health care provider may want you to use it at home. You may need to have a sleep study before your health care provider can order a machine for you to use at home. Why are CPAP and BPAP treatments used? CPAP or BPAP can be helpful if you have:  Sleep apnea.  Chronic obstructive pulmonary disease (COPD).  Heart failure.  Medical conditions that cause muscle weakness, including muscular dystrophy or amyotrophic lateral sclerosis (ALS).  Other problems that cause breathing to be shallow, weak, abnormal, or difficult. CPAP and BPAP are most commonly used for obstructive sleep apnea (OSA) to keep the airways from collapsing when the muscles relax during sleep. How is CPAP or BPAP administered? Both CPAP and BPAP are provided by a small machine with a flexible plastic tube that attaches to a plastic mask that you wear. Air is blown through the mask into your nose or mouth. The amount of pressure that is used to blow the air can be adjusted on the machine. Your health  care provider will set the pressure setting and help you find the best mask for you. When should CPAP or BPAP be used? In most cases, the mask only needs to be worn during sleep. Generally, the mask needs to be worn throughout the night and during any daytime naps. People with certain medical conditions may also need to wear the mask at other times when they are awake. Follow instructions from your health care provider about when to use the machine. What are some tips for using the mask?  Because the mask needs to be snug, some people feel trapped or closed-in (claustrophobic) when first using the mask. If you feel this way, you may need to get used to the mask. One way to do this is to hold the mask loosely over your nose or mouth and then gradually apply the mask more snugly. You can also gradually increase the amount of time that you use the mask.  Masks are available in various types and sizes. If your mask does not fit well, talk with your health care provider about getting a different one. Some common types of masks include: ? Full face masks, which fit over the mouth and nose. ? Nasal masks, which fit over the nose. ? Nasal pillow or prong masks, which fit into the nostrils.  If you are using a mask that fits over your nose and you tend to breathe through your mouth, a chin strap may be applied to help  keep your mouth closed.  Some CPAP and BPAP machines have alarms that may sound if the mask comes off or develops a leak.  If you have trouble with the mask, it is very important that you talk with your health care provider about finding a way to make the mask easier to tolerate. Do not stop using the mask. There could be a negative impact to your health if you stop using the mask.   What are some tips for using the machine?  Place your CPAP or BPAP machine on a secure table or stand near an electrical outlet.  Know where the on/off switch is on the machine.  Follow instructions from your  health care provider about how to set the pressure on your machine and when you should use it.  Do not eat or drink while the CPAP or BPAP machine is on. Food or fluids could get pushed into your lungs by the pressure of the CPAP or BPAP.  For home use, CPAP and BPAP machines can be rented or purchased through home health care companies. Many different brands of machines are available. Renting a machine before purchasing may help you find out which particular machine works well for you. Your insurance may also decide which machine you may get.  Keep the CPAP or BPAP machine and attachments clean. Ask your health care provider for specific instructions. Follow these instructions at home:  Do not use any products that contain nicotine or tobacco, such as cigarettes, e-cigarettes, and chewing tobacco. If you need help quitting, ask your health care provider.  Keep all follow-up visits as told by your health care provider. This is important. Contact a health care provider if:  You have redness or pressure sores on your head, face, mouth, or nose from the mask or head gear.  You have trouble using the CPAP or BPAP machine.  You cannot tolerate wearing the CPAP or BPAP mask.  Someone tells you that you snore even when wearing your CPAP or BPAP. Get help right away if:  You have trouble breathing.  You feel confused. Summary  CPAP and BPAP are methods that use air pressure to keep your airways open and to help you breathe well.  You may need to have a sleep study before your health care provider can order a machine for home use.  If you have trouble with the mask, it is very important that you talk with your health care provider about finding a way to make the mask easier to tolerate. Do not stop using the mask. There could be a negative impact to your health if you stop using the mask.  Follow instructions from your health care provider about when to use the machine. This information is  not intended to replace advice given to you by your health care provider. Make sure you discuss any questions you have with your health care provider. Document Revised: 05/09/2019 Document Reviewed: 05/12/2019 Elsevier Patient Education  2021 [employer].

## 2020-06-04 NOTE — Progress Notes (Signed)
Virtual Visit via Telephone Note  I connected with Rebecca Boyd on 06/04/20 at 10:30 AM EST by telephone and verified that I am speaking with the correct person using two identifiers.  Location: Patient: Home Provider: Office   I discussed the limitations, risks, security and privacy concerns of performing an evaluation and management service by telephone and the availability of in person appointments. I also discussed with the patient that there may be a patient responsible charge related to this service. The patient expressed understanding and agreed to proceed.   History of Present Illness: 45 year old female, never smoked. PMH significant for asthma, HTN, chronic migraine, obesity. Patient of Dr. Halford Chessman, seen for initial sleep consult on 04/07/20.   Previous [facility] pulmonary encounter: 04/07/20 She has been working 3rd shift for past 3 months.  Her sleep issues have been present prior to this.  She has noticed trouble for couple of years.  She can fall asleep, but then has trouble staying asleep.  She snores some.  She isn't sure if she stops breathing.  She is a restless sleeper and talks in her sleep.  Her work schedule is from 10 pm to 630 am.  She tries to go to sleep at 8 am, but has trouble staying asleep.  She estimates getting about 3 to 4 hours sleep per day.  She isn't using anything to help sleep or stay awake.    She denies sleep walking bruxism, or nightmares.  There is no history of restless legs.  She denies sleep hallucinations, sleep paralysis, or cataplexy.  The Epworth score is 5 out of 24.   06/04/2020 - interim hx Patient contacted today to review home sleep study results. HST 04/21/20 showed mild obstructive sleep apnea,  AHI 6.9, SpO2 low 87%. She reports symptoms of snoring and restless sleep. She works third shift and goes to bed around 8am. She gets 4 hours of sleep on average. Reviewed treatment options including weight loss, side sleeping position, oral appliance,  CPAP or referral to ENT for possible surgical intervention.    Observations/Objective:  - Able to speak in full sentences; No shortness of breath or wheezing  Assessment and Plan:  Mild OSA: - HST 04/21/20 showed mild OSA, AHI 6.9 with SpO2 low 87%.  - Reviewed treatment options. Due to symptoms and sleep schedule would recommend either oral appliance or CPAP trial. She is agreeing to move forward with CPAP. Sending in order for new CPAP start auto titrate 5-15cm h20, mask of choice. Advised patient to aim to wear 4-6 hours a night and not drive if experiencing excessive daytime fatigue or somnolence.   Follow Up Instructions:   - 6-8 weeks in office for compliance check   I discussed the assessment and treatment plan with the patient. The patient was provided an opportunity to ask questions and all were answered. The patient agreed with the plan and demonstrated an understanding of the instructions.   The patient was advised to call back or seek an in-person evaluation if the symptoms worsen or if the condition fails to improve as anticipated.  I provided 18 minutes of non-face-to-face time during this encounter.   Martyn Ehrich, NP

## 2020-06-08 ENCOUNTER — Ambulatory Visit (INDEPENDENT_AMBULATORY_CARE_PROVIDER_SITE_OTHER): Payer: 59 | Admitting: Nurse Practitioner

## 2020-06-08 ENCOUNTER — Encounter: Payer: Self-pay | Admitting: Nurse Practitioner

## 2020-06-08 ENCOUNTER — Other Ambulatory Visit: Payer: Self-pay

## 2020-06-08 DIAGNOSIS — R200 Anesthesia of skin: Secondary | ICD-10-CM

## 2020-06-08 DIAGNOSIS — R202 Paresthesia of skin: Secondary | ICD-10-CM | POA: Diagnosis not present

## 2020-06-08 NOTE — Patient Instructions (Signed)
For your left wrist/hand numbness and tingling I referred you to orthopedic surgery.  You can also use an over-the-counter brace from somewhere like [employer]. Some people get improvement by just wearing it at night while they sleep, but since yours is likely related to your job, you may benefit from wearing this while at work.

## 2020-06-08 NOTE — Progress Notes (Signed)
Acute Office Visit  Subjective:    Patient ID: Rebecca Boyd, female    DOB: [birth year], 45 y.o.   MRN: [id number]  Chief Complaint  Patient presents with  . Hand Pain    L hand tingling and numbness x2 weeks    HPI Patient is in today for left hand numbness and tingling for 2 weeks.  She has been using tylenol and oxycodone, but that is not helping.  She works at [employer] and uses a pressure washer repetitively.  Past Medical History:  Diagnosis Date  . Anemia   . Anxiety   . Bacterial vaginosis 07/27/2014  . Cervical neck pain with evidence of disc disease 10/02/2013  . Depressive disorder, not elsewhere classified   . Family history of breast cancer   . Family history of breast cancer in sister 09/25/2018  . Family history of colon cancer   . Family history of lung cancer   . FH: breast cancer in relative when <53 years old 06/10/2018   Dx at age 12  . Headache 03/16/2016  . 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 Hypertension   . Migraine   . Migraines   . Miscarriage 2009  . Neck pain   . Nondisplaced fracture of fifth metatarsal bone, left foot, subsequent encounter for fracture with routine healing 08/15/18 09/25/2018  . Pruritus 09/29/2018  . Pruritus ani 09/25/2018  . Rash and nonspecific skin eruption 05/14/2017  . Vaginitis    cyctitis    Past Surgical History:  Procedure Laterality Date  . cervical cryotherapy N/A 1999  . DILITATION & CURRETTAGE/HYSTROSCOPY WITH THERMACHOICE ABLATION  08/26/2013   Procedure: DILATATION & CURETTAGE/HYSTEROSCOPY WITH THERMACHOICE ENDOMETRIAL ABLATION Procedure #2 Total Therapy Time=min       sec;  Surgeon: Jonnie Kind, MD;  Location: [facility];  Service: Gynecology;;  . LAPAROSCOPIC BILATERAL SALPINGECTOMY Bilateral 08/26/2013   Procedure: LAPAROSCOPIC BILATERAL SALPINGECTOMY AND REMOVAL OF LEFT PERITUBAL CYST Procedure #1;  Surgeon: Jonnie Kind, MD;  Location: [facility];  Service: Gynecology;  Laterality:  Bilateral;  . LAPAROSCOPIC LYSIS OF ADHESIONS  08/26/2013   Procedure: LAPAROSCOPIC LYSIS OF ADHESIONS Procedure #1;  Surgeon: Jonnie Kind, MD;  Location: [facility];  Service: Gynecology;;  . Carmelia Bake LESION REMOVAL  08/26/2013   Procedure: REMOVAL OF VULVAR SEBACEOUS CYST Procedure #3;  Surgeon: Jonnie Kind, MD;  Location: [facility];  Service: Gynecology;;    Family History  Problem Relation Age of Onset  . Drug abuse Mother   . Hypertension Father   . Breast cancer Sister        dx 52  . Hypertension Maternal Grandmother   . Hypertension Maternal Grandfather   . Hypertension Paternal Grandmother   . Breast cancer Paternal Grandmother        dx late 51s  . Colon cancer Cousin 63    Social History   Socioeconomic History  . Marital status: Single    Spouse name: Not on file  . Number of children: Not on file  . Years of education: Not on file  . Highest education level: Not on file  Occupational History  . Not on file  Tobacco Use  . Smoking status: Former Smoker    Packs/day: 0.00    Years: 1.00    Pack years: 0.00    Types: Cigars    Quit date: 01/05/2017    Years since quitting: 3.4  . Smokeless tobacco: Never Used  Vaping  Use  . Vaping Use: Never used  Substance and Sexual Activity  . Alcohol use: Yes    Comment: occasional  . Drug use: No  . Sexual activity: Yes    Birth control/protection: None  Other Topics Concern  . Not on file  Social History Narrative   Lives home with mother.  Works at [employer].  Education 10th grade/GED.  No children.  Single.     Social Determinants of Health   Financial Resource Strain: Not on file  Food Insecurity: Not on file  Transportation Needs: Not on file  Physical Activity: Not on file  Stress: Not on file  Social Connections: Not on file  Intimate Partner Violence: Not on file    Outpatient Medications Prior to Visit  Medication Sig Dispense Refill  . ALBUTEROL SULFATE HFA IN Inhale 2 puffs into the  lungs.    Marland Kitchen amLODipine (NORVASC) 10 MG tablet Take 1 tablet (10 mg total) by mouth daily. 90 tablet 3  . [START ON 06/16/2020] buPROPion (WELLBUTRIN XL) 150 MG 24 hr tablet Take 1 tablet (150 mg total) by mouth daily. 30 tablet 1  . colchicine 0.6 MG tablet Take 1 tablet (0.6 mg total) by mouth See admin instructions. Take 2 tablets PO now, then take 1 tablet PO in 1 hour 3 tablet 0  . Erenumab-aooe (AIMOVIG) 70 MG/ML SOAJ Inject 70 mg into the skin every 30 (thirty) days. 1 pen 11  . ergocalciferol (VITAMIN D2) 1.25 MG (50000 UT) capsule Take 1 capsule (50,000 Units total) by mouth once a week. One capsule once weekly 12 capsule 2  . indomethacin (INDOCIN) 50 MG capsule Take 1 capsule (50 mg total) by mouth 3 (three) times daily with meals. 30 capsule 0  . oxyCODONE-acetaminophen (PERCOCET/ROXICET) 5-325 MG tablet Take 1-2 tablets by mouth 2 (two) times daily as needed.     . SUMAtriptan (IMITREX) 6 MG/0.5ML SOLN injection Use one injection at onset of migraine.  May repeat in 2 hrs, if needed.  Max dose: 2 inj/day. This is a 30 day prescription. 5 mL 11  . topiramate (TOPAMAX) 100 MG tablet Take 1 tablet (100 mg total) by mouth 2 (two) times daily. 180 tablet 4  . triamterene-hydrochlorothiazide (MAXZIDE) 75-50 MG tablet Take 1 tablet by mouth daily. 30 tablet 4  . cetirizine (ZYRTEC ALLERGY) 10 MG tablet Take 1 tablet (10 mg total) by mouth daily. 30 tablet 1   No facility-administered medications prior to visit.    Allergies  Allergen Reactions  . Diphenhydramine Hcl Anaphylaxis    (Benadryl)  . Orange Fruit Hives, Shortness Of Breath, Itching and Other (See Comments)    MAKES IT DIFFICULT TO BREATH  . Latex Rash    Review of Systems  Constitutional: Negative.   Respiratory: Negative.   Cardiovascular: Negative.   Musculoskeletal: Positive for arthralgias.       Left hand/wrist pain  Neurological: Positive for numbness.       Objective:    Physical Exam Constitutional:       Appearance: Normal appearance.  Musculoskeletal:        General: No tenderness or signs of injury.     Comments: Positive Phalen's test  Neurological:     Mental Status: She is alert.     BP (!) 140/93 (BP Location: Left Arm, Patient Position: Sitting, Cuff Size: Normal)   Pulse 85   Temp 98.2 F (36.8 C) (Oral)   Ht 5' (1.524 m)   Wt 181 lb (82.1  kg)   LMP 05/01/2020   SpO2 98%   BMI 35.35 kg/m  Wt Readings from Last 3 Encounters:  06/08/20 181 lb (82.1 kg)  04/26/20 177 lb (80.3 kg)  04/22/20 176 lb 14.4 oz (80.2 kg)    There are no preventive care reminders to display for this patient.  There are no preventive care reminders to display for this patient.   Lab Results  Component Value Date   TSH 0.951 03/18/2020   Lab Results  Component Value Date   WBC 6.0 03/18/2020   HGB 13.9 03/18/2020   HCT 41.7 03/18/2020   MCV 89 03/18/2020   PLT 217 03/18/2020   Lab Results  Component Value Date   NA 136 03/23/2020   K 3.1 (L) 03/23/2020   CO2 24 03/23/2020   GLUCOSE 96 03/23/2020   BUN 9 03/23/2020   CREATININE 0.65 03/23/2020   BILITOT 0.8 07/11/2019   ALKPHOS 67 07/11/2019   AST 15 07/11/2019   ALT 21 07/11/2019   PROT 6.7 07/11/2019   ALBUMIN 3.6 07/11/2019   CALCIUM 9.1 03/23/2020   ANIONGAP 10 03/23/2020   Lab Results  Component Value Date   CHOL 192 03/18/2020   Lab Results  Component Value Date   HDL 45 03/18/2020   Lab Results  Component Value Date   LDLCALC 133 (H) 03/18/2020   Lab Results  Component Value Date   TRIG 76 03/18/2020   Lab Results  Component Value Date   CHOLHDL 4.3 03/18/2020   Lab Results  Component Value Date   HGBA1C 5.3 03/18/2020       Assessment & Plan:   Problem List Items Addressed This Visit      Other   Numbness and tingling of left hand    -left hand -suspect carpal tunnel syndrome -recommend OTC wrist brace -referral to ortho      Relevant Orders   Ambulatory referral to Orthopedic Surgery        No orders of the defined types were placed in this encounter.    Noreene Larsson, NP

## 2020-06-08 NOTE — Assessment & Plan Note (Signed)
-  left hand -suspect carpal tunnel syndrome -recommend OTC wrist brace -referral to ortho

## 2020-06-10 ENCOUNTER — Ambulatory Visit: Payer: 59 | Admitting: Family Medicine

## 2020-06-14 ENCOUNTER — Other Ambulatory Visit: Payer: Self-pay

## 2020-06-14 ENCOUNTER — Ambulatory Visit: Payer: 59

## 2020-06-14 ENCOUNTER — Encounter: Payer: Self-pay | Admitting: Family Medicine

## 2020-06-14 ENCOUNTER — Telehealth (INDEPENDENT_AMBULATORY_CARE_PROVIDER_SITE_OTHER): Payer: 59 | Admitting: Family Medicine

## 2020-06-14 VITALS — BP 140/95 | Ht 60.0 in | Wt 180.0 lb

## 2020-06-14 DIAGNOSIS — R21 Rash and other nonspecific skin eruption: Secondary | ICD-10-CM | POA: Diagnosis not present

## 2020-06-14 DIAGNOSIS — I10 Essential (primary) hypertension: Secondary | ICD-10-CM | POA: Diagnosis not present

## 2020-06-14 MED ORDER — PREDNISONE 5 MG (21) PO TBPK: 5.0000 mg | ORAL_TABLET | ORAL | 0 refills | Status: DC

## 2020-06-14 MED ORDER — BETAMETHASONE DIPROPIONATE 0.05 % EX CREA: TOPICAL_CREAM | Freq: Two times a day (BID) | CUTANEOUS | 0 refills | Status: DC

## 2020-06-14 MED ORDER — METHYLPREDNISOLONE ACETATE 80 MG/ML IJ SUSP
80.0000 mg | Freq: Once | INTRAMUSCULAR | Status: AC
  Administered 2020-06-14: 80 mg via INTRAMUSCULAR

## 2020-06-14 NOTE — Patient Instructions (Signed)
F/U as before, call if ou need me sooner  You are being referred to the  Dermatologist  That you have seen in the past.  Please come in this morning for depo medrol 80 mg IM , also short course of prednisone and topical steroid are  Prescribed  Thanks for choosing [facility], we consider it a privelige to serve you.

## 2020-06-14 NOTE — Progress Notes (Signed)
Virtual Visit via Video Note  I connected with Rebecca Boyd on 06/14/20 at  8:20 AM EST by a video enabled telemedicine application and verified that I am speaking with the correct person using two identifiers.  Location: Patient: home Provider: office   I discussed the limitations of evaluation and management by telemedicine and the availability of in person appointments. The patient expressed understanding and agreed to proceed.  History of Present Illness: 1 week h/o spreading rash, start red then darkens, affecting arms, legs , anterior chest , scalp and back. No fever or drainage,has had in the past, most recent about 5 years ago   Observations/Objective: BP (!) 140/95   Ht 5' (1.524 m)   Wt 180 lb (81.6 kg)   BMI 35.15 kg/m  Good communication with no confusion and intact memory. Alert and oriented x 3 No signs of respiratory distress during speech  Hyperpigmented rash , macular note on upper extremity, no erythema or drainage  Assessment and Plan:  Rash and nonspecific skin eruption 1 week h/o generalized rash, depo medrol in office , short course of prednisone and topical betamethasone sparingly, also refer to derm she has seen in the past  Essential hypertension, benign DASH diet and commitment to daily physical activity for a minimum of 30 minutes discussed and encouraged, as a part of hypertension management. The importance of attaining a healthy weight is also discussed.  BP/Weight 06/17/2020 06/14/2020 06/08/2020 04/26/2020 04/22/2020 04/15/2020 81/0/1751  Systolic BP 025 852 778 242 353 614 431  Diastolic BP 91 95 93 85 89 85 80  Wt. (Lbs) 178 180 181 177 176.9 176 176  BMI 34.76 35.15 35.35 34.57 34.55 34.37 34.37  Some encounter information is confidential and restricted. Go to Review Flowsheets activity to see all data.  needs in office visit for med adjustment       Follow Up Instructions:    I discussed the assessment and treatment plan with the  patient. The patient was provided an opportunity to ask questions and all were answered. The patient agreed with the plan and demonstrated an understanding of the instructions.   The patient was advised to call back or seek an in-person evaluation if the symptoms worsen or if the condition fails to improve as anticipated.  I provided 11 minutes of non-face-to-face time during this encounter.   Tula Nakayama, MD

## 2020-06-14 NOTE — Assessment & Plan Note (Addendum)
1 week h/o generalized rash, depo medrol in office , short course of prednisone and topical betamethasone sparingly, also refer to derm she has seen in the past

## 2020-06-16 NOTE — Progress Notes (Signed)
Reviewed and agree with assessment/plan.   Davi Rotan, MD [facility] 06/16/2020, 10:57 AM Pager:  [phone]  

## 2020-06-17 ENCOUNTER — Encounter: Payer: Self-pay | Admitting: Orthopedic Surgery

## 2020-06-17 ENCOUNTER — Ambulatory Visit (INDEPENDENT_AMBULATORY_CARE_PROVIDER_SITE_OTHER): Payer: 59 | Admitting: Orthopedic Surgery

## 2020-06-17 ENCOUNTER — Other Ambulatory Visit: Payer: Self-pay

## 2020-06-17 VITALS — BP 140/91 | HR 77 | Ht 60.0 in | Wt 178.0 lb

## 2020-06-17 DIAGNOSIS — G5602 Carpal tunnel syndrome, left upper limb: Secondary | ICD-10-CM | POA: Diagnosis not present

## 2020-06-17 DIAGNOSIS — R202 Paresthesia of skin: Secondary | ICD-10-CM

## 2020-06-17 MED ORDER — GABAPENTIN 300 MG PO CAPS: 300.0000 mg | ORAL_CAPSULE | Freq: Every day | ORAL | 0 refills | Status: DC

## 2020-06-17 NOTE — Patient Instructions (Addendum)
Wrist splint Gabapentin  Vit B 6 100 mg twice a day  NCS   Carpal Tunnel Syndrome  Carpal tunnel syndrome is a condition that causes pain, numbness, and weakness in your hand and fingers. The carpal tunnel is a narrow area located on the palm side of your wrist. Repeated wrist motion or certain diseases may cause swelling within the tunnel. This swelling pinches the main nerve in the wrist. The main nerve in the wrist is called the median nerve. What are the causes? This condition may be caused by:  Repeated and forceful wrist and hand motions.  Wrist injuries.  Arthritis.  A cyst or tumor in the carpal tunnel.  Fluid buildup during pregnancy.  Use of tools that vibrate. Sometimes the cause of this condition is not known. What increases the risk? The following factors may make you more likely to develop this condition:  Having a job that requires you to repeatedly or forcefully move your wrist or hand or requires you to use tools that vibrate. This may include jobs that involve using computers, working on an [employer], or working with [place] such as Pension scheme manager.  Being a woman.  Having certain conditions, such as: ? Diabetes. ? Obesity. ? An underactive thyroid (hypothyroidism). ? Kidney failure. ? Rheumatoid arthritis. What are the signs or symptoms? Symptoms of this condition include:  A tingling feeling in your fingers, especially in your thumb, index, and middle fingers.  Tingling or numbness in your hand.  An aching feeling in your entire arm, especially when your wrist and elbow are bent for a long time.  Wrist pain that goes up your arm to your shoulder.  Pain that goes down into your palm or fingers.  A weak feeling in your hands. You may have trouble grabbing and holding items. Your symptoms may feel worse during the night. How is this diagnosed? This condition is diagnosed with a medical history and physical exam. You may also have tests,  including:  Electromyogram (EMG). This test measures electrical signals sent by your nerves into the muscles.  Nerve conduction study. This test measures how well electrical signals pass through your nerves.  Imaging tests, such as X-rays, ultrasound, and MRI. These tests check for possible causes of your condition. How is this treated? This condition may be treated with:  Lifestyle changes. It is important to stop or change the activity that caused your condition.  Doing exercise and activities to strengthen and stretch your muscles and tendons (physical therapy).  Making lifestyle changes to help with your condition and learning how to do your daily activities safely (occupational therapy).  Medicines for pain and inflammation. This may include medicine that is injected into your wrist.  A wrist splint or brace.  Surgery. Follow these instructions at home: If you have a splint or brace:  Wear the splint or brace as told by your health care provider. Remove it only as told by your health care provider.  Loosen the splint or brace if your fingers tingle, become numb, or turn cold and blue.  Keep the splint or brace clean.  If the splint or brace is not waterproof: ? Do not let it get wet. ? Cover it with a watertight covering when you take a bath or shower. Managing pain, stiffness, and swelling If directed, put ice on the painful area. To do this:  If you have a removeable splint or brace, remove it as told by your health care provider.  Put ice in a plastic bag.  Place a towel between your skin and the bag or between the splint or brace and the bag.  Leave the ice on for 20 minutes, 2-3 times a day. Do not fall asleep with the cold pack on your skin.  Remove the ice if your skin turns bright red. This is very important. If you cannot feel pain, heat, or cold, you have a greater risk of damage to the area. Move your fingers often to reduce stiffness and swelling.    General instructions  Take over-the-counter and prescription medicines only as told by your health care provider.  Rest your wrist and hand from any activity that may be causing your pain. If your condition is work related, talk with your employer about changes that can be made, such as getting a wrist pad to use while typing.  Do any exercises as told by your health care provider, physical therapist, or occupational therapist.  Keep all follow-up visits. This is important. Contact a health care provider if:  You have new symptoms.  Your pain is not controlled with medicines.  Your symptoms get worse. Get help right away if:  You have severe numbness or tingling in your wrist or hand. Summary  Carpal tunnel syndrome is a condition that causes pain, numbness, and weakness in your hand and fingers.  It is usually caused by repeated wrist motions.  Lifestyle changes and medicines are used to treat carpal tunnel syndrome. Surgery may be recommended.  Follow your health care provider's instructions about wearing a splint, resting from activity, keeping follow-up visits, and calling for help. This information is not intended to replace advice given to you by your health care provider. Make sure you discuss any questions you have with your health care provider. Document Revised: 08/28/2019 Document Reviewed: 08/28/2019 Elsevier Patient Education  [employer].

## 2020-06-17 NOTE — Progress Notes (Signed)
Patient ID: Rebecca Boyd, female   DOB: 1975-05-15, 45 y.o.   MRN: [id number]  ASSESSMENT AND PLAN   Probable carpal tunnel left hand   PLAN:   Wrist splint Gabapentin  Vit B 6  NCS  F/U 6 weeks    Chief Complaint  Patient presents with  . Hand Pain    Lt hand numbness and tingling for 3 wks. NKI     HPI Rebecca Boyd is a 45 y.o. female.  Presents with 3 weeks h/o left hand numbness and tingling; no risk factors  Just started prednisone pack   Review of Systems Review of Systems   Past Medical History:  Diagnosis Date  . Anemia   . Anxiety   . Bacterial vaginosis 07/27/2014  . Cervical neck pain with evidence of disc disease 10/02/2013  . Depressive disorder, not elsewhere classified   . Family history of breast cancer   . Family history of breast cancer in sister 09/25/2018  . Family history of colon cancer   . Family history of lung cancer   . FH: breast cancer in relative when <69 years old 06/10/2018   Dx at age 48  . Headache 03/16/2016  . 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 Hypertension   . Migraine   . Migraines   . Miscarriage 2009  . Neck pain   . Nondisplaced fracture of fifth metatarsal bone, left foot, subsequent encounter for fracture with routine healing 08/15/18 09/25/2018  . Pruritus 09/29/2018  . Pruritus ani 09/25/2018  . Rash and nonspecific skin eruption 05/14/2017  . Vaginitis    cyctitis    Past Surgical History:  Procedure Laterality Date  . cervical cryotherapy N/A 1999  . DILITATION & CURRETTAGE/HYSTROSCOPY WITH THERMACHOICE ABLATION  08/26/2013   Procedure: DILATATION & CURETTAGE/HYSTEROSCOPY WITH THERMACHOICE ENDOMETRIAL ABLATION Procedure #2 Total Therapy Time=min       sec;  Surgeon: Jonnie Kind, MD;  Location: [facility];  Service: Gynecology;;  . LAPAROSCOPIC BILATERAL SALPINGECTOMY Bilateral 08/26/2013   Procedure: LAPAROSCOPIC BILATERAL SALPINGECTOMY AND REMOVAL OF LEFT PERITUBAL CYST Procedure #1;   Surgeon: Jonnie Kind, MD;  Location: [facility];  Service: Gynecology;  Laterality: Bilateral;  . LAPAROSCOPIC LYSIS OF ADHESIONS  08/26/2013   Procedure: LAPAROSCOPIC LYSIS OF ADHESIONS Procedure #1;  Surgeon: Jonnie Kind, MD;  Location: [facility];  Service: Gynecology;;  . Carmelia Bake LESION REMOVAL  08/26/2013   Procedure: REMOVAL OF VULVAR SEBACEOUS CYST Procedure #3;  Surgeon: Jonnie Kind, MD;  Location: [facility];  Service: Gynecology;;    Family History  Problem Relation Age of Onset  . Drug abuse Mother   . Hypertension Father   . Breast cancer Sister        dx 45  . Hypertension Maternal Grandmother   . Hypertension Maternal Grandfather   . Hypertension Paternal Grandmother   . Breast cancer Paternal Grandmother        dx late 64s  . Colon cancer Cousin 32     Social History   Tobacco Use  . Smoking status: Former Smoker    Packs/day: 0.00    Years: 1.00    Pack years: 0.00    Types: Cigars    Quit date: 01/05/2017    Years since quitting: 3.4  . Smokeless tobacco: Never Used  Vaping Use  . Vaping Use: Never used  Substance Use Topics  . Alcohol use: Yes    Comment: occasional  . Drug use:  No    Allergies  Allergen Reactions  . Diphenhydramine Hcl Anaphylaxis    (Benadryl)  . Orange Fruit Hives, Shortness Of Breath, Itching and Other (See Comments)    MAKES IT DIFFICULT TO BREATH  . Latex Rash    Current Outpatient Medications  Medication Sig Dispense Refill  . amLODipine (NORVASC) 10 MG tablet Take 1 tablet (10 mg total) by mouth daily. 90 tablet 3  . betamethasone dipropionate 0.05 % cream Apply topically 2 (two) times daily. 45 g 0  . buPROPion (WELLBUTRIN XL) 150 MG 24 hr tablet Take 1 tablet (150 mg total) by mouth daily. 30 tablet 1  . Erenumab-aooe (AIMOVIG) 70 MG/ML SOAJ Inject 70 mg into the skin every 30 (thirty) days. 1 pen 11  . ergocalciferol (VITAMIN D2) 1.25 MG (50000 UT) capsule Take 1 capsule (50,000 Units total) by mouth once a week. One  capsule once weekly 12 capsule 2  . gabapentin (NEURONTIN) 300 MG capsule Take 1 capsule (300 mg total) by mouth at bedtime. 42 capsule 0  . oxyCODONE-acetaminophen (PERCOCET/ROXICET) 5-325 MG tablet Take 1-2 tablets by mouth 2 (two) times daily as needed.     . predniSONE (STERAPRED UNI-PAK 21 TAB) 5 MG (21) TBPK tablet Take 1 tablet (5 mg total) by mouth as directed. Use as directed 21 tablet 0  . SUMAtriptan (IMITREX) 6 MG/0.5ML SOLN injection Use one injection at onset of migraine.  May repeat in 2 hrs, if needed.  Max dose: 2 inj/day. This is a 30 day prescription. 5 mL 11  . topiramate (TOPAMAX) 100 MG tablet Take 1 tablet (100 mg total) by mouth 2 (two) times daily. 180 tablet 4  . triamterene-hydrochlorothiazide (MAXZIDE) 75-50 MG tablet Take 1 tablet by mouth daily. 30 tablet 4  . ALBUTEROL SULFATE HFA IN Inhale 2 puffs into the lungs. (Patient not taking: Reported on 06/17/2020)    . colchicine 0.6 MG tablet Take 1 tablet (0.6 mg total) by mouth See admin instructions. Take 2 tablets PO now, then take 1 tablet PO in 1 hour (Patient not taking: Reported on 06/17/2020) 3 tablet 0   Current Facility-Administered Medications  Medication Dose Route Frequency Provider Last Rate Last Admin  . methylPREDNISolone acetate (DEPO-MEDROL) injection 80 mg  80 mg Intramuscular Once Fayrene Helper, MD         Physical Exam BP (!) 140/91   Pulse 77   Ht 5' (1.524 m)   Wt 178 lb (80.7 kg)   BMI 34.76 kg/m   The patient is well developed well nourished and well groomed.  Orientation to person place and time is normal  Mood is pleasant.  Ambulatory status normal gait and stance   LEFT Upper extremity examination reveals the following:  SWELLING NO TENDERNESS OVER THE CARPAL TUNNEL YES  Range of motion of the wrist NORMAL  Motor exam NORMAL   Wrist joint is stable  Provocative tests for carpal tunnel Phalen's test  + AT 15 SEC Carpal tunnel compression test NORMAL  Tinel's test  N/A  Pulses are normal in the radial and ulnar artery with a normal Allen's test.  SENSORY EXAM:   Opposite extremity NORMAL    Plan   MEDICAL DECISION SECTION   XRAYS: NONE   Encounter Diagnosis  Name Primary?  . Carpal tunnel syndrome of left wrist Yes

## 2020-06-20 ENCOUNTER — Encounter: Payer: Self-pay | Admitting: Family Medicine

## 2020-06-20 NOTE — Assessment & Plan Note (Signed)
DASH diet and commitment to daily physical activity for a minimum of 30 minutes discussed and encouraged, as a part of hypertension management. The importance of attaining a healthy weight is also discussed.  BP/Weight 06/17/2020 06/14/2020 06/08/2020 04/26/2020 04/22/2020 04/15/2020 27/09/7009  Systolic BP 003 496 116 435 391 225 834  Diastolic BP 91 95 93 85 89 85 80  Wt. (Lbs) 178 180 181 177 176.9 176 176  BMI 34.76 35.15 35.35 34.57 34.55 34.37 34.37  Some encounter information is confidential and restricted. Go to Review Flowsheets activity to see all data.  needs in office visit for med adjustment

## 2020-06-22 ENCOUNTER — Ambulatory Visit: Payer: 59 | Admitting: Nurse Practitioner

## 2020-06-22 ENCOUNTER — Ambulatory Visit: Payer: 59 | Admitting: Family Medicine

## 2020-07-14 ENCOUNTER — Telehealth: Payer: 59 | Admitting: Family Medicine

## 2020-07-22 ENCOUNTER — Encounter: Payer: Self-pay | Admitting: Family Medicine

## 2020-07-26 ENCOUNTER — Ambulatory Visit (INDEPENDENT_AMBULATORY_CARE_PROVIDER_SITE_OTHER): Payer: 59 | Admitting: Orthopedic Surgery

## 2020-07-26 ENCOUNTER — Other Ambulatory Visit: Payer: Self-pay

## 2020-07-26 VITALS — BP 135/88 | HR 88 | Ht 60.0 in | Wt 178.0 lb

## 2020-07-26 DIAGNOSIS — G5602 Carpal tunnel syndrome, left upper limb: Secondary | ICD-10-CM

## 2020-07-26 NOTE — Progress Notes (Signed)
FOLLOW-UP OFFICE VISIT   Encounter Diagnosis  Name Primary?  . Carpal tunnel syndrome of left wrist Yes    45 year old female here for follow-up after being treated as below for carpal tunnel syndrome   Probable carpal tunnel left hand    PLAN:    Wrist splint Gabapentin  Vit B 6  NCS  F/U 6 weeks   Ms. Machamer is improved secondary to taking a steroid Dosepak and perhaps some of the splinting that we instituted.  She has a test for carpal tunnel scheduled for end of the week April 1.  After that is done we can review that with her and then if the steroid pack is not curative then she is looking at a carpal tunnel release of the left wrist    (and prior treatment)  Physical Exam Musculoskeletal:     Comments: Left hand color capillary refill look normal no tenderness over the carpal tunnel no swelling of the joints grip strength is normal  Skin:    General: Skin is warm and dry.     Capillary Refill: Capillary refill takes less than 2 seconds.  Neurological:     Mental Status: She is alert.  Psychiatric:        Mood and Affect: Mood normal.        Behavior: Behavior normal.        Thought Content: Thought content normal.        Judgment: Judgment normal.     ASSESSMENT AND PLAN Carpal tunnel syndrome chronic problem stable Nerve conduction study pending Follow-up after nerve study

## 2020-07-29 ENCOUNTER — Ambulatory Visit: Payer: 59 | Admitting: Orthopedic Surgery

## 2020-07-29 ENCOUNTER — Encounter: Payer: Self-pay | Admitting: Family Medicine

## 2020-07-30 ENCOUNTER — Ambulatory Visit (INDEPENDENT_AMBULATORY_CARE_PROVIDER_SITE_OTHER): Payer: 59 | Admitting: Physical Medicine and Rehabilitation

## 2020-07-30 ENCOUNTER — Other Ambulatory Visit: Payer: Self-pay

## 2020-07-30 ENCOUNTER — Telehealth: Payer: Self-pay

## 2020-07-30 ENCOUNTER — Encounter: Payer: Self-pay | Admitting: Physical Medicine and Rehabilitation

## 2020-07-30 DIAGNOSIS — R202 Paresthesia of skin: Secondary | ICD-10-CM

## 2020-07-30 NOTE — Telephone Encounter (Signed)
Pt is calling to ask about her handicap placard

## 2020-07-30 NOTE — Procedures (Signed)
Evaluation of the left median (across palm) sensory nerve showed prolonged distal peak latency (Wrist, 4.1 ms).  All remaining nerves (as indicated in the following tables) were within normal limits.    All examined muscles (as indicated in the following table) showed no evidence of electrical instability.    Impression: The above electrodiagnostic study is ABNORMAL and reveals evidence of a mild to moderate left median nerve entrapment at the wrist (carpal tunnel syndrome) affecting sensory components.   There is no significant electrodiagnostic evidence of any other focal nerve entrapment, brachial plexopathy or cervical radiculopathy.   Recommendations: 1.  Follow-up with referring physician. 2.  Continue current management of symptoms. 3.  Continue use of resting splint at night-time and as needed during the day.  ___________________________ Wonda Olds Board Certified, American Board of Physical Medicine and Rehabilitation    Nerve Conduction Studies Anti Sensory Summary Table   Stim Site NR Peak (ms) Norm Peak (ms) P-T Amp (V) Norm P-T Amp Site1 Site2 Delta-P (ms) Dist (cm) Vel (m/s) Norm Vel (m/s)  Left Median Acr Palm Anti Sensory (2nd Digit)  31.1C  Wrist    *4.1 <3.6 32.2 >10 Wrist Palm 2.4 0.0    Palm    1.7 <2.0 19.1         Left Radial Anti Sensory (Base 1st Digit)  31.3C  Wrist    2.2 <3.1 32.6  Wrist Base 1st Digit 2.2 0.0    Left Ulnar Anti Sensory (5th Digit)  31.4C  Wrist    3.1 <3.7 29.7 >15.0 Wrist 5th Digit 3.1 14.0 45 >38   Motor Summary Table   Stim Site NR Onset (ms) Norm Onset (ms) O-P Amp (mV) Norm O-P Amp Site1 Site2 Delta-0 (ms) Dist (cm) Vel (m/s) Norm Vel (m/s)  Left Median Motor (Abd Poll Brev)  31.3C  Wrist    4.2 <4.2 9.9 >5 Elbow Wrist 3.6 18.5 51 >50  Elbow    7.8  10.1         Left Ulnar Motor (Abd Dig Min)  31C  Wrist    2.6 <4.2 9.1 >3 B Elbow Wrist 3.2 18.0 56 >53  B Elbow    5.8  9.0  A Elbow B Elbow 0.9 9.5 106 >53  A  Elbow    6.7  9.1          EMG   Side Muscle Nerve Root Ins Act Fibs Psw Amp Dur Poly Recrt Int Fraser Din Comment  Left Abd Poll Brev Median C8-T1 Nml Nml Nml Nml Nml 0 Nml Nml   Left 1stDorInt Ulnar C8-T1 Nml Nml Nml Nml Nml 0 Nml Nml   Left PronatorTeres Median C6-7 Nml Nml Nml Nml Nml 0 Nml Nml     Nerve Conduction Studies Anti Sensory Left/Right Comparison   Stim Site L Lat (ms) R Lat (ms) L-R Lat (ms) L Amp (V) R Amp (V) L-R Amp (%) Site1 Site2 L Vel (m/s) R Vel (m/s) L-R Vel (m/s)  Median Acr Palm Anti Sensory (2nd Digit)  31.1C  Wrist *4.1   32.2   Wrist Palm     Palm 1.7   19.1         Radial Anti Sensory (Base 1st Digit)  31.3C  Wrist 2.2   32.6   Wrist Base 1st Digit     Ulnar Anti Sensory (5th Digit)  31.4C  Wrist 3.1   29.7   Wrist 5th Digit 45     Motor Left/Right Comparison  Stim Site L Lat (ms) R Lat (ms) L-R Lat (ms) L Amp (mV) R Amp (mV) L-R Amp (%) Site1 Site2 L Vel (m/s) R Vel (m/s) L-R Vel (m/s)  Median Motor (Abd Poll Brev)  31.3C  Wrist 4.2   9.9   Elbow Wrist 51    Elbow 7.8   10.1         Ulnar Motor (Abd Dig Min)  31C  Wrist 2.6   9.1   B Elbow Wrist 56    B Elbow 5.8   9.0   A Elbow B Elbow 106    A Elbow 6.7   9.1            Waveforms:

## 2020-07-30 NOTE — Progress Notes (Signed)
Rebecca Boyd - 45 y.o. female MRN [id number]  Date of birth: October 17, 1975  Office Visit Note: Visit Date: 07/30/2020 PCP: Fayrene Helper, MD Referred by: Fayrene Helper, MD  Subjective: Chief Complaint  Patient presents with  . Left Hand - Numbness   HPI:  Rebecca Boyd is a 45 y.o. female who comes in today at the request of Dr. Arther Abbott for electrodiagnostic study of the Left upper extremities.  Patient is Right hand dominant.  She reports 8 out of 10 pain numbness and tingling in the left hand somewhat globally.  She does report nocturnal complaints with positive flick sign.  She has some symptoms with certain positions and movement.  This has not responded to conservative care including medication management.  No prior electrodiagnostic studies.   ROS Otherwise per HPI.  Assessment & Plan: Visit Diagnoses:    ICD-10-CM   1. Paresthesia of skin  R20.2 NCV with EMG (electromyography)    Plan: Impression: The above electrodiagnostic study is ABNORMAL and reveals evidence of a mild to moderate left median nerve entrapment at the wrist (carpal tunnel syndrome) affecting sensory components.   There is no significant electrodiagnostic evidence of any other focal nerve entrapment, brachial plexopathy or cervical radiculopathy.   Recommendations: 1.  Follow-up with referring physician. 2.  Continue current management of symptoms. 3.  Continue use of resting splint at night-time and as needed during the day.  Meds & Orders: No orders of the defined types were placed in this encounter.   Orders Placed This Encounter  Procedures  . NCV with EMG (electromyography)    Follow-up: Return in 2 weeks (on 08/13/2020) for Arther Abbott, MD.   Procedures: No procedures performed  Evaluation of the left median (across palm) sensory nerve showed prolonged distal peak latency (Wrist, 4.1 ms).  All remaining nerves (as indicated in the following tables) were within  normal limits.    All examined muscles (as indicated in the following table) showed no evidence of electrical instability.    Impression: The above electrodiagnostic study is ABNORMAL and reveals evidence of a mild to moderate left median nerve entrapment at the wrist (carpal tunnel syndrome) affecting sensory components.   There is no significant electrodiagnostic evidence of any other focal nerve entrapment, brachial plexopathy or cervical radiculopathy.   Recommendations: 1.  Follow-up with referring physician. 2.  Continue current management of symptoms. 3.  Continue use of resting splint at night-time and as needed during the day.  ___________________________ Wonda Olds Board Certified, American Board of Physical Medicine and Rehabilitation    Nerve Conduction Studies Anti Sensory Summary Table   Stim Site NR Peak (ms) Norm Peak (ms) P-T Amp (V) Norm P-T Amp Site1 Site2 Delta-P (ms) Dist (cm) Vel (m/s) Norm Vel (m/s)  Left Median Acr Palm Anti Sensory (2nd Digit)  31.1C  Wrist    *4.1 <3.6 32.2 >10 Wrist Palm 2.4 0.0    Palm    1.7 <2.0 19.1         Left Radial Anti Sensory (Base 1st Digit)  31.3C  Wrist    2.2 <3.1 32.6  Wrist Base 1st Digit 2.2 0.0    Left Ulnar Anti Sensory (5th Digit)  31.4C  Wrist    3.1 <3.7 29.7 >15.0 Wrist 5th Digit 3.1 14.0 45 >38   Motor Summary Table   Stim Site NR Onset (ms) Norm Onset (ms) O-P Amp (mV) Norm O-P Amp Site1 Site2 Delta-0 (ms) Dist (cm) Vel (  m/s) Norm Vel (m/s)  Left Median Motor (Abd Poll Brev)  31.3C  Wrist    4.2 <4.2 9.9 >5 Elbow Wrist 3.6 18.5 51 >50  Elbow    7.8  10.1         Left Ulnar Motor (Abd Dig Min)  31C  Wrist    2.6 <4.2 9.1 >3 B Elbow Wrist 3.2 18.0 56 >53  B Elbow    5.8  9.0  A Elbow B Elbow 0.9 9.5 106 >53  A Elbow    6.7  9.1          EMG   Side Muscle Nerve Root Ins Act Fibs Psw Amp Dur Poly Recrt Int Fraser Din Comment  Left Abd Poll Brev Median C8-T1 Nml Nml Nml Nml Nml 0 Nml Nml   Left  1stDorInt Ulnar C8-T1 Nml Nml Nml Nml Nml 0 Nml Nml   Left PronatorTeres Median C6-7 Nml Nml Nml Nml Nml 0 Nml Nml     Nerve Conduction Studies Anti Sensory Left/Right Comparison   Stim Site L Lat (ms) R Lat (ms) L-R Lat (ms) L Amp (V) R Amp (V) L-R Amp (%) Site1 Site2 L Vel (m/s) R Vel (m/s) L-R Vel (m/s)  Median Acr Palm Anti Sensory (2nd Digit)  31.1C  Wrist *4.1   32.2   Wrist Palm     Palm 1.7   19.1         Radial Anti Sensory (Base 1st Digit)  31.3C  Wrist 2.2   32.6   Wrist Base 1st Digit     Ulnar Anti Sensory (5th Digit)  31.4C  Wrist 3.1   29.7   Wrist 5th Digit 45     Motor Left/Right Comparison   Stim Site L Lat (ms) R Lat (ms) L-R Lat (ms) L Amp (mV) R Amp (mV) L-R Amp (%) Site1 Site2 L Vel (m/s) R Vel (m/s) L-R Vel (m/s)  Median Motor (Abd Poll Brev)  31.3C  Wrist 4.2   9.9   Elbow Wrist 51    Elbow 7.8   10.1         Ulnar Motor (Abd Dig Min)  31C  Wrist 2.6   9.1   B Elbow Wrist 56    B Elbow 5.8   9.0   A Elbow B Elbow 106    A Elbow 6.7   9.1            Waveforms:             Clinical History: No specialty comments available.     Objective:  VS:  HT:    WT:   BMI:     BP:   HR: bpm  TEMP: ( )  RESP:  Physical Exam Musculoskeletal:        General: No swelling, tenderness or deformity.     Comments: Inspection reveals no atrophy of the bilateral APB or FDI or hand intrinsics. There is no swelling, color changes, allodynia or dystrophic changes. There is 5 out of 5 strength in the bilateral wrist extension, finger abduction and long finger flexion. There is intact sensation to light touch in all dermatomal and peripheral nerve distributions.  There is a negative Hoffmann's test bilaterally.  Skin:    General: Skin is warm and dry.     Findings: No erythema or rash.  Neurological:     General: No focal deficit present.     Mental Status: She is alert and oriented to person, place,  and time.     Motor: No weakness or abnormal muscle tone.      Coordination: Coordination normal.  Psychiatric:        Mood and Affect: Mood normal.        Behavior: Behavior normal.      Imaging: No results found.

## 2020-07-30 NOTE — Progress Notes (Signed)
Numbness and tingling left hand. All fingers. Worse at night.  Right hand dominant Lotion Numeric Pain Rating Scale and Functional Assessment Average Pain 8   In the last MONTH (on 0-10 scale) has pain interfered with the following?  1. General activity like being  able to carry out your everyday physical activities such as walking, climbing stairs, carrying groceries, or moving a chair?  Rating(6)

## 2020-08-02 NOTE — Telephone Encounter (Signed)
Not signing also, not severe enough disease

## 2020-08-02 NOTE — Telephone Encounter (Signed)
Rebecca Boyd would not sign the card due to non necessity. Do you think she needs this?

## 2020-08-03 ENCOUNTER — Encounter: Payer: Self-pay | Admitting: Family Medicine

## 2020-08-04 NOTE — Telephone Encounter (Signed)
Pt made aware by response to her mychart message

## 2020-08-11 ENCOUNTER — Encounter (HOSPITAL_COMMUNITY): Payer: Self-pay | Admitting: Emergency Medicine

## 2020-08-11 ENCOUNTER — Other Ambulatory Visit: Payer: Self-pay

## 2020-08-11 ENCOUNTER — Emergency Department (HOSPITAL_COMMUNITY)
Admission: EM | Admit: 2020-08-11 | Discharge: 2020-08-11 | Disposition: A | Discharge status: Home / Self Care | Payer: 59 | Attending: Physician Assistant | Admitting: Physician Assistant

## 2020-08-11 DIAGNOSIS — Z7951 Long term (current) use of inhaled steroids: Secondary | ICD-10-CM | POA: Insufficient documentation

## 2020-08-11 DIAGNOSIS — Z87891 Personal history of nicotine dependence: Secondary | ICD-10-CM | POA: Insufficient documentation

## 2020-08-11 DIAGNOSIS — I10 Essential (primary) hypertension: Secondary | ICD-10-CM | POA: Insufficient documentation

## 2020-08-11 DIAGNOSIS — J029 Acute pharyngitis, unspecified: Secondary | ICD-10-CM | POA: Insufficient documentation

## 2020-08-11 DIAGNOSIS — Z79899 Other long term (current) drug therapy: Secondary | ICD-10-CM | POA: Insufficient documentation

## 2020-08-11 DIAGNOSIS — Z20822 Contact with and (suspected) exposure to covid-19: Secondary | ICD-10-CM | POA: Diagnosis not present

## 2020-08-11 DIAGNOSIS — J45909 Unspecified asthma, uncomplicated: Secondary | ICD-10-CM | POA: Insufficient documentation

## 2020-08-11 DIAGNOSIS — Z91040 Latex allergy status: Secondary | ICD-10-CM | POA: Diagnosis not present

## 2020-08-11 DIAGNOSIS — R070 Pain in throat: Secondary | ICD-10-CM | POA: Diagnosis present

## 2020-08-11 LAB — SARS CORONAVIRUS 2 (TAT 6-24 HRS): SARS Coronavirus 2: NEGATIVE

## 2020-08-11 LAB — GROUP A STREP BY PCR: Group A Strep by PCR: NOT DETECTED

## 2020-08-11 NOTE — Discharge Instructions (Addendum)
Tylenol every 4 hours.  Return if any problems.  

## 2020-08-11 NOTE — ED Notes (Signed)
Pt left before receiving d/c papers

## 2020-08-11 NOTE — ED Provider Notes (Signed)
[facility] EMERGENCY DEPARTMENT Provider Note   CSN: [id number] Arrival date & time: 08/11/20  1045     History Chief Complaint  Patient presents with  . Sore Throat    Rebecca Boyd is a 45 y.o. female.  The history is provided by the patient. No language interpreter was used.  Sore Throat This is a new problem. The current episode started more than 2 days ago. The problem occurs hourly. The problem has been gradually worsening. Pertinent negatives include no chest pain and no headaches. Nothing aggravates the symptoms. Nothing relieves the symptoms. She has tried nothing for the symptoms. The treatment provided no relief.       Past Medical History:  Diagnosis Date  . Anemia   . Anxiety   . Bacterial vaginosis 07/27/2014  . Cervical neck pain with evidence of disc disease 10/02/2013  . Depressive disorder, not elsewhere classified   . Family history of breast cancer   . Family history of breast cancer in sister 09/25/2018  . Family history of colon cancer   . Family history of lung cancer   . FH: breast cancer in relative when <93 years old 06/10/2018   Dx at age 34  . Headache 03/16/2016  . 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 Hypertension   . Migraine   . Migraines   . Miscarriage 2009  . Neck pain   . Nondisplaced fracture of fifth metatarsal bone, left foot, subsequent encounter for fracture with routine healing 08/15/18 09/25/2018  . Pruritus 09/29/2018  . Pruritus ani 09/25/2018  . Rash and nonspecific skin eruption 05/14/2017  . Vaginitis    cyctitis    Patient Active Problem List   Diagnosis Date Noted  . Numbness and tingling of left hand 06/08/2020  . Fatigue 04/15/2020  . 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 Asthma 10/29/2019  . Frequent nosebleeds 07/11/2019  . Hypokalemia 07/11/2019  . Family history of lung cancer   . Breast pain, left 02/05/2019  . 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 Rash and  nonspecific skin eruption 05/14/2017  . Chronic migraine 12/06/2016  . Genetic testing 05/10/2016  . Obesity (BMI 30.0-34.9) 03/17/2015  . Essential hypertension, benign 01/07/2014  . Low back pain with radiation 07/30/2013  . Vitamin D deficiency 10/27/2010  . Anxiety 10/26/2010  . Neck pain 07/04/2007    Past Surgical History:  Procedure Laterality Date  . cervical cryotherapy N/A 1999  . DILITATION & CURRETTAGE/HYSTROSCOPY WITH THERMACHOICE ABLATION  08/26/2013   Procedure: DILATATION & CURETTAGE/HYSTEROSCOPY WITH THERMACHOICE ENDOMETRIAL ABLATION Procedure #2 Total Therapy Time=min       sec;  Surgeon: Jonnie Kind, MD;  Location: [facility];  Service: Gynecology;;  . LAPAROSCOPIC BILATERAL SALPINGECTOMY Bilateral 08/26/2013   Procedure: LAPAROSCOPIC BILATERAL SALPINGECTOMY AND REMOVAL OF LEFT PERITUBAL CYST Procedure #1;  Surgeon: Jonnie Kind, MD;  Location: [facility];  Service: Gynecology;  Laterality: Bilateral;  . LAPAROSCOPIC LYSIS OF ADHESIONS  08/26/2013   Procedure: LAPAROSCOPIC LYSIS OF ADHESIONS Procedure #1;  Surgeon: Jonnie Kind, MD;  Location: [facility];  Service: Gynecology;;  . Carmelia Bake LESION REMOVAL  08/26/2013   Procedure: REMOVAL OF VULVAR SEBACEOUS CYST Procedure #3;  Surgeon: Jonnie Kind, MD;  Location: [facility];  Service: Gynecology;;     OB History   No obstetric history on file.     Family History  Problem Relation Age of Onset  . Drug abuse Mother   .  Hypertension Father   . Breast cancer Sister        dx 79  . Hypertension Maternal Grandmother   . Hypertension Maternal Grandfather   . Hypertension Paternal Grandmother   . Breast cancer Paternal Grandmother        dx late 49s  . Colon cancer Cousin 79    Social History   Tobacco Use  . Smoking status: Former Smoker    Packs/day: 0.00    Years: 1.00    Pack years: 0.00    Types: Cigars    Quit date: 01/05/2017    Years since quitting: 3.6  . Smokeless tobacco: Never Used  Vaping Use  .  Vaping Use: Never used  Substance Use Topics  . Alcohol use: Yes    Comment: occasional  . Drug use: No    Home Medications Prior to Admission medications   Medication Sig Start Date End Date Taking? Authorizing Provider  ALBUTEROL SULFATE HFA IN Inhale 2 puffs into the lungs. Patient not taking: No sig reported    [provider]  amLODipine (NORVASC) 10 MG tablet Take 1 tablet (10 mg total) by mouth daily. 02/11/20   Fayrene Helper, MD  betamethasone dipropionate 0.05 % cream Apply topically 2 (two) times daily. 06/14/20   Fayrene Helper, MD  buPROPion (WELLBUTRIN XL) 150 MG 24 hr tablet Take 1 tablet (150 mg total) by mouth daily. 06/16/20   [name], MD  colchicine 0.6 MG tablet Take 1 tablet (0.6 mg total) by mouth See admin instructions. Take 2 tablets PO now, then take 1 tablet PO in 1 hour Patient not taking: No sig reported 04/26/20   Noreene Larsson, NP  Erenumab-aooe (AIMOVIG) 70 MG/ML SOAJ Inject 70 mg into the skin every 30 (thirty) days. 08/12/19   Suzzanne Cloud, NP  ergocalciferol (VITAMIN D2) 1.25 MG (50000 UT) capsule Take 1 capsule (50,000 Units total) by mouth once a week. One capsule once weekly 03/22/20   Fayrene Helper, MD  gabapentin (NEURONTIN) 300 MG capsule Take 1 capsule (300 mg total) by mouth at bedtime. 06/17/20   Carole Civil, MD  oxyCODONE-acetaminophen (PERCOCET/ROXICET) 5-325 MG tablet Take 1-2 tablets by mouth 2 (two) times daily as needed.  03/10/20   [provider]  SUMAtriptan (IMITREX) 6 MG/0.5ML SOLN injection Use one injection at onset of migraine.  May repeat in 2 hrs, if needed.  Max dose: 2 inj/day. This is a 30 day prescription. 08/12/19   Suzzanne Cloud, NP  topiramate (TOPAMAX) 100 MG tablet Take 1 tablet (100 mg total) by mouth 2 (two) times daily. 08/12/19   Suzzanne Cloud, NP  triamterene-hydrochlorothiazide (MAXZIDE) 75-50 MG tablet Take 1 tablet by mouth daily. 02/11/20   Fayrene Helper, MD   fluticasone (FLONASE) 50 MCG/ACT nasal spray Place 2 sprays into both nostrils daily. 04/10/19 07/08/19  Perlie Mayo, NP    Allergies    Diphenhydramine hcl, Orange fruit, and Latex  Review of Systems   Review of Systems  Cardiovascular: Negative for chest pain.  Neurological: Negative for headaches.  All other systems reviewed and are negative.   Physical Exam Updated Vital Signs BP (!) 134/98 (BP Location: Right Arm)   Pulse 90   Temp 99.2 F (37.3 C) (Oral)   Resp 17   LMP 07/30/2020   SpO2 100%   Physical Exam Vitals and nursing note reviewed.  Constitutional:      Appearance: She is well-developed.  HENT:  Head: Normocephalic.     Right Ear: Tympanic membrane normal.     Left Ear: Tympanic membrane normal.     Mouth/Throat:     Pharynx: Pharyngeal swelling and posterior oropharyngeal erythema present.  Cardiovascular:     Rate and Rhythm: Normal rate.  Pulmonary:     Effort: Pulmonary effort is normal.  Abdominal:     General: There is no distension.  Musculoskeletal:        General: Normal range of motion.     Cervical back: Normal range of motion.  Skin:    General: Skin is warm.  Neurological:     General: No focal deficit present.     Mental Status: She is alert and oriented to person, place, and time.     ED Results / Procedures / Treatments   Labs (all labs ordered are listed, but only abnormal results are displayed) Labs Reviewed  GROUP A STREP BY PCR  SARS CORONAVIRUS 2 (TAT 6-24 HRS)    EKG None  Radiology No results found.  Procedures Procedures   Medications Ordered in ED Medications - No data to display  ED Course  I have reviewed the triage vital signs and the nursing notes.  Pertinent labs & imaging results that were available during my care of the patient were reviewed by me and considered in my medical decision making (see chart for details).    MDM Rules/Calculators/A&P                          MDM:  Strep  negative, covid pending, Pt counseled on symptomatic mangement  Final Clinical Impression(s) / ED Diagnoses Final diagnoses:  Viral pharyngitis    Rx / DC Orders ED Discharge Orders    None    An After Visit Summary was printed and given to the patient.    Fransico Meadow, [place] 08/11/20 1448    Lajean Saver, MD 08/12/20 [phone]

## 2020-08-11 NOTE — ED Triage Notes (Signed)
Headache, sore throat, back pain and fatigue since yesterday .

## 2020-08-11 NOTE — ED Notes (Addendum)
Sore throat, headache, back pain and fatigue. Reports no fevers.

## 2020-08-12 ENCOUNTER — Ambulatory Visit: Payer: 59 | Admitting: Neurology

## 2020-08-12 ENCOUNTER — Encounter: Payer: Self-pay | Admitting: Family Medicine

## 2020-08-12 ENCOUNTER — Telehealth (INDEPENDENT_AMBULATORY_CARE_PROVIDER_SITE_OTHER): Payer: 59 | Admitting: Internal Medicine

## 2020-08-12 ENCOUNTER — Encounter: Payer: Self-pay | Admitting: Internal Medicine

## 2020-08-12 DIAGNOSIS — J069 Acute upper respiratory infection, unspecified: Secondary | ICD-10-CM | POA: Diagnosis not present

## 2020-08-12 MED ORDER — AZITHROMYCIN 250 MG PO TABS: ORAL_TABLET | ORAL | 0 refills | Status: DC

## 2020-08-12 NOTE — Patient Instructions (Addendum)
Please start taking Azithromycin as prescribed.  Use nasal saline spray for nasal congestion. Please perform warm/salt water gargles. Please use Albuterol as needed for dyspnea/wheezing. Continue Mucinex as needed for cough and chest congestion.

## 2020-08-12 NOTE — Progress Notes (Signed)
Virtual Visit via Telephone Note   This visit type was conducted due to national recommendations for restrictions regarding the COVID-19 Pandemic (e.g. social distancing) in an effort to limit this patient's exposure and mitigate transmission in our community.  Due to her co-morbid illnesses, this patient is at least at moderate risk for complications without adequate follow up.  This format is felt to be most appropriate for this patient at this time.  The patient did not have access to video technology/had technical difficulties with video requiring transitioning to audio format only (telephone).  All issues noted in this document were discussed and addressed.  No physical exam could be performed with this format.   Evaluation Performed:  Follow-up visit  Date:  08/12/2020   ID:  ISAAC LACSON, DOB 1975-07-31, MRN [id number]  Patient Location: Home Provider Location: Office/Clinic  Location of Patient: Home Location of Provider: Telehealth Consent was obtain for visit to be over via telehealth. I verified that I am speaking with the correct person using two identifiers.  PCP:  Fayrene Helper, MD   Chief Complaint:  Sore throat and nasal congestion  History of Present Illness:    Rebecca Boyd is a 45 y.o. female who has a televisit for c/o sore throat and nasal congestion for last 3 days, which has been getting worse. She had negative COVID test in ER. She denies any fever or chills, but has been having dry cough and fatigue. She denies any recent sick contacts. She has mild dyspnea, better with Albuterol inhaler. She also c/o mild sinus pressure around nostrils.  The patient does have symptoms concerning for COVID-19 infection (fever, chills, cough, or new shortness of breath).   Past Medical, Surgical, Social History, Allergies, and Medications have been Reviewed.  Past Medical History:  Diagnosis Date  . Anemia   . Anxiety   . Bacterial vaginosis 07/27/2014  .  Cervical neck pain with evidence of disc disease 10/02/2013  . Depressive disorder, not elsewhere classified   . Family history of breast cancer   . Family history of breast cancer in sister 09/25/2018  . Family history of colon cancer   . Family history of lung cancer   . FH: breast cancer in relative when <29 years old 06/10/2018   Dx at age 22  . Headache 03/16/2016  . 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 Hypertension   . Migraine   . Migraines   . Miscarriage 2009  . Neck pain   . Nondisplaced fracture of fifth metatarsal bone, left foot, subsequent encounter for fracture with routine healing 08/15/18 09/25/2018  . Pruritus 09/29/2018  . Pruritus ani 09/25/2018  . Rash and nonspecific skin eruption 05/14/2017  . Vaginitis    cyctitis   Past Surgical History:  Procedure Laterality Date  . cervical cryotherapy N/A 1999  . DILITATION & CURRETTAGE/HYSTROSCOPY WITH THERMACHOICE ABLATION  08/26/2013   Procedure: DILATATION & CURETTAGE/HYSTEROSCOPY WITH THERMACHOICE ENDOMETRIAL ABLATION Procedure #2 Total Therapy Time=min       sec;  Surgeon: Jonnie Kind, MD;  Location: [facility];  Service: Gynecology;;  . LAPAROSCOPIC BILATERAL SALPINGECTOMY Bilateral 08/26/2013   Procedure: LAPAROSCOPIC BILATERAL SALPINGECTOMY AND REMOVAL OF LEFT PERITUBAL CYST Procedure #1;  Surgeon: Jonnie Kind, MD;  Location: [facility];  Service: Gynecology;  Laterality: Bilateral;  . LAPAROSCOPIC LYSIS OF ADHESIONS  08/26/2013   Procedure: LAPAROSCOPIC LYSIS OF ADHESIONS Procedure #1;  Surgeon: Jonnie Kind, MD;  Location: [facility];  Service: Gynecology;;  . VULVAR LESION REMOVAL  08/26/2013   Procedure: REMOVAL OF VULVAR SEBACEOUS CYST Procedure #3;  Surgeon: Jonnie Kind, MD;  Location: [facility];  Service: Gynecology;;     Current Meds  Medication Sig  . ALBUTEROL SULFATE HFA IN Inhale 2 puffs into the lungs.  Marland Kitchen amLODipine (NORVASC) 10 MG tablet Take 1 tablet (10 mg total) by mouth daily.  Marland Kitchen  azithromycin (ZITHROMAX) 250 MG tablet Take as package instructions.  . betamethasone dipropionate 0.05 % cream Apply topically 2 (two) times daily.  Marland Kitchen buPROPion (WELLBUTRIN XL) 150 MG 24 hr tablet Take 1 tablet (150 mg total) by mouth daily.  . colchicine 0.6 MG tablet Take 1 tablet (0.6 mg total) by mouth See admin instructions. Take 2 tablets PO now, then take 1 tablet PO in 1 hour  . Erenumab-aooe (AIMOVIG) 70 MG/ML SOAJ Inject 70 mg into the skin every 30 (thirty) days.  . ergocalciferol (VITAMIN D2) 1.25 MG (50000 UT) capsule Take 1 capsule (50,000 Units total) by mouth once a week. One capsule once weekly  . gabapentin (NEURONTIN) 300 MG capsule Take 1 capsule (300 mg total) by mouth at bedtime.  Marland Kitchen oxyCODONE-acetaminophen (PERCOCET/ROXICET) 5-325 MG tablet Take 1-2 tablets by mouth 2 (two) times daily as needed.   . SUMAtriptan (IMITREX) 6 MG/0.5ML SOLN injection Use one injection at onset of migraine.  May repeat in 2 hrs, if needed.  Max dose: 2 inj/day. This is a 30 day prescription.  . triamterene-hydrochlorothiazide (MAXZIDE) 75-50 MG tablet Take 1 tablet by mouth daily.     Allergies:   Diphenhydramine hcl, Orange fruit, and Latex   ROS:   Please see the history of present illness.     All other systems reviewed and are negative.   Labs/Other Tests and Data Reviewed:    Recent Labs: 03/18/2020: Hemoglobin 13.9; Platelets 217; TSH 0.951 03/23/2020: BUN 9; Creatinine, Ser 0.65; Potassium 3.1; Sodium 136   Recent Lipid Panel Lab Results  Component Value Date/Time   CHOL 192 03/18/2020 09:38 AM   TRIG 76 03/18/2020 09:38 AM   HDL 45 03/18/2020 09:38 AM   CHOLHDL 4.3 03/18/2020 09:38 AM   CHOLHDL 4.3 06/10/2018 08:31 AM   LDLCALC 133 (H) 03/18/2020 09:38 AM   LDLCALC 118 (H) 06/10/2018 08:31 AM    Wt Readings from Last 3 Encounters:  07/26/20 178 lb (80.7 kg)  06/17/20 178 lb (80.7 kg)  06/14/20 180 lb (81.6 kg)      ASSESSMENT & PLAN:    URTI Started  Azithromycin as persistent/worsening symptoms Nasal saline spray for nasal congestion Warm/salt water gargles Albuterol PRN for dyspnea/wheezing Continue Mucinex PRN   Time:   Today, I have spent 9 minutes reviewing the chart, including problem list, medications, and with the patient with telehealth technology discussing the above problems.   Medication Adjustments/Labs and Tests Ordered: Current medicines are reviewed at length with the patient today.  Concerns regarding medicines are outlined above.   Tests Ordered: No orders of the defined types were placed in this encounter.   Medication Changes: Meds ordered this encounter  Medications  . azithromycin (ZITHROMAX) 250 MG tablet    Sig: Take as package instructions.    Dispense:  6 tablet    Refill:  0     Note: This dictation was prepared with Dragon dictation along with smaller phrase technology. Similar sounding words can be transcribed inadequately or may not be corrected upon review. Any transcriptional errors  that result from this process are unintentional.      Disposition:  Follow up  Signed, Lindell Spar, MD  08/12/2020 1:32 PM     [facility]

## 2020-08-15 ENCOUNTER — Encounter: Payer: Self-pay | Admitting: Family Medicine

## 2020-08-16 NOTE — Progress Notes (Signed)
Virtual Visit via Video Note  I connected with Rebecca Boyd on 08/19/20 at  9:20 AM EDT by a video enabled telemedicine application and verified that I am speaking with the correct person using two identifiers.  Location: Patient: home Provider: office   I discussed the limitations of evaluation and management by telemedicine and the availability of in person appointments. The patient expressed understanding and agreed to proceed.   I discussed the assessment and treatment plan with the patient. The patient was provided an opportunity to ask questions and all were answered. The patient agreed with the plan and demonstrated an understanding of the instructions.   The patient was advised to call back or seek an in-person evaluation if the symptoms worsen or if the condition fails to improve as anticipated.  I provided 12 minutes of non-face-to-face time during this encounter.   Rebecca Clay, MD     [facility] MD/PA/NP OP Progress Note  08/19/2020 9:50 AM RAJANAE Boyd  MRN:  [id number]  Chief Complaint:  Chief Complaint    Follow-up; Anxiety     HPI:  This is a follow-up appointment for depression, anxiety, and fatigue.  She states that she has been doing fine.  Her mother was sick due to sepsis, although she has been doing much better.  Her sister has been doing well.  She works night shift, and has days off every other weekend.  She tends to watch TV when she has time.  She may take a walk at times.  She agrees to do it more routinely.  Although she recalls that she was told she may need CPAP machine, she has not heard back from the clinic since then.  She agrees to contact the clinic.  She has depressive symptoms as in PHQ-9.  She denies SI.  She feels anxious and tense.  She denies panic attacks.  Although she reports 30 pounds of weight gain, her weight has been the same since last August according to the chart.  She tends to eat unhealthy diet.  She has been working on this.   She states that she is on many other medication, and she is not interested in up titration of bupropion.  She has occasional leg edema.   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 GAD (generalized anxiety disorder)  F41.1   2. MDD (major depressive disorder), recurrent episode, mild ([place])  F33.0   3. Fatigue, unspecified type  R53.83   4. Obstructive sleep apnea  G47.33     Past Psychiatric History: Please see initial evaluation for full details. I have reviewed the history. No updates at this time.     Past Medical History:  Past Medical History:  Diagnosis Date  . Anemia   . Anxiety   . Bacterial vaginosis 07/27/2014  . Cervical neck pain with evidence of disc disease 10/02/2013  . Depressive disorder, not elsewhere classified   . Family history of breast cancer   . Family history of breast cancer in sister 09/25/2018  .  Family history of colon cancer   . Family history of lung cancer   . FH: breast cancer in relative when <21 years old 06/10/2018   Dx at age 41  . Headache 03/16/2016  . 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 Hypertension   . Migraine   . Migraines   . Miscarriage 2009  . Neck pain   . Nondisplaced fracture of fifth metatarsal bone, left foot, subsequent encounter for fracture with routine healing 08/15/18 09/25/2018  . Pruritus 09/29/2018  . Pruritus ani 09/25/2018  . Rash and nonspecific skin eruption 05/14/2017  . Vaginitis    cyctitis    Past Surgical History:  Procedure Laterality  Date  . cervical cryotherapy N/A 1999  . DILITATION & CURRETTAGE/HYSTROSCOPY WITH THERMACHOICE ABLATION  08/26/2013   Procedure: DILATATION & CURETTAGE/HYSTEROSCOPY WITH THERMACHOICE ENDOMETRIAL ABLATION Procedure #2 Total Therapy Time=min       sec;  Surgeon: Jonnie Kind, MD;  Location: [facility];  Service: Gynecology;;  . LAPAROSCOPIC BILATERAL SALPINGECTOMY Bilateral 08/26/2013   Procedure: LAPAROSCOPIC BILATERAL SALPINGECTOMY AND REMOVAL OF LEFT PERITUBAL CYST Procedure #1;  Surgeon: Jonnie Kind, MD;  Location: [facility];  Service: Gynecology;  Laterality: Bilateral;  . LAPAROSCOPIC LYSIS OF ADHESIONS  08/26/2013   Procedure: LAPAROSCOPIC LYSIS OF ADHESIONS Procedure #1;  Surgeon: Jonnie Kind, MD;  Location: [facility];  Service: Gynecology;;  . Carmelia Bake LESION REMOVAL  08/26/2013   Procedure: REMOVAL OF VULVAR SEBACEOUS CYST Procedure #3;  Surgeon: Jonnie Kind, MD;  Location: [facility];  Service: Gynecology;;    Family Psychiatric History: Please see initial evaluation for full details. I have reviewed the history. No updates at this time.     Family History:  Family History  Problem Relation Age of Onset  . Drug abuse Mother   . Hypertension Father   . Breast cancer Sister        dx 7  . Hypertension Maternal Grandmother   . Hypertension Maternal Grandfather   . Hypertension Paternal Grandmother   . Breast cancer Paternal Grandmother        dx late 29s  . Colon cancer Cousin 36    Social History:  Social History   Socioeconomic History  . Marital status: Single    Spouse name: Not on file  . Number of children: Not on file  . Years of education: Not on file  . Highest education level: Not on file  Occupational History  . Not on file  Tobacco Use  . Smoking status: Former Smoker    Packs/day: 0.00    Years: 1.00    Pack years: 0.00    Types: Cigars    Quit date: 01/05/2017    Years since quitting: 3.6  . Smokeless tobacco: Never Used  Vaping Use  . Vaping Use:  Never used  Substance and Sexual Activity  . Alcohol use: Yes    Comment: occasional  . Drug use: No  . Sexual activity: Yes    Birth control/protection: None  Other Topics Concern  . Not on file  Social History Narrative   Lives home with mother.  Works at [employer].  Education 10th grade/GED.  No children.  Single.     Social Determinants of Health   Financial Resource Strain: Not on file  Food Insecurity: Not on file  Transportation Needs: Not on file  Physical Activity: Not on file  Stress: Not on file  Social Connections: Not on file  Allergies:  Allergies  Allergen Reactions  . Diphenhydramine Hcl Anaphylaxis    (Benadryl)  . Orange Fruit Hives, Shortness Of Breath, Itching and Other (See Comments)    MAKES IT DIFFICULT TO BREATH  . Latex Rash    Metabolic Disorder Labs: Lab Results  Component Value Date   HGBA1C 5.3 03/18/2020   MPG 103 06/10/2018   MPG 108 05/29/2013   No results found for: PROLACTIN Lab Results  Component Value Date   CHOL 192 03/18/2020   TRIG 76 03/18/2020   HDL 45 03/18/2020   CHOLHDL 4.3 03/18/2020   VLDL 19 05/10/2016   LDLCALC 133 (H) 03/18/2020   LDLCALC 118 (H) 06/10/2018   Lab Results  Component Value Date   TSH 0.951 03/18/2020   TSH 1.67 06/10/2018    Therapeutic Level Labs: No results found for: LITHIUM No results found for: VALPROATE No components found for:  CBMZ  Current Medications: Current Outpatient Medications  Medication Sig Dispense Refill  . ALBUTEROL SULFATE HFA IN Inhale 2 puffs into the lungs.    Marland Kitchen amLODipine (NORVASC) 10 MG tablet Take 1 tablet (10 mg total) by mouth daily. 90 tablet 3  . azithromycin (ZITHROMAX) 250 MG tablet Take as package instructions. (Patient not taking: No sig reported) 6 tablet 0  . benzonatate (TESSALON PERLES) 100 MG capsule Take 1 capsule (100 mg total) by mouth every 6 (six) hours as needed for cough. 30 capsule 0  . betamethasone dipropionate 0.05 % cream  Apply topically 2 (two) times daily. 45 g 0  . [START ON 08/20/2020] buPROPion (WELLBUTRIN XL) 150 MG 24 hr tablet Take 1 tablet (150 mg total) by mouth daily. 30 tablet 1  . colchicine 0.6 MG tablet Take 1 tablet (0.6 mg total) by mouth See admin instructions. Take 2 tablets PO now, then take 1 tablet PO in 1 hour 3 tablet 0  . Erenumab-aooe (AIMOVIG) 70 MG/ML SOAJ Inject 70 mg into the skin every 30 (thirty) days. 1 pen 11  . ergocalciferol (VITAMIN D2) 1.25 MG (50000 UT) capsule Take 1 capsule (50,000 Units total) by mouth once a week. One capsule once weekly 12 capsule 2  . fluconazole (DIFLUCAN) 150 MG tablet Take one tablet once daily as needed for vaginal  Itch associated with antibiotic use 2 tablet 0  . gabapentin (NEURONTIN) 300 MG capsule Take 1 capsule (300 mg total) by mouth at bedtime. 42 capsule 0  . metroNIDAZOLE (METROGEL VAGINAL) 0.75 % vaginal gel Place 1 Applicatorful vaginally 2 (two) times daily. 70 g 2  . montelukast (SINGULAIR) 10 MG tablet Take 1 tablet (10 mg total) by mouth at bedtime. 30 tablet 3  . oxyCODONE-acetaminophen (PERCOCET/ROXICET) 5-325 MG tablet Take 1-2 tablets by mouth 2 (two) times daily as needed.     . penicillin v potassium (VEETID) 500 MG tablet Take 1 tablet (500 mg total) by mouth 3 (three) times daily. 30 tablet 0  . predniSONE (DELTASONE) 5 MG tablet Take 1 tablet (5 mg total) by mouth 2 (two) times daily for 5 days. 10 tablet 0  . SUMAtriptan (IMITREX) 6 MG/0.5ML SOLN injection Use one injection at onset of migraine.  May repeat in 2 hrs, if needed.  Max dose: 2 inj/day. This is a 30 day prescription. 5 mL 11  . topiramate (TOPAMAX) 100 MG tablet Take 1 tablet (100 mg total) by mouth 2 (two) times daily. 180 tablet 4  . triamterene-hydrochlorothiazide (MAXZIDE) 75-50 MG tablet Take 1 tablet by mouth daily. 30 tablet  4   No current facility-administered medications for this visit.     Musculoskeletal: Strength & Muscle Tone: N/A Gait & Station:  N/A Patient leans: N/A  Psychiatric Specialty Exam: Review of Systems  Psychiatric/Behavioral: Positive for dysphoric mood. Negative for agitation, behavioral problems, confusion, decreased concentration, hallucinations, self-injury, sleep disturbance and suicidal ideas. The patient is nervous/anxious. The patient is not hyperactive.   All other systems reviewed and are negative.   Last menstrual period 07/30/2020.There is no height or weight on file to calculate BMI.  General Appearance: Fairly Groomed  Eye Contact:  Good  Speech:  Clear and Coherent  Volume:  Normal  Mood:  Depressed  Affect:  Appropriate, Congruent and fatigue  Thought Process:  Coherent  Orientation:  Full (Time, Place, and Person)  Thought Content: Logical   Suicidal Thoughts:  No  Homicidal Thoughts:  No  Memory:  Immediate;   Good  Judgement:  Good  Insight:  Fair  Psychomotor Activity:  Normal  Concentration:  Concentration: Good and Attention Span: Good  Recall:  Good  Fund of Knowledge: Good  Language: Good  Akathisia:  No  Handed:  Right  AIMS (if indicated): not done  Assets:  Communication Skills Desire for Improvement  ADL's:  Intact  Cognition: WNL  Sleep:  Fair   Screenings: GAD-7   [place] Office Visit from 12/10/2019 in [facility] Office Visit from 10/28/2019 in [facility] Office Visit from 09/25/2019 in [facility] Office Visit from 05/21/2019 in [facility]  Total GAD-7 Score 14 15 6 18     PHQ2-9   Flowsheet Row Video Visit from 08/19/2020 in [facility] Office Visit from 08/17/2020 in [facility] Video Visit from 08/12/2020 in [facility] Video Visit from 06/14/2020 in [facility] Visit from 06/08/2020 in [facility]  PHQ-2 Total Score 4 0 0 2 2  PHQ-9 Total Score 11 -- -- 3 3    Flowsheet Row Video Visit from 08/19/2020 in [facility] from 08/11/2020 in [facility] No Risk No Risk       Assessment and Plan:  Rebecca Boyd is a 45 y.o. year old female with a history of Depressive Disorder NOS and Psychotic Disorder NOS, hypertension,migraine, who presents for follow up appointment for below.   1. GAD (generalized anxiety disorder) 2. MDD (major depressive disorder), recurrent episode, mild ([place]) She continues to report anxiety and depressive symptoms without significant triggers. Psychosocial stressors includes loss of her maternal uncle, taking care of her mother with COPD, and her sister with breast cancer.  Although she will benefit from up titration of bupropion, she would like to stay on the same dose as she feels she is taking many other medication.  We will continue the current dose to target anxiety and depression.   # obstructive sleep apnea # fatigue Reviewed sleep study on 03/2020. AHI 6.9. Recommended weight loss, CPAP, oral surgery or appliance. She is advised to follow-up on this to get appropriate treatment, which can also help for fatigue.    Plan I have reviewed and updated plans as below 1. Continue bupropion 150 mg daily - monitor weight gain, leg edema 2.Next appointment-6/21 at 1 :20  for 20 mins, video - TSH was reportedly checked in late 2021; wnl  Past trials of medication:sertraline,fluoxetine(pruritis),lexapro(weight gain),venlafaxine (leg edema),duloxetine (weakness),quetiapine, Xanax  The patient demonstrates the following risk factors for suicide:  Chronic risk factors for suicide include:psychiatric disorder ofdepression. Acute risk factorsfor suicide include:loss (financial, interpersonal, professional). Protective factorsfor this patient include:coping skills and hope for the future. Considering these factors, the overall suicide risk at this point appears to below. Patientisappropriate for outpatient follow  up.   Rebecca Clay, MD 08/19/2020, 9:50 AM

## 2020-08-17 ENCOUNTER — Encounter: Payer: Self-pay | Admitting: Family Medicine

## 2020-08-17 ENCOUNTER — Ambulatory Visit (INDEPENDENT_AMBULATORY_CARE_PROVIDER_SITE_OTHER): Payer: 59 | Admitting: Family Medicine

## 2020-08-17 ENCOUNTER — Other Ambulatory Visit: Payer: Self-pay

## 2020-08-17 DIAGNOSIS — J0120 Acute ethmoidal sinusitis, unspecified: Secondary | ICD-10-CM | POA: Insufficient documentation

## 2020-08-17 DIAGNOSIS — H6692 Otitis media, unspecified, left ear: Secondary | ICD-10-CM

## 2020-08-17 DIAGNOSIS — N760 Acute vaginitis: Secondary | ICD-10-CM

## 2020-08-17 DIAGNOSIS — J309 Allergic rhinitis, unspecified: Secondary | ICD-10-CM | POA: Diagnosis not present

## 2020-08-17 DIAGNOSIS — I10 Essential (primary) hypertension: Secondary | ICD-10-CM | POA: Diagnosis not present

## 2020-08-17 MED ORDER — METRONIDAZOLE 0.75 % VA GEL: 1.0000 | Freq: Two times a day (BID) | VAGINAL | 2 refills | Status: DC

## 2020-08-17 MED ORDER — BENZONATATE 100 MG PO CAPS: 100.0000 mg | ORAL_CAPSULE | Freq: Four times a day (QID) | ORAL | 0 refills | Status: DC | PRN

## 2020-08-17 MED ORDER — PENICILLIN V POTASSIUM 500 MG PO TABS: 500.0000 mg | ORAL_TABLET | Freq: Three times a day (TID) | ORAL | 0 refills | Status: DC

## 2020-08-17 MED ORDER — PREDNISONE 5 MG PO TABS: 5.0000 mg | ORAL_TABLET | Freq: Two times a day (BID) | ORAL | 0 refills | Status: AC

## 2020-08-17 MED ORDER — FLUCONAZOLE 150 MG PO TABS: ORAL_TABLET | ORAL | 0 refills | Status: DC

## 2020-08-17 MED ORDER — MONTELUKAST SODIUM 10 MG PO TABS: 10.0000 mg | ORAL_TABLET | Freq: Every day | ORAL | 3 refills | Status: DC

## 2020-08-17 NOTE — Assessment & Plan Note (Signed)
Pen v anfdd fluconazole prescribed

## 2020-08-17 NOTE — Assessment & Plan Note (Signed)
metrogel prescribed

## 2020-08-17 NOTE — Telephone Encounter (Signed)
Work note up front for pt, pt informed.

## 2020-08-17 NOTE — Progress Notes (Signed)
   Rebecca Boyd     MRN: [id number]      DOB: [birth year]   HPI [name] is here for follow upewsas in the eD last week Tuesday, then video visit last Wednesday, taken out of work till last Saturday Still has wet cough, sometimes yellow sputum, left ear pressure and bilatreral ethmoid pressure, no fever or chills Has done Z pack ROS Denies chest pains, palpitations and leg swelling Denies abdominal pain, nausea, vomiting,diarrhea or constipation.   Denies dysuria, frequency, hesitancy or incontinence. Denies joint pain, swelling and limitation in mobility. Denies headaches, seizures, numbness, or tingling. Denies depression, anxiety or insomnia. Denies skin break down or rash.   PE  BP 135/89   Pulse 83   Resp 15   Ht 5' (1.524 m)   Wt 177 lb (80.3 kg)   LMP 07/30/2020   SpO2 97%   BMI 34.57 kg/m   Patient alert and oriented and in no cardiopulmonary distress.  HEENT: No facial asymmetry, EOMI,     Neck supple .anterior cervical adenitis, ethmoid sinus tenderness, left tM r erythematous  Chest: Clear to auscultation bilaterally.  CVS: S1, S2 no murmurs, no S3.Regular rate.  ABD: Soft non tender.   Ext: No edema  MS: Adequate ROM spine, shoulders, hips and knees.  Skin: Intact, no ulcerations or rash noted.  Psych: Good eye contact, normal affect. Memory intact not anxious or depressed appearing.  CNS: CN 2-12 intact, power,  normal throughout.no focal deficits noted.   Assessment & Plan   Acute non-recurrent ethmoidal sinusitis Pen V prescribed  Essential hypertension Controlled, no change in medication DASH diet and commitment to daily physical activity for a minimum of 30 minutes discussed and encouraged, as a part of hypertension management. The importance of attaining a healthy weight is also discussed.  BP/Weight 08/17/2020 08/11/2020 07/26/2020 06/17/2020 06/14/2020 06/08/2020 54/62/7035  Systolic BP 009 381 [phone] 938  Diastolic BP 89 98 88  91 95 93 85  Wt. (Lbs) 177 - 178 178 180 181 177  BMI 34.57 - 34.76 34.76 35.15 35.35 34.57  Some encounter information is confidential and restricted. Go to Review Flowsheets activity to see all data.       Allergic rhinitis uncontrolled start daily singulair  Left otitis media Pen v anfdd fluconazole prescribed  Vulvovaginitis metrogel prescribed

## 2020-08-17 NOTE — Assessment & Plan Note (Signed)
Pen V prescribed

## 2020-08-17 NOTE — Assessment & Plan Note (Signed)
uncontrolled start daily singulair

## 2020-08-17 NOTE — Patient Instructions (Signed)
Annual exam with pap in 2 to 3 months, call if you need me before  You are treated for uncontrolled allergies, and left ear infection and sinus infection  Return to work today as scheduled with no restrictions  It is important that you exercise regularly at least 30 minutes 5 times a week. If you develop chest pain, have severe difficulty breathing, or feel very tired, stop exercising immediately and seek medical attention  Think about what you will eat, plan ahead. Choose " clean, green, fresh or frozen" over canned, processed or packaged foods which are more sugary, salty and fatty. 70 to 75% of food eaten should be vegetables and fruit. Three meals at set times with snacks allowed between meals, but they must be fruit or vegetables. Aim to eat over a 12 hour period , example 7 am to 7 pm, and STOP after  your last meal of the day. Drink water,generally about 64 ounces per day, no other drink is as healthy. Fruit juice is best enjoyed in a healthy way, by EATING the fruit.  Thanks for choosing [facility], we consider it a privelige to serve you.

## 2020-08-17 NOTE — Assessment & Plan Note (Signed)
Controlled, no change in medication DASH diet and commitment to daily physical activity for a minimum of 30 minutes discussed and encouraged, as a part of hypertension management. The importance of attaining a healthy weight is also discussed.  BP/Weight 08/17/2020 08/11/2020 07/26/2020 06/17/2020 06/14/2020 06/08/2020 91/44/4584  Systolic BP 835 075 732 256 720 919 802  Diastolic BP 89 98 88 91 95 93 85  Wt. (Lbs) 177 - 178 178 180 181 177  BMI 34.57 - 34.76 34.76 35.15 35.35 34.57  Some encounter information is confidential and restricted. Go to Review Flowsheets activity to see all data.

## 2020-08-18 ENCOUNTER — Encounter: Payer: Self-pay | Admitting: Orthopedic Surgery

## 2020-08-18 ENCOUNTER — Ambulatory Visit (INDEPENDENT_AMBULATORY_CARE_PROVIDER_SITE_OTHER): Payer: 59 | Admitting: Orthopedic Surgery

## 2020-08-18 VITALS — BP 140/95 | HR 86 | Ht 60.0 in | Wt 175.0 lb

## 2020-08-18 DIAGNOSIS — G5602 Carpal tunnel syndrome, left upper limb: Secondary | ICD-10-CM | POA: Diagnosis not present

## 2020-08-18 NOTE — Progress Notes (Signed)
Chief Complaint  Patient presents with  . Wrist Pain    Lt wrist pain, here for NCS results.   45 year old female had nerve conduction study with Dr.Newton her results are listed below she has had nonoperative treatment  We discussed further treatment with surgical intervention which she is agreeable to after she speaks with her job  She should expect 4 weeks out of work  Past Medical History:  Diagnosis Date  . Anemia   . Anxiety   . Bacterial vaginosis 07/27/2014  . Cervical neck pain with evidence of disc disease 10/02/2013  . Depressive disorder, not elsewhere classified   . Family history of breast cancer   . Family history of breast cancer in sister 09/25/2018  . Family history of colon cancer   . Family history of lung cancer   . FH: breast cancer in relative when <34 years old 06/10/2018   Dx at age 64  . Headache 03/16/2016  . 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 Hypertension   . Migraine   . Migraines   . Miscarriage 2009  . Neck pain   . Nondisplaced fracture of fifth metatarsal bone, left foot, subsequent encounter for fracture with routine healing 08/15/18 09/25/2018  . Pruritus 09/29/2018  . Pruritus ani 09/25/2018  . Rash and nonspecific skin eruption 05/14/2017  . Vaginitis    cyctitis   Past Surgical History:  Procedure Laterality Date  . cervical cryotherapy N/A 1999  . DILITATION & CURRETTAGE/HYSTROSCOPY WITH THERMACHOICE ABLATION  08/26/2013   Procedure: DILATATION & CURETTAGE/HYSTEROSCOPY WITH THERMACHOICE ENDOMETRIAL ABLATION Procedure #2 Total Therapy Time=min       sec;  Surgeon: Jonnie Kind, MD;  Location: [facility];  Service: Gynecology;;  . LAPAROSCOPIC BILATERAL SALPINGECTOMY Bilateral 08/26/2013   Procedure: LAPAROSCOPIC BILATERAL SALPINGECTOMY AND REMOVAL OF LEFT PERITUBAL CYST Procedure #1;  Surgeon: Jonnie Kind, MD;  Location: [facility];  Service: Gynecology;  Laterality: Bilateral;  . LAPAROSCOPIC LYSIS OF ADHESIONS  08/26/2013    Procedure: LAPAROSCOPIC LYSIS OF ADHESIONS Procedure #1;  Surgeon: Jonnie Kind, MD;  Location: [facility];  Service: Gynecology;;  . Carmelia Bake LESION REMOVAL  08/26/2013   Procedure: REMOVAL OF VULVAR SEBACEOUS CYST Procedure #3;  Surgeon: Jonnie Kind, MD;  Location: [facility];  Service: Gynecology;;    Family History  Problem Relation Age of Onset  . Drug abuse Mother   . Hypertension Father   . Breast cancer Sister        dx 73  . Hypertension Maternal Grandmother   . Hypertension Maternal Grandfather   . Hypertension Paternal Grandmother   . Breast cancer Paternal Grandmother        dx late 53s  . Colon cancer Cousin 86    Social History   Tobacco Use  . Smoking status: Former Smoker    Packs/day: 0.00    Years: 1.00    Pack years: 0.00    Types: Cigars    Quit date: 01/05/2017    Years since quitting: 3.6  . Smokeless tobacco: Never Used  Vaping Use  . Vaping Use: Never used  Substance Use Topics  . Alcohol use: Yes    Comment: occasional  . Drug use: No      Impression: The above electrodiagnostic study is ABNORMAL and reveals evidence of a mild to moderate left median nerve entrapment at the wrist (carpal tunnel syndrome) affecting sensory components.   There is no significant electrodiagnostic evidence of any other  focal nerve entrapment, brachial plexopathy or cervical radiculopathy.   Recommendations: 1.  Follow-up with referring physician. 2.  Continue current management of symptoms. 3.  Continue use of resting splint at night-time and as needed during the day.  ___________________________ [place] Certified, American Board of Physical Medicine and Rehabilitation

## 2020-08-18 NOTE — Patient Instructions (Signed)
Endoscopic Carpal Tunnel Release Endoscopic carpal tunnel release is a surgery to relieve hand pain, numbness, and other symptoms caused by carpal tunnel syndrome. The carpal tunnel is a narrow, rigid space in the wrist. It is located between the wrist bones and a strong band of tissue (transverse carpal ligament). The nerve that provides feeling to most of the hand (median nerve) passes through the carpal tunnel. In carpal tunnel syndrome, swelling within the tunnel causes the median nerve to be pinched, causing symptoms. You may need endoscopic carpal tunnel release if you have symptoms that have not improved with other treatments. In this procedure, the transverse carpal ligament is cut to make more room in the carpal tunnel and reduce pressure on the median nerve. The endoscopic procedure is a minimally invasive procedure that is done through one or two small incisions. A thin scope with a camera (endoscope) is inserted through an incision. Images from the camera are sent to a monitor in the operating room to help the surgeon see inside your body. This type of surgery does not require a long incision that extends into your palm (open surgery). It also has a shorter recovery time. Tell your health care provider about:  Any allergies you have.  All medicines you are taking, including vitamins, herbs, eye drops, creams, and over-the-counter medicines.  Any problems you or family members have had with anesthetic medicines.  Any blood disorders you have.  Any surgeries you have had.  Any medical conditions you have.  Whether you are pregnant or may be pregnant. What are the risks? Generally, this is a safe procedure. However, problems may occur, including:  Infection.  Bleeding.  Allergic reactions to medicines.  Damage to the nerve or a blood vessel.  The need to change to an open procedure.  The procedure not being successful. What happens before the surgery? Staying  hydrated Follow instructions from your health care provider about hydration, which may include:  Up to 2 hours before the procedure - you may continue to drink clear liquids, such as water, clear fruit juice, black coffee, and plain tea.   Eating and drinking restrictions Follow instructions from your health care provider about eating and drinking, which may include:  8 hours before the procedure - stop eating heavy meals or foods, such as meat, fried foods, or fatty foods.  6 hours before the procedure - stop eating light meals or foods, such as toast or cereal.  6 hours before the procedure - stop drinking milk or drinks that contain milk.  2 hours before the procedure - stop drinking clear liquids. Medicines Ask your health care provider about:  Changing or stopping your regular medicines. This is especially important if you are taking diabetes medicines or blood thinners.  Taking medicines such as aspirin and ibuprofen. These medicines can thin your blood. Do not take these medicines unless your health care provider tells you to take them.  Taking over-the-counter medicines, vitamins, herbs, and supplements. General instructions  Do not use any products that contain nicotine or tobacco for at least 4 weeks before the procedure. These products include cigarettes, e-cigarettes, and chewing tobacco. If you need help quitting, ask your health care provider.  Plan to have a responsible adult take you home from the hospital or clinic.  If you will be going home right after the procedure, plan to have a responsible adult care for you for the time you are told. This is important.  Ask your health care provider: ?  How your surgery site will be marked. ? What steps will be taken to help prevent infection. These may include washing skin with a germ-killing soap. What happens during the surgery?  An IV will be inserted into one of your veins.  You will be given one or more of the  following: ? A medicine to help you relax (sedative). ? A medicine to numb the area (local anesthetic). ? A medicine that is injected into an area of your body to numb everything below the injection site (regional anesthetic).  A small incision will be made in the crease of your wrist, near the bottom of your palm.  Another incision may be made in the palm of your hand.  Your surgeon may insert a long, thin instrument (dilator) from one incision into the carpal tunnel to make room for the endoscope or the cutting tool.  The endoscope will be placed through one of the incisions. This enables your surgeon to see the carpal tunnel, the median nerve, and the transverse carpal ligament.  An endoscopic knife will be inserted through one of the incisions.  While viewing the ligament through the endoscope, your surgeon will cut the ligament with an upward motion.  The endoscope and knife will be removed, and the incision or incisions will be closed with stitches (sutures).  A compression bandage (dressing) will be wrapped around your hand and wrist. The procedure may vary among health care providers and hospitals.   What happens after the surgery?  Your blood pressure, heart rate, breathing rate, and blood oxygen level will be monitored until you leave the hospital or clinic.  You will be given pain medicine as needed.  A splint or brace may be placed over your dressing. This will hold your hand and wrist in place while you heal.  Do not drive until your health care provider approves. Summary  Endoscopic carpal tunnel release is a surgery to relieve symptoms of carpal tunnel syndrome.  This procedure is usually done after injecting a medicine (anesthetic) to numb the surgery area.  During the procedure, your surgeon will cut the transverse carpal ligament to reduce pressure on the median nerve.  After surgery, your hand and wrist will be wrapped in a compression bandage (dressing). You  may have a splint or brace. This information is not intended to replace advice given to you by your health care provider. Make sure you discuss any questions you have with your health care provider. Document Revised: 08/21/2019 Document Reviewed: 08/21/2019 Elsevier Patient Education  2021 [employer].

## 2020-08-19 ENCOUNTER — Telehealth (INDEPENDENT_AMBULATORY_CARE_PROVIDER_SITE_OTHER): Payer: 59 | Admitting: Psychiatry

## 2020-08-19 ENCOUNTER — Other Ambulatory Visit: Payer: Self-pay

## 2020-08-19 ENCOUNTER — Encounter: Payer: Self-pay | Admitting: Psychiatry

## 2020-08-19 DIAGNOSIS — F330 Major depressive disorder, recurrent, mild: Secondary | ICD-10-CM

## 2020-08-19 DIAGNOSIS — G4733 Obstructive sleep apnea (adult) (pediatric): Secondary | ICD-10-CM

## 2020-08-19 DIAGNOSIS — R5383 Other fatigue: Secondary | ICD-10-CM | POA: Diagnosis not present

## 2020-08-19 DIAGNOSIS — F411 Generalized anxiety disorder: Secondary | ICD-10-CM

## 2020-08-19 MED ORDER — BUPROPION HCL ER (XL) 150 MG PO TB24: 150.0000 mg | ORAL_TABLET | Freq: Every day | ORAL | 1 refills | Status: DC

## 2020-09-08 ENCOUNTER — Emergency Department (HOSPITAL_COMMUNITY)
Admission: EM | Admit: 2020-09-08 | Discharge: 2020-09-08 | Disposition: A | Discharge status: Home / Self Care | Payer: BC Managed Care – PPO | Attending: Emergency Medicine | Admitting: Emergency Medicine

## 2020-09-08 ENCOUNTER — Other Ambulatory Visit: Payer: Self-pay

## 2020-09-08 ENCOUNTER — Emergency Department (HOSPITAL_COMMUNITY): Payer: BC Managed Care – PPO

## 2020-09-08 ENCOUNTER — Encounter (HOSPITAL_COMMUNITY): Payer: Self-pay | Admitting: *Deleted

## 2020-09-08 DIAGNOSIS — I10 Essential (primary) hypertension: Secondary | ICD-10-CM | POA: Diagnosis not present

## 2020-09-08 DIAGNOSIS — J45909 Unspecified asthma, uncomplicated: Secondary | ICD-10-CM | POA: Insufficient documentation

## 2020-09-08 DIAGNOSIS — R519 Headache, unspecified: Secondary | ICD-10-CM | POA: Insufficient documentation

## 2020-09-08 DIAGNOSIS — H53149 Visual discomfort, unspecified: Secondary | ICD-10-CM | POA: Diagnosis not present

## 2020-09-08 DIAGNOSIS — Z79899 Other long term (current) drug therapy: Secondary | ICD-10-CM | POA: Diagnosis not present

## 2020-09-08 DIAGNOSIS — Z91040 Latex allergy status: Secondary | ICD-10-CM | POA: Diagnosis not present

## 2020-09-08 DIAGNOSIS — Z87891 Personal history of nicotine dependence: Secondary | ICD-10-CM | POA: Insufficient documentation

## 2020-09-08 DIAGNOSIS — Z8669 Personal history of other diseases of the nervous system and sense organs: Secondary | ICD-10-CM | POA: Diagnosis not present

## 2020-09-08 DIAGNOSIS — R202 Paresthesia of skin: Secondary | ICD-10-CM | POA: Insufficient documentation

## 2020-09-08 DIAGNOSIS — Z7951 Long term (current) use of inhaled steroids: Secondary | ICD-10-CM | POA: Insufficient documentation

## 2020-09-08 DIAGNOSIS — R200 Anesthesia of skin: Secondary | ICD-10-CM

## 2020-09-08 LAB — CBC WITH DIFFERENTIAL/PLATELET
Abs Immature Granulocytes: 0.03 10*3/uL (ref 0.00–0.07)
Basophils Absolute: 0 10*3/uL (ref 0.0–0.1)
Basophils Relative: 1 %
Eosinophils Absolute: 0.2 10*3/uL (ref 0.0–0.5)
Eosinophils Relative: 3 %
HCT: 40.7 % (ref 36.0–46.0)
Hemoglobin: 13.7 g/dL (ref 12.0–15.0)
Immature Granulocytes: 1 %
Lymphocytes Relative: 26 %
Lymphs Abs: 1.4 10*3/uL (ref 0.7–4.0)
MCH: 29.8 pg (ref 26.0–34.0)
MCHC: 33.7 g/dL (ref 30.0–36.0)
MCV: 88.5 fL (ref 80.0–100.0)
Monocytes Absolute: 0.3 10*3/uL (ref 0.1–1.0)
Monocytes Relative: 6 %
Neutro Abs: 3.3 10*3/uL (ref 1.7–7.7)
Neutrophils Relative %: 63 %
Platelets: 221 10*3/uL (ref 150–400)
RBC: 4.6 MIL/uL (ref 3.87–5.11)
RDW: 13.8 % (ref 11.5–15.5)
WBC: 5.2 10*3/uL (ref 4.0–10.5)
nRBC: 0 % (ref 0.0–0.2)

## 2020-09-08 LAB — COMPREHENSIVE METABOLIC PANEL
ALT: 19 U/L (ref 0–44)
AST: 17 U/L (ref 15–41)
Albumin: 4 g/dL (ref 3.5–5.0)
Alkaline Phosphatase: 88 U/L (ref 38–126)
Anion gap: 6 (ref 5–15)
BUN: 7 mg/dL (ref 6–20)
CO2: 26 mmol/L (ref 22–32)
Calcium: 9.1 mg/dL (ref 8.9–10.3)
Chloride: 105 mmol/L (ref 98–111)
Creatinine, Ser: 0.67 mg/dL (ref 0.44–1.00)
GFR, Estimated: >60 mL/min (ref >60)
Glucose, Bld: 97 mg/dL (ref 70–99)
Potassium: 3.3 mmol/L — ABNORMAL LOW (ref 3.5–5.1)
Sodium: 137 mmol/L (ref 135–145)
Total Bilirubin: 0.8 mg/dL (ref 0.3–1.2)
Total Protein: 7.4 g/dL (ref 6.5–8.1)

## 2020-09-08 LAB — LACTIC ACID, PLASMA: Lactic Acid, Venous: 0.9 mmol/L (ref 0.5–1.9)

## 2020-09-08 MED ORDER — SODIUM CHLORIDE 0.9 % IV BOLUS
500.0000 mL | Freq: Once | INTRAVENOUS | Status: AC
  Administered 2020-09-08: 500 mL via INTRAVENOUS

## 2020-09-08 MED ORDER — METOCLOPRAMIDE HCL 5 MG/ML IJ SOLN
10.0000 mg | Freq: Once | INTRAMUSCULAR | Status: AC
  Administered 2020-09-08: 10 mg via INTRAVENOUS
  Filled 2020-09-08: qty 2

## 2020-09-08 MED ORDER — SODIUM CHLORIDE 0.9 % IV SOLN
12.5000 mg | Freq: Four times a day (QID) | INTRAVENOUS | Status: DC | PRN
  Administered 2020-09-08: 12.5 mg via INTRAVENOUS
  Filled 2020-09-08: qty 0.5

## 2020-09-08 NOTE — ED Triage Notes (Signed)
Pt c/o tingling all over her body that started last night at 2200. Face symmetrical, no arm or left drift, no difficulty speaking. Pt does feel "more tingling" on left side of face and arm, but also on right leg, when sensation is checked. Pt started having a headache 1 hour ago with light sensitivity. Pt also reports right flank pain that wraps around to her abdomen with nausea that just started while waiting for triage. Denies diarrhea, difficulty urinating.

## 2020-09-08 NOTE — ED Provider Notes (Signed)
[facility] EMERGENCY DEPARTMENT Provider Note   CSN: [id number] Arrival date & time: 09/08/20  0272     History Chief Complaint  Patient presents with  . Numbness    Rebecca Boyd is a 45 y.o. female.  HPI   Patient with with significant medical history of cervical neck pain, migraines, rash presents to the emergency department with chief complaint of headaches and total body tingling.  Patient states this started last night, came on suddenly, she has associated intermittent blurred vision, photophobia, increased sensitivity to noise, she has nausea without vomiting, as well as total body tingling.  She states that her whole body is tingling, she denies weakness, recent head trauma, is not on anticoagulant.  She has no associated fevers or chills, denies IV drug use, states that she has a history of migraines, but states this is different as she generally does not have full body tingling.  She also states that her stomach started to hurt 30 minutes after arriving to the hospital, no significant abdominal history, no constipation, diarrhea, urinary symptoms.  She denies any alleviating factors.  Patient denies fevers, chills, chest pain, shortness of breath, urinary symptoms, worsening pedal edema.  Past Medical History:  Diagnosis Date  . Anemia   . Anxiety   . Bacterial vaginosis 07/27/2014  . Cervical neck pain with evidence of disc disease 10/02/2013  . Depressive disorder, not elsewhere classified   . Family history of breast cancer   . Family history of breast cancer in sister 09/25/2018  . Family history of colon cancer   . Family history of lung cancer   . FH: breast cancer in relative when <60 years old 06/10/2018   Dx at age 36  . Headache 03/16/2016  . 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 Hypertension   . Migraine   . Migraines   . Miscarriage 2009  . Neck pain   . Nondisplaced fracture of fifth metatarsal bone, left foot, subsequent encounter  for fracture with routine healing 08/15/18 09/25/2018  . Pruritus 09/29/2018  . Pruritus ani 09/25/2018  . Rash and nonspecific skin eruption 05/14/2017  . Vaginitis    cyctitis    Patient Active Problem List   Diagnosis Date Noted  . Left otitis media 08/17/2020  . Acute non-recurrent ethmoidal sinusitis 08/17/2020  . Numbness and tingling of left hand 06/08/2020  . Fatigue 04/15/2020  . 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 Asthma 10/29/2019  . Hypokalemia 07/11/2019  . Epistaxis 07/09/2019  . Family history of lung cancer   . Breast pain, left 02/05/2019  . 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 Rash and nonspecific skin eruption 05/14/2017  . Chronic migraine 12/06/2016  . Vulvovaginitis 08/10/2016  . Genetic testing 05/10/2016  . Obesity (BMI 30.0-34.9) 03/17/2015  . Essential hypertension 01/07/2014  . Pain in thoracic spine 08/07/2013  . Low back pain with radiation 07/30/2013  . Vitamin D deficiency 10/27/2010  . Anxiety 10/26/2010  . Allergic rhinitis 12/01/2008  . Neck pain 07/04/2007    Past Surgical History:  Procedure Laterality Date  . cervical cryotherapy N/A 1999  . DILITATION & CURRETTAGE/HYSTROSCOPY WITH THERMACHOICE ABLATION  08/26/2013   Procedure: DILATATION & CURETTAGE/HYSTEROSCOPY WITH THERMACHOICE ENDOMETRIAL ABLATION Procedure #2 Total Therapy Time=min       sec;  Surgeon: Jonnie Kind, MD;  Location: [facility];  Service: Gynecology;;  . LAPAROSCOPIC BILATERAL SALPINGECTOMY Bilateral 08/26/2013   Procedure: LAPAROSCOPIC BILATERAL SALPINGECTOMY AND REMOVAL  OF LEFT PERITUBAL CYST Procedure #1;  Surgeon: Tilda Burrow, MD;  Location: [facility];  Service: Gynecology;  Laterality: Bilateral;  . LAPAROSCOPIC LYSIS OF ADHESIONS  08/26/2013   Procedure: LAPAROSCOPIC LYSIS OF ADHESIONS Procedure #1;  Surgeon: Tilda Burrow, MD;  Location: [facility];  Service: Gynecology;;  . Horald Pollen LESION REMOVAL  08/26/2013   Procedure: REMOVAL OF VULVAR  SEBACEOUS CYST Procedure #3;  Surgeon: Tilda Burrow, MD;  Location: [facility];  Service: Gynecology;;     OB History   No obstetric history on file.     Family History  Problem Relation Age of Onset  . Drug abuse Mother   . Hypertension Father   . Breast cancer Sister        dx 22  . Hypertension Maternal Grandmother   . Hypertension Maternal Grandfather   . Hypertension Paternal Grandmother   . Breast cancer Paternal Grandmother        dx late 60s  . Colon cancer Cousin 13    Social History   Tobacco Use  . Smoking status: Former Smoker    Packs/day: 0.00    Years: 1.00    Pack years: 0.00    Types: Cigars    Quit date: 01/05/2017    Years since quitting: 3.6  . Smokeless tobacco: Never Used  Vaping Use  . Vaping Use: Never used  Substance Use Topics  . Alcohol use: Yes    Comment: occasional  . Drug use: No    Home Medications Prior to Admission medications   Medication Sig Start Date End Date Taking? Authorizing Provider  ALBUTEROL SULFATE HFA IN Inhale 2 puffs into the lungs.   Yes [provider]  amLODipine (NORVASC) 10 MG tablet Take 1 tablet (10 mg total) by mouth daily. 02/11/20  Yes Kerri Perches, MD  buPROPion (WELLBUTRIN XL) 150 MG 24 hr tablet Take 1 tablet (150 mg total) by mouth daily. 08/20/20 10/19/20 Yes Hisada, Barbee Cough, MD  Erenumab-aooe (AIMOVIG) 70 MG/ML SOAJ Inject 70 mg into the skin every 30 (thirty) days. 08/12/19  Yes Glean Salvo, NP  ergocalciferol (VITAMIN D2) 1.25 MG (50000 UT) capsule Take 1 capsule (50,000 Units total) by mouth once a week. One capsule once weekly 03/22/20  Yes Kerri Perches, MD  gabapentin (NEURONTIN) 300 MG capsule Take 1 capsule (300 mg total) by mouth at bedtime. 06/17/20  Yes Vickki Hearing, MD  montelukast (SINGULAIR) 10 MG tablet Take 1 tablet (10 mg total) by mouth at bedtime. 08/17/20  Yes Kerri Perches, MD  oxyCODONE-acetaminophen (PERCOCET/ROXICET) 5-325 MG tablet Take 1-2 tablets  by mouth 2 (two) times daily as needed.  03/10/20  Yes [provider]  SUMAtriptan (IMITREX) 6 MG/0.5ML SOLN injection Use one injection at onset of migraine.  May repeat in 2 hrs, if needed.  Max dose: 2 inj/day. This is a 30 day prescription. 08/12/19  Yes Glean Salvo, NP  topiramate (TOPAMAX) 100 MG tablet Take 1 tablet (100 mg total) by mouth 2 (two) times daily. 08/12/19  Yes Glean Salvo, NP  azithromycin (ZITHROMAX) 250 MG tablet Take as package instructions. Patient not taking: No sig reported 08/12/20   Anabel Halon, MD  benzonatate (TESSALON PERLES) 100 MG capsule Take 1 capsule (100 mg total) by mouth every 6 (six) hours as needed for cough. Patient not taking: Reported on 09/08/2020 08/17/20   Kerri Perches, MD  betamethasone dipropionate 0.05 % cream Apply topically 2 (two) times daily.  Patient not taking: Reported on 09/08/2020 06/14/20   Kerri PerchesSimpson, Margaret E, MD  colchicine 0.6 MG tablet Take 1 tablet (0.6 mg total) by mouth See admin instructions. Take 2 tablets PO now, then take 1 tablet PO in 1 hour Patient not taking: Reported on 09/08/2020 04/26/20   Heather RobertsGray, Joseph M, NP  fluconazole (DIFLUCAN) 150 MG tablet Take one tablet once daily as needed for vaginal  Itch associated with antibiotic use Patient not taking: Reported on 09/08/2020 08/17/20   Kerri PerchesSimpson, Margaret E, MD  metroNIDAZOLE (METROGEL VAGINAL) 0.75 % vaginal gel Place 1 Applicatorful vaginally 2 (two) times daily. Patient not taking: Reported on 09/08/2020 08/17/20   Kerri PerchesSimpson, Margaret E, MD  penicillin v potassium (VEETID) 500 MG tablet Take 1 tablet (500 mg total) by mouth 3 (three) times daily. Patient not taking: Reported on 09/08/2020 08/17/20   Kerri PerchesSimpson, Margaret E, MD  triamterene-hydrochlorothiazide (MAXZIDE) 75-50 MG tablet Take 1 tablet by mouth daily. Patient not taking: Reported on 09/08/2020 02/11/20   Kerri PerchesSimpson, Margaret E, MD  fluticasone [facility](FLONASE) 50 MCG/ACT nasal spray Place 2 sprays into both  nostrils daily. 04/10/19 07/08/19  Freddy FinnerMills, Hannah M, NP    Allergies    Diphenhydramine hcl, Orange fruit, and Latex  Review of Systems   Review of Systems  Constitutional: Negative for chills and fever.  HENT: Negative for congestion and sore throat.   Respiratory: Negative for shortness of breath.   Cardiovascular: Negative for chest pain.  Gastrointestinal: Positive for abdominal pain and nausea. Negative for diarrhea and vomiting.  Genitourinary: Negative for enuresis.  Musculoskeletal: Negative for back pain.  Skin: Negative for rash.  Neurological: Positive for headaches. Negative for dizziness and weakness.  Hematological: Does not bruise/bleed easily.    Physical Exam Updated Vital Signs BP 124/77 (BP Location: Left Arm)   Pulse 89   Temp 98.8 F (37.1 C) (Oral)   Resp 16   Ht 5' (1.524 m)   Wt 79.4 kg   LMP 07/30/2020   SpO2 98%   BMI 34.18 kg/m   Physical Exam Vitals and nursing note reviewed.  Constitutional:      General: She is not in acute distress.    Appearance: She is not ill-appearing.  HENT:     Head: Normocephalic and atraumatic.     Nose: No congestion.  Eyes:     Extraocular Movements: Extraocular movements intact.     Conjunctiva/sclera: Conjunctivae normal.     Pupils: Pupils are equal, round, and reactive to light.  Cardiovascular:     Rate and Rhythm: Normal rate and regular rhythm.     Pulses: Normal pulses.     Heart sounds: No murmur heard. No friction rub. No gallop.   Pulmonary:     Effort: No respiratory distress.     Breath sounds: No wheezing, rhonchi or rales.  Abdominal:     Palpations: Abdomen is soft.     Tenderness: There is no abdominal tenderness.  Musculoskeletal:     Right lower leg: No edema.     Left lower leg: No edema.     Comments: Patient has 5 out of 5 strength, neurovascularly intact in all 4 extremities  Skin:    General: Skin is warm and dry.  Neurological:     Mental Status: She is alert.     GCS: GCS  eye subscore is 4. GCS verbal subscore is 5. GCS motor subscore is 6.     Motor: No weakness.     Coordination: Romberg sign  negative. Finger-Nose-Finger Test normal.     Comments: Current nerves II through XII are grossly intact  Patient is having no difficulty with word finding.  Patient endorses slight decrease in sensation in her right leg   Psychiatric:        Mood and Affect: Mood normal.     ED Results / Procedures / Treatments   Labs (all labs ordered are listed, but only abnormal results are displayed) Labs Reviewed  COMPREHENSIVE METABOLIC PANEL - Abnormal; Notable for the following components:      Result Value   Potassium 3.3 (*)    All other components within normal limits  CBC WITH DIFFERENTIAL/PLATELET  LACTIC ACID, PLASMA    EKG None  Radiology CT Head Wo Contrast  Result Date: 09/08/2020 CLINICAL DATA:  Headache with tingling sensations, primarily on the left side EXAM: CT HEAD WITHOUT CONTRAST TECHNIQUE: Contiguous axial images were obtained from the base of the skull through the vertex without intravenous contrast. COMPARISON:  Head CT February 05, 2006; brain MRI January 11, 2017 FINDINGS: Brain: Ventricles and sulci are normal in size and configuration. There is no intracranial mass, hemorrhage, extra-axial fluid collection, or midline shift. The brain parenchyma appears unremarkable. There is no evident acute infarct. Vascular: No hyperdense vessel. No appreciable vascular calcification. Skull: Bony calvarium appears intact. Sinuses/Orbits: Visualized paranasal sinuses are clear. Visualized orbits appear symmetric bilaterally. Other: Mastoid air cells are clear. IMPRESSION: Study within normal limits. Electronically Signed   By: Lowella Grip III M.D.   On: 09/08/2020 10:51    Procedures Procedures   Medications Ordered in ED Medications  promethazine (PHENERGAN) 12.5 mg in sodium chloride 0.9 % 50 mL IVPB (0 mg Intravenous Stopped 09/08/20 1201)   sodium chloride 0.9 % bolus 500 mL (0 mLs Intravenous Stopped 09/08/20 1201)  metoCLOPramide (REGLAN) injection 10 mg (10 mg Intravenous Given 09/08/20 1042)    ED Course  I have reviewed the triage vital signs and the nursing notes.  Pertinent labs & imaging results that were available during my care of the patient were reviewed by me and considered in my medical decision making (see chart for details).    MDM Rules/Calculators/A&P                         Initial impression-patient presents with headaches and full body tingling.  She is alert, does not appear in acute distress, vital signs reassuring.  Will obtain basic lab work-up, CT head, 5 patient with migraine cocktail and reassess.  Work-up-CBC unremarkable, CMP shows slight hypokalemia 3.3, CT head negative for acute findings.  Reassessment patient reassessed after migraine cocktail, states she is feeling much better, has no complaints at this time.  Patient is agreeable for discharge.  Rule out-low suspicion for intracranial head bleed and or mass as there is no neurodeficits on my exam, CT head is negative for acute findings.  Patient also improved after migraine cocktail.  Low suspicion for spinal cord abnormality or spinal fracture as patient denies recent trauma to the area, she is moving all 4 extremities with out difficulty.  Low suspicion for systemic infection as patient is nontoxic-appearing, vital signs reassuring, no meningitis noted my exam.  Plan-  1.  Headaches and full body tingling since resolved-suspect secondary due to a complex migraine, will recommend she continue with her home medications, follow-up with neurology for further evaluation.  Vital signs have remained stable, no indication for hospital admission. Patient given at home care  as well strict return precautions.  Patient verbalized that they understood agreed to said plan.   Final Clinical Impression(s) / ED Diagnoses Final diagnoses:  Bad headache   Numbness    Rx / DC Orders ED Discharge Orders    None       Marcello Fennel, PA-C 09/08/20 1250    Milton Ferguson, MD 09/09/20 [phone]

## 2020-09-08 NOTE — ED Notes (Signed)
Pt c/o tingling "all over" and headache that started last night.  Denies any weankness or numbness on either side.  Face symmetrical,  Pupils equal and reactive, grip strength equal, leg strength equal.

## 2020-09-08 NOTE — Discharge Instructions (Signed)
Exam and lab work all look reassuring.  Please continue with your home medications as prescribed.  For migraines I recommend over-the-counter pain medication like ibuprofen and Tylenol, you can also try Excedrin.  Please stay hydrated this and also decrease migraine frequency.  Please follow-up with your neurology for further evaluation.  Come back to the emergency department if you develop chest pain, shortness of breath, severe abdominal pain, uncontrolled nausea, vomiting, diarrhea.

## 2020-09-12 ENCOUNTER — Encounter: Payer: Self-pay | Admitting: Family Medicine

## 2020-09-13 ENCOUNTER — Other Ambulatory Visit: Payer: Self-pay

## 2020-09-13 ENCOUNTER — Encounter: Payer: Self-pay | Admitting: Internal Medicine

## 2020-09-13 ENCOUNTER — Ambulatory Visit (INDEPENDENT_AMBULATORY_CARE_PROVIDER_SITE_OTHER): Payer: BC Managed Care – PPO | Admitting: Internal Medicine

## 2020-09-13 VITALS — BP 129/90 | HR 95 | Temp 98.7°F | Resp 18 | Ht 60.0 in | Wt 182.8 lb

## 2020-09-13 DIAGNOSIS — E876 Hypokalemia: Secondary | ICD-10-CM

## 2020-09-13 DIAGNOSIS — I10 Essential (primary) hypertension: Secondary | ICD-10-CM | POA: Diagnosis not present

## 2020-09-13 DIAGNOSIS — Z09 Encounter for follow-up examination after completed treatment for conditions other than malignant neoplasm: Secondary | ICD-10-CM | POA: Diagnosis not present

## 2020-09-13 NOTE — Telephone Encounter (Signed)
Pt added to schedule as work in 09-13-20 with patel

## 2020-09-13 NOTE — Patient Instructions (Addendum)
Please continue taking medications as prescribed.  Please make sure to take at least 64 ounces of fluid in a day. Avoid skipping any meal.

## 2020-09-13 NOTE — Progress Notes (Signed)
Established Patient Office Visit  Subjective:  Patient ID: Rebecca Boyd, female    DOB: 12/14/1975  Age: 45 y.o. MRN: [id number]  CC:  Chief Complaint  Patient presents with  . Hypertension    Pt went to urgent care 09-07-20 for nosebleed and high blood pressure she has been out of work since last Tuesday needed a return to work note and get bp checked so she can go back     HPI [name] presents for follow up after an ER visit for headache. She had been having headache and intermittent tingling associated with blurry vision before the visit and had CT head in the ER, which was unremarkable. She states that she has been feeling better now. Denies any dizziness, chest pain, dyspnea or palpitations.  Past Medical History:  Diagnosis Date  . Anemia   . Anxiety   . Bacterial vaginosis 07/27/2014  . Cervical neck pain with evidence of disc disease 10/02/2013  . Depressive disorder, not elsewhere classified   . Family history of breast cancer   . Family history of breast cancer in sister 09/25/2018  . Family history of colon cancer   . Family history of lung cancer   . FH: breast cancer in relative when <46 years old 06/10/2018   Dx at age 15  . Headache 03/16/2016  . 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 Hypertension   . Migraine   . Migraines   . Miscarriage 2009  . Neck pain   . Nondisplaced fracture of fifth metatarsal bone, left foot, subsequent encounter for fracture with routine healing 08/15/18 09/25/2018  . Pruritus 09/29/2018  . Pruritus ani 09/25/2018  . Rash and nonspecific skin eruption 05/14/2017  . Vaginitis    cyctitis    Past Surgical History:  Procedure Laterality Date  . cervical cryotherapy N/A 1999  . DILITATION & CURRETTAGE/HYSTROSCOPY WITH THERMACHOICE ABLATION  08/26/2013   Procedure: DILATATION & CURETTAGE/HYSTEROSCOPY WITH THERMACHOICE ENDOMETRIAL ABLATION Procedure #2 Total Therapy Time=min       sec;  Surgeon: Jonnie Kind, MD;  Location: [facility];  Service: Gynecology;;  . LAPAROSCOPIC BILATERAL SALPINGECTOMY Bilateral 08/26/2013   Procedure: LAPAROSCOPIC BILATERAL SALPINGECTOMY AND REMOVAL OF LEFT PERITUBAL CYST Procedure #1;  Surgeon: Jonnie Kind, MD;  Location: [facility];  Service: Gynecology;  Laterality: Bilateral;  . LAPAROSCOPIC LYSIS OF ADHESIONS  08/26/2013   Procedure: LAPAROSCOPIC LYSIS OF ADHESIONS Procedure #1;  Surgeon: Jonnie Kind, MD;  Location: [facility];  Service: Gynecology;;  . Carmelia Bake LESION REMOVAL  08/26/2013   Procedure: REMOVAL OF VULVAR SEBACEOUS CYST Procedure #3;  Surgeon: Jonnie Kind, MD;  Location: [facility];  Service: Gynecology;;    Family History  Problem Relation Age of Onset  . Drug abuse Mother   . Hypertension Father   . Breast cancer Sister        dx 5  . Hypertension Maternal Grandmother   . Hypertension Maternal Grandfather   . Hypertension Paternal Grandmother   . Breast cancer Paternal Grandmother        dx late 51s  . Colon cancer Cousin 22    Social History   Socioeconomic History  . Marital status: Single    Spouse name: Not on file  . Number of children: Not on file  . Years of education: Not on file  . Highest education level: Not on file  Occupational History  . Not on file  Tobacco Use  .  Smoking status: Former Smoker    Packs/day: 0.00    Years: 1.00    Pack years: 0.00    Types: Cigars    Quit date: 01/05/2017    Years since quitting: 3.6  . Smokeless tobacco: Never Used  Vaping Use  . Vaping Use: Never used  Substance and Sexual Activity  . Alcohol use: Yes    Comment: occasional  . Drug use: No  . Sexual activity: Yes    Birth control/protection: None  Other Topics Concern  . Not on file  Social History Narrative   Lives home with mother.  Works at [employer].  Education 10th grade/GED.  No children.  Single.     Social Determinants of Health   Financial Resource Strain: Not on file  Food Insecurity: Not on  file  Transportation Needs: Not on file  Physical Activity: Not on file  Stress: Not on file  Social Connections: Not on file  Intimate Partner Violence: Not on file    Outpatient Medications Prior to Visit  Medication Sig Dispense Refill  . ALBUTEROL SULFATE HFA IN Inhale 2 puffs into the lungs.    Marland Kitchen amLODipine (NORVASC) 10 MG tablet Take 1 tablet (10 mg total) by mouth daily. 90 tablet 3  . benzonatate (TESSALON PERLES) 100 MG capsule Take 1 capsule (100 mg total) by mouth every 6 (six) hours as needed for cough. 30 capsule 0  . betamethasone dipropionate 0.05 % cream Apply topically 2 (two) times daily. 45 g 0  . buPROPion (WELLBUTRIN XL) 150 MG 24 hr tablet Take 1 tablet (150 mg total) by mouth daily. 30 tablet 1  . colchicine 0.6 MG tablet Take 1 tablet (0.6 mg total) by mouth See admin instructions. Take 2 tablets PO now, then take 1 tablet PO in 1 hour 3 tablet 0  . Erenumab-aooe (AIMOVIG) 70 MG/ML SOAJ Inject 70 mg into the skin every 30 (thirty) days. 1 pen 11  . ergocalciferol (VITAMIN D2) 1.25 MG (50000 UT) capsule Take 1 capsule (50,000 Units total) by mouth once a week. One capsule once weekly 12 capsule 2  . fluconazole (DIFLUCAN) 150 MG tablet Take one tablet once daily as needed for vaginal  Itch associated with antibiotic use (Patient taking differently: Take one tablet once daily as needed for vaginal  Itch associated with antibiotic use) 2 tablet 0  . gabapentin (NEURONTIN) 300 MG capsule Take 1 capsule (300 mg total) by mouth at bedtime. 42 capsule 0  . metroNIDAZOLE (METROGEL VAGINAL) 0.75 % vaginal gel Place 1 Applicatorful vaginally 2 (two) times daily. 70 g 2  . montelukast (SINGULAIR) 10 MG tablet Take 1 tablet (10 mg total) by mouth at bedtime. 30 tablet 3  . oxyCODONE-acetaminophen (PERCOCET/ROXICET) 5-325 MG tablet Take 1-2 tablets by mouth 2 (two) times daily as needed.     . penicillin v potassium (VEETID) 500 MG tablet Take 1 tablet (500 mg total) by mouth 3  (three) times daily. 30 tablet 0  . SUMAtriptan (IMITREX) 6 MG/0.5ML SOLN injection Use one injection at onset of migraine.  May repeat in 2 hrs, if needed.  Max dose: 2 inj/day. This is a 30 day prescription. 5 mL 11  . topiramate (TOPAMAX) 100 MG tablet Take 1 tablet (100 mg total) by mouth 2 (two) times daily. 180 tablet 4  . triamterene-hydrochlorothiazide (MAXZIDE) 75-50 MG tablet Take 1 tablet by mouth daily. 30 tablet 4  . azithromycin (ZITHROMAX) 250 MG tablet Take as package instructions. (Patient not taking:  No sig reported) 6 tablet 0   No facility-administered medications prior to visit.    Allergies  Allergen Reactions  . Diphenhydramine Hcl Anaphylaxis    (Benadryl)  . Orange Fruit Hives, Shortness Of Breath, Itching and Other (See Comments)    MAKES IT DIFFICULT TO BREATH  . Latex Rash    ROS Review of Systems  Constitutional: Negative for chills and fever.  HENT: Negative for congestion, sinus pressure, sinus pain and sore throat.   Eyes: Negative for pain and discharge.  Respiratory: Negative for cough and shortness of breath.   Cardiovascular: Negative for chest pain and palpitations.  Gastrointestinal: Negative for abdominal pain, constipation, diarrhea, nausea and vomiting.  Endocrine: Negative for polydipsia and polyuria.  Genitourinary: Negative for dysuria and hematuria.  Musculoskeletal: Negative for neck pain and neck stiffness.  Skin: Negative for rash.  Neurological: Positive for headaches. Negative for dizziness and weakness.  Psychiatric/Behavioral: Negative for agitation and behavioral problems.      Objective:    Physical Exam Vitals reviewed.  Constitutional:      General: She is not in acute distress.    Appearance: She is not diaphoretic.  HENT:     Head: Normocephalic and atraumatic.     Nose: Nose normal. No congestion.     Mouth/Throat:     Mouth: Mucous membranes are moist.     Pharynx: No posterior oropharyngeal erythema.  Eyes:      General: No scleral icterus.    Extraocular Movements: Extraocular movements intact.  Cardiovascular:     Rate and Rhythm: Normal rate and regular rhythm.     Pulses: Normal pulses.     Heart sounds: Normal heart sounds. No murmur heard.   Pulmonary:     Breath sounds: Normal breath sounds. No wheezing or rales.  Musculoskeletal:     Cervical back: Neck supple. No tenderness.     Right lower leg: No edema.     Left lower leg: No edema.  Skin:    General: Skin is warm.     Findings: No rash.  Neurological:     General: No focal deficit present.     Mental Status: She is alert and oriented to person, place, and time.     Sensory: No sensory deficit.     Motor: No weakness.  Psychiatric:        Mood and Affect: Mood normal.        Behavior: Behavior normal.     BP 129/90 (BP Location: Right Arm, Patient Position: Sitting, Cuff Size: Normal)   Pulse 95   Temp 98.7 F (37.1 C) (Oral)   Resp 18   Ht 5' (1.524 m)   Wt 182 lb 12.8 oz (82.9 kg)   SpO2 98%   BMI 35.70 kg/m  Wt Readings from Last 3 Encounters:  09/13/20 182 lb 12.8 oz (82.9 kg)  09/08/20 175 lb (79.4 kg)  08/18/20 175 lb (79.4 kg)     There are no preventive care reminders to display for this patient.  There are no preventive care reminders to display for this patient.  Lab Results  Component Value Date   TSH 0.951 03/18/2020   Lab Results  Component Value Date   WBC 5.2 09/08/2020   HGB 13.7 09/08/2020   HCT 40.7 09/08/2020   MCV 88.5 09/08/2020   PLT 221 09/08/2020   Lab Results  Component Value Date   NA 137 09/08/2020   K 3.3 (L) 09/08/2020   CO2 26 09/08/2020  GLUCOSE 97 09/08/2020   BUN 7 09/08/2020   CREATININE 0.67 09/08/2020   BILITOT 0.8 09/08/2020   ALKPHOS 88 09/08/2020   AST 17 09/08/2020   ALT 19 09/08/2020   PROT 7.4 09/08/2020   ALBUMIN 4.0 09/08/2020   CALCIUM 9.1 09/08/2020   ANIONGAP 6 09/08/2020   Lab Results  Component Value Date   CHOL 192 03/18/2020    Lab Results  Component Value Date   HDL 45 03/18/2020   Lab Results  Component Value Date   LDLCALC 133 (H) 03/18/2020   Lab Results  Component Value Date   TRIG 76 03/18/2020   Lab Results  Component Value Date   CHOLHDL 4.3 03/18/2020   Lab Results  Component Value Date   HGBA1C 5.3 03/18/2020      Assessment & Plan:   Problem List Items Addressed This Visit      Cardiovascular and Mediastinum   Essential hypertension BP Readings from Last 1 Encounters:  09/13/20 129/90   Well-controlled Counseled for compliance with the medications Advised DASH diet and moderate exercise/walking, at least 150 mins/week      Other   Hypokalemia Noted from last BMP Advised to avoid skipping any meal Adequate hydration Potassium rich diet If persistent, will add K supplement    Other Visit Diagnoses    Encounter for examination following treatment at hospital    -  Primary ER chart reviewed Imaging and blood tests reviewed Medications reconciled     Patient is in stable medical condition to safely return to work. Return to work note provided.  No orders of the defined types were placed in this encounter.   Follow-up: Return if symptoms worsen or fail to improve.    Lindell Spar, MD

## 2020-09-24 DIAGNOSIS — M545 Low back pain, unspecified: Secondary | ICD-10-CM | POA: Diagnosis not present

## 2020-09-24 DIAGNOSIS — I10 Essential (primary) hypertension: Secondary | ICD-10-CM | POA: Diagnosis not present

## 2020-09-24 DIAGNOSIS — M542 Cervicalgia: Secondary | ICD-10-CM | POA: Diagnosis not present

## 2020-09-24 DIAGNOSIS — M546 Pain in thoracic spine: Secondary | ICD-10-CM | POA: Diagnosis not present

## 2020-09-28 ENCOUNTER — Encounter: Payer: Self-pay | Admitting: Family Medicine

## 2020-09-29 ENCOUNTER — Ambulatory Visit: Payer: BC Managed Care – PPO | Admitting: Family Medicine

## 2020-09-30 ENCOUNTER — Other Ambulatory Visit: Payer: Self-pay

## 2020-09-30 ENCOUNTER — Encounter: Payer: Self-pay | Admitting: Family Medicine

## 2020-09-30 ENCOUNTER — Other Ambulatory Visit (HOSPITAL_COMMUNITY): Payer: Self-pay | Admitting: Family Medicine

## 2020-09-30 ENCOUNTER — Ambulatory Visit (INDEPENDENT_AMBULATORY_CARE_PROVIDER_SITE_OTHER): Payer: BC Managed Care – PPO | Admitting: Family Medicine

## 2020-09-30 VITALS — BP 136/82 | HR 86 | Temp 97.6°F | Ht 60.0 in | Wt 183.0 lb

## 2020-09-30 DIAGNOSIS — Z1231 Encounter for screening mammogram for malignant neoplasm of breast: Secondary | ICD-10-CM

## 2020-09-30 DIAGNOSIS — F323 Major depressive disorder, single episode, severe with psychotic features: Secondary | ICD-10-CM

## 2020-09-30 DIAGNOSIS — I10 Essential (primary) hypertension: Secondary | ICD-10-CM

## 2020-09-30 DIAGNOSIS — M7989 Other specified soft tissue disorders: Secondary | ICD-10-CM | POA: Diagnosis not present

## 2020-09-30 DIAGNOSIS — E669 Obesity, unspecified: Secondary | ICD-10-CM

## 2020-09-30 DIAGNOSIS — N644 Mastodynia: Secondary | ICD-10-CM | POA: Insufficient documentation

## 2020-09-30 MED ORDER — IBUPROFEN 400 MG PO TABS: 400.0000 mg | ORAL_TABLET | Freq: Three times a day (TID) | ORAL | 0 refills | Status: DC

## 2020-09-30 MED ORDER — FUROSEMIDE 20 MG PO TABS: ORAL_TABLET | ORAL | 0 refills | Status: DC

## 2020-09-30 NOTE — Patient Instructions (Addendum)
F/u as before, call if you need me sooner  Take ibuprofen 400 mg one three tiomes daily for next 5 days, apply cool compress to breasts , and sTOP caffeine to reduce breast pain, will reassess at next visit.  Lasix prescribed for sparing use for leg swelling when needed  Please schedule mammogram at checkout  It is important that you exercise regularly at least 30 minutes 5 times a week. If you develop chest pain, have severe difficulty breathing, or feel very tired, stop exercising immediately and seek medical attention   Think about what you will eat, plan ahead. Choose " clean, green, fresh or frozen" over canned, processed or packaged foods which are more sugary, salty and fatty. 70 to 75% of food eaten should be vegetables and fruit. Three meals at set times with snacks allowed between meals, but they must be fruit or vegetables. Aim to eat over a 12 hour period , example 7 am to 7 pm, and STOP after  your last meal of the day. Drink water,generally about 64 ounces per day, no other drink is as healthy. Fruit juice is best enjoyed in a healthy way, by EATING the fruit. Thanks for choosing [facility], we consider it a privelige to serve you.

## 2020-10-01 ENCOUNTER — Other Ambulatory Visit: Payer: Self-pay | Admitting: *Deleted

## 2020-10-01 DIAGNOSIS — Z1231 Encounter for screening mammogram for malignant neoplasm of breast: Secondary | ICD-10-CM

## 2020-10-01 NOTE — Progress Notes (Signed)
tomo

## 2020-10-01 NOTE — Telephone Encounter (Signed)
Appointment scheduled.

## 2020-10-04 ENCOUNTER — Encounter: Payer: Self-pay | Admitting: Family Medicine

## 2020-10-04 NOTE — Assessment & Plan Note (Signed)
Controlled, no change in medication  

## 2020-10-04 NOTE — Assessment & Plan Note (Signed)
Bilateral, psoitive f/h of breast cancer in early 40's, refer for mammogram and [place]

## 2020-10-04 NOTE — Progress Notes (Signed)
Rebecca Boyd     MRN: [id number]      DOB: 04-18-1976   HPI Rebecca Boyd is here with a 2 week h/o bilateral breast and nipple pain , swelling and tenderness. Sister dx with breast ca in her early 52's Bilateral leg swelling worse in the recent week, has been evaluated by vascular surgery no significant underluing vascular pathology  ROS Denies recent fever or chills. Denies sinus pressure, nasal congestion, ear pain or sore throat. Denies chest congestion, productive cough or wheezing. Denies chest pains, palpitations PND or orthopnea. Denies abdominal pain, nausea, vomiting,diarrhea or constipation.   Denies dysuria, frequency, hesitancy or incontinence. Denies joint pain, swelling and limitation in mobility. Denies headaches, seizures, numbness, or tingling. Denies depression, anxiety or insomnia. Denies skin break down or rash.   PE  BP 136/82   Pulse 86   Temp 97.6 F (36.4 C) (Temporal)   Ht 5' (1.524 m)   Wt 183 lb (83 kg)   LMP 09/23/2020   SpO2 99%   BMI 35.74 kg/m   Patient alert and oriented and in no cardiopulmonary distress.  HEENT: No facial asymmetry, EOMI,     Neck supple .  Chest: Clear to auscultation bilaterally. Breast: na assymetry, no palpable mass, bilaterally tender, cystic, no nipple inversion or d/c . No supraclavicular or axillary lymphadenopathy CVS: S1, S2 no murmurs, no S3.Regular rate.  ABD: Soft non tender.   Ext: No edema  MS: Adequate ROM spine, shoulders, hips and knees.  Skin: Intact, no ulcerations or rash noted.  Psych: Good eye contact, normal affect. Memory intact not anxious or depressed appearing.  CNS: CN 2-12 intact, power,  normal throughout.no focal deficits noted.   Assessment & Plan  Breast pain Bilateral, psoitive f/h of breast cancer in early 40's, refer for mammogram and [place]  Essential hypertension Controlled, no change in medication DASH diet and commitment to daily physical activity for a minimum of  30 minutes discussed and encouraged, as a part of hypertension management. The importance of attaining a healthy weight is also discussed.  BP/Weight 09/30/2020 09/13/2020 09/08/2020 08/18/2020 08/17/2020 08/11/2020 12/30/5174  Systolic BP 160 737 106 269 485 462 703  Diastolic BP 82 90 77 95 89 98 88  Wt. (Lbs) 183 182.8 175 175 177 - 178  BMI 35.74 35.7 34.18 34.18 34.57 - 34.76  Some encounter information is confidential and restricted. Go to Review Flowsheets activity to see all data.       Major depression with psychotic features (HCC) Controlled, no change in medication   Obesity (BMI 30.0-34.9)  Patient re-educated about  the importance of commitment to a  minimum of 150 minutes of exercise per week as able.  The importance of healthy food choices with portion control discussed, as well as eating regularly and within a 12 hour window most days. The need to choose "clean , green" food 50 to 75% of the time is discussed, as well as to make water the primary drink and set a goal of 64 ounces water daily.    Weight /BMI 09/30/2020 09/13/2020 09/08/2020  WEIGHT 183 lb 182 lb 12.8 oz 175 lb  HEIGHT 5\' 0"  5\' 0"  5\' 0"   BMI 35.74 kg/m2 35.7 kg/m2 34.18 kg/m2  Some encounter information is confidential and restricted. Go to Review Flowsheets activity to see all data.      Leg swelling Intermittent bilateral leg swelling, lasix and potassium to be used as needed, judiciously Advised to reduce salt intake, increase  water and elevate legs

## 2020-10-04 NOTE — Assessment & Plan Note (Signed)
Intermittent bilateral leg swelling, lasix and potassium to be used as needed, judiciously Advised to reduce salt intake, increase water and elevate legs

## 2020-10-04 NOTE — Assessment & Plan Note (Signed)
Controlled, no change in medication DASH diet and commitment to daily physical activity for a minimum of 30 minutes discussed and encouraged, as a part of hypertension management. The importance of attaining a healthy weight is also discussed.  BP/Weight 09/30/2020 09/13/2020 09/08/2020 08/18/2020 08/17/2020 08/11/2020 01/10/2582  Systolic BP 462 194 712 527 129 290 903  Diastolic BP 82 90 77 95 89 98 88  Wt. (Lbs) 183 182.8 175 175 177 - 178  BMI 35.74 35.7 34.18 34.18 34.57 - 34.76  Some encounter information is confidential and restricted. Go to Review Flowsheets activity to see all data.

## 2020-10-04 NOTE — Assessment & Plan Note (Signed)
  Patient re-educated about  the importance of commitment to a  minimum of 150 minutes of exercise per week as able.  The importance of healthy food choices with portion control discussed, as well as eating regularly and within a 12 hour window most days. The need to choose "clean , green" food 50 to 75% of the time is discussed, as well as to make water the primary drink and set a goal of 64 ounces water daily.    Weight /BMI 09/30/2020 09/13/2020 09/08/2020  WEIGHT 183 lb 182 lb 12.8 oz 175 lb  HEIGHT 5\' 0"  5\' 0"  5\' 0"   BMI 35.74 kg/m2 35.7 kg/m2 34.18 kg/m2  Some encounter information is confidential and restricted. Go to Review Flowsheets activity to see all data.

## 2020-10-16 NOTE — Progress Notes (Signed)
Virtual Visit via Video Note  I connected with Rebecca Boyd on 10/19/20 at  1:20 PM EDT by a video enabled telemedicine application and verified that I am speaking with the correct person using two identifiers.  Location: Patient: home Provider: office Persons participated in the visit- patient, provider    I discussed the limitations of evaluation and management by telemedicine and the availability of in person appointments. The patient expressed understanding and agreed to proceed.    I discussed the assessment and treatment plan with the patient. The patient was provided an opportunity to ask questions and all were answered. The patient agreed with the plan and demonstrated an understanding of the instructions.   The patient was advised to call back or seek an in-person evaluation if the symptoms worsen or if the condition fails to improve as anticipated.  I provided 12 minutes of non-face-to-face time during this encounter.   [name], MD    [facility] MD/PA/NP OP Progress Note  10/19/2020 1:43 PM Rebecca Boyd  MRN:  [id number]  Chief Complaint:  Chief Complaint   Follow-up; Anxiety; Depression    HPI:  - She presented to ED for headache, r/o migraine. She was recommended for neurology referral.   This is a follow-up appointment for depression and anxiety.  She states that she has good days and bad days.  She tends to have bad days of having "evil attitude," feeling irritable.  She denies any act on her mood, and she becomes talkative.  She states that her mother is doing well.  She tends to lay around most of the time.  However, she is hoping to go to a beach trip sometime in the future.  She works third shift.  She occasionally has insomnia.  She feels fatigue.  She continues to gain weight, although her appetite has not changed.  She thinks this is coming from bupropion.  She denies SI.  She feels anxious sometimes.  She asks about her diagnosis as she saw "psychotic  disorder "in the chart.  She denies any recent hallucinations or paranoia.    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 GAD (generalized anxiety disorder)  F41.1     2. MDD (major depressive disorder), recurrent episode, mild ([place])  F33.0       Past Psychiatric History: Please see initial evaluation for full details. I have reviewed the history. No updates at this time.     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   Initial dx is  05/2013    Hypertension    Migraine    Migraines    Miscarriage 2009   Neck pain    Nondisplaced fracture of fifth metatarsal bone, left foot, subsequent encounter for fracture with routine healing 08/15/18 09/25/2018   Pruritus 09/29/2018   Pruritus ani 09/25/2018   Rash and nonspecific skin eruption 05/14/2017   Vaginitis    cyctitis    Past Surgical History:  Procedure Laterality Date   cervical cryotherapy N/A 1999   DILITATION & CURRETTAGE/HYSTROSCOPY WITH THERMACHOICE ABLATION  08/26/2013   Procedure: DILATATION & CURETTAGE/HYSTEROSCOPY WITH THERMACHOICE ENDOMETRIAL ABLATION Procedure #2 Total Therapy Time=min        sec;  Surgeon: Jonnie Kind, MD;  Location: [facility];  Service: Gynecology;;   LAPAROSCOPIC BILATERAL SALPINGECTOMY Bilateral 08/26/2013   Procedure: LAPAROSCOPIC BILATERAL SALPINGECTOMY AND REMOVAL OF LEFT PERITUBAL CYST Procedure #1;  Surgeon: Jonnie Kind, MD;  Location: [facility];  Service: Gynecology;  Laterality: Bilateral;   LAPAROSCOPIC LYSIS OF ADHESIONS  08/26/2013   Procedure: LAPAROSCOPIC LYSIS OF ADHESIONS Procedure #1;  Surgeon: Jonnie Kind, MD;  Location: [facility];  Service: Gynecology;;   VULVAR LESION REMOVAL  08/26/2013   Procedure: REMOVAL OF VULVAR SEBACEOUS CYST Procedure #3;  Surgeon: Jonnie Kind, MD;  Location: [facility];  Service: Gynecology;;    Family Psychiatric History: Please see initial evaluation for full details. I have reviewed the history. No updates at this time.     Family History:  Family History  Problem Relation Age of Onset   Drug abuse Mother    Hypertension Father    Breast cancer Sister        dx 74   Hypertension Maternal Grandmother    Hypertension Maternal Grandfather    Hypertension Paternal Grandmother    Breast cancer Paternal Grandmother        dx late 72s   Colon cancer Cousin 56    Social History:  Social History   Socioeconomic History   Marital status: Single    Spouse name: Not on file   Number of children: Not on file   Years of education: Not on file   Highest education level: Not on file  Occupational History   Not on file  Tobacco Use   Smoking status: Former    Packs/day: 0.00    Years: 1.00    Pack years: 0.00    Types: Cigars, Cigarettes    Quit date: 01/05/2017    Years since quitting: 3.7   Smokeless tobacco: Never  Vaping Use   Vaping Use: Never used  Substance and Sexual Activity   Alcohol use: Yes    Comment: occasional   Drug use: No   Sexual activity: Yes    Birth control/protection: None  Other Topics Concern   Not on file  Social History Narrative   Lives home with mother.  Works at  [employer].  Education 10th grade/GED.  No children.  Single.     Social Determinants of Health   Financial Resource Strain: Not on file  Food Insecurity: Not on file  Transportation Needs: Not on file  Physical Activity: Not on file  Stress: Not on file  Social Connections: Not on file    Allergies:  Allergies  Allergen Reactions   Diphenhydramine Hcl Anaphylaxis    (Benadryl)   Orange Fruit Hives, Shortness Of Breath, Itching and Other (See Comments)    MAKES IT DIFFICULT TO BREATH   Latex Rash    Metabolic Disorder  Labs: Lab Results  Component Value Date   HGBA1C 5.3 03/18/2020   MPG 103 06/10/2018   MPG 108 05/29/2013   No results found for: PROLACTIN Lab Results  Component Value Date   CHOL 192 03/18/2020   TRIG 76 03/18/2020   HDL 45 03/18/2020   CHOLHDL 4.3 03/18/2020   VLDL 19 05/10/2016   LDLCALC 133 (H) 03/18/2020   LDLCALC 118 (H) 06/10/2018   Lab Results  Component Value Date   TSH 0.951 03/18/2020   TSH 1.67 06/10/2018    Therapeutic Level Labs: No results found for: LITHIUM No results found for: VALPROATE No components found for:  CBMZ  Current Medications: Current Outpatient Medications  Medication Sig Dispense Refill   vortioxetine HBr (TRINTELLIX) 5 MG TABS tablet Take 1 tablet (5 mg total) by mouth daily. 30 tablet 1   ALBUTEROL SULFATE HFA IN Inhale 2 puffs into the lungs.     amLODipine (NORVASC) 10 MG tablet Take 1 tablet (10 mg total) by mouth daily. 90 tablet 3   benzonatate (TESSALON PERLES) 100 MG capsule Take 1 capsule (100 mg total) by mouth every 6 (six) hours as needed for cough. 30 capsule 0   betamethasone dipropionate 0.05 % cream Apply topically 2 (two) times daily. 45 g 0   colchicine 0.6 MG tablet Take 1 tablet (0.6 mg total) by mouth See admin instructions. Take 2 tablets PO now, then take 1 tablet PO in 1 hour 3 tablet 0   Erenumab-aooe (AIMOVIG) 70 MG/ML SOAJ Inject 70 mg into the skin every 30 (thirty) days. 1  pen 11   ergocalciferol (VITAMIN D2) 1.25 MG (50000 UT) capsule Take 1 capsule (50,000 Units total) by mouth once a week. One capsule once weekly 12 capsule 2   furosemide (LASIX) 20 MG tablet Take one tablet twice weekly by mouth , as needed, for leg swelling 20 tablet 0   gabapentin (NEURONTIN) 300 MG capsule Take 1 capsule (300 mg total) by mouth at bedtime. 42 capsule 0   ibuprofen (ADVIL) 400 MG tablet Take 1 tablet (400 mg total) by mouth 3 (three) times daily. 15 tablet 0   metroNIDAZOLE (METROGEL VAGINAL) 0.75 % vaginal gel Place 1 Applicatorful vaginally 2 (two) times daily. 70 g 2   montelukast (SINGULAIR) 10 MG tablet Take 1 tablet (10 mg total) by mouth at bedtime. 30 tablet 3   oxyCODONE-acetaminophen (PERCOCET/ROXICET) 5-325 MG tablet Take 1-2 tablets by mouth 2 (two) times daily as needed.      SUMAtriptan (IMITREX) 6 MG/0.5ML SOLN injection Use one injection at onset of migraine.  May repeat in 2 hrs, if needed.  Max dose: 2 inj/day. This is a 30 day prescription. 5 mL 11   topiramate (TOPAMAX) 100 MG tablet Take 1 tablet (100 mg total) by mouth 2 (two) times daily. 180 tablet 4   triamterene-hydrochlorothiazide (MAXZIDE) 75-50 MG tablet Take 1 tablet by mouth daily. 30 tablet 4   No current facility-administered medications for this visit.     Musculoskeletal: Strength & Muscle Tone:  N/A Gait & Station:  N/A Patient leans: N/A  Psychiatric Specialty Exam: Review of Systems  Psychiatric/Behavioral:  Positive for dysphoric mood and sleep disturbance. Negative for agitation, behavioral problems, confusion, decreased concentration, hallucinations, self-injury and suicidal ideas. The patient is nervous/anxious. The patient is not hyperactive.   All other systems reviewed and are negative.  Last menstrual period 09/23/2020.There is no height or weight on file to calculate BMI.  General Appearance: Fairly Groomed  Eye Contact:  Good  Speech:  Clear and Coherent  Volume:   Normal  Mood:  Anxious  Affect:  Appropriate, Congruent, and calm  Thought Process:  Coherent  Orientation:  Full (Time, Place, and Person)  Thought Content: Logical   Suicidal Thoughts:  No  Homicidal Thoughts:  No  Memory:  Immediate;   Good  Judgement:  Good  Insight:  Fair  Psychomotor Activity:  Normal  Concentration:  Concentration: Good and Attention Span: Good  Recall:  Good  Fund of Knowledge: Good  Language: Good  Akathisia:  No  Handed:  Right  AIMS (if indicated): not done  Assets:  Communication Skills Desire for Improvement  ADL's:  Intact  Cognition: WNL  Sleep:  Poor   Screenings: GAD-7    Flowsheet Row Office Visit from 12/10/2019 in [facility] Office Visit from 10/28/2019 in [facility] Office Visit from 09/25/2019 in [facility] Office Visit from 05/21/2019 in [facility]  Total GAD-7 Score 14 15 6 18       PHQ2-9    [place] Office Visit from 09/30/2020 in [facility] Office Visit from 09/13/2020 in [facility] Video Visit from 08/19/2020 in [facility] Office Visit from 08/17/2020 in [facility] Video Visit from 08/12/2020 in [facility]  PHQ-2 Total Score 1 2 4  0 0  PHQ-9 Total Score 6 7 11  -- --      Flowsheet Row Video Visit from 10/19/2020 in [facility] from 09/08/2020 in [facility] Video Visit from 08/19/2020 in [place] [facility] No Risk No Risk No Risk        Assessment and Plan:  Rebecca Boyd is a 45 y.o. year old female with a history of Depressive Disorder NOS and Psychotic Disorder NOS, hypertension, migraine, who presents for follow up appointment for below.   1. GAD (generalized anxiety disorder) 2. MDD (major depressive disorder), recurrent episode, mild ([place]) She continues to report depressive symptoms and  anxiety without significant triggers. Psychosocial stressors includes loss of her maternal uncle, taking care of her mother with COPD, and her sister with breast cancer.  There has been significant weight gain, which she attributes to bupropion.  We will switch from bupropion to Trintellix to see if this mitigate the potential side effect.    # obstructive sleep apnea # fatigue Reviewed sleep study on 03/2020. AHI 6.9. Recommended weight loss, CPAP, oral surgery or appliance.  She has been followed by her clinic, and is awaiting to get CPAP machine.    Plan Discontinue bupropion 2  Start Trintellix 5 mg daily Next appointment-August 16 at 120 for 20 minutes, video - TSH was reportedly checked in late 2021; wnl   Past trials of medication: sertraline, fluoxetine (pruritis), lexapro (weight gain), venlafaxine (leg edema), duloxetine (weakness), quetiapine, Xanax   The patient demonstrates the following risk factors for suicide: Chronic risk factors for suicide include: psychiatric disorder of depression. Acute risk factors for suicide include: loss (financial, interpersonal, professional). Protective factors for this patient include: coping skills and hope for the future. Considering these factors, the overall suicide risk at this point appears to be low. Patient is appropriate for outpatient follow up.      [name], MD 10/19/2020, 1:43 PM

## 2020-10-19 ENCOUNTER — Encounter: Payer: Self-pay | Admitting: Psychiatry

## 2020-10-19 ENCOUNTER — Other Ambulatory Visit: Payer: Self-pay

## 2020-10-19 ENCOUNTER — Telehealth (INDEPENDENT_AMBULATORY_CARE_PROVIDER_SITE_OTHER): Payer: BC Managed Care – PPO | Admitting: Psychiatry

## 2020-10-19 DIAGNOSIS — F411 Generalized anxiety disorder: Secondary | ICD-10-CM | POA: Diagnosis not present

## 2020-10-19 DIAGNOSIS — F330 Major depressive disorder, recurrent, mild: Secondary | ICD-10-CM | POA: Diagnosis not present

## 2020-10-19 MED ORDER — VORTIOXETINE HBR 5 MG PO TABS: 5.0000 mg | ORAL_TABLET | Freq: Every day | ORAL | 1 refills | Status: DC

## 2020-10-19 NOTE — Patient Instructions (Signed)
Discontinue bupropion Third Trintellix 5 mg daily Next appointment-August 16 at 1:20

## 2020-10-20 ENCOUNTER — Telehealth: Payer: Self-pay

## 2020-10-20 NOTE — Telephone Encounter (Signed)
received fax request for a prior auth for the trintelix

## 2020-10-20 NOTE — Telephone Encounter (Signed)
went online to [email or web address] and submitted the prior auth . - pending 

## 2020-10-20 NOTE — Telephone Encounter (Signed)
received fax that trintellix was approved from 10-20-20 to 10-20-21

## 2020-10-21 ENCOUNTER — Ambulatory Visit (INDEPENDENT_AMBULATORY_CARE_PROVIDER_SITE_OTHER): Payer: BC Managed Care – PPO | Admitting: Family Medicine

## 2020-10-21 ENCOUNTER — Other Ambulatory Visit (HOSPITAL_COMMUNITY)
Admission: RE | Admit: 2020-10-21 | Discharge: 2020-10-21 | Disposition: A | Discharge status: Home / Self Care | Payer: BC Managed Care – PPO | Source: Ambulatory Visit | Attending: Family Medicine | Admitting: Family Medicine

## 2020-10-21 ENCOUNTER — Other Ambulatory Visit: Payer: Self-pay

## 2020-10-21 ENCOUNTER — Encounter: Payer: Self-pay | Admitting: Family Medicine

## 2020-10-21 VITALS — BP 130/84 | HR 82 | Temp 98.3°F | Resp 20 | Ht 60.0 in | Wt 181.0 lb

## 2020-10-21 DIAGNOSIS — Z124 Encounter for screening for malignant neoplasm of cervix: Secondary | ICD-10-CM | POA: Diagnosis not present

## 2020-10-21 DIAGNOSIS — Z0000 Encounter for general adult medical examination without abnormal findings: Secondary | ICD-10-CM

## 2020-10-21 DIAGNOSIS — N760 Acute vaginitis: Secondary | ICD-10-CM

## 2020-10-21 DIAGNOSIS — Z13220 Encounter for screening for lipoid disorders: Secondary | ICD-10-CM | POA: Diagnosis not present

## 2020-10-21 DIAGNOSIS — Z0001 Encounter for general adult medical examination with abnormal findings: Secondary | ICD-10-CM | POA: Diagnosis not present

## 2020-10-21 DIAGNOSIS — I10 Essential (primary) hypertension: Secondary | ICD-10-CM

## 2020-10-21 DIAGNOSIS — Z1211 Encounter for screening for malignant neoplasm of colon: Secondary | ICD-10-CM | POA: Diagnosis not present

## 2020-10-21 DIAGNOSIS — L918 Other hypertrophic disorders of the skin: Secondary | ICD-10-CM

## 2020-10-21 DIAGNOSIS — E559 Vitamin D deficiency, unspecified: Secondary | ICD-10-CM | POA: Diagnosis not present

## 2020-10-21 DIAGNOSIS — R21 Rash and other nonspecific skin eruption: Secondary | ICD-10-CM

## 2020-10-21 LAB — CERVICOVAGINAL ANCILLARY ONLY
Bacterial Vaginitis (gardnerella): POSITIVE — AB
Candida Glabrata: NEGATIVE
Candida Vaginitis: NEGATIVE
Chlamydia: NEGATIVE
Comment: NEGATIVE
Comment: NEGATIVE
Comment: NEGATIVE
Comment: NEGATIVE
Comment: NEGATIVE
Comment: NORMAL
Neisseria Gonorrhea: NEGATIVE
Trichomonas: NEGATIVE

## 2020-10-21 NOTE — Patient Instructions (Addendum)
F/u in 6 months, call if you need me sooner  No immunization due at this but not absolutely required  Fasting lipid, cmp and eGFr, tSH and vit D today  Pap and STD testing today  You are referred for colonoscopy in November, also to dermatology at [facility]  It is important that you exercise regularly at least 30 minutes 5 times a week. If you develop chest pain, have severe difficulty breathing, or feel very tired, stop exercising immediately and seek medical attention  Think about what you will eat, plan ahead. Choose " clean, green, fresh or frozen" over canned, processed or packaged foods which are more sugary, salty and fatty. 70 to 75% of food eaten should be vegetables and fruit. Three meals at set times with snacks allowed between meals, but they must be fruit or vegetables. Aim to eat over a 12 hour period , example 7 am to 7 pm, and STOP after  your last meal of the day. Drink water,generally about 64 ounces per day, no other drink is as healthy. Fruit juice is best enjoyed in a healthy way, by EATING the fruit.

## 2020-10-22 ENCOUNTER — Telehealth: Payer: Self-pay

## 2020-10-22 LAB — CMP14+EGFR
ALT: 15 IU/L (ref 0–32)
AST: 17 IU/L (ref 0–40)
Albumin/Globulin Ratio: 2 (ref 1.2–2.2)
Albumin: 4.7 g/dL (ref 3.8–4.8)
Alkaline Phosphatase: 107 IU/L (ref 44–121)
BUN/Creatinine Ratio: 10 (ref 9–23)
BUN: 7 mg/dL (ref 6–24)
Bilirubin Total: 0.2 mg/dL (ref 0.0–1.2)
CO2: 21 mmol/L (ref 20–29)
Calcium: 9.8 mg/dL (ref 8.7–10.2)
Chloride: 101 mmol/L (ref 96–106)
Creatinine, Ser: 0.69 mg/dL (ref 0.57–1.00)
Globulin, Total: 2.4 g/dL (ref 1.5–4.5)
Glucose: 102 mg/dL — ABNORMAL HIGH (ref 65–99)
Potassium: 3.7 mmol/L (ref 3.5–5.2)
Sodium: 140 mmol/L (ref 134–144)
Total Protein: 7.1 g/dL (ref 6.0–8.5)
eGFR: 110 mL/min/{1.73_m2} (ref >59)

## 2020-10-22 LAB — LIPID PANEL
Chol/HDL Ratio: 3.6 ratio (ref 0.0–4.4)
Cholesterol, Total: 188 mg/dL (ref 100–199)
HDL: 52 mg/dL (ref >39)
LDL Chol Calc (NIH): 122 mg/dL — ABNORMAL HIGH (ref 0–99)
Triglycerides: 75 mg/dL (ref 0–149)
VLDL Cholesterol Cal: 14 mg/dL (ref 5–40)

## 2020-10-22 LAB — VITAMIN D 25 HYDROXY (VIT D DEFICIENCY, FRACTURES): Vit D, 25-Hydroxy: 16.1 ng/mL — ABNORMAL LOW (ref 30.0–100.0)

## 2020-10-22 LAB — TSH: TSH: 1.14 u[IU]/mL (ref 0.450–4.500)

## 2020-10-22 MED ORDER — METRONIDAZOLE 0.75 % EX GEL: 1.0000 "application " | Freq: Two times a day (BID) | CUTANEOUS | 0 refills | Status: DC

## 2020-10-22 MED ORDER — VITAMIN D (ERGOCALCIFEROL) 1.25 MG (50000 UNIT) PO CAPS: 50000.0000 [IU] | ORAL_CAPSULE | ORAL | 3 refills | Status: DC

## 2020-10-22 NOTE — Telephone Encounter (Signed)
-----   Message from Fayrene Helper, MD sent at 10/22/2020  7:43 AM EDT ----- Pls advise has bV and erx metrogel tab 500mg  twice daily for 7 days, or metrogel twice daily for 1 week, 1 applicator ful Pls send and advise once weekly vit D 50, 000 IU x 9 months total. Needs to reduce fatty foods

## 2020-10-23 ENCOUNTER — Encounter: Payer: Self-pay | Admitting: Family Medicine

## 2020-10-23 DIAGNOSIS — N760 Acute vaginitis: Secondary | ICD-10-CM | POA: Insufficient documentation

## 2020-10-23 NOTE — Assessment & Plan Note (Signed)

## 2020-10-23 NOTE — Progress Notes (Signed)
    Rebecca Boyd     MRN: [id number]      DOB: October 19, 1975  HPI: Patient is in for annual physical exam. C/o generalized rash and skin tag on cheek, wants referral to Dermatology in [place],  are reviewed. Immunization is reviewed , and  updated if needed.   PE: BP 130/84   Pulse 82   Temp 98.3 F (36.8 C)   Resp 20   Ht 5' (1.524 m)   Wt 181 lb (82.1 kg)   LMP 09/23/2020   SpO2 96%   BMI 35.35 kg/m   Pleasant  female, alert and oriented x 3, in no cardio-pulmonary distress. Afebrile. HEENT No facial trauma or asymetry. Sinuses non tender.  Extra occullar muscles intact.. External ears normal, . Neck: supple, no adenopathy,JVD or thyromegaly.No bruits.  Chest: Clear to ascultation bilaterally.No crackles or wheezes. Non tender to palpation  Breast: No asymetry,no masses or lumps. No tenderness. No nipple discharge or inversion. No axillary or supraclavicular adenopathy  Cardiovascular system; Heart sounds normal,  S1 and  S2 ,no S3.  No murmur, or thrill. Apical beat not displaced Peripheral pulses normal.  Abdomen: Soft, non tender, no organomegaly or masses. No bruits. Bowel sounds normal. No guarding, tenderness or rebound.   GU: External genitalia normal female genitalia , normal female distribution of hair. No lesions. Urethral meatus normal in size, no  Prolapse, no lesions visibly  Present. Bladder non tender. Vagina pink and moist , with no visible lesions , discharge present . Adequate pelvic support no  cystocele or rectocele noted Cervix pink and appears healthy, no lesions or ulcerations noted, no discharge noted from os Uterus normal size, no adnexal masses, no cervical motion or adnexal tenderness.   Musculoskeletal exam: Full ROM of spine, hips , shoulders and knees. No deformity ,swelling or crepitus noted. No muscle wasting or atrophy.   Neurologic: Cranial nerves 2 to 12 intact. Power, tone ,sensation and reflexes  normal throughout. No disturbance in gait. No tremor.  Skin: Intact, no ulceration or erythema scaling rash noted. Pigmentation normal throughout Skin tag on left cheek  Psych; Normal mood and affect. Judgement and concentration normal   Assessment & Plan:  No problem-specific Assessment & Plan notes found for this encounter.

## 2020-10-23 NOTE — Assessment & Plan Note (Signed)
Requests STD testing, specimens sent, counseled re need to use condoms all the time

## 2020-10-25 LAB — CYTOLOGY - PAP
Comment: NEGATIVE
Diagnosis: NEGATIVE
High risk HPV: NEGATIVE

## 2020-10-26 ENCOUNTER — Ambulatory Visit: Payer: BC Managed Care – PPO | Admitting: Neurology

## 2020-10-27 ENCOUNTER — Encounter: Payer: Self-pay | Admitting: Family Medicine

## 2020-10-28 ENCOUNTER — Encounter: Payer: Self-pay | Admitting: Neurology

## 2020-10-28 ENCOUNTER — Ambulatory Visit: Payer: BC Managed Care – PPO | Admitting: Neurology

## 2020-10-28 NOTE — Progress Notes (Deleted)
PATIENT: Rebecca Boyd DOB: 1975/05/30  REASON FOR VISIT: follow up HISTORY FROM: patient  HISTORY OF PRESENT ILLNESS: Today 10/28/20  HISTORY HISTORY OF PRESENT ILLNESS: Rebecca Boyd a 45 years old right-handed female, seen in refer by her primary care doctor Rebecca Boyd, for evaluation of headache, initial evaluation was December 06 2016.   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   She reported a history of migraine headaches since 2007, used to be intermittent, couple times each months, but since June 2018, she reported increased headache, to almost daily bases, constant low-grade lateralized headache, frequent exacerbation to severe pounding headache with associated light noise sensitivity, nauseous, sometimes associated seeing flashlight prior to and during her severe migraine headache, her headache can last for couple days,   She was started on Topamax 100 mg twice a day about 2 months ago, with mild side effect, but no significant help, she also takes Imitrex 50 mg as needed, is helpful most of the time,   Trigger for her migraine headaches are weather change, bright light, strong smells,   UPDATE Sept 6 2018: She has tried imitrex 50mg  as needed few times over past one month, she throw up each time.  Today, she complains of 2 weeks of severe left parietal area sharp headaches,she has tried imitrex, phenergan did not help.    UPDATE Feb 4th 2019:  Her migraine has much improved, now she has it about twice a month, lasting for 2 days, she throw up maxalt dissolvable sometimes because of severe nausea during  migraine headaches,    UPDATE 8/5/2019CM Rebecca Boyd, 45 year old female returns for follow-up with history of migraine headaches.  She is doing much better with her migraines has maybe 2/ month.  She is currently on nortriptyline 20 mg at bedtime Topamax 100  twice a day, Imitrex and Maxalt acutely as needed.  She is not aware of any specific foods that cause problems.  She returns for reevaluation   Virtual visit July 31 2018: She still has frequent migraine headaches, in one month, she can have headache days up to 10 days in a month, she often has to use imitrex injection twice in 1 day for her migraine. Sometimes, she comes to office for infusions.   Update 02/11/2019 SS: She was in the office 12/05/2018 for infusion Depacon, Compazine, Toradol. At her PCP 02/05/2019 given toradol and depo medrol IM injection. She is prescribed oxycodone for chronic back pain from PCP. She remain on Aimovig 70 mg monthly injection, Topamax 100 mg BID, and Zanaflex, Imitrex injection as needed. Before the Aimovig injection, she was having 4 bad migraines, now having 2 migraines a month. She is out of Zanaflex. The imitrex injection will improve the headache, not always relieve it. Otherwise health is doing good. She is out of work currently due to the coronavirus pandemic. She is a Glass blower/designer.  Her headaches have improved 50% with Aimovig.  She is tolerating medications well without side effect.   Update August 12, 2019 SS: She is currently taking Aimovig 70 mg, Topamax 100 mg twice a day, and Imitrex injection as needed.  Since last seen, headaches have been quite well, only 2 migraines.  Is tolerating medications well.  She is on unemployment, her job was discontinued.  She is taking Xanax for anxiety, oxycodone for chronic back pain, says PCP will not refill.  Update October 28, 2020 SS:   REVIEW OF SYSTEMS: Out of a complete 14 system review of symptoms, the patient complains only of the following symptoms, and all other reviewed systems are negative.  Headache  ALLERGIES: Allergies  Allergen Reactions   Diphenhydramine Hcl Anaphylaxis    (Benadryl)   Orange Fruit Hives, Shortness Of Breath, Itching and Other (See Comments)    MAKES IT DIFFICULT TO BREATH    Latex Rash    HOME MEDICATIONS: Outpatient Medications Prior to Visit  Medication Sig Dispense Refill   ALBUTEROL SULFATE HFA IN Inhale 2 puffs into the lungs.     amLODipine (NORVASC) 10 MG tablet Take 1 tablet (10 mg total) by mouth daily. 90 tablet 3   benzonatate (TESSALON PERLES) 100 MG capsule Take 1 capsule (100 mg total) by mouth every 6 (six) hours as needed for cough. 30 capsule 0   betamethasone dipropionate 0.05 % cream Apply topically 2 (two) times daily. 45 g 0   Erenumab-aooe (AIMOVIG) 70 MG/ML SOAJ Inject 70 mg into the skin every 30 (thirty) days. 1 pen 11   ergocalciferol (VITAMIN D2) 1.25 MG (50000 UT) capsule Take 1 capsule (50,000 Units total) by mouth once a week. One capsule once weekly 12 capsule 2   furosemide (LASIX) 20 MG tablet Take one tablet twice weekly by mouth , as needed, for leg swelling 20 tablet 0   gabapentin (NEURONTIN) 300 MG capsule Take 1 capsule (300 mg total) by mouth at bedtime. 42 capsule 0   ibuprofen (ADVIL) 400 MG tablet Take 1 tablet (400 mg total) by mouth 3 (three) times daily. 15 tablet 0   metroNIDAZOLE (METROGEL VAGINAL) 0.75 % vaginal gel Place 1 Applicatorful vaginally 2 (two) times daily. 70 g 2   metroNIDAZOLE (METROGEL) 0.75 % gel Apply 1 application topically 2 (two) times daily. 45 g 0   montelukast (SINGULAIR) 10 MG tablet Take 1 tablet (10 mg total) by mouth at bedtime. 30 tablet 3   oxyCODONE-acetaminophen (PERCOCET/ROXICET) 5-325 MG tablet Take 1-2 tablets by mouth 2 (two) times daily as needed.      SUMAtriptan (IMITREX) 6 MG/0.5ML SOLN injection Use one injection at onset of migraine.  May repeat in 2 hrs, if needed.  Max dose: 2 inj/day. This is a 30 day prescription. 5 mL 11   topiramate (TOPAMAX) 100 MG tablet Take 1 tablet (100 mg total) by mouth 2 (two) times daily. 180 tablet 4   triamterene-hydrochlorothiazide (MAXZIDE) 75-50 MG tablet Take 1 tablet by mouth daily. 30 tablet 4   Vitamin D, Ergocalciferol, (DRISDOL) 1.25  MG (50000 UNIT) CAPS capsule Take 1 capsule (50,000 Units total) by mouth every 7 (seven) days. 12 capsule 3   vortioxetine HBr (TRINTELLIX) 5 MG TABS tablet Take 1 tablet (5 mg total) by mouth daily. 30 tablet 1   No facility-administered medications prior to visit.    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   Initial  dx is 05/2013    Hypertension    Migraine    Migraines    Miscarriage 2009   Neck pain    Nondisplaced fracture of fifth metatarsal bone, left foot, subsequent encounter for fracture with routine healing 08/15/18 09/25/2018   Pruritus 09/29/2018   Pruritus ani 09/25/2018   Rash and nonspecific skin eruption 05/14/2017   Vaginitis    cyctitis    PAST SURGICAL HISTORY: Past Surgical History:  Procedure Laterality Date   cervical cryotherapy N/A 1999   DILITATION & CURRETTAGE/HYSTROSCOPY WITH THERMACHOICE ABLATION  08/26/2013   Procedure: DILATATION & CURETTAGE/HYSTEROSCOPY WITH THERMACHOICE ENDOMETRIAL ABLATION Procedure #2 Total Therapy Time=min       sec;  Surgeon: Jonnie Kind, MD;  Location: [facility];  Service: Gynecology;;   LAPAROSCOPIC BILATERAL SALPINGECTOMY Bilateral 08/26/2013   Procedure: LAPAROSCOPIC BILATERAL SALPINGECTOMY AND REMOVAL OF LEFT PERITUBAL CYST Procedure #1;  Surgeon: Jonnie Kind, MD;  Location: [facility];  Service: Gynecology;  Laterality: Bilateral;   LAPAROSCOPIC LYSIS OF ADHESIONS  08/26/2013   Procedure: LAPAROSCOPIC LYSIS OF ADHESIONS Procedure #1;  Surgeon: Jonnie Kind,  MD;  Location: [facility];  Service: Gynecology;;   VULVAR LESION REMOVAL  08/26/2013   Procedure: REMOVAL OF VULVAR SEBACEOUS CYST Procedure #3;  Surgeon: Jonnie Kind, MD;  Location: [facility];  Service: Gynecology;;    FAMILY HISTORY: Family History  Problem Relation Age of Onset   Drug abuse Mother    Hypertension Father    Breast cancer Sister        dx 39   Hypertension Maternal Grandmother    Hypertension Maternal Grandfather    Hypertension Paternal Grandmother    Breast cancer Paternal Grandmother        dx late 56s   Colon cancer Cousin 63    SOCIAL HISTORY: Social History   Socioeconomic History   Marital status: Single    Spouse name: Not on file   Number of children: Not on file   Years of education: Not on file   Highest education level: Not on file  Occupational History   Not on file  Tobacco Use   Smoking status: Former    Packs/day: 0.00    Years: 1.00    Pack years: 0.00    Types: Cigars, Cigarettes    Quit date: 01/05/2017    Years since quitting: 3.8   Smokeless tobacco: Never  Vaping Use   Vaping Use: Never used  Substance and Sexual Activity   Alcohol use: Yes    Comment: occasional   Drug use: No   Sexual activity: Yes    Birth control/protection: None  Other Topics Concern   Not on file  Social History Narrative   Lives home with mother.  Works at [employer].  Education 10th grade/GED.  No children.  Single.     Social Determinants of Health   Financial Resource Strain: Not on file  Food Insecurity: Not on file  Transportation Needs: Not on file  Physical Activity: Not on file  Stress: Not on file  Social Connections: Not on file  Intimate Partner Violence: Not on file   PHYSICAL EXAM  There were no vitals filed for this visit.  There is no height or weight on file to calculate BMI.  Generalized: Well developed, in no acute distress   Neurological examination  Mentation: Alert oriented to time, place, history taking.  Follows all commands speech and language fluent Cranial nerve II-XII: Pupils were equal round reactive to  light. Extraocular movements were full, visual field were full on confrontational test. Facial sensation and strength were normal. Head turning and shoulder shrug  were normal and symmetric. Motor: The motor testing reveals 5 over 5 strength of all 4 extremities. Good symmetric motor tone is noted throughout.  Sensory: Sensory testing is intact to soft touch on all 4 extremities. No evidence of extinction is noted.  Coordination: Cerebellar testing reveals good finger-nose-finger and heel-to-shin bilaterally.  Gait and station: Gait is normal. Tandem gait is normal. Romberg is negative. No drift is seen.  Reflexes: Deep tendon reflexes are symmetric and normal bilaterally.   DIAGNOSTIC DATA (LABS, IMAGING, TESTING) - I reviewed patient records, labs, notes, testing and imaging myself where available.  Lab Results  Component Value Date   WBC 5.2 09/08/2020   HGB 13.7 09/08/2020   HCT 40.7 09/08/2020   MCV 88.5 09/08/2020   PLT 221 09/08/2020      Component Value Date/Time   NA 140 10/21/2020 0944   K 3.7 10/21/2020 0944   CL 101 10/21/2020 0944   CO2 21 10/21/2020 0944   GLUCOSE 102 (H) 10/21/2020 0944   GLUCOSE 97 09/08/2020 1044   BUN 7 10/21/2020 0944   CREATININE 0.69 10/21/2020 0944   CREATININE 0.75 06/10/2018 0831   CALCIUM 9.8 10/21/2020 0944   PROT 7.1 10/21/2020 0944   ALBUMIN 4.7 10/21/2020 0944   AST 17 10/21/2020 0944   ALT 15 10/21/2020 0944   ALKPHOS 107 10/21/2020 0944   BILITOT 0.2 10/21/2020 0944   GFRNONAA >60 09/08/2020 1044   GFRNONAA 98 06/10/2018 0831   GFRAA >60 07/11/2019 0629   GFRAA 114 06/10/2018 0831   Lab Results  Component Value Date   CHOL 188 10/21/2020   HDL 52 10/21/2020   LDLCALC 122 (H) 10/21/2020   TRIG 75 10/21/2020   CHOLHDL 3.6 10/21/2020   Lab Results  Component Value Date   HGBA1C 5.3 03/18/2020   Lab Results   Component Value Date   VITAMINB12 450 10/26/2010   Lab Results  Component Value Date   TSH 1.140 10/21/2020      ASSESSMENT AND PLAN 45 y.o. 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 here with:  1.  Chronic migraine headache -Headaches currently under excellent control, only 2 migraines in the last 6 months -Continue Aimovig 70 mg monthly injection -Continue Topamax 100 mg twice a day -Continue Imitrex injection as needed for acute headache  -She will need to discuss with PCP about filling Xanax, oxycodone, we have never filled this -Follow-up in 1 year or sooner if needed   I spent 20 minutes of face-to-face and non-face-to-face time with patient.  This included previsit chart review, lab review, study review, order entry, electronic health record documentation, patient education.    Butler Denmark, AGNP-C, DNP 10/28/2020, 5:49 AM [facility] [address], [place], [place] [address] [phone]

## 2020-10-29 NOTE — Telephone Encounter (Signed)
Can you check and see if her appt on file is ok with her or if she wants it changed to somewhere else

## 2020-11-02 ENCOUNTER — Encounter: Payer: Self-pay | Admitting: Internal Medicine

## 2020-11-10 ENCOUNTER — Telehealth: Payer: Self-pay | Admitting: Orthopedic Surgery

## 2020-11-10 DIAGNOSIS — G5603 Carpal tunnel syndrome, bilateral upper limbs: Secondary | ICD-10-CM | POA: Diagnosis not present

## 2020-11-10 NOTE — Telephone Encounter (Signed)
Patient called to relay she has lost the braces for her wrists for the carpal tunnel. States also had "sleeves" and has lost those as well.  I relayed that if insurance covered any of the braces that they likely may not cover again. Please advise.

## 2020-11-10 NOTE — Telephone Encounter (Signed)
Some plans may only cover braces once, not sure, but we can give her the CT braces and she will have to sign, same as always, if not covered she will get a bill. I don't have sleeves. I called her to advise. She will come by and get those.    She said braces not lost , worn out.  Ok to provide replacements per Dr Aline Brochure

## 2020-11-20 ENCOUNTER — Emergency Department (HOSPITAL_COMMUNITY): Payer: BC Managed Care – PPO

## 2020-11-20 ENCOUNTER — Other Ambulatory Visit: Payer: Self-pay

## 2020-11-20 ENCOUNTER — Emergency Department (HOSPITAL_COMMUNITY)
Admission: EM | Admit: 2020-11-20 | Discharge: 2020-11-20 | Disposition: A | Discharge status: Home / Self Care | Payer: BC Managed Care – PPO | Attending: Emergency Medicine | Admitting: Emergency Medicine

## 2020-11-20 ENCOUNTER — Encounter (HOSPITAL_COMMUNITY): Payer: Self-pay | Admitting: *Deleted

## 2020-11-20 DIAGNOSIS — Z91040 Latex allergy status: Secondary | ICD-10-CM | POA: Diagnosis not present

## 2020-11-20 DIAGNOSIS — I10 Essential (primary) hypertension: Secondary | ICD-10-CM | POA: Diagnosis not present

## 2020-11-20 DIAGNOSIS — Z7952 Long term (current) use of systemic steroids: Secondary | ICD-10-CM | POA: Insufficient documentation

## 2020-11-20 DIAGNOSIS — R202 Paresthesia of skin: Secondary | ICD-10-CM | POA: Diagnosis not present

## 2020-11-20 DIAGNOSIS — R519 Headache, unspecified: Secondary | ICD-10-CM | POA: Diagnosis not present

## 2020-11-20 DIAGNOSIS — Z79899 Other long term (current) drug therapy: Secondary | ICD-10-CM | POA: Diagnosis not present

## 2020-11-20 DIAGNOSIS — Z87891 Personal history of nicotine dependence: Secondary | ICD-10-CM | POA: Diagnosis not present

## 2020-11-20 MED ORDER — SODIUM CHLORIDE 0.9 % IV BOLUS
1000.0000 mL | Freq: Once | INTRAVENOUS | Status: AC
  Administered 2020-11-20: 1000 mL via INTRAVENOUS

## 2020-11-20 MED ORDER — KETOROLAC TROMETHAMINE 15 MG/ML IJ SOLN
15.0000 mg | Freq: Once | INTRAMUSCULAR | Status: AC
  Administered 2020-11-20: 15 mg via INTRAVENOUS
  Filled 2020-11-20: qty 1

## 2020-11-20 MED ORDER — METOCLOPRAMIDE HCL 5 MG/ML IJ SOLN
10.0000 mg | Freq: Once | INTRAMUSCULAR | Status: AC
  Administered 2020-11-20: 10 mg via INTRAVENOUS
  Filled 2020-11-20: qty 2

## 2020-11-20 MED ORDER — ONDANSETRON HCL 4 MG/2ML IJ SOLN
4.0000 mg | Freq: Once | INTRAMUSCULAR | Status: AC
  Administered 2020-11-20: 4 mg via INTRAVENOUS
  Filled 2020-11-20: qty 2

## 2020-11-20 NOTE — Discharge Instructions (Addendum)
Suspect you had a migraine/tension headache, please continue with your migraine medications, I also recommend over-the-counter pain medications, please decrease stress, drink plenty of fluids, exercise regularly as is, decrease headaches.  Follow-up with your neurologist for further evaluation.  Come back to the emergency department if you develop chest pain, shortness of breath, severe abdominal pain, uncontrolled nausea, vomiting, diarrhea.

## 2020-11-20 NOTE — ED Triage Notes (Signed)
Pt with sharp pains more on right side than left since Thursday.  Numbness and weakness to bilateral hands.  Swelling noted to bilateral feet.

## 2020-11-20 NOTE — ED Provider Notes (Signed)
[facility] EMERGENCY DEPARTMENT Provider Note   CSN: [id number] Arrival date & time: 11/20/20  1221     History Chief Complaint  Patient presents with   Headache    Rebecca Boyd is a 45 y.o. female.  HPI  Patient with significant medical history of anxiety, migraines, presents with chief complaint of a headache.  Patient states she has had this headache since Thursday, states it is consistent, describes pain on the right parietal lobe as well as her occipital lobe, she has associated intermittent paresthesias in her upper extremities, denies change in vision, weakness in the upper and lower extremities.  She denies recent head trauma, not on anticoagulant, she denies IV drug use, denies neck pain, or associated fevers or chills.  She states that this headache came on gradually on Thursday and has gotten worse, states its a sharp sensation, states it feels slightly different than her normal migraines.  She states she has been taking her migraine medication without any success.  She has no associated photophobia, increased sensitivity noise, but does states she feels slightly nauseous.  She has no other complaints at this time.  She does not endorse chest pain, shortness of breath, abdominal pain.  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   Initial dx is 05/2013    Hypertension    Migraine    Migraines    Miscarriage 2009   Neck pain    Nondisplaced fracture of fifth metatarsal bone, left foot, subsequent encounter for fracture with  routine healing 08/15/18 09/25/2018   Pruritus 09/29/2018   Pruritus ani 09/25/2018   Rash and nonspecific skin eruption 05/14/2017   Vaginitis    cyctitis    Patient Active Problem List   Diagnosis Date Noted   Acute vaginitis 10/23/2020   Breast pain 09/30/2020   Numbness and tingling of left hand 06/08/2020   Leg swelling 03/18/2020   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   Asthma 10/29/2019   Epistaxis 07/09/2019   Family history of lung cancer    Breast pain, left 02/05/2019   Rash and nonspecific skin eruption 05/14/2017   Chronic migraine 12/06/2016   Annual physical exam 11/11/2015   Obesity (BMI 30.0-34.9) 03/17/2015   Essential hypertension 01/07/2014   Pain in thoracic spine 08/07/2013   Low back pain with radiation 07/30/2013   Vitamin D deficiency 10/27/2010   Allergic rhinitis 12/01/2008   Neck pain 07/04/2007    Past Surgical History:  Procedure Laterality Date   cervical cryotherapy N/A 1999   DILITATION & CURRETTAGE/HYSTROSCOPY WITH THERMACHOICE ABLATION  08/26/2013   Procedure: DILATATION & CURETTAGE/HYSTEROSCOPY WITH THERMACHOICE ENDOMETRIAL ABLATION Procedure #2 Total Therapy Time=min       sec;  Surgeon: Jonnie Kind, MD;  Location: [facility];  Service: Gynecology;;   LAPAROSCOPIC BILATERAL SALPINGECTOMY Bilateral 08/26/2013   Procedure: LAPAROSCOPIC BILATERAL SALPINGECTOMY AND REMOVAL OF LEFT PERITUBAL CYST Procedure #1;  Surgeon: Jonnie Kind, MD;  Location: [facility];  Service:  Gynecology;  Laterality: Bilateral;   LAPAROSCOPIC LYSIS OF ADHESIONS  08/26/2013   Procedure: LAPAROSCOPIC LYSIS OF ADHESIONS Procedure #1;  Surgeon: Jonnie Kind, MD;  Location: [facility];  Service: Gynecology;;   VULVAR LESION REMOVAL  08/26/2013   Procedure: REMOVAL OF VULVAR SEBACEOUS CYST Procedure #3;  Surgeon: Jonnie Kind, MD;  Location: [facility];  Service: Gynecology;;     OB History   No obstetric history on file.     Family History  Problem Relation Age of Onset    Drug abuse Mother    Hypertension Father    Breast cancer Sister        dx 18   Hypertension Maternal Grandmother    Hypertension Maternal Grandfather    Hypertension Paternal Grandmother    Breast cancer Paternal Grandmother        dx late 43s   Colon cancer Cousin 49    Social History   Tobacco Use   Smoking status: Former    Packs/day: 0.00    Years: 1.00    Pack years: 0.00    Types: Cigars, Cigarettes    Quit date: 01/05/2017    Years since quitting: 3.8   Smokeless tobacco: Never  Vaping Use   Vaping Use: Never used  Substance Use Topics   Alcohol use: Yes    Comment: occasional   Drug use: No    Home Medications Prior to Admission medications   Medication Sig Start Date End Date Taking? Authorizing Provider  ALBUTEROL SULFATE HFA IN Inhale 2 puffs into the lungs.    [provider]  amLODipine (NORVASC) 10 MG tablet Take 1 tablet (10 mg total) by mouth daily. 02/11/20   Fayrene Helper, MD  betamethasone dipropionate 0.05 % cream Apply topically 2 (two) times daily. 06/14/20   Fayrene Helper, MD  Erenumab-aooe (AIMOVIG) 70 MG/ML SOAJ Inject 70 mg into the skin every 30 (thirty) days. 08/12/19   Suzzanne Cloud, NP  ergocalciferol (VITAMIN D2) 1.25 MG (50000 UT) capsule Take 1 capsule (50,000 Units total) by mouth once a week. One capsule once weekly 03/22/20   Fayrene Helper, MD  ibuprofen (ADVIL) 400 MG tablet Take 1 tablet (400 mg total) by mouth 3 (three) times daily. 09/30/20   Fayrene Helper, MD  imipramine (TOFRANIL) 25 MG tablet Take 25 mg by mouth at bedtime. 09/26/20   [provider]  metroNIDAZOLE (METROCREAM) 0.75 % cream Apply topically 2 (two) times daily. 10/22/20   [provider]  metroNIDAZOLE (METROGEL VAGINAL) 0.75 % vaginal gel Place 1 Applicatorful vaginally 2 (two) times daily. 08/17/20   Fayrene Helper, MD  montelukast (SINGULAIR) 10 MG tablet Take 1 tablet (10 mg total) by mouth at bedtime. 08/17/20    Fayrene Helper, MD  oxyCODONE-acetaminophen (PERCOCET/ROXICET) 5-325 MG tablet Take 1-2 tablets by mouth 2 (two) times daily as needed.  03/10/20   [provider]  SUMAtriptan (IMITREX) 6 MG/0.5ML SOLN injection Use one injection at onset of migraine.  May repeat in 2 hrs, if needed.  Max dose: 2 inj/day. This is a 30 day prescription. 08/12/19   Suzzanne Cloud, NP  topiramate (TOPAMAX) 100 MG tablet Take 1 tablet (100 mg total) by mouth 2 (two) times daily. 08/12/19   Suzzanne Cloud, NP  triamterene-hydrochlorothiazide (MAXZIDE) 75-50 MG tablet Take 1 tablet by mouth daily. 02/11/20   Fayrene Helper, MD  Vitamin D, Ergocalciferol, (DRISDOL) 1.25 MG (50000 UNIT) CAPS capsule Take 1 capsule (  50,000 Units total) by mouth every 7 (seven) days. 10/22/20   Fayrene Helper, MD  vortioxetine HBr (TRINTELLIX) 5 MG TABS tablet Take 1 tablet (5 mg total) by mouth daily. 10/19/20   [name], MD  fluticasone (FLONASE) 50 MCG/ACT nasal spray Place 2 sprays into both nostrils daily. 04/10/19 07/08/19  Perlie Mayo, NP    Allergies    Diphenhydramine hcl, Orange fruit, and Latex  Review of Systems   Review of Systems  Constitutional:  Negative for chills and fever.  HENT:  Negative for congestion.   Eyes:  Negative for visual disturbance.  Respiratory:  Negative for shortness of breath.   Cardiovascular:  Negative for chest pain.  Gastrointestinal:  Positive for nausea. Negative for abdominal pain and vomiting.  Genitourinary:  Negative for enuresis.  Musculoskeletal:  Negative for back pain.  Skin:  Negative for rash.  Neurological:  Positive for light-headedness, numbness and headaches. Negative for dizziness and weakness.  Hematological:  Does not bruise/bleed easily.   Physical Exam Updated Vital Signs BP 138/67 (BP Location: Left Arm)   Pulse 86   Temp 98.7 F (37.1 C) (Oral)   Resp 18   Ht 5' (1.524 m)   Wt 77.1 kg   LMP 09/29/2020 (Approximate)   SpO2 98%   BMI  33.20 kg/m   Physical Exam Vitals and nursing note reviewed.  Constitutional:      General: She is not in acute distress.    Appearance: She is not ill-appearing.  HENT:     Head: Normocephalic and atraumatic.     Nose: No congestion.  Eyes:     Extraocular Movements: Extraocular movements intact.     Conjunctiva/sclera: Conjunctivae normal.     Pupils: Pupils are equal, round, and reactive to light.  Cardiovascular:     Rate and Rhythm: Normal rate and regular rhythm.     Pulses: Normal pulses.     Heart sounds: No murmur heard.   No friction rub. No gallop.  Pulmonary:     Effort: No respiratory distress.     Breath sounds: No wheezing, rhonchi or rales.  Musculoskeletal:     Right lower leg: No edema.     Left lower leg: No edema.     Comments: Patient has full range of motion, 5 of 5 strength, neurovascular intact in the upper and lower extremities.  Skin:    General: Skin is warm and dry.  Neurological:     Mental Status: She is alert.     GCS: GCS eye subscore is 4. GCS verbal subscore is 5. GCS motor subscore is 6.     Cranial Nerves: No facial asymmetry.     Sensory: Sensation is intact.     Motor: No weakness.     Coordination: Romberg sign negative. Finger-Nose-Finger Test normal.     Gait: Gait is intact.     Comments: Cranial nerves II through XII are grossly intact, no slurring of the words, no difficulty word finding, no unilateral weakness, able to follow commands, able to ambulate without difficulty no gait disturbances.  Psychiatric:        Mood and Affect: Mood normal.    ED Results / Procedures / Treatments   Labs (all labs ordered are listed, but only abnormal results are displayed) Labs Reviewed - No data to display  EKG None  Radiology No results found.  Procedures Procedures   Medications Ordered in ED Medications  sodium chloride 0.9 % bolus 1,000 mL (0  mLs Intravenous Stopped 11/20/20 1446)  metoCLOPramide (REGLAN) injection 10 mg (10  mg Intravenous Given 11/20/20 1404)  ondansetron (ZOFRAN) injection 4 mg (4 mg Intravenous Given 11/20/20 1403)  ketorolac (TORADOL) 15 MG/ML injection 15 mg (15 mg Intravenous Given 11/20/20 1403)    ED Course  I have reviewed the triage vital signs and the nursing notes.  Pertinent labs & imaging results that were available during my care of the patient were reviewed by me and considered in my medical decision making (see chart for details).    MDM Rules/Calculators/A&P                          Initial impression-patient presents with a headache.  She is alert, does not appear in acute stress, vital signs reassuring.  Suspect noncomplex migraine, due to slight difference from normal migraines will obtain CT head, provide patient with migraine cocktail and reassess.  Work-up-CT head was deferred.  Reassessment-reassessed the patient, states she is feeling much better, she would not like to proceed with a head CT, I find this acceptable as headache has completely resolved, no focal deficits, no recent head trauma.  will cancel order at this time.  Patient is ready for discharge.  Rule out-low suspicion for intracranial head bleed as patient denies recent head trauma, not on anticoagulant.  Low suspicion for CVA and/or intracranial head mass there is no focal deficits present on my exam, migraine has resolved after migraine cocktail, after reviewing patient's recent head CT which was 2 months ago it was unremarkable for brain mass.  Low suspicion for meningitis or systemic infection as patient is nontoxic-appearing, vital signs reassuring, no meningeal sign present on my exam.  Low suspicion for dissection as patient has no severe neck pain, pain has resolved with migraine cocktail.   Plan-  Migraine resolved-suspect patient had a migraine, will have her follow-up with her neurologist for further evaluation.  Vital signs have remained stable, no indication for hospital admission.    Patient  given at home care as well strict return precautions.  Patient verbalized that they understood agreed to said plan.  Final Clinical Impression(s) / ED Diagnoses Final diagnoses:  Bad headache    Rx / DC Orders ED Discharge Orders     None        Marcello Fennel, PA-C 11/20/20 1448    Hayden Rasmussen, MD 11/20/20 [phone]

## 2020-11-22 ENCOUNTER — Encounter: Payer: Self-pay | Admitting: Family Medicine

## 2020-11-22 NOTE — Telephone Encounter (Signed)
Appointment scheduled 7.26.2022

## 2020-11-23 ENCOUNTER — Encounter: Payer: Self-pay | Admitting: Nurse Practitioner

## 2020-11-23 ENCOUNTER — Other Ambulatory Visit: Payer: Self-pay

## 2020-11-23 ENCOUNTER — Ambulatory Visit (INDEPENDENT_AMBULATORY_CARE_PROVIDER_SITE_OTHER): Payer: BC Managed Care – PPO | Admitting: Nurse Practitioner

## 2020-11-23 DIAGNOSIS — I10 Essential (primary) hypertension: Secondary | ICD-10-CM

## 2020-11-23 MED ORDER — PROPRANOLOL HCL ER 60 MG PO CP24: 60.0000 mg | ORAL_CAPSULE | Freq: Every day | ORAL | 1 refills | Status: DC

## 2020-11-23 NOTE — Progress Notes (Signed)
Acute Office Visit  Subjective:    Patient ID: Rebecca Boyd, female    DOB: 1976-02-16, 45 y.o.   MRN: [id number]  Chief Complaint  Patient presents with   Hypertension    Follow up    Hypertension  Patient is in today for BP check. She states her BP is up and down. She is getting BP 138/97 and 167/98 at the emergency room when she was there for a headache.  Her home BP was 145/101 this morning when she got off work. 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   Initial dx is 05/2013    Hypertension    Migraine    Migraines    Miscarriage 2009   Neck pain    Nondisplaced fracture of fifth metatarsal bone, left foot, subsequent encounter for fracture with routine healing 08/15/18 09/25/2018   Pruritus 09/29/2018   Pruritus ani 09/25/2018   Rash and nonspecific skin eruption 05/14/2017   Vaginitis    cyctitis    Past Surgical History:  Procedure Laterality Date   cervical cryotherapy N/A 1999   DILITATION & CURRETTAGE/HYSTROSCOPY WITH THERMACHOICE ABLATION  08/26/2013   Procedure: DILATATION & CURETTAGE/HYSTEROSCOPY WITH THERMACHOICE ENDOMETRIAL ABLATION Procedure #2 Total Therapy Time=min       sec;  Surgeon: Jonnie Kind, MD;  Location: [facility];  Service: Gynecology;;   LAPAROSCOPIC BILATERAL SALPINGECTOMY Bilateral 08/26/2013   Procedure: LAPAROSCOPIC BILATERAL SALPINGECTOMY AND REMOVAL OF LEFT PERITUBAL CYST Procedure #1;  Surgeon: Jonnie Kind, MD;  Location: [facility];  Service: Gynecology;  Laterality: Bilateral;   LAPAROSCOPIC LYSIS OF ADHESIONS  08/26/2013   Procedure: LAPAROSCOPIC LYSIS OF ADHESIONS Procedure #1;  Surgeon: Jonnie Kind, MD;  Location: [facility];  Service: Gynecology;;   VULVAR LESION REMOVAL  08/26/2013   Procedure: REMOVAL OF VULVAR SEBACEOUS CYST Procedure #3;  Surgeon: Jonnie Kind, MD;  Location: [facility];  Service: Gynecology;;    Family History  Problem Relation Age of Onset   Drug abuse Mother    Hypertension Father    Breast cancer Sister        dx 90   Hypertension Maternal Grandmother    Hypertension Maternal Grandfather    Hypertension Paternal Grandmother    Breast cancer Paternal Grandmother        dx late 40s   Colon cancer Cousin 58    Social History   Socioeconomic History   Marital status: Single    Spouse name: Not on file   Number of children: Not on file   Years of education: Not on file   Highest education level: Not on file  Occupational History   Not on file  Tobacco Use   Smoking status: Former    Packs/day: 0.00    Years: 1.00    Pack years: 0.00    Types: Cigars, Cigarettes    Quit date: 01/05/2017  Years since quitting: 3.8   Smokeless tobacco: Never  Vaping Use   Vaping Use: Never used  Substance and Sexual Activity   Alcohol use: Yes    Comment: occasional   Drug use: No   Sexual activity: Yes    Birth control/protection: None  Other Topics Concern   Not on file  Social History Narrative   Lives home with mother.  Works at [employer].  Education 10th grade/GED.  No children.  Single.     Social Determinants of Health   Financial Resource Strain: Not on file  Food Insecurity: Not on file  Transportation Needs: Not on file  Physical Activity: Not on file  Stress: Not on file  Social Connections: Not on file  Intimate Partner Violence: Not on file    Outpatient Medications Prior to Visit  Medication Sig Dispense Refill   ALBUTEROL SULFATE HFA IN Inhale 2  puffs into the lungs.     amLODipine (NORVASC) 10 MG tablet Take 1 tablet (10 mg total) by mouth daily. 90 tablet 3   betamethasone dipropionate 0.05 % cream Apply topically 2 (two) times daily. 45 g 0   Erenumab-aooe (AIMOVIG) 70 MG/ML SOAJ Inject 70 mg into the skin every 30 (thirty) days. 1 pen 11   ergocalciferol (VITAMIN D2) 1.25 MG (50000 UT) capsule Take 1 capsule (50,000 Units total) by mouth once a week. One capsule once weekly 12 capsule 2   ibuprofen (ADVIL) 400 MG tablet Take 1 tablet (400 mg total) by mouth 3 (three) times daily. 15 tablet 0   imipramine (TOFRANIL) 25 MG tablet Take 25 mg by mouth at bedtime.     metroNIDAZOLE (METROCREAM) 0.75 % cream Apply topically 2 (two) times daily.     metroNIDAZOLE (METROGEL VAGINAL) 0.75 % vaginal gel Place 1 Applicatorful vaginally 2 (two) times daily. 70 g 2   montelukast (SINGULAIR) 10 MG tablet Take 1 tablet (10 mg total) by mouth at bedtime. 30 tablet 3   oxyCODONE-acetaminophen (PERCOCET/ROXICET) 5-325 MG tablet Take 1-2 tablets by mouth 2 (two) times daily as needed.      SUMAtriptan (IMITREX) 6 MG/0.5ML SOLN injection Use one injection at onset of migraine.  May repeat in 2 hrs, if needed.  Max dose: 2 inj/day. This is a 30 day prescription. 5 mL 11   topiramate (TOPAMAX) 100 MG tablet Take 1 tablet (100 mg total) by mouth 2 (two) times daily. 180 tablet 4   triamterene-hydrochlorothiazide (MAXZIDE) 75-50 MG tablet Take 1 tablet by mouth daily. 30 tablet 4   Vitamin D, Ergocalciferol, (DRISDOL) 1.25 MG (50000 UNIT) CAPS capsule Take 1 capsule (50,000 Units total) by mouth every 7 (seven) days. 12 capsule 3   vortioxetine HBr (TRINTELLIX) 5 MG TABS tablet Take 1 tablet (5 mg total) by mouth daily. 30 tablet 1   No facility-administered medications prior to visit.    Allergies  Allergen Reactions   Diphenhydramine Hcl Anaphylaxis    (Benadryl)   Orange Fruit Hives, Shortness Of Breath, Itching and Other (See Comments)    MAKES IT  DIFFICULT TO BREATH   Latex Rash    Review of Systems  Constitutional: Negative.   Respiratory: Negative.    Cardiovascular: Negative.   Psychiatric/Behavioral: Negative.        Objective:    Physical Exam Constitutional:      Appearance: Normal appearance.  Cardiovascular:     Rate and Rhythm: Normal rate and regular rhythm.     Pulses: Normal pulses.  Heart sounds: Normal heart sounds.  Pulmonary:     Effort: Pulmonary effort is normal.     Breath sounds: Normal breath sounds.  Neurological:     Mental Status: She is alert.  Psychiatric:        Mood and Affect: Mood normal.        Behavior: Behavior normal.        Thought Content: Thought content normal.        Judgment: Judgment normal.    BP 133/88 (BP Location: Right Arm, Patient Position: Sitting, Cuff Size: Large)   Pulse 78   Temp (!) 97 F (36.1 C) (Temporal)   Ht 5' (1.524 m)   Wt 184 lb (83.5 kg)   LMP 11/23/2020 (Exact Date)   SpO2 96%   BMI 35.94 kg/m  Wt Readings from Last 3 Encounters:  11/23/20 184 lb (83.5 kg)  11/20/20 170 lb (77.1 kg)  10/21/20 181 lb (82.1 kg)    There are no preventive care reminders to display for this patient.   There are no preventive care reminders to display for this patient.   Lab Results  Component Value Date   TSH 1.140 10/21/2020   Lab Results  Component Value Date   WBC 5.2 09/08/2020   HGB 13.7 09/08/2020   HCT 40.7 09/08/2020   MCV 88.5 09/08/2020   PLT 221 09/08/2020   Lab Results  Component Value Date   NA 140 10/21/2020   K 3.7 10/21/2020   CO2 21 10/21/2020   GLUCOSE 102 (H) 10/21/2020   BUN 7 10/21/2020   CREATININE 0.69 10/21/2020   BILITOT 0.2 10/21/2020   ALKPHOS 107 10/21/2020   AST 17 10/21/2020   ALT 15 10/21/2020   PROT 7.1 10/21/2020   ALBUMIN 4.7 10/21/2020   CALCIUM 9.8 10/21/2020   ANIONGAP 6 09/08/2020   EGFR 110 10/21/2020   Lab Results  Component Value Date   CHOL 188 10/21/2020   Lab Results  Component  Value Date   HDL 52 10/21/2020   Lab Results  Component Value Date   LDLCALC 122 (H) 10/21/2020   Lab Results  Component Value Date   TRIG 75 10/21/2020   Lab Results  Component Value Date   CHOLHDL 3.6 10/21/2020   Lab Results  Component Value Date   HGBA1C 5.3 03/18/2020       Assessment & Plan:   Problem List Items Addressed This Visit       Cardiovascular and Mediastinum   Essential hypertension    BP Readings from Last 3 Encounters:  11/23/20 133/88  11/20/20 138/67  10/21/20 130/84  -she states her home readings have been in the 140s/100s -pain from headaches may be  Interfering with BP accuracy -Rx. propranolol      Relevant Medications   propranolol ER (INDERAL LA) 60 MG 24 hr capsule     Meds ordered this encounter  Medications   propranolol ER (INDERAL LA) 60 MG 24 hr capsule    Sig: Take 1 capsule (60 mg total) by mouth daily.    Dispense:  30 capsule    Refill:  [place], NP

## 2020-11-23 NOTE — Assessment & Plan Note (Signed)
BP Readings from Last 3 Encounters:  11/23/20 133/88  11/20/20 138/67  10/21/20 130/84   -she states her home readings have been in the 140s/100s -pain from headaches may be  Interfering with BP accuracy -Rx. propranolol

## 2020-12-13 NOTE — Progress Notes (Signed)
Virtual Visit via Video Note  I connected with Rebecca Boyd on 12/14/20 at  1:20 PM EDT by a video enabled telemedicine application and verified that I am speaking with the correct person using two identifiers.  Location: Patient: home Provider: office Persons participated in the visit- patient, provider    I discussed the limitations of evaluation and management by telemedicine and the availability of in person appointments. The patient expressed understanding and agreed to proceed.    I discussed the assessment and treatment plan with the patient. The patient was provided an opportunity to ask questions and all were answered. The patient agreed with the plan and demonstrated an understanding of the instructions.   The patient was advised to call back or seek an in-person evaluation if the symptoms worsen or if the condition fails to improve as anticipated.  I provided 12 minutes of non-face-to-face time during this encounter.   [name], MD     [facility] MD/PA/NP OP Progress Note  12/14/2020 1:41 PM Rebecca Boyd  MRN:  [id number]  Chief Complaint:  Chief Complaint   Follow-up; Anxiety    HPI:  This is a follow-up appointment for depression and anxiety.   She states that she does not like her new medication as she feels fidgety.  She wants to be back on Xanax as it was working well for her.  She was provided psycho education of its potential risk of Xanax, although she states that she did not have any issues with the medication.  When she is asked about the relationship with her family, she states that "it is going."  She continues to work on third shift, and works on every other weekend.  She has depressive symptoms as in PHQ-9.  She denies SI.  Although she denies panic attacks, she has been noticing feeling more anxious without any reason since being on Trintellix.  She is interested in seeing a therapist.  She agrees to hold any medication at this time.    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 GAD (generalized anxiety disorder)  F41.1     2. MDD (major depressive disorder), recurrent episode, mild ([place])  F33.0       Past Psychiatric History: Please see initial evaluation for full details. I have reviewed the history. No updates at this time.     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   Initial dx is 05/2013    Hypertension    Migraine    Migraines    Miscarriage  2009   Neck pain    Nondisplaced fracture of fifth metatarsal bone, left foot, subsequent encounter for fracture with routine healing 08/15/18 09/25/2018   Pruritus 09/29/2018   Pruritus ani 09/25/2018   Rash and nonspecific skin eruption 05/14/2017   Vaginitis    cyctitis    Past Surgical History:  Procedure Laterality Date   cervical cryotherapy N/A 1999   DILITATION & CURRETTAGE/HYSTROSCOPY WITH THERMACHOICE ABLATION  08/26/2013   Procedure: DILATATION & CURETTAGE/HYSTEROSCOPY WITH THERMACHOICE ENDOMETRIAL ABLATION Procedure #2 Total Therapy Time=min       sec;  Surgeon: Jonnie Kind, MD;  Location: [facility];  Service: Gynecology;;    LAPAROSCOPIC BILATERAL SALPINGECTOMY Bilateral 08/26/2013   Procedure: LAPAROSCOPIC BILATERAL SALPINGECTOMY AND REMOVAL OF LEFT PERITUBAL CYST Procedure #1;  Surgeon: Jonnie Kind, MD;  Location: [facility];  Service: Gynecology;  Laterality: Bilateral;   LAPAROSCOPIC LYSIS OF ADHESIONS  08/26/2013   Procedure: LAPAROSCOPIC LYSIS OF ADHESIONS Procedure #1;  Surgeon: Jonnie Kind, MD;  Location: [facility];  Service: Gynecology;;   VULVAR LESION REMOVAL  08/26/2013   Procedure: REMOVAL OF VULVAR SEBACEOUS CYST Procedure #3;  Surgeon: Jonnie Kind, MD;  Location: [facility];  Service: Gynecology;;    Family Psychiatric History: Please see initial evaluation for full details. I have reviewed the history. No updates at this time.     Family History:  Family History  Problem Relation Age of Onset   Drug abuse Mother    Hypertension Father    Breast cancer Sister        dx 58   Hypertension Maternal Grandmother    Hypertension Maternal Grandfather    Hypertension Paternal Grandmother    Breast cancer Paternal Grandmother        dx late 94s   Colon cancer Cousin 52    Social History:  Social History   Socioeconomic History   Marital status: Single    Spouse name: Not on file   Number of children: Not on file   Years of education: Not on file   Highest education level: Not on file  Occupational History   Not on file  Tobacco Use   Smoking status: Former    Packs/day: 0.00    Years: 1.00    Pack years: 0.00    Types: Cigars, Cigarettes    Quit date: 01/05/2017    Years since quitting: 3.9   Smokeless tobacco: Never  Vaping Use   Vaping Use: Never used  Substance and Sexual Activity   Alcohol use: Yes    Comment: occasional   Drug use: No   Sexual activity: Yes    Birth control/protection: None  Other Topics Concern   Not on file  Social History Narrative   Lives home with mother.  Works at [employer].  Education 10th grade/GED.  No children.  Single.     Social  Determinants of Health   Financial Resource Strain: Not on file  Food Insecurity: Not on file  Transportation Needs: Not on file  Physical Activity: Not on file  Stress: Not on file  Social Connections: Not on file    Allergies:  Allergies  Allergen Reactions   Diphenhydramine Hcl Anaphylaxis    (Benadryl)   Orange Fruit Hives, Shortness Of Breath, Itching and Other (See Comments)    MAKES IT DIFFICULT TO BREATH   Latex Rash    Metabolic Disorder Labs: Lab Results  Component Value Date   HGBA1C 5.3 03/18/2020   MPG 103 06/10/2018  MPG 108 05/29/2013   No results found for: PROLACTIN Lab Results  Component Value Date   CHOL 188 10/21/2020   TRIG 75 10/21/2020   HDL 52 10/21/2020   CHOLHDL 3.6 10/21/2020   VLDL 19 05/10/2016   LDLCALC 122 (H) 10/21/2020   LDLCALC 133 (H) 03/18/2020   Lab Results  Component Value Date   TSH 1.140 10/21/2020   TSH 0.951 03/18/2020    Therapeutic Level Labs: No results found for: LITHIUM No results found for: VALPROATE No components found for:  CBMZ  Current Medications: Current Outpatient Medications  Medication Sig Dispense Refill   ALBUTEROL SULFATE HFA IN Inhale 2 puffs into the lungs.     amLODipine (NORVASC) 10 MG tablet Take 1 tablet (10 mg total) by mouth daily. 90 tablet 3   betamethasone dipropionate 0.05 % cream Apply topically 2 (two) times daily. 45 g 0   Erenumab-aooe (AIMOVIG) 70 MG/ML SOAJ Inject 70 mg into the skin every 30 (thirty) days. 1 pen 11   ergocalciferol (VITAMIN D2) 1.25 MG (50000 UT) capsule Take 1 capsule (50,000 Units total) by mouth once a week. One capsule once weekly 12 capsule 2   ibuprofen (ADVIL) 400 MG tablet Take 1 tablet (400 mg total) by mouth 3 (three) times daily. 15 tablet 0   imipramine (TOFRANIL) 25 MG tablet Take 25 mg by mouth at bedtime.     metroNIDAZOLE (METROCREAM) 0.75 % cream Apply topically 2 (two) times daily.     metroNIDAZOLE (METROGEL VAGINAL) 0.75 % vaginal gel Place 1  Applicatorful vaginally 2 (two) times daily. 70 g 2   montelukast (SINGULAIR) 10 MG tablet Take 1 tablet (10 mg total) by mouth at bedtime. 30 tablet 3   oxyCODONE-acetaminophen (PERCOCET/ROXICET) 5-325 MG tablet Take 1-2 tablets by mouth 2 (two) times daily as needed.      propranolol ER (INDERAL LA) 60 MG 24 hr capsule Take 1 capsule (60 mg total) by mouth daily. 30 capsule 1   SUMAtriptan (IMITREX) 6 MG/0.5ML SOLN injection Use one injection at onset of migraine.  May repeat in 2 hrs, if needed.  Max dose: 2 inj/day. This is a 30 day prescription. 5 mL 11   topiramate (TOPAMAX) 100 MG tablet Take 1 tablet (100 mg total) by mouth 2 (two) times daily. 180 tablet 4   triamterene-hydrochlorothiazide (MAXZIDE) 75-50 MG tablet Take 1 tablet by mouth daily. 30 tablet 4   Vitamin D, Ergocalciferol, (DRISDOL) 1.25 MG (50000 UNIT) CAPS capsule Take 1 capsule (50,000 Units total) by mouth every 7 (seven) days. 12 capsule 3   No current facility-administered medications for this visit.     Musculoskeletal: Strength & Muscle Tone:  N/A Gait & Station:  N/A Patient leans: N/A  Psychiatric Specialty Exam: Review of Systems  Psychiatric/Behavioral:  Positive for dysphoric mood and sleep disturbance. Negative for agitation, behavioral problems, confusion, decreased concentration, hallucinations, self-injury and suicidal ideas. The patient is nervous/anxious. The patient is not hyperactive.   All other systems reviewed and are negative.  Last menstrual period 11/23/2020.There is no height or weight on file to calculate BMI.  General Appearance: Fairly Groomed  Eye Contact:  Good  Speech:  Clear and Coherent  Volume:  Normal  Mood:   same  Affect:  Appropriate, Congruent, and euthymic  Thought Process:  Coherent  Orientation:  Full (Time, Place, and Person)  Thought Content: Logical   Suicidal Thoughts:  No  Homicidal Thoughts:  No  Memory:  Immediate;   Good  Judgement:  Good  Insight:  Fair   Psychomotor Activity:  Normal  Concentration:  Concentration: Good and Attention Span: Good  Recall:  Good  Fund of Knowledge: Good  Language: Good  Akathisia:  No  Handed:  Right  AIMS (if indicated): not done  Assets:  Communication Skills Desire for Improvement  ADL's:  Intact  Cognition: WNL  Sleep:  Fair   Screenings: GAD-7    Flowsheet Row Office Visit from 12/10/2019 in [facility] Office Visit from 10/28/2019 in [facility] Office Visit from 09/25/2019 in [facility] Office Visit from 05/21/2019 in [facility]  Total GAD-7 Score '14 15 6 18      '$ PHQ2-9    Flowsheet Row Video Visit from 12/14/2020 in [place] Visit from 11/23/2020 in [place] Visit from 10/21/2020 in [place] Visit from 09/30/2020 in [place] Visit from 09/13/2020 in [facility]  PHQ-2 Total Score '2 5 6 1 2  '$ PHQ-9 Total Score '7 9 13 6 7      '$ Flowsheet Row Video Visit from 12/14/2020 in [place] [facility] from 11/20/2020 in [place] Video Visit from 10/19/2020 in [place] No Risk No Risk No Risk        Assessment and Plan:  Rebecca Boyd is a 45 y.o. year old female with a history of Depressive Disorder NOS and Psychotic Disorder NOS, hypertension, migraine, who presents for follow up appointment for below.  \  1. GAD (generalized anxiety disorder) 2. MDD (major depressive disorder), recurrent episode, mild ([place]) She continues to report occasional depressive symptoms and anxiety without significant triggers. Psychosocial stressors includes loss of her maternal uncle, taking care of her mother with COPD, and her sister with breast cancer.  She reports worsening in anxiety since starting Trintellix.  Will discontinue this medication given adverse  reaction.  Noted that she has had several other trials of medication, which caused adverse reaction.  Although the treatment option of TMS was discussed, she is not interested in this at this time.  She is now open to see a therapist; will make referral.  Will hold pharmacological treatment at this time.   # obstructive sleep apnea # fatigue Unchanged. Reviewed sleep study on 03/2020. AHI 6.9. Recommended weight loss, CPAP, oral surgery or appliance.  She has been followed by her clinic, and is awaiting to get CPAP machine.    Plan Discontinue Trintellix Referral to therapy  Next appointment 11/15 at 1:40 for 20 minutes, video - TSH was reportedly checked in late 2021; wnl   Past trials of medication: sertraline, fluoxetine (pruritis), lexapro (weight gain), venlafaxine (leg edema), duloxetine (weakness), quetiapine, Xanax   The patient demonstrates the following risk factors for suicide: Chronic risk factors for suicide include: psychiatric disorder of depression. Acute risk factors for suicide include: loss (financial, interpersonal, professional). Protective factors for this patient include: coping skills and hope for the future. Considering these factors, the overall suicide risk at this point appears to be low. Patient is appropriate for outpatient follow up.         [name], MD 12/14/2020, 1:41 PM

## 2020-12-14 ENCOUNTER — Telehealth (INDEPENDENT_AMBULATORY_CARE_PROVIDER_SITE_OTHER): Payer: BC Managed Care – PPO | Admitting: Psychiatry

## 2020-12-14 ENCOUNTER — Other Ambulatory Visit: Payer: Self-pay

## 2020-12-14 ENCOUNTER — Encounter: Payer: Self-pay | Admitting: Psychiatry

## 2020-12-14 DIAGNOSIS — F330 Major depressive disorder, recurrent, mild: Secondary | ICD-10-CM

## 2020-12-14 DIAGNOSIS — F411 Generalized anxiety disorder: Secondary | ICD-10-CM | POA: Diagnosis not present

## 2020-12-14 NOTE — Patient Instructions (Signed)
Discontinue Trintellix Referral to therapy  Next appointment 11/15 at 1:40

## 2020-12-16 DIAGNOSIS — Z79891 Long term (current) use of opiate analgesic: Secondary | ICD-10-CM | POA: Diagnosis not present

## 2020-12-16 DIAGNOSIS — M542 Cervicalgia: Secondary | ICD-10-CM | POA: Diagnosis not present

## 2020-12-16 DIAGNOSIS — I10 Essential (primary) hypertension: Secondary | ICD-10-CM | POA: Diagnosis not present

## 2020-12-16 DIAGNOSIS — M545 Low back pain, unspecified: Secondary | ICD-10-CM | POA: Diagnosis not present

## 2020-12-16 DIAGNOSIS — M546 Pain in thoracic spine: Secondary | ICD-10-CM | POA: Diagnosis not present

## 2020-12-20 ENCOUNTER — Telehealth: Payer: Self-pay | Admitting: Orthopedic Surgery

## 2020-12-20 ENCOUNTER — Encounter: Payer: Self-pay | Admitting: Orthopedic Surgery

## 2020-12-20 NOTE — Telephone Encounter (Signed)
She is asking for no use of pressure hose no lifting more than 5 lbs  Ok to provide note? Will be open ended since she has not scheduled surgery

## 2020-12-20 NOTE — Telephone Encounter (Signed)
Patient is requesting a note for light duty until Dr. Aline Brochure does her surgery.   Please call her back and talk with her.

## 2020-12-24 ENCOUNTER — Encounter: Payer: Self-pay | Admitting: Family Medicine

## 2020-12-24 ENCOUNTER — Ambulatory Visit (INDEPENDENT_AMBULATORY_CARE_PROVIDER_SITE_OTHER): Payer: BC Managed Care – PPO | Admitting: Family Medicine

## 2020-12-24 ENCOUNTER — Other Ambulatory Visit: Payer: Self-pay

## 2020-12-24 VITALS — BP 125/83 | HR 78 | Resp 16 | Ht 60.0 in | Wt 184.0 lb

## 2020-12-24 DIAGNOSIS — I10 Essential (primary) hypertension: Secondary | ICD-10-CM

## 2020-12-24 DIAGNOSIS — E669 Obesity, unspecified: Secondary | ICD-10-CM

## 2020-12-24 DIAGNOSIS — M545 Low back pain, unspecified: Secondary | ICD-10-CM

## 2020-12-24 DIAGNOSIS — Z23 Encounter for immunization: Secondary | ICD-10-CM | POA: Diagnosis not present

## 2020-12-24 DIAGNOSIS — R21 Rash and other nonspecific skin eruption: Secondary | ICD-10-CM

## 2020-12-24 NOTE — Patient Instructions (Signed)
F/u in 4 months, call if you need me before  Blood pressure is good   Reduce salt, increase vegetable and fruit , fresh or frozen    No medication changes  Start yoga for anxiety regularly, therapy sessions will definitely help and 30 minutes every day for physical exercise will all help with stress and anxiety management  Thanks for choosing [facility], we consider it a privelige to serve you.

## 2020-12-25 ENCOUNTER — Encounter: Payer: Self-pay | Admitting: Family Medicine

## 2020-12-25 NOTE — Assessment & Plan Note (Signed)
chronic pain managed by pain clinic, adequately controlled

## 2020-12-25 NOTE — Assessment & Plan Note (Signed)
Controlled, no change in medication  

## 2020-12-25 NOTE — Assessment & Plan Note (Signed)
  Patient re-educated about  the importance of commitment to a  minimum of 150 minutes of exercise per week as able.  The importance of healthy food choices with portion control discussed, as well as eating regularly and within a 12 hour window most days. The need to choose "clean , green" food 50 to 75% of the time is discussed, as well as to make water the primary drink and set a goal of 64 ounces water daily.    Weight /BMI 12/24/2020 11/23/2020 11/20/2020  WEIGHT 184 lb 184 lb 170 lb  HEIGHT '5\' 0"'$  '5\' 0"'$  '5\' 0"'$   BMI 35.94 kg/m2 35.94 kg/m2 33.2 kg/m2  Some encounter information is confidential and restricted. Go to Review Flowsheets activity to see all data.

## 2020-12-25 NOTE — Assessment & Plan Note (Signed)
improved

## 2020-12-25 NOTE — Progress Notes (Signed)
   Rebecca Boyd     MRN: [id number]      DOB: 04/26/1976   HPI Rebecca Boyd is here for follow up and re-evaluation of chronic medical conditions, medication management and review of any available recent lab and radiology data.  Preventive health is updated, specifically  Cancer screening and Immunization.   Questions or concerns regarding consultations or procedures which the PT has had in the interim are  addressed. The PT denies any adverse reactions to current medications since the last visit.  There are no new concerns.  C/o anxiety and insomnia, managed by Psych  ROS Denies recent fever or chills. Denies sinus pressure, nasal congestion, ear pain or sore throat. Denies chest congestion, productive cough or wheezing. Denies chest pains, palpitations and leg swelling Denies abdominal pain, nausea, vomiting,diarrhea or constipation.   Denies dysuria, frequency, hesitancy or incontinence. Denies uncontrolled  joint pain, swelling and limitation in mobility. Denies headaches, seizures, numbness, or tingling. Denies skin break down reports improved  rash.   PE  BP 125/83   Pulse 78   Resp 16   Ht 5' (1.524 m)   Wt 184 lb (83.5 kg)   SpO2 98%   BMI 35.94 kg/m   Patient alert and oriented and in no cardiopulmonary distress.  HEENT: No facial asymmetry, EOMI,     Neck supple .  Chest: Clear to auscultation bilaterally.  CVS: S1, S2 no murmurs, no S3.Regular rate.  ABD: Soft non tender.   Ext: No edema  MS: Adequate ROM spine, shoulders, hips and knees.  Skin: Intact, no ulcerations or rash noted.  Psych: Good eye contact, normal affect. Memory intact not anxious or depressed appearing.  CNS: CN 2-12 intact, power,  normal throughout.no focal deficits noted.   Assessment & Plan  Chronic migraine Controlled, no change in medication   Essential hypertension Controlled, no change in medication DASH diet and commitment to daily physical activity for a minimum  of 30 minutes discussed and encouraged, as a part of hypertension management. The importance of attaining a healthy weight is also discussed.  BP/Weight 12/24/2020 11/23/2020 11/20/2020 10/21/2020 09/30/2020 09/13/2020 [id number]  Systolic BP 0000000 Q000111Q 0000000 [id number]  Diastolic BP 83 88 67 84 82 90 77  Wt. (Lbs) 184 184 170 181 183 182.8 175  BMI 35.94 35.94 33.2 35.35 35.74 35.7 34.18  Some encounter information is confidential and restricted. Go to Review Flowsheets activity to see all data.       Low back pain with radiation chronic pain managed by pain clinic, adequately controlled  Obesity (BMI 30.0-34.9)  Patient re-educated about  the importance of commitment to a  minimum of 150 minutes of exercise per week as able.  The importance of healthy food choices with portion control discussed, as well as eating regularly and within a 12 hour window most days. The need to choose "clean , green" food 50 to 75% of the time is discussed, as well as to make water the primary drink and set a goal of 64 ounces water daily.    Weight /BMI 12/24/2020 11/23/2020 11/20/2020  WEIGHT 184 lb 184 lb 170 lb  HEIGHT '5\' 0"'$  '5\' 0"'$  '5\' 0"'$   BMI 35.94 kg/m2 35.94 kg/m2 33.2 kg/m2  Some encounter information is confidential and restricted. Go to Review Flowsheets activity to see all data.      Rash and nonspecific skin eruption improved

## 2020-12-25 NOTE — Assessment & Plan Note (Signed)
Controlled, no change in medication DASH diet and commitment to daily physical activity for a minimum of 30 minutes discussed and encouraged, as a part of hypertension management. The importance of attaining a healthy weight is also discussed.  BP/Weight 12/24/2020 11/23/2020 11/20/2020 10/21/2020 09/30/2020 09/13/2020 [id number]  Systolic BP 0000000 Q000111Q 0000000 [id number] [id number]  Diastolic BP 83 88 67 84 82 90 77  Wt. (Lbs) 184 184 170 181 183 182.8 175  BMI 35.94 35.94 33.2 35.35 35.74 35.7 34.18  Some encounter information is confidential and restricted. Go to Review Flowsheets activity to see all data.

## 2020-12-27 DIAGNOSIS — L308 Other specified dermatitis: Secondary | ICD-10-CM | POA: Diagnosis not present

## 2020-12-27 DIAGNOSIS — B078 Other viral warts: Secondary | ICD-10-CM | POA: Diagnosis not present

## 2020-12-27 DIAGNOSIS — D485 Neoplasm of uncertain behavior of skin: Secondary | ICD-10-CM | POA: Diagnosis not present

## 2020-12-28 ENCOUNTER — Encounter (HOSPITAL_COMMUNITY): Payer: Self-pay | Admitting: Psychiatry

## 2020-12-28 ENCOUNTER — Ambulatory Visit (INDEPENDENT_AMBULATORY_CARE_PROVIDER_SITE_OTHER): Payer: BC Managed Care – PPO | Admitting: Psychiatry

## 2020-12-28 ENCOUNTER — Other Ambulatory Visit: Payer: Self-pay

## 2020-12-28 DIAGNOSIS — F411 Generalized anxiety disorder: Secondary | ICD-10-CM | POA: Diagnosis not present

## 2020-12-28 NOTE — Progress Notes (Signed)
Virtual Visit via Video Note  I connected with Rebecca Boyd on 12/28/20 at 11:00 AM EDT by a video enabled telemedicine application and verified that I am speaking with the correct person using two identifiers.  Location: Patient: Car Provider:  [place] office    I discussed the limitations of evaluation and management by telemedicine and the availability of in person appointments. The patient expressed understanding and agreed to proceed.   I provided 60 minutes of non-face-to-face time during this encounter.   Rebecca Smoker, LCSW     Comprehensive Clinical Assessment (CCA) Note  12/28/2020 Rebecca Boyd [id number]  Chief Complaint:  Chief Complaint  Patient presents with   Stress   Other    Irritability   Visit Diagnosis:GAD     R/O Bipolar Disorder             CCA Biopsychosocial Intake/Chief Complaint:  " I have very anxiety, mainly happens when I am around certain family members (parents, siblings_  Current Symptoms/Problems: yelling, screaming, social withdrawal, has anger outbursts with no recollection   Patient Reported Schizophrenia/Schizoaffective Diagnosis in Past: No   Strengths: good listener, I am there for others, good worker, creative  Preferences: Individual therapy  Abilities: cook, hair stylist   Type of Services Patient Feels are Needed: Individual therapy/ learn how to control the anxiety and depression   Initial Clinical Notes/Concerns: Pt is referred for services by psychiatrist Dr. Modesta Messing due to pt experiencing symptoms of depression and anxiety. She reports no psychiatric hospitalizations. Pt is a returning patient to this clinician as she was seen briefly in 2015.   Mental Health Symptoms Depression:   Fatigue; Hopelessness; Increase/decrease in appetite; Irritability; Weight gain/loss; Tearfulness; Worthlessness   Duration of Depressive symptoms:  Greater than two weeks   Mania:   Irritability;  Increased Energy; Recklessness (spending sprees)   Anxiety:    Difficulty concentrating; Irritability; Fatigue; Tension; Worrying   Psychosis:   Hallucinations (visual - sees images, auditory -chatter, indistinct conversation, denies command hallucinations)   Duration of Psychotic symptoms:  Greater than six months   Trauma:   None   Obsessions:   None   Compulsions:   None   Inattention:   None   Hyperactivity/Impulsivity:   None   Oppositional/Defiant Behaviors:   None   Emotional Irregularity:   None   Other Mood/Personality Symptoms:  No data recorded   Mental Status Exam Appearance and self-care  Stature:  No data recorded  Weight:   Average weight   Clothing:   Casual   Grooming:   Normal   Cosmetic use:   None   Posture/gait:  No data recorded  Motor activity:   Not Remarkable   Sensorium  Attention:   Normal   Concentration:   Normal   Orientation:   X5   Recall/memory:   Normal   Affect and Mood  Affect:   Anxious   Mood:   Anxious   Relating  Eye contact:  No data recorded  Facial expression:   Responsive   Attitude toward examiner:   Cooperative   Thought and Language  Speech flow:  Normal   Thought content:   Appropriate to Mood and Circumstances   Preoccupation:   Ruminations   Hallucinations:   Auditory; Visual   Organization:  No data recorded  [employer] of Knowledge:   Average   Intelligence:   Average   Abstraction:   Normal   Judgement:   Fair  Reality Testing:   Realistic   Insight:   Gaps   Decision Making:   Normal   Social Functioning  Social Maturity:   Responsible   Social Judgement:   Normal   Stress  Stressors:   Family conflict; Work   Coping Ability:   Programme researcher, broadcasting/film/video Deficits:  No data recorded  Supports:   Family     Religion: Religion/Spirituality Are You A Religious Person?: Yes What is Your Religious Affiliation?:  [employer]ist  Leisure/Recreation: Leisure / [place]?: Yes Leisure and Hobbies: shopping, go out to eat with friends,  Exercise/Diet: Exercise/Diet Do You Exercise?: Yes What Type of Exercise Do You Do?: Run/Walk How Many Times a Week Do You Exercise?: 1-3 times a week Have You Gained or Lost A Significant Amount of Weight in the Past Six Months?: Yes-Gained Number of Pounds Gained: 30 Do You Follow a Special Diet?: No Do You Have Any Trouble Sleeping?: No   CCA Employment/Education Employment/Work Situation: Employment / Work Situation Employment Situation: Employed Where is Patient Currently Employed?: [employer] How Long has Patient Been Employed?: 1 year Are You Satisfied With Your Job?: No Do You Work More Than One Job?: No Work Stressors: stressful regarding coworkers - gossip and drama Patient's Job has Been Impacted by Current Illness: Yes Describe how Patient's Job has Been Impacted: becomes irritable and anxious on job at times What is the Longest Time Patient has Held a Job?: 6 years Where was the Patient Employed at that Time?: [employer] Has Patient ever Been in the [employer]?: No  Education: Education Last Grade Completed: 10 Did Teacher, adult education From [employer]?: No (obtained GED) Did You Attend College?: Yes (Attended [employer], obtained cosmetology license, EKG technician certification, Associate's in medical administration) What Type of College Degree Do you Have?: Associates' Degree in [place] Did You Have Any Special Interests In School?: none Did You Have An Individualized Education Program (IIEP): No Did You Have Any Difficulty At School?: No   CCA Family/Childhood History Family and Relationship History: Family history Marital status: Single (Pt resides in [place] with her mother and sister.) Are you sexually active?: Yes What is your sexual orientation?: heterosexual Does patient have children?: No  Childhood  History:  Childhood History By whom was/is the patient raised?: Other (Comment) Engineer, petroleum) Additional childhood history information: Pt was born and reared in [place] Description of patient's relationship with caregiver when they were a child: real close Patient's description of current relationship with people who raised him/her: deceased How were you disciplined when you got in trouble as a child/adolescent?: whippings, Does patient have siblings?: Yes Number of Siblings: 4 Description of patient's current relationship with siblings: good close relationship Did patient suffer any verbal/emotional/physical/sexual abuse as a child?: No Did patient suffer from severe childhood neglect?: No Has patient ever been sexually abused/assaulted/raped as an adolescent or adult?: No Was the patient ever a victim of a crime or a disaster?: No Witnessed domestic violence?: No Has patient been affected by domestic violence as an adult?: No  Child/Adolescent Assessment:  N/A   CCA Substance Use Alcohol/Drug Use: Alcohol / Drug Use Pain Medications: see patient record Prescriptions: see patient record Over the Counter: see patient record History of alcohol / drug use?: No history of alcohol / drug abuse  ASAM's:  Six Dimensions of Multidimensional Assessment  Dimension 1:  Acute Intoxication and/or Withdrawal Potential:   Dimension 1:  Description of individual's past and current experiences of substance use and  withdrawal: none  Dimension 2:  Biomedical Conditions and Complications:   Dimension 2:  Description of patient's biomedical conditions and  complications: none  Dimension 3:  Emotional, Behavioral, or Cognitive Conditions and Complications:  Dimension 3:  Description of emotional, behavioral, or cognitive conditions and complications: none  Dimension 4:  Readiness to Change:  Dimension 4:  Description of Readiness to Change criteria: none  Dimension 5:  Relapse, Continued use, or  Continued Problem Potential:  Dimension 5:  Relapse, continued use, or continued problem potential critiera description: none  Dimension 6:  Recovery/Living Environment:  Dimension 6:  Recovery/Iiving environment criteria description: none  ASAM Severity Score: ASAM's Severity Rating Score: 0  ASAM Recommended Level of Treatment:     Substance use Disorder (SUD) None  Recommendations for Services/Supports/Treatments: Individual therapy/medication management: Patient attends assessment appointment today.  Confidentiality and limits are discussed.  Nutritional assessment, pain assessment, PHQ 2 and 9 with C-S SRS administered.  Patient agrees to return for an appointment in 2 weeks.  Individual therapy is recommended every 1 to 2 weeks to learn and implement coping skills to overcome depression, cope with anxiety, and reduce intensity/frequency of anger outburst.  Patient will continue to see psychiatrist Dr. 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   Asthma 10/29/2019   Family history of lung cancer    Rash and nonspecific skin eruption 05/14/2017   Chronic migraine 12/06/2016   Obesity (BMI 30.0-34.9) 03/17/2015   Essential hypertension 01/07/2014   Pain in thoracic spine 08/07/2013   Low back pain with radiation 07/30/2013   Vitamin D deficiency 10/27/2010   Allergic rhinitis 12/01/2008   Neck pain 07/04/2007    Patient Centered Plan: Patient is on the following Treatment Plan(s): Will be developed next session   Referrals to Alternative Service(s): Referred to Alternative Service(s):   Place:   Date:   Time:    Referred to Alternative Service(s):   Place:   Date:   Time:    Referred to Alternative Service(s):   Place:   Date:   Time:    Referred to Alternative Service(s):   Place:   Date:   Time:      Rebecca Smoker, LCSW

## 2021-01-06 ENCOUNTER — Encounter: Payer: Self-pay | Admitting: Family Medicine

## 2021-01-06 ENCOUNTER — Emergency Department (HOSPITAL_COMMUNITY)
Admission: EM | Admit: 2021-01-06 | Discharge: 2021-01-06 | Disposition: A | Discharge status: Home / Self Care | Payer: BC Managed Care – PPO | Attending: Emergency Medicine | Admitting: Emergency Medicine

## 2021-01-06 ENCOUNTER — Other Ambulatory Visit: Payer: Self-pay

## 2021-01-06 ENCOUNTER — Encounter (HOSPITAL_COMMUNITY): Payer: Self-pay

## 2021-01-06 DIAGNOSIS — Z87891 Personal history of nicotine dependence: Secondary | ICD-10-CM | POA: Diagnosis not present

## 2021-01-06 DIAGNOSIS — I10 Essential (primary) hypertension: Secondary | ICD-10-CM | POA: Diagnosis not present

## 2021-01-06 DIAGNOSIS — Z91040 Latex allergy status: Secondary | ICD-10-CM | POA: Insufficient documentation

## 2021-01-06 DIAGNOSIS — L539 Erythematous condition, unspecified: Secondary | ICD-10-CM | POA: Insufficient documentation

## 2021-01-06 DIAGNOSIS — U071 COVID-19: Secondary | ICD-10-CM | POA: Diagnosis not present

## 2021-01-06 DIAGNOSIS — J45909 Unspecified asthma, uncomplicated: Secondary | ICD-10-CM | POA: Insufficient documentation

## 2021-01-06 DIAGNOSIS — Z79899 Other long term (current) drug therapy: Secondary | ICD-10-CM | POA: Diagnosis not present

## 2021-01-06 DIAGNOSIS — R059 Cough, unspecified: Secondary | ICD-10-CM | POA: Diagnosis not present

## 2021-01-06 DIAGNOSIS — Z20822 Contact with and (suspected) exposure to covid-19: Secondary | ICD-10-CM

## 2021-01-06 LAB — RESP PANEL BY RT-PCR (FLU A&B, COVID) ARPGX2
Influenza A by PCR: NEGATIVE
Influenza B by PCR: NEGATIVE
SARS Coronavirus 2 by RT PCR: POSITIVE — AB

## 2021-01-06 NOTE — ED Notes (Signed)
Pt called and informed covid positive.

## 2021-01-06 NOTE — ED Triage Notes (Signed)
Pt presents to ED with complaints of body aches, non productive cough, chills, headache started yesterday.

## 2021-01-06 NOTE — ED Provider Notes (Signed)
[facility] EMERGENCY DEPARTMENT Provider Note   CSN: [id number] Arrival date & time: 01/06/21  0809     History Chief Complaint  Patient presents with   Cough    Rebecca Boyd is a 45 y.o. female.  Patient with onset yesterday body aches headache cough itchy throat occasional wheezing.  No nausea vomiting or diarrhea.  No known COVID exposure.  Patient took NyQuil at midnight.  Patient with room air sats of 97%.  Not tachypneic.  Temp 99.1.  Slightly tachycardic.      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   Initial dx is 05/2013    Hypertension    Migraine    Migraines    Miscarriage 2009   Neck pain    Nondisplaced fracture of fifth metatarsal bone, left foot, subsequent encounter for fracture with routine healing 08/15/18 09/25/2018   Pruritus 09/29/2018   Pruritus ani 09/25/2018   Rash and nonspecific skin eruption 05/14/2017   Vaginitis    cyctitis    Patient Active Problem List   Diagnosis Date Noted   Acute vaginitis 10/23/2020   Numbness and tingling of left hand 06/08/2020   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   Asthma 10/29/2019   Family history of lung cancer    Rash and nonspecific skin eruption 05/14/2017   Chronic migraine 12/06/2016   Obesity (BMI 30.0-34.9) 03/17/2015   Essential hypertension 01/07/2014   Pain in thoracic spine 08/07/2013   Low back pain with radiation 07/30/2013   Vitamin D deficiency 10/27/2010   Allergic rhinitis 12/01/2008   Neck pain 07/04/2007     Past Surgical History:  Procedure Laterality Date   cervical cryotherapy N/A 1999   DILITATION & CURRETTAGE/HYSTROSCOPY WITH THERMACHOICE ABLATION  08/26/2013   Procedure: DILATATION & CURETTAGE/HYSTEROSCOPY WITH THERMACHOICE ENDOMETRIAL ABLATION Procedure #2 Total Therapy Time=min       sec;  Surgeon: Jonnie Kind, MD;  Location: [facility];  Service: Gynecology;;   LAPAROSCOPIC BILATERAL SALPINGECTOMY Bilateral 08/26/2013   Procedure: LAPAROSCOPIC BILATERAL SALPINGECTOMY AND REMOVAL OF LEFT PERITUBAL CYST Procedure #1;  Surgeon: Jonnie Kind, MD;  Location: [facility];  Service: Gynecology;  Laterality: Bilateral;   LAPAROSCOPIC LYSIS OF ADHESIONS  08/26/2013   Procedure: LAPAROSCOPIC LYSIS OF ADHESIONS Procedure #1;  Surgeon: Jonnie Kind, MD;  Location: [facility];  Service: Gynecology;;   VULVAR LESION REMOVAL  08/26/2013   Procedure: REMOVAL OF VULVAR SEBACEOUS CYST Procedure #3;  Surgeon: Jonnie Kind, MD;  Location: [facility];  Service: Gynecology;;     OB History   No obstetric history on file.     Family History  Problem Relation Age of Onset   Depression Mother    Drug abuse Mother    Hypertension Father    Breast cancer Sister        dx 57   Hypertension Maternal Grandfather    Hypertension Maternal Grandmother    Hypertension Paternal Grandmother  Breast cancer Paternal Grandmother        dx late 21s   Colon cancer Cousin 51    Social History   Tobacco Use   Smoking status: Former    Packs/day: 0.00    Years: 1.00    Pack years: 0.00    Types: Cigars, Cigarettes    Quit date: 01/05/2017    Years since quitting: 4.0   Smokeless tobacco: Never  Vaping Use   Vaping Use: Never used  Substance Use Topics   Alcohol use: Yes    Comment: occasional   Drug use: No    Home Medications Prior to Admission medications   Medication Sig Start Date End Date Taking? Authorizing Provider  ALBUTEROL SULFATE HFA IN Inhale 2 puffs into the lungs.    [provider]  amLODipine (NORVASC) 10 MG tablet Take 1 tablet (10 mg total) by mouth daily. 02/11/20   Fayrene Helper, MD  betamethasone dipropionate 0.05 % cream Apply topically 2 (two) times daily. 06/14/20   Fayrene Helper, MD  Erenumab-aooe (AIMOVIG) 70 MG/ML SOAJ Inject 70 mg into the skin every 30 (thirty) days. 08/12/19   Suzzanne Cloud, NP  ergocalciferol (VITAMIN D2) 1.25 MG (50000 UT) capsule Take 1 capsule (50,000 Units total) by mouth once a week. One capsule once weekly 03/22/20   Fayrene Helper, MD  imipramine (TOFRANIL) 25 MG tablet Take 25 mg by mouth at bedtime. 09/26/20   [provider]  montelukast (SINGULAIR) 10 MG tablet Take 1 tablet (10 mg total) by mouth at bedtime. 08/17/20   Fayrene Helper, MD  oxyCODONE-acetaminophen (PERCOCET/ROXICET) 5-325 MG tablet Take 1-2 tablets by mouth 2 (two) times daily as needed.  03/10/20   [provider]  propranolol ER (INDERAL LA) 60 MG 24 hr capsule Take 1 capsule (60 mg total) by mouth daily. Patient not taking: No sig reported 11/23/20   Noreene Larsson, NP  SUMAtriptan [facility]) 6 MG/0.5ML SOLN injection Use one injection at onset of migraine.  May repeat in 2 hrs, if needed.  Max dose: 2 inj/day. This is a 30 day prescription. 08/12/19   Suzzanne Cloud, NP  topiramate (TOPAMAX) 100 MG tablet Take 1 tablet (100 mg total) by mouth 2 (two) times daily. 08/12/19   Suzzanne Cloud, NP  triamterene-hydrochlorothiazide (MAXZIDE) 75-50 MG tablet Take 1 tablet by mouth daily. Patient not taking: Reported on 12/28/2020 02/11/20   Fayrene Helper, MD  Vitamin D, Ergocalciferol, (DRISDOL) 1.25 MG (50000 UNIT) CAPS capsule Take 1 capsule (50,000 Units total) by mouth every 7 (seven) days. 10/22/20   Fayrene Helper, MD  fluticasone (FLONASE) 50 MCG/ACT nasal spray Place 2 sprays into both nostrils daily. 04/10/19 07/08/19  Perlie Mayo, NP    Allergies    Diphenhydramine hcl, Orange fruit, and Latex  Review of Systems    Review of Systems  Constitutional:  Positive for chills. Negative for fever.  HENT:  Positive for sore throat. Negative for congestion and ear pain.   Eyes:  Negative for pain and visual disturbance.  Respiratory:  Positive for cough and wheezing. Negative for shortness of breath.   Cardiovascular:  Negative for chest pain and palpitations.  Gastrointestinal:  Negative for abdominal pain, diarrhea, nausea and vomiting.  Genitourinary:  Negative for dysuria and hematuria.  Musculoskeletal:  Positive for myalgias. Negative for arthralgias and back pain.  Skin:  Negative for color change and rash.  Neurological:  Positive for headaches. Negative for  seizures and syncope.  All other systems reviewed and are negative.  Physical Exam Updated Vital Signs BP (!) 142/99 (BP Location: Right Arm)   Pulse (!) 108   Temp 99.1 F (37.3 C) (Oral)   Resp 18   Ht 1.524 m (5')   Wt 83.5 kg   SpO2 97%   BMI 35.94 kg/m   Physical Exam Vitals and nursing note reviewed.  Constitutional:      General: She is not in acute distress.    Appearance: Normal appearance. She is well-developed.  HENT:     Head: Normocephalic and atraumatic.     Mouth/Throat:     Mouth: Mucous membranes are moist.     Pharynx: Posterior oropharyngeal erythema present. No oropharyngeal exudate.     Comments: Uvula midline.  Some erythema.  No tonsillar enlargement no exudate. Eyes:     Extraocular Movements: Extraocular movements intact.     Conjunctiva/sclera: Conjunctivae normal.     Pupils: Pupils are equal, round, and reactive to light.  Cardiovascular:     Rate and Rhythm: Normal rate and regular rhythm.     Heart sounds: No murmur heard. Pulmonary:     Effort: Pulmonary effort is normal. No respiratory distress.     Breath sounds: Normal breath sounds. No wheezing or rales.  Abdominal:     Palpations: Abdomen is soft.     Tenderness: There is no abdominal tenderness.  Musculoskeletal:        General:  Normal range of motion.     Cervical back: Normal range of motion and neck supple.  Skin:    General: Skin is warm and dry.     Capillary Refill: Capillary refill takes less than 2 seconds.  Neurological:     General: No focal deficit present.     Mental Status: She is alert and oriented to person, place, and time.    ED Results / Procedures / Treatments   Labs (all labs ordered are listed, but only abnormal results are displayed) Labs Reviewed - No data to display  EKG None  Radiology No results found.  Procedures Procedures   Medications Ordered in ED Medications - No data to display  ED Course  I have reviewed the triage vital signs and the nursing notes.  Pertinent labs & imaging results that were available during my care of the patient were reviewed by me and considered in my medical decision making (see chart for details).    MDM Rules/Calculators/A&P                           Patient's symptoms suggestive of COVID infection.  But may be other viral upper respiratory illness.  Patient without any wheezing currently.  No respiratory distress.  Patient does have a history of asthma.  She has albuterol inhaler at home.  Patient nontoxic.  Patient given precautions symptomatic treatment.  COVID test still be done and patient will follow up on MyChart. Final Clinical Impression(s) / ED Diagnoses Final diagnoses:  Suspected COVID-19 virus infection    Rx / DC Orders ED Discharge Orders     None        Fredia Sorrow, MD 01/06/21 [phone]

## 2021-01-06 NOTE — Discharge Instructions (Addendum)
Follow-up your COVID test on MyChart.  If positive mandatory isolation for 5 days.  Symptomatic treatment.  Use your albuterol inhaler every 6 hours.  Return for any new or worse symptoms or any respiratory difficulties.  Follow-up with your primary care doctor as needed.

## 2021-01-11 ENCOUNTER — Other Ambulatory Visit: Payer: Self-pay

## 2021-01-11 ENCOUNTER — Ambulatory Visit: Payer: BC Managed Care – PPO

## 2021-01-11 DIAGNOSIS — Z20822 Contact with and (suspected) exposure to covid-19: Secondary | ICD-10-CM | POA: Diagnosis not present

## 2021-01-12 ENCOUNTER — Encounter: Payer: Self-pay | Admitting: Family Medicine

## 2021-01-13 ENCOUNTER — Other Ambulatory Visit: Payer: Self-pay | Admitting: Family Medicine

## 2021-01-13 LAB — NOVEL CORONAVIRUS, NAA: SARS-CoV-2, NAA: NOT DETECTED

## 2021-01-13 LAB — SARS-COV-2, NAA 2 DAY TAT

## 2021-02-08 ENCOUNTER — Other Ambulatory Visit: Payer: Self-pay

## 2021-02-08 ENCOUNTER — Telehealth: Payer: Self-pay | Admitting: Family Medicine

## 2021-02-08 ENCOUNTER — Other Ambulatory Visit: Payer: Self-pay | Admitting: Family Medicine

## 2021-02-08 MED ORDER — AMLODIPINE BESYLATE 10 MG PO TABS: 10.0000 mg | ORAL_TABLET | Freq: Every day | ORAL | 1 refills | Status: DC

## 2021-02-08 NOTE — Telephone Encounter (Signed)
Refill sent.

## 2021-02-08 NOTE — Telephone Encounter (Signed)
Pt called in requesting refill on Amlodipine

## 2021-02-14 ENCOUNTER — Ambulatory Visit (HOSPITAL_COMMUNITY)
Admission: RE | Admit: 2021-02-14 | Discharge: 2021-02-14 | Disposition: A | Discharge status: Home / Self Care | Payer: BC Managed Care – PPO | Source: Ambulatory Visit | Attending: Family Medicine | Admitting: Family Medicine

## 2021-02-14 ENCOUNTER — Other Ambulatory Visit: Payer: Self-pay

## 2021-02-14 DIAGNOSIS — Z1231 Encounter for screening mammogram for malignant neoplasm of breast: Secondary | ICD-10-CM | POA: Diagnosis not present

## 2021-02-18 ENCOUNTER — Other Ambulatory Visit: Payer: Self-pay

## 2021-02-18 ENCOUNTER — Encounter: Payer: Self-pay | Admitting: *Deleted

## 2021-02-18 ENCOUNTER — Ambulatory Visit: Payer: BC Managed Care – PPO | Admitting: Gastroenterology

## 2021-02-18 ENCOUNTER — Encounter: Payer: Self-pay | Admitting: Gastroenterology

## 2021-02-18 DIAGNOSIS — Z1211 Encounter for screening for malignant neoplasm of colon: Secondary | ICD-10-CM

## 2021-02-18 DIAGNOSIS — Z800 Family history of malignant neoplasm of digestive organs: Secondary | ICD-10-CM | POA: Diagnosis not present

## 2021-02-18 MED ORDER — PEG 3350-KCL-NA BICARB-NACL 420 G PO SOLR: ORAL | 0 refills | Status: DC

## 2021-02-18 NOTE — Progress Notes (Signed)
Primary Care Physician:  Fayrene Helper, MD  Primary Gastroenterologist:    Chief Complaint  Patient presents with   Colonoscopy    Never had tcs. Has family hx of cancer     HPI:  Rebecca Boyd is a 45 y.o. female here at the request of Dr. Moshe Cipro to schedule first-ever screening colonoscopy.  She turns 45 November 1.  Maternal cousin with colon cancer at age 58, maternal aunt with stage IV colon cancer diagnosed at age 13.  Patient also has a sister with breast cancer diagnosed in her 75s.  From a GI standpoint she is doing well.  Denies any bowel concerns.  No blood in stool or melena.  No abdominal pain, unintentional weight loss, vomiting, constipation, diarrhea, heartburn.  Current Outpatient Medications  Medication Sig Dispense Refill   ALBUTEROL SULFATE HFA IN Inhale 2 puffs into the lungs as needed.     amLODipine (NORVASC) 10 MG tablet Take 1 tablet (10 mg total) by mouth daily. 90 tablet 1   betamethasone dipropionate 0.05 % cream Apply topically 2 (two) times daily. 45 g 0   Erenumab-aooe (AIMOVIG) 70 MG/ML SOAJ Inject 70 mg into the skin every 30 (thirty) days. 1 pen 11   ergocalciferol (VITAMIN D2) 1.25 MG (50000 UT) capsule Take 1 capsule (50,000 Units total) by mouth once a week. One capsule once weekly 12 capsule 2   imipramine (TOFRANIL) 25 MG tablet Take 25 mg by mouth at bedtime.     metroNIDAZOLE (METROGEL) 0.75 % vaginal gel USE 1 APPLICATORFUL VAGINALLY 2 TIMES A DAY (Patient taking differently: as needed.) 70 g 0   montelukast (SINGULAIR) 10 MG tablet Take 1 tablet (10 mg total) by mouth at bedtime. (Patient taking differently: Take 10 mg by mouth as needed.) 30 tablet 3   oxyCODONE-acetaminophen (PERCOCET/ROXICET) 5-325 MG tablet Take 1-2 tablets by mouth 2 (two) times daily as needed.      propranolol ER (INDERAL LA) 60 MG 24 hr capsule Take 1 capsule (60 mg total) by mouth daily. 30 capsule 1   SUMAtriptan (IMITREX) 6 MG/0.5ML SOLN injection Use one  injection at onset of migraine.  May repeat in 2 hrs, if needed.  Max dose: 2 inj/day. This is a 30 day prescription. 5 mL 11   topiramate (TOPAMAX) 100 MG tablet Take 1 tablet (100 mg total) by mouth 2 (two) times daily. 180 tablet 4   Vitamin D, Ergocalciferol, (DRISDOL) 1.25 MG (50000 UNIT) CAPS capsule Take 1 capsule (50,000 Units total) by mouth every 7 (seven) days. 12 capsule 3   No current facility-administered medications for this visit.    Allergies as of 02/18/2021 - Review Complete 02/18/2021  Allergen Reaction Noted   Diphenhydramine hcl Anaphylaxis 07/04/2007   Orange fruit Hives, Shortness Of Breath, Itching, and Other (See Comments) 11/24/2010   Latex Rash 11/03/2013    Past Medical History:  Diagnosis Date   Anemia    Anxiety    Bacterial vaginosis 07/27/2014   Cervical neck pain with evidence of disc disease 10/02/2013   Depressive disorder, not elsewhere classified    Epistaxis 07/09/2019   Family history of breast cancer    Family history of breast cancer in sister 09/25/2018   Family history of colon cancer    Family history of lung cancer    FH: breast cancer in relative when <49 years old 06/10/2018   Dx at age 68   Headache 03/16/2016   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   Initial dx is  05/2013    Hypertension    Migraine    Migraines    Miscarriage 2009   Neck pain    Nondisplaced fracture of fifth metatarsal bone, left foot, subsequent encounter for fracture with routine healing 08/15/18 09/25/2018   Pruritus 09/29/2018   Pruritus ani 09/25/2018   Rash and nonspecific skin eruption 05/14/2017   Vaginitis    cyctitis    Past Surgical History:  Procedure Laterality Date   cervical cryotherapy N/A 1999   DILITATION & CURRETTAGE/HYSTROSCOPY WITH THERMACHOICE ABLATION  08/26/2013   Procedure: DILATATION & CURETTAGE/HYSTEROSCOPY WITH THERMACHOICE ENDOMETRIAL ABLATION Procedure #2 Total Therapy Time=min       sec;  Surgeon: Jonnie Kind, MD;  Location: [facility];   Service: Gynecology;;   LAPAROSCOPIC BILATERAL SALPINGECTOMY Bilateral 08/26/2013   Procedure: LAPAROSCOPIC BILATERAL SALPINGECTOMY AND REMOVAL OF LEFT PERITUBAL CYST Procedure #1;  Surgeon: Jonnie Kind, MD;  Location: [facility];  Service: Gynecology;  Laterality: Bilateral;   LAPAROSCOPIC LYSIS OF ADHESIONS  08/26/2013   Procedure: LAPAROSCOPIC LYSIS OF ADHESIONS Procedure #1;  Surgeon: Jonnie Kind, MD;  Location: [facility];  Service: Gynecology;;   VULVAR LESION REMOVAL  08/26/2013   Procedure: REMOVAL OF VULVAR SEBACEOUS CYST Procedure #3;  Surgeon: Jonnie Kind, MD;  Location: [facility];  Service: Gynecology;;    Family History  Problem Relation Age of Onset   Depression Mother    Drug abuse Mother    Hypertension Father    Breast cancer Sister        dx 35   Hypertension Maternal Grandmother    Hypertension Maternal Grandfather    Lung cancer Maternal Grandfather    Hypertension Paternal Grandmother    Breast cancer Paternal Grandmother        dx late 76s   Colon cancer Maternal Aunt        62   Colon cancer Cousin 32       maternal    Social History   Socioeconomic History   Marital status: Single    Spouse name: Not on file   Number of children: Not on file   Years of education: Not on file   Highest education level: Not on file  Occupational History   Not on file  Tobacco Use   Smoking status: Former    Packs/day: 0.00    Years: 1.00    Pack years: 0.00    Types: Cigars, Cigarettes    Quit date: 01/05/2017    Years since quitting: 4.1   Smokeless tobacco: Never  Vaping Use   Vaping Use: Never used  Substance and Sexual Activity   Alcohol use: Yes    Comment: occasional   Drug use: No   Sexual activity: Yes    Birth control/protection: Condom  Other Topics Concern   Not on file  Social History Narrative   Lives home with mother.  Works at [employer].  Education 10th grade/GED.  No children.  Single.     Social Determinants of Health    Financial Resource Strain: Not on file  Food Insecurity: Not on file  Transportation Needs: Not on file  Physical Activity: Not on file  Stress: Not on file  Social Connections: Not on file  Intimate Partner Violence: Not on file      ROS:  General: Negative for anorexia, weight loss, fever, chills, fatigue, weakness. Eyes: Negative for vision changes.  ENT: Negative for hoarseness, difficulty swallowing , nasal congestion. CV: Negative for chest pain, angina,  palpitations, dyspnea on exertion, peripheral edema.  Respiratory: Negative for dyspnea at rest, dyspnea on exertion, cough, sputum, wheezing.  GI: See history of present illness. GU:  Negative for dysuria, hematuria, urinary incontinence, urinary frequency, nocturnal urination.  MS: Negative for joint pain, low back pain.  Derm: Negative for rash or itching.  Neuro: Negative for weakness, abnormal sensation, seizure, frequent headaches, memory loss, confusion.  Migraines infrequent at this time. Psych: Negative for anxiety, depression, suicidal ideation, hallucinations.  Endo: Negative for unusual weight change.  Heme: Negative for bruising or bleeding. Allergy: Negative for rash or hives.    Physical Examination:  BP (!) 134/92   Pulse 86   Temp 98 F (36.7 C) (Temporal)   Ht 5' (1.524 m)   Wt 189 lb 12.8 oz (86.1 kg)   LMP 02/12/2021 (Approximate)   BMI 37.07 kg/m    General: Well-nourished, well-developed in no acute distress.  Head: Normocephalic, atraumatic.   Eyes: Conjunctiva pink, no icterus. Mouth: masked Neck: Supple without thyromegaly, masses, or lymphadenopathy.  Lungs: Clear to auscultation bilaterally.  Heart: Regular rate and rhythm, no murmurs rubs or gallops.  Abdomen: Bowel sounds are normal, nontender, nondistended, no hepatosplenomegaly or masses, no abdominal bruits or    hernia , no rebound or guarding.   Rectal: not performed Extremities: No lower extremity edema. No clubbing or  deformities.  Neuro: Alert and oriented x 4 , grossly normal neurologically.  Skin: Warm and dry, no rash or jaundice.   Psych: Alert and cooperative, normal mood and affect.  Labs: Lab Results  Component Value Date   CREATININE 0.69 10/21/2020   BUN 7 10/21/2020   NA 140 10/21/2020   K 3.7 10/21/2020   CL 101 10/21/2020   CO2 21 10/21/2020   Lab Results  Component Value Date   ALT 15 10/21/2020   AST 17 10/21/2020   ALKPHOS 107 10/21/2020   BILITOT 0.2 10/21/2020     Imaging Studies: No results found.   Assessment/Plan:  Very pleasant 45 year old female presenting to schedule first-ever screening colonoscopy.  She has 2 second-degree relatives who have had colon cancer.  Recommend colonoscopy at any time.  We will plan on deep sedation given polypharmacy. ASA II.  I have discussed the risks, alternatives, benefits with regards to but not limited to the risk of reaction to medication, bleeding, infection, perforation and the patient is agreeable to proceed. Written consent to be obtained.  Hold Topamax 7 days prior to procedure.

## 2021-02-18 NOTE — Patient Instructions (Signed)
Colonoscopy as scheduled. See separate instructions.  Hold Topamax 7 days before your colonoscopy.

## 2021-03-10 NOTE — Progress Notes (Signed)
Virtual Visit via Telephone Note  I connected with Rebecca Boyd on 03/15/21 at  1:40 PM EST by telephone and verified that I am speaking with the correct person using two identifiers.  Location: Patient: home Provider: office Persons participated in the visit- patient, provider    I discussed the limitations, risks, security and privacy concerns of performing an evaluation and management service by telephone and the availability of in person appointments. I also discussed with the patient that there may be a patient responsible charge related to this service. The patient expressed understanding and agreed to proceed.    I discussed the assessment and treatment plan with the patient. The patient was provided an opportunity to ask questions and all were answered. The patient agreed with the plan and demonstrated an understanding of the instructions.   The patient was advised to call back or seek an in-person evaluation if the symptoms worsen or if the condition fails to improve as anticipated.  I provided 10 minutes of non-face-to-face time during this encounter.   [name], MD    [facility] MD/PA/NP OP Progress Note  03/15/2021 1:59 PM Rebecca Boyd  MRN:  [id number]  Chief Complaint:  Chief Complaint   Follow-up; Anxiety    HPI:  This is a follow-up appointment for depression and anxiety.  She states that there has been so much going on, stating that she lost her maternal aunt and her friend.  She reports close relationship with each of them.  She continues to feel down, and has anxiety, although her panic attacks has been less intense.  Although she initially asks her preference to be back on Xanax, which worked very well in the past, she is now willing to try nortriptyline.  She has occasional insomnia.  She has not been able to get CPAP machine yet.  She occasionally feels fatigue.  She has fair concentration, and denies significant issues at work.  She denies change in  appetite.  She denies SI.  She rarely drinks alcohol.  She denies drug use.  Although she was not aware that she had not seen a therapist for a while, she is willing to make a follow-up appointment again.   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 GAD (generalized anxiety disorder)  F41.1     2. MDD (major depressive disorder), recurrent episode, mild ([place])  F33.0       Past Psychiatric History: Please see initial evaluation for full details. I have reviewed the history. No updates at this time.     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   Initial dx is 05/2013    Hypertension    Migraine  Migraines    Miscarriage 2009   Neck pain    Nondisplaced fracture of fifth metatarsal bone, left foot, subsequent encounter for fracture with routine healing 08/15/18 09/25/2018   Pruritus 09/29/2018   Pruritus ani 09/25/2018   Rash and nonspecific skin eruption 05/14/2017   Vaginitis    cyctitis    Past Surgical History:  Procedure Laterality Date   cervical cryotherapy N/A 1999   DILITATION & CURRETTAGE/HYSTROSCOPY WITH THERMACHOICE ABLATION  08/26/2013   Procedure: DILATATION & CURETTAGE/HYSTEROSCOPY WITH THERMACHOICE ENDOMETRIAL ABLATION Procedure #2 Total Therapy  Time=min       sec;  Surgeon: Jonnie Kind, MD;  Location: [facility];  Service: Gynecology;;   LAPAROSCOPIC BILATERAL SALPINGECTOMY Bilateral 08/26/2013   Procedure: LAPAROSCOPIC BILATERAL SALPINGECTOMY AND REMOVAL OF LEFT PERITUBAL CYST Procedure #1;  Surgeon: Jonnie Kind, MD;  Location: [facility];  Service: Gynecology;  Laterality: Bilateral;   LAPAROSCOPIC LYSIS OF ADHESIONS  08/26/2013   Procedure: LAPAROSCOPIC LYSIS OF ADHESIONS Procedure #1;  Surgeon: Jonnie Kind, MD;  Location: [facility];  Service: Gynecology;;   VULVAR LESION REMOVAL  08/26/2013   Procedure: REMOVAL OF VULVAR SEBACEOUS CYST Procedure #3;  Surgeon: Jonnie Kind, MD;  Location: [facility];  Service: Gynecology;;    Family Psychiatric History: Please see initial evaluation for full details. I have reviewed the history. No updates at this time.     Family History:  Family History  Problem Relation Age of Onset   Depression Mother    Drug abuse Mother    Hypertension Father    Breast cancer Sister        dx 84   Hypertension Maternal Grandmother    Hypertension Maternal Grandfather    Lung cancer Maternal Grandfather    Hypertension Paternal Grandmother    Breast cancer Paternal Grandmother        dx late 35s   Colon cancer Maternal Aunt        62   Colon cancer Cousin 21       maternal    Social History:  Social History   Socioeconomic History   Marital status: Single    Spouse name: Not on file   Number of children: Not on file   Years of education: Not on file   Highest education level: Not on file  Occupational History   Not on file  Tobacco Use   Smoking status: Former    Packs/day: 0.00    Years: 1.00    Pack years: 0.00    Types: Cigars, Cigarettes    Quit date: 01/05/2017    Years since quitting: 4.1   Smokeless tobacco: Never  Vaping Use   Vaping Use: Never used  Substance and Sexual Activity   Alcohol use: Yes    Comment: occasional   Drug use: No   Sexual activity: Yes    Birth  control/protection: Condom  Other Topics Concern   Not on file  Social History Narrative   Lives home with mother.  Works at [employer].  Education 10th grade/GED.  No children.  Single.     Social Determinants of Health   Financial Resource Strain: Not on file  Food Insecurity: Not on file  Transportation Needs: Not on file  Physical Activity: Not on file  Stress: Not on file  Social Connections: Not on file    Allergies:  Allergies  Allergen Reactions   Diphenhydramine Hcl Anaphylaxis    (Benadryl)   Orange Fruit Hives, Shortness Of Breath, Itching  and Other (See Comments)    MAKES IT DIFFICULT TO BREATH   Latex Rash    Metabolic Disorder Labs: Lab Results  Component Value Date   HGBA1C 5.3 03/18/2020   MPG 103 06/10/2018   MPG 108 05/29/2013   No results found for: PROLACTIN Lab Results  Component Value Date   CHOL 188 10/21/2020   TRIG 75 10/21/2020   HDL 52 10/21/2020   CHOLHDL 3.6 10/21/2020   VLDL 19 05/10/2016   LDLCALC 122 (H) 10/21/2020   LDLCALC 133 (H) 03/18/2020   Lab Results  Component Value Date   TSH 1.140 10/21/2020   TSH 0.951 03/18/2020    Therapeutic Level Labs: No results found for: LITHIUM No results found for: VALPROATE No components found for:  CBMZ  Current Medications: Current Outpatient Medications  Medication Sig Dispense Refill   nortriptyline (PAMELOR) 25 MG capsule Take 1 capsule (25 mg total) by mouth daily. 30 capsule 1   ALBUTEROL SULFATE HFA IN Inhale 2 puffs into the lungs as needed.     amLODipine (NORVASC) 10 MG tablet Take 1 tablet (10 mg total) by mouth daily. 90 tablet 1   betamethasone dipropionate 0.05 % cream Apply topically 2 (two) times daily. 45 g 0   Erenumab-aooe (AIMOVIG) 70 MG/ML SOAJ Inject 70 mg into the skin every 30 (thirty) days. 1 pen 11   ergocalciferol (VITAMIN D2) 1.25 MG (50000 UT) capsule Take 1 capsule (50,000 Units total) by mouth once a week. One capsule once weekly 12 capsule 2    metroNIDAZOLE (METROGEL) 0.75 % vaginal gel USE 1 APPLICATORFUL VAGINALLY 2 TIMES A DAY (Patient taking differently: as needed.) 70 g 0   montelukast (SINGULAIR) 10 MG tablet Take 1 tablet (10 mg total) by mouth at bedtime. (Patient taking differently: Take 10 mg by mouth as needed.) 30 tablet 3   oxyCODONE-acetaminophen (PERCOCET/ROXICET) 5-325 MG tablet Take 1-2 tablets by mouth 2 (two) times daily as needed.      polyethylene glycol-electrolytes (NULYTELY) 420 g solution As directed 4000 mL 0   propranolol ER (INDERAL LA) 60 MG 24 hr capsule Take 1 capsule (60 mg total) by mouth daily. 30 capsule 1   SUMAtriptan (IMITREX) 6 MG/0.5ML SOLN injection Use one injection at onset of migraine.  May repeat in 2 hrs, if needed.  Max dose: 2 inj/day. This is a 30 day prescription. 5 mL 11   topiramate (TOPAMAX) 100 MG tablet Take 1 tablet (100 mg total) by mouth 2 (two) times daily. 180 tablet 4   Vitamin D, Ergocalciferol, (DRISDOL) 1.25 MG (50000 UNIT) CAPS capsule Take 1 capsule (50,000 Units total) by mouth every 7 (seven) days. 12 capsule 3   No current facility-administered medications for this visit.     Musculoskeletal: Strength & Muscle Tone:  N/A Gait & Station:  N/A Patient leans: N/A  Psychiatric Specialty Exam: Review of Systems  Psychiatric/Behavioral:  Positive for dysphoric mood and sleep disturbance. Negative for agitation, behavioral problems, confusion, decreased concentration, hallucinations, self-injury and suicidal ideas. The patient is nervous/anxious. The patient is not hyperactive.   All other systems reviewed and are negative.  There were no vitals taken for this visit.There is no height or weight on file to calculate BMI.  General Appearance: Fairly Groomed  Eye Contact:  Good  Speech:  Clear and Coherent  Volume:  Normal  Mood:  Anxious and Depressed  Affect:  Appropriate, Congruent, and fatigue  Thought Process:  Coherent  Orientation:  Full (Time, Place, and  Person)  Thought Content: Logical   Suicidal Thoughts:  No  Homicidal Thoughts:  No  Memory:  Immediate;   Good  Judgement:  Good  Insight:  Good  Psychomotor Activity:  Normal  Concentration:  Concentration: Good and Attention Span: Good  Recall:  Good  Fund of Knowledge: Good  Language: Good  Akathisia:  No  Handed:  Right  AIMS (if indicated): not done  Assets:  Communication Skills Desire for Improvement  ADL's:  Intact  Cognition: WNL  Sleep:  Poor   Screenings: GAD-7    Flowsheet Row Office Visit from 12/10/2019 in [facility] Office Visit from 10/28/2019 in [facility] Office Visit from 09/25/2019 in [facility] Office Visit from 05/21/2019 in [facility]  Total GAD-7 Score 14 15 6 18       PHQ2-9    Flowsheet Row Counselor from 12/28/2020 in [facility] Office Visit from 12/24/2020 in [facility] Video Visit from 12/14/2020 in [facility] Office Visit from 11/23/2020 in [facility] Office Visit from 10/21/2020 in [facility]  PHQ-2 Total Score 2 2 2 5 6   PHQ-9 Total Score 6 7 7 9 13       Flowsheet Row ED from 03/11/2021 in [facility] from 01/06/2021 in [facility] Counselor from 12/28/2020 in [facility] No Risk No Risk No Risk        Assessment and Plan:  DIMITRI DSOUZA is a 45 y.o. year old female with a history of Depressive Disorder NOS and Psychotic Disorder NOS, hypertension, migraine, who presents for follow up appointment for below.    1. GAD (generalized anxiety disorder) 2. MDD (major depressive disorder), recurrent episode, mild ([place]) She continues to report depressive and anxiety symptoms in the context of loss, including her aunt her friend.  Other psychosocial stressors includes  loss of her  maternal uncle, taking care of her mother with COPD, and her sister with breast cancer.  Will start nortriptyline to target anxiety, depression, which may be also helpful for migraine.  Discussed potential risk of palpitation, weight gain and dry mouth.  Noted that although the treatment option of TMS was discussed in the past, she was not interested.  She is willing to have another appointment with her therapist.    # obstructive sleep apnea # fatigue Unchanged. Reviewed sleep study on 03/2020. AHI 6.9. Recommended weight loss, CPAP, oral surgery or appliance.  She has been followed by her clinic, and is awaiting to get CPAP machine.    Plan Start nortriptyline 25 mg before going to bed Next appointment 1/9 at 1:20 for 20 mins, video - TSH was reportedly checked in late 2021; wnl   Past trials of medication: sertraline, fluoxetine (pruritis), lexapro (weight gain), venlafaxine (leg edema), duloxetine (weakness), quetiapine, Xanax   The patient demonstrates the following risk factors for suicide: Chronic risk factors for suicide include: psychiatric disorder of depression. Acute risk factors for suicide include: loss (financial, interpersonal, professional). Protective factors for this patient include: coping skills and hope for the future. Considering these factors, the overall suicide risk at this point appears to be low. Patient is appropriate for outpatient follow up.        [name], MD 03/15/2021, 1:59 PM

## 2021-03-11 ENCOUNTER — Emergency Department (HOSPITAL_COMMUNITY)
Admission: EM | Admit: 2021-03-11 | Discharge: 2021-03-11 | Disposition: A | Discharge status: Home / Self Care | Payer: BC Managed Care – PPO | Attending: Emergency Medicine | Admitting: Emergency Medicine

## 2021-03-11 ENCOUNTER — Encounter (HOSPITAL_COMMUNITY): Payer: Self-pay | Admitting: Emergency Medicine

## 2021-03-11 ENCOUNTER — Emergency Department (HOSPITAL_COMMUNITY): Payer: BC Managed Care – PPO

## 2021-03-11 DIAGNOSIS — I10 Essential (primary) hypertension: Secondary | ICD-10-CM | POA: Insufficient documentation

## 2021-03-11 DIAGNOSIS — W231XXA Caught, crushed, jammed, or pinched between stationary objects, initial encounter: Secondary | ICD-10-CM | POA: Insufficient documentation

## 2021-03-11 DIAGNOSIS — S60221A Contusion of right hand, initial encounter: Secondary | ICD-10-CM | POA: Diagnosis not present

## 2021-03-11 DIAGNOSIS — Z7952 Long term (current) use of systemic steroids: Secondary | ICD-10-CM | POA: Insufficient documentation

## 2021-03-11 DIAGNOSIS — S60111A Contusion of right thumb with damage to nail, initial encounter: Secondary | ICD-10-CM

## 2021-03-11 DIAGNOSIS — Z87891 Personal history of nicotine dependence: Secondary | ICD-10-CM | POA: Diagnosis not present

## 2021-03-11 DIAGNOSIS — Z91040 Latex allergy status: Secondary | ICD-10-CM | POA: Insufficient documentation

## 2021-03-11 DIAGNOSIS — S6991XA Unspecified injury of right wrist, hand and finger(s), initial encounter: Secondary | ICD-10-CM | POA: Diagnosis not present

## 2021-03-11 DIAGNOSIS — J45909 Unspecified asthma, uncomplicated: Secondary | ICD-10-CM | POA: Insufficient documentation

## 2021-03-11 DIAGNOSIS — Z79899 Other long term (current) drug therapy: Secondary | ICD-10-CM | POA: Insufficient documentation

## 2021-03-11 MED ORDER — IBUPROFEN 400 MG PO TABS
400.0000 mg | ORAL_TABLET | Freq: Once | ORAL | Status: AC
  Administered 2021-03-11: 400 mg via ORAL
  Filled 2021-03-11: qty 1

## 2021-03-11 NOTE — ED Notes (Signed)
Telfa and gauze applied to thumb

## 2021-03-11 NOTE — ED Provider Notes (Signed)
[facility] EMERGENCY DEPARTMENT Provider Note   CSN: [id number] Arrival date & time: 03/11/21  0348     History Chief Complaint  Patient presents with   Hand Injury    Rebecca Boyd is a 45 y.o. female.  The history is provided by the patient.  Hand Injury She has history of hypertension and comes in after accidentally closing a car door on her right hand.  She is complaining of pain diffusely through the hand.  Injury occurred about 12 hours ago.  She has taken acetaminophen without relief.  She denies other injury.   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   Initial dx is 05/2013    Hypertension    Migraine    Migraines    Miscarriage 2009   Neck pain    Nondisplaced fracture of fifth metatarsal bone, left foot, subsequent encounter for fracture with routine healing 08/15/18 09/25/2018   Pruritus 09/29/2018   Pruritus ani 09/25/2018   Rash and nonspecific skin eruption 05/14/2017   Vaginitis    cyctitis    Patient Active Problem List   Diagnosis Date Noted   Encounter for screening colonoscopy 02/18/2021   FH: colon cancer 02/18/2021   Acute vaginitis 10/23/2020   Numbness and tingling of left hand 06/08/2020   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   Asthma 10/29/2019   Family history of lung cancer    Rash and nonspecific skin eruption 05/14/2017   Chronic migraine 12/06/2016   Obesity (BMI 30.0-34.9) 03/17/2015   Essential hypertension 01/07/2014   Pain in thoracic spine 08/07/2013   Low  back pain with radiation 07/30/2013   Vitamin D deficiency 10/27/2010   Allergic rhinitis 12/01/2008   Neck pain 07/04/2007    Past Surgical History:  Procedure Laterality Date   cervical cryotherapy N/A 1999   DILITATION & CURRETTAGE/HYSTROSCOPY WITH THERMACHOICE ABLATION  08/26/2013   Procedure: DILATATION & CURETTAGE/HYSTEROSCOPY WITH THERMACHOICE ENDOMETRIAL ABLATION Procedure #2 Total Therapy Time=min       sec;  Surgeon: Jonnie Kind, MD;  Location: [facility];  Service: Gynecology;;   LAPAROSCOPIC BILATERAL SALPINGECTOMY Bilateral 08/26/2013   Procedure: LAPAROSCOPIC BILATERAL SALPINGECTOMY AND REMOVAL OF LEFT PERITUBAL CYST Procedure #1;  Surgeon: Jonnie Kind, MD;  Location: [facility];  Service: Gynecology;  Laterality: Bilateral;   LAPAROSCOPIC LYSIS OF ADHESIONS  08/26/2013   Procedure: LAPAROSCOPIC LYSIS OF ADHESIONS Procedure #1;  Surgeon: Jonnie Kind, MD;  Location: [facility];  Service: Gynecology;;   VULVAR LESION REMOVAL  08/26/2013   Procedure: REMOVAL OF VULVAR SEBACEOUS CYST Procedure #3;  Surgeon: Jonnie Kind, MD;  Location: [facility];  Service: Gynecology;;     OB History   No obstetric history on file.     Family History  Problem Relation Age of Onset   Depression Mother    Drug abuse Mother    Hypertension Father    Breast cancer Sister  dx 39   Hypertension Maternal Grandmother    Hypertension Maternal Grandfather    Lung cancer Maternal Grandfather    Hypertension Paternal Grandmother    Breast cancer Paternal Grandmother        dx late 83s   Colon cancer Maternal Aunt        62   Colon cancer Cousin 4       maternal    Social History   Tobacco Use   Smoking status: Former    Packs/day: 0.00    Years: 1.00    Pack years: 0.00    Types: Cigars, Cigarettes    Quit date: 01/05/2017    Years since quitting: 4.1   Smokeless tobacco: Never  Vaping Use   Vaping Use: Never used  Substance Use Topics   Alcohol use: Yes    Comment: occasional    Drug use: No    Home Medications Prior to Admission medications   Medication Sig Start Date End Date Taking? Authorizing Provider  ALBUTEROL SULFATE HFA IN Inhale 2 puffs into the lungs as needed.    [provider]  amLODipine (NORVASC) 10 MG tablet Take 1 tablet (10 mg total) by mouth daily. 02/08/21   Fayrene Helper, MD  betamethasone dipropionate 0.05 % cream Apply topically 2 (two) times daily. 06/14/20   Fayrene Helper, MD  Erenumab-aooe (AIMOVIG) 70 MG/ML SOAJ Inject 70 mg into the skin every 30 (thirty) days. 08/12/19   Suzzanne Cloud, NP  ergocalciferol (VITAMIN D2) 1.25 MG (50000 UT) capsule Take 1 capsule (50,000 Units total) by mouth once a week. One capsule once weekly 03/22/20   Fayrene Helper, MD  imipramine (TOFRANIL) 25 MG tablet Take 25 mg by mouth at bedtime. 09/26/20   [provider]  metroNIDAZOLE (METROGEL) 0.75 % vaginal gel USE 1 APPLICATORFUL VAGINALLY 2 TIMES A DAY Patient taking differently: as needed. 02/08/21   Fayrene Helper, MD  montelukast (SINGULAIR) 10 MG tablet Take 1 tablet (10 mg total) by mouth at bedtime. Patient taking differently: Take 10 mg by mouth as needed. 08/17/20   Fayrene Helper, MD  oxyCODONE-acetaminophen (PERCOCET/ROXICET) 5-325 MG tablet Take 1-2 tablets by mouth 2 (two) times daily as needed.  03/10/20   [provider]  polyethylene glycol-electrolytes (NULYTELY) 420 g solution As directed 02/18/21   Eloise Harman, DO  propranolol ER (INDERAL LA) 60 MG 24 hr capsule Take 1 capsule (60 mg total) by mouth daily. 11/23/20   Noreene Larsson, NP  SUMAtriptan Dellis Filbert) 6 MG/0.5ML SOLN injection Use one injection at onset of migraine.  May repeat in 2 hrs, if needed.  Max dose: 2 inj/day. This is a 30 day prescription. 08/12/19   Suzzanne Cloud, NP  topiramate (TOPAMAX) 100 MG tablet Take 1 tablet (100 mg total) by mouth 2 (two) times daily. 08/12/19   Suzzanne Cloud, NP  Vitamin D,  Ergocalciferol, (DRISDOL) 1.25 MG (50000 UNIT) CAPS capsule Take 1 capsule (50,000 Units total) by mouth every 7 (seven) days. 10/22/20   Fayrene Helper, MD  fluticasone (FLONASE) 50 MCG/ACT nasal spray Place 2 sprays into both nostrils daily. 04/10/19 07/08/19  Perlie Mayo, NP    Allergies    Diphenhydramine hcl, Orange fruit, and Latex  Review of Systems   Review of Systems  All other systems reviewed and are negative.  Physical Exam Updated Vital Signs BP (!) 151/98 (BP Location: Left Arm)   Pulse (!) 102   Temp  98.3 F (36.8 C) (Oral)   Resp 17   Ht 5' (1.524 m)   Wt 83.9 kg   LMP 02/12/2021 (Approximate)   SpO2 100%   BMI 36.13 kg/m   Physical Exam Vitals and nursing note reviewed.  45 year old female, resting comfortably and in no acute distress. Vital signs are significant for elevated blood pressure and borderline elevated heart rate. Oxygen saturation is 100%, which is normal. Head is normocephalic and atraumatic. PERRLA, EOMI. Oropharynx is clear. Neck is nontender and supple without adenopathy or JVD. Back is nontender and there is no CVA tenderness. Lungs are clear without rales, wheezes, or rhonchi. Chest is nontender. Heart has regular rate and rhythm without murmur. Abdomen is soft, flat, nontender. Extremities: No obvious deformity of the right hand.  Subungual hematoma noted of the right thumb involving less than one third of the nailbed.  There is tenderness to palpation rather diffusely without any point tenderness.  Capillary refill is prompt throughout and sensation is intact throughout. Skin is warm and dry without rash. Neurologic: Mental status is normal, cranial nerves are intact, moves all extremities equally.  ED Results / Procedures / Treatments    Radiology DG Hand Complete Right  Result Date: 03/11/2021 CLINICAL DATA:  Index and thumb pain after injury with cart or. EXAM: RIGHT HAND - COMPLETE 3+ VIEW COMPARISON:  12/10/2003. FINDINGS:  There is no evidence of fracture or dislocation. There is no evidence of arthropathy or other focal bone abnormality. Soft tissues are unremarkable. IMPRESSION: Negative. Electronically Signed   By: Jorje Guild M.D.   On: 03/11/2021 04:44    Procedures .Marland KitchenIncision and Drainage  Date/Time: 03/11/2021 5:02 AM Performed by: Delora Fuel, MD Authorized by: Delora Fuel, MD   Consent:    Consent obtained:  Verbal   Consent given by:  Patient   Risks, benefits, and alternatives were discussed: yes     Risks discussed:  Incomplete drainage and bleeding   Alternatives discussed:  No treatment Universal protocol:    Procedure explained and questions answered to patient or proxy's satisfaction: yes     Relevant documents present and verified: yes     Test results available : yes     Imaging studies available: yes     Required blood products, implants, devices, and special equipment available: yes     Site/side marked: yes     Immediately prior to procedure, a time out was called: yes     Patient identity confirmed:  Verbally with patient and arm band Location:    Type:  Subungual hematoma   Size:  0.5 cm   Location:  Upper extremity   Upper extremity location:  Finger   Finger location:  R thumb Pre-procedure details:    Skin preparation:  Chlorhexidine Sedation:    Sedation type:  None Anesthesia:    Anesthesia method:  None Procedure type:    Complexity:  Simple Procedure details:    Ultrasound guidance: no     Needle aspiration: no     Incision types:  Single straight   Incision depth:  Subungual   Drainage:  Bloody   Drainage amount:  Scant   Wound treatment:  Wound left open   Packing materials:  None Post-procedure details:    Procedure completion:  Tolerated well, no immediate complications   Medications Ordered in ED Medications  ibuprofen (ADVIL) tablet 400 mg (has no administration in time range)    ED Course  I have reviewed the triage vital  signs and the  nursing notes.  Pertinent imaging results that were available during my care of the patient were reviewed by me and considered in my medical decision making (see chart for details).   MDM Rules/Calculators/A&P                         Crush injury of right hand with small subungual hematoma involving the thumb.  X-rays are ordered.  Old records are reviewed, and she has no relevant past visits.  X-rays show no fracture.  Subungual hematoma is drained with nail trephination.  She is discharged with instructions to apply ice, use over-the-counter NSAIDs and acetaminophen as needed for pain.  Final Clinical Impression(s) / ED Diagnoses Final diagnoses:  Contusion of right hand, initial encounter  Subungual hematoma of right thumb, initial encounter    Rx / DC Orders ED Discharge Orders     None        Delora Fuel, MD 65/80/06 0505

## 2021-03-11 NOTE — ED Triage Notes (Signed)
Pt c/o right hand pain after slamming her hand in the car door about 5pm yesterday afternoon.

## 2021-03-11 NOTE — Discharge Instructions (Addendum)
Apply ice for 30 minutes at a time, 4 times a day.  You may take ibuprofen or naproxen as needed for pain.  To get additional pain relief, add acetaminophen.  Please note that combining acetaminophen with either ibuprofen or naproxen gives you better pain relief than either medication by itself.

## 2021-03-14 ENCOUNTER — Telehealth: Payer: Self-pay | Admitting: *Deleted

## 2021-03-14 ENCOUNTER — Encounter: Payer: Self-pay | Admitting: *Deleted

## 2021-03-14 NOTE — Telephone Encounter (Signed)
Called pt. She has rescheduled to 12/27 at 8:00am. Aware will mail new prep instructions. Sent message to endo making aware.

## 2021-03-14 NOTE — Telephone Encounter (Signed)
-----   Message from Nils Flack sent at 03/14/2021  8:33 AM EST ----- Regarding: Pt forgot about procedure 03/15/21 Good Morning Rebecca Boyd,  This patient called this am to reschedule her procedure that was for tomorrow, stated she forgot about it and hasn't done anything to prep for it. I gave her the office number to call and put her in the depot.  Thank you,  Lorre Nick.

## 2021-03-15 ENCOUNTER — Encounter: Payer: Self-pay | Admitting: Psychiatry

## 2021-03-15 ENCOUNTER — Other Ambulatory Visit: Payer: Self-pay

## 2021-03-15 ENCOUNTER — Telehealth (INDEPENDENT_AMBULATORY_CARE_PROVIDER_SITE_OTHER): Payer: BC Managed Care – PPO | Admitting: Psychiatry

## 2021-03-15 DIAGNOSIS — F330 Major depressive disorder, recurrent, mild: Secondary | ICD-10-CM

## 2021-03-15 DIAGNOSIS — F411 Generalized anxiety disorder: Secondary | ICD-10-CM | POA: Diagnosis not present

## 2021-03-15 MED ORDER — NORTRIPTYLINE HCL 25 MG PO CAPS: 25.0000 mg | ORAL_CAPSULE | Freq: Every day | ORAL | 1 refills | Status: DC

## 2021-03-15 NOTE — Patient Instructions (Signed)
Start nortriptyline 25 mg before going to bed Next appointment 1/9 at 1:20

## 2021-03-16 DIAGNOSIS — M545 Low back pain, unspecified: Secondary | ICD-10-CM | POA: Diagnosis not present

## 2021-03-16 DIAGNOSIS — M546 Pain in thoracic spine: Secondary | ICD-10-CM | POA: Diagnosis not present

## 2021-03-16 DIAGNOSIS — I10 Essential (primary) hypertension: Secondary | ICD-10-CM | POA: Diagnosis not present

## 2021-03-16 DIAGNOSIS — Z79891 Long term (current) use of opiate analgesic: Secondary | ICD-10-CM | POA: Diagnosis not present

## 2021-03-16 DIAGNOSIS — M542 Cervicalgia: Secondary | ICD-10-CM | POA: Diagnosis not present

## 2021-03-28 ENCOUNTER — Telehealth: Payer: Self-pay | Admitting: Orthopedic Surgery

## 2021-03-28 ENCOUNTER — Encounter: Payer: Self-pay | Admitting: Orthopedic Surgery

## 2021-03-28 NOTE — Telephone Encounter (Signed)
Called patient; relayed. Patient states will try returning to full duty work; note issued - aware ready for pickup; Also scheduled her appointment to discuss schedule of surgery.

## 2021-03-28 NOTE — Telephone Encounter (Signed)
Patient called, relays that her employer is asking her if she needs to continue the light duty work/no lifting over 5 pounds, no use of pressure hose, per note issued 12/20/20, until surgery, which patient said will be after the first of the year. I offered appointment also. Please advise regarding work note.

## 2021-03-28 NOTE — Telephone Encounter (Signed)
She requested note, if she wants to continue, ok if not she can get note for full duty up to her, thanks.

## 2021-03-30 ENCOUNTER — Other Ambulatory Visit: Payer: Self-pay

## 2021-03-30 ENCOUNTER — Ambulatory Visit (INDEPENDENT_AMBULATORY_CARE_PROVIDER_SITE_OTHER): Payer: BC Managed Care – PPO | Admitting: Nurse Practitioner

## 2021-03-30 ENCOUNTER — Encounter: Payer: Self-pay | Admitting: Nurse Practitioner

## 2021-03-30 VITALS — BP 139/93 | HR 85 | Ht 60.0 in | Wt 185.0 lb

## 2021-03-30 DIAGNOSIS — R1084 Generalized abdominal pain: Secondary | ICD-10-CM

## 2021-03-30 DIAGNOSIS — I10 Essential (primary) hypertension: Secondary | ICD-10-CM

## 2021-03-30 DIAGNOSIS — R748 Abnormal levels of other serum enzymes: Secondary | ICD-10-CM | POA: Diagnosis not present

## 2021-03-30 DIAGNOSIS — K921 Melena: Secondary | ICD-10-CM

## 2021-03-30 DIAGNOSIS — E669 Obesity, unspecified: Secondary | ICD-10-CM | POA: Diagnosis not present

## 2021-03-30 DIAGNOSIS — E66811 Obesity, class 1: Secondary | ICD-10-CM

## 2021-03-30 NOTE — Progress Notes (Signed)
   Rebecca Boyd     MRN: [id number]      DOB: Dec 26, 1975   HPI Rebecca Boyd is here for c/o of constant sharp, achy stomach pain started 3 days ago, has pain on the left side and the middle upper part of her abdomen, never had this type of pain before .Pain is a 7/8. Eating does not makes her pain worse. A little Nausea, no vomiting, diarrhea, no constipation, no bloody stool, no urinary complaints. Denies acid reflux, flatulence. Pain does not feel like menstrual pain, has colonoscopy coming up in Dec.   Peptobismol did not work  ROS Denies recent fever or chills. Denies sinus pressure, nasal congestion, ear pain or sore throat. Denies chest congestion, productive cough or wheezing. Denies chest pains, palpitations and leg swelling has abdominal pain, nausea. No vomiting,diarrhea or constipation.   Denies dysuria, frequency, hesitancy or incontinence. Denies joint pain, swelling and limitation in mobility. Denies headaches, seizures, numbness, or tingling. Denies depression, anxiety or insomnia. Denies skin break down or rash.   PE  BP (!) 139/93   Pulse 85   Ht 5' (1.524 m)   Wt 185 lb 0.6 oz (83.9 kg)   SpO2 97%   BMI 36.14 kg/m   Patient alert and oriented and in no cardiopulmonary distress.  HEENT: No facial asymmetry, EOMI,     Neck supple .  Chest: Clear to auscultation bilaterally.  CVS: S1, S2 no murmurs, no S3.Regular rate.  ABD: Soft, Generalized tenderness, epigastric area and LUQ more tender.     Ext: No edema  MS: Adequate ROM spine, shoulders, hips and knees.  Skin: Intact, no ulcerations or rash noted.  Psych: Good eye contact, normal affect. Memory intact not anxious or depressed appearing.  CNS: CN 2-12 intact, power,  normal throughout.no focal deficits noted.   Assessment & Plan

## 2021-03-30 NOTE — Assessment & Plan Note (Signed)
Well controlled 

## 2021-03-30 NOTE — Assessment & Plan Note (Signed)
Importance of regular exercise 30 minutes 5 times a week discussed. Portion control and healthy eating discussed.

## 2021-03-30 NOTE — Assessment & Plan Note (Signed)
Get Abdominal ultrasound Labs ordered.  PRN medications as needed.

## 2021-03-30 NOTE — Patient Instructions (Signed)
Please get your labs done today .  Abdominal ultrasound ordered as well.   It is important that you exercise regularly at least 30 minutes 5 times a week.  Think about what you will eat, plan ahead. Choose " clean, green, fresh or frozen" over canned, processed or packaged foods which are more sugary, salty and fatty. 70 to 75% of food eaten should be vegetables and fruit. Three meals at set times with snacks allowed between meals, but they must be fruit or vegetables. Aim to eat over a 12 hour period , example 7 am to 7 pm, and STOP after  your last meal of the day. Drink water,generally about 64 ounces per day, no other drink is as healthy. Fruit juice is best enjoyed in a healthy way, by EATING the fruit.  Thanks for choosing [facility], we consider it a privelige to serve you.

## 2021-03-31 ENCOUNTER — Encounter: Payer: Self-pay | Admitting: *Deleted

## 2021-03-31 DIAGNOSIS — K921 Melena: Secondary | ICD-10-CM | POA: Insufficient documentation

## 2021-03-31 DIAGNOSIS — R748 Abnormal levels of other serum enzymes: Secondary | ICD-10-CM | POA: Insufficient documentation

## 2021-03-31 LAB — CMP14+EGFR
ALT: 19 IU/L (ref 0–32)
AST: 16 IU/L (ref 0–40)
Albumin/Globulin Ratio: 1.8 (ref 1.2–2.2)
Albumin: 4.7 g/dL (ref 3.8–4.8)
Alkaline Phosphatase: 120 IU/L (ref 44–121)
BUN/Creatinine Ratio: 11 (ref 9–23)
BUN: 8 mg/dL (ref 6–24)
Bilirubin Total: 0.3 mg/dL (ref 0.0–1.2)
CO2: 23 mmol/L (ref 20–29)
Calcium: 9.8 mg/dL (ref 8.7–10.2)
Chloride: 104 mmol/L (ref 96–106)
Creatinine, Ser: 0.75 mg/dL (ref 0.57–1.00)
Globulin, Total: 2.6 g/dL (ref 1.5–4.5)
Glucose: 93 mg/dL (ref 70–99)
Potassium: 3.6 mmol/L (ref 3.5–5.2)
Sodium: 142 mmol/L (ref 134–144)
Total Protein: 7.3 g/dL (ref 6.0–8.5)
eGFR: 100 mL/min/{1.73_m2} (ref >59)

## 2021-03-31 LAB — CBC
Hematocrit: 41.5 % (ref 34.0–46.6)
Hemoglobin: 14.1 g/dL (ref 11.1–15.9)
MCH: 29.6 pg (ref 26.6–33.0)
MCHC: 34 g/dL (ref 31.5–35.7)
MCV: 87 fL (ref 79–97)
Platelets: 274 10*3/uL (ref 150–450)
RBC: 4.77 x10E6/uL (ref 3.77–5.28)
RDW: 12.6 % (ref 11.7–15.4)
WBC: 6.5 10*3/uL (ref 3.4–10.8)

## 2021-03-31 LAB — LIPID PANEL
Chol/HDL Ratio: 3.7 ratio (ref 0.0–4.4)
Cholesterol, Total: 193 mg/dL (ref 100–199)
HDL: 52 mg/dL (ref >39)
LDL Chol Calc (NIH): 123 mg/dL — ABNORMAL HIGH (ref 0–99)
Triglycerides: 98 mg/dL (ref 0–149)
VLDL Cholesterol Cal: 18 mg/dL (ref 5–40)

## 2021-03-31 LAB — LIPASE: Lipase: 88 U/L — ABNORMAL HIGH (ref 14–72)

## 2021-03-31 NOTE — Assessment & Plan Note (Signed)
PT called today reporting black stools. PT told to come to the office to get hemoccult cards. If positive, I will do urgent GI referral. If she continues to have black stools will send to the ER.

## 2021-03-31 NOTE — Assessment & Plan Note (Signed)
Lab Results  Component Value Date   LIPASE 88 (H) 03/30/2021  Abdominal US pending.  Percocet PRN pain

## 2021-04-01 ENCOUNTER — Telehealth: Payer: Self-pay | Admitting: *Deleted

## 2021-04-01 NOTE — Telephone Encounter (Signed)
-----   Message from Renee Rival, FNP sent at 03/31/2021  8:43 PM EST ----- Please call pt and follow up on her complaints of black stools, did she come to get the hemoccult cards? if she is still having abdominal pain and passing black stools she should go to the ER. Will cancel the abdominal ultrasound because it is not indicated at this time.

## 2021-04-01 NOTE — Telephone Encounter (Signed)
Called and spoke with patient, she was not able to get up here yesterday to pick up the Hemoccult cards. She is still having abdominal pain and passing black stools. Patient advised to go to ER to be further evaluated per [facility] instructions. Patient will head to ER.

## 2021-04-04 ENCOUNTER — Ambulatory Visit (HOSPITAL_COMMUNITY)
Admission: RE | Admit: 2021-04-04 | Discharge: 2021-04-04 | Disposition: A | Discharge status: Home / Self Care | Payer: BC Managed Care – PPO | Source: Ambulatory Visit | Attending: Nurse Practitioner | Admitting: Nurse Practitioner

## 2021-04-04 ENCOUNTER — Other Ambulatory Visit: Payer: Self-pay | Admitting: Nurse Practitioner

## 2021-04-04 ENCOUNTER — Other Ambulatory Visit: Payer: Self-pay

## 2021-04-04 DIAGNOSIS — R1084 Generalized abdominal pain: Secondary | ICD-10-CM

## 2021-04-04 DIAGNOSIS — N281 Cyst of kidney, acquired: Secondary | ICD-10-CM | POA: Diagnosis not present

## 2021-04-04 DIAGNOSIS — K760 Fatty (change of) liver, not elsewhere classified: Secondary | ICD-10-CM

## 2021-04-05 ENCOUNTER — Encounter: Payer: Self-pay | Admitting: *Deleted

## 2021-04-12 ENCOUNTER — Telehealth: Payer: Self-pay | Admitting: Internal Medicine

## 2021-04-12 NOTE — Telephone Encounter (Signed)
Pt wants to reschedule her colonoscopy on 12/27 with Dr Abbey Chatters to another day. [phone]

## 2021-04-12 NOTE — Telephone Encounter (Signed)
Called pt. Aware will call once we receive Jan schedule. She was scheduled for Dr. Abbey Chatters 12/27.

## 2021-04-13 ENCOUNTER — Encounter: Payer: Self-pay | Admitting: Nurse Practitioner

## 2021-04-14 NOTE — Telephone Encounter (Signed)
Called pt;Marland Kitchen She has been rescheduled to 1/10 at 11:30am. Aware will mail new prep instructions. She already has prep at home.

## 2021-04-23 ENCOUNTER — Encounter (HOSPITAL_COMMUNITY): Payer: Self-pay | Admitting: *Deleted

## 2021-04-23 ENCOUNTER — Emergency Department (HOSPITAL_COMMUNITY)
Admission: EM | Admit: 2021-04-23 | Discharge: 2021-04-23 | Disposition: A | Discharge status: Home / Self Care | Payer: BC Managed Care – PPO | Attending: Emergency Medicine | Admitting: Emergency Medicine

## 2021-04-23 ENCOUNTER — Emergency Department (HOSPITAL_COMMUNITY): Payer: BC Managed Care – PPO

## 2021-04-23 DIAGNOSIS — I10 Essential (primary) hypertension: Secondary | ICD-10-CM | POA: Insufficient documentation

## 2021-04-23 DIAGNOSIS — M5459 Other low back pain: Secondary | ICD-10-CM | POA: Diagnosis not present

## 2021-04-23 DIAGNOSIS — Z87891 Personal history of nicotine dependence: Secondary | ICD-10-CM | POA: Diagnosis not present

## 2021-04-23 DIAGNOSIS — K6389 Other specified diseases of intestine: Secondary | ICD-10-CM | POA: Diagnosis not present

## 2021-04-23 DIAGNOSIS — R109 Unspecified abdominal pain: Secondary | ICD-10-CM | POA: Insufficient documentation

## 2021-04-23 DIAGNOSIS — I878 Other specified disorders of veins: Secondary | ICD-10-CM | POA: Diagnosis not present

## 2021-04-23 DIAGNOSIS — Z79899 Other long term (current) drug therapy: Secondary | ICD-10-CM | POA: Diagnosis not present

## 2021-04-23 DIAGNOSIS — N281 Cyst of kidney, acquired: Secondary | ICD-10-CM | POA: Diagnosis not present

## 2021-04-23 DIAGNOSIS — M545 Low back pain, unspecified: Secondary | ICD-10-CM

## 2021-04-23 DIAGNOSIS — K3189 Other diseases of stomach and duodenum: Secondary | ICD-10-CM | POA: Diagnosis not present

## 2021-04-23 DIAGNOSIS — Z91040 Latex allergy status: Secondary | ICD-10-CM | POA: Diagnosis not present

## 2021-04-23 LAB — URINALYSIS, ROUTINE W REFLEX MICROSCOPIC
Bilirubin Urine: NEGATIVE
Glucose, UA: NEGATIVE mg/dL
Ketones, ur: NEGATIVE mg/dL
Leukocytes,Ua: NEGATIVE
Nitrite: NEGATIVE
Protein, ur: NEGATIVE mg/dL
Specific Gravity, Urine: 1.02 (ref 1.005–1.030)
pH: 6 (ref 5.0–8.0)

## 2021-04-23 LAB — URINALYSIS, MICROSCOPIC (REFLEX): Bacteria, UA: NONE SEEN

## 2021-04-23 LAB — PREGNANCY, URINE: Preg Test, Ur: NEGATIVE

## 2021-04-23 MED ORDER — KETOROLAC TROMETHAMINE 30 MG/ML IJ SOLN
15.0000 mg | Freq: Once | INTRAMUSCULAR | Status: AC
  Administered 2021-04-23: 16:00:00 15 mg via INTRAMUSCULAR

## 2021-04-23 MED ORDER — CYCLOBENZAPRINE HCL 10 MG PO TABS: 10.0000 mg | ORAL_TABLET | Freq: Two times a day (BID) | ORAL | 0 refills | Status: DC | PRN

## 2021-04-23 MED ORDER — NAPROXEN 375 MG PO TABS: 375.0000 mg | ORAL_TABLET | Freq: Two times a day (BID) | ORAL | 0 refills | Status: DC

## 2021-04-23 MED ORDER — KETOROLAC TROMETHAMINE 15 MG/ML IJ SOLN
15.0000 mg | Freq: Once | INTRAMUSCULAR | Status: DC
  Filled 2021-04-23: qty 1

## 2021-04-23 MED ORDER — CYCLOBENZAPRINE HCL 10 MG PO TABS
10.0000 mg | ORAL_TABLET | Freq: Once | ORAL | Status: AC
  Administered 2021-04-23: 16:00:00 10 mg via ORAL
  Filled 2021-04-23: qty 1

## 2021-04-23 NOTE — ED Notes (Signed)
Pt returned from CT °

## 2021-04-23 NOTE — ED Notes (Signed)
Patient transported to CT 

## 2021-04-23 NOTE — ED Provider Notes (Signed)
[facility] EMERGENCY DEPARTMENT Provider Note   CSN: [id number] Arrival date & time: 04/23/21  1400     History Chief Complaint  Patient presents with   Flank Pain    Rebecca Boyd is a 45 y.o. female with medical history significant for anemia and anxiety.  Patient presents due to low right-sided back pain since Thursday.  Patient denies radiation or trauma.  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  Patient has not attempted to alleviate symptoms utilizing any medications.  Patient endorses lower right-sided back pain and states her urine had an appearance of blood in it on Friday. Patient is currently on her menstrual period.  Patient denies groin numbness, bowel or bladder dysfunction, lower extremity weakness, abdominal pain, urinary symptoms.   Flank Pain Pertinent negatives include no abdominal pain.      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   Initial dx is 05/2013    Hypertension    Migraine    Migraines    Miscarriage 2009   Neck pain    Nondisplaced fracture of fifth metatarsal bone, left foot, subsequent encounter for fracture with routine healing 08/15/18 09/25/2018   Pruritus 09/29/2018   Pruritus ani 09/25/2018   Rash and nonspecific skin eruption 05/14/2017   Vaginitis    cyctitis    Patient Active Problem List   Diagnosis Date Noted   Black stools 03/31/2021   Elevated lipase 03/31/2021   Generalized abdominal pain 03/30/2021    Encounter for screening colonoscopy 02/18/2021   FH: colon cancer 02/18/2021   Acute vaginitis 10/23/2020   Numbness and tingling of left hand 06/08/2020   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   Asthma 10/29/2019   Family history of lung cancer    Rash and nonspecific skin eruption 05/14/2017   Chronic migraine 12/06/2016   Obesity (BMI 30.0-34.9) 03/17/2015   Essential hypertension 01/07/2014   Pain in thoracic spine 08/07/2013   Low back pain with radiation 07/30/2013   Vitamin D deficiency 10/27/2010   Allergic rhinitis 12/01/2008   Neck pain 07/04/2007    Past Surgical History:  Procedure Laterality Date   cervical cryotherapy N/A 1999   DILITATION & CURRETTAGE/HYSTROSCOPY WITH THERMACHOICE ABLATION  08/26/2013   Procedure: DILATATION & CURETTAGE/HYSTEROSCOPY WITH THERMACHOICE ENDOMETRIAL ABLATION Procedure #2 Total Therapy Time=min       sec;  Surgeon: Jonnie Kind, MD;  Location: [facility];  Service: Gynecology;;   LAPAROSCOPIC BILATERAL SALPINGECTOMY Bilateral 08/26/2013   Procedure: LAPAROSCOPIC BILATERAL SALPINGECTOMY AND REMOVAL OF LEFT PERITUBAL CYST Procedure #1;  Surgeon: Jonnie Kind, MD;  Location: [facility];  Service: Gynecology;  Laterality: Bilateral;   LAPAROSCOPIC LYSIS OF ADHESIONS  08/26/2013   Procedure: LAPAROSCOPIC LYSIS OF ADHESIONS Procedure #1;  Surgeon: Jonnie Kind, MD;  Location: [facility];  Service: Gynecology;;   VULVAR LESION REMOVAL  08/26/2013   Procedure: REMOVAL OF VULVAR  SEBACEOUS CYST Procedure #3;  Surgeon: Jonnie Kind, MD;  Location: [facility];  Service: Gynecology;;     OB History   No obstetric history on file.     Family History  Problem Relation Age of Onset   Depression Mother    Drug abuse Mother    Hypertension Father    Breast cancer Sister        dx 23   Hypertension Maternal Grandmother    Hypertension Maternal Grandfather    Lung cancer Maternal Grandfather    Hypertension Paternal Grandmother    Breast cancer Paternal  Grandmother        dx late 79s   Colon cancer Maternal Aunt        62   Colon cancer Cousin 29       maternal    Social History   Tobacco Use   Smoking status: Former    Packs/day: 0.00    Years: 1.00    Pack years: 0.00    Types: Cigars, Cigarettes    Quit date: 01/05/2017    Years since quitting: 4.2   Smokeless tobacco: Never  Vaping Use   Vaping Use: Never used  Substance Use Topics   Alcohol use: Yes    Comment: occasional   Drug use: No    Home Medications Prior to Admission medications   Medication Sig Start Date End Date Taking? Authorizing Provider  cyclobenzaprine (FLEXERIL) 10 MG tablet Take 1 tablet (10 mg total) by mouth 2 (two) times daily as needed for muscle spasms. 04/23/21  Yes Azucena Cecil, PA-C  naproxen (NAPROSYN) 375 MG tablet Take 1 tablet (375 mg total) by mouth 2 (two) times daily. 04/23/21  Yes Azucena Cecil, PA-C  ALBUTEROL SULFATE HFA IN Inhale 2 puffs into the lungs as needed.    [provider]  amLODipine (NORVASC) 10 MG tablet Take 1 tablet (10 mg total) by mouth daily. 02/08/21   Fayrene Helper, MD  betamethasone dipropionate 0.05 % cream Apply topically 2 (two) times daily. 06/14/20   Fayrene Helper, MD  Erenumab-aooe (AIMOVIG) 70 MG/ML SOAJ Inject 70 mg into the skin every 30 (thirty) days. 08/12/19   Suzzanne Cloud, NP  ergocalciferol (VITAMIN D2) 1.25 MG (50000 UT) capsule Take 1 capsule (50,000 Units total) by mouth once a week. One capsule once weekly 03/22/20   Fayrene Helper, MD  metroNIDAZOLE (METROGEL) 0.75 % vaginal gel USE 1 APPLICATORFUL VAGINALLY 2 TIMES A DAY Patient taking differently: as needed. 02/08/21   Fayrene Helper, MD  montelukast (SINGULAIR) 10 MG tablet Take 1 tablet (10 mg total) by mouth at bedtime. Patient taking differently: Take 10 mg by mouth as needed. 08/17/20   Fayrene Helper, MD  nortriptyline (PAMELOR) 25 MG capsule Take 1 capsule (25 mg total) by mouth daily.  03/15/21 05/14/21  [name], MD  oxyCODONE-acetaminophen (PERCOCET/ROXICET) 5-325 MG tablet Take 1-2 tablets by mouth 2 (two) times daily as needed.  03/10/20   [provider]  polyethylene glycol-electrolytes (NULYTELY) 420 g solution As directed 02/18/21   Eloise Harman, DO  propranolol ER (INDERAL LA) 60 MG 24 hr capsule Take 1 capsule (60 mg total) by mouth daily. 11/23/20   Noreene Larsson, NP  SUMAtriptan Dellis Filbert) 6 MG/0.5ML SOLN injection Use one injection at onset of migraine.  May repeat in 2 hrs, if needed.  Max dose: 2 inj/day. This is a 30 day prescription. 08/12/19   Suzzanne Cloud, NP  topiramate (TOPAMAX) 100 MG tablet Take 1 tablet (100 mg total) by mouth 2 (two) times daily. 08/12/19   Suzzanne Cloud, NP  Vitamin D, Ergocalciferol, (DRISDOL) 1.25 MG (50000 UNIT) CAPS capsule Take 1 capsule (50,000 Units total) by mouth every 7 (seven) days. 10/22/20   Fayrene Helper, MD  fluticasone (FLONASE) 50 MCG/ACT nasal spray Place 2 sprays into both nostrils daily. 04/10/19 07/08/19  Perlie Mayo, NP    Allergies    Diphenhydramine hcl, Orange fruit, and Latex  Review of Systems   Review of Systems  Gastrointestinal:  Negative for abdominal pain, diarrhea, nausea and vomiting.  Genitourinary:  Positive for flank pain and hematuria. Negative for dysuria, urgency, vaginal bleeding and vaginal discharge.  Musculoskeletal:  Positive for back pain.  Neurological:  Negative for weakness.  All other systems reviewed and are negative.  Physical Exam Updated Vital Signs BP 122/79 (BP Location: Left Arm)    Pulse 83    Temp 98.6 F (37 C) (Oral)    Resp 14    Ht 5' (1.524 m)    Wt 84.9 kg    LMP 04/18/2021 (Approximate)    SpO2 96%    BMI 36.55 kg/m   Physical Exam Vitals and nursing note reviewed.  Constitutional:      General: She is not in acute distress.    Appearance: She is not ill-appearing or toxic-appearing.  HENT:     Head: Normocephalic.     Nose: Nose  normal.     Mouth/Throat:     Mouth: Mucous membranes are moist.  Eyes:     Extraocular Movements: Extraocular movements intact.     Pupils: Pupils are equal, round, and reactive to light.  Cardiovascular:     Rate and Rhythm: Normal rate and regular rhythm.  Pulmonary:     Effort: Pulmonary effort is normal.     Breath sounds: Normal breath sounds. No wheezing.  Abdominal:     General: Abdomen is flat.     Palpations: Abdomen is soft.     Tenderness: There is no abdominal tenderness. There is right CVA tenderness. There is no left CVA tenderness.  Musculoskeletal:     Cervical back: Normal range of motion.  Skin:    General: Skin is warm and dry.     Capillary Refill: Capillary refill takes less than 2 seconds.  Neurological:     General: No focal deficit present.     Mental Status: She is alert and oriented to person, place, and time.     Cranial Nerves: No cranial nerve deficit.     Motor: No weakness.    ED Results / Procedures / Treatments   Labs (all labs ordered are listed, but only abnormal results are displayed) Labs Reviewed  URINALYSIS, ROUTINE W REFLEX MICROSCOPIC - Abnormal; Notable for the following components:      Result Value   Hgb urine dipstick MODERATE (*)    All other components within normal limits  PREGNANCY, URINE  URINALYSIS, MICROSCOPIC (REFLEX)    EKG None  Radiology DG Lumbar Spine 2-3 Views  Result Date: 04/23/2021 CLINICAL DATA:  Right flank and low back pain without trauma EXAM: LUMBAR SPINE - 2-3 VIEW COMPARISON:  10/15/2019 FINDINGS: Five lumbar type vertebral bodies. Sacroiliac joints are symmetric. Maintenance of vertebral body height and alignment. Intervertebral disc heights are maintained. IMPRESSION: No acute osseous abnormality. Electronically Signed   By: Abigail Miyamoto M.D.   On: 04/23/2021 15:56   CT Renal Joaquim Lai  Study  Result Date: 04/23/2021 CLINICAL DATA:  Right flank pain, kidney stone suspected. EXAM: CT ABDOMEN AND  PELVIS WITHOUT CONTRAST TECHNIQUE: Multidetector CT imaging of the abdomen and pelvis was performed following the standard protocol without IV contrast. COMPARISON:  CT June 30, 2013 FINDINGS: Lower chest: Atelectasis for scarring in the bilateral lung bases. Hepatobiliary: Unremarkable noncontrast appearance of the hepatic parenchyma. Gallbladder is unremarkable. No biliary ductal dilation. Pancreas: No pancreatic ductal dilation or evidence of acute inflammation. Spleen: Within normal limits. Adrenals/Urinary Tract: Bilateral adrenal glands appear normal. No hydronephrosis. 3.4 cm right renal cyst. No renal, ureteral or bladder calculi identified. A few left-sided pelvic phleboliths. Urinary bladder is unremarkable for degree of distension. Stomach/Bowel: No enteric contrast was administered. Stomach is distended with ingested material without wall thickening. No pathologic dilation of small or large bowel. The appendix and terminal ileum appear normal. No evidence of acute bowel inflammation. Vascular/Lymphatic: No abdominal aortic aneurysm. No pathologically enlarged abdominal or pelvic lymph nodes. Reproductive: Lobular uterine contour likely reflects leiomyomas. No suspicious adnexal mass. Other: No significant abdominopelvic free fluid. No pneumoperitoneum. Musculoskeletal: No acute or significant osseous findings. IMPRESSION: 1. No acute findings in the abdomen or pelvis. Specifically, no evidence of obstructive uropathy. 2. Lobular uterine contour likely reflects leiomyomas. Electronically Signed   By: Dahlia Bailiff M.D.   On: 04/23/2021 16:11    Procedures Procedures   Medications Ordered in ED Medications  cyclobenzaprine (FLEXERIL) tablet 10 mg (10 mg Oral Given 04/23/21 1531)  ketorolac (TORADOL) 30 MG/ML injection 15 mg (15 mg Intramuscular Given 04/23/21 1534)    ED Course  I have reviewed the triage vital signs and the nursing notes.  Pertinent labs & imaging results that were  available during my care of the patient were reviewed by me and considered in my medical decision making (see chart for details).    MDM Rules/Calculators/A&P                          45 year old female presents with low right-sided back pain since Thursday.  Patient denies any trauma.  Patient denies red flag symptoms  Patient will be assessed utilizing urinalysis, pregnancy test, lumbar x-ray, CT renal study.  No abnormalities noted on plain film x-ray lumbar spine.  Urinalysis revealing of hemoglobin and urine.  Microscopic reflex urinalysis does not show any red blood cells per high-power field however patient does have right-sided CVA tenderness.   Decision was made to evaluate this patient with a CT renal stone study.  No stones or obstruction were noted on CT however 3.4 cm right renal cyst is noted.  Patient pain treated with 50 mg Toradol shot as well as muscle relaxer.  Patient reports cessation of symptoms and pain with medications.  At this time, most likely origin of patient symptoms is low back pain.  I read this patient a prescription for naproxen and muscle relaxer.  I have discussed the patient and her findings and she expresses understanding.  I have advised the patient of her return precautions and she expressed understanding.  I discussed the patient and her case with Dr. Sabra Heck who is in agreement with current plan for discharge.  Patient is stable on discharge  Final Clinical Impression(s) / ED Diagnoses Final diagnoses:  Right flank pain  Acute right-sided low back pain without sciatica    Rx / DC Orders ED Discharge Orders          Ordered  cyclobenzaprine (FLEXERIL) 10 MG tablet  2 times daily PRN        04/23/21 1637    naproxen (NAPROSYN) 375 MG tablet  2 times daily        04/23/21 1637             Lawana Chambers 04/23/21 1646    Noemi Chapel, MD 04/25/21 [phone]

## 2021-04-23 NOTE — Discharge Instructions (Signed)
Return to ED with any new or worsening symptoms such as groin numbness, bowel or bladder dysfunction, lower extremity weakness I have sent the prescriptions into your pharmacy as we discussed.  Please pick these up the earliest convenience Please follow-up with your PCP in the next 7 to 10 days if your low back pain continues Utilize warm compresses and ice packs to dull pain

## 2021-04-23 NOTE — ED Notes (Signed)
ED Provider at bedside. 

## 2021-04-23 NOTE — ED Triage Notes (Signed)
Right flank pain.

## 2021-04-28 ENCOUNTER — Ambulatory Visit: Payer: BC Managed Care – PPO | Admitting: Family Medicine

## 2021-04-29 ENCOUNTER — Ambulatory Visit: Payer: BC Managed Care – PPO | Admitting: Family Medicine

## 2021-05-05 ENCOUNTER — Encounter: Payer: Self-pay | Admitting: Orthopedic Surgery

## 2021-05-05 ENCOUNTER — Other Ambulatory Visit: Payer: Self-pay

## 2021-05-05 ENCOUNTER — Ambulatory Visit (INDEPENDENT_AMBULATORY_CARE_PROVIDER_SITE_OTHER): Payer: BC Managed Care – PPO | Admitting: Orthopedic Surgery

## 2021-05-05 DIAGNOSIS — G5601 Carpal tunnel syndrome, right upper limb: Secondary | ICD-10-CM | POA: Diagnosis not present

## 2021-05-05 DIAGNOSIS — G5602 Carpal tunnel syndrome, left upper limb: Secondary | ICD-10-CM

## 2021-05-05 DIAGNOSIS — G5603 Carpal tunnel syndrome, bilateral upper limbs: Secondary | ICD-10-CM | POA: Diagnosis not present

## 2021-05-05 MED ORDER — NORTRIPTYLINE HCL 10 MG PO CAPS: 10.0000 mg | ORAL_CAPSULE | Freq: Every day | ORAL | 0 refills | Status: DC

## 2021-05-05 MED ORDER — PREDNISONE 10 MG PO TABS: 10.0000 mg | ORAL_TABLET | Freq: Two times a day (BID) | ORAL | 0 refills | Status: DC

## 2021-05-05 NOTE — Progress Notes (Signed)
This is a follow-up visit  This is a 46 year old female who was at a nerve conduction study done only on the left side which confirmed she had carpal tunnel syndrome however she was not ready to do the surgery secondary to constraints at work  She has been treated in the path with splinting, gabapentin, B6, prednisone and now presents with continued symptoms on the left but new symptoms on the right with numbness tingling and wrist pain  No obvious risk factors for carpal tunnel syndrome  After speaking with her about this her right hand symptoms are very similar to the left and I am confirming that she has a right carpal tunnel syndrome as well  She will continue now with a right wrist brace continue her left wrist brace at night  She will take prednisone 10 mg twice a day which worked well in the past we will add or refilled the Pamelor 10 mg at bedtime and she can call us when it gets close to the time that she is ready for surgery.  I told her to give [place] a 2-week window    Meds ordered this encounter  Medications   nortriptyline (PAMELOR) 10 MG capsule    Sig: Take 1 capsule (10 mg total) by mouth at bedtime.    Dispense:  30 capsule    Refill:  0   predniSONE (DELTASONE) 10 MG tablet    Sig: Take 1 tablet (10 mg total) by mouth 2 (two) times daily with a meal.    Dispense:  60 tablet    Refill:  0    Encounter Diagnoses  Name Primary?   Carpal tunnel syndrome of left wrist Yes   Carpal tunnel syndrome of right wrist

## 2021-05-05 NOTE — Progress Notes (Deleted)
BH MD/PA/NP OP Progress Note  05/05/2021 2:21 PM Rebecca Boyd  MRN:  [id number]  Chief Complaint:  HPI: *** Visit Diagnosis: No diagnosis found.  Past Psychiatric History: Please see initial evaluation for full details. I have reviewed the history. No updates at this time.     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   Initial dx is 05/2013    Hypertension    Migraine    Migraines    Miscarriage 2009   Neck pain    Nondisplaced fracture of fifth metatarsal bone, left foot, subsequent encounter for fracture with routine healing 08/15/18 09/25/2018   Pruritus 09/29/2018   Pruritus ani 09/25/2018   Rash and nonspecific skin eruption 05/14/2017   Vaginitis    cyctitis    Past Surgical History:  Procedure Laterality Date   cervical cryotherapy N/A 1999   DILITATION & CURRETTAGE/HYSTROSCOPY WITH THERMACHOICE ABLATION  08/26/2013   Procedure: DILATATION & CURETTAGE/HYSTEROSCOPY WITH THERMACHOICE ENDOMETRIAL ABLATION Procedure #2 Total Therapy Time=min       sec;  Surgeon: Jonnie Kind, MD;  Location: [facility];  Service: Gynecology;;   LAPAROSCOPIC BILATERAL SALPINGECTOMY Bilateral 08/26/2013   Procedure: LAPAROSCOPIC BILATERAL SALPINGECTOMY AND REMOVAL OF LEFT PERITUBAL CYST Procedure #1;  Surgeon: Jonnie Kind, MD;  Location: [facility];  Service: Gynecology;  Laterality: Bilateral;   LAPAROSCOPIC LYSIS OF ADHESIONS  08/26/2013   Procedure: LAPAROSCOPIC LYSIS OF ADHESIONS Procedure #1;  Surgeon:  Jonnie Kind, MD;  Location: [facility];  Service: Gynecology;;   VULVAR LESION REMOVAL  08/26/2013   Procedure: REMOVAL OF VULVAR SEBACEOUS CYST Procedure #3;  Surgeon: Jonnie Kind, MD;  Location: [facility];  Service: Gynecology;;    Family Psychiatric History: Please see initial evaluation for full details. I have reviewed the history. No updates at this time.     Family History:  Family History  Problem Relation Age of Onset   Depression Mother    Drug abuse Mother    Hypertension Father    Breast cancer Sister        dx 50   Hypertension Maternal Grandmother    Hypertension Maternal Grandfather    Lung cancer Maternal Grandfather    Hypertension Paternal Grandmother    Breast cancer Paternal Grandmother        dx late 78s   Colon cancer Maternal Aunt        62   Colon cancer Cousin 45       maternal    Social History:  Social History   Socioeconomic History   Marital status: Single    Spouse name: Not on file   Number of children: Not on file   Years of education: Not on file   Highest education level: Not on file  Occupational History   Not on file  Tobacco Use   Smoking status: Former    Packs/day: 0.00    Years: 1.00  Pack years: 0.00    Types: Cigars, Cigarettes    Quit date: 01/05/2017    Years since quitting: 4.3   Smokeless tobacco: Never  Vaping Use   Vaping Use: Never used  Substance and Sexual Activity   Alcohol use: Yes    Comment: occasional   Drug use: No   Sexual activity: Yes    Birth control/protection: Condom  Other Topics Concern   Not on file  Social History Narrative   Lives home with mother.  Works at [employer].  Education 10th grade/GED.  No children.  Single.     Social Determinants of Health   Financial Resource Strain: Not on file  Food Insecurity: Not on file  Transportation Needs: Not on file  Physical Activity: Not on file  Stress: Not on file  Social Connections: Not on file    Allergies:  Allergies   Allergen Reactions   Diphenhydramine Hcl Anaphylaxis    (Benadryl)   Orange Fruit Hives, Shortness Of Breath, Itching and Other (See Comments)    MAKES IT DIFFICULT TO BREATH   Latex Rash    Metabolic Disorder Labs: Lab Results  Component Value Date   HGBA1C 5.3 03/18/2020   MPG 103 06/10/2018   MPG 108 05/29/2013   No results found for: PROLACTIN Lab Results  Component Value Date   CHOL 193 03/30/2021   TRIG 98 03/30/2021   HDL 52 03/30/2021   CHOLHDL 3.7 03/30/2021   VLDL 19 05/10/2016   LDLCALC 123 (H) 03/30/2021   LDLCALC 122 (H) 10/21/2020   Lab Results  Component Value Date   TSH 1.140 10/21/2020   TSH 0.951 03/18/2020    Therapeutic Level Labs: No results found for: LITHIUM No results found for: VALPROATE No components found for:  CBMZ  Current Medications: Current Outpatient Medications  Medication Sig Dispense Refill   albuterol (VENTOLIN HFA) 108 (90 Base) MCG/ACT inhaler Inhale 2 puffs into the lungs every 6 (six) hours as needed (Wheezing/SOB).     amLODipine (NORVASC) 10 MG tablet Take 1 tablet (10 mg total) by mouth daily. 90 tablet 1   betamethasone dipropionate 0.05 % cream Apply topically 2 (two) times daily. 45 g 0   cyclobenzaprine (FLEXERIL) 10 MG tablet Take 1 tablet (10 mg total) by mouth 2 (two) times daily as needed for muscle spasms. 20 tablet 0   Erenumab-aooe (AIMOVIG) 70 MG/ML SOAJ Inject 70 mg into the skin every 30 (thirty) days. 1 pen 11   montelukast (SINGULAIR) 10 MG tablet Take 1 tablet (10 mg total) by mouth at bedtime. (Patient taking differently: Take 10 mg by mouth daily as needed (allergies).) 30 tablet 3   naproxen (NAPROSYN) 375 MG tablet Take 1 tablet (375 mg total) by mouth 2 (two) times daily. (Patient not taking: Reported on 05/02/2021) 20 tablet 0   nortriptyline (PAMELOR) 10 MG capsule Take 1 capsule (10 mg total) by mouth at bedtime. 30 capsule 0   oxyCODONE-acetaminophen (PERCOCET/ROXICET) 5-325 MG tablet Take 1-2  tablets by mouth 2 (two) times daily as needed for severe pain.     polyethylene glycol-electrolytes (NULYTELY) 420 g solution As directed 4000 mL 0   predniSONE (DELTASONE) 10 MG tablet Take 1 tablet (10 mg total) by mouth 2 (two) times daily with a meal. 60 tablet 0   SUMAtriptan (IMITREX) 6 MG/0.5ML SOLN injection Use one injection at onset of migraine.  May repeat in 2 hrs, if needed.  Max dose: 2 inj/day. This is a 30 day prescription.  5 mL 11   topiramate (TOPAMAX) 100 MG tablet Take 1 tablet (100 mg total) by mouth 2 (two) times daily. 180 tablet 4   No current facility-administered medications for this visit.     Musculoskeletal: Strength & Muscle Tone:  N/A Gait & Station:  N/A Patient leans: N/A  Psychiatric Specialty Exam: Review of Systems  Last menstrual period 04/18/2021.There is no height or weight on file to calculate BMI.  General Appearance: {Appearance:22683}  Eye Contact:  {BHH EYE CONTACT:22684}  Speech:  Clear and Coherent  Volume:  Normal  Mood:  {BHH MOOD:22306}  Affect:  {Affect (PAA):22687}  Thought Process:  Coherent  Orientation:  Full (Time, Place, and Person)  Thought Content: Logical   Suicidal Thoughts:  {ST/HT (PAA):22692}  Homicidal Thoughts:  {ST/HT (PAA):22692}  Memory:  Immediate;   Good  Judgement:  {Judgement (PAA):22694}  Insight:  {Insight (PAA):22695}  Psychomotor Activity:  Normal  Concentration:  Concentration: Good and Attention Span: Good  Recall:  Good  Fund of Knowledge: Good  Language: Good  Akathisia:  No  Handed:  Right  AIMS (if indicated): not done  Assets:  Communication Skills Desire for Improvement  ADL's:  Intact  Cognition: WNL  Sleep:  {BHH GOOD/FAIR/POOR:22877}   Screenings: GAD-7    Flowsheet Row Office Visit from 12/10/2019 in [facility] Office Visit from 10/28/2019 in [facility] Office Visit from 09/25/2019 in [facility] Office Visit from 05/21/2019 in [facility]  Total GAD-7 Score 14 15 6 18       PHQ2-9    [facility] Office Visit from 03/30/2021 in [facility] from 12/28/2020 in [facility] Office Visit from 12/24/2020 in [facility] Video Visit from 12/14/2020 in [facility] Office Visit from 11/23/2020 in [facility]  PHQ-2 Total Score 2 2 2 2 5   PHQ-9 Total Score 4 6 7 7 9       Flowsheet Row ED from 04/23/2021 in [facility] from 03/11/2021 in [facility] from 01/06/2021 in [facility] No Risk No Risk No Risk        Assessment and Plan:  CARLI LEFEVERS is a 46 y.o. year old female with a history of  Depressive Disorder NOS and Psychotic Disorder NOS, hypertension, migraine, who presents for follow up appointment for below.    1. GAD (generalized anxiety disorder) 2. MDD (major depressive disorder), recurrent episode, mild ([place]) She continues to report depressive and anxiety symptoms in the context of loss, including her aunt her friend.  Other psychosocial stressors includes  loss of her maternal uncle, taking care of her mother with COPD, and her sister with breast cancer.  Will start nortriptyline to target anxiety, depression, which may be also helpful for migraine.  Discussed potential risk of palpitation, weight gain and dry mouth.  Noted that although the treatment option of TMS was discussed in the past, she was not interested.  She is willing to have another appointment with her therapist.    # obstructive sleep apnea # fatigue Unchanged. Reviewed sleep study on 03/2020. AHI 6.9. Recommended weight loss, CPAP, oral surgery or appliance.  She has been followed by her clinic, and is awaiting to get CPAP machine.    Plan Start nortriptyline 25 mg before going to bed Next appointment 1/9 at 1:20 for 20 mins, video - TSH was  reportedly checked in  late 2021; wnl   Past trials of medication: sertraline, fluoxetine (pruritis), lexapro (weight gain), venlafaxine (leg edema), duloxetine (weakness), quetiapine, Xanax   The patient demonstrates the following risk factors for suicide: Chronic risk factors for suicide include: psychiatric disorder of depression. Acute risk factors for suicide include: loss (financial, interpersonal, professional). Protective factors for this patient include: coping skills and hope for the future. Considering these factors, the overall suicide risk at this point appears to be low. Patient is appropriate for outpatient follow up.      [name], MD 05/05/2021, 2:21 PM

## 2021-05-05 NOTE — Patient Instructions (Addendum)
Call 2 weeks before ready for surgery and we will schedule by phone   Start these meds :   Meds ordered this encounter  Medications   nortriptyline (PAMELOR) 10 MG capsule    Sig: Take 1 capsule (10 mg total) by mouth at bedtime.    Dispense:  30 capsule    Refill:  0   predniSONE (DELTASONE) 10 MG tablet    Sig: Take 1 tablet (10 mg total) by mouth 2 (two) times daily with a meal.    Dispense:  60 tablet    Refill:  0   Open Carpal Tunnel Release   Open carpal tunnel release is a surgery to relieve symptoms caused by carpal tunnel syndrome. The carpal tunnel is a narrow, hollow space in the wrist. It is located between the wrist bones and a band of connective tissue (transverse carpal ligament, also known as the flexor retinaculum). The nerve that supplies most of the hand (median nerve) passes through the carpal tunnel, and so do tissues that connect bones to muscles (tendons) in the hand and arm. Carpal tunnel syndrome makes this space swell and become narrow. The swelling pinches the median nerve and causes pain and numbness. During carpal tunnel release surgery, the transverse carpal ligament is cut to make more room in the carpal tunnel space. This also lessens the pressure on the median nerve. You may have this surgery if other types of treatment have not relieved your carpal tunnel symptoms. This surgery is usually done only for the hand that you use more often (dominant hand), but it may be done for both hands depending on your symptoms. Tell a health care provider about: Any allergies you have. All medicines you are taking, including vitamins, herbs, eye drops, creams, and over-the-counter medicines. Any problems you or family members have had with anesthetic medicines. Any blood disorders you have. Any surgeries you have had. Any medical conditions you have. Whether you are pregnant or may be pregnant. What are the risks? Generally, this is a safe procedure. However, problems  may occur, including: Infection. Bleeding. Injury to the median nerve. Allergic reactions to medicines. The surgery failing to relieve your symptoms, or making your symptoms worse. What happens before the procedure? Medicines Ask your health care provider about: Changing or stopping your regular medicines. This is especially important if you are taking diabetes medicines or blood thinners. Taking medicines such as aspirin and ibuprofen. These medicines can thin your blood. Do not take these medicines unless your health care provider tells you to take them. Taking over-the-counter medicines, vitamins, herbs, and supplements. You may be given antibiotic medicine to help prevent infection. Staying hydrated Follow instructions from your health care provider about hydration, which may include: Up to 2 hours before the procedure - you may continue to drink clear liquids, such as water, clear fruit juice, black coffee, and plain tea. Eating and drinking restrictions Follow instructions from your health care provider about eating and drinking, which may include: 8 hours before the procedure - stop eating heavy meals or foods such as meat, fried foods, or fatty foods. 6 hours before the procedure - stop eating light meals or foods, such as toast or cereal. 6 hours before the procedure - stop drinking milk or drinks that contain milk. 2 hours before the procedure - stop drinking clear liquids. General instructions Ask your health care provider how your surgical site will be marked or identified. You may be asked to shower with a germ-killing soap. Plan  to have someone take you home from the hospital or clinic. Plan to have a responsible adult care for you for at least 24 hours after you leave the hospital or clinic. This is important. What happens during the procedure? To lower your risk of infection: Your health care team will wash or sanitize their hands. Hair may be removed from the surgical  area. Your arm, hand, and wrist will be cleaned with a germ-killing (antiseptic) solution. An IV will be inserted into one of your veins. You will be given one of the following: A medicine to numb the wrist area (local anesthetic). You may also be given a medicine to help you relax (sedative). A medicine to make you fall asleep (general anesthetic). An incision will be made in your wrist, on the same side as your palm. The skin of your wrist will be spread to expose the transverse carpal ligament. The transverse carpal ligament will be cut to make more room in the carpal tunnel space. Your incision will be closed with stitches (sutures) or staples. A bandage (dressing) will be placed over your wrist and wrapped around your hand and wrist. The procedure may vary among health care providers and hospitals. What happens after the procedure? Your blood pressure, heart rate, breathing rate, and blood oxygen level will be monitored until the medicines you were given have worn off. You will be given pain medicine as needed. A splint or brace may be placed over your dressing, to hold your hand and wrist in place while you heal. Do not drive until your health care provider approves. Summary Carpal tunnel release is a surgery to relieve pain and numbness in the hand caused by swelling around a nerve (carpal tunnel syndrome). You may have this surgery if other types of treatment have not relieved your carpal tunnel symptoms. During carpal tunnel release surgery, a band of connective tissue (transverse carpal ligament) is cut to make more room in the carpal tunnel space. This information is not intended to replace advice given to you by your health care provider. Make sure you discuss any questions you have with your health care provider. Document Revised: 03/30/2017 Document Reviewed: 12/25/2016 Elsevier Patient Education  2020 [employer].

## 2021-05-06 ENCOUNTER — Other Ambulatory Visit (HOSPITAL_COMMUNITY)
Admission: RE | Admit: 2021-05-06 | Discharge: 2021-05-06 | Disposition: A | Discharge status: Home / Self Care | Payer: BC Managed Care – PPO | Source: Ambulatory Visit | Attending: Internal Medicine | Admitting: Internal Medicine

## 2021-05-06 DIAGNOSIS — Z1211 Encounter for screening for malignant neoplasm of colon: Secondary | ICD-10-CM | POA: Diagnosis not present

## 2021-05-06 LAB — PREGNANCY, URINE: Preg Test, Ur: NEGATIVE

## 2021-05-09 ENCOUNTER — Telehealth: Payer: Self-pay | Admitting: Psychiatry

## 2021-05-09 ENCOUNTER — Other Ambulatory Visit: Payer: Self-pay

## 2021-05-09 ENCOUNTER — Telehealth: Payer: BC Managed Care – PPO | Admitting: Psychiatry

## 2021-05-09 NOTE — Telephone Encounter (Signed)
Sent link for video visit through Epic. Patient did not sign in. Called the patient for appointment scheduled today. The patient did not answer the phone. Left voice message to contact the office ([phone]).   ?

## 2021-05-10 ENCOUNTER — Telehealth: Payer: Self-pay | Admitting: Internal Medicine

## 2021-05-10 ENCOUNTER — Ambulatory Visit (HOSPITAL_COMMUNITY): Payer: BC Managed Care – PPO | Admitting: Certified Registered Nurse Anesthetist

## 2021-05-10 ENCOUNTER — Encounter (HOSPITAL_COMMUNITY)
Admission: RE | Disposition: A | Discharge status: Home / Self Care | Payer: Self-pay | Source: Home / Self Care | Attending: Internal Medicine

## 2021-05-10 ENCOUNTER — Ambulatory Visit (HOSPITAL_COMMUNITY)
Admission: RE | Admit: 2021-05-10 | Discharge: 2021-05-10 | Disposition: A | Discharge status: Home / Self Care | Payer: BC Managed Care – PPO | Attending: Internal Medicine | Admitting: Internal Medicine

## 2021-05-10 ENCOUNTER — Encounter (HOSPITAL_COMMUNITY): Payer: Self-pay

## 2021-05-10 ENCOUNTER — Other Ambulatory Visit: Payer: Self-pay

## 2021-05-10 ENCOUNTER — Encounter: Payer: Self-pay | Admitting: Internal Medicine

## 2021-05-10 ENCOUNTER — Encounter (INDEPENDENT_AMBULATORY_CARE_PROVIDER_SITE_OTHER): Payer: Self-pay | Admitting: *Deleted

## 2021-05-10 DIAGNOSIS — I10 Essential (primary) hypertension: Secondary | ICD-10-CM | POA: Insufficient documentation

## 2021-05-10 DIAGNOSIS — D127 Benign neoplasm of rectosigmoid junction: Secondary | ICD-10-CM

## 2021-05-10 DIAGNOSIS — D649 Anemia, unspecified: Secondary | ICD-10-CM | POA: Diagnosis not present

## 2021-05-10 DIAGNOSIS — K648 Other hemorrhoids: Secondary | ICD-10-CM | POA: Insufficient documentation

## 2021-05-10 DIAGNOSIS — J45909 Unspecified asthma, uncomplicated: Secondary | ICD-10-CM | POA: Insufficient documentation

## 2021-05-10 DIAGNOSIS — R519 Headache, unspecified: Secondary | ICD-10-CM | POA: Insufficient documentation

## 2021-05-10 DIAGNOSIS — Z1211 Encounter for screening for malignant neoplasm of colon: Secondary | ICD-10-CM | POA: Diagnosis not present

## 2021-05-10 DIAGNOSIS — Z87891 Personal history of nicotine dependence: Secondary | ICD-10-CM | POA: Insufficient documentation

## 2021-05-10 DIAGNOSIS — Z139 Encounter for screening, unspecified: Secondary | ICD-10-CM | POA: Diagnosis not present

## 2021-05-10 DIAGNOSIS — F418 Other specified anxiety disorders: Secondary | ICD-10-CM | POA: Diagnosis not present

## 2021-05-10 DIAGNOSIS — K635 Polyp of colon: Secondary | ICD-10-CM | POA: Diagnosis not present

## 2021-05-10 DIAGNOSIS — Z800 Family history of malignant neoplasm of digestive organs: Secondary | ICD-10-CM | POA: Insufficient documentation

## 2021-05-10 HISTORY — PX: COLONOSCOPY WITH PROPOFOL: SHX5780

## 2021-05-10 HISTORY — PX: POLYPECTOMY: SHX5525

## 2021-05-10 SURGERY — COLONOSCOPY WITH PROPOFOL
Anesthesia: General

## 2021-05-10 MED ORDER — PROPOFOL 10 MG/ML IV BOLUS
INTRAVENOUS | Status: DC | PRN
  Administered 2021-05-10: 30 mg via INTRAVENOUS
  Administered 2021-05-10: 50 mg via INTRAVENOUS
  Administered 2021-05-10: 100 mg via INTRAVENOUS
  Administered 2021-05-10: 40 mg via INTRAVENOUS

## 2021-05-10 MED ORDER — LIDOCAINE HCL (CARDIAC) PF 100 MG/5ML IV SOSY
PREFILLED_SYRINGE | INTRAVENOUS | Status: DC | PRN
  Administered 2021-05-10: 50 mg via INTRAVENOUS

## 2021-05-10 MED ORDER — LACTATED RINGERS IV SOLN: INTRAVENOUS | Status: DC

## 2021-05-10 NOTE — Discharge Instructions (Addendum)
°  Colonoscopy Discharge Instructions  Read the instructions outlined below and refer to this sheet in the next few weeks. These discharge instructions provide you with general information on caring for yourself after you leave the hospital. Your doctor may also give you specific instructions. While your treatment has been planned according to the most current medical practices available, unavoidable complications occasionally occur.   ACTIVITY You may resume your regular activity, but move at a slower pace for the next 24 hours.  Take frequent rest periods for the next 24 hours.  Walking will help get rid of the air and reduce the bloated feeling in your belly (abdomen).  No driving for 24 hours (because of the medicine (anesthesia) used during the test).   Do not sign any important legal documents or operate any machinery for 24 hours (because of the anesthesia used during the test).  NUTRITION Drink plenty of fluids.  You may resume your normal diet as instructed by your doctor.  Begin with a light meal and progress to your normal diet. Heavy or fried foods are harder to digest and may make you feel sick to your stomach (nauseated).  Avoid alcoholic beverages for 24 hours or as instructed.  MEDICATIONS You may resume your normal medications unless your doctor tells you otherwise.  WHAT YOU CAN EXPECT TODAY Some feelings of bloating in the abdomen.  Passage of more gas than usual.  Spotting of blood in your stool or on the toilet paper.  IF YOU HAD POLYPS REMOVED DURING THE COLONOSCOPY: No aspirin products for 7 days or as instructed.  No alcohol for 7 days or as instructed.  Eat a soft diet for the next 24 hours.  FINDING OUT THE RESULTS OF YOUR TEST Not all test results are available during your visit. If your test results are not back during the visit, make an appointment with your caregiver to find out the results. Do not assume everything is normal if you have not heard from your  caregiver or the medical facility. It is important for you to follow up on all of your test results.  SEEK IMMEDIATE MEDICAL ATTENTION IF: You have more than a spotting of blood in your stool.  Your belly is swollen (abdominal distention).  You are nauseated or vomiting.  You have a temperature over 101.  You have abdominal pain or discomfort that is severe or gets worse throughout the day.   Your colonoscopy revealed 4 polyp(s) which I removed successfully. This were all small and likely benign. Await pathology results, my office will contact you. I recommend repeating colonoscopy in 5-10 years for surveillance purposes.    I hope you have a great rest of your week!  Elon Alas. Abbey Chatters, D.O. Gastroenterology and Hepatology [facility]

## 2021-05-10 NOTE — Anesthesia Preprocedure Evaluation (Signed)
Anesthesia Evaluation  Patient identified by MRN, date of birth, ID band Patient awake    Reviewed: Allergy & Precautions, H&P , NPO status , Patient's Chart, lab work & pertinent test results, reviewed documented beta blocker date and time   Airway Mallampati: II  TM Distance: >3 FB Neck ROM: full    Dental no notable dental hx.    Pulmonary asthma , former smoker,    Pulmonary exam normal breath sounds clear to auscultation       Cardiovascular Exercise Tolerance: Good hypertension, negative cardio ROS   Rhythm:regular Rate:Normal     Neuro/Psych  Headaches, PSYCHIATRIC DISORDERS Anxiety Depression    GI/Hepatic negative GI ROS, Neg liver ROS,   Endo/Other  negative endocrine ROS  Renal/GU negative Renal ROS  negative genitourinary   Musculoskeletal   Abdominal   Peds  Hematology  (+) Blood dyscrasia, anemia ,   Anesthesia Other Findings   Reproductive/Obstetrics negative OB ROS                             Anesthesia Physical Anesthesia Plan  ASA: 2  Anesthesia Plan: General   Post-op Pain Management:    Induction:   PONV Risk Score and Plan: Propofol infusion  Airway Management Planned:   Additional Equipment:   Intra-op Plan:   Post-operative Plan:   Informed Consent: I have reviewed the patients History and Physical, chart, labs and discussed the procedure including the risks, benefits and alternatives for the proposed anesthesia with the patient or authorized representative who has indicated his/her understanding and acceptance.     Dental Advisory Given  Plan Discussed with: CRNA  Anesthesia Plan Comments:         Anesthesia Quick Evaluation

## 2021-05-10 NOTE — Telephone Encounter (Signed)
Phoned an spoke with the pt regarding a work note for her colonoscopy for today and prep day. Note is done and is at the front for the pt to pick up after lunch.

## 2021-05-10 NOTE — Telephone Encounter (Signed)
Please call patient regarding a work note. She had procedure today. [phone]

## 2021-05-10 NOTE — Transfer of Care (Signed)
Immediate Anesthesia Transfer of Care Note  Patient: Rebecca Boyd  Procedure(s) Performed: COLONOSCOPY WITH PROPOFOL POLYPECTOMY  Patient Location: Endoscopy Unit  Anesthesia Type:General  Level of Consciousness: awake, alert  and oriented  Airway & Oxygen Therapy: Patient Spontanous Breathing  Post-op Assessment: Report given to RN and Post -op Vital signs reviewed and stable  Post vital signs: Reviewed and stable  Last Vitals:  Vitals Value Taken Time  BP    Temp    Pulse 88   Resp 13   SpO2 100%     Last Pain:  Vitals:   05/10/21 1011  TempSrc:   PainSc: 0-No pain         Complications: No notable events documented.

## 2021-05-10 NOTE — OR Nursing (Incomplete)
Rebecca Boyd was at [facility] on 05/10/21 for a procedure and cannot return to work until noon on 05/11/21.

## 2021-05-10 NOTE — H&P (Signed)
Primary Care Physician:  Fayrene Helper, MD Primary Gastroenterologist:  Dr. Abbey Chatters  Pre-Procedure History & Physical: HPI:  Rebecca Boyd is a 46 y.o. female is here for a colonoscopy to be performed for colon cancer screening purposes. Maternal cousin with colon cancer at age 20, maternal aunt with stage IV colon cancer diagnosed at age 90.  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   Initial dx is 05/2013    Hypertension    Migraine    Migraines    Miscarriage 2009   Neck pain    Nondisplaced fracture of fifth metatarsal bone, left foot, subsequent encounter for fracture with routine healing 08/15/18 09/25/2018   Pruritus 09/29/2018   Pruritus ani 09/25/2018   Rash and nonspecific skin eruption 05/14/2017   Vaginitis    cyctitis    Past Surgical History:  Procedure Laterality Date   cervical cryotherapy N/A 1999   DILITATION & CURRETTAGE/HYSTROSCOPY WITH THERMACHOICE ABLATION  08/26/2013   Procedure: DILATATION & CURETTAGE/HYSTEROSCOPY WITH THERMACHOICE ENDOMETRIAL ABLATION Procedure #2 Total Therapy Time=min       sec;  Surgeon: Jonnie Kind, MD;  Location: [facility];  Service: Gynecology;;   LAPAROSCOPIC BILATERAL SALPINGECTOMY Bilateral 08/26/2013   Procedure: LAPAROSCOPIC BILATERAL SALPINGECTOMY AND REMOVAL OF LEFT PERITUBAL CYST Procedure #1;  Surgeon: Jonnie Kind, MD;  Location: [facility];  Service: Gynecology;  Laterality: Bilateral;   LAPAROSCOPIC LYSIS OF ADHESIONS  08/26/2013   Procedure: LAPAROSCOPIC  LYSIS OF ADHESIONS Procedure #1;  Surgeon: Jonnie Kind, MD;  Location: [facility];  Service: Gynecology;;   VULVAR LESION REMOVAL  08/26/2013   Procedure: REMOVAL OF VULVAR SEBACEOUS CYST Procedure #3;  Surgeon: Jonnie Kind, MD;  Location: [facility];  Service: Gynecology;;    Prior to Admission medications   Medication Sig Start Date End Date Taking? Authorizing Provider  albuterol (VENTOLIN HFA) 108 (90 Base) MCG/ACT inhaler Inhale 2 puffs into the lungs every 6 (six) hours as needed (Wheezing/SOB).   Yes [provider]  amLODipine (NORVASC) 10 MG tablet Take 1 tablet (10 mg total) by mouth daily. 02/08/21  Yes Fayrene Helper, MD  betamethasone dipropionate 0.05 % cream Apply topically 2 (two) times daily. 06/14/20  Yes Fayrene Helper, MD  cyclobenzaprine (FLEXERIL) 10 MG tablet Take 1 tablet (10 mg total) by mouth 2 (two) times daily as needed for muscle spasms. 04/23/21  Yes Azucena Cecil, PA-C  Erenumab-aooe (AIMOVIG) 70 MG/ML SOAJ Inject 70 mg into the skin every 30 (thirty) days. 08/12/19  Yes Suzzanne Cloud, NP  montelukast (SINGULAIR) 10 MG tablet Take 1 tablet (10 mg total) by mouth at bedtime. Patient taking differently: Take 10 mg by mouth daily as needed (allergies). 08/17/20  Yes Fayrene Helper, MD  nortriptyline (PAMELOR) 10 MG capsule Take 1 capsule (10 mg total) by mouth at bedtime. 05/05/21 06/04/21 Yes Carole Civil, MD  oxyCODONE-acetaminophen (PERCOCET/ROXICET) 5-325 MG tablet Take 1-2  tablets by mouth 2 (two) times daily as needed for severe pain. 03/10/20  Yes [provider]  predniSONE (DELTASONE) 10 MG tablet Take 1 tablet (10 mg total) by mouth 2 (two) times daily with a meal. 05/05/21  Yes Carole Civil, MD  SUMAtriptan (IMITREX) 6 MG/0.5ML SOLN injection Use one injection at onset of migraine.  May repeat in 2 hrs, if needed.  Max dose: 2 inj/day. This is a 30 day prescription. 08/12/19  Yes Suzzanne Cloud, NP  topiramate  (TOPAMAX) 100 MG tablet Take 1 tablet (100 mg total) by mouth 2 (two) times daily. 08/12/19  Yes Suzzanne Cloud, NP  naproxen (NAPROSYN) 375 MG tablet Take 1 tablet (375 mg total) by mouth 2 (two) times daily. Patient not taking: Reported on 05/02/2021 04/23/21   Azucena Cecil, PA-C  polyethylene glycol-electrolytes (NULYTELY) 420 g solution As directed 02/18/21   Eloise Harman, DO  fluticasone [facility]) 50 MCG/ACT nasal spray Place 2 sprays into both nostrils daily. 04/10/19 07/08/19  Perlie Mayo, NP    Allergies as of 02/18/2021 - Review Complete 02/18/2021  Allergen Reaction Noted   Diphenhydramine hcl Anaphylaxis 07/04/2007   Orange fruit Hives, Shortness Of Breath, Itching, and Other (See Comments) 11/24/2010   Latex Rash 11/03/2013    Family History  Problem Relation Age of Onset   Depression Mother    Drug abuse Mother    Hypertension Father    Breast cancer Sister        dx 77   Hypertension Maternal Grandmother    Hypertension Maternal Grandfather    Lung cancer Maternal Grandfather    Hypertension Paternal Grandmother    Breast cancer Paternal Grandmother        dx late 32s   Colon cancer Maternal Aunt        62   Colon cancer Cousin 31       maternal    Social History   Socioeconomic History   Marital status: Single    Spouse name: Not on file   Number of children: Not on file   Years of education: Not on file   Highest education level: Not on file  Occupational History   Not on file  Tobacco Use   Smoking status: Former    Packs/day: 0.00    Years: 1.00    Pack years: 0.00    Types: Cigars, Cigarettes    Quit date: 01/05/2017    Years since quitting: 4.3   Smokeless tobacco: Never  Vaping Use   Vaping Use: Never used  Substance and Sexual Activity   Alcohol use: Yes    Comment: occasional   Drug use: No   Sexual activity: Yes    Birth control/protection: Condom  Other Topics Concern   Not on file  Social History Narrative   Lives home  with mother.  Works at [employer].  Education 10th grade/GED.  No children.  Single.     Social Determinants of Health   Financial Resource Strain: Not on file  Food Insecurity: Not on file  Transportation Needs: Not on file  Physical Activity: Not on file  Stress: Not on file  Social Connections: Not on file  Intimate Partner Violence: Not on file    Review of Systems: See HPI, otherwise negative ROS  Physical Exam: Vital signs in last 24 hours: Temp:  [98.6 F (37 C)] 98.6 F (37 C) (01/10 0952) Pulse Rate:  [93] 93 (01/10 0952) Resp:  [17] 17 (  01/10 0952) BP: (127)/(88) 127/88 (01/10 0952) SpO2:  [98 %] 98 % (01/10 0952)   General:   Alert,  Well-developed, well-nourished, pleasant and cooperative in NAD Head:  Normocephalic and atraumatic. Eyes:  Sclera clear, no icterus.   Conjunctiva pink. Ears:  Normal auditory acuity. Nose:  No deformity, discharge,  or lesions. Mouth:  No deformity or lesions, dentition normal. Neck:  Supple; no masses or thyromegaly. Lungs:  Clear throughout to auscultation.   No wheezes, crackles, or rhonchi. No acute distress. Heart:  Regular rate and rhythm; no murmurs, clicks, rubs,  or gallops. Abdomen:  Soft, nontender and nondistended. No masses, hepatosplenomegaly or hernias noted. Normal bowel sounds, without guarding, and without rebound.   Msk:  Symmetrical without gross deformities. Normal posture. Extremities:  Without clubbing or edema. Neurologic:  Alert and  oriented x4;  grossly normal neurologically. Skin:  Intact without significant lesions or rashes. Cervical Nodes:  No significant cervical adenopathy. Psych:  Alert and cooperative. Normal mood and affect.  Impression/Plan: Rebecca Boyd is here for a colonoscopy to be performed for colon cancer screening purposes. Maternal cousin with colon cancer at age 25, maternal aunt with stage IV colon cancer diagnosed at age 61.  The risks of the procedure including  infection, bleed, or perforation as well as benefits, limitations, alternatives and imponderables have been reviewed with the patient. Questions have been answered. All parties agreeable.

## 2021-05-10 NOTE — Op Note (Signed)
[facility] Patient Name: Rebecca Boyd Procedure Date: 05/10/2021 10:03 AM MRN: [id number] Date of Birth: 10-01-1975 Attending MD: Elon Alas. Abbey Chatters DO CSN: [id number] Age: 46 Admit Type: Outpatient Procedure:                Colonoscopy Indications:              Screening for colorectal malignant neoplasm Providers:                Elon Alas. Abbey Chatters, DO, Caprice Kluver, Raphael Gibney,                            Technician Referring MD:              Medicines:                See the Anesthesia note for documentation of the                            administered medications Complications:            No immediate complications. Estimated Blood Loss:     Estimated blood loss was minimal. Procedure:                Pre-Anesthesia Assessment:                           - The anesthesia plan was to use monitored                            anesthesia care (MAC).                           After obtaining informed consent, the colonoscope                            was passed under direct vision. Throughout the                            procedure, the patient's blood pressure, pulse, and                            oxygen saturations were monitored continuously. The                            [id number]-HQ190L ([id number]) scope was introduced through                            the anus and advanced to the the cecum, identified                            by appendiceal orifice and ileocecal valve. The                            colonoscopy was performed without difficulty. The                            patient tolerated the procedure well. The quality  of the bowel preparation was evaluated using the                            BBPS [facility] Bowel Preparation Scale) with scores                            of: Right Colon = 3, Transverse Colon = 3 and Left                            Colon = 3 (entire mucosa seen well with no residual                            staining, small  fragments of stool or opaque                            liquid). The total BBPS score equals 9. Scope In: 10:14:24 AM Scope Out: 10:24:56 AM Scope Withdrawal Time: 0 hours 8 minutes 36 seconds  Total Procedure Duration: 0 hours 10 minutes 32 seconds  Findings:      The perianal and digital rectal examinations were normal.      Non-bleeding internal hemorrhoids were found during endoscopy.      Four sessile polyps were found in the recto-sigmoid colon. The polyps       were 3 to 4 mm in size. These polyps were removed with a cold snare.       Resection and retrieval were complete. Impression:               - Non-bleeding internal hemorrhoids.                           - Four 3 to 4 mm polyps at the recto-sigmoid colon,                            removed with a cold snare. Resected and retrieved. Moderate Sedation:      Per Anesthesia Care Recommendation:           - Patient has a contact number available for                            emergencies. The signs and symptoms of potential                            delayed complications were discussed with the                            patient. Return to normal activities tomorrow.                            Written discharge instructions were provided to the                            patient.                           - Resume previous diet.                           -  Continue present medications.                           - Await pathology results.                           - Repeat colonoscopy in 5-10 years for surveillance.                           - Return to GI clinic PRN. Procedure Code(s):        --- Professional ---                           [phone], Colonoscopy, flexible; with removal of                            tumor(s), polyp(s), or other lesion(s) by snare                            technique Diagnosis Code(s):        --- Professional ---                           Z12.11, Encounter for screening for malignant                             neoplasm of colon                           K63.5, Polyp of colon                           K64.8, Other hemorrhoids CPT copyright 2019 American Medical Association. All rights reserved. The codes documented in this report are preliminary and upon coder review may  be revised to meet current compliance requirements. Elon Alas. Abbey Chatters, DO Rebecca Daejon Lich, DO 05/10/2021 10:30:15 AM This report has been signed electronically. Number of Addenda: 0

## 2021-05-11 LAB — SURGICAL PATHOLOGY

## 2021-05-11 NOTE — Anesthesia Postprocedure Evaluation (Signed)
Anesthesia Post Note  Patient: Rebecca Boyd  Procedure(s) Performed: COLONOSCOPY WITH PROPOFOL POLYPECTOMY  Anesthesia Type: General Anesthetic complications: no   No notable events documented.   Last Vitals:  Vitals:   05/10/21 0952 05/10/21 1027  BP: 127/88 112/67  Pulse: 93   Resp: 17 15  Temp: 37 C 36.5 C  SpO2: 98% 100%    Last Pain:  Vitals:   05/10/21 1027  TempSrc: Oral  PainSc: 0-No pain                 Louann Sjogren

## 2021-05-13 ENCOUNTER — Encounter (HOSPITAL_COMMUNITY): Payer: Self-pay | Admitting: Internal Medicine

## 2021-05-19 ENCOUNTER — Encounter: Payer: Self-pay | Admitting: Family Medicine

## 2021-05-19 ENCOUNTER — Other Ambulatory Visit: Payer: Self-pay

## 2021-05-19 ENCOUNTER — Ambulatory Visit (INDEPENDENT_AMBULATORY_CARE_PROVIDER_SITE_OTHER): Payer: BC Managed Care – PPO | Admitting: Family Medicine

## 2021-05-19 VITALS — BP 127/91 | HR 101 | Resp 15 | Ht 60.0 in | Wt 186.1 lb

## 2021-05-19 DIAGNOSIS — I10 Essential (primary) hypertension: Secondary | ICD-10-CM

## 2021-05-19 DIAGNOSIS — R21 Rash and other nonspecific skin eruption: Secondary | ICD-10-CM

## 2021-05-19 DIAGNOSIS — E669 Obesity, unspecified: Secondary | ICD-10-CM | POA: Diagnosis not present

## 2021-05-19 MED ORDER — WEGOVY 0.25 MG/0.5ML ~~LOC~~ SOAJ: 0.2500 mg | SUBCUTANEOUS | 1 refills | Status: DC

## 2021-05-19 NOTE — Patient Instructions (Signed)
F/U in 7 weeks, re eval weight and blood pressure  New for weight loss is once weekly injection weygovy  Limit calories to 1500 per day  It is important that you exercise regularly at least 30 minutes 5 times a week. If you develop chest pain, have severe difficulty breathing, or feel very tired, stop exercising immediately and seek medical attention   Think about what you will eat, plan ahead. Choose " clean, green, fresh or frozen" over canned, processed or packaged foods which are more sugary, salty and fatty. 70 to 75% of food eaten should be vegetables and fruit. Three meals at set times with snacks allowed between meals, but they must be fruit or vegetables. Aim to eat over a 12 hour period , example 7 am to 7 pm, and STOP after  your last meal of the day. Drink water,generally about 64 ounces per day, no other drink is as healthy. Fruit juice is best enjoyed in a healthy way, by EATING the fruit. Thanks for choosing [facility], we consider it a privelige to serve you.

## 2021-05-19 NOTE — Progress Notes (Signed)
° °  Rebecca Boyd     MRN: [id number]      DOB: 1975-09-01   HPI Rebecca Boyd is here for follow up and re-evaluation of chronic medical conditions, medication management and review of any available recent lab and radiology data.  Preventive health is updated, specifically  Cancer screening and Immunization.   Questions or concerns regarding consultations or procedures which the PT has had in the interim are  addressed. The PT denies any adverse reactions to current medications since the last visit.  C/o inability to lose weight and ongoing weight gain  ROS Denies recent fever or chills. Denies sinus pressure, nasal congestion, ear pain or sore throat. Denies chest congestion, productive cough or wheezing. Denies chest pains, palpitations and leg swelling Denies abdominal pain, nausea, vomiting,diarrhea or constipation.   Denies dysuria, frequency, hesitancy or incontinence. Denies joint pain, swelling and limitation in mobility. Denies headaches, seizures, numbness, or tingling. Denies depression, anxiety or insomnia. Denies skin break down or rash.   PE  BP (!) 127/91    Pulse (!) 101    Resp 15    Ht 5' (1.524 m)    Wt 186 lb 1.3 oz (84.4 kg)    BMI 36.34 kg/m   Patient alert and oriented and in no cardiopulmonary distress.  HEENT: No facial asymmetry, EOMI,     Neck supple .  Chest: Clear to auscultation bilaterally.  CVS: S1, S2 no murmurs, no S3.Regular rate.  ABD: Soft non tender.   Ext: No edema  MS: Adequate ROM spine, shoulders, hips and knees.  Skin: Intact, no ulcerations or rash noted.  Psych: Good eye contact, normal affect. Memory intact not anxious or depressed appearing.  CNS: CN 2-12 intact, power,  normal throughout.no focal deficits noted.   Assessment & Plan  Obesity (BMI 30.0-34.9)  Patient re-educated about  the importance of commitment to a  minimum of 150 minutes of exercise per week as able.  The importance of healthy food choices with  portion control discussed, as well as eating regularly and within a 12 hour window most days. The need to choose "clean , green" food 50 to 75% of the time is discussed, as well as to make water the primary drink and set a goal of 64 ounces water daily.    Weight /BMI 05/19/2021 04/23/2021 03/30/2021  WEIGHT 186 lb 1.3 oz 187 lb 2.7 oz 185 lb 0.6 oz  HEIGHT 5\' 0"  5\' 0"  5\' 0"   BMI 36.34 kg/m2 36.55 kg/m2 36.14 kg/m2  Some encounter information is confidential and restricted. Go to Review Flowsheets activity to see all data.  start medication and reduce caloric intake    Rash and nonspecific skin eruption Improved, treated by Derm  Essential hypertension Not at [facility] on weight loss and salt reduction DASH diet and commitment to daily physical activity for a minimum of 30 minutes discussed and encouraged, as a part of hypertension management. The importance of attaining a healthy weight is also discussed.  BP/Weight 05/19/2021 05/10/2021 04/23/2021 03/30/2021 03/11/2021 33/54/5625 10/02/8935  Systolic BP 342 876 811 572 620 355 974  Diastolic BP 91 67 79 93 98 92 99  Wt. (Lbs) 186.08 - 187.17 185.04 185 189.8 184  BMI 36.34 - 36.55 36.14 36.13 37.07 35.94  Some encounter information is confidential and restricted. Go to Review Flowsheets activity to see all data.       Migraine syndrome Controlled, no change in medication Managed by Neurology

## 2021-05-21 ENCOUNTER — Encounter: Payer: Self-pay | Admitting: Family Medicine

## 2021-05-21 NOTE — Assessment & Plan Note (Signed)
°  Patient re-educated about  the importance of commitment to a  minimum of 150 minutes of exercise per week as able.  The importance of healthy food choices with portion control discussed, as well as eating regularly and within a 12 hour window most days. The need to choose "clean , green" food 50 to 75% of the time is discussed, as well as to make water the primary drink and set a goal of 64 ounces water daily.    Weight /BMI 05/19/2021 04/23/2021 03/30/2021  WEIGHT 186 lb 1.3 oz 187 lb 2.7 oz 185 lb 0.6 oz  HEIGHT 5\' 0"  5\' 0"  5\' 0"   BMI 36.34 kg/m2 36.55 kg/m2 36.14 kg/m2  Some encounter information is confidential and restricted. Go to Review Flowsheets activity to see all data.  start medication and reduce caloric intake

## 2021-05-21 NOTE — Assessment & Plan Note (Signed)
Controlled, no change in medication Managed by Neurology 

## 2021-05-21 NOTE — Assessment & Plan Note (Signed)
Improved, treated by Payton Mccallum

## 2021-05-21 NOTE — Assessment & Plan Note (Signed)
Not at [facility] on weight loss and salt reduction DASH diet and commitment to daily physical activity for a minimum of 30 minutes discussed and encouraged, as a part of hypertension management. The importance of attaining a healthy weight is also discussed.  BP/Weight 05/19/2021 05/10/2021 04/23/2021 03/30/2021 03/11/2021 16/01/9603 09/02/979  Systolic BP 191 478 295 621 308 657 846  Diastolic BP 91 67 79 93 98 92 99  Wt. (Lbs) 186.08 - 187.17 185.04 185 189.8 184  BMI 36.34 - 36.55 36.14 36.13 37.07 35.94  Some encounter information is confidential and restricted. Go to Review Flowsheets activity to see all data.

## 2021-06-15 ENCOUNTER — Ambulatory Visit (INDEPENDENT_AMBULATORY_CARE_PROVIDER_SITE_OTHER): Payer: BC Managed Care – PPO | Admitting: Family Medicine

## 2021-06-15 ENCOUNTER — Other Ambulatory Visit: Payer: Self-pay

## 2021-06-15 ENCOUNTER — Encounter: Payer: Self-pay | Admitting: Family Medicine

## 2021-06-15 VITALS — BP 149/96 | Wt 189.0 lb

## 2021-06-15 DIAGNOSIS — Z79891 Long term (current) use of opiate analgesic: Secondary | ICD-10-CM | POA: Diagnosis not present

## 2021-06-15 DIAGNOSIS — I10 Essential (primary) hypertension: Secondary | ICD-10-CM

## 2021-06-15 DIAGNOSIS — J309 Allergic rhinitis, unspecified: Secondary | ICD-10-CM | POA: Diagnosis not present

## 2021-06-15 DIAGNOSIS — M542 Cervicalgia: Secondary | ICD-10-CM | POA: Diagnosis not present

## 2021-06-15 DIAGNOSIS — M545 Low back pain, unspecified: Secondary | ICD-10-CM | POA: Diagnosis not present

## 2021-06-15 DIAGNOSIS — E669 Obesity, unspecified: Secondary | ICD-10-CM

## 2021-06-15 MED ORDER — MONTELUKAST SODIUM 10 MG PO TABS: 10.0000 mg | ORAL_TABLET | Freq: Every day | ORAL | 3 refills | Status: DC

## 2021-06-15 MED ORDER — SPIRONOLACTONE 25 MG PO TABS: 25.0000 mg | ORAL_TABLET | Freq: Every day | ORAL | 3 refills | Status: DC

## 2021-06-15 NOTE — Telephone Encounter (Signed)
Appointment scheduled for 06/17/21

## 2021-06-15 NOTE — Patient Instructions (Signed)
F/U as before, call if you need me sooner  Singulair one daily prescribed for allergies  New additional bP medication, spironolactone is prescribed  It is important that you exercise regularly at least 30 minutes 5 times a week. If you develop chest pain, have severe difficulty breathing, or feel very tired, stop exercising immediately and seek medical attention   Thanks for choosing [facility], we consider it a privelige to serve you.

## 2021-06-15 NOTE — Progress Notes (Signed)
Virtual Visit via Telephone Note  I connected with Rebecca Boyd on 06/15/21 at  4:40 PM EST by telephone and verified that I am speaking with the correct person using two identifiers.  Location: Patient: home Provider: office   I discussed the limitations, risks, security and privacy concerns of performing an evaluation and management service by telephone and the availability of in person appointments. I also discussed with the patient that there may be a patient responsible charge related to this service. The patient expressed understanding and agreed to proceed.   History of Present Illness:   1 week h/o runny nose and runny eyes, no cough, fever, chills , ear pain or sore throat, no known sick contact C/o elevated BP readings Observations/Objective: BP (!) 149/96    Wt 189 lb (85.7 kg)    BMI 36.91 kg/m  Good communication with no confusion and intact memory. Alert and oriented x 3 Nasal congestion and cough   Assessment and Plan: Essential hypertension Unbcontrolled, spironolactone added DASH diet and commitment to daily physical activity for a minimum of 30 minutes discussed and encouraged, as a part of hypertension management. The importance of attaining a healthy weight is also discussed.  BP/Weight 06/15/2021 05/19/2021 05/10/2021 04/23/2021 03/30/2021 03/11/2021 13/11/6576  Systolic BP 469 629 528 413 244 010 272  Diastolic BP 96 91 67 79 93 98 92  Wt. (Lbs) 189 186.08 - 187.17 185.04 185 189.8  BMI 36.91 36.34 - 36.55 36.14 36.13 37.07  Some encounter information is confidential and restricted. Go to Review Flowsheets activity to see all data.       Allergic rhinitis Current symptom flare , commit to daily singulair which is being prescribed  Obesity (BMI 30.0-34.9)  Patient re-educated about  the importance of commitment to a  minimum of 150 minutes of exercise per week as able.  The importance of healthy food choices with portion control discussed, as well  as eating regularly and within a 12 hour window most days. The need to choose "clean , green" food 50 to 75% of the time is discussed, as well as to make water the primary drink and set a goal of 64 ounces water daily.    Weight /BMI 06/15/2021 05/19/2021 04/23/2021  WEIGHT 189 lb 186 lb 1.3 oz 187 lb 2.7 oz  HEIGHT - 5\' 0"  5\' 0"   BMI 36.91 kg/m2 36.34 kg/m2 36.55 kg/m2  Some encounter information is confidential and restricted. Go to Review Flowsheets activity to see all data.  deteriorated     Follow Up Instructions:    I discussed the assessment and treatment plan with the patient. The patient was provided an opportunity to ask questions and all were answered. The patient agreed with the plan and demonstrated an understanding of the instructions.   The patient was advised to call back or seek an in-person evaluation if the symptoms worsen or if the condition fails to improve as anticipated.  I provided 14 minutes of non-face-to-face time during this encounter.   Tula Nakayama, MD

## 2021-06-15 NOTE — Telephone Encounter (Signed)
Appt made 06-15-21

## 2021-06-17 ENCOUNTER — Ambulatory Visit: Payer: BC Managed Care – PPO | Admitting: Family Medicine

## 2021-06-20 ENCOUNTER — Encounter: Payer: Self-pay | Admitting: Family Medicine

## 2021-06-20 NOTE — Assessment & Plan Note (Signed)
Current symptom flare , commit to daily singulair which is being prescribed

## 2021-06-20 NOTE — Assessment & Plan Note (Signed)
Unbcontrolled, spironolactone added DASH diet and commitment to daily physical activity for a minimum of 30 minutes discussed and encouraged, as a part of hypertension management. The importance of attaining a healthy weight is also discussed.  BP/Weight 06/15/2021 05/19/2021 05/10/2021 04/23/2021 03/30/2021 03/11/2021 91/79/1505  Systolic BP 697 948 016 553 748 270 786  Diastolic BP 96 91 67 79 93 98 92  Wt. (Lbs) 189 186.08 - 187.17 185.04 185 189.8  BMI 36.91 36.34 - 36.55 36.14 36.13 37.07  Some encounter information is confidential and restricted. Go to Review Flowsheets activity to see all data.

## 2021-06-20 NOTE — Assessment & Plan Note (Signed)
°  Patient re-educated about  the importance of commitment to a  minimum of 150 minutes of exercise per week as able.  The importance of healthy food choices with portion control discussed, as well as eating regularly and within a 12 hour window most days. The need to choose "clean , green" food 50 to 75% of the time is discussed, as well as to make water the primary drink and set a goal of 64 ounces water daily.    Weight /BMI 06/15/2021 05/19/2021 04/23/2021  WEIGHT 189 lb 186 lb 1.3 oz 187 lb 2.7 oz  HEIGHT - 5\' 0"  5\' 0"   BMI 36.91 kg/m2 36.34 kg/m2 36.55 kg/m2  Some encounter information is confidential and restricted. Go to Review Flowsheets activity to see all data.  deteriorated

## 2021-07-07 ENCOUNTER — Ambulatory Visit (INDEPENDENT_AMBULATORY_CARE_PROVIDER_SITE_OTHER): Payer: BC Managed Care – PPO | Admitting: Family Medicine

## 2021-07-07 ENCOUNTER — Other Ambulatory Visit: Payer: Self-pay

## 2021-07-07 ENCOUNTER — Encounter: Payer: Self-pay | Admitting: Family Medicine

## 2021-07-07 VITALS — BP 143/93 | HR 62 | Resp 16 | Ht 60.0 in | Wt 188.0 lb

## 2021-07-07 DIAGNOSIS — I10 Essential (primary) hypertension: Secondary | ICD-10-CM

## 2021-07-07 DIAGNOSIS — N760 Acute vaginitis: Secondary | ICD-10-CM

## 2021-07-07 DIAGNOSIS — E669 Obesity, unspecified: Secondary | ICD-10-CM

## 2021-07-07 DIAGNOSIS — R7989 Other specified abnormal findings of blood chemistry: Secondary | ICD-10-CM | POA: Insufficient documentation

## 2021-07-07 DIAGNOSIS — R21 Rash and other nonspecific skin eruption: Secondary | ICD-10-CM

## 2021-07-07 DIAGNOSIS — B9689 Other specified bacterial agents as the cause of diseases classified elsewhere: Secondary | ICD-10-CM

## 2021-07-07 MED ORDER — METRONIDAZOLE 0.75 % VA GEL: 1.0000 | Freq: Two times a day (BID) | VAGINAL | 2 refills | Status: DC

## 2021-07-07 MED ORDER — BETAMETHASONE DIPROPIONATE 0.05 % EX CREA: TOPICAL_CREAM | Freq: Two times a day (BID) | CUTANEOUS | 2 refills | Status: DC

## 2021-07-07 MED ORDER — ERGOCALCIFEROL 1.25 MG (50000 UT) PO CAPS: 50000.0000 [IU] | ORAL_CAPSULE | ORAL | 1 refills | Status: DC

## 2021-07-07 MED ORDER — SPIRONOLACTONE 50 MG PO TABS: 50.0000 mg | ORAL_TABLET | Freq: Every day | ORAL | 2 refills | Status: DC

## 2021-07-07 NOTE — Progress Notes (Signed)
? ?Rebecca Boyd     MRN: [id number]      DOB: July 11, 1975 ? ? ?HPI ?Rebecca Boyd is here for follow up and re-evaluation of chronic medical conditions, medication management and review of any available recent lab and radiology data.  ?Preventive health is updated, specifically  Cancer screening and Immunization.   ?Questions or concerns regarding consultations or procedures which the PT has had in the interim are  addressed. ?The PT denies any adverse reactions to current medications since the last visit.  ?C/o fishy d/c which frequently occurs after cycle , wants medication ?C/o itchy generalized rash , states she was told it was insect bites, requests steroid medication which has helped in  the past   ? ?ROS ?Denies recent fever or chills. ?Denies sinus pressure, nasal congestion, ear pain or sore throat. ?Denies chest congestion, productive cough or wheezing. ?Denies chest pains, palpitations and leg swelling ?Denies abdominal pain, nausea, vomiting,diarrhea or constipation.   ?Denies dysuria, frequency, hesitancy or incontinence. ?Denies joint pain, swelling and limitation in mobility. ?Denies headaches, seizures, nDenies skin break down or rash. ?States never got the q wegovy and needs help with weight loss ? ?PE ? ?BP (!) 143/93   Pulse 62   Resp 16   Ht 5' (1.524 m)   Wt 188 lb (85.3 kg)   SpO2 96%   BMI 36.72 kg/m?  ? ?Patient alert and oriented and in no cardiopulmonary distress. ? ?HEENT: No facial asymmetry, EOMI,     Neck supple . ? ?Chest: Clear to auscultation bilaterally. ? ?CVS: S1, S2 no murmurs, no S3.Regular rate. ? ?ABD: Soft non tender.  ? ?Ext: No edema ? ?MS: Adequate ROM spine, shoulders, hips and knees. ? ?Skin: Intact, hyperpigmented  macular annular  rash on chest and upper extremities ?Psych: Good eye contact, normal affect. Memory intact not anxious or depressed appearing. ? ?CNS: CN 2-12 intact, power,  normal throughout.no focal deficits noted. ? ? ?Assessment & Plan ? ?Essential  hypertension ?Uncontrolled, increase spironolactone to 50 mg daily ?DASH diet and commitment to daily physical activity for a minimum of 30 minutes discussed and encouraged, as a part of hypertension management. ?The importance of attaining a healthy weight is also discussed. ? ?BP/Weight 07/07/2021 06/15/2021 05/19/2021 05/10/2021 04/23/2021 03/30/2021 03/11/2021  ?Systolic BP 341 937 902 409 122 139 151  ?Diastolic BP 93 96 91 67 79 93 98  ?Wt. (Lbs) 188 189 186.08 - 187.17 185.04 185  ?BMI 36.72 36.91 36.34 - 36.55 36.14 36.13  ?Some encounter information is confidential and restricted. Go to Review Flowsheets activity to see all data.  ? ? ? ? ? ?Rash and nonspecific skin eruption ?Topical high potency steroid prescribed ? ?Obesity (BMI 30.0-34.9) ?F/u on wegovy ?Patient re-educated about  the importance of commitment to a  minimum of 150 minutes of exercise per week as able. ? ?The importance of healthy food choices with portion control discussed, as well as eating regularly and within a 12 hour window most days. ?The need to choose "clean , green" food 50 to 75% of the time is discussed, as well as to make water the primary drink and set a goal of 64 ounces water daily. ? ?  ?Weight /BMI 07/07/2021 06/15/2021 05/19/2021  ?WEIGHT 188 lb 189 lb 186 lb 1.3 oz  ?HEIGHT '5\' 0"'$  - '5\' 0"'$   ?BMI 36.72 kg/m2 36.91 kg/m2 36.34 kg/m2  ?Some encounter information is confidential and restricted. Go to Review Flowsheets activity to see all data.  ? ? ? ? ?  Low vitamin D level ?Once weekly vitamin D is prescribed ? 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 ?

## 2021-07-07 NOTE — Assessment & Plan Note (Signed)
Recurrent episodes, current flare reported, metrogel is prescribed ?

## 2021-07-07 NOTE — Patient Instructions (Addendum)
F/U in 7 weeks, call if you need me sooner, please bring meds to visit ? ? ?New higher dose of spironolactone 50 mg daily ? ?Change diet as discussed. ? ?Nurse please give f/u on  wegovy for this patient ? ?Metrogel and steroid cream are prescribed ? ?It is important that you exercise regularly at least 30 minutes 5 times a week. If you develop chest pain, have severe difficulty breathing, or feel very tired, stop exercising immediately and seek medical attention  ? ?Thanks for choosing [facility], we consider it a privelige to serve you. ? ?

## 2021-07-07 NOTE — Assessment & Plan Note (Addendum)
F/u on wegovy ?Patient re-educated about  the importance of commitment to a  minimum of 150 minutes of exercise per week as able. ? ?The importance of healthy food choices with portion control discussed, as well as eating regularly and within a 12 hour window most days. ?The need to choose "clean , green" food 50 to 75% of the time is discussed, as well as to make water the primary drink and set a goal of 64 ounces water daily. ? ?  ?Weight /BMI 07/07/2021 06/15/2021 05/19/2021  ?WEIGHT 188 lb 189 lb 186 lb 1.3 oz  ?HEIGHT '5\' 0"'$  - '5\' 0"'$   ?BMI 36.72 kg/m2 36.91 kg/m2 36.34 kg/m2  ?Some encounter information is confidential and restricted. Go to Review Flowsheets activity to see all data.  ? ? ? ?

## 2021-07-07 NOTE — Assessment & Plan Note (Signed)
Topical high potency steroid prescribed ?

## 2021-07-07 NOTE — Assessment & Plan Note (Signed)
Uncontrolled, increase spironolactone to 50 mg daily ?DASH diet and commitment to daily physical activity for a minimum of 30 minutes discussed and encouraged, as a part of hypertension management. ?The importance of attaining a healthy weight is also discussed. ? ?BP/Weight 07/07/2021 06/15/2021 05/19/2021 05/10/2021 04/23/2021 03/30/2021 03/11/2021  ?Systolic BP 166 063 016 010 122 139 151  ?Diastolic BP 93 96 91 67 79 93 98  ?Wt. (Lbs) 188 189 186.08 - 187.17 185.04 185  ?BMI 36.72 36.91 36.34 - 36.55 36.14 36.13  ?Some encounter information is confidential and restricted. Go to Review Flowsheets activity to see all data.  ? ? ? ? ?

## 2021-07-07 NOTE — Assessment & Plan Note (Signed)
Once weekly vitamin D is prescribed ?

## 2021-07-18 ENCOUNTER — Encounter: Payer: Self-pay | Admitting: Family Medicine

## 2021-07-20 ENCOUNTER — Other Ambulatory Visit: Payer: Self-pay

## 2021-07-20 DIAGNOSIS — M79602 Pain in left arm: Secondary | ICD-10-CM

## 2021-07-25 ENCOUNTER — Encounter: Payer: Self-pay | Admitting: Orthopedic Surgery

## 2021-07-25 ENCOUNTER — Ambulatory Visit (INDEPENDENT_AMBULATORY_CARE_PROVIDER_SITE_OTHER): Payer: BC Managed Care – PPO | Admitting: Orthopedic Surgery

## 2021-07-25 ENCOUNTER — Other Ambulatory Visit: Payer: Self-pay

## 2021-07-25 VITALS — BP 147/96 | HR 87 | Ht 60.0 in | Wt 187.6 lb

## 2021-07-25 DIAGNOSIS — M7712 Lateral epicondylitis, left elbow: Secondary | ICD-10-CM | POA: Diagnosis not present

## 2021-07-25 MED ORDER — IBUPROFEN 800 MG PO TABS: 800.0000 mg | ORAL_TABLET | Freq: Three times a day (TID) | ORAL | 1 refills | Status: DC | PRN

## 2021-07-25 NOTE — Progress Notes (Signed)
Chief Complaint  ?Patient presents with  ? Arm Pain  ?  LEFT arm/ hurts at elbow ?So painful it is hard to lift at times  ? ? ?HPI: 46 year old female presents with left lateral elbow pain x1 month no injury she took ibuprofen and Percocet did not get good relief she complains of increased pain with all use of the left upper extremity and has trouble and increased pain when she lifts her arm but only complains of mild discomfort in the left shoulder most of her pain 90% of it is around the left elbow ? ?Past Medical History:  ?Diagnosis Date  ? Anemia   ? Anxiety   ? Bacterial vaginosis 07/27/2014  ? Cervical neck pain with evidence of disc disease 10/02/2013  ? Depressive disorder, not elsewhere classified   ? Epistaxis 07/09/2019  ? Family history of breast cancer   ? Family history of breast cancer in sister 09/25/2018  ? Family history of colon cancer   ? Family history of lung cancer   ? FH: breast cancer in relative when <62 years old 06/10/2018  ? Dx at age 34  ? Headache 03/16/2016  ? 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 Initial dx is 05/2013   ? Hypertension   ? Migraine   ? Migraines   ? Miscarriage 2009  ? Neck pain   ? Nondisplaced fracture of fifth metatarsal bone, left foot, subsequent encounter for fracture with routine healing 08/15/18 09/25/2018  ? Pruritus 09/29/2018  ? Pruritus ani 09/25/2018  ? Rash and nonspecific skin eruption 05/14/2017  ? Vaginitis   ? cyctitis  ? ? ?BP (!) 147/96   Pulse 87   Ht 5' (1.524 m)   Wt 187 lb 9.6 oz (85.1 kg)   BMI 36.64 kg/m?  ? ? ?General appearance: Well-developed well-nourished no gross deformities ? ?Cardiovascular normal pulse and perfusion normal color without edema ? ?Neurologically no sensation loss or deficits or pathologic reflexes ? ?Psychological: Awake alert and oriented x3 mood and affect normal ? ?Skin no lacerations or ulcerations no nodularity no palpable masses, no erythema or nodularity ? ?Musculoskeletal: Left elbow exam tenderness over the lateral  epicondyles lateral humerus and proximal lateral forearm with pain exacerbated by wrist extension against resistance ? ?Imaging no imaging ? ?A/P ? ?Encounter Diagnosis  ?Name Primary?  ? Lateral epicondylitis, left elbow Yes  ? ?Meds ordered this encounter  ?Medications  ? ibuprofen (ADVIL) 800 MG tablet  ?  Sig: Take 1 tablet (800 mg total) by mouth every 8 (eight) hours as needed.  ?  Dispense:  90 tablet  ?  Refill:  1  ? ? ?Mostly I gave her good patient education and imaging and pictures about her situation and told her that she can only control this because she will not be able to rest long enough to get it under control ? ?She will wear a brace take the medication I gave her an injection ? ?Procedure note ? ?Injection ? ?Verbal consent was obtained to inject the lateral epicondylar area of the left elbow ? ?Timeout procedure was completed to confirm injection site ? ?Diagnosis lateral epicondylitis left elbow ? ?Medications used ?Depo-Medrol 40 mg ?Lidocaine 1% plain 3 cc ? ?Anesthesia was provided by ethyl chloride spray ? ?Prep was performed with alcohol ? ?Technique of injection the elbow was placed at 90 degrees of flexion the area of palpable maximal tenderness just off the lateral epicondyle was found and the injection was given at that site ? ?  No complications were noted ? ?

## 2021-08-09 ENCOUNTER — Telehealth: Payer: Self-pay | Admitting: Family Medicine

## 2021-08-09 ENCOUNTER — Other Ambulatory Visit: Payer: Self-pay

## 2021-08-09 MED ORDER — AMLODIPINE BESYLATE 10 MG PO TABS: 10.0000 mg | ORAL_TABLET | Freq: Every day | ORAL | 1 refills | Status: DC

## 2021-08-09 NOTE — Telephone Encounter (Signed)
Patient needs refill on   amLODipine (NORVASC) 10 MG tablet 

## 2021-08-25 ENCOUNTER — Ambulatory Visit (INDEPENDENT_AMBULATORY_CARE_PROVIDER_SITE_OTHER): Payer: BC Managed Care – PPO | Admitting: Family Medicine

## 2021-08-25 ENCOUNTER — Encounter: Payer: Self-pay | Admitting: Family Medicine

## 2021-08-25 VITALS — BP 132/89 | HR 86 | Resp 16 | Ht 60.0 in | Wt 187.0 lb

## 2021-08-25 DIAGNOSIS — J309 Allergic rhinitis, unspecified: Secondary | ICD-10-CM

## 2021-08-25 DIAGNOSIS — E559 Vitamin D deficiency, unspecified: Secondary | ICD-10-CM | POA: Diagnosis not present

## 2021-08-25 DIAGNOSIS — G43909 Migraine, unspecified, not intractable, without status migrainosus: Secondary | ICD-10-CM

## 2021-08-25 DIAGNOSIS — I10 Essential (primary) hypertension: Secondary | ICD-10-CM | POA: Diagnosis not present

## 2021-08-25 DIAGNOSIS — E785 Hyperlipidemia, unspecified: Secondary | ICD-10-CM

## 2021-08-25 DIAGNOSIS — R7303 Prediabetes: Secondary | ICD-10-CM

## 2021-08-25 DIAGNOSIS — R7989 Other specified abnormal findings of blood chemistry: Secondary | ICD-10-CM | POA: Diagnosis not present

## 2021-08-25 NOTE — Progress Notes (Signed)
? ?Rebecca Boyd     MRN: [id number]      DOB: 1975-12-27 ? ? ?HPI ?Rebecca Boyd is here for follow up and re-evaluation of chronic medical conditions, medication management and review of any available recent lab and radiology data.  ?Preventive health is updated, specifically  Cancer screening and Immunization.   ?Questions or concerns regarding consultations or procedures which the PT has had in the interim are  addressed. ?The PT denies any adverse reactions to current medications since the last visit.  ?There are no new concerns.  ?There are no specific complaints  ? ?ROS ?Denies recent fever or chills. ?Denies sinus pressure, nasal congestion, ear pain or sore throat. ?Denies chest congestion, productive cough or wheezing. ?Denies chest pains, palpitations and leg swelling ?Denies abdominal pain, nausea, vomiting,diarrhea or constipation.   ?Denies dysuria, frequency, hesitancy or incontinence. ?Denies joint pain, swelling and limitation in mobility. ?Denies headaches, seizures, numbness, or tingling. ?Denies depression, anxiety or insomnia. ?Denies skin break down or rash. ? ? ?PE ? ?BP 132/89   Pulse 86   Resp 16   Ht 5' (1.524 m)   Wt 187 lb (84.8 kg)   SpO2 98%   BMI 36.52 kg/m?  ? ?Patient alert and oriented and in no cardiopulmonary distress. ? ?HEENT: No facial asymmetry, EOMI,     Neck supple . ? ?Chest: Clear to auscultation bilaterally. ? ?CVS: S1, S2 no murmurs, no S3.Regular rate. ? ?ABD: Soft non tender.  ? ?Ext: No edema ? ?MS: Adequate ROM spine, shoulders, hips and knees. ? ?Skin: Intact, no ulcerations or rash noted. ? ?Psych: Good eye contact, normal affect. Memory intact not anxious or depressed appearing. ? ?CNS: CN 2-12 intact, power,  normal throughout.no focal deficits noted. ? ? ?Assessment & Plan ? ?Essential hypertension ?Controlled, no change in medication ?DASH diet and commitment to daily physical activity for a minimum of 30 minutes discussed and encouraged, as a part of  hypertension management. ?The importance of attaining a healthy weight is also discussed. ? ? ?  08/25/2021  ?  8:34 AM 07/25/2021  ?  1:19 PM 07/07/2021  ?  8:27 AM 06/15/2021  ?  4:13 PM 05/19/2021  ?  9:48 AM 05/10/2021  ? 10:27 AM 05/10/2021  ?  9:52 AM  ?BP/Weight  ?Systolic BP 734 287 681 157 127 112 127  ?Diastolic BP 89 96 93 96 91 67 88  ?Wt. (Lbs) 187 187.6 188 189 186.08    ?BMI 36.52 kg/m2 36.64 kg/m2 36.72 kg/m2 36.91 kg/m2 36.34 kg/m2    ? ? ? ? ? ?Obesity (BMI 30.0-34.9) ? ?Patient re-educated about  the importance of commitment to a  minimum of 150 minutes of exercise per week as able. ? ?The importance of healthy food choices with portion control discussed, as well as eating regularly and within a 12 hour window most days. ?The need to choose "clean , green" food 50 to 75% of the time is discussed, as well as to make water the primary drink and set a goal of 64 ounces water daily. ? ?  ? ?  08/25/2021  ?  8:34 AM 07/25/2021  ?  1:19 PM 07/07/2021  ?  8:27 AM  ?Weight /BMI  ?Weight 187 lb 187 lb 9.6 oz 188 lb  ?Height 5' (1.524 m) 5' (1.524 m) 5' (1.524 m)  ?BMI 36.52 kg/m2 36.64 kg/m2 36.72 kg/m2  ? ? ? ? ?Migraine syndrome ?Controlled, no change in medication ? ? ?Allergic rhinitis ?  Controlled, no change in medication ? ? ?Dyslipidemia ?Hyperlipidemia:Low fat diet discussed and encouraged. ? ? ?Lipid Panel  ?Lab Results  ?Component Value Date  ? CHOL 193 03/30/2021  ? HDL 52 03/30/2021  ? [place] 123 (H) 03/30/2021  ? TRIG 98 03/30/2021  ? CHOLHDL 3.7 03/30/2021  ? ? ? ?Updated lab needed at/ before next visit. ? ? ?Prediabetes ?Patient educated about the importance of limiting  Carbohydrate intake , the need to commit to daily physical activity for a minimum of 30 minutes , and to commit weight loss. ?The fact that changes in all these areas will reduce or eliminate all together the development of diabetes is stressed.  ? ? ?  Latest Ref Rng & Units 03/30/2021  ? 11:27 AM 10/21/2020  ?  9:44 AM 09/08/2020  ?  10:44 AM 03/23/2020  ?  8:08 AM 03/18/2020  ?  9:38 AM  ?Diabetic Labs  ?HbA1c 4.8 - 5.6 %     5.3    ?Chol 100 - 199 mg/dL 193   188     192    ?HDL >39 mg/dL 52   52     45    ?Calc LDL 0 - 99 mg/dL 123   122     133    ?Triglycerides 0 - 149 mg/dL 98   75     76    ?Creatinine 0.57 - 1.00 mg/dL 0.75   0.69   0.67   0.65     ? ? ?  08/25/2021  ?  8:34 AM 07/25/2021  ?  1:19 PM 07/07/2021  ?  8:27 AM 06/15/2021  ?  4:13 PM 05/19/2021  ?  9:48 AM 05/10/2021  ? 10:27 AM 05/10/2021  ?  9:52 AM  ?BP/Weight  ?Systolic BP 476 546 503 546 127 112 127  ?Diastolic BP 89 96 93 96 91 67 88  ?Wt. (Lbs) 187 187.6 188 189 186.08    ?BMI 36.52 kg/m2 36.64 kg/m2 36.72 kg/m2 36.91 kg/m2 36.34 kg/m2    ? ?   ? View : No data to display.  ?  ?  ?  ? ? ?Updated lab needed at/ before next visit. ? ? ? ?

## 2021-08-25 NOTE — Assessment & Plan Note (Signed)
?  Patient re-educated about  the importance of commitment to a  minimum of 150 minutes of exercise per week as able. ? ?The importance of healthy food choices with portion control discussed, as well as eating regularly and within a 12 hour window most days. ?The need to choose "clean , green" food 50 to 75% of the time is discussed, as well as to make water the primary drink and set a goal of 64 ounces water daily. ? ?  ? ?  08/25/2021  ?  8:34 AM 07/25/2021  ?  1:19 PM 07/07/2021  ?  8:27 AM  ?Weight /BMI  ?Weight 187 lb 187 lb 9.6 oz 188 lb  ?Height 5' (1.524 m) 5' (1.524 m) 5' (1.524 m)  ?BMI 36.52 kg/m2 36.64 kg/m2 36.72 kg/m2  ? ? ? ?

## 2021-08-25 NOTE — Assessment & Plan Note (Signed)
Controlled, no change in medication ?DASH diet and commitment to daily physical activity for a minimum of 30 minutes discussed and encouraged, as a part of hypertension management. ?The importance of attaining a healthy weight is also discussed. ? ? ?  08/25/2021  ?  8:34 AM 07/25/2021  ?  1:19 PM 07/07/2021  ?  8:27 AM 06/15/2021  ?  4:13 PM 05/19/2021  ?  9:48 AM 05/10/2021  ? 10:27 AM 05/10/2021  ?  9:52 AM  ?BP/Weight  ?Systolic BP 300 923 300 762 127 112 127  ?Diastolic BP 89 96 93 96 91 67 88  ?Wt. (Lbs) 187 187.6 188 189 186.08    ?BMI 36.52 kg/m2 36.64 kg/m2 36.72 kg/m2 36.91 kg/m2 36.34 kg/m2    ? ? ? ? ?

## 2021-08-25 NOTE — Assessment & Plan Note (Signed)
Hyperlipidemia:Low fat diet discussed and encouraged. ? ? ?Lipid Panel  ?Lab Results  ?Component Value Date  ? CHOL 193 03/30/2021  ? HDL 52 03/30/2021  ? [place] 123 (H) 03/30/2021  ? TRIG 98 03/30/2021  ? CHOLHDL 3.7 03/30/2021  ? ? ? ?Updated lab needed at/ before next visit. ? ?

## 2021-08-25 NOTE — Assessment & Plan Note (Signed)
Controlled, no change in medication  

## 2021-08-25 NOTE — Patient Instructions (Addendum)
Annual exam in 4 months, call if you need me sooner ? ?Lipid, chem 7 and EGFr, TSH and HBA1C andd vit D today ? ?Please change eating habits as discussed ? ?Weight loss goal of 10 pounds ? ? ?It is important that you exercise regularly at least 30 minutes 5 times a week. If you develop chest pain, have severe difficulty breathing, or feel very tired, stop exercising immediately and seek medical attention  ? ?Thanks for choosing [facility], we consider it a privelige to serve you. ? ? ? ?

## 2021-08-25 NOTE — Assessment & Plan Note (Signed)
Patient educated about the importance of limiting  Carbohydrate intake , the need to commit to daily physical activity for a minimum of 30 minutes , and to commit weight loss. ?The fact that changes in all these areas will reduce or eliminate all together the development of diabetes is stressed.  ? ? ?  Latest Ref Rng & Units 03/30/2021  ? 11:27 AM 10/21/2020  ?  9:44 AM 09/08/2020  ? 10:44 AM 03/23/2020  ?  8:08 AM 03/18/2020  ?  9:38 AM  ?Diabetic Labs  ?HbA1c 4.8 - 5.6 %     5.3    ?Chol 100 - 199 mg/dL 193   188     192    ?HDL >39 mg/dL 52   52     45    ?Calc LDL 0 - 99 mg/dL 123   122     133    ?Triglycerides 0 - 149 mg/dL 98   75     76    ?Creatinine 0.57 - 1.00 mg/dL 0.75   0.69   0.67   0.65     ? ? ?  08/25/2021  ?  8:34 AM 07/25/2021  ?  1:19 PM 07/07/2021  ?  8:27 AM 06/15/2021  ?  4:13 PM 05/19/2021  ?  9:48 AM 05/10/2021  ? 10:27 AM 05/10/2021  ?  9:52 AM  ?BP/Weight  ?Systolic BP 295 188 416 606 127 112 127  ?Diastolic BP 89 96 93 96 91 67 88  ?Wt. (Lbs) 187 187.6 188 189 186.08    ?BMI 36.52 kg/m2 36.64 kg/m2 36.72 kg/m2 36.91 kg/m2 36.34 kg/m2    ? ?   ? View : No data to display.  ?  ?  ?  ? ? ?Updated lab needed at/ before next visit. ? ?

## 2021-08-26 LAB — BMP8+EGFR
BUN/Creatinine Ratio: 7 — ABNORMAL LOW (ref 9–23)
BUN: 5 mg/dL — ABNORMAL LOW (ref 6–24)
CO2: 25 mmol/L (ref 20–29)
Calcium: 9.6 mg/dL (ref 8.7–10.2)
Chloride: 101 mmol/L (ref 96–106)
Creatinine, Ser: 0.7 mg/dL (ref 0.57–1.00)
Glucose: 96 mg/dL (ref 70–99)
Potassium: 3.4 mmol/L — ABNORMAL LOW (ref 3.5–5.2)
Sodium: 140 mmol/L (ref 134–144)
eGFR: 109 mL/min/{1.73_m2} (ref >59)

## 2021-08-26 LAB — VITAMIN D 25 HYDROXY (VIT D DEFICIENCY, FRACTURES): Vit D, 25-Hydroxy: 25.4 ng/mL — ABNORMAL LOW (ref 30.0–100.0)

## 2021-08-26 LAB — LIPID PANEL
Chol/HDL Ratio: 3.8 ratio (ref 0.0–4.4)
Cholesterol, Total: 200 mg/dL — ABNORMAL HIGH (ref 100–199)
HDL: 53 mg/dL (ref >39)
LDL Chol Calc (NIH): 132 mg/dL — ABNORMAL HIGH (ref 0–99)
Triglycerides: 84 mg/dL (ref 0–149)
VLDL Cholesterol Cal: 15 mg/dL (ref 5–40)

## 2021-08-26 LAB — HEMOGLOBIN A1C
Est. average glucose Bld gHb Est-mCnc: 111 mg/dL
Hgb A1c MFr Bld: 5.5 % (ref 4.8–5.6)

## 2021-08-26 LAB — TSH: TSH: 1.09 u[IU]/mL (ref 0.450–4.500)

## 2021-08-31 ENCOUNTER — Encounter: Payer: Self-pay | Admitting: Family Medicine

## 2021-09-01 ENCOUNTER — Telehealth: Payer: BC Managed Care – PPO | Admitting: Family Medicine

## 2021-09-01 ENCOUNTER — Encounter: Payer: Self-pay | Admitting: Family Medicine

## 2021-09-01 ENCOUNTER — Encounter (HOSPITAL_COMMUNITY): Payer: Self-pay | Admitting: Emergency Medicine

## 2021-09-01 ENCOUNTER — Emergency Department (HOSPITAL_COMMUNITY): Payer: BC Managed Care – PPO

## 2021-09-01 ENCOUNTER — Other Ambulatory Visit: Payer: Self-pay

## 2021-09-01 ENCOUNTER — Emergency Department (HOSPITAL_COMMUNITY)
Admission: EM | Admit: 2021-09-01 | Discharge: 2021-09-01 | Disposition: A | Discharge status: Home / Self Care | Payer: BC Managed Care – PPO | Attending: Emergency Medicine | Admitting: Emergency Medicine

## 2021-09-01 DIAGNOSIS — Z79899 Other long term (current) drug therapy: Secondary | ICD-10-CM | POA: Diagnosis not present

## 2021-09-01 DIAGNOSIS — Z91040 Latex allergy status: Secondary | ICD-10-CM | POA: Diagnosis not present

## 2021-09-01 DIAGNOSIS — R0981 Nasal congestion: Secondary | ICD-10-CM | POA: Diagnosis not present

## 2021-09-01 DIAGNOSIS — R1032 Left lower quadrant pain: Secondary | ICD-10-CM | POA: Insufficient documentation

## 2021-09-01 DIAGNOSIS — Z20822 Contact with and (suspected) exposure to covid-19: Secondary | ICD-10-CM | POA: Insufficient documentation

## 2021-09-01 DIAGNOSIS — I10 Essential (primary) hypertension: Secondary | ICD-10-CM | POA: Diagnosis not present

## 2021-09-01 DIAGNOSIS — J069 Acute upper respiratory infection, unspecified: Secondary | ICD-10-CM | POA: Insufficient documentation

## 2021-09-01 DIAGNOSIS — R062 Wheezing: Secondary | ICD-10-CM | POA: Diagnosis not present

## 2021-09-01 LAB — CBC WITH DIFFERENTIAL/PLATELET
Abs Immature Granulocytes: 0.05 10*3/uL (ref 0.00–0.07)
Basophils Absolute: 0 10*3/uL (ref 0.0–0.1)
Basophils Relative: 1 %
Eosinophils Absolute: 0.2 10*3/uL (ref 0.0–0.5)
Eosinophils Relative: 3 %
HCT: 43.4 % (ref 36.0–46.0)
Hemoglobin: 14.4 g/dL (ref 12.0–15.0)
Immature Granulocytes: 1 %
Lymphocytes Relative: 20 %
Lymphs Abs: 1.5 10*3/uL (ref 0.7–4.0)
MCH: 29.3 pg (ref 26.0–34.0)
MCHC: 33.2 g/dL (ref 30.0–36.0)
MCV: 88.4 fL (ref 80.0–100.0)
Monocytes Absolute: 0.5 10*3/uL (ref 0.1–1.0)
Monocytes Relative: 6 %
Neutro Abs: 5.4 10*3/uL (ref 1.7–7.7)
Neutrophils Relative %: 69 %
Platelets: 230 10*3/uL (ref 150–400)
RBC: 4.91 MIL/uL (ref 3.87–5.11)
RDW: 13.4 % (ref 11.5–15.5)
WBC: 7.8 10*3/uL (ref 4.0–10.5)
nRBC: 0 % (ref 0.0–0.2)

## 2021-09-01 LAB — RESP PANEL BY RT-PCR (FLU A&B, COVID) ARPGX2
Influenza A by PCR: NEGATIVE
Influenza B by PCR: NEGATIVE
SARS Coronavirus 2 by RT PCR: NEGATIVE

## 2021-09-01 LAB — URINALYSIS, ROUTINE W REFLEX MICROSCOPIC
Bilirubin Urine: NEGATIVE
Glucose, UA: NEGATIVE mg/dL
Hgb urine dipstick: NEGATIVE
Ketones, ur: NEGATIVE mg/dL
Leukocytes,Ua: NEGATIVE
Nitrite: NEGATIVE
Protein, ur: NEGATIVE mg/dL
Specific Gravity, Urine: 1.011 (ref 1.005–1.030)
pH: 7 (ref 5.0–8.0)

## 2021-09-01 LAB — COMPREHENSIVE METABOLIC PANEL
ALT: 29 U/L (ref 0–44)
AST: 24 U/L (ref 15–41)
Albumin: 4 g/dL (ref 3.5–5.0)
Alkaline Phosphatase: 107 U/L (ref 38–126)
Anion gap: 7 (ref 5–15)
BUN: 7 mg/dL (ref 6–20)
CO2: 26 mmol/L (ref 22–32)
Calcium: 9 mg/dL (ref 8.9–10.3)
Chloride: 106 mmol/L (ref 98–111)
Creatinine, Ser: 0.7 mg/dL (ref 0.44–1.00)
GFR, Estimated: >60 mL/min (ref >60)
Glucose, Bld: 96 mg/dL (ref 70–99)
Potassium: 3.1 mmol/L — ABNORMAL LOW (ref 3.5–5.1)
Sodium: 139 mmol/L (ref 135–145)
Total Bilirubin: 0.6 mg/dL (ref 0.3–1.2)
Total Protein: 7.7 g/dL (ref 6.5–8.1)

## 2021-09-01 LAB — I-STAT BETA HCG BLOOD, ED (MC, WL, AP ONLY): I-stat hCG, quantitative: <5 m[IU]/mL (ref <5)

## 2021-09-01 LAB — GROUP A STREP BY PCR: Group A Strep by PCR: NOT DETECTED

## 2021-09-01 LAB — PREGNANCY, URINE: Preg Test, Ur: NEGATIVE

## 2021-09-01 MED ORDER — ALBUTEROL SULFATE HFA 108 (90 BASE) MCG/ACT IN AERS: 1.0000 | INHALATION_SPRAY | Freq: Four times a day (QID) | RESPIRATORY_TRACT | 0 refills | Status: DC | PRN

## 2021-09-01 MED ORDER — ONDANSETRON 4 MG PO TBDP
4.0000 mg | ORAL_TABLET | Freq: Once | ORAL | Status: AC
  Administered 2021-09-01: 4 mg via ORAL
  Filled 2021-09-01: qty 1

## 2021-09-01 MED ORDER — FLUTICASONE PROPIONATE 50 MCG/ACT NA SUSP: 1.0000 | Freq: Every day | NASAL | 2 refills | Status: DC

## 2021-09-01 MED ORDER — CETIRIZINE HCL 10 MG PO TABS: 10.0000 mg | ORAL_TABLET | Freq: Every day | ORAL | 0 refills | Status: DC

## 2021-09-01 NOTE — ED Provider Notes (Signed)
?[facility] ?Provider Note ? ? ?CSN: [id number] ?Arrival date & time: 09/01/21  1517 ? ?  ? ?History ? ?Chief Complaint  ?Patient presents with  ? Abdominal Pain  ? ? ?Rebecca Boyd is a 46 y.o. female with chief complaint of a variety of symptoms increasing since Monday.  First began having congestion and sinus pressure with a sore throat.  Next day developed abdominal pain, nausea, vomiting.  Abdominal pain described as constant and in the pelvic region.  Denies vaginal discharge, vaginal bleeding, urinary symptoms.  LMP 2 weeks ago.  Denies possibility of pregnancy.  No previous abdominal surgeries or previous renal calculi.  Denies constipation, diarrhea, or abnormal bowel movements.  Denies fever, stiff neck, cough, or difficulty swallowing.  Endorses wheezing starting today and has a hx of asthma.  Currently takes singulair.  Hx of HTN, migraines, dyslipidemia, prediabetes. ? ?The history is provided by the patient and medical records.  ?Abdominal Pain ?Associated symptoms: nausea, sore throat and vomiting   ? ?  ? ?Home Medications ?Prior to Admission medications   ?Medication Sig Start Date End Date Taking? Authorizing Provider  ?albuterol (VENTOLIN HFA) 108 (90 Base) MCG/ACT inhaler Inhale 1-2 puffs into the lungs every 6 (six) hours as needed for wheezing or shortness of breath. 09/01/21  Yes Prince Rome, PA-C  ?cetirizine (ZYRTEC ALLERGY) 10 MG tablet Take 1 tablet (10 mg total) by mouth daily for 14 days. 09/01/21 09/15/21 Yes Prince Rome, PA-C  ?fluticasone (FLONASE) 50 MCG/ACT nasal spray Place 1 spray into both nostrils daily. 09/01/21  Yes Prince Rome, PA-C  ?albuterol (VENTOLIN HFA) 108 (90 Base) MCG/ACT inhaler Inhale 2 puffs into the lungs every 6 (six) hours as needed (Wheezing/SOB).    [provider]  ?amLODipine (NORVASC) 10 MG tablet Take 1 tablet (10 mg total) by mouth daily. 08/09/21   Fayrene Helper, MD  ?betamethasone dipropionate 0.05  % cream Apply topically 2 (two) times daily. 06/14/20   Fayrene Helper, MD  ?cyclobenzaprine (FLEXERIL) 10 MG tablet Take 1 tablet (10 mg total) by mouth 2 (two) times daily as needed for muscle spasms. 04/23/21   Azucena Cecil, PA-C  ?Erenumab-aooe (AIMOVIG) 70 MG/ML SOAJ Inject 70 mg into the skin every 30 (thirty) days. 08/12/19   Suzzanne Cloud, NP  ?ergocalciferol (VITAMIN D2) 1.25 MG (50000 UT) capsule Take 1 capsule (50,000 Units total) by mouth once a week. One capsule once weekly 07/07/21   Fayrene Helper, MD  ?ibuprofen (ADVIL) 800 MG tablet Take 1 tablet (800 mg total) by mouth every 8 (eight) hours as needed. 07/25/21   Carole Civil, MD  ?imipramine (TOFRANIL) 25 MG tablet Take 25 mg by mouth at bedtime. 06/16/21   [provider]  ?metroNIDAZOLE (METROGEL VAGINAL) 0.75 % vaginal gel Place 1 Applicatorful vaginally 2 (two) times daily. 07/07/21   Fayrene Helper, MD  ?montelukast (SINGULAIR) 10 MG tablet Take 1 tablet (10 mg total) by mouth at bedtime. 06/15/21   Fayrene Helper, MD  ?oxyCODONE-acetaminophen (PERCOCET/ROXICET) 5-325 MG tablet Take 1-2 tablets by mouth 2 (two) times daily as needed for severe pain. 03/10/20   [provider]  ?spironolactone (ALDACTONE) 50 MG tablet Take 1 tablet (50 mg total) by mouth daily. 07/07/21   Fayrene Helper, MD  ?SUMAtriptan Dellis Filbert) 6 MG/0.5ML SOLN injection Use one injection at onset of migraine.  May repeat in 2 hrs, if needed.  Max dose: 2 inj/day. This is  a 30 day prescription. 08/12/19   Suzzanne Cloud, NP  ?topiramate (TOPAMAX) 100 MG tablet Take 1 tablet (100 mg total) by mouth 2 (two) times daily. 08/12/19   Suzzanne Cloud, NP  ?   ? ?Allergies    ?Diphenhydramine hcl, Orange fruit, and Latex   ? ?Review of Systems   ?Review of Systems  ?HENT:  Positive for congestion, sinus pressure and sore throat.   ?Respiratory:  Positive for wheezing.   ?Gastrointestinal:  Positive for abdominal pain (Suprapubic, LLQ),  nausea and vomiting.  ? ?Physical Exam ?Updated Vital Signs ?BP (!) 127/97   Pulse 99   Temp 98.2 ?F (36.8 ?C) (Oral)   Resp 17   Ht 5' (1.524 m)   Wt 83.9 kg   SpO2 96%   BMI 36.13 kg/m?  ?Physical Exam ?Vitals and nursing note reviewed.  ?Constitutional:   ?   General: She is not in acute distress. ?   Appearance: She is well-developed.  ?HENT:  ?   Head: Normocephalic and atraumatic.  ?   Right Ear: Tympanic membrane, ear canal and external ear normal.  ?   Left Ear: Tympanic membrane, ear canal and external ear normal.  ?   Nose: Nose normal.  ?   Right Turbinates: Enlarged.  ?   Left Turbinates: Enlarged.  ?   Mouth/Throat:  ?   Lips: Pink.  ?   Mouth: Mucous membranes are moist.  ?   Tongue: No lesions. Tongue does not deviate from midline.  ?   Pharynx: Oropharynx is clear. Uvula midline. Posterior oropharyngeal erythema present. No pharyngeal swelling, oropharyngeal exudate or uvula swelling.  ?   Tonsils: No tonsillar exudate.  ?   Comments: Mild erythema of the oropharynx. ?Eyes:  ?   Conjunctiva/sclera: Conjunctivae normal.  ?Cardiovascular:  ?   Rate and Rhythm: Normal rate and regular rhythm.  ?   Heart sounds: No murmur heard. ?   Comments: HR 96 bpm on exam ?Pulmonary:  ?   Effort: Pulmonary effort is normal. No respiratory distress.  ?   Breath sounds: Normal breath sounds. No wheezing.  ?Chest:  ?   Chest wall: No tenderness.  ?Abdominal:  ?   General: Bowel sounds are normal.  ?   Palpations: Abdomen is soft.  ?   Tenderness: There is abdominal tenderness in the suprapubic area and left lower quadrant. There is no right CVA tenderness or left CVA tenderness. Negative signs include Murphy's sign and McBurney's sign.  ?Musculoskeletal:     ?   General: No swelling.  ?   Cervical back: Neck supple.  ?Skin: ?   General: Skin is warm and dry.  ?   Capillary Refill: Capillary refill takes less than 2 seconds.  ?Neurological:  ?   Mental Status: She is alert and oriented to person, place, and time.   ?Psychiatric:     ?   Mood and Affect: Mood normal.  ? ? ?ED Results / Procedures / Treatments   ?Labs ?(all labs ordered are listed, but only abnormal results are displayed) ?Labs Reviewed  ?COMPREHENSIVE METABOLIC PANEL - Abnormal; Notable for the following components:  ?    Result Value  ? Potassium 3.1 (*)   ? All other components within normal limits  ?RESP PANEL BY RT-PCR (FLU A&B, COVID) ARPGX2  ?GROUP A STREP BY PCR  ?URINE CULTURE  ?CBC WITH DIFFERENTIAL/PLATELET  ?URINALYSIS, ROUTINE W REFLEX MICROSCOPIC  ?PREGNANCY, URINE  ?I-STAT BETA HCG BLOOD, ED (  [facility], [facility], [facility] ONLY)  ?POC URINE PREG, ED  ? ? ?EKG ?None ? ?Radiology ?DG Chest 2 View ? ?Result Date: 09/01/2021 ?CLINICAL DATA:  Wheezing, URI symptoms. EXAM: CHEST - 2 VIEW COMPARISON:  Radiograph February 04, 2018. FINDINGS: The heart size and mediastinal contours are within normal limits. Central bronchial wall thickening with streaky bibasilar opacities. No pleural effusion. No pneumothorax. The visualized skeletal structures are unremarkable. IMPRESSION: Central bronchial wall thickening with streaky bibasilar opacities, which may reflect viral process or reactive airways disease. Electronically Signed   By: Dahlia Bailiff M.D.   On: 09/01/2021 10:01   ? ?Procedures ?Procedures  ? ? ?Medications Ordered in ED ?Medications  ?ondansetron (ZOFRAN-ODT) disintegrating tablet 4 mg (4 mg Oral Given 09/01/21 1006)  ? ? ?ED Course/ Medical Decision Making/ A&P ?  ?                        ?Medical Decision Making ?Amount and/or Complexity of Data Reviewed ?External Data Reviewed: notes. ?Labs: ordered. Decision-making details documented in ED Course. ?Radiology: ordered and independent interpretation performed. Decision-making details documented in ED Course. ?ECG/medicine tests: ordered and independent interpretation performed. Decision-making details documented in ED Course. ? ?Risk ?OTC drugs. ?Prescription drug management. ? ? ?46 y.o. female presents to the ED for  concern of Abdominal Pain ?  ?This involves an extensive number of treatment options, and is a complaint that carries with it a high risk of complications and morbidity.  The emergent differential diagnosis p

## 2021-09-01 NOTE — ED Triage Notes (Signed)
Pt c/o abdominal pain, sore throat, headache, N/V--vomited 3 times yesterday. Sx worsening since  Monday  ?

## 2021-09-01 NOTE — Discharge Instructions (Addendum)
3 prescriptions have been sent to your pharmacy, which include: ?1.Albuterol-take 1-2 puffs every 6 hours as needed for chest tightness/breathing assistance ?2.Zyrtec-take 1 tablet daily for 1 week for congestion relief ?3.Flonase-Place 2 sprays into both nostrils daily for 10 days for congestion relief ? ?Try to utilize hot water with honey, as this is one of the best cough suppressants as discussed.  You may also utilize over-the-counter's's Cepacol lozenges for additional relief. ? ?Continue to focus on remaining hydrated and maintaining adequate nutritional intake ? ?Follow-up with your primary care within the next 3 to 5 days for reevaluation and continued medical management ? ?Return to the ED for new or worsening symptoms as discussed ?

## 2021-09-02 ENCOUNTER — Other Ambulatory Visit: Payer: Self-pay | Admitting: Family Medicine

## 2021-09-02 ENCOUNTER — Encounter: Payer: Self-pay | Admitting: Family Medicine

## 2021-09-02 MED ORDER — PROMETHAZINE-DM 6.25-15 MG/5ML PO SYRP: ORAL_SOLUTION | ORAL | 0 refills | Status: DC

## 2021-09-02 NOTE — Progress Notes (Addendum)
Dextromethorphan prescribed for cough,  ? ?

## 2021-09-05 ENCOUNTER — Ambulatory Visit: Payer: BC Managed Care – PPO | Admitting: Orthopedic Surgery

## 2021-09-05 ENCOUNTER — Ambulatory Visit (INDEPENDENT_AMBULATORY_CARE_PROVIDER_SITE_OTHER): Payer: BC Managed Care – PPO | Admitting: Orthopedic Surgery

## 2021-09-05 ENCOUNTER — Other Ambulatory Visit: Payer: Self-pay | Admitting: Family Medicine

## 2021-09-05 DIAGNOSIS — M7712 Lateral epicondylitis, left elbow: Secondary | ICD-10-CM

## 2021-09-05 NOTE — Progress Notes (Signed)
?  Phenergan dm already prescribed ?

## 2021-09-05 NOTE — Patient Instructions (Signed)
Continue bracing ?Ice daily ?Stretching and strengthening left elbow exercises ?Continue ibuprofen or use of liniment such as Biofreeze, Max freeze or Aspercreme ? ?You have received an injection of steroids into the joint. 15% of patients will have increased pain within the 24 hours postinjection.  ? ?This is transient and will go away.  ? ?We recommend that you use ice packs on the injection site for 20 minutes every 2 hours and extra strength Tylenol 2 tablets every 8 as needed until the pain resolves. ? ?If you continue to have pain after taking the Tylenol and using the ice please call the office for further instructions. ? ?

## 2021-09-05 NOTE — Progress Notes (Signed)
FOLLOW UP  ? ?Encounter Diagnosis  ?Name Primary?  ? Lateral epicondylitis, left elbow Yes  ? ? ? ?Chief Complaint  ?Patient presents with  ? Elbow Pain  ?  Left, still hurts some days worse than others  ? ? ? ?46 year old female gave an injection started her on exercise program cryotherapy wrist and elbow brace but her activities have not really changed that much.  Still has tenderness over the lateral epicondyle positive wrist extension resistance test and some limitation in extension in terms of range of motion ? ?Recommend continue current treatment regiment emphasizing ice and exercise and repeat the injection.  Follow-up in 6 weeks ? ?Procedure note ?Left elbow lateral epicondyle injection ?Medication 40 mg Depo-Medrol 2 cc 1% lidocaine ?Patient gave consent ?Timeout was completed left elbow confirmed as injection site ?Alcohol and ethyl chloride used to anesthetize the skin and cleaned it ?25-gauge needle used to inject the lateral epicondyle ? ?Return in 6 weeks continue ice exercise and anti-inflammatories ?

## 2021-09-07 DIAGNOSIS — Z79891 Long term (current) use of opiate analgesic: Secondary | ICD-10-CM | POA: Diagnosis not present

## 2021-09-07 DIAGNOSIS — M545 Low back pain, unspecified: Secondary | ICD-10-CM | POA: Diagnosis not present

## 2021-09-07 DIAGNOSIS — M546 Pain in thoracic spine: Secondary | ICD-10-CM | POA: Diagnosis not present

## 2021-09-07 DIAGNOSIS — M542 Cervicalgia: Secondary | ICD-10-CM | POA: Diagnosis not present

## 2021-09-18 ENCOUNTER — Other Ambulatory Visit: Payer: Self-pay | Admitting: Orthopedic Surgery

## 2021-09-18 DIAGNOSIS — M7712 Lateral epicondylitis, left elbow: Secondary | ICD-10-CM

## 2021-09-28 ENCOUNTER — Ambulatory Visit: Payer: BC Managed Care – PPO | Admitting: Family Medicine

## 2021-09-29 ENCOUNTER — Ambulatory Visit: Payer: BC Managed Care – PPO | Admitting: Family Medicine

## 2021-09-30 ENCOUNTER — Ambulatory Visit (INDEPENDENT_AMBULATORY_CARE_PROVIDER_SITE_OTHER): Payer: BC Managed Care – PPO | Admitting: Family Medicine

## 2021-09-30 ENCOUNTER — Encounter: Payer: Self-pay | Admitting: Family Medicine

## 2021-09-30 VITALS — BP 133/89 | HR 82 | Resp 16 | Ht 60.0 in | Wt 185.0 lb

## 2021-09-30 DIAGNOSIS — M25552 Pain in left hip: Secondary | ICD-10-CM

## 2021-09-30 DIAGNOSIS — I10 Essential (primary) hypertension: Secondary | ICD-10-CM | POA: Diagnosis not present

## 2021-09-30 DIAGNOSIS — E669 Obesity, unspecified: Secondary | ICD-10-CM

## 2021-09-30 MED ORDER — GABAPENTIN 300 MG PO CAPS: 300.0000 mg | ORAL_CAPSULE | Freq: Every day | ORAL | 1 refills | Status: DC

## 2021-09-30 MED ORDER — PREDNISONE 10 MG PO TABS: 10.0000 mg | ORAL_TABLET | Freq: Two times a day (BID) | ORAL | 0 refills | Status: DC

## 2021-09-30 MED ORDER — KETOROLAC TROMETHAMINE 60 MG/2ML IM SOLN
60.0000 mg | Freq: Once | INTRAMUSCULAR | Status: AC
  Administered 2021-09-30: 60 mg via INTRAMUSCULAR

## 2021-09-30 MED ORDER — POTASSIUM CHLORIDE CRYS ER 20 MEQ PO TBCR: 20.0000 meq | EXTENDED_RELEASE_TABLET | Freq: Every day | ORAL | 3 refills | Status: DC

## 2021-09-30 MED ORDER — METHYLPREDNISOLONE ACETATE 80 MG/ML IJ SUSP
80.0000 mg | Freq: Once | INTRAMUSCULAR | Status: AC
  Administered 2021-09-30: 80 mg via INTRAMUSCULAR

## 2021-09-30 NOTE — Patient Instructions (Addendum)
F/u as before, call if you need me sooner  Toradol 60 mg and depo medrol 80 mg IM in office , also 5 day course of prednisone  prescribed for left hip and lower leg pain Start  gabapentin at bedtime for pain  You  are referred to Dr Aline Brochure  Thanks for choosing [facility], we consider it a privelige to serve you.

## 2021-10-02 ENCOUNTER — Encounter: Payer: Self-pay | Admitting: Family Medicine

## 2021-10-02 DIAGNOSIS — M25552 Pain in left hip: Secondary | ICD-10-CM | POA: Insufficient documentation

## 2021-10-02 NOTE — Assessment & Plan Note (Signed)
  Patient re-educated about  the importance of commitment to a  minimum of 150 minutes of exercise per week as able.  The importance of healthy food choices with portion control discussed, as well as eating regularly and within a 12 hour window most days. The need to choose "clean , green" food 50 to 75% of the time is discussed, as well as to make water the primary drink and set a goal of 64 ounces water daily.       09/30/2021    4:00 PM 09/01/2021    8:39 AM 08/25/2021    8:34 AM  Weight /BMI  Weight 185 lb 185 lb 187 lb  Height 5' (1.524 m) 5' (1.524 m) 5' (1.524 m)  BMI 36.13 kg/m2 36.13 kg/m2 36.52 kg/m2

## 2021-10-02 NOTE — Progress Notes (Signed)
   Rebecca Boyd     MRN: [id number]      DOB: 1976/01/03   HPI Ms. Sligh is here c/o left hipa pain radiaiitng to ankle , for months, hover has escalated in past 2 to 4 weeks, rated at 8 and negatively impacting her quality of life. No recent inciting trauma, denies low back or buttock pai   ROS Denies recent fever or chills. Denies sinus pressure, nasal congestion, ear pain or sore throat. Denies chest congestion, productive cough or wheezing. Denies chest pains, palpitations and leg swelling Denies abdominal pain, nausea, vomiting,diarrhea or constipation.   Denies dysuria, frequency, hesitancy or incontinence. . Denies headaches, seizures, numbness, or tingling. Denies depression, anxiety or insomnia. Denies skin break down or rash.   PE  BP 133/89   Pulse 82   Resp 16   Ht 5' (1.524 m)   Wt 185 lb (83.9 kg)   SpO2 98%   BMI 36.13 kg/m   Patient alert and oriented and in no cardiopulmonary distress.  HEENT: No facial asymmetry, EOMI,     Neck supple .  Chest: Clear to auscultation bilaterally.  CVS: S1, S2 no murmurs, no S3.Regular rate.  ABD: Soft non tender.   Ext: No edema  MS: Adequate ROM spine, shoulders, and knees, decreased in left hip.  Skin: Intact, no ulcerations or rash noted.  Psych: Good eye contact, normal affect. Memory intact not anxious or depressed appearing.  CNS: CN 2-12 intact, power,  normal throughout.no focal deficits noted.   Assessment & Plan  Left hip pain Uncontrolled.Toradol and depo medrol administered IM in the office , to be followed by a short course of oral prednisone .   Obesity (BMI 30.0-34.9)  Patient re-educated about  the importance of commitment to a  minimum of 150 minutes of exercise per week as able.  The importance of healthy food choices with portion control discussed, as well as eating regularly and within a 12 hour window most days. The need to choose "clean , green" food 50 to 75% of the time is  discussed, as well as to make water the primary drink and set a goal of 64 ounces water daily.       09/30/2021    4:00 PM 09/01/2021    8:39 AM 08/25/2021    8:34 AM  Weight /BMI  Weight 185 lb 185 lb 187 lb  Height 5' (1.524 m) 5' (1.524 m) 5' (1.524 m)  BMI 36.13 kg/m2 36.13 kg/m2 36.52 kg/m2      Essential hypertension Controlled, no change in medication DASH diet and commitment to daily physical activity for a minimum of 30 minutes discussed and encouraged, as a part of hypertension management. The importance of attaining a healthy weight is also discussed.     09/30/2021    4:00 PM 09/01/2021    9:00 AM 09/01/2021    8:39 AM 09/01/2021    8:36 AM 08/25/2021    8:34 AM 07/25/2021    1:19 PM 07/07/2021    8:27 AM  BP/Weight  Systolic BP 373 428  768 115 726 203  Diastolic BP 89 97  98 89 96 93  Wt. (Lbs) 185  185  187 187.6 188  BMI 36.13 kg/m2  36.13 kg/m2  36.52 kg/m2 36.64 kg/m2 36.72 kg/m2

## 2021-10-02 NOTE — Assessment & Plan Note (Signed)
Controlled, no change in medication DASH diet and commitment to daily physical activity for a minimum of 30 minutes discussed and encouraged, as a part of hypertension management. The importance of attaining a healthy weight is also discussed.     09/30/2021    4:00 PM 09/01/2021    9:00 AM 09/01/2021    8:39 AM 09/01/2021    8:36 AM 08/25/2021    8:34 AM 07/25/2021    1:19 PM 07/07/2021    8:27 AM  BP/Weight  Systolic BP 481 859  093 112 162 446  Diastolic BP 89 97  98 89 96 93  Wt. (Lbs) 185  185  187 187.6 188  BMI 36.13 kg/m2  36.13 kg/m2  36.52 kg/m2 36.64 kg/m2 36.72 kg/m2

## 2021-10-02 NOTE — Assessment & Plan Note (Signed)
Uncontrolled.Toradol and depo medrol administered IM in the office , to be followed by a short course of oral prednisone   

## 2021-10-15 ENCOUNTER — Other Ambulatory Visit: Payer: Self-pay

## 2021-10-15 ENCOUNTER — Emergency Department (HOSPITAL_COMMUNITY)
Admission: EM | Admit: 2021-10-15 | Discharge: 2021-10-15 | Disposition: A | Discharge status: Home / Self Care | Payer: BC Managed Care – PPO | Attending: Emergency Medicine | Admitting: Emergency Medicine

## 2021-10-15 ENCOUNTER — Encounter (HOSPITAL_COMMUNITY): Payer: Self-pay | Admitting: Emergency Medicine

## 2021-10-15 ENCOUNTER — Emergency Department (HOSPITAL_COMMUNITY): Payer: BC Managed Care – PPO

## 2021-10-15 DIAGNOSIS — M25551 Pain in right hip: Secondary | ICD-10-CM | POA: Diagnosis not present

## 2021-10-15 DIAGNOSIS — Z91040 Latex allergy status: Secondary | ICD-10-CM | POA: Diagnosis not present

## 2021-10-15 DIAGNOSIS — M79605 Pain in left leg: Secondary | ICD-10-CM | POA: Diagnosis not present

## 2021-10-15 DIAGNOSIS — M79604 Pain in right leg: Secondary | ICD-10-CM

## 2021-10-15 DIAGNOSIS — M5432 Sciatica, left side: Secondary | ICD-10-CM | POA: Diagnosis not present

## 2021-10-15 DIAGNOSIS — M5431 Sciatica, right side: Secondary | ICD-10-CM | POA: Insufficient documentation

## 2021-10-15 DIAGNOSIS — M5442 Lumbago with sciatica, left side: Secondary | ICD-10-CM | POA: Diagnosis not present

## 2021-10-15 DIAGNOSIS — M79661 Pain in right lower leg: Secondary | ICD-10-CM | POA: Insufficient documentation

## 2021-10-15 DIAGNOSIS — M79662 Pain in left lower leg: Secondary | ICD-10-CM | POA: Diagnosis not present

## 2021-10-15 DIAGNOSIS — M25552 Pain in left hip: Secondary | ICD-10-CM | POA: Diagnosis not present

## 2021-10-15 DIAGNOSIS — M545 Low back pain, unspecified: Secondary | ICD-10-CM | POA: Diagnosis not present

## 2021-10-15 DIAGNOSIS — M5441 Lumbago with sciatica, right side: Secondary | ICD-10-CM | POA: Diagnosis not present

## 2021-10-15 LAB — PREGNANCY, URINE: Preg Test, Ur: NEGATIVE

## 2021-10-15 MED ORDER — OXYCODONE-ACETAMINOPHEN 5-325 MG PO TABS
1.0000 | ORAL_TABLET | Freq: Once | ORAL | Status: AC
  Administered 2021-10-15: 1 via ORAL
  Filled 2021-10-15: qty 1

## 2021-10-15 MED ORDER — DICLOFENAC SODIUM 75 MG PO TBEC
75.0000 mg | DELAYED_RELEASE_TABLET | Freq: Two times a day (BID) | ORAL | 0 refills | Status: DC
Start: 2021-10-15 — End: 2022-03-02

## 2021-10-15 NOTE — ED Notes (Signed)
Pt teaching provided on medications that may cause drowsiness. Pt instructed not to drive or operate heavy machinery while taking the prescribed medication. Pt verbalized understanding.   Pt provided discharge instructions and prescription information. Pt was given the opportunity to ask questions and questions were answered.   

## 2021-10-15 NOTE — ED Provider Notes (Signed)
[facility] Provider Note   CSN: [id number] Arrival date & time: 10/15/21  0160     History {Add pertinent medical, surgical, social history, OB history to HPI:1} Chief Complaint  Patient presents with  . Leg Pain    Rebecca Boyd is a 46 y.o. female   The history is provided by the patient.       Home Medications Prior to Admission medications   Medication Sig Start Date End Date Taking? Authorizing Provider  albuterol (VENTOLIN HFA) 108 (90 Base) MCG/ACT inhaler Inhale 2 puffs into the lungs every 6 (six) hours as needed (Wheezing/SOB).    [provider]  albuterol (VENTOLIN HFA) 108 (90 Base) MCG/ACT inhaler Inhale 1-2 puffs into the lungs every 6 (six) hours as needed for wheezing or shortness of breath. 1/0/93   Prince Rome, PA-C  amLODipine (NORVASC) 10 MG tablet Take 1 tablet (10 mg total) by mouth daily. 08/09/21   Fayrene Helper, MD  betamethasone dipropionate 0.05 % cream Apply topically 2 (two) times daily. 06/14/20   Fayrene Helper, MD  cetirizine (ZYRTEC ALLERGY) 10 MG tablet Take 1 tablet (10 mg total) by mouth daily for 14 days. 09/01/21 2/35/57  Prince Rome, PA-C  cyclobenzaprine (FLEXERIL) 10 MG tablet Take 1 tablet (10 mg total) by mouth 2 (two) times daily as needed for muscle spasms. 04/23/21   Azucena Cecil, PA-C  Erenumab-aooe (AIMOVIG) 70 MG/ML SOAJ Inject 70 mg into the skin every 30 (thirty) days. 08/12/19   Suzzanne Cloud, NP  ergocalciferol (VITAMIN D2) 1.25 MG (50000 UT) capsule Take 1 capsule (50,000 Units total) by mouth once a week. One capsule once weekly 07/07/21   Fayrene Helper, MD  fluticasone [facility]) 50 MCG/ACT nasal spray Place 1 spray into both nostrils daily. 07/01/18   Prince Rome, PA-C  gabapentin (NEURONTIN) 300 MG capsule Take 1 capsule (300 mg total) by mouth at bedtime. 09/30/21   Fayrene Helper, MD  imipramine (TOFRANIL) 25 MG tablet Take 25 mg by mouth at  bedtime. 06/16/21   [provider]  montelukast (SINGULAIR) 10 MG tablet Take 1 tablet (10 mg total) by mouth at bedtime. 06/15/21   Fayrene Helper, MD  oxyCODONE-acetaminophen (PERCOCET/ROXICET) 5-325 MG tablet Take 1-2 tablets by mouth 2 (two) times daily as needed for severe pain. 03/10/20   [provider]  potassium chloride SA (KLOR-CON M) 20 MEQ tablet Take 1 tablet (20 mEq total) by mouth daily. 09/30/21   Fayrene Helper, MD  predniSONE (DELTASONE) 10 MG tablet Take 1 tablet (10 mg total) by mouth 2 (two) times daily with a meal. 09/30/21   Fayrene Helper, MD  spironolactone (ALDACTONE) 50 MG tablet Take 1 tablet (50 mg total) by mouth daily. 07/07/21   Fayrene Helper, MD  SUMAtriptan (IMITREX) 6 MG/0.5ML SOLN injection Use one injection at onset of migraine.  May repeat in 2 hrs, if needed.  Max dose: 2 inj/day. This is a 30 day prescription. 08/12/19   Suzzanne Cloud, NP  topiramate (TOPAMAX) 100 MG tablet Take 1 tablet (100 mg total) by mouth 2 (two) times daily. 08/12/19   Suzzanne Cloud, NP      Allergies    Diphenhydramine hcl, Orange fruit, and Latex    Review of Systems   Review of Systems  Physical Exam Updated Vital Signs BP (!) 137/99   Pulse 88   Temp 98.5 F (36.9 C)   Resp  18   Ht 5' (1.524 m)   Wt 83.9 kg   BMI 36.13 kg/m  Physical Exam  ED Results / Procedures / Treatments   Labs (all labs ordered are listed, but only abnormal results are displayed) Labs Reviewed  POC URINE PREG, ED    EKG None  Radiology No results found.  Procedures Procedures  {Document cardiac monitor, telemetry assessment procedure when appropriate:1}  Medications Ordered in ED Medications - No data to display  ED Course/ Medical Decision Making/ A&P                           Medical Decision Making Amount and/or Complexity of Data Reviewed Labs: ordered. Radiology: ordered.   ***  {Document critical care time when  appropriate:1} {Document review of labs and clinical decision tools ie heart score, Chads2Vasc2 etc:1}  {Document your independent review of radiology images, and any outside records:1} {Document your discussion with family members, caretakers, and with consultants:1} {Document social determinants of health affecting pt's care:1} {Document your decision making why or why not admission, treatments were needed:1} Final Clinical Impression(s) / ED Diagnoses Final diagnoses:  None    Rx / DC Orders ED Discharge Orders     None

## 2021-10-15 NOTE — ED Triage Notes (Signed)
PT to ER with c/o bilateral leg pain "for a while".  Pt states pain worsened starting Wednesday to the point that she could not walk.  Pt states she is able to move some at this time.  Pt states pain is sharp and achy in nature and is from her waist down.  Pt has an orthopedic appointment in July.

## 2021-10-15 NOTE — Discharge Instructions (Signed)
Given the bilateral nature and distribution of your pain I really feel that you have a sciatic nerve impingement or irritation which is causing your symptoms.  Refer to the information below about this condition.  I recommend a heating pad 20 minutes 3-4 times daily.  You have been prescribed an anti-inflammatory to see if this will improve the symptom.  You may continue taking your regular oxycodone with this medication.  Plan to see Dr. Aline Brochure as discussed.

## 2021-10-17 ENCOUNTER — Ambulatory Visit (INDEPENDENT_AMBULATORY_CARE_PROVIDER_SITE_OTHER): Payer: BC Managed Care – PPO | Admitting: Orthopedic Surgery

## 2021-10-17 ENCOUNTER — Encounter: Payer: Self-pay | Admitting: Orthopedic Surgery

## 2021-10-17 DIAGNOSIS — M7712 Lateral epicondylitis, left elbow: Secondary | ICD-10-CM

## 2021-10-17 MED ORDER — PREDNISONE 10 MG PO TABS: 10.0000 mg | ORAL_TABLET | Freq: Three times a day (TID) | ORAL | 0 refills | Status: AC

## 2021-10-17 NOTE — Progress Notes (Signed)
FOLLOW UP   Encounter Diagnosis  Name Primary?   Lateral epicondylitis, left elbow Yes   Prior notes: 46 year old female gave an injection started her on exercise program cryotherapy wrist and elbow brace but her activities have not really changed that much.  Still has tenderness over the lateral epicondyle positive wrist extension resistance test and some limitation in extension in terms of range of motion  Recommend continue current treatment regiment emphasizing ice and exercise and repeat the injection.  Follow-up in 6 weeks   Chief Complaint  Patient presents with   Elbow Pain    LT lateral epicondylitis Pain has worsened. 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  She did take 2 days off starting last Thursday and Friday and says is a little bit better but when it was really hurting she could not lift her arm up  She really needs to rest the wrist extensors.  Recommend OOW 10 DAYS   ICE   STOP IBUPROFEN   START PREDNISONE   LIMIT ACTIVITY LEFT ARM   Meds ordered this encounter  Medications   predniSONE (DELTASONE) 10 MG tablet    Sig: Take 1 tablet (10 mg total) by mouth 3 (three) times daily for 10 days.    Dispense:  30 tablet    Refill:  0

## 2021-10-17 NOTE — Patient Instructions (Signed)
OOW 10 DAYS   ICE   STOP IBUPROFEN   START PREDNISONE   LIMIT ACTIVITY LEFT ARM

## 2021-10-18 ENCOUNTER — Ambulatory Visit: Payer: BC Managed Care – PPO | Admitting: Family Medicine

## 2021-10-19 ENCOUNTER — Other Ambulatory Visit (HOSPITAL_COMMUNITY)
Admission: RE | Admit: 2021-10-19 | Discharge: 2021-10-19 | Disposition: A | Discharge status: Home / Self Care | Payer: BC Managed Care – PPO | Source: Ambulatory Visit | Attending: Family Medicine | Admitting: Family Medicine

## 2021-10-19 ENCOUNTER — Ambulatory Visit (INDEPENDENT_AMBULATORY_CARE_PROVIDER_SITE_OTHER): Payer: BC Managed Care – PPO | Admitting: Family Medicine

## 2021-10-19 ENCOUNTER — Encounter: Payer: Self-pay | Admitting: Family Medicine

## 2021-10-19 VITALS — BP 129/87 | HR 92 | Ht 60.0 in | Wt 188.1 lb

## 2021-10-19 DIAGNOSIS — Z113 Encounter for screening for infections with a predominantly sexual mode of transmission: Secondary | ICD-10-CM | POA: Insufficient documentation

## 2021-10-19 DIAGNOSIS — R350 Frequency of micturition: Secondary | ICD-10-CM

## 2021-10-19 DIAGNOSIS — I10 Essential (primary) hypertension: Secondary | ICD-10-CM

## 2021-10-19 DIAGNOSIS — N760 Acute vaginitis: Secondary | ICD-10-CM | POA: Diagnosis not present

## 2021-10-19 DIAGNOSIS — B009 Herpesviral infection, unspecified: Secondary | ICD-10-CM

## 2021-10-19 LAB — POCT URINALYSIS DIP (CLINITEK)
Bilirubin, UA: NEGATIVE
Glucose, UA: NEGATIVE mg/dL
Ketones, POC UA: NEGATIVE mg/dL
Leukocytes, UA: NEGATIVE
Nitrite, UA: NEGATIVE
POC PROTEIN,UA: NEGATIVE
Spec Grav, UA: 1.02 (ref 1.010–1.025)
Urobilinogen, UA: 0.2 E.U./dL
pH, UA: 6 (ref 5.0–8.0)

## 2021-10-19 MED ORDER — ACYCLOVIR 800 MG PO TABS: 800.0000 mg | ORAL_TABLET | Freq: Three times a day (TID) | ORAL | 0 refills | Status: DC

## 2021-10-19 NOTE — Patient Instructions (Signed)
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

## 2021-10-21 LAB — CERVICOVAGINAL ANCILLARY ONLY
Bacterial Vaginitis (gardnerella): NEGATIVE
Candida Glabrata: NEGATIVE
Candida Vaginitis: NEGATIVE
Chlamydia: NEGATIVE
Comment: NEGATIVE
Comment: NEGATIVE
Comment: NEGATIVE
Comment: NEGATIVE
Comment: NEGATIVE
Comment: NORMAL
Neisseria Gonorrhea: NEGATIVE
Trichomonas: NEGATIVE

## 2021-10-24 LAB — URINE CULTURE

## 2021-10-24 NOTE — Assessment & Plan Note (Signed)
specimen sent for STD testing

## 2021-10-24 NOTE — Assessment & Plan Note (Signed)
ccua mildly abn , sent for c/s

## 2021-10-25 ENCOUNTER — Encounter: Payer: Self-pay | Admitting: Radiology

## 2021-11-03 ENCOUNTER — Ambulatory Visit (INDEPENDENT_AMBULATORY_CARE_PROVIDER_SITE_OTHER): Payer: BC Managed Care – PPO | Admitting: Orthopedic Surgery

## 2021-11-03 ENCOUNTER — Encounter: Payer: Self-pay | Admitting: Orthopedic Surgery

## 2021-11-03 VITALS — BP 150/91 | HR 80 | Ht 60.0 in | Wt 186.0 lb

## 2021-11-03 DIAGNOSIS — M5432 Sciatica, left side: Secondary | ICD-10-CM | POA: Diagnosis not present

## 2021-11-03 MED ORDER — PREDNISONE 10 MG (48) PO TBPK
ORAL_TABLET | Freq: Every day | ORAL | 0 refills | Status: DC
Start: 2021-11-03 — End: 2021-12-29

## 2021-11-03 NOTE — Progress Notes (Signed)
Chief Complaint  Patient presents with   Back Pain    Pain starts in ankle and moves up into back     HPI: Rebecca Boyd presents with a 4 month h/o pain left leg down to the foot. She had severe pain and trouble walking on the leg and went to the ER   She has been on IB and flexeril, but still has a dull ache in the leg   The pain wakes her up at night   Past Medical History:  Diagnosis Date   Anemia    Anxiety    Bacterial vaginosis 07/27/2014   Cervical neck pain with evidence of disc disease 10/02/2013   Depressive disorder, not elsewhere classified    Epistaxis 07/09/2019   Family history of breast cancer    Family history of breast cancer in sister 09/25/2018   Family history of colon cancer    Family history of lung cancer    FH: breast cancer in relative when <62 years old 06/10/2018   Dx at age 33   Headache 03/16/2016   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   Initial dx is 05/2013    Hypertension    Migraine    Migraines    Miscarriage 2009   Neck pain    Nondisplaced fracture of fifth metatarsal bone, left foot, subsequent encounter for fracture with routine healing 08/15/18 09/25/2018   Pruritus 09/29/2018   Pruritus ani 09/25/2018   Rash and nonspecific skin eruption 05/14/2017   Vaginitis    cyctitis    BP (!) 150/91   Pulse 80   Ht 5' (1.524 m)   Wt 186 lb (84.4 kg)   BMI 36.33 kg/m    General appearance: Well-developed well-nourished no gross deformities  Cardiovascular normal pulse and perfusion normal color without edema  Neurologically no sensation loss or deficits or pathologic reflexes  Psychological: Awake alert and oriented x3 mood and affect normal  Skin no lacerations or ulcerations no nodularity no palpable masses, no erythema or nodularity  Musculoskeletal:  She's is walking independently without a limp. She has normal rom in both hips and normal SLRs although she feels discomfort on the left with SLR    Imaging  I read outside images   Lumbar  slight coronal plane mal alignment, no disc space narrowing  Hip: normal articulation of ball and socket     A/P  Encounter Diagnosis  Name Primary?   Sciatica of left side Yes   She has a pinched n.  She needs to do the following and see me in 2 weeks   "Pinched nerve"  Rest/OOW 5 days  Heat pad Nsiads-dose pack  MR flexeril Gabapentin increase to include 100 mg bid and 300 mg hs   Meds ordered this encounter  Medications   predniSONE (STERAPRED UNI-PAK 48 TAB) 10 MG (48) TBPK tablet    Sig: Take by mouth daily. 10 mg 121 days as directed    Dispense:  48 tablet    Refill:  0

## 2021-11-03 NOTE — Patient Instructions (Signed)
Ibuprofen stop and start prednisone   Flexeril continue   Gabapentin 300 mg when off   You need 5 days off

## 2021-11-10 ENCOUNTER — Encounter: Payer: Self-pay | Admitting: Orthopedic Surgery

## 2021-11-10 ENCOUNTER — Ambulatory Visit (INDEPENDENT_AMBULATORY_CARE_PROVIDER_SITE_OTHER): Payer: BC Managed Care – PPO | Admitting: Orthopedic Surgery

## 2021-11-10 DIAGNOSIS — M5116 Intervertebral disc disorders with radiculopathy, lumbar region: Secondary | ICD-10-CM | POA: Diagnosis not present

## 2021-11-10 NOTE — Patient Instructions (Signed)
While we are working on your approval please go ahead and call to schedule your appointment with [facility] in at least 2 weeks.    Central Scheduling [phone]    

## 2021-11-10 NOTE — Progress Notes (Signed)
Chief Complaint  Patient presents with   Elbow Pain    Left/ feels good    Back Pain    Left sided back pain continues / not getting better     Rebecca Boyd elbow is better  However her back and leg are still hurting  She has a positive straight leg raise.  She has weakness on extension of the leg at the knee  She did not respond to the muscle relaxer steroids gabapentin  Encounter Diagnosis  Name Primary?   Lumbar disc herniation with radiculopathy Yes    Recommend MRI lumbar spine diagnosis herniated disc L3-4 probably foraminal

## 2021-11-18 ENCOUNTER — Ambulatory Visit: Payer: BC Managed Care – PPO | Admitting: Orthopedic Surgery

## 2021-11-24 ENCOUNTER — Ambulatory Visit (HOSPITAL_COMMUNITY)
Admission: RE | Admit: 2021-11-24 | Discharge: 2021-11-24 | Disposition: A | Discharge status: Home / Self Care | Payer: BC Managed Care – PPO | Source: Ambulatory Visit | Attending: Orthopedic Surgery | Admitting: Orthopedic Surgery

## 2021-11-24 DIAGNOSIS — M5116 Intervertebral disc disorders with radiculopathy, lumbar region: Secondary | ICD-10-CM | POA: Insufficient documentation

## 2021-11-24 DIAGNOSIS — M545 Low back pain, unspecified: Secondary | ICD-10-CM | POA: Diagnosis not present

## 2021-11-30 DIAGNOSIS — Z79891 Long term (current) use of opiate analgesic: Secondary | ICD-10-CM | POA: Diagnosis not present

## 2021-11-30 DIAGNOSIS — M546 Pain in thoracic spine: Secondary | ICD-10-CM | POA: Diagnosis not present

## 2021-11-30 DIAGNOSIS — M542 Cervicalgia: Secondary | ICD-10-CM | POA: Diagnosis not present

## 2021-11-30 DIAGNOSIS — M545 Low back pain, unspecified: Secondary | ICD-10-CM | POA: Diagnosis not present

## 2021-12-01 ENCOUNTER — Encounter: Payer: Self-pay | Admitting: Orthopedic Surgery

## 2021-12-01 ENCOUNTER — Ambulatory Visit (INDEPENDENT_AMBULATORY_CARE_PROVIDER_SITE_OTHER): Payer: BC Managed Care – PPO | Admitting: Orthopedic Surgery

## 2021-12-01 DIAGNOSIS — M47816 Spondylosis without myelopathy or radiculopathy, lumbar region: Secondary | ICD-10-CM | POA: Diagnosis not present

## 2021-12-01 MED ORDER — GABAPENTIN 300 MG PO CAPS: 300.0000 mg | ORAL_CAPSULE | Freq: Every day | ORAL | 1 refills | Status: DC

## 2021-12-01 MED ORDER — CYCLOBENZAPRINE HCL 10 MG PO TABS: 10.0000 mg | ORAL_TABLET | Freq: Two times a day (BID) | ORAL | 0 refills | Status: DC | PRN

## 2021-12-01 NOTE — Patient Instructions (Signed)
Continue tylenol 500 mg every 6 hrs   Muscle relaxer  Gabapentin  And prednisone

## 2021-12-01 NOTE — Addendum Note (Signed)
Addended by: Elizabeth Sauer on: 12/01/2021 12:04 PM   Modules accepted: Orders

## 2021-12-01 NOTE — Progress Notes (Signed)
Chief Complaint  Patient presents with   Results    MRI results/ back still feels about the same left leg painful     Rebecca Boyd comes in for her MRI.  I saw it and I saw the report.  Says she has facet arthritis at L4-5 and L5-S1 no protruding disc  Still has pain going down left leg  Recommend she continue Tylenol 500 mg every 6 prednisone a muscle relaxer and gabapentin  Recommend epidural steroid injections  Patient should follow-up after second injection  Meds ordered this encounter  Medications   cyclobenzaprine (FLEXERIL) 10 MG tablet    Sig: Take 1 tablet (10 mg total) by mouth 2 (two) times daily as needed for muscle spasms.    Dispense:  20 tablet    Refill:  0   gabapentin (NEURONTIN) 300 MG capsule    Sig: Take 1 capsule (300 mg total) by mouth at bedtime.    Dispense:  30 capsule    Refill:  1

## 2021-12-02 ENCOUNTER — Other Ambulatory Visit: Payer: Self-pay | Admitting: Orthopedic Surgery

## 2021-12-02 DIAGNOSIS — M47816 Spondylosis without myelopathy or radiculopathy, lumbar region: Secondary | ICD-10-CM

## 2021-12-08 ENCOUNTER — Ambulatory Visit
Admission: RE | Admit: 2021-12-08 | Discharge: 2021-12-08 | Disposition: A | Discharge status: Home / Self Care | Payer: BC Managed Care – PPO | Source: Ambulatory Visit | Attending: Orthopedic Surgery | Admitting: Orthopedic Surgery

## 2021-12-08 DIAGNOSIS — M4727 Other spondylosis with radiculopathy, lumbosacral region: Secondary | ICD-10-CM | POA: Diagnosis not present

## 2021-12-08 DIAGNOSIS — M47816 Spondylosis without myelopathy or radiculopathy, lumbar region: Secondary | ICD-10-CM

## 2021-12-08 MED ORDER — IOPAMIDOL (ISOVUE-M 200) INJECTION 41%
1.0000 mL | Freq: Once | INTRAMUSCULAR | Status: AC
  Administered 2021-12-08: 1 mL via EPIDURAL

## 2021-12-08 MED ORDER — METHYLPREDNISOLONE ACETATE 40 MG/ML INJ SUSP (RADIOLOG
80.0000 mg | Freq: Once | INTRAMUSCULAR | Status: AC
  Administered 2021-12-08: 80 mg via EPIDURAL

## 2021-12-08 NOTE — Discharge Instructions (Signed)

## 2021-12-17 ENCOUNTER — Encounter (HOSPITAL_COMMUNITY): Payer: Self-pay

## 2021-12-17 ENCOUNTER — Other Ambulatory Visit: Payer: Self-pay

## 2021-12-17 ENCOUNTER — Emergency Department (HOSPITAL_COMMUNITY)
Admission: EM | Admit: 2021-12-17 | Discharge: 2021-12-17 | Disposition: A | Discharge status: Home / Self Care | Payer: BC Managed Care – PPO | Attending: Emergency Medicine | Admitting: Emergency Medicine

## 2021-12-17 DIAGNOSIS — Z91040 Latex allergy status: Secondary | ICD-10-CM | POA: Insufficient documentation

## 2021-12-17 DIAGNOSIS — S01312A Laceration without foreign body of left ear, initial encounter: Secondary | ICD-10-CM | POA: Insufficient documentation

## 2021-12-17 DIAGNOSIS — X58XXXA Exposure to other specified factors, initial encounter: Secondary | ICD-10-CM | POA: Insufficient documentation

## 2021-12-17 DIAGNOSIS — S0991XA Unspecified injury of ear, initial encounter: Secondary | ICD-10-CM | POA: Diagnosis not present

## 2021-12-17 MED ORDER — NEOMYCIN-POLYMYXIN-HC 1 % OT SOLN
3.0000 [drp] | Freq: Four times a day (QID) | OTIC | Status: DC
  Administered 2021-12-17: 3 [drp] via OTIC
  Filled 2021-12-17: qty 10

## 2021-12-17 NOTE — ED Triage Notes (Signed)
Pt states that her left ear has been bleeding since yesterday at 2pm with no known injury. Bleeding controlled at this time.

## 2021-12-17 NOTE — ED Provider Notes (Signed)
[facility] EMERGENCY DEPARTMENT Provider Note   CSN: [id number] Arrival date & time: 12/17/21  7341     History  Chief Complaint  Patient presents with   Ear Drainage    DANIRA NYLANDER is a 46 y.o. female.  HPI Patient presents with concern of bleeding from her left ear.  Onset was about 2 PM, 15 hours ago.  She notes that she was cleaning her ear with a washcloth prior to the onset.  Since that time she has had bleeding, which has slowed somewhat but is persistent.  There is some associated pain, but no hearing loss, no other complaints.    Home Medications Prior to Admission medications   Medication Sig Start Date End Date Taking? Authorizing Provider  acyclovir (ZOVIRAX) 800 MG tablet Take 1 tablet (800 mg total) by mouth 3 (three) times daily. 10/19/21   Fayrene Helper, MD  albuterol (VENTOLIN HFA) 108 (90 Base) MCG/ACT inhaler Inhale 2 puffs into the lungs every 6 (six) hours as needed (Wheezing/SOB).    [provider]  albuterol (VENTOLIN HFA) 108 (90 Base) MCG/ACT inhaler Inhale 1-2 puffs into the lungs every 6 (six) hours as needed for wheezing or shortness of breath. 01/01/78   Prince Rome, PA-C  amLODipine (NORVASC) 10 MG tablet Take 1 tablet (10 mg total) by mouth daily. 08/09/21   Fayrene Helper, MD  betamethasone dipropionate 0.05 % cream Apply topically 2 (two) times daily. 06/14/20   Fayrene Helper, MD  cetirizine (ZYRTEC ALLERGY) 10 MG tablet Take 1 tablet (10 mg total) by mouth daily for 14 days. 09/01/21 0/24/09  Prince Rome, PA-C  cyclobenzaprine (FLEXERIL) 10 MG tablet Take 1 tablet (10 mg total) by mouth 2 (two) times daily as needed for muscle spasms. 12/01/21   Carole Civil, MD  diclofenac (VOLTAREN) 75 MG EC tablet Take 1 tablet (75 mg total) by mouth 2 (two) times daily. 10/15/21   Idol, Almyra Free, PA-C  Erenumab-aooe (AIMOVIG) 70 MG/ML SOAJ Inject 70 mg into the skin every 30 (thirty) days. 08/12/19   Suzzanne Cloud, NP   ergocalciferol (VITAMIN D2) 1.25 MG (50000 UT) capsule Take 1 capsule (50,000 Units total) by mouth once a week. One capsule once weekly 07/07/21   Fayrene Helper, MD  fluticasone [facility]) 50 MCG/ACT nasal spray Place 1 spray into both nostrils daily. 11/01/51   Prince Rome, PA-C  gabapentin (NEURONTIN) 300 MG capsule Take 1 capsule (300 mg total) by mouth at bedtime. 12/01/21   Carole Civil, MD  ibuprofen (ADVIL) 800 MG tablet Take 800 mg by mouth every 8 (eight) hours as needed.    [provider]  imipramine (TOFRANIL) 25 MG tablet Take 25 mg by mouth at bedtime. 06/16/21   [provider]  montelukast (SINGULAIR) 10 MG tablet Take 1 tablet (10 mg total) by mouth at bedtime. 06/15/21   Fayrene Helper, MD  oxyCODONE-acetaminophen (PERCOCET/ROXICET) 5-325 MG tablet Take 1-2 tablets by mouth 2 (two) times daily as needed for severe pain. 03/10/20   [provider]  potassium chloride SA (KLOR-CON M) 20 MEQ tablet Take 1 tablet (20 mEq total) by mouth daily. 09/30/21   Fayrene Helper, MD  predniSONE (STERAPRED UNI-PAK 48 TAB) 10 MG (48) TBPK tablet Take by mouth daily. 10 mg 121 days as directed 11/03/21   Carole Civil, MD  spironolactone (ALDACTONE) 50 MG tablet Take 1 tablet (50 mg total) by mouth daily. 07/07/21   Moshe Cipro,  Norwood Levo, MD  SUMAtriptan (IMITREX) 6 MG/0.5ML SOLN injection Use one injection at onset of migraine.  May repeat in 2 hrs, if needed.  Max dose: 2 inj/day. This is a 30 day prescription. 08/12/19   Suzzanne Cloud, NP  topiramate (TOPAMAX) 100 MG tablet Take 1 tablet (100 mg total) by mouth 2 (two) times daily. 08/12/19   Suzzanne Cloud, NP      Allergies    Diphenhydramine hcl, Orange fruit, and Latex    Review of Systems   Review of Systems  All other systems reviewed and are negative.   Physical Exam Updated Vital Signs BP (!) 159/99 (BP Location: Right Arm)   Pulse 87   Temp 98.4 F (36.9 C) (Oral)   Resp 16    Ht 5' (1.524 m)   Wt 83.9 kg   SpO2 100%   BMI 36.13 kg/m  Physical Exam Vitals and nursing note reviewed.  Constitutional:      General: She is not in acute distress.    Appearance: She is well-developed.  HENT:     Head: Normocephalic and atraumatic.     Right Ear: Tympanic membrane, ear canal and external ear normal.     Left Ear: Hearing and tympanic membrane normal.     Ears:   Eyes:     Conjunctiva/sclera: Conjunctivae normal.  Pulmonary:     Effort: No respiratory distress.     Breath sounds: No stridor.  Abdominal:     General: There is no distension.  Skin:    General: Skin is warm and dry.  Neurological:     Mental Status: She is alert and oriented to person, place, and time.     Cranial Nerves: No cranial nerve deficit.  Psychiatric:        Mood and Affect: Mood normal.     ED Results / Procedures / Treatments   Labs (all labs ordered are listed, but only abnormal results are displayed) Labs Reviewed - No data to display  EKG None  Radiology No results found.  Procedures Procedures    Medications Ordered in ED Medications  NEOMYCIN-POLYMYXIN-HYDROCORTISONE (CORTISPORIN) OTIC (EAR) solution 3 drop (has no administration in time range)    ED Course/ Medical Decision Making/ A&P                           Medical Decision Making Well-appearing adult female presents with bleeding from the left ear.  Concern for TM rupture versus infection versus abrasion/laceration.  Physical exam reassuring with no evidence for TM rupture, and with no hearing loss further reassurance.  There is a visible abrasion of soft tissue just anterior to the TM, discussed at length with the patient, medication started, patient discharged with ENT follow-up ongoing meds.  Risk Prescription drug management.   Final Clinical Impression(s) / ED Diagnoses Final diagnoses:  Laceration of left ear without foreign body, initial encounter    Rx / DC Orders ED Discharge Orders      None         Carmin Muskrat, MD 12/17/21 [phone]

## 2021-12-17 NOTE — Discharge Instructions (Signed)
As discussed, use the provided eardrops 3 times daily for the next 3 days and keep the ear clean and dry.  Return here for concerning changes, or follow-up with our ENT colleague if your symptoms persist.

## 2021-12-29 ENCOUNTER — Ambulatory Visit (INDEPENDENT_AMBULATORY_CARE_PROVIDER_SITE_OTHER): Payer: BC Managed Care – PPO | Admitting: Family Medicine

## 2021-12-29 ENCOUNTER — Encounter: Payer: Self-pay | Admitting: Family Medicine

## 2021-12-29 VITALS — BP 140/100 | HR 93 | Ht 60.0 in | Wt 189.0 lb

## 2021-12-29 DIAGNOSIS — M546 Pain in thoracic spine: Secondary | ICD-10-CM

## 2021-12-29 DIAGNOSIS — E669 Obesity, unspecified: Secondary | ICD-10-CM

## 2021-12-29 DIAGNOSIS — F331 Major depressive disorder, recurrent, moderate: Secondary | ICD-10-CM | POA: Insufficient documentation

## 2021-12-29 DIAGNOSIS — F321 Major depressive disorder, single episode, moderate: Secondary | ICD-10-CM | POA: Diagnosis not present

## 2021-12-29 DIAGNOSIS — E785 Hyperlipidemia, unspecified: Secondary | ICD-10-CM

## 2021-12-29 DIAGNOSIS — Z23 Encounter for immunization: Secondary | ICD-10-CM | POA: Diagnosis not present

## 2021-12-29 DIAGNOSIS — I10 Essential (primary) hypertension: Secondary | ICD-10-CM

## 2021-12-29 DIAGNOSIS — Z1231 Encounter for screening mammogram for malignant neoplasm of breast: Secondary | ICD-10-CM

## 2021-12-29 DIAGNOSIS — G894 Chronic pain syndrome: Secondary | ICD-10-CM

## 2021-12-29 DIAGNOSIS — Z0001 Encounter for general adult medical examination with abnormal findings: Secondary | ICD-10-CM | POA: Insufficient documentation

## 2021-12-29 DIAGNOSIS — G8929 Other chronic pain: Secondary | ICD-10-CM

## 2021-12-29 DIAGNOSIS — F411 Generalized anxiety disorder: Secondary | ICD-10-CM | POA: Insufficient documentation

## 2021-12-29 DIAGNOSIS — R7303 Prediabetes: Secondary | ICD-10-CM

## 2021-12-29 DIAGNOSIS — E559 Vitamin D deficiency, unspecified: Secondary | ICD-10-CM

## 2021-12-29 MED ORDER — SPIRONOLACTONE 50 MG PO TABS: 50.0000 mg | ORAL_TABLET | Freq: Every day | ORAL | 3 refills | Status: DC

## 2021-12-29 MED ORDER — ESCITALOPRAM OXALATE 10 MG PO TABS: 10.0000 mg | ORAL_TABLET | Freq: Every day | ORAL | 2 refills | Status: DC

## 2021-12-29 NOTE — Assessment & Plan Note (Signed)
Start daily lexapro, refer therap, not suicidal or homicidal

## 2021-12-29 NOTE — Patient Instructions (Addendum)
F/U in 2 months, call if you need me sooner   Flu vaccine today  Start spironolactone 50 mg dailty for blood pressure and continue amlodipine as before  Start lexapro 10 ng daily for depression and anxiety and I am referring you to therapist also  It is important that you exercise regularly at least 30 minutes 5 times a week. If you develop chest pain, have severe difficulty breathing, or feel very tired, stop exercising immediately and seek medical attention    I will refer you to pain clinic in [place], keep appt with dr Freddie Apley office that you already have scheduled  Please sched mammogram at checkout  Fasting lipid, cmp and eGFr and hBA1c 3 days before next appt  Thanks for choosing [facility], we consider it a privelige to serve you.

## 2021-12-29 NOTE — Progress Notes (Signed)
Rebecca Boyd     MRN: [id number]      DOB: [birth year]  HPI: Patient is in for annual physical exam.  Multiple other health concerns are addressed, hypertension, obesity, depression,anxiety, chronic pain maangement Immunization is reviewed , and  updated if needed.   PE: BP (!) 140/100   Pulse 93   Ht 5' (1.524 m)   Wt 189 lb (85.7 kg)   LMP 12/26/2021 (Exact Date)   SpO2 96%   BMI 36.91 kg/m    Pleasant  female, alert and oriented x 3, in no cardio-pulmonary distress. Afebrile. HEENT No facial trauma or asymetry. Sinuses non tender.  Extra occullar muscles intact.. External ears normal, . Neck: supple, no adenopathy,JVD or thyromegaly.No bruits.  Chest: Clear to ascultation bilaterally.No crackles or wheezes. Non tender to palpation  Cardiovascular system; Heart sounds normal,  S1 and  S2 ,no S3.  No murmur, or thrill.  Peripheral pulses normal.  Abdomen: Soft, non tender, no organomegaly or masses.  Bowel sounds normal. No guarding, tenderness or rebound.   Musculoskeletal exam: Adequate ROM of spine, hips , shoulders and knees. No deformity ,swelling or crepitus noted. No muscle wasting or atrophy.   Neurologic: Cranial nerves 2 to 12 intact. Power, tone ,sensation normal throughout. No disturbance in gait. No tremor.  Skin: Intact, no ulceration, erythema , scaling or rash noted. Hyper Pigmented macular lesions on upper extremities Psych; Normal mood and affect. Judgement and concentration normal Positive depression and anxiety screen, not suicidal or homicidal   Assessment & Plan:  Annual visit for general adult medical examination with abnormal findings Annual exam as documented. Counseling done  re healthy lifestyle involving commitment to 150 minutes exercise per week, heart healthy diet, and attaining healthy weight.The importance of adequate sleep also discussed. Regular seat belt use and home safety, is also discussed. Changes in  health habits are decided on by the patient with goals and time frames  set for achieving them. Immunization and cancer screening needs are specifically addressed at this visit.   Essential hypertension Uncontrolled, increase spironolactone to 50 mg daily DASH diet and commitment to daily physical activity for a minimum of 30 minutes discussed and encouraged, as a part of hypertension management. The importance of attaining a healthy weight is also discussed.     12/29/2021    9:33 AM 12/29/2021    8:43 AM 12/29/2021    8:36 AM 12/17/2021    7:17 AM 12/17/2021    7:14 AM 12/08/2021   11:22 AM 11/03/2021   10:31 AM  BP/Weight  Systolic BP 182 993 716 967  893 810  Diastolic BP 175 102 94 99  101 91  Wt. (Lbs)   189  185  186  BMI   36.91 kg/m2  36.13 kg/m2  36.33 kg/m2       Obesity (BMI 30.0-34.9) Worsening  Patient re-educated about  the importance of commitment to a  minimum of 150 minutes of exercise per week as able.  The importance of healthy food choices with portion control discussed, as well as eating regularly and within a 12 hour window most days. The need to choose "clean , green" food 50 to 75% of the time is discussed, as well as to make water the primary drink and set a goal of 64 ounces water daily.       12/29/2021    8:36 AM 12/17/2021    7:14 AM 11/03/2021   10:31 AM  Weight /BMI  Weight  189 lb 185 lb 186 lb  Height 5' (1.524 m) 5' (1.524 m) 5' (1.524 m)  BMI 36.91 kg/m2 36.13 kg/m2 36.33 kg/m2      Depression, major, single episode, moderate (HCC) Start daily lexapro, refer therap, not suicidal or homicidal  GAD (generalized anxiety disorder) Start lexapro 10 mg daily and refer to therapy  Chronic thoracic spine pain Needs new  Pain clinic as current Provider is retiring

## 2021-12-29 NOTE — Assessment & Plan Note (Signed)
Needs new  Pain clinic as current Provider is retiring

## 2021-12-29 NOTE — Assessment & Plan Note (Signed)

## 2021-12-29 NOTE — Assessment & Plan Note (Signed)
Uncontrolled, increase spironolactone to 50 mg daily DASH diet and commitment to daily physical activity for a minimum of 30 minutes discussed and encouraged, as a part of hypertension management. The importance of attaining a healthy weight is also discussed.     12/29/2021    9:33 AM 12/29/2021    8:43 AM 12/29/2021    8:36 AM 12/17/2021    7:17 AM 12/17/2021    7:14 AM 12/08/2021   11:22 AM 11/03/2021   10:31 AM  BP/Weight  Systolic BP 024 097 353 299  242 683  Diastolic BP 419 622 94 99  101 91  Wt. (Lbs)   189  185  186  BMI   36.91 kg/m2  36.13 kg/m2  36.33 kg/m2

## 2021-12-29 NOTE — Assessment & Plan Note (Signed)
Start lexapro 10 mg daily and refer to therapy

## 2021-12-29 NOTE — Assessment & Plan Note (Signed)
Worsening  Patient re-educated about  the importance of commitment to a  minimum of 150 minutes of exercise per week as able.  The importance of healthy food choices with portion control discussed, as well as eating regularly and within a 12 hour window most days. The need to choose "clean , green" food 50 to 75% of the time is discussed, as well as to make water the primary drink and set a goal of 64 ounces water daily.       12/29/2021    8:36 AM 12/17/2021    7:14 AM 11/03/2021   10:31 AM  Weight /BMI  Weight 189 lb 185 lb 186 lb  Height 5' (1.524 m) 5' (1.524 m) 5' (1.524 m)  BMI 36.91 kg/m2 36.13 kg/m2 36.33 kg/m2

## 2022-01-06 ENCOUNTER — Telehealth: Payer: Self-pay | Admitting: Family Medicine

## 2022-01-06 ENCOUNTER — Other Ambulatory Visit: Payer: Self-pay

## 2022-01-06 MED ORDER — BETAMETHASONE DIPROPIONATE 0.05 % EX CREA: TOPICAL_CREAM | Freq: Two times a day (BID) | CUTANEOUS | 0 refills | Status: DC

## 2022-01-06 NOTE — Telephone Encounter (Signed)
Patient needs refill on   betamethasone dipropionate 0.05 % cream

## 2022-01-06 NOTE — Telephone Encounter (Signed)
Refilled

## 2022-01-10 ENCOUNTER — Ambulatory Visit: Payer: BC Managed Care – PPO | Admitting: Family Medicine

## 2022-01-24 ENCOUNTER — Telehealth: Payer: Self-pay | Admitting: Family Medicine

## 2022-01-24 NOTE — Telephone Encounter (Signed)
Ppt called stating she needs refill can you please refill?     Metronidazole 70gm (do not see this in her chart)   [employer]

## 2022-01-25 ENCOUNTER — Other Ambulatory Visit: Payer: Self-pay

## 2022-01-25 MED ORDER — METRONIDAZOLE 0.75 % VA GEL: 1.0000 | Freq: Two times a day (BID) | VAGINAL | 0 refills | Status: DC

## 2022-01-25 NOTE — Telephone Encounter (Signed)
Medication sent.

## 2022-01-26 ENCOUNTER — Encounter: Payer: Self-pay | Admitting: Orthopedic Surgery

## 2022-01-26 ENCOUNTER — Ambulatory Visit (INDEPENDENT_AMBULATORY_CARE_PROVIDER_SITE_OTHER): Payer: BC Managed Care – PPO | Admitting: Orthopedic Surgery

## 2022-01-26 ENCOUNTER — Telehealth: Payer: Self-pay | Admitting: Radiology

## 2022-01-26 DIAGNOSIS — M5116 Intervertebral disc disorders with radiculopathy, lumbar region: Secondary | ICD-10-CM

## 2022-01-26 MED ORDER — CYCLOBENZAPRINE HCL 5 MG PO TABS: 5.0000 mg | ORAL_TABLET | Freq: Three times a day (TID) | ORAL | 2 refills | Status: DC | PRN

## 2022-01-26 NOTE — Patient Instructions (Signed)
[facility] [phone] call for injection

## 2022-01-26 NOTE — Progress Notes (Signed)
Chief Complaint  Patient presents with   Back Pain    Injection 12/08/21 helped     Encounter Diagnosis  Name Primary?   Lumbar disc herniation with radiculopathy Yes    Rebecca Boyd comes in for her MRI.  I saw it and I saw the report.  Says she has facet arthritis at L4-5 and L5-S1 no protruding disc  Still has pain going down left leg  Recommend she continue Tylenol 500 mg every 6 prednisone a muscle relaxer and gabapentin  Recommend epidural steroid injections   The patient has received 1 epidural injection with good relief.  She says she got 60% relief of her radicular pain.  She is not at goal of 85% pain relief and off all opioid medication  She would like to have another injection  She will continue with Flexeril and gabapentin she is also on oxycodone  Meds ordered this encounter  Medications   cyclobenzaprine (FLEXERIL) 5 MG tablet    Sig: Take 1 tablet (5 mg total) by mouth 3 (three) times daily as needed for muscle spasms.    Dispense:  90 tablet    Refill:  2

## 2022-01-26 NOTE — Telephone Encounter (Signed)
Dr Aline Brochure wants FMLA updated for intermittent problem asked to send message to you

## 2022-01-31 NOTE — Telephone Encounter (Signed)
I called patient, she has received email from employer, will complete her part and bring the form by the office for [place] to complete.

## 2022-02-01 ENCOUNTER — Other Ambulatory Visit: Payer: Self-pay | Admitting: Orthopedic Surgery

## 2022-02-01 DIAGNOSIS — M5416 Radiculopathy, lumbar region: Secondary | ICD-10-CM

## 2022-02-10 ENCOUNTER — Other Ambulatory Visit: Payer: Self-pay | Admitting: Orthopedic Surgery

## 2022-02-10 ENCOUNTER — Other Ambulatory Visit: Payer: Self-pay

## 2022-02-10 ENCOUNTER — Ambulatory Visit
Admission: RE | Admit: 2022-02-10 | Discharge: 2022-02-10 | Disposition: A | Discharge status: Home / Self Care | Payer: BC Managed Care – PPO | Source: Ambulatory Visit | Attending: Orthopedic Surgery | Admitting: Orthopedic Surgery

## 2022-02-10 ENCOUNTER — Telehealth: Payer: Self-pay | Admitting: Family Medicine

## 2022-02-10 DIAGNOSIS — M5416 Radiculopathy, lumbar region: Secondary | ICD-10-CM

## 2022-02-10 DIAGNOSIS — Z0289 Encounter for other administrative examinations: Secondary | ICD-10-CM

## 2022-02-10 DIAGNOSIS — M4727 Other spondylosis with radiculopathy, lumbosacral region: Secondary | ICD-10-CM | POA: Diagnosis not present

## 2022-02-10 MED ORDER — IOPAMIDOL (ISOVUE-M 200) INJECTION 41%
1.0000 mL | Freq: Once | INTRAMUSCULAR | Status: AC
  Administered 2022-02-10: 1 mL via EPIDURAL

## 2022-02-10 MED ORDER — METHYLPREDNISOLONE ACETATE 40 MG/ML INJ SUSP (RADIOLOG
80.0000 mg | Freq: Once | INTRAMUSCULAR | Status: AC
  Administered 2022-02-10: 80 mg via EPIDURAL

## 2022-02-10 MED ORDER — AMLODIPINE BESYLATE 10 MG PO TABS: 10.0000 mg | ORAL_TABLET | Freq: Every day | ORAL | 1 refills | Status: DC

## 2022-02-10 NOTE — Telephone Encounter (Signed)
Med refilled.

## 2022-02-10 NOTE — Discharge Instructions (Signed)

## 2022-02-10 NOTE — Telephone Encounter (Signed)
Pt called stating she is needing a refill on amLODipine (NORVASC) 10 MG tablet . She is completely out. Can you please refill?     [employer]

## 2022-02-17 ENCOUNTER — Ambulatory Visit (HOSPITAL_COMMUNITY)
Admission: RE | Admit: 2022-02-17 | Discharge: 2022-02-17 | Disposition: A | Discharge status: Home / Self Care | Payer: BC Managed Care – PPO | Source: Ambulatory Visit | Attending: Family Medicine | Admitting: Family Medicine

## 2022-02-17 DIAGNOSIS — Z1231 Encounter for screening mammogram for malignant neoplasm of breast: Secondary | ICD-10-CM | POA: Insufficient documentation

## 2022-02-22 DIAGNOSIS — M545 Low back pain, unspecified: Secondary | ICD-10-CM | POA: Diagnosis not present

## 2022-02-22 DIAGNOSIS — M546 Pain in thoracic spine: Secondary | ICD-10-CM | POA: Diagnosis not present

## 2022-02-22 DIAGNOSIS — I10 Essential (primary) hypertension: Secondary | ICD-10-CM | POA: Diagnosis not present

## 2022-02-22 DIAGNOSIS — M542 Cervicalgia: Secondary | ICD-10-CM | POA: Diagnosis not present

## 2022-03-02 ENCOUNTER — Encounter: Payer: Self-pay | Admitting: Family Medicine

## 2022-03-02 ENCOUNTER — Ambulatory Visit (INDEPENDENT_AMBULATORY_CARE_PROVIDER_SITE_OTHER): Payer: BC Managed Care – PPO | Admitting: Family Medicine

## 2022-03-02 VITALS — BP 130/88 | HR 87 | Ht 60.0 in | Wt 187.0 lb

## 2022-03-02 DIAGNOSIS — I10 Essential (primary) hypertension: Secondary | ICD-10-CM

## 2022-03-02 DIAGNOSIS — E669 Obesity, unspecified: Secondary | ICD-10-CM

## 2022-03-02 DIAGNOSIS — F411 Generalized anxiety disorder: Secondary | ICD-10-CM | POA: Diagnosis not present

## 2022-03-02 MED ORDER — BUSPIRONE HCL 5 MG PO TABS: 5.0000 mg | ORAL_TABLET | Freq: Three times a day (TID) | ORAL | 2 refills | Status: DC

## 2022-03-02 NOTE — Assessment & Plan Note (Signed)
Uncontrolled Start buspar and re eval in 10 weeks, also encouraged to resume Psych  management

## 2022-03-02 NOTE — Progress Notes (Signed)
   Rebecca Boyd     MRN: [id number]      DOB: [birth year]   HPI [name] is here for follow up and re-evaluation of chronic medical conditions, medication management and review of any available recent lab and radiology data.  Preventive health is updated, specifically  Cancer screening and Immunization.   Still has a lot of anxiety, life is always very strsssful, has not been in contact wih psych for months  ROS Denies recent fever or chills. Denies sinus pressure, nasal congestion, ear pain or sore throat. Denies chest congestion, productive cough or wheezing. Denies chest pains, palpitations and leg swelling Denies abdominal pain, nausea, vomiting,diarrhea or constipation.   Denies dysuria, frequency, hesitancy or incontinence. Denies joint pain, swelling and limitation in mobility. Denies headaches, seizures, numbness, or tingling.  Denies skin break down or rash.   PE  BP 130/88 (BP Location: Right Arm, Patient Position: Sitting, Cuff Size: Large)   Pulse 87   Ht 5' (1.524 m)   Wt 187 lb 0.6 oz (84.8 kg)   SpO2 98%   BMI 36.53 kg/m   Patient alert and oriented and in no cardiopulmonary distress.  HEENT: No facial asymmetry, EOMI,     Neck supple .  Chest: Clear to auscultation bilaterally.  CVS: S1, S2 no murmurs, no S3.Regular rate.  ABD: Soft non tender.   Ext: No edema  MS: Adequate ROM spine, shoulders, hips and knees.  Skin: Intact, no ulcerations or rash noted.  Psych: Good eye contact, normal affect. Memory intact not anxious or depressed appearing.  CNS: CN 2-12 intact, power,  normal throughout.no focal deficits noted.   Assessment & Plan  Essential hypertension Sub optimal Control, no change in medication DASH diet and commitment to daily physical activity for a minimum of 30 minutes discussed and encouraged, as a part of hypertension management. The importance of attaining a healthy weight is also discussed.     03/02/2022   11:30 AM  02/10/2022   12:53 PM 12/29/2021    9:33 AM 12/29/2021    8:43 AM 12/29/2021    8:36 AM 12/17/2021    7:17 AM 12/17/2021    7:14 AM  BP/Weight  Systolic BP 867 619 509 326 712 458   Diastolic BP 88 96 099 833 94 99   Wt. (Lbs) 187.04    189  185  BMI 36.53 kg/m2    36.91 kg/m2  36.13 kg/m2       Obesity (BMI 30.0-34.9)  Patient re-educated about  the importance of commitment to a  minimum of 150 minutes of exercise per week as able.  The importance of healthy food choices with portion control discussed, as well as eating regularly and within a 12 hour window most days. The need to choose "clean , green" food 50 to 75% of the time is discussed, as well as to make water the primary drink and set a goal of 64 ounces water daily.       03/02/2022   11:30 AM 12/29/2021    8:36 AM 12/17/2021    7:14 AM  Weight /BMI  Weight 187 lb 0.6 oz 189 lb 185 lb  Height 5' (1.524 m) 5' (1.524 m) 5' (1.524 m)  BMI 36.53 kg/m2 36.91 kg/m2 36.13 kg/m2      GAD (generalized anxiety disorder) Uncontrolled Start buspar and re eval in 10 weeks, also encouraged to resume Psych  management

## 2022-03-02 NOTE — Assessment & Plan Note (Addendum)
Sub optimal Control, no change in medication DASH diet and commitment to daily physical activity for a minimum of 30 minutes discussed and encouraged, as a part of hypertension management. The importance of attaining a healthy weight is also discussed.     03/02/2022   11:30 AM 02/10/2022   12:53 PM 12/29/2021    9:33 AM 12/29/2021    8:43 AM 12/29/2021    8:36 AM 12/17/2021    7:17 AM 12/17/2021    7:14 AM  BP/Weight  Systolic BP 582 518 984 210 312 811   Diastolic BP 88 96 886 773 94 99   Wt. (Lbs) 187.04    189  185  BMI 36.53 kg/m2    36.91 kg/m2  36.13 kg/m2

## 2022-03-02 NOTE — Addendum Note (Signed)
Addended by: Tula Nakayama E on: 03/02/2022 01:31 PM   Modules accepted: Orders

## 2022-03-02 NOTE — Assessment & Plan Note (Signed)
  Patient re-educated about  the importance of commitment to a  minimum of 150 minutes of exercise per week as able.  The importance of healthy food choices with portion control discussed, as well as eating regularly and within a 12 hour window most days. The need to choose "clean , green" food 50 to 75% of the time is discussed, as well as to make water the primary drink and set a goal of 64 ounces water daily.       03/02/2022   11:30 AM 12/29/2021    8:36 AM 12/17/2021    7:14 AM  Weight /BMI  Weight 187 lb 0.6 oz 189 lb 185 lb  Height 5' (1.524 m) 5' (1.524 m) 5' (1.524 m)  BMI 36.53 kg/m2 36.91 kg/m2 36.13 kg/m2

## 2022-03-02 NOTE — Patient Instructions (Signed)
F/u early January, call if you need me sooner  Buspar is new for anxiety  Pls contact Psychiatry for management of anxiety  Fasting lipid, cmp and EGFr today after 1, cancel HBA1C  It is important that you exercise regularly at least 30 minutes 5 times a week. If you develop chest pain, have severe difficulty breathing, or feel very tired, stop exercising immediately and seek medical attention   Continue to work on healthy food choice   Thanks for choosing [facility], we consider it a privelige to serve you.

## 2022-04-03 DIAGNOSIS — Z79891 Long term (current) use of opiate analgesic: Secondary | ICD-10-CM | POA: Diagnosis not present

## 2022-04-03 DIAGNOSIS — M546 Pain in thoracic spine: Secondary | ICD-10-CM | POA: Diagnosis not present

## 2022-04-03 DIAGNOSIS — M545 Low back pain, unspecified: Secondary | ICD-10-CM | POA: Diagnosis not present

## 2022-04-03 DIAGNOSIS — M542 Cervicalgia: Secondary | ICD-10-CM | POA: Diagnosis not present

## 2022-04-06 ENCOUNTER — Other Ambulatory Visit: Payer: Self-pay

## 2022-04-06 ENCOUNTER — Ambulatory Visit (INDEPENDENT_AMBULATORY_CARE_PROVIDER_SITE_OTHER): Payer: BC Managed Care – PPO | Admitting: Family Medicine

## 2022-04-06 ENCOUNTER — Encounter: Payer: Self-pay | Admitting: Family Medicine

## 2022-04-06 ENCOUNTER — Ambulatory Visit (INDEPENDENT_AMBULATORY_CARE_PROVIDER_SITE_OTHER): Payer: BC Managed Care – PPO | Admitting: Orthopedic Surgery

## 2022-04-06 VITALS — BP 129/83 | HR 87 | Ht 60.0 in

## 2022-04-06 VITALS — BP 124/82 | HR 85 | Ht 60.0 in | Wt 186.0 lb

## 2022-04-06 DIAGNOSIS — E785 Hyperlipidemia, unspecified: Secondary | ICD-10-CM | POA: Diagnosis not present

## 2022-04-06 DIAGNOSIS — H6592 Unspecified nonsuppurative otitis media, left ear: Secondary | ICD-10-CM | POA: Insufficient documentation

## 2022-04-06 DIAGNOSIS — R7303 Prediabetes: Secondary | ICD-10-CM | POA: Diagnosis not present

## 2022-04-06 DIAGNOSIS — R109 Unspecified abdominal pain: Secondary | ICD-10-CM

## 2022-04-06 DIAGNOSIS — R350 Frequency of micturition: Secondary | ICD-10-CM

## 2022-04-06 DIAGNOSIS — I10 Essential (primary) hypertension: Secondary | ICD-10-CM

## 2022-04-06 DIAGNOSIS — H66002 Acute suppurative otitis media without spontaneous rupture of ear drum, left ear: Secondary | ICD-10-CM

## 2022-04-06 DIAGNOSIS — H6692 Otitis media, unspecified, left ear: Secondary | ICD-10-CM | POA: Insufficient documentation

## 2022-04-06 DIAGNOSIS — E669 Obesity, unspecified: Secondary | ICD-10-CM

## 2022-04-06 DIAGNOSIS — M47816 Spondylosis without myelopathy or radiculopathy, lumbar region: Secondary | ICD-10-CM | POA: Diagnosis not present

## 2022-04-06 LAB — POCT URINALYSIS DIP (CLINITEK)
Bilirubin, UA: NEGATIVE
Glucose, UA: NEGATIVE mg/dL
Ketones, POC UA: NEGATIVE mg/dL
Leukocytes, UA: NEGATIVE
Nitrite, UA: NEGATIVE
POC PROTEIN,UA: NEGATIVE
Spec Grav, UA: 1.02 (ref 1.010–1.025)
Urobilinogen, UA: 0.2 E.U./dL
pH, UA: 6 (ref 5.0–8.0)

## 2022-04-06 MED ORDER — AZITHROMYCIN 250 MG PO TABS: ORAL_TABLET | ORAL | 0 refills | Status: AC

## 2022-04-06 NOTE — Patient Instructions (Signed)
F/U as before, call if you need me sooner  cCUA today and culture if abnormal  Labs already ordered today  You are referred for [place] of your kidneys, we will call with appointment information.  Blood pressure is excellent, continue current medication.  Please continue to work on weight loss through changing how you eat and increasing your exercise commitment.  Please do get the current COVID-vaccine.  Antibiotic is prescribed for left ear infection please take as directed. Thanks for choosing [facility], we consider it a privelige to serve you.

## 2022-04-06 NOTE — Addendum Note (Signed)
Addended by: Elizabeth Sauer on: 04/06/2022 04:25 PM   Modules accepted: Orders

## 2022-04-06 NOTE — Assessment & Plan Note (Signed)
Controlled, no change in medication DASH diet and commitment to daily physical activity for a minimum of 30 minutes discussed and encouraged, as a part of hypertension management. The importance of attaining a healthy weight is also discussed.     04/06/2022    8:27 AM 04/06/2022    8:08 AM 03/02/2022   11:30 AM 02/10/2022   12:53 PM 12/29/2021    9:33 AM 12/29/2021    8:43 AM 12/29/2021    8:36 AM  BP/Weight  Systolic BP 175 102 585 277 824 235 361  Diastolic BP 82 87 88 96 443 102 94  Wt. (Lbs)  186.04 187.04    189  BMI  36.33 kg/m2 36.53 kg/m2    36.91 kg/m2

## 2022-04-06 NOTE — Assessment & Plan Note (Signed)
Z pack x 1

## 2022-04-06 NOTE — Assessment & Plan Note (Signed)
CCUA in office with reflex c/s, if negative Urology eval

## 2022-04-06 NOTE — Assessment & Plan Note (Signed)
2 week history, obtain US

## 2022-04-06 NOTE — Progress Notes (Signed)
Chief Complaint  Patient presents with   Follow-up    Recheck on back pain    46 year old female had 2 epidural injections a seem to work and then wear off she was out of work this week for 7 days  I think that she should see neurosurgery and get the third epidural  We will arrange the neurosurgical referral    L1-2: Negative   L2-3: Negative   L3-4: Negative   L4-5: Mild facet degeneration. Negative for disc protrusion or stenosis.   L5-S1: Mild facet degeneration.  No disc protrusion or stenosis.   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     Electronically Signed   By: Franchot Gallo M.D.   On: 11/24/2021 13:59

## 2022-04-07 ENCOUNTER — Encounter: Payer: Self-pay | Admitting: Orthopedic Surgery

## 2022-04-07 ENCOUNTER — Other Ambulatory Visit: Payer: Self-pay | Admitting: Orthopedic Surgery

## 2022-04-07 DIAGNOSIS — M47816 Spondylosis without myelopathy or radiculopathy, lumbar region: Secondary | ICD-10-CM

## 2022-04-07 LAB — CMP14+EGFR
ALT: 22 IU/L (ref 0–32)
AST: 16 IU/L (ref 0–40)
Albumin/Globulin Ratio: 1.8 (ref 1.2–2.2)
Albumin: 4.5 g/dL (ref 3.9–4.9)
Alkaline Phosphatase: 102 IU/L (ref 44–121)
BUN/Creatinine Ratio: 9 (ref 9–23)
BUN: 7 mg/dL (ref 6–24)
Bilirubin Total: 0.3 mg/dL (ref 0.0–1.2)
CO2: 24 mmol/L (ref 20–29)
Calcium: 9.5 mg/dL (ref 8.7–10.2)
Chloride: 103 mmol/L (ref 96–106)
Creatinine, Ser: 0.75 mg/dL (ref 0.57–1.00)
Globulin, Total: 2.5 g/dL (ref 1.5–4.5)
Glucose: 103 mg/dL — ABNORMAL HIGH (ref 70–99)
Potassium: 3.6 mmol/L (ref 3.5–5.2)
Sodium: 142 mmol/L (ref 134–144)
Total Protein: 7 g/dL (ref 6.0–8.5)
eGFR: 99 mL/min/{1.73_m2} (ref >59)

## 2022-04-07 LAB — LIPID PANEL
Chol/HDL Ratio: 4.3 ratio (ref 0.0–4.4)
Cholesterol, Total: 197 mg/dL (ref 100–199)
HDL: 46 mg/dL (ref >39)
LDL Chol Calc (NIH): 127 mg/dL — ABNORMAL HIGH (ref 0–99)
Triglycerides: 134 mg/dL (ref 0–149)
VLDL Cholesterol Cal: 24 mg/dL (ref 5–40)

## 2022-04-08 LAB — URINE CULTURE: Organism ID, Bacteria: NO GROWTH

## 2022-04-09 ENCOUNTER — Encounter: Payer: Self-pay | Admitting: Family Medicine

## 2022-04-09 NOTE — Progress Notes (Signed)
Rebecca Boyd     MRN: [id number]      DOB: 21-Apr-1976   HPI Rebecca Boyd is here for follow up and re-evaluation of chronic medical conditions, medication management and review of any available recent lab and radiology data.  Preventive health is updated, specifically  Cancer screening and Immunization.   Questions or concerns regarding consultations or procedures which the PT has had in the interim are  addressed. The PT denies any adverse reactions to current medications since the last visit.  C/o bilateral flank pain and urinary frequency  C/o left ear pain  ROS Denies recent fever or chills. Denies sinus pressure, nasal congestion, c/o left ear pain denies sore throat. Denies chest congestion, productive cough or wheezing. Denies chest pains, palpitations and leg swelling Denies abdominal pain, nausea, vomiting,diarrhea or constipation.   Denies dysuria,  Denies joint pain, swelling and limitation in mobility. Denies headaches, seizures, numbness, or tingling. C/o  depression, anxiety and insomnia.Not suicidal or homicidal Denies skin break down or rash.   PE  BP 124/82   Pulse 85   Ht 5' (1.524 m)   Wt 186 lb 0.6 oz (84.4 kg)   SpO2 98%   BMI 36.33 kg/m   Patient alert and oriented and in no cardiopulmonary distress.  HEENT: No facial asymmetry, EOMI,     Neck supple .Left TM mildly erythematous with reduced light reflex, , right TM normal  Chest: Clear to auscultation bilaterally.  CVS: S1, S2 no murmurs, no S3.Regular rate.  ABD: Soft bilateral flank tenderness.   Ext: No edema  MS: Adequate ROM spine, shoulders, hips and knees.  Skin: Intact, no ulcerations or rash noted.  Psych: Good eye contact, normal affect. Memory intact not anxious or depressed appearing.  CNS: CN 2-12 intact, power,  normal throughout.no focal deficits noted.   Assessment & Plan  Essential hypertension Controlled, no change in medication DASH diet and commitment to daily  physical activity for a minimum of 30 minutes discussed and encouraged, as a part of hypertension management. The importance of attaining a healthy weight is also discussed.     04/06/2022    8:27 AM 04/06/2022    8:08 AM 03/02/2022   11:30 AM 02/10/2022   12:53 PM 12/29/2021    9:33 AM 12/29/2021    8:43 AM 12/29/2021    8:36 AM  BP/Weight  Systolic BP 211 941 740 814 481 856 314  Diastolic BP 82 87 88 96 970 102 94  Wt. (Lbs)  186.04 187.04    189  BMI  36.33 kg/m2 36.53 kg/m2    36.91 kg/m2       Urinary frequency CCUA in office with reflex c/s, if negative Urology eval  Flank pain 2 week history, obtain US  LOM (left otitis media) Z pack x 1  Obesity (BMI 30.0-34.9)  Patient re-educated about  the importance of commitment to a  minimum of 150 minutes of exercise per week as able.  The importance of healthy food choices with portion control discussed, as well as eating regularly and within a 12 hour window most days. The need to choose "clean , green" food 50 to 75% of the time is discussed, as well as to make water the primary drink and set a goal of 64 ounces water daily.       04/06/2022    3:52 PM 04/06/2022    8:08 AM 03/02/2022   11:30 AM  Weight /BMI  Weight  186 lb 0.6  oz 187 lb 0.6 oz  Height 5' (1.524 m) 5' (1.524 m) 5' (1.524 m)  BMI 36.33 kg/m2 36.33 kg/m2 36.53 kg/m2

## 2022-04-09 NOTE — Assessment & Plan Note (Signed)
  Patient re-educated about  the importance of commitment to a  minimum of 150 minutes of exercise per week as able.  The importance of healthy food choices with portion control discussed, as well as eating regularly and within a 12 hour window most days. The need to choose "clean , green" food 50 to 75% of the time is discussed, as well as to make water the primary drink and set a goal of 64 ounces water daily.       04/06/2022    3:52 PM 04/06/2022    8:08 AM 03/02/2022   11:30 AM  Weight /BMI  Weight  186 lb 0.6 oz 187 lb 0.6 oz  Height 5' (1.524 m) 5' (1.524 m) 5' (1.524 m)  BMI 36.33 kg/m2 36.33 kg/m2 36.53 kg/m2

## 2022-04-10 ENCOUNTER — Ambulatory Visit (HOSPITAL_COMMUNITY)
Admission: RE | Admit: 2022-04-10 | Discharge: 2022-04-10 | Disposition: A | Discharge status: Home / Self Care | Payer: BC Managed Care – PPO | Source: Ambulatory Visit | Attending: Family Medicine | Admitting: Family Medicine

## 2022-04-10 DIAGNOSIS — N281 Cyst of kidney, acquired: Secondary | ICD-10-CM | POA: Diagnosis not present

## 2022-04-10 DIAGNOSIS — I10 Essential (primary) hypertension: Secondary | ICD-10-CM | POA: Insufficient documentation

## 2022-04-10 DIAGNOSIS — R109 Unspecified abdominal pain: Secondary | ICD-10-CM | POA: Diagnosis not present

## 2022-04-11 ENCOUNTER — Ambulatory Visit
Admission: RE | Admit: 2022-04-11 | Discharge: 2022-04-11 | Disposition: A | Discharge status: Home / Self Care | Payer: BC Managed Care – PPO | Source: Ambulatory Visit | Attending: Orthopedic Surgery | Admitting: Orthopedic Surgery

## 2022-04-11 DIAGNOSIS — M4727 Other spondylosis with radiculopathy, lumbosacral region: Secondary | ICD-10-CM | POA: Diagnosis not present

## 2022-04-11 DIAGNOSIS — M47816 Spondylosis without myelopathy or radiculopathy, lumbar region: Secondary | ICD-10-CM

## 2022-04-11 MED ORDER — IOPAMIDOL (ISOVUE-M 200) INJECTION 41%
1.0000 mL | Freq: Once | INTRAMUSCULAR | Status: AC
  Administered 2022-04-11: 1 mL via EPIDURAL

## 2022-04-11 MED ORDER — METHYLPREDNISOLONE ACETATE 40 MG/ML INJ SUSP (RADIOLOG
80.0000 mg | Freq: Once | INTRAMUSCULAR | Status: AC
  Administered 2022-04-11: 80 mg via EPIDURAL

## 2022-04-11 NOTE — Discharge Instructions (Signed)

## 2022-04-12 ENCOUNTER — Telehealth: Payer: Self-pay

## 2022-04-12 ENCOUNTER — Encounter: Payer: Self-pay | Admitting: Orthopedic Surgery

## 2022-04-12 NOTE — Telephone Encounter (Signed)
Patient signed a release for medical records and stopped in to see if they had been done. I think it was put in your box, but anyway she wants them sent to her insurance, because they told her that they hadn't received anything from [place]. I don't if she had already did paperwork about it, but she said if you had questions to call her.

## 2022-04-14 NOTE — Telephone Encounter (Signed)
Spoke with patient, advised will send.  Now sent.

## 2022-04-25 ENCOUNTER — Ambulatory Visit (HOSPITAL_COMMUNITY): Payer: BC Managed Care – PPO | Admitting: Psychiatry

## 2022-04-26 ENCOUNTER — Encounter: Payer: Self-pay | Admitting: Family Medicine

## 2022-04-26 MED ORDER — PROMETHAZINE-DM 6.25-15 MG/5ML PO SYRP: ORAL_SOLUTION | ORAL | 0 refills | Status: DC

## 2022-04-27 ENCOUNTER — Observation Stay (HOSPITAL_COMMUNITY)
Admission: EM | Admit: 2022-04-27 | Discharge: 2022-04-28 | Disposition: A | Discharge status: Home / Self Care | Payer: BC Managed Care – PPO | Attending: Family Medicine | Admitting: Family Medicine

## 2022-04-27 ENCOUNTER — Encounter (HOSPITAL_COMMUNITY): Payer: Self-pay

## 2022-04-27 ENCOUNTER — Other Ambulatory Visit: Payer: Self-pay

## 2022-04-27 ENCOUNTER — Emergency Department (HOSPITAL_COMMUNITY): Payer: BC Managed Care – PPO

## 2022-04-27 DIAGNOSIS — Z1152 Encounter for screening for COVID-19: Secondary | ICD-10-CM | POA: Insufficient documentation

## 2022-04-27 DIAGNOSIS — E876 Hypokalemia: Secondary | ICD-10-CM | POA: Diagnosis not present

## 2022-04-27 DIAGNOSIS — I10 Essential (primary) hypertension: Secondary | ICD-10-CM | POA: Diagnosis not present

## 2022-04-27 DIAGNOSIS — Z91040 Latex allergy status: Secondary | ICD-10-CM | POA: Diagnosis not present

## 2022-04-27 DIAGNOSIS — J189 Pneumonia, unspecified organism: Secondary | ICD-10-CM | POA: Insufficient documentation

## 2022-04-27 DIAGNOSIS — J45901 Unspecified asthma with (acute) exacerbation: Principal | ICD-10-CM | POA: Diagnosis present

## 2022-04-27 DIAGNOSIS — R55 Syncope and collapse: Secondary | ICD-10-CM | POA: Diagnosis present

## 2022-04-27 DIAGNOSIS — R0602 Shortness of breath: Secondary | ICD-10-CM | POA: Diagnosis not present

## 2022-04-27 DIAGNOSIS — F321 Major depressive disorder, single episode, moderate: Secondary | ICD-10-CM | POA: Diagnosis present

## 2022-04-27 DIAGNOSIS — F331 Major depressive disorder, recurrent, moderate: Secondary | ICD-10-CM | POA: Diagnosis present

## 2022-04-27 DIAGNOSIS — G43909 Migraine, unspecified, not intractable, without status migrainosus: Secondary | ICD-10-CM | POA: Diagnosis present

## 2022-04-27 DIAGNOSIS — Z79899 Other long term (current) drug therapy: Secondary | ICD-10-CM | POA: Insufficient documentation

## 2022-04-27 DIAGNOSIS — R7303 Prediabetes: Secondary | ICD-10-CM | POA: Diagnosis not present

## 2022-04-27 DIAGNOSIS — Z87891 Personal history of nicotine dependence: Secondary | ICD-10-CM | POA: Insufficient documentation

## 2022-04-27 DIAGNOSIS — J168 Pneumonia due to other specified infectious organisms: Secondary | ICD-10-CM | POA: Diagnosis not present

## 2022-04-27 LAB — BASIC METABOLIC PANEL
Anion gap: 4 — ABNORMAL LOW (ref 5–15)
BUN: 9 mg/dL (ref 6–20)
CO2: 27 mmol/L (ref 22–32)
Calcium: 9.3 mg/dL (ref 8.9–10.3)
Chloride: 106 mmol/L (ref 98–111)
Creatinine, Ser: 0.8 mg/dL (ref 0.44–1.00)
GFR, Estimated: >60 mL/min (ref >60)
Glucose, Bld: 127 mg/dL — ABNORMAL HIGH (ref 70–99)
Potassium: 3.1 mmol/L — ABNORMAL LOW (ref 3.5–5.1)
Sodium: 137 mmol/L (ref 135–145)

## 2022-04-27 LAB — CBC WITH DIFFERENTIAL/PLATELET
Abs Immature Granulocytes: 0.04 10*3/uL (ref 0.00–0.07)
Basophils Absolute: 0 10*3/uL (ref 0.0–0.1)
Basophils Relative: 0 %
Eosinophils Absolute: 0.1 10*3/uL (ref 0.0–0.5)
Eosinophils Relative: 1 %
HCT: 41.1 % (ref 36.0–46.0)
Hemoglobin: 14.2 g/dL (ref 12.0–15.0)
Immature Granulocytes: 0 %
Lymphocytes Relative: 14 %
Lymphs Abs: 2 10*3/uL (ref 0.7–4.0)
MCH: 30.3 pg (ref 26.0–34.0)
MCHC: 34.5 g/dL (ref 30.0–36.0)
MCV: 87.6 fL (ref 80.0–100.0)
Monocytes Absolute: 0.6 10*3/uL (ref 0.1–1.0)
Monocytes Relative: 5 %
Neutro Abs: 11 10*3/uL — ABNORMAL HIGH (ref 1.7–7.7)
Neutrophils Relative %: 80 %
Platelets: 232 10*3/uL (ref 150–400)
RBC: 4.69 MIL/uL (ref 3.87–5.11)
RDW: 13.2 % (ref 11.5–15.5)
WBC: 13.8 10*3/uL — ABNORMAL HIGH (ref 4.0–10.5)
nRBC: 0 % (ref 0.0–0.2)

## 2022-04-27 LAB — RESP PANEL BY RT-PCR (RSV, FLU A&B, COVID)  RVPGX2
Influenza A by PCR: NEGATIVE
Influenza B by PCR: NEGATIVE
Resp Syncytial Virus by PCR: NEGATIVE
SARS Coronavirus 2 by RT PCR: NEGATIVE

## 2022-04-27 LAB — PROCALCITONIN: Procalcitonin: <0.1 ng/mL

## 2022-04-27 LAB — BLOOD GAS, ARTERIAL
Acid-base deficit: 4.1 mmol/L — ABNORMAL HIGH (ref 0.0–2.0)
Bicarbonate: 20 mmol/L (ref 20.0–28.0)
Drawn by: 22223
FIO2: 30 %
O2 Saturation: 98.8 %
Patient temperature: 37
pCO2 arterial: 33 mmHg (ref 32–48)
pH, Arterial: 7.39 (ref 7.35–7.45)
pO2, Arterial: 94 mmHg (ref 83–108)

## 2022-04-27 LAB — BLOOD GAS, VENOUS
Acid-Base Excess: 6 mmol/L — ABNORMAL HIGH (ref 0.0–2.0)
Bicarbonate: 30.6 mmol/L — ABNORMAL HIGH (ref 20.0–28.0)
Drawn by: 41012
O2 Saturation: 54.7 %
Patient temperature: 37
pCO2, Ven: 43 mmHg — ABNORMAL LOW (ref 44–60)
pH, Ven: 7.46 — ABNORMAL HIGH (ref 7.25–7.43)
pO2, Ven: 33 mmHg (ref 32–45)

## 2022-04-27 LAB — MAGNESIUM: Magnesium: 1.8 mg/dL (ref 1.7–2.4)

## 2022-04-27 LAB — TROPONIN I (HIGH SENSITIVITY): Troponin I (High Sensitivity): 4 ng/L (ref <18)

## 2022-04-27 MED ORDER — ALBUTEROL SULFATE (2.5 MG/3ML) 0.083% IN NEBU
2.5000 mg | INHALATION_SOLUTION | Freq: Three times a day (TID) | RESPIRATORY_TRACT | Status: DC
  Administered 2022-04-27: 2.5 mg via RESPIRATORY_TRACT

## 2022-04-27 MED ORDER — ALBUTEROL SULFATE (2.5 MG/3ML) 0.083% IN NEBU
2.5000 mg | INHALATION_SOLUTION | Freq: Three times a day (TID) | RESPIRATORY_TRACT | Status: DC
  Administered 2022-04-28: 2.5 mg via RESPIRATORY_TRACT
  Filled 2022-04-27: qty 3

## 2022-04-27 MED ORDER — BUSPIRONE HCL 5 MG PO TABS
5.0000 mg | ORAL_TABLET | Freq: Three times a day (TID) | ORAL | Status: DC
  Administered 2022-04-27 – 2022-04-28 (×2): 5 mg via ORAL
  Filled 2022-04-27 (×2): qty 1

## 2022-04-27 MED ORDER — POTASSIUM CHLORIDE IN NACL 40-0.9 MEQ/L-% IV SOLN
INTRAVENOUS | Status: DC
  Filled 2022-04-27 (×2): qty 1000

## 2022-04-27 MED ORDER — ALBUTEROL (5 MG/ML) CONTINUOUS INHALATION SOLN
10.0000 mg/h | INHALATION_SOLUTION | RESPIRATORY_TRACT | Status: AC
  Filled 2022-04-27: qty 20

## 2022-04-27 MED ORDER — FLUTICASONE PROPIONATE 50 MCG/ACT NA SUSP: 1.0000 | Freq: Every day | NASAL | Status: DC

## 2022-04-27 MED ORDER — MAGNESIUM SULFATE 2 GM/50ML IV SOLN
2.0000 g | Freq: Once | INTRAVENOUS | Status: AC
  Administered 2022-04-27: 2 g via INTRAVENOUS
  Filled 2022-04-27: qty 50

## 2022-04-27 MED ORDER — METHYLPREDNISOLONE SODIUM SUCC 125 MG IJ SOLR
125.0000 mg | Freq: Once | INTRAMUSCULAR | Status: AC
  Administered 2022-04-27: 125 mg via INTRAVENOUS
  Filled 2022-04-27: qty 2

## 2022-04-27 MED ORDER — ONDANSETRON HCL 4 MG PO TABS: 4.0000 mg | ORAL_TABLET | Freq: Four times a day (QID) | ORAL | Status: DC | PRN

## 2022-04-27 MED ORDER — MONTELUKAST SODIUM 10 MG PO TABS
10.0000 mg | ORAL_TABLET | Freq: Every day | ORAL | Status: DC
  Administered 2022-04-27: 10 mg via ORAL
  Filled 2022-04-27: qty 1

## 2022-04-27 MED ORDER — POTASSIUM CHLORIDE 10 MEQ/100ML IV SOLN
10.0000 meq | Freq: Once | INTRAVENOUS | Status: AC
  Administered 2022-04-27: 10 meq via INTRAVENOUS
  Filled 2022-04-27: qty 100

## 2022-04-27 MED ORDER — ACETAMINOPHEN 325 MG PO TABS
650.0000 mg | ORAL_TABLET | Freq: Four times a day (QID) | ORAL | Status: DC | PRN
  Administered 2022-04-28: 650 mg via ORAL
  Filled 2022-04-27: qty 2

## 2022-04-27 MED ORDER — AMLODIPINE BESYLATE 5 MG PO TABS
10.0000 mg | ORAL_TABLET | Freq: Every day | ORAL | Status: DC
  Administered 2022-04-28: 10 mg via ORAL
  Filled 2022-04-27: qty 2

## 2022-04-27 MED ORDER — SODIUM CHLORIDE 0.9 % IV SOLN
100.0000 mg | Freq: Two times a day (BID) | INTRAVENOUS | Status: DC
  Administered 2022-04-28: 100 mg via INTRAVENOUS
  Filled 2022-04-27 (×4): qty 100

## 2022-04-27 MED ORDER — ALBUTEROL SULFATE (2.5 MG/3ML) 0.083% IN NEBU
INHALATION_SOLUTION | RESPIRATORY_TRACT | Status: AC
  Administered 2022-04-27: 10 mg via RESPIRATORY_TRACT
  Filled 2022-04-27: qty 12

## 2022-04-27 MED ORDER — ALBUTEROL SULFATE (2.5 MG/3ML) 0.083% IN NEBU
2.5000 mg | INHALATION_SOLUTION | RESPIRATORY_TRACT | Status: DC | PRN
  Filled 2022-04-27: qty 3

## 2022-04-27 MED ORDER — POTASSIUM CHLORIDE IN NACL 40-0.9 MEQ/L-% IV SOLN
INTRAVENOUS | Status: DC
  Filled 2022-04-27 (×3): qty 1000

## 2022-04-27 MED ORDER — SODIUM CHLORIDE 0.9 % IV SOLN
1.0000 g | Freq: Once | INTRAVENOUS | Status: AC
  Administered 2022-04-27: 1 g via INTRAVENOUS
  Filled 2022-04-27: qty 10

## 2022-04-27 MED ORDER — ACETAMINOPHEN 500 MG PO TABS
1000.0000 mg | ORAL_TABLET | Freq: Once | ORAL | Status: AC
  Administered 2022-04-27: 1000 mg via ORAL
  Filled 2022-04-27: qty 2

## 2022-04-27 MED ORDER — POLYETHYLENE GLYCOL 3350 17 G PO PACK: 17.0000 g | PACK | Freq: Every day | ORAL | Status: DC | PRN

## 2022-04-27 MED ORDER — ENOXAPARIN SODIUM 40 MG/0.4ML IJ SOSY: 40.0000 mg | PREFILLED_SYRINGE | INTRAMUSCULAR | Status: DC

## 2022-04-27 MED ORDER — GUAIFENESIN-DM 100-10 MG/5ML PO SYRP
10.0000 mL | ORAL_SOLUTION | Freq: Three times a day (TID) | ORAL | Status: DC
  Administered 2022-04-27 – 2022-04-28 (×2): 10 mL via ORAL
  Filled 2022-04-27 (×2): qty 10

## 2022-04-27 MED ORDER — ONDANSETRON HCL 4 MG/2ML IJ SOLN: 4.0000 mg | Freq: Four times a day (QID) | INTRAMUSCULAR | Status: DC | PRN

## 2022-04-27 MED ORDER — METHYLPREDNISOLONE SODIUM SUCC 125 MG IJ SOLR
80.0000 mg | Freq: Two times a day (BID) | INTRAMUSCULAR | Status: DC
  Administered 2022-04-28: 80 mg via INTRAVENOUS
  Filled 2022-04-27: qty 2

## 2022-04-27 MED ORDER — DOXYCYCLINE HYCLATE 100 MG PO TABS
100.0000 mg | ORAL_TABLET | Freq: Once | ORAL | Status: AC
  Administered 2022-04-27: 100 mg via ORAL
  Filled 2022-04-27: qty 1

## 2022-04-27 MED ORDER — IMIPRAMINE HCL 25 MG PO TABS
25.0000 mg | ORAL_TABLET | Freq: Every day | ORAL | Status: DC
  Administered 2022-04-27: 25 mg via ORAL
  Filled 2022-04-27 (×5): qty 1

## 2022-04-27 MED ORDER — ACETAMINOPHEN 650 MG RE SUPP: 650.0000 mg | Freq: Four times a day (QID) | RECTAL | Status: DC | PRN

## 2022-04-27 NOTE — ED Triage Notes (Signed)
Per patient she has been coughing and having chills. Patient has a history of asthma and complaints of SHOB. Patient states her albuterol inhaler has not been helping her. Patient states 30 minutes PTA for [facility].

## 2022-04-27 NOTE — Assessment & Plan Note (Addendum)
Resume Norvasc

## 2022-04-27 NOTE — Assessment & Plan Note (Addendum)
Resume imipramine, buspirone

## 2022-04-27 NOTE — Assessment & Plan Note (Addendum)
Om Aimovig Q30days also on PRN sumatriptan and Topiramate

## 2022-04-27 NOTE — ED Notes (Signed)
Per registration patient was unable to talk in complete sentences and was hunched over the registration desk. Patient apparently has syncopal episode and needed to be assisted in a wheelchair.

## 2022-04-27 NOTE — H&P (Addendum)
History and Physical    Rebecca Boyd VHQ:[id number] DOB: May 22, 1975 DOA: 04/27/2022  PCP: Fayrene Helper, MD   Patient coming from: Home  I have personally briefly reviewed patient's old medical records in [place]  Chief Complaint: Difficulty breathing  HPI: Rebecca Boyd is a 46 y.o. female with medical history significant for asthma, disc, HSV, hypertension, migraines. Patient presented to the ED with complaints of cough, congestion, sore throat, ongoing for the past week.  Today started having difficulty breathing with wheezing.  She reports positive chest tightness, and some mild chest pain across her chest.  No lower extremity swelling.  She used her home inhalers without relief. She has never required hospitalization or intubation for asthma exacerbation.  Has not smoked any cigarettes in over 20 years.  ED Course: Per chart notes, patient had a syncopal episode here and needed to be assisted in a wheelchair.  Patient does not remember passing out. Temperature 98.7.  Heart rate 115-146.  Respiratory rate 24-40.  Blood pressure systolic 413K to 440N.  O2 sats 90 to 100% on room air, but due to severe tachypnea, and work of breathing, patient was placed on BiPAP.  She was also wheezing initially. VBG showed pH of 7.46, pCO2 of 43. Potassium 3.1. Chest x-ray- Persistent but slightly improved bandlike opacities at the RIGHT and LEFT lung base. Findings may represent atelectasis or scarring. Reactive airway disease or viral illness could have a similar appearance but findings appear improved compared to previous imaging.  IV ceftriaxone and azithromycin.  Hospitalist admit for asthma exacerbation.  Review of Systems: As per HPI all other systems reviewed and negative.  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   Initial dx is 05/2013    Hypertension    Migraine    Migraines    Miscarriage 2009   Neck pain    Nondisplaced fracture of fifth metatarsal bone, left foot, subsequent encounter for fracture with routine healing 08/15/18 09/25/2018   Pruritus 09/29/2018   Pruritus ani 09/25/2018   Rash and nonspecific skin eruption 05/14/2017   Vaginitis    cyctitis    Past Surgical History:  Procedure Laterality Date   cervical cryotherapy N/A 1999   COLONOSCOPY WITH PROPOFOL N/A 05/10/2021   Procedure: COLONOSCOPY WITH PROPOFOL;  Surgeon: Eloise Harman, DO;  Location: [facility];  Service: Endoscopy;  Laterality: N/A;  11:30am   DILITATION & CURRETTAGE/HYSTROSCOPY WITH THERMACHOICE ABLATION  08/26/2013   Procedure: DILATATION & CURETTAGE/HYSTEROSCOPY WITH THERMACHOICE ENDOMETRIAL ABLATION Procedure #2 Total Therapy Time=min       sec;  Surgeon: Jonnie Kind, MD;  Location: [facility];  Service: Gynecology;;   LAPAROSCOPIC BILATERAL SALPINGECTOMY Bilateral 08/26/2013   Procedure: LAPAROSCOPIC BILATERAL SALPINGECTOMY AND REMOVAL OF LEFT PERITUBAL CYST Procedure #1;  Surgeon: Jonnie Kind, MD;  Location: [facility];  Service: Gynecology;  Laterality: Bilateral;   LAPAROSCOPIC LYSIS OF ADHESIONS  08/26/2013  Procedure: LAPAROSCOPIC LYSIS OF ADHESIONS Procedure #1;  Surgeon: Jonnie Kind, MD;  Location: [facility];  Service: Gynecology;;   POLYPECTOMY  05/10/2021   Procedure: POLYPECTOMY;  Surgeon: Eloise Harman, DO;  Location: [facility];  Service: Endoscopy;;   VULVAR LESION REMOVAL  08/26/2013   Procedure: REMOVAL OF VULVAR SEBACEOUS CYST Procedure #3;  Surgeon: Jonnie Kind,  MD;  Location: [facility];  Service: Gynecology;;     reports that she quit smoking about 5 years ago. Her smoking use included cigars and cigarettes. She has never used smokeless tobacco. She reports current alcohol use. She reports that she does not use drugs.  Allergies  Allergen Reactions   Diphenhydramine Hcl Anaphylaxis    (Benadryl)   Orange Fruit Hives, Shortness Of Breath, Itching and Other (See Comments)    MAKES IT DIFFICULT TO BREATH   Latex Rash    Family History  Problem Relation Age of Onset   Depression Mother    Drug abuse Mother    Hypertension Father    Breast cancer Sister        dx 43   Hypertension Maternal Grandmother    Hypertension Maternal Grandfather    Lung cancer Maternal Grandfather    Hypertension Paternal Grandmother    Breast cancer Paternal Grandmother        dx late 42s   Colon cancer Maternal Aunt        62   Colon cancer Cousin 69       maternal    Prior to Admission medications   Medication Sig Start Date End Date Taking? Authorizing Provider  albuterol (VENTOLIN HFA) 108 (90 Base) MCG/ACT inhaler Inhale 1-2 puffs into the lungs every 6 (six) hours as needed for wheezing or shortness of breath. 09/01/21  Yes Prince Rome, PA-C  amLODipine (NORVASC) 10 MG tablet Take 1 tablet (10 mg total) by mouth daily. 02/10/22  Yes Fayrene Helper, MD  busPIRone (BUSPAR) 5 MG tablet Take 1 tablet (5 mg total) by mouth 3 (three) times daily. 03/02/22  Yes Fayrene Helper, MD  cetirizine (ZYRTEC ALLERGY) 10 MG tablet Take 1 tablet (10 mg total) by mouth daily for 14 days. 09/01/21 04/27/22 Yes Prince Rome, PA-C  Erenumab-aooe (AIMOVIG) 70 MG/ML SOAJ Inject 70 mg into the skin every 30 (thirty) days. 08/12/19  Yes Suzzanne Cloud, NP  fluticasone (FLONASE) 50 MCG/ACT nasal spray Place 1 spray into both nostrils daily. 09/01/21  Yes Prince Rome, PA-C  gabapentin (NEURONTIN) 300 MG capsule Take 1 capsule (300 mg total) by mouth at bedtime.  12/01/21  Yes Carole Civil, MD  imipramine (TOFRANIL) 25 MG tablet Take 25 mg by mouth at bedtime. 06/16/21  Yes [provider]  montelukast (SINGULAIR) 10 MG tablet Take 1 tablet (10 mg total) by mouth at bedtime. 06/15/21  Yes Fayrene Helper, MD  oxyCODONE-acetaminophen (PERCOCET/ROXICET) 5-325 MG tablet Take 1-2 tablets by mouth 2 (two) times daily as needed for severe pain. 03/10/20  Yes [provider]  promethazine-dextromethorphan (PROMETHAZINE-DM) 6.25-15 MG/5ML syrup Take one teaspoon at bedtime as needed, for excessive cough 04/26/22  Yes Fayrene Helper, MD  SUMAtriptan [facility]) 6 MG/0.5ML SOLN injection Use one injection at onset of migraine.  May repeat in 2 hrs, if needed.  Max dose: 2 inj/day. This is a 30 day prescription. 08/12/19  Yes Suzzanne Cloud, NP  topiramate (TOPAMAX) 100 MG tablet Take 1 tablet (100 mg total) by mouth 2 (  two) times daily. 08/12/19  Yes Suzzanne Cloud, NP  acyclovir (ZOVIRAX) 800 MG tablet Take 1 tablet (800 mg total) by mouth 3 (three) times daily. Patient not taking: Reported on 04/27/2022 10/19/21   Fayrene Helper, MD  betamethasone dipropionate 0.05 % cream Apply topically 2 (two) times daily. 01/06/22   Fayrene Helper, MD  escitalopram (LEXAPRO) 10 MG tablet Take 1 tablet (10 mg total) by mouth daily. Patient not taking: Reported on 04/27/2022 12/29/21   Fayrene Helper, MD  potassium chloride SA (KLOR-CON M) 20 MEQ tablet Take 1 tablet (20 mEq total) by mouth daily. Patient not taking: Reported on 04/27/2022 09/30/21   Fayrene Helper, MD  spironolactone (ALDACTONE) 50 MG tablet Take 1 tablet (50 mg total) by mouth daily. Patient not taking: Reported on 04/27/2022 12/29/21   Fayrene Helper, MD    Physical Exam: Vitals:   04/27/22 1700 04/27/22 1800 04/27/22 1820 04/27/22 1900  BP: (!) 156/96 (!) 140/91  (!) 138/96  Pulse: (!) 129 (!) 134 (!) 126 (!) 145  Resp: (!) 23 (!) 38 (!) 40 (!) 35  Temp:       TempSrc:      SpO2: 100% 90% 100% 99%  Weight:      Height:        Constitutional: Currently on BiPAP Vitals:   04/27/22 1700 04/27/22 1800 04/27/22 1820 04/27/22 1900  BP: (!) 156/96 (!) 140/91  (!) 138/96  Pulse: (!) 129 (!) 134 (!) 126 (!) 145  Resp: (!) 23 (!) 38 (!) 40 (!) 35  Temp:      TempSrc:      SpO2: 100% 90% 100% 99%  Weight:      Height:       Eyes: PERRL, lids and conjunctivae normal ENMT: Mucous membranes are moist.  Neck: normal, supple, no masses, no thyromegaly Respiratory: On BiPAP, tachypneic,  Cardiovascular: Tachycardic, regular rate and rhythm, no murmurs / rubs / gallops. No extremity edema.  Extremities warm. Abdomen: no tenderness, no masses palpated. No hepatosplenomegaly. Bowel sounds positive.  Musculoskeletal: no clubbing / cyanosis. No joint deformity upper and lower extremities. Skin: no rashes, lesions, ulcers. No induration Neurologic: No apparent cranial nerve abnormality, moving extremities spontaneously. Psychiatric: Normal judgment and insight. Alert and oriented x 3. Normal mood.   Labs on Admission: I have personally reviewed following labs and imaging studies  CBC: Recent Labs  Lab 04/27/22 1639  WBC 13.8*  NEUTROABS 11.0*  HGB 14.2  HCT 41.1  MCV 87.6  PLT 657   Basic Metabolic Panel: Recent Labs  Lab 04/27/22 1639  NA 137  K 3.1*  CL 106  CO2 27  GLUCOSE 127*  BUN 9  CREATININE 0.80  CALCIUM 9.3   Radiological Exams on Admission: DG Chest Port 1 View  Result Date: 04/27/2022 CLINICAL DATA:  Shortness of breath. EXAM: PORTABLE CHEST 1 VIEW COMPARISON:  Sep 01, 2021. FINDINGS: EKG leads project over the chest. Trachea midline. Cardiomediastinal contours and hilar structures are stable. The bandlike linear opacities at the RIGHT and LEFT lung base are slightly improved compared to previous imaging but persist particularly in the LEFT lower chest. No lobar level consolidative process. No pneumothorax. On limited  assessment no acute skeletal process. IMPRESSION: Persistent but slightly improved bandlike opacities at the RIGHT and LEFT lung base. Findings may represent atelectasis or scarring. Reactive airway disease or viral illness could have a similar appearance but findings appear improved compared to previous imaging. Electronically Signed  By: Zetta Bills M.D.   On: 04/27/2022 17:04    EKG: Independently reviewed.  Sinus tachycardia rate 119.  QTc 466.  No significant ST or T wave changes from prior.  Assessment/Plan Principal Problem:   Asthma exacerbation Active Problems:   Syncope   Hypokalemia   Essential hypertension   Migraine syndrome   Prediabetes   Depression, major, single episode, moderate (HCC)   Assessment and Plan: * Asthma exacerbation Presenting with dyspnea, tachypnea, increased work of breathing, O2 sats 90 to 100% on room air, was placed on BiPAP due to respiratory distress.  VBG shows pH of 7.46, pCO2 of 46.  Chest x-ray-atelectasis or scarring versus reactive airway disease or viral illness-findings persistent, but improved from prior.  Likely provoked by viral illness.  COVID influenza and RSV negative.  Denies tobacco abuse.  No prior intubations for asthma exacerbation. Hour long neb likely contributing to tachycardia. -Albuterol nebs scheduled and as needed -Peak flow -IV ceftriaxone and doxyxycline given in ED for possible pneumonia, continue with doxycycline for now -Check procalcitonin -Obtain ABG -Continue BiPAP -IV solumedrol 125 given, continue 80 twice daily -Respiratory protocol -Mucolytic's - UDS -Limit oral intake for now, to respiratory status improved   Syncope Witnessed here in ED. Does not remember the event.  Reports chest tightness with mild chest pains.  No chest symptoms prior to today.  Syncope in the setting of acute respiratory distress, acute likely viral illness.  EKG without ST or T wave abnormalities.  Non-smoker. -Trend Troponin   -Hydrate   Hypokalemia Potassium 3.1. - N/s + 40 kcl 100cc/hr x 20 hrs - Check Mag  Depression, major, single episode, moderate (HCC) Resume imipramine, buspirone  Prediabetes - Hgba1c - CBG Q12hr while on steroids  Migraine syndrome Om Aimovig Q30days also on PRN sumatriptan and Topiramate  Essential hypertension Resume Norvasc  DVT prophylaxis: Lovenox Code Status: FULL Family Communication: Mother at bedside Disposition Plan: ~ 2 days Consults called: None Admission status: Obs stepdown   Author: Bethena Roys, MD 04/27/2022 9:58 PM  For on call review [email or web address].

## 2022-04-27 NOTE — Assessment & Plan Note (Signed)
Potassium 3.1. - N/s + 40 kcl 100cc/hr x 20 hrs - Check Mag

## 2022-04-27 NOTE — Assessment & Plan Note (Signed)
Witnessed here in ED. Does not remember the event.  Reports chest tightness with mild chest pains.  No chest symptoms prior to today.  Syncope in the setting of acute respiratory distress, acute likely viral illness.  EKG without ST or T wave abnormalities.  Non-smoker. -Trend Troponin  -Hydrate

## 2022-04-27 NOTE — Assessment & Plan Note (Addendum)
Presenting with dyspnea, tachypnea, increased work of breathing, O2 sats 90 to 100% on room air, was placed on BiPAP due to respiratory distress.  VBG shows pH of 7.46, pCO2 of 46.  Chest x-ray-atelectasis or scarring versus reactive airway disease or viral illness-findings persistent, but improved from prior.  Likely provoked by viral illness.  COVID influenza and RSV negative.  Denies tobacco abuse.  No prior intubations for asthma exacerbation. Hour long neb likely contributing to tachycardia. -Albuterol nebs scheduled and as needed -Peak flow -IV ceftriaxone and doxyxycline given in ED for possible pneumonia, continue with doxycycline for now -Check procalcitonin -Obtain ABG -Continue BiPAP -IV solumedrol 125 given, continue 80 twice daily -Respiratory protocol -Mucolytic's - UDS -Limit oral intake for now, to respiratory status improved

## 2022-04-27 NOTE — ED Provider Notes (Signed)
[facility] EMERGENCY DEPARTMENT Provider Note   CSN: [id number] Arrival date & time: 04/27/22  1600     History  Chief Complaint  Patient presents with   Shortness of Breath    Rebecca Boyd is a 46 y.o. female presenting to emergency department with shortness of breath.  Patient reports that she had some cough and congestion earlier this week but it abruptly worsened today.  She has a history of asthma and tried using her inhaler but did not help.  She feels that she has a difficult time getting air in and out.  She denies history of intubation for her asthma.  HPI     Home Medications Prior to Admission medications   Medication Sig Start Date End Date Taking? Authorizing Provider  busPIRone (BUSPAR) 5 MG tablet Take 1 tablet (5 mg total) by mouth 3 (three) times daily. 03/02/22  Yes Fayrene Helper, MD  cetirizine (ZYRTEC ALLERGY) 10 MG tablet Take 1 tablet (10 mg total) by mouth daily for 14 days. 09/01/21 04/27/22 Yes Prince Rome, PA-C  Erenumab-aooe (AIMOVIG) 70 MG/ML SOAJ Inject 70 mg into the skin every 30 (thirty) days. 08/12/19  Yes Suzzanne Cloud, NP  fluticasone (FLONASE) 50 MCG/ACT nasal spray Place 1 spray into both nostrils daily. 09/01/21  Yes Prince Rome, PA-C  fluticasone-salmeterol (ADVAIR DISKUS) 500-50 MCG/ACT AEPB Inhale 1 puff into the lungs in the morning and at bedtime. 04/28/22  Yes Emokpae, Courage, MD  gabapentin (NEURONTIN) 300 MG capsule Take 1 capsule (300 mg total) by mouth at bedtime. 12/01/21  Yes Carole Civil, MD  imipramine (TOFRANIL) 25 MG tablet Take 25 mg by mouth at bedtime. 06/16/21  Yes [provider]  predniSONE (DELTASONE) 20 MG tablet Take 2 tablets (40 mg total) by mouth daily with breakfast for 5 days. 04/28/22 05/03/22 Yes Emokpae, Courage, MD  SUMAtriptan (IMITREX) 6 MG/0.5ML SOLN injection Use one injection at onset of migraine.  May repeat in 2 hrs, if needed.  Max dose: 2 inj/day. This is a 30 day  prescription. 08/12/19  Yes Suzzanne Cloud, NP  topiramate (TOPAMAX) 100 MG tablet Take 1 tablet (100 mg total) by mouth 2 (two) times daily. 08/12/19  Yes Suzzanne Cloud, NP  albuterol (VENTOLIN HFA) 108 (90 Base) MCG/ACT inhaler Inhale 1-2 puffs into the lungs every 4 (four) hours as needed for wheezing or shortness of breath. 04/28/22   Roxan Hockey, MD  amLODipine (NORVASC) 10 MG tablet Take 1 tablet (10 mg total) by mouth daily. 04/28/22   Roxan Hockey, MD  betamethasone dipropionate 0.05 % cream Apply topically 2 (two) times daily. 01/06/22   Fayrene Helper, MD  escitalopram (LEXAPRO) 10 MG tablet Take 1 tablet (10 mg total) by mouth daily. Patient not taking: Reported on 04/27/2022 12/29/21   Fayrene Helper, MD  guaiFENesin-dextromethorphan [facility] DM) 100-10 MG/5ML syrup Take 10 mLs by mouth every 8 (eight) hours. 04/28/22   Roxan Hockey, MD  montelukast (SINGULAIR) 10 MG tablet Take 1 tablet (10 mg total) by mouth at bedtime. 04/28/22   Roxan Hockey, MD      Allergies    Diphenhydramine hcl, Orange fruit, and Latex    Review of Systems   Review of Systems  Physical Exam Updated Vital Signs BP 135/76   Pulse (!) 117   Temp 98.6 F (37 C) (Oral)   Resp (!) 26   Ht 5' (1.524 m)   Wt 83.9 kg   SpO2 94%  BMI 36.13 kg/m  Physical Exam Constitutional:      General: She is not in acute distress. HENT:     Head: Normocephalic and atraumatic.  Eyes:     Conjunctiva/sclera: Conjunctivae normal.     Pupils: Pupils are equal, round, and reactive to light.  Cardiovascular:     Rate and Rhythm: Normal rate and regular rhythm.  Pulmonary:     Effort: Pulmonary effort is normal. No respiratory distress.     Comments: RR 30, diminished breath sounds bilaterally, patient is able to speak in short sentences.  Faint expiratory wheezing audible, poor air movement Abdominal:     General: There is no distension.     Tenderness: There is no abdominal tenderness.   Skin:    General: Skin is warm and dry.  Neurological:     General: No focal deficit present.     Mental Status: She is alert. Mental status is at baseline.  Psychiatric:        Mood and Affect: Mood normal.        Behavior: Behavior normal.     ED Results / Procedures / Treatments   Labs (all labs ordered are listed, but only abnormal results are displayed) Labs Reviewed  BASIC METABOLIC PANEL - Abnormal; Notable for the following components:      Result Value   Potassium 3.1 (*)    Glucose, Bld 127 (*)    Anion gap 4 (*)    All other components within normal limits  CBC WITH DIFFERENTIAL/PLATELET - Abnormal; Notable for the following components:   WBC 13.8 (*)    Neutro Abs 11.0 (*)    All other components within normal limits  BLOOD GAS, VENOUS - Abnormal; Notable for the following components:   pH, Ven 7.46 (*)    pCO2, Ven 43 (*)    Bicarbonate 30.6 (*)    Acid-Base Excess 6.0 (*)    All other components within normal limits  BLOOD GAS, ARTERIAL - Abnormal; Notable for the following components:   Acid-base deficit 4.1 (*)    All other components within normal limits  BASIC METABOLIC PANEL - Abnormal; Notable for the following components:   CO2 21 (*)    Glucose, Bld 268 (*)    All other components within normal limits  CBC - Abnormal; Notable for the following components:   WBC 12.6 (*)    All other components within normal limits  RESP PANEL BY RT-PCR (RSV, FLU A&B, COVID)  RVPGX2  PROCALCITONIN  MAGNESIUM  RAPID URINE DRUG SCREEN, HOSP PERFORMED  HIV ANTIBODY (ROUTINE TESTING W REFLEX)  HEMOGLOBIN A1C  POC URINE PREG, ED  TROPONIN I (HIGH SENSITIVITY)  TROPONIN I (HIGH SENSITIVITY)    EKG EKG Interpretation  Date/Time:  Thursday April 27 2022 16:12:01 EST Ventricular Rate:  119 PR Interval:  148 QRS Duration: 69 QT Interval:  331 QTC Calculation: 466 R Axis:   16 Text Interpretation: Sinus tachycardia Confirmed by Octaviano Glow [phone]) on  04/27/2022 4:40:28 PM  Radiology DG Chest Port 1 View  Result Date: 04/27/2022 CLINICAL DATA:  Shortness of breath. EXAM: PORTABLE CHEST 1 VIEW COMPARISON:  Sep 01, 2021. FINDINGS: EKG leads project over the chest. Trachea midline. Cardiomediastinal contours and hilar structures are stable. The bandlike linear opacities at the RIGHT and LEFT lung base are slightly improved compared to previous imaging but persist particularly in the LEFT lower chest. No lobar level consolidative process. No pneumothorax. On limited assessment no acute skeletal process. IMPRESSION:  Persistent but slightly improved bandlike opacities at the RIGHT and LEFT lung base. Findings may represent atelectasis or scarring. Reactive airway disease or viral illness could have a similar appearance but findings appear improved compared to previous imaging. Electronically Signed   By: Zetta Bills M.D.   On: 04/27/2022 17:04    Procedures .Critical Care  Performed by: Wyvonnia Dusky, MD Authorized by: Wyvonnia Dusky, MD   Critical care provider statement:    Critical care time (minutes):  45   Critical care time was exclusive of:  Separately billable procedures and treating other patients   Critical care was necessary to treat or prevent imminent or life-threatening deterioration of the following conditions:  Respiratory failure   Critical care was time spent personally by me on the following activities:  Ordering and performing treatments and interventions, ordering and review of laboratory studies, ordering and review of radiographic studies, pulse oximetry, review of old charts, examination of patient and evaluation of patient's response to treatment Comments:     Bipap for asthma respiratory failure     Medications Ordered in ED Medications  albuterol (PROVENTIL,VENTOLIN) solution continuous neb (10 mg/hr Nebulization Not Given 04/27/22 1758)  0.9 % NaCl with KCl 40 mEq / L  infusion (0 mL/hr Intravenous Stopped  04/28/22 1131)  amLODipine (NORVASC) tablet 10 mg (10 mg Oral Given 04/28/22 1053)  busPIRone (BUSPAR) tablet 5 mg (5 mg Oral Given 04/28/22 1053)  fluticasone (FLONASE) 50 MCG/ACT nasal spray 1 spray (1 spray Each Nare Not Given 04/28/22 1044)  imipramine (TOFRANIL) tablet 25 mg (25 mg Oral Given 04/27/22 2346)  montelukast (SINGULAIR) tablet 10 mg (10 mg Oral Given 04/27/22 2346)  enoxaparin (LOVENOX) injection 40 mg (40 mg Subcutaneous Not Given 04/28/22 1131)  acetaminophen (TYLENOL) tablet 650 mg (650 mg Oral Given 04/28/22 1040)    Or  acetaminophen (TYLENOL) suppository 650 mg ( Rectal See Alternative 04/28/22 1040)  ondansetron (ZOFRAN) tablet 4 mg (has no administration in time range)    Or  ondansetron (ZOFRAN) injection 4 mg (has no administration in time range)  polyethylene glycol (MIRALAX / GLYCOLAX) packet 17 g (has no administration in time range)  guaiFENesin-dextromethorphan (ROBITUSSIN DM) 100-10 MG/5ML syrup 10 mL (10 mLs Oral Given 04/28/22 0508)  methylPREDNISolone sodium succinate (SOLU-MEDROL) 125 mg/2 mL injection 80 mg (80 mg Intravenous Given 04/28/22 0803)  albuterol (PROVENTIL) (2.5 MG/3ML) 0.083% nebulizer solution 2.5 mg (has no administration in time range)  doxycycline (VIBRAMYCIN) 100 mg in sodium chloride 0.9 % 250 mL IVPB (0 mg Intravenous Stopped 04/28/22 0902)  albuterol (PROVENTIL) (2.5 MG/3ML) 0.083% nebulizer solution 2.5 mg (2.5 mg Nebulization Given 04/28/22 0628)  albuterol (PROVENTIL) (2.5 MG/3ML) 0.083% nebulizer solution (10 mg Nebulization Given 04/27/22 1622)  magnesium sulfate IVPB 2 g 50 mL (0 g Intravenous Stopped 04/27/22 1811)  methylPREDNISolone sodium succinate (SOLU-MEDROL) 125 mg/2 mL injection 125 mg (125 mg Intravenous Given 04/27/22 1704)  cefTRIAXone (ROCEPHIN) 1 g in sodium chloride 0.9 % 100 mL IVPB (0 g Intravenous Stopped 04/27/22 1910)  doxycycline (VIBRA-TABS) tablet 100 mg (100 mg Oral Given 04/27/22 1812)  potassium  chloride 10 mEq in 100 mL IVPB (0 mEq Intravenous Stopped 04/27/22 2331)  acetaminophen (TYLENOL) tablet 1,000 mg (1,000 mg Oral Given 04/27/22 2210)    ED Course/ Medical Decision Making/ A&P Clinical Course as of 04/28/22 1421  Thu Apr 27, 2022  1753 Patient reassessed and wheezing and air movement seems to have improved but she does not feel subjectively better and  her respiratory rate is still near 40 breaths/min, pulse oxygenation is 90 to 92% on room air.  We will start her on BiPAP to see if this helps with her work of breathing.  I have also ordered antibiotics that she has a leukocytosis concern for bilateral infiltrates on chest x-ray.  She will need admission to the hospital.  I informed the patient and her family member of this and they are in agreement. [MT]  2028 Patient reassessed now after being on BiPAP for nearly 2 hours.  Her respiratory rate is significantly improved and 40 breaths a minute to 26.  She is tachycardic with a heart rate of 140 but this is to be expected with 2 hours of nebulizers given.  We can also replenish some potassium as it was low to begin with.  She will need to be admitted but is stable for admission at this time on BiPAP.  She is moving air much better on exam. [MT]  2028 Lower suspicion for pulmonary embolism at this time as this is consistent with asthma exacerbation in the setting of pneumonia. [MT]    Clinical Course User Index [MT] Debraann Livingstone, Carola Rhine, MD                           Medical Decision Making Amount and/or Complexity of Data Reviewed Labs: ordered. Radiology: ordered.  Risk Prescription drug management. Decision regarding hospitalization.   This patient presents to the ED with concern for shortness of breath, wheezing. This involves an extensive number of treatment options, and is a complaint that carries with it a high risk of complications and morbidity.  The differential diagnosis includes COPD exacerbation versus asthma  exacerbation versus pneumonia versus viral URI versus other  Co-morbidities that complicate the patient evaluation: History of asthma  I ordered and personally interpreted labs.  The pertinent results include: No acidosis on venous gas.  White blood cell count elevated at 13.8.  Potassium 3.1.  No AKI.  No anion gap.  I ordered imaging studies including dg chest I independently visualized and interpreted imaging which showed multifocal pneumonia I agree with the radiologist interpretation  The patient was maintained on a cardiac monitor.  I personally viewed and interpreted the cardiac monitored which showed an underlying rhythm of: Sinus tachycardia  Per my interpretation the patient's ECG shows sinus tachycardia with no acute ischemic findings  I ordered medication including nebulizers, steroids, magnesium for suspected reactive airway disease.  IV potassium for hypokalemia.  BiPAP for tachypnea and hypoxia.  IV antibiotics for community pneumonia.  I have reviewed the patients home medicines and have made adjustments as needed  Test Considered: Lower suspicion for acute PE in this clinical setting, do not feel CT angiogram indicated at this time.  Low suspicion for sepsis or bacteremia.  After the interventions noted above, I reevaluated the patient and found that they have: improved  Dispostion:  After consideration of the diagnostic results and the patients response to treatment, I feel that the patent would benefit from medical admission to stepdown unit, on BiPAP.         Final Clinical Impression(s) / ED Diagnoses Final diagnoses:  Pneumonia of both lower lobes due to infectious organism  Exacerbation of asthma, unspecified asthma severity, unspecified whether persistent    Rx / DC Orders ED Discharge Orders          Ordered    montelukast (SINGULAIR) 10 MG tablet  Daily at  bedtime        04/28/22 1046    amLODipine (NORVASC) 10 MG tablet  Daily        04/28/22  1046    albuterol (VENTOLIN HFA) 108 (90 Base) MCG/ACT inhaler  Every 4 hours PRN        04/28/22 1046    guaiFENesin-dextromethorphan (ROBITUSSIN DM) 100-10 MG/5ML syrup  Every 8 hours        04/28/22 1046    predniSONE (DELTASONE) 20 MG tablet  Daily with breakfast        04/28/22 1046    fluticasone-salmeterol (ADVAIR DISKUS) 500-50 MCG/ACT AEPB  2 times daily        04/28/22 1046    Increase activity slowly        04/28/22 1046    Diet - low sodium heart healthy        04/28/22 1046    Discharge instructions       Comments: 1)Please take Prednisone as prescribed for your asthma 2)Start Advair-one inhalation twice daily for asthma   04/28/22 1046    Call MD for:  temperature >100.4        04/28/22 1046    Call MD for:  persistant nausea and vomiting        04/28/22 1046    Call MD for:  difficulty breathing, headache or visual disturbances        04/28/22 1046    Call MD for:  persistant dizziness or light-headedness        04/28/22 1046              Wyvonnia Dusky, MD 04/28/22 1421

## 2022-04-27 NOTE — Assessment & Plan Note (Signed)
-   Hgba1c - CBG Q12hr while on steroids

## 2022-04-28 DIAGNOSIS — J4521 Mild intermittent asthma with (acute) exacerbation: Secondary | ICD-10-CM | POA: Diagnosis not present

## 2022-04-28 DIAGNOSIS — E876 Hypokalemia: Secondary | ICD-10-CM | POA: Diagnosis not present

## 2022-04-28 LAB — CBC
HCT: 36.2 % (ref 36.0–46.0)
Hemoglobin: 12.4 g/dL (ref 12.0–15.0)
MCH: 30 pg (ref 26.0–34.0)
MCHC: 34.3 g/dL (ref 30.0–36.0)
MCV: 87.7 fL (ref 80.0–100.0)
Platelets: 222 10*3/uL (ref 150–400)
RBC: 4.13 MIL/uL (ref 3.87–5.11)
RDW: 13.4 % (ref 11.5–15.5)
WBC: 12.6 10*3/uL — ABNORMAL HIGH (ref 4.0–10.5)
nRBC: 0 % (ref 0.0–0.2)

## 2022-04-28 LAB — BASIC METABOLIC PANEL
Anion gap: 9 (ref 5–15)
BUN: 11 mg/dL (ref 6–20)
CO2: 21 mmol/L — ABNORMAL LOW (ref 22–32)
Calcium: 9.1 mg/dL (ref 8.9–10.3)
Chloride: 108 mmol/L (ref 98–111)
Creatinine, Ser: 0.79 mg/dL (ref 0.44–1.00)
GFR, Estimated: >60 mL/min (ref >60)
Glucose, Bld: 268 mg/dL — ABNORMAL HIGH (ref 70–99)
Potassium: 3.5 mmol/L (ref 3.5–5.1)
Sodium: 138 mmol/L (ref 135–145)

## 2022-04-28 LAB — RAPID URINE DRUG SCREEN, HOSP PERFORMED
Amphetamines: NOT DETECTED
Barbiturates: NOT DETECTED
Benzodiazepines: NOT DETECTED
Cocaine: NOT DETECTED
Opiates: NOT DETECTED
Tetrahydrocannabinol: NOT DETECTED

## 2022-04-28 LAB — TROPONIN I (HIGH SENSITIVITY): Troponin I (High Sensitivity): 5 ng/L (ref <18)

## 2022-04-28 LAB — HIV ANTIBODY (ROUTINE TESTING W REFLEX): HIV Screen 4th Generation wRfx: NONREACTIVE

## 2022-04-28 MED ORDER — GUAIFENESIN-DM 100-10 MG/5ML PO SYRP: 10.0000 mL | ORAL_SOLUTION | Freq: Three times a day (TID) | ORAL | 0 refills | Status: DC

## 2022-04-28 MED ORDER — MONTELUKAST SODIUM 10 MG PO TABS: 10.0000 mg | ORAL_TABLET | Freq: Every day | ORAL | 3 refills | Status: DC

## 2022-04-28 MED ORDER — ALBUTEROL SULFATE HFA 108 (90 BASE) MCG/ACT IN AERS: 1.0000 | INHALATION_SPRAY | RESPIRATORY_TRACT | 3 refills | Status: DC | PRN

## 2022-04-28 MED ORDER — AMLODIPINE BESYLATE 10 MG PO TABS: 10.0000 mg | ORAL_TABLET | Freq: Every day | ORAL | 2 refills | Status: DC

## 2022-04-28 MED ORDER — PREDNISONE 20 MG PO TABS: 40.0000 mg | ORAL_TABLET | Freq: Every day | ORAL | 0 refills | Status: AC

## 2022-04-28 MED ORDER — FLUTICASONE-SALMETEROL 500-50 MCG/ACT IN AEPB: 1.0000 | INHALATION_SPRAY | Freq: Two times a day (BID) | RESPIRATORY_TRACT | 5 refills | Status: DC

## 2022-04-28 NOTE — Discharge Instructions (Signed)
1)Please take Prednisone as prescribed for your asthma 2)Start Advair-one inhalation twice daily for asthma

## 2022-04-28 NOTE — ED Notes (Signed)
At this time, pt able to tolerate being of BiPAP and on HFNC.  Assisted pt in transferring to [facility], pt voided amber urine.  Assisted back to bed.  Sats remained >90%.  Pt dyspneic after transfer, encouraged deep breathing and rest.

## 2022-04-28 NOTE — Progress Notes (Signed)
  Transition of Care [facility]) Screening Note   Patient Details  Name: Rebecca Boyd Date of Birth: 11-27-1975   Transition of Care [facility]) CM/SW Contact:    Iona Beard, [place] Phone Number: 04/28/2022, 10:26 AM    Transition of Care Department [facility]) has reviewed patient and no TOC needs have been identified at this time. We will continue to monitor patient advancement through interdisciplinary progression rounds. If new patient transition needs arise, please place a TOC consult.

## 2022-04-28 NOTE — Discharge Summary (Signed)
Rebecca Boyd, is a 46 y.o. female  DOB 1975/07/24  MRN [id number].  Admission date:  04/27/2022  Admitting Physician  Bethena Roys, MD  Discharge Date:  04/28/2022   Primary MD  Fayrene Helper, MD  Recommendations for primary care physician for things to follow:   1)Please take Prednisone as prescribed for your asthma 2)Start Advair-one inhalation twice daily for asthma  Admission Diagnosis  Asthma exacerbation [J45.901]   Discharge Diagnosis  Asthma exacerbation [J45.901]    Principal Problem:   Asthma exacerbation Active Problems:   Syncope   Hypokalemia   Essential hypertension   Migraine syndrome   Prediabetes   Depression, major, single episode, moderate (HCC)      Past Medical History:  Diagnosis Date   Anemia    Anxiety    Bacterial vaginosis 07/27/2014   Cervical neck pain with evidence of disc disease 10/02/2013   Depressive disorder, not elsewhere classified    Epistaxis 07/09/2019   Family history of breast cancer    Family history of breast cancer in sister 09/25/2018   Family history of colon cancer    Family history of lung cancer    FH: breast cancer in relative when <36 years old 06/10/2018   Dx at age 29   Headache 03/16/2016   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   Initial dx is 05/2013    Hypertension    Migraine    Migraines    Miscarriage 2009   Neck pain    Nondisplaced fracture of fifth metatarsal bone, left foot, subsequent encounter for fracture with routine healing 08/15/18 09/25/2018   Pruritus 09/29/2018   Pruritus ani 09/25/2018   Rash and nonspecific skin eruption 05/14/2017   Vaginitis    cyctitis    Past Surgical History:  Procedure Laterality Date   cervical cryotherapy N/A 1999   COLONOSCOPY WITH PROPOFOL N/A 05/10/2021   Procedure: COLONOSCOPY WITH PROPOFOL;  Surgeon: Eloise Harman, DO;  [facility];  Service: Endoscopy;   Laterality: N/A;  11:30am   DILITATION & CURRETTAGE/HYSTROSCOPY WITH THERMACHOICE ABLATION  08/26/2013   Procedure: DILATATION & CURETTAGE/HYSTEROSCOPY WITH THERMACHOICE ENDOMETRIAL ABLATION Procedure #2 Total Therapy Time=min       sec;  Surgeon: Jonnie Kind, MD;  Location: [facility];  Service: Gynecology;;   LAPAROSCOPIC BILATERAL SALPINGECTOMY Bilateral 08/26/2013   Procedure: LAPAROSCOPIC BILATERAL SALPINGECTOMY AND REMOVAL OF LEFT PERITUBAL CYST Procedure #1;  Surgeon: Jonnie Kind, MD;  Location: [facility];  Service: Gynecology;  Laterality: Bilateral;   LAPAROSCOPIC LYSIS OF ADHESIONS  08/26/2013   Procedure: LAPAROSCOPIC LYSIS OF ADHESIONS Procedure #1;  Surgeon: Jonnie Kind, MD;  Location: [facility];  Service: Gynecology;;   POLYPECTOMY  05/10/2021   Procedure: POLYPECTOMY;  Surgeon: Eloise Harman, DO;  [facility];  Service: Endoscopy;;   VULVAR LESION REMOVAL  08/26/2013   Procedure: REMOVAL OF VULVAR SEBACEOUS CYST Procedure #3;  Surgeon: Jonnie Kind, MD;  Location: [facility];  Service: Gynecology;;     HPI  from the history and physical done on the day of admission:   Chief Complaint: Difficulty breathing   HPI: Rebecca MULLANE is a 46 y.o. female with medical history significant for asthma, disc, HSV, hypertension, migraines. Patient presented to the ED with complaints of cough, congestion, sore throat, ongoing for the past week.  Today started having difficulty breathing with wheezing.  She reports positive chest tightness, and some mild chest pain across her chest.  No lower extremity swelling.  She used her home inhalers without relief. She has never required hospitalization or intubation for asthma exacerbation.  Has not smoked any cigarettes in over 20 years.   ED Course: Per chart notes, patient had a syncopal episode here and needed to be assisted in a wheelchair.  Patient does not remember passing out. Temperature 98.7.  Heart rate 115-146.  Respiratory  rate 24-40.  Blood pressure systolic 638V to 564P.  O2 sats 90 to 100% on room air, but due to severe tachypnea, and work of breathing, patient was placed on BiPAP.  She was also wheezing initially. VBG showed pH of 7.46, pCO2 of 43. Potassium 3.1. Chest x-ray- Persistent but slightly improved bandlike opacities at the RIGHT and LEFT lung base. Findings may represent atelectasis or scarring. Reactive airway disease or viral illness could have a similar appearance but findings appear improved compared to previous imaging.   IV ceftriaxone and azithromycin.  Hospitalist admit for asthma exacerbation.   Review of Systems: As per HPI all other systems reviewed and negative.     Hospital Course:   Assessment and Plan: 1)Acute Asthma exacerbation Initially required BiPAP  -Hypoxia and respiratory distress has resolved  -Patient ambulated around the ED O2 sat 95 to 97% on room air post ambulation  -  Chest x-ray-atelectasis or scarring versus reactive airway disease or viral illness-findings persistent, but improved from prior.  Likely provoked by viral illness.   COVID,  influenza and RSV negative.   -ProCalcitonin less than 0.10  Syncope Witnessed here in ED. Does not remember the event.  Reports chest tightness with mild chest pains.  No chest symptoms prior to today.  Syncope in the setting of acute respiratory distress, acute likely viral illness.  EKG without ST or T wave abnormalities.  Non-smoker. -Ruled out for ACS by EKG and troponin  Leukocytosis--- reactive/stress-induced, leukocytosis may persist due to steroids  Hypokalemia -Replaced and normalized, magnesium WNL  Depression, major, single episode, moderate (HCC) Resume imipramine, buspirone  Prediabetes -A1c is 5.7 anticipate some elevation of glucose with steroids  Migraine syndrome Om Aimovig Q30days also on PRN sumatriptan and Topiramate  Essential hypertension Resume Norvasc  Discharge Condition:  Stable  Follow UP   Follow-up Information     Fayrene Helper, MD. Schedule an appointment as soon as possible for a visit in 1 week(s).   Specialty: Family Medicine Contact information: [address] [place] [address] [phone]                 Diet and Activity recommendation:  As advised  Discharge Instructions    Discharge Instructions     Call MD for:  difficulty breathing, headache or visual disturbances   Complete by: As directed    Call MD for:  persistant dizziness or light-headedness   Complete by: As directed    Call MD for:  persistant nausea and vomiting   Complete by: As directed    Call MD for:  temperature >100.4   Complete by:  As directed    Diet - low sodium heart healthy   Complete by: As directed    Discharge instructions   Complete by: As directed    1)Please take Prednisone as prescribed for your asthma 2)Start Advair-one inhalation twice daily for asthma   Increase activity slowly   Complete by: As directed         Discharge Medications     Allergies as of 04/28/2022       Reactions   Diphenhydramine Hcl Anaphylaxis   (Benadryl)   Orange Fruit Hives, Shortness Of Breath, Itching, Other (See Comments)   MAKES IT DIFFICULT TO BREATH   Latex Rash        Medication List     STOP taking these medications    acyclovir 800 MG tablet Commonly known as: ZOVIRAX   oxyCODONE-acetaminophen 5-325 MG tablet Commonly known as: PERCOCET/ROXICET   potassium chloride SA 20 MEQ tablet Commonly known as: KLOR-CON M   promethazine-dextromethorphan 6.25-15 MG/5ML syrup Commonly known as: PROMETHAZINE-DM   spironolactone 50 MG tablet Commonly known as: Aldactone       TAKE these medications    Aimovig 70 MG/ML Soaj Generic drug: Erenumab-aooe Inject 70 mg into the skin every 30 (thirty) days.   albuterol 108 (90 Base) MCG/ACT inhaler Commonly known as: VENTOLIN HFA Inhale 1-2 puffs into the lungs every  4 (four) hours as needed for wheezing or shortness of breath. What changed: when to take this   amLODipine 10 MG tablet Commonly known as: NORVASC Take 1 tablet (10 mg total) by mouth daily.   betamethasone dipropionate 0.05 % cream Apply topically 2 (two) times daily.   busPIRone 5 MG tablet Commonly known as: BUSPAR Take 1 tablet (5 mg total) by mouth 3 (three) times daily.   cetirizine 10 MG tablet Commonly known as: ZyrTEC Allergy Take 1 tablet (10 mg total) by mouth daily for 14 days.   escitalopram 10 MG tablet Commonly known as: Lexapro Take 1 tablet (10 mg total) by mouth daily.   fluticasone 50 MCG/ACT nasal spray Commonly known as: FLONASE Place 1 spray into both nostrils daily.   fluticasone-salmeterol 500-50 MCG/ACT Aepb Commonly known as: Advair Diskus Inhale 1 puff into the lungs in the morning and at bedtime.   gabapentin 300 MG capsule Commonly known as: NEURONTIN Take 1 capsule (300 mg total) by mouth at bedtime.   guaiFENesin-dextromethorphan 100-10 MG/5ML syrup Commonly known as: ROBITUSSIN DM Take 10 mLs by mouth every 8 (eight) hours.   imipramine 25 MG tablet Commonly known as: TOFRANIL Take 25 mg by mouth at bedtime.   montelukast 10 MG tablet Commonly known as: SINGULAIR Take 1 tablet (10 mg total) by mouth at bedtime.   predniSONE 20 MG tablet Commonly known as: DELTASONE Take 2 tablets (40 mg total) by mouth daily with breakfast for 5 days.   SUMAtriptan 6 MG/0.5ML Soln injection Commonly known as: Imitrex Use one injection at onset of migraine.  May repeat in 2 hrs, if needed.  Max dose: 2 inj/day. This is a 30 day prescription.   topiramate 100 MG tablet Commonly known as: TOPAMAX Take 1 tablet (100 mg total) by mouth 2 (two) times daily.       Major procedures and Radiology Reports - PLEASE review detailed and final reports for all details, in brief -   DG Chest Port 1 View  Result Date: 04/27/2022 CLINICAL DATA:   Shortness of breath. EXAM: PORTABLE CHEST 1 VIEW COMPARISON:  Sep 01, 2021.  FINDINGS: EKG leads project over the chest. Trachea midline. Cardiomediastinal contours and hilar structures are stable. The bandlike linear opacities at the RIGHT and LEFT lung base are slightly improved compared to previous imaging but persist particularly in the LEFT lower chest. No lobar level consolidative process. No pneumothorax. On limited assessment no acute skeletal process. IMPRESSION: Persistent but slightly improved bandlike opacities at the RIGHT and LEFT lung base. Findings may represent atelectasis or scarring. Reactive airway disease or viral illness could have a similar appearance but findings appear improved compared to previous imaging. Electronically Signed   By: Zetta Bills M.D.   On: 04/27/2022 17:04   DG INJECT DIAG/THERA/INC NEEDLE/CATH/PLC EPI/LUMB/SAC W/IMG  Result Date: 04/11/2022 CLINICAL DATA:  Lumbosacral spondylosis without myelopathy with radiculopathy. Moderate improvement following the first 2 epidural injections. Recurrent pain in the low back and legs, including in the anterior lower legs. Symptoms overall mildly worse on the left than the right. FLUOROSCOPY: Radiation Exposure Index (as provided by the fluoroscopic device): 1.60 mGy Kerma PROCEDURE: The procedure, risks, benefits, and alternatives were explained to the patient. Questions regarding the procedure were encouraged and answered. The patient understands and consents to the procedure. LUMBAR EPIDURAL INJECTION: An interlaminar approach was performed on the left at L4-5. The overlying skin was cleansed and anesthetized. A 3.5 inch 20 gauge epidural needle was advanced using loss-of-resistance technique. DIAGNOSTIC EPIDURAL INJECTION: Injection of Isovue-M 200 shows a good epidural pattern with spread above and below the level of needle placement, primarily on the left. No vascular opacification is seen. THERAPEUTIC EPIDURAL INJECTION: 80  mg of Depo-Medrol mixed with 3 mL of 1% lidocaine were instilled. The procedure was well-tolerated, and the patient was discharged thirty minutes following the injection in good condition. COMPLICATIONS: None immediate IMPRESSION: Technically successful interlaminar epidural injection on the left at L4-5. Electronically Signed   By: Quintero Bores M.D.   On: 04/11/2022 09:18   US Renal  Result Date: 04/10/2022 CLINICAL DATA:  Bilateral flank pain for 2 weeks EXAM: RENAL / URINARY TRACT ULTRASOUND COMPLETE COMPARISON:  CT scan of the abdomen and pelvis April 23, 2021 FINDINGS: Right Kidney: Renal measurements: 11.5 x 5.8 x 5.5 cm = volume: 192 mL. Contains a 3.8 cm parapelvic cyst. No follow-up imaging recommended for the cyst. Left Kidney: Renal measurements: 11.7 x 5.9 x 5.6 cm = volume: 201 mL. Echogenicity within normal limits. No mass or hydronephrosis visualized. Bladder: Evaluation of the bladder is limited due to lack of distention. No obvious abnormalities. Other: None. IMPRESSION: No significant abnormalities identified. Evaluation of the bladder is limited due to lack of distention. Electronically Signed   By: Dorise Bullion III M.D.   On: 04/10/2022 17:24    Micro Results   Recent Results (from the past 240 hour(s))  Resp panel by RT-PCR (RSV, Flu A&B, Covid) Anterior Nasal Swab     Status: None   Collection Time: 04/27/22  4:49 PM   Specimen: Anterior Nasal Swab  Result Value Ref Range Status   SARS Coronavirus 2 by RT PCR NEGATIVE NEGATIVE Final    Comment: (NOTE) SARS-CoV-2 target nucleic acids are NOT DETECTED.  The SARS-CoV-2 RNA is generally detectable in upper respiratory specimens during the acute phase of infection. The lowest concentration of SARS-CoV-2 viral copies this assay can detect is 138 copies/mL. A negative result does not preclude SARS-Cov-2 infection and should not be used as the sole basis for treatment or other patient management decisions. A negative result  may occur with  improper specimen collection/handling, submission of specimen other than nasopharyngeal swab, presence of viral mutation(s) within the areas targeted by this assay, and inadequate number of viral copies(<138 copies/mL). A negative result must be combined with clinical observations, patient history, and epidemiological information. The expected result is Negative.  Fact Sheet for Patients:  EntrepreneurPulse.com.au  Fact Sheet for Healthcare Providers:  IncredibleEmployment.be  This test is no t yet approved or cleared by the [place] FDA and  has been authorized for detection and/or diagnosis of SARS-CoV-2 by FDA under an Emergency Use Authorization (EUA). This EUA will remain  in effect (meaning this test can be used) for the duration of the COVID-19 declaration under Section 564(b)(1) of the Act, 21 U.S.C.section 360bbb-3(b)(1), unless the authorization is terminated  or revoked sooner.       Influenza A by PCR NEGATIVE NEGATIVE Final   Influenza B by PCR NEGATIVE NEGATIVE Final    Comment: (NOTE) The Xpert Xpress SARS-CoV-2/FLU/RSV plus assay is intended as an aid in the diagnosis of influenza from Nasopharyngeal swab specimens and should not be used as a sole basis for treatment. Nasal washings and aspirates are unacceptable for Xpert Xpress SARS-CoV-2/FLU/RSV testing.  Fact Sheet for Patients: EntrepreneurPulse.com.au  Fact Sheet for Healthcare Providers: IncredibleEmployment.be  This test is not yet approved or cleared by the [place] FDA and has been authorized for detection and/or diagnosis of SARS-CoV-2 by FDA under an Emergency Use Authorization (EUA). This EUA will remain in effect (meaning this test can be used) for the duration of the COVID-19 declaration under Section 564(b)(1) of the Act, 21 U.S.C. section 360bbb-3(b)(1), unless the authorization is terminated  or revoked.     Resp Syncytial Virus by PCR NEGATIVE NEGATIVE Final    Comment: (NOTE) Fact Sheet for Patients: EntrepreneurPulse.com.au  Fact Sheet for Healthcare Providers: IncredibleEmployment.be  This test is not yet approved or cleared by the [place] FDA and has been authorized for detection and/or diagnosis of SARS-CoV-2 by FDA under an Emergency Use Authorization (EUA). This EUA will remain in effect (meaning this test can be used) for the duration of the COVID-19 declaration under Section 564(b)(1) of the Act, 21 U.S.C. section 360bbb-3(b)(1), unless the authorization is terminated or revoked.  Performed at [facility], [address], [place], [place] [address]    Today   Subjective    Kelsye Loomer today has no new complaints -Weaned off oxygen -Ambulated in the hallways in the ED -no dizziness no palpitations --Hypoxia and respiratory distress has resolved  -Patient ambulated around the ED O2 sat 95 to 97% on room air post ambulation    Patient has been seen and examined prior to discharge   Objective   Blood pressure 132/81, pulse (!) 119, temperature 98.5 F (36.9 C), temperature source Oral, resp. rate 14, height 5' (1.524 m), weight 83.9 kg, SpO2 95 %.   Intake/Output Summary (Last 24 hours) at 04/28/2022 1047 Last data filed at 04/28/2022 0058 Gross per 24 hour  Intake 457.79 ml  Output 325 ml  Net 132.79 ml    Exam Gen:- Awake Alert, no acute distress  HEENT:- .AT, No sclera icterus Neck-Supple Neck,No JVD,.  Lungs-  CTAB , good air movement bilaterally CV- S1, S2 normal, regular Abd-  +ve B.Sounds, Abd Soft, No tenderness,    Extremity/Skin:- No  edema,   good pulses Psych-affect is appropriate, oriented x3 Neuro-no new focal deficits, no tremors    Data Review   CBC w Diff:  Lab Results  Component Value Date   WBC 12.6 (H) 04/28/2022   HGB 12.4 04/28/2022   HGB 14.1 03/30/2021   HCT  36.2 04/28/2022   HCT 41.5 03/30/2021   PLT 222 04/28/2022   PLT 274 03/30/2021   LYMPHOPCT 14 04/27/2022   MONOPCT 5 04/27/2022   EOSPCT 1 04/27/2022   BASOPCT 0 04/27/2022    CMP:  Lab Results  Component Value Date   NA 138 04/28/2022   NA 142 04/06/2022   K 3.5 04/28/2022   CL 108 04/28/2022   CO2 21 (L) 04/28/2022   BUN 11 04/28/2022   BUN 7 04/06/2022   CREATININE 0.79 04/28/2022   CREATININE 0.75 06/10/2018   PROT 7.0 04/06/2022   ALBUMIN 4.5 04/06/2022   BILITOT 0.3 04/06/2022   ALKPHOS 102 04/06/2022   AST 16 04/06/2022   ALT 22 04/06/2022  .  Total Discharge time is about 33 minutes  Roxan Hockey M.D on 04/28/2022 at 10:47 AM  Go to [email or web address] -  for contact info  [facility] - Office  [phone]

## 2022-04-29 LAB — HEMOGLOBIN A1C
Hgb A1c MFr Bld: 5.7 % — ABNORMAL HIGH (ref 4.8–5.6)
Mean Plasma Glucose: 117 mg/dL

## 2022-05-02 ENCOUNTER — Telehealth: Payer: Self-pay | Admitting: *Deleted

## 2022-05-02 ENCOUNTER — Encounter: Payer: Self-pay | Admitting: Family Medicine

## 2022-05-02 ENCOUNTER — Other Ambulatory Visit: Payer: Self-pay

## 2022-05-02 ENCOUNTER — Ambulatory Visit (INDEPENDENT_AMBULATORY_CARE_PROVIDER_SITE_OTHER): Payer: BC Managed Care – PPO | Admitting: Family Medicine

## 2022-05-02 VITALS — BP 121/83 | HR 124 | Ht 60.0 in | Wt 184.0 lb

## 2022-05-02 DIAGNOSIS — J4551 Severe persistent asthma with (acute) exacerbation: Secondary | ICD-10-CM

## 2022-05-02 DIAGNOSIS — R55 Syncope and collapse: Secondary | ICD-10-CM | POA: Diagnosis not present

## 2022-05-02 DIAGNOSIS — R9389 Abnormal findings on diagnostic imaging of other specified body structures: Secondary | ICD-10-CM | POA: Diagnosis not present

## 2022-05-02 DIAGNOSIS — I10 Essential (primary) hypertension: Secondary | ICD-10-CM | POA: Diagnosis not present

## 2022-05-02 DIAGNOSIS — J45901 Unspecified asthma with (acute) exacerbation: Secondary | ICD-10-CM

## 2022-05-02 DIAGNOSIS — Z5941 Food insecurity: Secondary | ICD-10-CM

## 2022-05-02 DIAGNOSIS — Z7689 Persons encountering health services in other specified circumstances: Secondary | ICD-10-CM

## 2022-05-02 DIAGNOSIS — Z09 Encounter for follow-up examination after completed treatment for conditions other than malignant neoplasm: Secondary | ICD-10-CM

## 2022-05-02 DIAGNOSIS — G43909 Migraine, unspecified, not intractable, without status migrainosus: Secondary | ICD-10-CM

## 2022-05-02 MED ORDER — METHYLPREDNISOLONE ACETATE 80 MG/ML IJ SUSP
80.0000 mg | Freq: Once | INTRAMUSCULAR | Status: AC
  Administered 2022-05-02: 80 mg via INTRAMUSCULAR

## 2022-05-02 MED ORDER — AZITHROMYCIN 250 MG PO TABS: ORAL_TABLET | ORAL | 0 refills | Status: AC

## 2022-05-02 MED ORDER — CEFTRIAXONE SODIUM 500 MG IJ SOLR
500.0000 mg | Freq: Once | INTRAMUSCULAR | Status: AC
  Administered 2022-05-02: 500 mg via INTRAMUSCULAR

## 2022-05-02 MED ORDER — FLUTICASONE PROPIONATE 50 MCG/ACT NA SUSP: 2.0000 | Freq: Every day | NASAL | 6 refills | Status: DC

## 2022-05-02 MED ORDER — PREDNISONE 10 MG (21) PO TBPK: ORAL_TABLET | ORAL | 0 refills | Status: DC

## 2022-05-02 MED ORDER — FLUTICASONE-SALMETEROL 500-50 MCG/ACT IN AEPB: 1.0000 | INHALATION_SPRAY | Freq: Two times a day (BID) | RESPIRATORY_TRACT | 3 refills | Status: DC

## 2022-05-02 NOTE — Patient Instructions (Addendum)
F/u in 10 days, re evaluation  Rocephin 500 mgIM  in office today and depo medrol 80 mg iM in office today  You need to fill and start using the advair inhaler which was already prescribed EVERY day do wheezing is controlled. I have also prescribed an additional 6 day course of prednisone and Azithromycin, also flonase  Work excuse for 2 weeks starting today Return 05/15/2022  I will refer you to Pulmonary Specialist as well

## 2022-05-02 NOTE — Patient Outreach (Signed)
  Care Coordination [facility] Note Transition Care Management Follow-up Telephone Call Date of discharge and from where: 04/28/22 from [facility] ED How have you been since you were released from the hospital? Better but still not feeling well Any questions or concerns? No  Items Reviewed: Did the pt receive and understand the discharge instructions provided? Yes  Medications obtained and verified? Yes  Other? Yes . Reviewed Dr Griffin Dakin office notes from today along with discharge instructions. Discussed referral to pulmonologist and medications. Encouraged to rest but to get up and walk a little at least every 2 hours. Encouraged deep breathing exercises. Referral placed for food insecurity and difficulty with paying for utilities.  Any new allergies since your discharge? No  Dietary orders reviewed? Yes Do you have support at home? Yes   Home Care and Equipment/Supplies: Were home health services ordered? no If so, what is the name of the agency?   Has the agency set up a time to come to the patient's home? not applicable Were any new equipment or medical supplies ordered?  No What is the name of the medical supply agency?  Were you able to get the supplies/equipment? not applicable Do you have any questions related to the use of the equipment or supplies? No  Functional Questionnaire: (I = Independent and D = Dependent) ADLs: I  Bathing/Dressing- I  Meal Prep- I  Eating- I  Maintaining continence- I  Transferring/Ambulation- I  Managing Meds- I  Follow up appointments reviewed:  PCP Hospital f/u appt confirmed? Yes  Saw Dr Moshe Cipro today [facility] f/u appt confirmed? No  pulmonary referral pending Are transportation arrangements needed? No  If their condition worsens, is the pt aware to call PCP or go to the Emergency Dept.? Yes Was the patient provided with contact information for the PCP's office or ED? Yes Was to pt encouraged to call back with questions or  concerns? Yes  SDOH assessments and interventions completed:   Yes SDOH Interventions Today    Flowsheet Row Most Recent Value  SDOH Interventions   Food Insecurity Interventions AMB Referral  Housing Interventions Intervention Not Indicated  Transportation Interventions Intervention Not Indicated  Utilities Interventions AMB Referral  Financial Strain Interventions Intervention Not Indicated       Care Coordination Interventions:  Referred for Care Coordination Services:  [place] assistance with resources    Encounter Outcome:  Pt. Visit Completed    Chong Sicilian, BSN, RN-BC RN Care Coordinator [place]: [phone] Main #: [phone]

## 2022-05-03 ENCOUNTER — Telehealth: Payer: Self-pay

## 2022-05-03 NOTE — Telephone Encounter (Signed)
   Telephone encounter was:  Successful.  05/03/2022 Name: BRETTANY SYDNEY MRN: [id number] DOB: 10-03-1975  [name] is a 47 y.o. year old female who is a primary care patient of Moshe Cipro Norwood Levo, MD . The community resource team was consulted for assistance with Food Insecurity and Financial Difficulties related to [place] guide performed the following interventions: Patient provided with information about care guide support team and interviewed to confirm resource needs.Patient has been sick and out of work causing financial strain. Patient cant afford to buy food and pay utilities. I am mailing resources and adding a referral in [place] [facility]  Follow Up Plan:  No further follow up planned at this time. The patient has been provided with needed resources.    [place], Care Management  [phone] [address], [place], [place] [address] Phone: [phone] Email: [email or web address]

## 2022-05-04 ENCOUNTER — Encounter: Payer: Self-pay | Admitting: Family Medicine

## 2022-05-04 MED ORDER — GUAIFENESIN-DM 100-10 MG/5ML PO SYRP: 10.0000 mL | ORAL_SOLUTION | Freq: Three times a day (TID) | ORAL | 1 refills | Status: DC

## 2022-05-04 MED ORDER — GUAIFENESIN-DM 100-10 MG/5ML PO SYRP: 5.0000 mL | ORAL_SOLUTION | ORAL | 0 refills | Status: DC | PRN

## 2022-05-05 ENCOUNTER — Encounter: Payer: Self-pay | Admitting: Family Medicine

## 2022-05-07 ENCOUNTER — Encounter: Payer: Self-pay | Admitting: Family Medicine

## 2022-05-07 DIAGNOSIS — R9389 Abnormal findings on diagnostic imaging of other specified body structures: Secondary | ICD-10-CM | POA: Insufficient documentation

## 2022-05-07 DIAGNOSIS — Z7689 Persons encountering health services in other specified circumstances: Secondary | ICD-10-CM | POA: Insufficient documentation

## 2022-05-07 NOTE — Assessment & Plan Note (Signed)
Controlled, no change in medication Managed by Neurology

## 2022-05-07 NOTE — Progress Notes (Signed)
   Rebecca Boyd     MRN: [id number]      DOB: 26-Jul-1975   HPI [name] is here for follow up of hospitalization from 12/28 to 04/28/2022 dx with exacerbation of asthma, also had a witnessed episode of syncope in the ED, still having shortness of breath and fatigue, improving slowly  ROS Denies recent fever or chills. Denies sinus pressure, nasal congestion, ear pain or sore throat. Denies chest pains, palpitations and leg swelling Denies abdominal pain, nausea, vomiting,diarrhea or constipation.   Denies dysuria, frequency, hesitancy or incontinence. Denies joint pain, swelling and limitation in mobility. Denies skin break down or rash.   PE  BP 121/83 (BP Location: Right Arm, Patient Position: Sitting, Cuff Size: Large)   Pulse (!) 124   Ht 5' (1.524 m)   Wt 184 lb 0.6 oz (83.5 kg)   SpO2 93%   BMI 35.94 kg/m   Patient alert and oriented and in mild  cardiopulmonary distress.  HEENT: No facial asymmetry, EOMI,     Neck supple .  Chest: decreased air entry bilateral wheezes.  CVS: S1, S2 no murmurs, no S3.Regular rate.  ABD: Soft non tender.   Ext: No edema  MS: Adequate ROM spine, shoulders, hips and knees.  Skin: Intact, no ulcerations or rash noted.  Psych: Good eye contact, normal affect. Memory intact not anxious or depressed appearing.  CNS: CN 2-12 intact, power,  normal throughout.no focal deficits noted.   Assessment & Plan   Asthma exacerbation Imroving but still symptomatic, extend work excuse to return 05/15/2022. Refer pulmonary Pt to start advair and additional prednisone precribed  Abnormal CXR Abnormality note in 08/2021 slightly improved, recommend chest scan and pulmonary follow up Persitenmt asthma flare , azithromycin prescribed and additional prednisone  Essential hypertension Controlled, no change in medication   Syncope None since hospitalization negative cardiac  work up  Migraine syndrome Controlled, no change in  medication Managed by Neurology  Encounter for support and coordination of transition of care Patient in for follow up of recent hospitalization. Discharge summary, and laboratory and radiology data are reviewed, and any questions or concerns  are discussed. Specific issues requiring follow up are specifically addressed.   Hospital discharge follow-up Patient in for follow up of recent hospitalization. Discharge summary, and laboratory and radiology data are reviewed, and any questions or concerns  are discussed. Specific issues requiring follow up are specifically addressed.

## 2022-05-07 NOTE — Assessment & Plan Note (Signed)
Controlled, no change in medication  

## 2022-05-07 NOTE — Assessment & Plan Note (Addendum)
Abnormality note in 08/2021 slightly improved, recommend chest scan and pulmonary follow up Persitenmt asthma flare , azithromycin prescribed and additional prednisone

## 2022-05-07 NOTE — Assessment & Plan Note (Signed)
None since hospitalization negative cardiac  work up

## 2022-05-07 NOTE — Assessment & Plan Note (Signed)
Patient in for follow up of recent hospitalization. Discharge summary, and laboratory and radiology data are reviewed, and any questions or concerns  are discussed. Specific issues requiring follow up are specifically addressed.  

## 2022-05-07 NOTE — Assessment & Plan Note (Signed)
Imroving but still symptomatic, extend work excuse to return 05/15/2022. Refer pulmonary Pt to start advair and additional prednisone precribed

## 2022-05-08 ENCOUNTER — Ambulatory Visit: Payer: BC Managed Care – PPO | Admitting: Urology

## 2022-05-08 DIAGNOSIS — R350 Frequency of micturition: Secondary | ICD-10-CM

## 2022-05-08 DIAGNOSIS — Z09 Encounter for follow-up examination after completed treatment for conditions other than malignant neoplasm: Secondary | ICD-10-CM | POA: Insufficient documentation

## 2022-05-08 NOTE — Addendum Note (Signed)
Addended by: Fayrene Helper on: 05/08/2022 08:33 PM   Modules accepted: Level of Service

## 2022-05-08 NOTE — Assessment & Plan Note (Signed)
Patient in for follow up of recent hospitalization. Discharge summary, and laboratory and radiology data are reviewed, and any questions or concerns  are discussed. Specific issues requiring follow up are specifically addressed.  

## 2022-05-09 ENCOUNTER — Encounter: Payer: Self-pay | Admitting: Family Medicine

## 2022-05-10 ENCOUNTER — Telehealth: Payer: Self-pay | Admitting: Family Medicine

## 2022-05-10 NOTE — Telephone Encounter (Signed)
Short term disability forms   Noted  Copied  Sleeved   In providers box Copy in brown folder  Forms came back from scan incomplete

## 2022-05-12 ENCOUNTER — Encounter: Payer: Self-pay | Admitting: Family Medicine

## 2022-05-12 ENCOUNTER — Ambulatory Visit: Payer: BC Managed Care – PPO | Admitting: Family Medicine

## 2022-05-12 NOTE — Telephone Encounter (Signed)
Patient will collect note at appt on 05/16/22 she is aware.

## 2022-05-16 ENCOUNTER — Encounter: Payer: Self-pay | Admitting: Family Medicine

## 2022-05-16 ENCOUNTER — Ambulatory Visit (INDEPENDENT_AMBULATORY_CARE_PROVIDER_SITE_OTHER): Payer: BC Managed Care – PPO | Admitting: Family Medicine

## 2022-05-16 VITALS — BP 124/82 | HR 89 | Ht 60.0 in | Wt 185.0 lb

## 2022-05-16 DIAGNOSIS — R9389 Abnormal findings on diagnostic imaging of other specified body structures: Secondary | ICD-10-CM

## 2022-05-16 DIAGNOSIS — Z23 Encounter for immunization: Secondary | ICD-10-CM

## 2022-05-16 DIAGNOSIS — E785 Hyperlipidemia, unspecified: Secondary | ICD-10-CM | POA: Diagnosis not present

## 2022-05-16 DIAGNOSIS — I10 Essential (primary) hypertension: Secondary | ICD-10-CM | POA: Diagnosis not present

## 2022-05-16 DIAGNOSIS — R7303 Prediabetes: Secondary | ICD-10-CM

## 2022-05-16 DIAGNOSIS — J4551 Severe persistent asthma with (acute) exacerbation: Secondary | ICD-10-CM

## 2022-05-16 MED ORDER — ACYCLOVIR 400 MG PO TABS: 400.0000 mg | ORAL_TABLET | Freq: Three times a day (TID) | ORAL | 1 refills | Status: DC

## 2022-05-16 MED ORDER — PHENTERMINE HCL 15 MG PO CAPS: 15.0000 mg | ORAL_CAPSULE | ORAL | 2 refills | Status: DC

## 2022-05-16 NOTE — Patient Instructions (Addendum)
F/u in 10 weeks, call if you need me sooner  Weight loss goal of 10 pounds, food choice is crucial and eating on a schedule  Fasting lipid, , chem 7 an dEGFr and HBa1C 3 to 5 days before next appt  Work note to return 05/16/2022, satrt 05/02/2022 to be given today  New med to help curb appetite and therefore weight loss, phentermine capsule   Acyclovir is prescribed for rash  It is important that you exercise regularly at least 30 minutes 5 times a week. If you develop chest pain, have severe difficulty breathing, or feel very tired, stop exercising immediately and seek medical attention  Thanks for choosing [facility], we consider it a privelige to serve you.

## 2022-05-21 ENCOUNTER — Encounter: Payer: Self-pay | Admitting: Family Medicine

## 2022-05-21 NOTE — Assessment & Plan Note (Addendum)
Poorly controlled , refer Pulmonary, also had severe episode with syncope, requiring hospitalization

## 2022-05-21 NOTE — Assessment & Plan Note (Signed)
.  cpon1 DASH diet and commitment to daily physical activity for a minimum of 30 minutes discussed and encouraged, as a part of hypertension management. The importance of attaining a healthy weight is also discussed.     05/16/2022    8:56 AM 05/16/2022    8:19 AM 05/16/2022    8:13 AM 05/02/2022   11:09 AM 04/28/2022   11:00 AM 04/28/2022   10:30 AM 04/28/2022   10:15 AM  BP/Weight  Systolic BP 818 403 754 360 677 034 035  Diastolic BP 82 86 87 83 76 76 86  Wt. (Lbs)   185 184.04     BMI   36.13 kg/m2 35.94 kg/m2

## 2022-05-21 NOTE — Assessment & Plan Note (Signed)
  Patient re-educated about  the importance of commitment to a  minimum of 150 minutes of exercise per week as able.  The importance of healthy food choices with portion control discussed, as well as eating regularly and within a 12 hour window most days. The need to choose "clean , green" food 50 to 75% of the time is discussed, as well as to make water the primary drink and set a goal of 64 ounces water daily.       05/16/2022    8:13 AM 05/02/2022   11:09 AM 04/27/2022    4:08 PM  Weight /BMI  Weight 185 lb 184 lb 0.6 oz 185 lb  Height 5' (1.524 m) 5' (1.524 m) 5' (1.524 m)  BMI 36.13 kg/m2 35.94 kg/m2 36.13 kg/m2    Start phentermine daily

## 2022-05-21 NOTE — Progress Notes (Signed)
   Rebecca Boyd     MRN: [id number]      DOB: 1975-08-22   HPI Rebecca Boyd is here for follow up and re-evaluation before returning to work following hospitalization for severe asthma exacerbation with syncope. Treated for pneumonia with abnormal CXR, follow up chest scan is scheduled for 05/25/2022.states breathing is much better , denies fever , chills or sputum production    ROS Denies chest pains, palpitations and leg swelling Denies abdominal pain, nausea, vomiting,diarrhea or constipation.   Denies dysuria, frequency, hesitancy or incontinence. Chronic  joint pain, swelling and limitation in mobility.States  she has " lost " name of Pain clinic referred to Denies uncontrolled  headaches, seizures, numbness, or tingling. Denies depression, anxiety or insomnia. Denies skin break down or rash.   PE  BP 124/82   Pulse 89   Ht 5' (1.524 m)   Wt 185 lb (83.9 kg)   SpO2 98%   BMI 36.13 kg/m   Patient alert and oriented and in no cardiopulmonary distress.  HEENT: No facial asymmetry, EOMI,     Neck supple .  Chest: Clear to auscultation bilaterally.  CVS: S1, S2 no murmurs, no S3.Regular rate.  ABD: Soft non tender.   Ext: No edema  MS: Adequate ROM spine, shoulders, hips and knees.  Skin: Intact, no ulcerations or rash noted.  Psych: Good eye contact, normal affect. Memory intact not anxious or depressed appearing.  CNS: CN 2-12 intact, power,  normal throughout.no focal deficits noted.   Assessment & Plan  Essential hypertension .cpon1 DASH diet and commitment to daily physical activity for a minimum of 30 minutes discussed and encouraged, as a part of hypertension management. The importance of attaining a healthy weight is also discussed.     05/16/2022    8:56 AM 05/16/2022    8:19 AM 05/16/2022    8:13 AM 05/02/2022   11:09 AM 04/28/2022   11:00 AM 04/28/2022   10:30 AM 04/28/2022   10:15 AM  BP/Weight  Systolic BP 010 272 536 644 034 742 595  Diastolic  BP 82 86 87 83 76 76 86  Wt. (Lbs)   185 184.04     BMI   36.13 kg/m2 35.94 kg/m2          Asthma exacerbation Resolved , may return to work on 05/16/2022  Asthma Poorly controlled , refer Pulmonary, also had severe episode with syncope, requiring hospitalization  Abnormal CXR Chest scan scheduled or f/u  Morbid obesity due to excess calories [facility])  Patient re-educated about  the importance of commitment to a  minimum of 150 minutes of exercise per week as able.  The importance of healthy food choices with portion control discussed, as well as eating regularly and within a 12 hour window most days. The need to choose "clean , green" food 50 to 75% of the time is discussed, as well as to make water the primary drink and set a goal of 64 ounces water daily.       05/16/2022    8:13 AM 05/02/2022   11:09 AM 04/27/2022    4:08 PM  Weight /BMI  Weight 185 lb 184 lb 0.6 oz 185 lb  Height 5' (1.524 m) 5' (1.524 m) 5' (1.524 m)  BMI 36.13 kg/m2 35.94 kg/m2 36.13 kg/m2    Start phentermine daily

## 2022-05-21 NOTE — Assessment & Plan Note (Signed)
Resolved , may return to work on 05/16/2022

## 2022-05-21 NOTE — Assessment & Plan Note (Signed)
Chest scan scheduled or f/u

## 2022-05-23 ENCOUNTER — Encounter: Payer: Self-pay | Admitting: Family Medicine

## 2022-05-23 ENCOUNTER — Other Ambulatory Visit: Payer: Self-pay

## 2022-05-23 MED ORDER — METRONIDAZOLE 0.75 % VA GEL: 1.0000 | Freq: Two times a day (BID) | VAGINAL | 0 refills | Status: AC

## 2022-05-24 DIAGNOSIS — Z0279 Encounter for issue of other medical certificate: Secondary | ICD-10-CM

## 2022-05-25 ENCOUNTER — Ambulatory Visit (HOSPITAL_COMMUNITY)
Admission: RE | Admit: 2022-05-25 | Discharge: 2022-05-25 | Disposition: A | Discharge status: Home / Self Care | Payer: BC Managed Care – PPO | Source: Ambulatory Visit | Attending: Family Medicine | Admitting: Family Medicine

## 2022-05-25 DIAGNOSIS — R9389 Abnormal findings on diagnostic imaging of other specified body structures: Secondary | ICD-10-CM | POA: Insufficient documentation

## 2022-05-25 DIAGNOSIS — R918 Other nonspecific abnormal finding of lung field: Secondary | ICD-10-CM | POA: Diagnosis not present

## 2022-05-30 ENCOUNTER — Ambulatory Visit: Payer: BC Managed Care – PPO | Admitting: Urology

## 2022-05-30 ENCOUNTER — Ambulatory Visit (HOSPITAL_COMMUNITY): Payer: BC Managed Care – PPO | Admitting: Psychiatry

## 2022-05-30 DIAGNOSIS — R350 Frequency of micturition: Secondary | ICD-10-CM

## 2022-06-05 ENCOUNTER — Telehealth: Payer: Self-pay | Admitting: Family Medicine

## 2022-06-05 NOTE — Telephone Encounter (Signed)
Pt called stating she thought she was referred to a pain management in [place]. She does not know who it was for. Can you please resend referral & let patient know?

## 2022-06-06 ENCOUNTER — Other Ambulatory Visit: Payer: Self-pay

## 2022-06-06 DIAGNOSIS — M549 Dorsalgia, unspecified: Secondary | ICD-10-CM

## 2022-06-06 NOTE — Telephone Encounter (Signed)
Referral placed.

## 2022-06-08 ENCOUNTER — Institutional Professional Consult (permissible substitution) (HOSPITAL_BASED_OUTPATIENT_CLINIC_OR_DEPARTMENT_OTHER): Payer: BC Managed Care – PPO | Admitting: Pulmonary Disease

## 2022-06-15 ENCOUNTER — Ambulatory Visit (INDEPENDENT_AMBULATORY_CARE_PROVIDER_SITE_OTHER): Payer: BC Managed Care – PPO | Admitting: Family Medicine

## 2022-06-15 ENCOUNTER — Encounter: Payer: Self-pay | Admitting: Family Medicine

## 2022-06-15 VITALS — BP 126/84 | HR 83 | Resp 16 | Ht 60.0 in | Wt 187.0 lb

## 2022-06-15 DIAGNOSIS — R222 Localized swelling, mass and lump, trunk: Secondary | ICD-10-CM | POA: Diagnosis not present

## 2022-06-15 DIAGNOSIS — I10 Essential (primary) hypertension: Secondary | ICD-10-CM | POA: Diagnosis not present

## 2022-06-15 DIAGNOSIS — N912 Amenorrhea, unspecified: Secondary | ICD-10-CM | POA: Diagnosis not present

## 2022-06-15 DIAGNOSIS — R1084 Generalized abdominal pain: Secondary | ICD-10-CM

## 2022-06-15 LAB — POCT URINALYSIS DIP (CLINITEK)
Bilirubin, UA: NEGATIVE
Blood, UA: NEGATIVE
Glucose, UA: NEGATIVE mg/dL
Ketones, POC UA: NEGATIVE mg/dL
Leukocytes, UA: NEGATIVE
Nitrite, UA: NEGATIVE
POC PROTEIN,UA: NEGATIVE
Spec Grav, UA: 1.015 (ref 1.010–1.025)
Urobilinogen, UA: 0.2 E.U./dL
pH, UA: 6.5 (ref 5.0–8.0)

## 2022-06-15 LAB — POCT URINE PREGNANCY: Preg Test, Ur: NEGATIVE

## 2022-06-15 NOTE — Patient Instructions (Addendum)
F/U in 4 months, cvall if you need me sooenr   You are referred to Dr Abbey Chatters re abdominal pain  CT abdomen and pelvis will be ordered  Pls add on blood test  to test for H pylori, to labs ordered she is coming after 1 PM  Thanks for choosing [facility], we consider it a privelige to serve you.  It is important that you exercise regularly at least 30 minutes 5 times a week. If you develop chest pain, have severe difficulty breathing, or feel very tired, stop exercising immediately and seek medical attention  Think about what you will eat, plan ahead. Choose " clean, green, fresh or frozen" over canned, processed or packaged foods which are more sugary, salty and fatty. 70 to 75% of food eaten should be vegetables and fruit. Three meals at set times with snacks allowed between meals, but they must be fruit or vegetables. Aim to eat over a 12 hour period , example 7 am to 7 pm, and STOP after  your last meal of the day. Drink water,generally about 64 ounces per day, no other drink is as healthy. Fruit juice is best enjoyed in a healthy way, by EATING the fruit.

## 2022-06-15 NOTE — Progress Notes (Signed)
Rebecca Boyd     MRN: [id number]      DOB: [birth year]   HPI Rebecca Boyd is here for follow up and re-evaluation of chronic medical conditions, medication management and review of any available recent lab and radiology data.  Preventive health is updated, specifically  Cancer screening and Immunization.   Questions or concerns regarding consultations or procedures which the PT has had in the interim are  addressed. The PT denies any adverse reactions to current medications since the last visit.  2 week h/o epigastric pain , rated at 7 and RUQ and RLQ, has awakened pt Nodule on right hip, noted x 3 weeks ROS Denies recent fever or chills. Denies sinus pressure, nasal congestion, ear pain or sore throat. Denies chest congestion, productive cough or wheezing. Denies chest pains, palpitations and leg swelling Chronic joint pain, swelling and limitation in mobility. Denies headaches, seizures, numbness, or tingling. Denies uncontrolled depression, anxiety or insomnia. Denies skin break down or rash.   PE  BP 137/86   Pulse 83   Resp 16   Ht 5' (1.524 m)   Wt 187 lb (84.8 kg)   LMP 03/28/2022 (Approximate)   SpO2 97%   BMI 36.52 kg/m   Patient alert and oriented and in no cardiopulmonary distress.  HEENT: No facial asymmetry, EOMI,     Neck supple .  Chest: Clear to auscultation bilaterally.  CVS: S1, S2 no murmurs, no S3.Regular rate. Epigastric and RUQ tenderness to superficial palpation, no organomegaly or mass , RLQ tenderness, palpable.No guarding or rebound   Ext: No edema  MS: Adequate ROM spine, shoulders, hips and knees.  Skin: Intact, no ulcerations or rash noted.nodule / lipoma on right buttock  Psych: Good eye contact, normal affect. Memory intact not anxious or depressed appearing.  CNS: CN 2-12 intact, power,  normal throughout.no focal deficits noted.   Assessment & Plan  Amenorrhea No cycle in 8 weeks, urine preg test in office  negaive  Essential hypertension DASH diet and commitment to daily physical activity for a minimum of 30 minutes discussed and encouraged, as a part of hypertension management. The importance of attaining a healthy weight is also discussed.     06/15/2022   11:56 AM 06/15/2022   11:20 AM 05/16/2022    8:56 AM 05/16/2022    8:19 AM 05/16/2022    8:13 AM 05/02/2022   11:09 AM 04/28/2022   11:00 AM  BP/Weight  Systolic BP 123XX123 0000000 [id number] [id number] 123XX123 A999333  Diastolic BP 84 86 82 86 87 83 76  Wt. (Lbs)  187   185 184.04   BMI  36.52 kg/m2   36.13 kg/m2 35.94 kg/m2      Controlled, no change in medication   Nodule of buttock Reassuring exam, max diameter is 2 cm, no erythema or tenderness, will observe, no need for surgical eval at this me, pt to call back if very  anxious  Morbid obesity due to excess calories [facility])  Patient re-educated about  the importance of commitment to a  minimum of 150 minutes of exercise per week as able.  The importance of healthy food choices with portion control discussed, as well as eating regularly and within a 12 hour window most days. The need to choose "clean , green" food 50 to 75% of the time is discussed, as well as to make water the primary drink and set a goal of 64 ounces water daily.       06/15/2022  11:20 AM 05/16/2022    8:13 AM 05/02/2022   11:09 AM  Weight /BMI  Weight 187 lb 185 lb 184 lb 0.6 oz  Height 5' (1.524 m) 5' (1.524 m) 5' (1.524 m)  BMI 36.52 kg/m2 36.13 kg/m2 35.94 kg/m2

## 2022-06-19 ENCOUNTER — Encounter: Payer: Self-pay | Admitting: Family Medicine

## 2022-06-19 DIAGNOSIS — R222 Localized swelling, mass and lump, trunk: Secondary | ICD-10-CM | POA: Insufficient documentation

## 2022-06-19 NOTE — Assessment & Plan Note (Signed)
No cycle in 8 weeks, urine preg test in office negaive

## 2022-06-19 NOTE — Assessment & Plan Note (Signed)
Reassuring exam, max diameter is 2 cm, no erythema or tenderness, will observe, no need for surgical eval at this me, pt to call back if very  anxious

## 2022-06-19 NOTE — Assessment & Plan Note (Signed)
  Patient re-educated about  the importance of commitment to a  minimum of 150 minutes of exercise per week as able.  The importance of healthy food choices with portion control discussed, as well as eating regularly and within a 12 hour window most days. The need to choose "clean , green" food 50 to 75% of the time is discussed, as well as to make water the primary drink and set a goal of 64 ounces water daily.       06/15/2022   11:20 AM 05/16/2022    8:13 AM 05/02/2022   11:09 AM  Weight /BMI  Weight 187 lb 185 lb 184 lb 0.6 oz  Height 5' (1.524 m) 5' (1.524 m) 5' (1.524 m)  BMI 36.52 kg/m2 36.13 kg/m2 35.94 kg/m2

## 2022-06-19 NOTE — Assessment & Plan Note (Signed)
DASH diet and commitment to daily physical activity for a minimum of 30 minutes discussed and encouraged, as a part of hypertension management. The importance of attaining a healthy weight is also discussed.     06/15/2022   11:56 AM 06/15/2022   11:20 AM 05/16/2022    8:56 AM 05/16/2022    8:19 AM 05/16/2022    8:13 AM 05/02/2022   11:09 AM 04/28/2022   11:00 AM  BP/Weight  Systolic BP 123XX123 0000000 [id number] A999333 [id number] 123XX123 A999333  Diastolic BP 84 86 82 86 87 83 76  Wt. (Lbs)  187   185 184.04   BMI  36.52 kg/m2   36.13 kg/m2 35.94 kg/m2      Controlled, no change in medication

## 2022-06-26 ENCOUNTER — Ambulatory Visit (HOSPITAL_COMMUNITY)
Admission: RE | Admit: 2022-06-26 | Discharge: 2022-06-26 | Disposition: A | Discharge status: Home / Self Care | Payer: BC Managed Care – PPO | Source: Ambulatory Visit | Attending: Family Medicine | Admitting: Family Medicine

## 2022-06-26 DIAGNOSIS — R1084 Generalized abdominal pain: Secondary | ICD-10-CM | POA: Diagnosis not present

## 2022-06-26 DIAGNOSIS — R109 Unspecified abdominal pain: Secondary | ICD-10-CM | POA: Diagnosis not present

## 2022-06-26 DIAGNOSIS — N281 Cyst of kidney, acquired: Secondary | ICD-10-CM | POA: Diagnosis not present

## 2022-06-28 ENCOUNTER — Other Ambulatory Visit: Payer: Self-pay

## 2022-06-28 DIAGNOSIS — K429 Umbilical hernia without obstruction or gangrene: Secondary | ICD-10-CM

## 2022-06-28 LAB — H. PYLORI ANTIGEN, STOOL

## 2022-06-29 ENCOUNTER — Encounter: Payer: Self-pay | Admitting: Radiology

## 2022-07-03 NOTE — Progress Notes (Unsigned)
GI Office Note    Referring Provider: Fayrene Helper, MD Primary Care Physician:  Fayrene Helper, MD Primary Gastroenterologist: Elon Alas. Abbey Chatters, DO  Date:  07/04/2022  ID:  Rebecca Boyd, DOB 02-22-1976, MRN [id number]   Chief Complaint   Chief Complaint  Patient presents with   Abdominal Pain    Patient here today due to mid sternum and peri umbilical pain. She says she had a recent Ct scan that showed IMPRESSION: 1. No acute abnormality or explanation for abdominal pain. 2. Small fat containing umbilical hernia. 3. Suspected uterine fibroids.     History of Present Illness  Rebecca Boyd is a 47 y.o. female with a history of anemia, anxiety, depression, HTN, migraines presenting today with complaint of generalized abdominal pain.   Last office visit 02/18/21. Visit to discuss first-ever screening colonoscopy.  Reported maternal cousin with colon cancer age 15, maternal aunt with stage IV colon cancer diagnosed age 36.  Also noted to have a sister with breast cancer diagnosed in her 37s.  Doing well from a GI standpoint denies any bowel concerns.  No melena or BRBPR.  No abdominal pain, weight loss, vomiting, constipation, diarrhea, or heartburn.  Scheduled for colonoscopy.  Colonoscopy 05/10/21: -Non-bleeding internal hemorrhoids -4 polyps of the rectosigmoid colon (hyperplastic) -Repeat colonoscopy in 10 years  Office visit with PCP 06/15/2022.  Patient reported 2-week history of epigastric pain 7/10 and RUQ and RLQ pain that has occasionally awaken patient.  PCP ordered CT A/P as well as H. pylori blood test.  Advised on healthy eating plan with goal to lose weight.  Urine pregnancy test negative.  CT A/P 06/26/2022: -No acute abnormality to explain abdominal pain -Small fat-containing umbilical hernia -Suspected uterine fibroids -Small to moderate volume of stool in the colon without inflammation -Occasional diverticula without diverticulitis -Normal  pancreas, liver, and gallbladder  Mildly December 2023 with normal hemoglobin at 12.4.  Today:  Upper mid abdominal pain for about one month. Has been radiating into her chest at times. Repots she feels as though something is stuck there all the time. When she eats it makes it a little worse and feels very bloated.  Specific foods not identified. No burning at all. No dysphagia. Does get som nausea but not all the time. Has a good appetite. No unintentional weight loss. Has baseline shortness of breath with her asthma. Takes tylenol only but no NSAIDs. No longer smokes. Takes percocet at times related to her back pain.   No constipation or diarrhea. Sometimes she has had some black stools, random. Has noticed this within the last month.   Has migraines. Last none in 6 months.  No dizziness or lightheadedness.  Current Outpatient Medications  Medication Sig Dispense Refill   acyclovir (ZOVIRAX) 400 MG tablet Take 1 tablet (400 mg total) by mouth 3 (three) times daily. 21 tablet 1   albuterol (VENTOLIN HFA) 108 (90 Base) MCG/ACT inhaler Inhale 1-2 puffs into the lungs every 4 (four) hours as needed for wheezing or shortness of breath. 18 g 3   amLODipine (NORVASC) 10 MG tablet Take 1 tablet (10 mg total) by mouth daily. 90 tablet 2   betamethasone dipropionate 0.05 % cream Apply topically 2 (two) times daily. 45 g 0   busPIRone (BUSPAR) 5 MG tablet Take 1 tablet (5 mg total) by mouth 3 (three) times daily. 90 tablet 2   cetirizine (ZYRTEC ALLERGY) 10 MG tablet Take 1 tablet (10 mg total) by mouth daily for  14 days. 14 tablet 0   Erenumab-aooe (AIMOVIG) 70 MG/ML SOAJ Inject 70 mg into the skin every 30 (thirty) days. 1 pen 11   escitalopram (LEXAPRO) 10 MG tablet Take 1 tablet (10 mg total) by mouth daily. 30 tablet 2   fluticasone (FLONASE) 50 MCG/ACT nasal spray Place 2 sprays into both nostrils daily. 16 g 6   fluticasone-salmeterol (ADVAIR) 500-50 MCG/ACT AEPB Inhale 1 puff into the lungs in  the morning and at bedtime. 60 each 3   gabapentin (NEURONTIN) 300 MG capsule Take 1 capsule (300 mg total) by mouth at bedtime. 30 capsule 1   imipramine (TOFRANIL) 25 MG tablet Take 25 mg by mouth at bedtime.     montelukast (SINGULAIR) 10 MG tablet Take 1 tablet (10 mg total) by mouth at bedtime. 30 tablet 3   phentermine 15 MG capsule Take 1 capsule (15 mg total) by mouth every morning. 30 capsule 2   SUMAtriptan (IMITREX) 6 MG/0.5ML SOLN injection Use one injection at onset of migraine.  May repeat in 2 hrs, if needed.  Max dose: 2 inj/day. This is a 30 day prescription. 5 mL 11   topiramate (TOPAMAX) 100 MG tablet Take 1 tablet (100 mg total) by mouth 2 (two) times daily. 180 tablet 4   No current facility-administered medications for this visit.    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   Initial dx is 05/2013    Hypertension    Migraine    Migraines    Miscarriage 2009   Neck pain    Nondisplaced fracture of fifth metatarsal bone, left foot, subsequent encounter for fracture with routine healing 08/15/18 09/25/2018   Pruritus 09/29/2018   Pruritus ani 09/25/2018   Rash and nonspecific skin eruption 05/14/2017   Vaginitis    cyctitis    Past Surgical History:  Procedure Laterality Date   cervical cryotherapy N/A 1999   COLONOSCOPY WITH PROPOFOL N/A 05/10/2021   Procedure: COLONOSCOPY WITH PROPOFOL;  Surgeon: Eloise Harman, DO;  [facility];  Service: Endoscopy;  Laterality: N/A;  11:30am    DILITATION & CURRETTAGE/HYSTROSCOPY WITH THERMACHOICE ABLATION  08/26/2013   Procedure: DILATATION & CURETTAGE/HYSTEROSCOPY WITH THERMACHOICE ENDOMETRIAL ABLATION Procedure #2 Total Therapy Time=min       sec;  Surgeon: Jonnie Kind, MD;  Location: [facility];  Service: Gynecology;;   LAPAROSCOPIC BILATERAL SALPINGECTOMY Bilateral 08/26/2013   Procedure: LAPAROSCOPIC BILATERAL SALPINGECTOMY AND REMOVAL OF LEFT PERITUBAL CYST Procedure #1;  Surgeon: Jonnie Kind, MD;  Location: [facility];  Service: Gynecology;  Laterality: Bilateral;   LAPAROSCOPIC LYSIS OF ADHESIONS  08/26/2013   Procedure: LAPAROSCOPIC LYSIS OF ADHESIONS Procedure #1;  Surgeon: Jonnie Kind, MD;  Location: [facility];  Service: Gynecology;;   POLYPECTOMY  05/10/2021   Procedure: POLYPECTOMY;  Surgeon: Eloise Harman, DO;  [facility];  Service: Endoscopy;;   VULVAR LESION REMOVAL  08/26/2013   Procedure: REMOVAL  OF VULVAR SEBACEOUS CYST Procedure #3;  Surgeon: Jonnie Kind, MD;  Location: [facility];  Service: Gynecology;;    Family History  Problem Relation Age of Onset   Depression Mother    Drug abuse Mother    Hypertension Father    Breast cancer Sister        dx 73   Hypertension Maternal Grandmother    Hypertension Maternal Grandfather    Lung cancer Maternal Grandfather    Hypertension Paternal Grandmother    Breast cancer Paternal Grandmother        dx late 45s   Colon cancer Maternal Aunt        62   Colon cancer Cousin 21       maternal    Allergies as of 07/04/2022 - Review Complete 07/04/2022  Allergen Reaction Noted   Diphenhydramine hcl Anaphylaxis 07/04/2007   Orange fruit Hives, Shortness Of Breath, Itching, and Other (See Comments) 11/24/2010   Latex Rash 11/03/2013    Social History   Socioeconomic History   Marital status: Single    Spouse name: Not on file   Number of children: Not on file   Years of education: Not on file   Highest education level: Not on file  Occupational  History   Not on file  Tobacco Use   Smoking status: Former    Packs/day: 0.00    Years: 1.00    Total pack years: 0.00    Types: Cigars, Cigarettes    Quit date: 01/05/2017    Years since quitting: 5.4   Smokeless tobacco: Never  Vaping Use   Vaping Use: Never used  Substance and Sexual Activity   Alcohol use: Yes    Comment: occasional   Drug use: No   Sexual activity: Yes    Birth control/protection: Condom  Other Topics Concern   Not on file  Social History Narrative   Lives home with mother.  Works at [employer].  Education 10th grade/GED.  No children.  Single.     Social Determinants of Health   Financial Resource Strain: High Risk (05/03/2022)   Overall Financial Resource Strain (CARDIA)    Difficulty of Paying Living Expenses: Very hard  Food Insecurity: Food Insecurity Present (05/03/2022)   Hunger Vital Sign    Worried About Running Out of Food in the Last Year: Often true    Ran Out of Food in the Last Year: Often true  Transportation Needs: No Transportation Needs (05/02/2022)   PRAPARE - Hydrologist (Medical): No    Lack of Transportation (Non-Medical): No  Physical Activity: Not on file  Stress: Not on file  Social Connections: Not on file     Review of Systems   Gen: Denies fever, chills, anorexia. Denies fatigue, weakness, weight loss.  CV: Denies chest pain, palpitations, syncope, peripheral edema, and claudication. Resp: Denies dyspnea at rest, cough, wheezing, coughing up blood, and pleurisy. GI: See HPI Derm: Denies rash, itching, dry skin Psych: Denies depression, anxiety, memory loss, confusion. No homicidal or suicidal ideation.  Heme: Denies bruising, bleeding, and enlarged lymph nodes.   Physical Exam   BP 122/87 (BP Location: Left Arm, Patient Position: Sitting, Cuff Size: Large)   Pulse 85   Temp (!) 97.3 F (36.3 C) (Temporal)   Ht 5' (1.524 m)   Wt 189 lb 8 oz (86 kg)   LMP 03/28/2022  (Approximate)   BMI 37.01 kg/m   General:   Alert and oriented.  No distress noted. Pleasant and cooperative.  Head:  Normocephalic and atraumatic. Eyes:  Conjuctiva clear without scleral icterus. Mouth:  Oral mucosa pink and moist. Good dentition. No lesions. Lungs:  Clear to auscultation bilaterally. No wheezes, rales, or rhonchi. No distress.  Heart:  S1, S2 present without murmurs appreciated.  Abdomen:  +BS, soft, non-distended.  TTP to left upper quadrant and epigastrium.  Mild TTP to lower abdomen.  No rebound or guarding. No HSM or masses noted. Rectal: deferred Msk:  Symmetrical without gross deformities. Normal posture. Extremities:  Without edema. Neurologic:  Alert and  oriented x4 Psych:  Alert and cooperative. Normal mood and affect.   Assessment  Rebecca Boyd is a 47 y.o. female with a history of anemia, anxiety, depression, HTN, migraines presenting today with abdominal pain.   Abdominal pain, epigastric/melena: Symptoms present for approximately 1 month.  Having epigastric discomfort and left upper quadrant discomfort that at times radiates into her chest.  Denies any typical reflux symptoms but has noted a little bit of nausea.  Reports a good appetite.  No vomiting.  Denies any NSAID use or alcohol use.  Denies any BRBPR but has reported some episodes of melena since her pain began.  Eating tends to make her pain worse.  We will proceed with upper endoscopy for further evaluation of gastritis, duodenitis, esophagitis, peptic ulcer disease, or other.  She denies any dysphagia therefore no need for dilation.  She was started on pantoprazole 40 mg once daily.  Appears PCP attempted to test her for H. pylori which has not been completed.  Will likely obtain biopsies during EGD to assess for H. pylori.  PLAN   Proceed with upper endoscopy with propofol by Dr. Abbey Chatters in near future: the risks, benefits, and alternatives have been discussed with the patient in detail. The  patient states understanding and desires to proceed. ASA 2 UPT Start pantoprazole 40 mg once daily, 30 minutes prior to breakfast GERD diet societal modifications Follow-up in 2 to 3 months after procedure.  Sooner if needed.    Venetia Night, MSN, FNP-BC, AGACNP-BC [facility]

## 2022-07-03 NOTE — H&P (View-Only) (Signed)
 GI Office Note    Referring Provider: Simpson, Margaret E, MD Primary Care Physician:  Simpson, Margaret E, MD Primary Gastroenterologist: Charles K. Carver, DO  Date:  07/04/2022  ID:  Rebecca Boyd, DOB 11/19/1975, MRN [id number]   Chief Complaint   Chief Complaint  Patient presents with   Abdominal Pain    Patient here today due to mid sternum and peri umbilical pain. She says she had a recent Ct scan that showed IMPRESSION: 1. No acute abnormality or explanation for abdominal pain. 2. Small fat containing umbilical hernia. 3. Suspected uterine fibroids.     History of Present Illness  Rebecca Boyd is a 47 y.o. female with a history of anemia, anxiety, depression, HTN, migraines presenting today with complaint of generalized abdominal pain.   Last office visit 02/18/21. Visit to discuss first-ever screening colonoscopy.  Reported maternal cousin with colon cancer age 54, maternal aunt with stage IV colon cancer diagnosed age 62.  Also noted to have a sister with breast cancer diagnosed in her 30s.  Doing well from a GI standpoint denies any bowel concerns.  No melena or BRBPR.  No abdominal pain, weight loss, vomiting, constipation, diarrhea, or heartburn.  Scheduled for colonoscopy.  Colonoscopy 05/10/21: -Non-bleeding internal hemorrhoids -4 polyps of the rectosigmoid colon (hyperplastic) -Repeat colonoscopy in 10 years  Office visit with PCP 06/15/2022.  Patient reported 2-week history of epigastric pain 7/10 and RUQ and RLQ pain that has occasionally awaken patient.  PCP ordered CT A/P as well as H. pylori blood test.  Advised on healthy eating plan with goal to lose weight.  Urine pregnancy test negative.  CT A/P 06/26/2022: -No acute abnormality to explain abdominal pain -Small fat-containing umbilical hernia -Suspected uterine fibroids -Small to moderate volume of stool in the colon without inflammation -Occasional diverticula without diverticulitis -Normal  pancreas, liver, and gallbladder  Mildly December 2023 with normal hemoglobin at 12.4.  Today:  Upper mid abdominal pain for about one month. Has been radiating into her chest at times. Repots she feels as though something is stuck there all the time. When she eats it makes it a little worse and feels very bloated.  Specific foods not identified. No burning at all. No dysphagia. Does get som nausea but not all the time. Has a good appetite. No unintentional weight loss. Has baseline shortness of breath with her asthma. Takes tylenol only but no NSAIDs. No longer smokes. Takes percocet at times related to her back pain.   No constipation or diarrhea. Sometimes she has had some black stools, random. Has noticed this within the last month.   Has migraines. Last none in 6 months.  No dizziness or lightheadedness.  Current Outpatient Medications  Medication Sig Dispense Refill   acyclovir (ZOVIRAX) 400 MG tablet Take 1 tablet (400 mg total) by mouth 3 (three) times daily. 21 tablet 1   albuterol (VENTOLIN HFA) 108 (90 Base) MCG/ACT inhaler Inhale 1-2 puffs into the lungs every 4 (four) hours as needed for wheezing or shortness of breath. 18 g 3   amLODipine (NORVASC) 10 MG tablet Take 1 tablet (10 mg total) by mouth daily. 90 tablet 2   betamethasone dipropionate 0.05 % cream Apply topically 2 (two) times daily. 45 g 0   busPIRone (BUSPAR) 5 MG tablet Take 1 tablet (5 mg total) by mouth 3 (three) times daily. 90 tablet 2   cetirizine (ZYRTEC ALLERGY) 10 MG tablet Take 1 tablet (10 mg total) by mouth daily for   14 days. 14 tablet 0   Erenumab-aooe (AIMOVIG) 70 MG/ML SOAJ Inject 70 mg into the skin every 30 (thirty) days. 1 pen 11   escitalopram (LEXAPRO) 10 MG tablet Take 1 tablet (10 mg total) by mouth daily. 30 tablet 2   fluticasone (FLONASE) 50 MCG/ACT nasal spray Place 2 sprays into both nostrils daily. 16 g 6   fluticasone-salmeterol (ADVAIR) 500-50 MCG/ACT AEPB Inhale 1 puff into the lungs in  the morning and at bedtime. 60 each 3   gabapentin (NEURONTIN) 300 MG capsule Take 1 capsule (300 mg total) by mouth at bedtime. 30 capsule 1   imipramine (TOFRANIL) 25 MG tablet Take 25 mg by mouth at bedtime.     montelukast (SINGULAIR) 10 MG tablet Take 1 tablet (10 mg total) by mouth at bedtime. 30 tablet 3   phentermine 15 MG capsule Take 1 capsule (15 mg total) by mouth every morning. 30 capsule 2   SUMAtriptan (IMITREX) 6 MG/0.5ML SOLN injection Use one injection at onset of migraine.  May repeat in 2 hrs, if needed.  Max dose: 2 inj/day. This is a 30 day prescription. 5 mL 11   topiramate (TOPAMAX) 100 MG tablet Take 1 tablet (100 mg total) by mouth 2 (two) times daily. 180 tablet 4   No current facility-administered medications for this visit.    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   Initial dx is 05/2013    Hypertension    Migraine    Migraines    Miscarriage 2009   Neck pain    Nondisplaced fracture of fifth metatarsal bone, left foot, subsequent encounter for fracture with routine healing 08/15/18 09/25/2018   Pruritus 09/29/2018   Pruritus ani 09/25/2018   Rash and nonspecific skin eruption 05/14/2017   Vaginitis    cyctitis    Past Surgical History:  Procedure Laterality Date   cervical cryotherapy N/A 1999   COLONOSCOPY WITH PROPOFOL N/A 05/10/2021   Procedure: COLONOSCOPY WITH PROPOFOL;  Surgeon: Carver, Charles K, DO;  [facility];  Service: Endoscopy;  Laterality: N/A;  11:30am    DILITATION & CURRETTAGE/HYSTROSCOPY WITH THERMACHOICE ABLATION  08/26/2013   Procedure: DILATATION & CURETTAGE/HYSTEROSCOPY WITH THERMACHOICE ENDOMETRIAL ABLATION Procedure #2 Total Therapy Time=min       sec;  Surgeon: John V Ferguson, MD;  Location: [facility];  Service: Gynecology;;   LAPAROSCOPIC BILATERAL SALPINGECTOMY Bilateral 08/26/2013   Procedure: LAPAROSCOPIC BILATERAL SALPINGECTOMY AND REMOVAL OF LEFT PERITUBAL CYST Procedure #1;  Surgeon: John V Ferguson, MD;  Location: [facility];  Service: Gynecology;  Laterality: Bilateral;   LAPAROSCOPIC LYSIS OF ADHESIONS  08/26/2013   Procedure: LAPAROSCOPIC LYSIS OF ADHESIONS Procedure #1;  Surgeon: John V Ferguson, MD;  Location: [facility];  Service: Gynecology;;   POLYPECTOMY  05/10/2021   Procedure: POLYPECTOMY;  Surgeon: Carver, Charles K, DO;  [facility];  Service: Endoscopy;;   VULVAR LESION REMOVAL  08/26/2013   Procedure: REMOVAL   OF VULVAR SEBACEOUS CYST Procedure #3;  Surgeon: John V Ferguson, MD;  Location: [facility];  Service: Gynecology;;    Family History  Problem Relation Age of Onset   Depression Mother    Drug abuse Mother    Hypertension Father    Breast cancer Sister        dx 39   Hypertension Maternal Grandmother    Hypertension Maternal Grandfather    Lung cancer Maternal Grandfather    Hypertension Paternal Grandmother    Breast cancer Paternal Grandmother        dx late 70s   Colon cancer Maternal Aunt        62   Colon cancer Cousin 54       maternal    Allergies as of 07/04/2022 - Review Complete 07/04/2022  Allergen Reaction Noted   Diphenhydramine hcl Anaphylaxis 07/04/2007   Orange fruit Hives, Shortness Of Breath, Itching, and Other (See Comments) 11/24/2010   Latex Rash 11/03/2013    Social History   Socioeconomic History   Marital status: Single    Spouse name: Not on file   Number of children: Not on file   Years of education: Not on file   Highest education level: Not on file  Occupational  History   Not on file  Tobacco Use   Smoking status: Former    Packs/day: 0.00    Years: 1.00    Total pack years: 0.00    Types: Cigars, Cigarettes    Quit date: 01/05/2017    Years since quitting: 5.4   Smokeless tobacco: Never  Vaping Use   Vaping Use: Never used  Substance and Sexual Activity   Alcohol use: Yes    Comment: occasional   Drug use: No   Sexual activity: Yes    Birth control/protection: Condom  Other Topics Concern   Not on file  Social History Narrative   Lives home with mother.  Works at [employer].  Education 10th grade/GED.  No children.  Single.     Social Determinants of Health   Financial Resource Strain: High Risk (05/03/2022)   Overall Financial Resource Strain (CARDIA)    Difficulty of Paying Living Expenses: Very hard  Food Insecurity: Food Insecurity Present (05/03/2022)   Hunger Vital Sign    Worried About Running Out of Food in the Last Year: Often true    Ran Out of Food in the Last Year: Often true  Transportation Needs: No Transportation Needs (05/02/2022)   PRAPARE - Transportation    Lack of Transportation (Medical): No    Lack of Transportation (Non-Medical): No  Physical Activity: Not on file  Stress: Not on file  Social Connections: Not on file     Review of Systems   Gen: Denies fever, chills, anorexia. Denies fatigue, weakness, weight loss.  CV: Denies chest pain, palpitations, syncope, peripheral edema, and claudication. Resp: Denies dyspnea at rest, cough, wheezing, coughing up blood, and pleurisy. GI: See HPI Derm: Denies rash, itching, dry skin Psych: Denies depression, anxiety, memory loss, confusion. No homicidal or suicidal ideation.  Heme: Denies bruising, bleeding, and enlarged lymph nodes.   Physical Exam   BP 122/87 (BP Location: Left Arm, Patient Position: Sitting, Cuff Size: Large)   Pulse 85   Temp (!) 97.3 F (36.3 C) (Temporal)   Ht 5' (1.524 m)   Wt 189 lb 8 oz (86 kg)   LMP 03/28/2022  (Approximate)   BMI 37.01 kg/m   General:   Alert and oriented.   No distress noted. Pleasant and cooperative.  Head:  Normocephalic and atraumatic. Eyes:  Conjuctiva clear without scleral icterus. Mouth:  Oral mucosa pink and moist. Good dentition. No lesions. Lungs:  Clear to auscultation bilaterally. No wheezes, rales, or rhonchi. No distress.  Heart:  S1, S2 present without murmurs appreciated.  Abdomen:  +BS, soft, non-distended.  TTP to left upper quadrant and epigastrium.  Mild TTP to lower abdomen.  No rebound or guarding. No HSM or masses noted. Rectal: deferred Msk:  Symmetrical without gross deformities. Normal posture. Extremities:  Without edema. Neurologic:  Alert and  oriented x4 Psych:  Alert and cooperative. Normal mood and affect.   Assessment  Rebecca Boyd is a 47 y.o. female with a history of anemia, anxiety, depression, HTN, migraines presenting today with abdominal pain.   Abdominal pain, epigastric/melena: Symptoms present for approximately 1 month.  Having epigastric discomfort and left upper quadrant discomfort that at times radiates into her chest.  Denies any typical reflux symptoms but has noted a little bit of nausea.  Reports a good appetite.  No vomiting.  Denies any NSAID use or alcohol use.  Denies any BRBPR but has reported some episodes of melena since her pain began.  Eating tends to make her pain worse.  We will proceed with upper endoscopy for further evaluation of gastritis, duodenitis, esophagitis, peptic ulcer disease, or other.  She denies any dysphagia therefore no need for dilation.  She was started on pantoprazole 40 mg once daily.  Appears PCP attempted to test her for H. pylori which has not been completed.  Will likely obtain biopsies during EGD to assess for H. pylori.  PLAN   Proceed with upper endoscopy with propofol by Dr. Carver in near future: the risks, benefits, and alternatives have been discussed with the patient in detail. The  patient states understanding and desires to proceed. ASA 2 UPT Start pantoprazole 40 mg once daily, 30 minutes prior to breakfast GERD diet societal modifications Follow-up in 2 to 3 months after procedure.  Sooner if needed.    Raford Brissett, MSN, FNP-BC, AGACNP-BC [facility] 

## 2022-07-04 ENCOUNTER — Encounter: Payer: Self-pay | Admitting: Gastroenterology

## 2022-07-04 ENCOUNTER — Telehealth (INDEPENDENT_AMBULATORY_CARE_PROVIDER_SITE_OTHER): Payer: Self-pay | Admitting: Internal Medicine

## 2022-07-04 ENCOUNTER — Ambulatory Visit (INDEPENDENT_AMBULATORY_CARE_PROVIDER_SITE_OTHER): Payer: BC Managed Care – PPO | Admitting: Gastroenterology

## 2022-07-04 VITALS — BP 122/87 | HR 85 | Temp 97.3°F | Ht 60.0 in | Wt 189.5 lb

## 2022-07-04 DIAGNOSIS — K921 Melena: Secondary | ICD-10-CM

## 2022-07-04 DIAGNOSIS — K219 Gastro-esophageal reflux disease without esophagitis: Secondary | ICD-10-CM | POA: Diagnosis not present

## 2022-07-04 DIAGNOSIS — R1013 Epigastric pain: Secondary | ICD-10-CM

## 2022-07-04 MED ORDER — PANTOPRAZOLE SODIUM 40 MG PO TBEC: 40.0000 mg | DELAYED_RELEASE_TABLET | Freq: Every day | ORAL | 3 refills | Status: DC

## 2022-07-04 NOTE — Addendum Note (Signed)
Addended by: Vicente Males on: 07/04/2022 11:27 AM   Modules accepted: Orders

## 2022-07-04 NOTE — Telephone Encounter (Signed)
error 

## 2022-07-04 NOTE — Patient Instructions (Addendum)
We are scheduling you for an upper endoscopy in the near future with Dr. Abbey Chatters.  Please start taking pantoprazole 40 mg once daily, 30 minutes prior to breakfast.  Follow a GERD diet:  Avoid fried, fatty, greasy, spicy, citrus foods. Avoid caffeine and carbonated beverages. Avoid chocolate. Try eating 4-6 small meals a day rather than 3 large meals. Do not eat within 3 hours of laying down. Prop head of bed up on wood or bricks to create a 6 inch incline. Avoid any NSAIDs (ibuprofen, Aleve, Advil, BC or Goody powders)  We will plan to see you in 2-3 months after your procedure.  Please not hesitate to reach out any sooner if your pain worsens.  It was a pleasure to see you today. I want to create trusting relationships with patients. If you receive a survey regarding your visit,  I greatly appreciate you taking time to fill this out on paper or through your MyChart. I value your feedback.  Venetia Night, MSN, FNP-BC, AGACNP-BC [facility]

## 2022-07-05 ENCOUNTER — Inpatient Hospital Stay (HOSPITAL_COMMUNITY): Admission: RE | Admit: 2022-07-05 | Payer: BC Managed Care – PPO | Source: Ambulatory Visit

## 2022-07-05 ENCOUNTER — Institutional Professional Consult (permissible substitution) (HOSPITAL_BASED_OUTPATIENT_CLINIC_OR_DEPARTMENT_OTHER): Payer: BC Managed Care – PPO | Admitting: Pulmonary Disease

## 2022-07-05 ENCOUNTER — Other Ambulatory Visit (HOSPITAL_COMMUNITY)
Admission: RE | Admit: 2022-07-05 | Discharge: 2022-07-05 | Disposition: A | Discharge status: Home / Self Care | Payer: BC Managed Care – PPO | Source: Ambulatory Visit | Attending: Internal Medicine | Admitting: Internal Medicine

## 2022-07-05 ENCOUNTER — Ambulatory Visit: Payer: BC Managed Care – PPO | Admitting: Urology

## 2022-07-05 DIAGNOSIS — K2970 Gastritis, unspecified, without bleeding: Secondary | ICD-10-CM | POA: Diagnosis not present

## 2022-07-05 DIAGNOSIS — R1013 Epigastric pain: Secondary | ICD-10-CM | POA: Insufficient documentation

## 2022-07-05 DIAGNOSIS — F419 Anxiety disorder, unspecified: Secondary | ICD-10-CM | POA: Diagnosis not present

## 2022-07-05 DIAGNOSIS — Z79899 Other long term (current) drug therapy: Secondary | ICD-10-CM | POA: Diagnosis not present

## 2022-07-05 DIAGNOSIS — G43909 Migraine, unspecified, not intractable, without status migrainosus: Secondary | ICD-10-CM | POA: Diagnosis not present

## 2022-07-05 DIAGNOSIS — K2100 Gastro-esophageal reflux disease with esophagitis, without bleeding: Secondary | ICD-10-CM | POA: Diagnosis not present

## 2022-07-05 DIAGNOSIS — Z800 Family history of malignant neoplasm of digestive organs: Secondary | ICD-10-CM | POA: Diagnosis not present

## 2022-07-05 DIAGNOSIS — Z87891 Personal history of nicotine dependence: Secondary | ICD-10-CM | POA: Diagnosis not present

## 2022-07-05 DIAGNOSIS — I10 Essential (primary) hypertension: Secondary | ICD-10-CM | POA: Diagnosis not present

## 2022-07-05 DIAGNOSIS — K2282 Esophagogastric junction polyp: Secondary | ICD-10-CM | POA: Diagnosis not present

## 2022-07-05 DIAGNOSIS — J45909 Unspecified asthma, uncomplicated: Secondary | ICD-10-CM | POA: Diagnosis not present

## 2022-07-05 DIAGNOSIS — K921 Melena: Secondary | ICD-10-CM

## 2022-07-05 DIAGNOSIS — F32A Depression, unspecified: Secondary | ICD-10-CM | POA: Diagnosis not present

## 2022-07-05 LAB — PREGNANCY, URINE: Preg Test, Ur: NEGATIVE

## 2022-07-06 ENCOUNTER — Institutional Professional Consult (permissible substitution) (HOSPITAL_BASED_OUTPATIENT_CLINIC_OR_DEPARTMENT_OTHER): Payer: BC Managed Care – PPO | Admitting: Pulmonary Disease

## 2022-07-07 ENCOUNTER — Other Ambulatory Visit: Payer: Self-pay

## 2022-07-07 ENCOUNTER — Encounter (HOSPITAL_COMMUNITY): Payer: Self-pay

## 2022-07-07 ENCOUNTER — Ambulatory Visit (HOSPITAL_COMMUNITY): Payer: BC Managed Care – PPO | Admitting: Anesthesiology

## 2022-07-07 ENCOUNTER — Ambulatory Visit (HOSPITAL_COMMUNITY)
Admission: RE | Admit: 2022-07-07 | Discharge: 2022-07-07 | Disposition: A | Discharge status: Home / Self Care | Payer: BC Managed Care – PPO | Source: Ambulatory Visit | Attending: Internal Medicine | Admitting: Internal Medicine

## 2022-07-07 ENCOUNTER — Encounter (HOSPITAL_COMMUNITY)
Admission: RE | Disposition: A | Discharge status: Home / Self Care | Payer: Self-pay | Source: Ambulatory Visit | Attending: Internal Medicine

## 2022-07-07 DIAGNOSIS — Z800 Family history of malignant neoplasm of digestive organs: Secondary | ICD-10-CM | POA: Insufficient documentation

## 2022-07-07 DIAGNOSIS — Z87891 Personal history of nicotine dependence: Secondary | ICD-10-CM | POA: Insufficient documentation

## 2022-07-07 DIAGNOSIS — K2282 Esophagogastric junction polyp: Secondary | ICD-10-CM | POA: Insufficient documentation

## 2022-07-07 DIAGNOSIS — F32A Depression, unspecified: Secondary | ICD-10-CM | POA: Diagnosis not present

## 2022-07-07 DIAGNOSIS — Z79899 Other long term (current) drug therapy: Secondary | ICD-10-CM | POA: Diagnosis not present

## 2022-07-07 DIAGNOSIS — I10 Essential (primary) hypertension: Secondary | ICD-10-CM | POA: Diagnosis not present

## 2022-07-07 DIAGNOSIS — G43909 Migraine, unspecified, not intractable, without status migrainosus: Secondary | ICD-10-CM | POA: Diagnosis not present

## 2022-07-07 DIAGNOSIS — K2100 Gastro-esophageal reflux disease with esophagitis, without bleeding: Secondary | ICD-10-CM | POA: Diagnosis not present

## 2022-07-07 DIAGNOSIS — K2970 Gastritis, unspecified, without bleeding: Secondary | ICD-10-CM | POA: Insufficient documentation

## 2022-07-07 DIAGNOSIS — R1013 Epigastric pain: Secondary | ICD-10-CM | POA: Insufficient documentation

## 2022-07-07 DIAGNOSIS — F419 Anxiety disorder, unspecified: Secondary | ICD-10-CM | POA: Diagnosis not present

## 2022-07-07 DIAGNOSIS — J45909 Unspecified asthma, uncomplicated: Secondary | ICD-10-CM | POA: Insufficient documentation

## 2022-07-07 HISTORY — PX: BIOPSY: SHX5522

## 2022-07-07 HISTORY — PX: ESOPHAGOGASTRODUODENOSCOPY (EGD) WITH PROPOFOL: SHX5813

## 2022-07-07 HISTORY — PX: POLYPECTOMY: SHX5525

## 2022-07-07 SURGERY — ESOPHAGOGASTRODUODENOSCOPY (EGD) WITH PROPOFOL
Anesthesia: General

## 2022-07-07 MED ORDER — PROPOFOL 10 MG/ML IV BOLUS
INTRAVENOUS | Status: DC | PRN
  Administered 2022-07-07: 50 mg via INTRAVENOUS
  Administered 2022-07-07: 100 mg via INTRAVENOUS
  Administered 2022-07-07: 200 mg via INTRAVENOUS

## 2022-07-07 MED ORDER — LIDOCAINE HCL (CARDIAC) PF 100 MG/5ML IV SOSY
PREFILLED_SYRINGE | INTRAVENOUS | Status: DC | PRN
  Administered 2022-07-07: 50 mg via INTRAVENOUS

## 2022-07-07 MED ORDER — LACTATED RINGERS IV SOLN
INTRAVENOUS | Status: DC
  Administered 2022-07-07: 1000 mL via INTRAVENOUS

## 2022-07-07 MED ORDER — PANTOPRAZOLE SODIUM 40 MG PO TBEC: 40.0000 mg | DELAYED_RELEASE_TABLET | Freq: Two times a day (BID) | ORAL | 11 refills | Status: DC

## 2022-07-07 NOTE — Op Note (Signed)
[facility] Patient Name: Rebecca Boyd Procedure Date: 07/07/2022 2:08 PM MRN: [id number] Date of Birth: [birth year] Attending MD: Elon Alas. Abbey Chatters , [place], [id number] CSN: [id number] Age: 47 Admit Type: Outpatient Procedure:                Upper GI endoscopy Indications:              Epigastric abdominal pain Providers:                Elon Alas. Abbey Chatters, DO, Caprice Kluver, [place], Technician Referring MD:              Medicines:                See the Anesthesia note for documentation of the                            administered medications Complications:            No immediate complications. Estimated Blood Loss:     Estimated blood loss was minimal. Procedure:                Pre-Anesthesia Assessment:                           - The anesthesia plan was to use monitored                            anesthesia care (MAC).                           After obtaining informed consent, the endoscope was                            passed under direct vision. Throughout the                            procedure, the patient's blood pressure, pulse, and                            oxygen saturations were monitored continuously. The                            [id number]-[id number]) scope was introduced through the                            mouth, and advanced to the second part of duodenum.                            The upper GI endoscopy was accomplished without                            difficulty. The patient tolerated the procedure                            well. Scope In: 2:21:00  PM Scope Out: 2:26:31 PM Total Procedure Duration: 0 hours 5 minutes 31 seconds  Findings:      One 3 mm polyp with no bleeding was found at the gastroesophageal       junction. The polyp was removed with a cold biopsy forceps. Resection       and retrieval were complete.      Patchy mild inflammation characterized by erythema was found in the       gastric body and  in the gastric antrum. Biopsies were taken with a cold       forceps for Helicobacter pylori testing.      The duodenal bulb, first portion of the duodenum and second portion of       the duodenum were normal.      Non-severe esophagitis with no bleeding was found at the       gastroesophageal junction. 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                            Resected and retrieved.                           - Gastritis. Biopsied.                           - Normal duodenal bulb, first portion of the                            duodenum and second portion of the duodenum.                           - Non-severe reflux esophagitis with no bleeding. Moderate Sedation:      Per Anesthesia Care Recommendation:           - Patient has a contact number available for                            emergencies. The signs and symptoms of potential                            delayed complications were discussed with the                            patient. Return to normal activities tomorrow.                            Written discharge instructions were provided to the                            patient.                           - Resume previous diet.                           - Continue present medications.                           -  Await pathology results.                           - Return to GI clinic in 2 weeks.                           - Use Protonix (pantoprazole) 40 mg PO BID for 12                            weeks. Procedure Code(s):        --- Professional ---                           [phone], Esophagogastroduodenoscopy, flexible,                            transoral; with biopsy, single or multiple Diagnosis Code(s):        --- Professional ---                           K22.82, Esophagogastric junction polyp                           K29.70, Gastritis, unspecified, without bleeding                           R10.13, Epigastric pain CPT  copyright 2022 American Medical Association. All rights reserved. The codes documented in this report are preliminary and upon coder review may  be revised to meet current compliance requirements. Elon Alas. Abbey Chatters, DO Lewis Abbey Chatters, DO 07/07/2022 2:32:06 PM This report has been signed electronically. Number of Addenda: 0

## 2022-07-07 NOTE — Interval H&P Note (Signed)
History and Physical Interval Note:  07/07/2022 1:28 PM  Rebecca Boyd  has presented today for surgery, with the diagnosis of melena, epigastric pain.  The various methods of treatment have been discussed with the patient and family. After consideration of risks, benefits and other options for treatment, the patient has consented to  Procedure(s) with comments: ESOPHAGOGASTRODUODENOSCOPY (EGD) WITH PROPOFOL (N/A) - 245pm, asa 2 as a surgical intervention.  The patient's history has been reviewed, patient examined, no change in status, stable for surgery.  I have reviewed the patient's chart and labs.  Questions were answered to the patient's satisfaction.     Eloise Harman

## 2022-07-07 NOTE — Anesthesia Preprocedure Evaluation (Signed)
Anesthesia Evaluation  Patient identified by MRN, date of birth, ID band Patient awake    Reviewed: Allergy & Precautions, H&P , NPO status , Patient's Chart, lab work & pertinent test results, reviewed documented beta blocker date and time   Airway Mallampati: II  TM Distance: >3 FB Neck ROM: full    Dental no notable dental hx.    Pulmonary neg pulmonary ROS, asthma , former smoker   Pulmonary exam normal breath sounds clear to auscultation       Cardiovascular Exercise Tolerance: Good hypertension, negative cardio ROS  Rhythm:regular Rate:Normal     Neuro/Psych  Headaches PSYCHIATRIC DISORDERS Anxiety Depression    negative neurological ROS  negative psych ROS   GI/Hepatic negative GI ROS, Neg liver ROS,,,  Endo/Other  negative endocrine ROS    Renal/GU negative Renal ROS  negative genitourinary   Musculoskeletal   Abdominal   Peds  Hematology negative hematology ROS (+) Blood dyscrasia, anemia   Anesthesia Other Findings   Reproductive/Obstetrics negative OB ROS                             Anesthesia Physical Anesthesia Plan  ASA: 2  Anesthesia Plan: General   Post-op Pain Management:    Induction:   PONV Risk Score and Plan: Propofol infusion  Airway Management Planned:   Additional Equipment:   Intra-op Plan:   Post-operative Plan:   Informed Consent: I have reviewed the patients History and Physical, chart, labs and discussed the procedure including the risks, benefits and alternatives for the proposed anesthesia with the patient or authorized representative who has indicated his/her understanding and acceptance.     Dental Advisory Given  Plan Discussed with: CRNA  Anesthesia Plan Comments:        Anesthesia Quick Evaluation

## 2022-07-07 NOTE — Anesthesia Procedure Notes (Signed)
Date/Time: 07/07/2022 2:15 PM  Performed by: Vista Deck, CRNAPre-anesthesia Checklist: Patient identified, Emergency Drugs available, Suction available, Timeout performed and Patient being monitored Patient Re-evaluated:Patient Re-evaluated prior to induction Oxygen Delivery Method: Nasal Cannula

## 2022-07-07 NOTE — Transfer of Care (Signed)
Immediate Anesthesia Transfer of Care Note  Patient: Rebecca Boyd  Procedure(s) Performed: ESOPHAGOGASTRODUODENOSCOPY (EGD) WITH PROPOFOL BIOPSY POLYPECTOMY  Patient Location: Endoscopy Unit  Anesthesia Type:General  Level of Consciousness: awake and patient cooperative  Airway & Oxygen Therapy: Patient Spontanous Breathing  Post-op Assessment: Report given to RN and Post -op Vital signs reviewed and stable  Post vital signs: Reviewed and stable  Last Vitals:  Vitals Value Taken Time  BP 90/54 07/07/22 1432  Temp 36.8 C 07/07/22 1432  Pulse 96 07/07/22 1432  Resp 23 07/07/22 1432  SpO2 92 % 07/07/22 1432    Last Pain:  Vitals:   07/07/22 1432  TempSrc: Axillary  PainSc: 0-No pain      Patients Stated Pain Goal: 7 ([id number])  Complications: No notable events documented.

## 2022-07-07 NOTE — Discharge Instructions (Addendum)
EGD Discharge instructions Please read the instructions outlined below and refer to this sheet in the next few weeks. These discharge instructions provide you with general information on caring for yourself after you leave the hospital. Your doctor may also give you specific instructions. While your treatment has been planned according to the most current medical practices available, unavoidable complications occasionally occur. If you have any problems or questions after discharge, please call your doctor. ACTIVITY You may resume your regular activity but move at a slower pace for the next 24 hours.  Take frequent rest periods for the next 24 hours.  Walking will help expel (get rid of) the air and reduce the bloated feeling in your abdomen.  No driving for 24 hours (because of the anesthesia (medicine) used during the test).  You may shower.  Do not sign any important legal documents or operate any machinery for 24 hours (because of the anesthesia used during the test).  NUTRITION Drink plenty of fluids.  You may resume your normal diet.  Begin with a light meal and progress to your normal diet.  Avoid alcoholic beverages for 24 hours or as instructed by your caregiver.  MEDICATIONS You may resume your normal medications unless your caregiver tells you otherwise.  WHAT YOU CAN EXPECT TODAY You may experience abdominal discomfort such as a feeling of fullness or "gas" pains.  FOLLOW-UP Your doctor will discuss the results of your test with you.  SEEK IMMEDIATE MEDICAL ATTENTION IF ANY OF THE FOLLOWING OCCUR: Excessive nausea (feeling sick to your stomach) and/or vomiting.  Severe abdominal pain and distention (swelling).  Trouble swallowing.  Temperature over 101 F (37.8 C).  Rectal bleeding or vomiting of blood.   Your EGD revealed mild amount inflammation in your stomach.  I took biopsies of this to rule out infection with a bacteria called H. pylori.  Await pathology results, my  office will contact you.  You had a small polyp in the end portion of your esophagus which I removed.   Small bowel appeared normal.   I am going to increase your Pantoprazole to 40 mg twice daily for the next 12 weeks at which point you can decrease down to once daily as tolerated.   Follow up in GI clinic in 2-3 months.    I hope you have a great rest of your week!  Rebecca Boyd. Rebecca Boyd, D.O. Gastroenterology and Hepatology [facility]

## 2022-07-10 ENCOUNTER — Ambulatory Visit (INDEPENDENT_AMBULATORY_CARE_PROVIDER_SITE_OTHER): Payer: BC Managed Care – PPO | Admitting: Orthopedic Surgery

## 2022-07-10 ENCOUNTER — Encounter: Payer: Self-pay | Admitting: Orthopedic Surgery

## 2022-07-10 DIAGNOSIS — M47816 Spondylosis without myelopathy or radiculopathy, lumbar region: Secondary | ICD-10-CM | POA: Diagnosis not present

## 2022-07-10 MED ORDER — PREDNISONE 10 MG (48) PO TBPK: ORAL_TABLET | Freq: Every day | ORAL | 0 refills | Status: DC

## 2022-07-10 MED ORDER — GABAPENTIN 300 MG PO CAPS: 300.0000 mg | ORAL_CAPSULE | Freq: Every day | ORAL | 1 refills | Status: DC

## 2022-07-10 MED ORDER — METHOCARBAMOL 750 MG PO TABS: 750.0000 mg | ORAL_TABLET | Freq: Four times a day (QID) | ORAL | 2 refills | Status: DC

## 2022-07-10 NOTE — Patient Instructions (Signed)
You have been referred to [facility] on [facility] in [place] them to reschedule [phone]

## 2022-07-10 NOTE — Progress Notes (Signed)
Chief Complaint  Patient presents with   Back Pain    Lumbar pain continues/ did not see Neurosurgery, had appointment in January cancelled due to pneumonia did not RS/ would prefer not to go to see neurosurgery     Encounter Diagnosis  Name Primary?   Facet arthritis of lumbar region Yes     This is a 47 year old female who I have followed for approximately a year and a half with lower back pain and leg pain status post MRI which shows facet degeneration but no disc protrusion  The facet degeneration involves L4-5 and L5-S1  She has had 2 epidural injections she has had steroids, gabapentin, Tylenol, ibuprofen, she has not gotten improvement significant enough to warrant release from care.  I have no other treatment options for her.  She was scheduled to see neurosurgery for a consult but she got pneumonia so at this point with her symptoms of burning stabbing constant ache in her lower back which radiates down the left leg initially but now also on the right leg which wakes her up at night   Assessment and plan  Encounter Diagnosis  Name Primary?   Facet arthritis of lumbar region Yes     Back pain out of proportion to MRI findings but very symptomatic.  She has failed nonoperative care.  I think she should be referred back to orthospine or neurosurgery for consultation  She can continue gabapentin muscle relaxers and prednisone Dosepak  Meds ordered this encounter  Medications   predniSONE (STERAPRED UNI-PAK 48 TAB) 10 MG (48) TBPK tablet    Sig: Take by mouth daily. 12 days, 10 mg, DS as directed    Dispense:  48 tablet    Refill:  0   methocarbamol (ROBAXIN) 750 MG tablet    Sig: Take 1 tablet (750 mg total) by mouth 4 (four) times daily.    Dispense:  60 tablet    Refill:  2   gabapentin (NEURONTIN) 300 MG capsule    Sig: Take 1 capsule (300 mg total) by mouth at bedtime.    Dispense:  30 capsule    Refill:  1     She wants to be seen for her right shoulder as  well which we can see her with another appointment to include x-ray

## 2022-07-11 ENCOUNTER — Ambulatory Visit (INDEPENDENT_AMBULATORY_CARE_PROVIDER_SITE_OTHER): Payer: BC Managed Care – PPO | Admitting: General Surgery

## 2022-07-11 ENCOUNTER — Encounter: Payer: Self-pay | Admitting: General Surgery

## 2022-07-11 VITALS — BP 142/80 | HR 83 | Temp 98.4°F | Resp 14 | Ht 60.0 in | Wt 186.0 lb

## 2022-07-11 DIAGNOSIS — K432 Incisional hernia without obstruction or gangrene: Secondary | ICD-10-CM | POA: Diagnosis not present

## 2022-07-11 LAB — SURGICAL PATHOLOGY

## 2022-07-11 NOTE — Anesthesia Postprocedure Evaluation (Signed)
Anesthesia Post Note  Patient: Rebecca Boyd  Procedure(s) Performed: ESOPHAGOGASTRODUODENOSCOPY (EGD) WITH PROPOFOL BIOPSY POLYPECTOMY  Patient location during evaluation: Phase II Anesthesia Type: General Level of consciousness: awake Pain management: pain level controlled Vital Signs Assessment: post-procedure vital signs reviewed and stable Respiratory status: spontaneous breathing and respiratory function stable Cardiovascular status: blood pressure returned to baseline and stable Postop Assessment: no headache and no apparent nausea or vomiting Anesthetic complications: no Comments: Late entry   No notable events documented.   Last Vitals:  Vitals:   07/07/22 1432 07/07/22 1435  BP: (!) 90/54 (!) 96/59  Pulse: 96 84  Resp: (!) 23 18  Temp: 36.8 C   SpO2: 92% 96%    Last Pain:  Vitals:   07/07/22 1432  TempSrc: Axillary  PainSc: 0-No pain                 Louann Sjogren

## 2022-07-12 NOTE — Progress Notes (Signed)
Rebecca Boyd; [id number]; 01/24/1976   HPI Patient is a 47 year old black female who was referred to my care by Dr. Moshe Cipro for evaluation and treatment of an umbilical hernia.  This was found on CT scan of the abdomen to assess upper abdominal pain.  Patient states she was not even aware she had an umbilical hernia.  She denies any periumbilical pain.  She did have a laparoscopic procedure with incision in the umbilicus in the past. 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   Initial dx is 05/2013    Hypertension    Migraine    Migraines    Miscarriage 2009   Neck pain    Nondisplaced fracture of fifth metatarsal bone, left foot, subsequent encounter for fracture with routine healing 08/15/18 09/25/2018   Pruritus 09/29/2018   Pruritus ani 09/25/2018   Rash and nonspecific skin eruption 05/14/2017   Vaginitis    cyctitis    Past Surgical History:  Procedure Laterality Date   cervical cryotherapy N/A 1999   COLONOSCOPY WITH PROPOFOL N/A 05/10/2021   Procedure: COLONOSCOPY WITH PROPOFOL;  Surgeon: Eloise Harman, DO;  Location: [facility];  Service: Endoscopy;  Laterality: N/A;  11:30am   DILITATION & CURRETTAGE/HYSTROSCOPY WITH THERMACHOICE ABLATION  08/26/2013   Procedure: DILATATION & CURETTAGE/HYSTEROSCOPY WITH THERMACHOICE ENDOMETRIAL ABLATION Procedure #2 Total Therapy Time=min       sec;  Surgeon: Jonnie Kind, MD;  Location: [facility];  Service: Gynecology;;   LAPAROSCOPIC BILATERAL SALPINGECTOMY Bilateral  08/26/2013   Procedure: LAPAROSCOPIC BILATERAL SALPINGECTOMY AND REMOVAL OF LEFT PERITUBAL CYST Procedure #1;  Surgeon: Jonnie Kind, MD;  Location: [facility];  Service: Gynecology;  Laterality: Bilateral;   LAPAROSCOPIC LYSIS OF ADHESIONS  08/26/2013   Procedure: LAPAROSCOPIC LYSIS OF ADHESIONS Procedure #1;  Surgeon: Jonnie Kind, MD;  Location: [facility];  Service: Gynecology;;   POLYPECTOMY  05/10/2021   Procedure: POLYPECTOMY;  Surgeon: Eloise Harman, DO;  Location: [facility];  Service: Endoscopy;;   VULVAR LESION REMOVAL  08/26/2013   Procedure: REMOVAL OF VULVAR SEBACEOUS CYST Procedure #3;  Surgeon: Jonnie Kind, MD;  Location: [facility];  Service: Gynecology;;    Family History  Problem Relation Age of Onset   Depression Mother    Drug abuse Mother    Hypertension Father    Breast cancer Sister        dx 55   Hypertension Maternal Grandmother    Hypertension Maternal Grandfather    Lung cancer Maternal Grandfather    Hypertension Paternal Grandmother    Breast cancer Paternal Grandmother        dx late 70s   Colon cancer Maternal Aunt        62   Colon cancer Cousin 54       maternal    Current Outpatient Medications on File Prior to Visit  Medication Sig Dispense Refill   acyclovir (ZOVIRAX) 400 MG tablet  Take 1 tablet (400 mg total) by mouth 3 (three) times daily. (Patient taking differently: Take 400 mg by mouth in the morning.) 21 tablet 1   albuterol (VENTOLIN HFA) 108 (90 Base) MCG/ACT inhaler Inhale 1-2 puffs into the lungs every 4 (four) hours as needed for wheezing or shortness of breath. 18 g 3   amLODipine (NORVASC) 10 MG tablet Take 1 tablet (10 mg total) by mouth daily. 90 tablet 2   betamethasone dipropionate 0.05 % cream Apply topically 2 (two) times daily. 45 g 0   busPIRone (BUSPAR) 5 MG tablet Take 1 tablet (5 mg total) by mouth 3 (three) times daily. (Patient taking differently: Take 5 mg by mouth 3 (three) times daily as needed (anxiety).) 90 tablet  2   cetirizine (ZYRTEC ALLERGY) 10 MG tablet Take 1 tablet (10 mg total) by mouth daily for 14 days. (Patient taking differently: Take 10 mg by mouth daily as needed for allergies.) 14 tablet 0   Erenumab-aooe (AIMOVIG) 70 MG/ML SOAJ Inject 70 mg into the skin every 30 (thirty) days. (Patient taking differently: Inject 70 mg into the skin See admin instructions. Inject once month as needed for migraine reoccurrence.) 1 pen 11   escitalopram (LEXAPRO) 10 MG tablet Take 1 tablet (10 mg total) by mouth daily. 30 tablet 2   fluticasone (FLONASE) 50 MCG/ACT nasal spray Place 2 sprays into both nostrils daily. (Patient taking differently: Place 2 sprays into both nostrils daily as needed for allergies.) 16 g 6   fluticasone-salmeterol (ADVAIR) 500-50 MCG/ACT AEPB Inhale 1 puff into the lungs in the morning and at bedtime. (Patient taking differently: Inhale 1 puff into the lungs 2 (two) times daily as needed (respiratory issues.).) 60 each 3   gabapentin (NEURONTIN) 300 MG capsule Take 1 capsule (300 mg total) by mouth at bedtime. 30 capsule 1   imipramine (TOFRANIL) 25 MG tablet Take 25 mg by mouth at bedtime as needed (back pain.).     methocarbamol (ROBAXIN) 750 MG tablet Take 1 tablet (750 mg total) by mouth 4 (four) times daily. 60 tablet 2   montelukast (SINGULAIR) 10 MG tablet Take 1 tablet (10 mg total) by mouth at bedtime. (Patient taking differently: Take 10 mg by mouth daily as needed (allergies.).) 30 tablet 3   oxyCODONE-acetaminophen (PERCOCET/ROXICET) 5-325 MG tablet Take 1 tablet by mouth 2 (two) times daily as needed (pain.).     pantoprazole (PROTONIX) 40 MG tablet Take 1 tablet (40 mg total) by mouth 2 (two) times daily. 60 tablet 11   phentermine 15 MG capsule Take 1 capsule (15 mg total) by mouth every morning. (Patient taking differently: Take 15 mg by mouth daily as needed (appetite suppressant).) 30 capsule 2   predniSONE (STERAPRED UNI-PAK 48 TAB) 10 MG (48) TBPK tablet Take by mouth  daily. 12 days, 10 mg, DS as directed 48 tablet 0   SUMAtriptan (IMITREX) 6 MG/0.5ML SOLN injection Use one injection at onset of migraine.  May repeat in 2 hrs, if needed.  Max dose: 2 inj/day. This is a 30 day prescription. 5 mL 11   topiramate (TOPAMAX) 100 MG tablet Take 1 tablet (100 mg total) by mouth 2 (two) times daily. (Patient taking differently: Take 100 mg by mouth in the morning.) 180 tablet 4   No current facility-administered medications on file prior to visit.    Allergies  Allergen Reactions   Diphenhydramine Hcl Anaphylaxis    (Benadryl)   Orange Fruit Hives, Shortness Of Breath, Itching and Other (  See Comments)    MAKES IT DIFFICULT TO BREATH   Latex Rash    Social History   Substance and Sexual Activity  Alcohol Use Yes   Comment: occasional    Social History   Tobacco Use  Smoking Status Former   Packs/day: 0.00   Years: 1.00   Total pack years: 0.00   Types: Cigars, Cigarettes   Quit date: 01/05/2017   Years since quitting: 5.5  Smokeless Tobacco Never    Review of Systems  Constitutional: Negative.   HENT: Negative.    Eyes: Negative.   Respiratory: Negative.    Cardiovascular: Negative.   Gastrointestinal:  Positive for abdominal pain.  Genitourinary: Negative.   Musculoskeletal:  Positive for back pain.  Skin: Negative.   Neurological: Negative.   Endo/Heme/Allergies: Negative.   Psychiatric/Behavioral: Negative.      Objective   Vitals:   07/11/22 1334  BP: (!) 142/80  Pulse: 83  Resp: 14  Temp: 98.4 F (36.9 C)  SpO2: 97%    Physical Exam Vitals reviewed.  Constitutional:      Appearance: Normal appearance. She is obese. She is not ill-appearing.  HENT:     Head: Normocephalic and atraumatic.  Cardiovascular:     Rate and Rhythm: Normal rate and regular rhythm.     Heart sounds: Normal heart sounds. No murmur heard.    No friction rub. No gallop.  Pulmonary:     Effort: Pulmonary effort is normal. No respiratory  distress.     Breath sounds: Normal breath sounds. No stridor. No wheezing, rhonchi or rales.  Abdominal:     General: Bowel sounds are normal. There is no distension.     Palpations: Abdomen is soft. There is no mass.     Tenderness: There is no abdominal tenderness. There is no guarding or rebound.     Hernia: A hernia is present.     Comments: Small 1.5 cm umbilical hernia beneath the surgical incision.  It is reducible.  It does cause some discomfort when palpating.  Skin:    General: Skin is warm and dry.  Neurological:     Mental Status: She is alert and oriented to person, place, and time.    CT scan images personally reviewed.  Fat-containing umbilical hernia. Assessment  Incisional (umbilical) hernia, reducible.  Currently asymptomatic.  I do not think this is causing her upper abdominal discomfort given the pattern of her symptoms. Plan  I told the patient should she develop more periumbilical pain and swelling, she should return to my care for further evaluation and treatment.  I do not think repair of this hernia is warranted at this time.  Patient understands and agrees.  Follow-up here as needed.

## 2022-07-17 ENCOUNTER — Ambulatory Visit (HOSPITAL_COMMUNITY): Payer: BC Managed Care – PPO | Admitting: Psychiatry

## 2022-07-18 ENCOUNTER — Encounter (HOSPITAL_COMMUNITY): Payer: Self-pay | Admitting: Internal Medicine

## 2022-07-18 DIAGNOSIS — E559 Vitamin D deficiency, unspecified: Secondary | ICD-10-CM | POA: Diagnosis not present

## 2022-07-18 DIAGNOSIS — M129 Arthropathy, unspecified: Secondary | ICD-10-CM | POA: Diagnosis not present

## 2022-07-18 DIAGNOSIS — Z79899 Other long term (current) drug therapy: Secondary | ICD-10-CM | POA: Diagnosis not present

## 2022-07-18 DIAGNOSIS — M545 Low back pain, unspecified: Secondary | ICD-10-CM | POA: Diagnosis not present

## 2022-07-18 LAB — LAB REPORT - SCANNED
Creatinine, POC: 127.6 mg/dL
EGFR: 135

## 2022-07-27 ENCOUNTER — Encounter: Payer: BC Managed Care – PPO | Admitting: Orthopedic Surgery

## 2022-08-01 ENCOUNTER — Telehealth: Payer: Self-pay

## 2022-08-01 ENCOUNTER — Encounter: Payer: Self-pay | Admitting: Family Medicine

## 2022-08-01 ENCOUNTER — Ambulatory Visit: Payer: BC Managed Care – PPO | Admitting: Family Medicine

## 2022-08-01 VITALS — BP 121/87 | HR 85 | Ht 60.0 in | Wt 183.1 lb

## 2022-08-01 DIAGNOSIS — I10 Essential (primary) hypertension: Secondary | ICD-10-CM | POA: Diagnosis not present

## 2022-08-01 DIAGNOSIS — R7989 Other specified abnormal findings of blood chemistry: Secondary | ICD-10-CM

## 2022-08-01 DIAGNOSIS — E785 Hyperlipidemia, unspecified: Secondary | ICD-10-CM

## 2022-08-01 DIAGNOSIS — J4521 Mild intermittent asthma with (acute) exacerbation: Secondary | ICD-10-CM | POA: Diagnosis not present

## 2022-08-01 DIAGNOSIS — T782XXD Anaphylactic shock, unspecified, subsequent encounter: Secondary | ICD-10-CM

## 2022-08-01 DIAGNOSIS — G43909 Migraine, unspecified, not intractable, without status migrainosus: Secondary | ICD-10-CM

## 2022-08-01 DIAGNOSIS — R7303 Prediabetes: Secondary | ICD-10-CM | POA: Diagnosis not present

## 2022-08-01 DIAGNOSIS — F321 Major depressive disorder, single episode, moderate: Secondary | ICD-10-CM

## 2022-08-01 MED ORDER — BETAMETHASONE DIPROPIONATE 0.05 % EX CREA: TOPICAL_CREAM | Freq: Two times a day (BID) | CUTANEOUS | 0 refills | Status: DC

## 2022-08-01 MED ORDER — PHENTERMINE HCL 15 MG PO CAPS: 15.0000 mg | ORAL_CAPSULE | ORAL | 2 refills | Status: DC

## 2022-08-01 MED ORDER — MONTELUKAST SODIUM 10 MG PO TABS: 10.0000 mg | ORAL_TABLET | Freq: Every day | ORAL | 3 refills | Status: DC

## 2022-08-01 MED ORDER — FLUTICASONE PROPIONATE 50 MCG/ACT NA SUSP: 2.0000 | Freq: Every day | NASAL | 6 refills | Status: AC

## 2022-08-01 MED ORDER — BUSPIRONE HCL 5 MG PO TABS: 5.0000 mg | ORAL_TABLET | Freq: Two times a day (BID) | ORAL | 5 refills | Status: DC

## 2022-08-01 MED ORDER — EPINEPHRINE 0.3 MG/0.3ML IJ SOAJ: 0.3000 mg | INTRAMUSCULAR | 1 refills | Status: AC | PRN

## 2022-08-01 NOTE — Patient Instructions (Signed)
F/U in 10 to 11 weeks, call if you need me sooner  Weight loss goal of 10 pounds  Phentermine capsule 15 mg one daily is prescribed  It is important that you exercise regularly at least 30 minutes 5 times a week. If you develop chest pain, have severe difficulty breathing, or feel very tired, stop exercising immediately and seek medical attention   Labs today already ordered in January, results will be messaged to you  You are referred to Allergist  Epi pen prescribed  Think about what you will eat, plan ahead. Choose " clean, green, fresh or frozen" over canned, processed or packaged foods which are more sugary, salty and fatty. 70 to 75% of food eaten should be vegetables and fruit. Three meals at set times with snacks allowed between meals, but they must be fruit or vegetables. Aim to eat over a 12 hour period , example 7 am to 7 pm, and STOP after  your last meal of the day. Drink water,generally about 64 ounces per day, no other drink is as healthy. Fruit juice is best enjoyed in a healthy way, by EATING the fruit. Thanks for choosing [facility], we consider it a privelige to serve you.

## 2022-08-01 NOTE — Telephone Encounter (Signed)
Patient stopped in to check the status of her FMLA forms.  I told her that she would be contacted with update.

## 2022-08-02 DIAGNOSIS — G894 Chronic pain syndrome: Secondary | ICD-10-CM | POA: Diagnosis not present

## 2022-08-02 DIAGNOSIS — Z6834 Body mass index (BMI) 34.0-34.9, adult: Secondary | ICD-10-CM | POA: Diagnosis not present

## 2022-08-02 DIAGNOSIS — M545 Low back pain, unspecified: Secondary | ICD-10-CM | POA: Diagnosis not present

## 2022-08-02 DIAGNOSIS — Z79899 Other long term (current) drug therapy: Secondary | ICD-10-CM | POA: Diagnosis not present

## 2022-08-02 DIAGNOSIS — E559 Vitamin D deficiency, unspecified: Secondary | ICD-10-CM | POA: Diagnosis not present

## 2022-08-02 LAB — BMP8+EGFR
BUN/Creatinine Ratio: 7 — ABNORMAL LOW (ref 9–23)
BUN: 5 mg/dL — ABNORMAL LOW (ref 6–24)
CO2: 20 mmol/L (ref 20–29)
Calcium: 9.2 mg/dL (ref 8.7–10.2)
Chloride: 104 mmol/L (ref 96–106)
Creatinine, Ser: 0.69 mg/dL (ref 0.57–1.00)
Glucose: 104 mg/dL — ABNORMAL HIGH (ref 70–99)
Potassium: 3.4 mmol/L — ABNORMAL LOW (ref 3.5–5.2)
Sodium: 140 mmol/L (ref 134–144)
eGFR: 108 mL/min/{1.73_m2} (ref >59)

## 2022-08-02 LAB — LIPID PANEL
Chol/HDL Ratio: 3.5 ratio (ref 0.0–4.4)
Cholesterol, Total: 184 mg/dL (ref 100–199)
HDL: 52 mg/dL (ref >39)
LDL Chol Calc (NIH): 109 mg/dL — ABNORMAL HIGH (ref 0–99)
Triglycerides: 130 mg/dL (ref 0–149)
VLDL Cholesterol Cal: 23 mg/dL (ref 5–40)

## 2022-08-02 LAB — HEMOGLOBIN A1C
Est. average glucose Bld gHb Est-mCnc: 108 mg/dL
Hgb A1c MFr Bld: 5.4 % (ref 4.8–5.6)

## 2022-08-06 NOTE — Assessment & Plan Note (Signed)
Hyperlipidemia:Low fat diet discussed and encouraged.   Lipid Panel  Lab Results  Component Value Date   CHOL 184 08/01/2022   HDL 52 08/01/2022   LDLCALC 109 (H) 08/01/2022   TRIG 130 08/01/2022   CHOLHDL 3.5 08/01/2022     Needs to reduce fat in diet

## 2022-08-06 NOTE — Assessment & Plan Note (Signed)
Controlled, no change in medication DASH diet and commitment to daily physical activity for a minimum of 30 minutes discussed and encouraged, as a part of hypertension management. The importance of attaining a healthy weight is also discussed.     08/01/2022    8:43 AM 07/11/2022    1:34 PM 07/07/2022    2:35 PM 07/07/2022    2:32 PM 07/07/2022    1:14 PM 07/04/2022    9:27 AM 06/15/2022   11:56 AM  BP/Weight  Systolic BP 121 142 96 90 139 025 126  Diastolic BP 87 80 59 54 83 87 84  Wt. (Lbs) 183.08 186   185 189.5   BMI 35.76 kg/m2 36.33 kg/m2   36.13 kg/m2 37.01 kg/m2

## 2022-08-06 NOTE — Assessment & Plan Note (Addendum)
Controlled, no change in medication, pulmonary also seeing pt since recent hospitalization

## 2022-08-06 NOTE — Assessment & Plan Note (Signed)
Updated lab needed at/ before next visit. Level corected and within normal, OTC 1000 IU daily sufficient

## 2022-08-06 NOTE — Progress Notes (Signed)
Rebecca Boyd     MRN: [id number]      DOB: February 01, 1976   HPI Rebecca Boyd is here for follow up and re-evaluation of chronic medical conditions, medication management and review of any available recent lab and radiology data.  Preventive health is updated, specifically  Cancer screening and Immunization.   Questions or concerns regarding consultations or procedures which the PT has had in the interim are  addressed.Is being followed by pulmonary, Neurology and now a new pain clinic, feels as though she is doing better overall, reports a lot of stress at home with family, no exercise commitment yet and also weight loss not happening yet as not much change in food  choice and eating habits though working at this The PT denies any adverse reactions to current medications since the last visit.  C/o anaphylactic reaction to benadryl requiring hospitalization asa child, wants to know how to handle bee sting, will rx epi pen and refer for allergy testing, also has h/o svere generalized dermatis  ROS Denies recent fever or chills. Denies sinus pressure, nasal congestion, ear pain or sore throat. Denies chest congestion, productive cough or wheezing. Denies chest pains, palpitations and leg swelling Denies abdominal pain, nausea, vomiting,diarrhea or constipation.   Denies dysuria, frequency, hesitancy or incontinence. Denies uncontrolled joint pain, swelling and limitation in mobility. Denies headaches, seizures, numbness, or tingling. Denies uncontrolled depression, anxiety or insomnia.Managed by Psych Denies skin break down or rash.   PE  BP 121/87 (BP Location: Right Arm, Patient Position: Sitting, Cuff Size: Large)   Pulse 85   Ht 5' (1.524 m)   Wt 183 lb 1.3 oz (83 kg)   SpO2 96%   BMI 35.76 kg/m   Patient alert and oriented and in no cardiopulmonary distress.  HEENT: No facial asymmetry, EOMI,     Neck supple .  Chest: Clear to auscultation bilaterally.  CVS: S1, S2 no  murmurs, no S3.Regular rate.  ABD: Soft non tender.   Ext: No edema  MS: Adequate ROM spine, shoulders, hips and knees.  Skin: Intact, no ulcerations or rash noted.  Psych: Good eye contact, normal affect. Memory intact not anxious or depressed appearing.  CNS: CN 2-12 intact, power,  normal throughout.no focal deficits noted.   Assessment & Plan  Essential hypertension Controlled, no change in medication DASH diet and commitment to daily physical activity for a minimum of 30 minutes discussed and encouraged, as a part of hypertension management. The importance of attaining a healthy weight is also discussed.     08/01/2022    8:43 AM 07/11/2022    1:34 PM 07/07/2022    2:35 PM 07/07/2022    2:32 PM 07/07/2022    1:14 PM 07/04/2022    9:27 AM 06/15/2022   11:56 AM  BP/Weight  Systolic BP 121 142 96 90 139 144 126  Diastolic BP 87 80 59 54 83 87 84  Wt. (Lbs) 183.08 186   185 189.5   BMI 35.76 kg/m2 36.33 kg/m2   36.13 kg/m2 37.01 kg/m2        Migraine syndrome Managed by Neurology and controlled  Asthma Controlled, no change in medication, pulmonary also seeing pt since recent hospitalization   Depression, major, single episode, moderate (HCC) Treated by psych, fair control tho not at goal yet, most of her depression is linked to severe stress from  family  Dyslipidemia Hyperlipidemia:Low fat diet discussed and encouraged.   Lipid Panel  Lab Results  Component Value Date  CHOL 184 08/01/2022   HDL 52 08/01/2022   LDLCALC 109 (H) 08/01/2022   TRIG 130 08/01/2022   CHOLHDL 3.5 08/01/2022     Needs to reduce fat in diet  Morbid obesity due to excess calories [facility])  Patient re-educated about  the importance of commitment to a  minimum of 150 minutes of exercise per week as able.  The importance of healthy food choices with portion control discussed, as well as eating regularly and within a 12 hour window most days. The need to choose "clean , green" food 50  to 75% of the time is discussed, as well as to make water the primary drink and set a goal of 64 ounces water daily.       08/01/2022    8:43 AM 07/11/2022    1:34 PM 07/07/2022    1:14 PM  Weight /BMI  Weight 183 lb 1.3 oz 186 lb 185 lb  Height 5' (1.524 m) 5' (1.524 m) 5' (1.524 m)  BMI 35.76 kg/m2 36.33 kg/m2 36.13 kg/m2    Start phentermine 15 mg daily close f/u  Low vitamin D level Updated lab needed at/ before next visit. Level corected and within normal, OTC 1000 IU daily sufficient

## 2022-08-06 NOTE — Assessment & Plan Note (Signed)
Treated by psych, fair control tho not at goal yet, most of her depression is linked to severe stress from  family

## 2022-08-06 NOTE — Assessment & Plan Note (Signed)
  Patient re-educated about  the importance of commitment to a  minimum of 150 minutes of exercise per week as able.  The importance of healthy food choices with portion control discussed, as well as eating regularly and within a 12 hour window most days. The need to choose "clean , green" food 50 to 75% of the time is discussed, as well as to make water the primary drink and set a goal of 64 ounces water daily.       08/01/2022    8:43 AM 07/11/2022    1:34 PM 07/07/2022    1:14 PM  Weight /BMI  Weight 183 lb 1.3 oz 186 lb 185 lb  Height 5' (1.524 m) 5' (1.524 m) 5' (1.524 m)  BMI 35.76 kg/m2 36.33 kg/m2 36.13 kg/m2    Start phentermine 15 mg daily close f/u

## 2022-08-06 NOTE — Assessment & Plan Note (Signed)
Managed by Neurology and controlled °

## 2022-08-07 DIAGNOSIS — Z79899 Other long term (current) drug therapy: Secondary | ICD-10-CM | POA: Diagnosis not present

## 2022-08-10 ENCOUNTER — Ambulatory Visit (INDEPENDENT_AMBULATORY_CARE_PROVIDER_SITE_OTHER): Payer: BC Managed Care – PPO | Admitting: Orthopedic Surgery

## 2022-08-10 ENCOUNTER — Encounter: Payer: Self-pay | Admitting: Orthopedic Surgery

## 2022-08-10 ENCOUNTER — Other Ambulatory Visit (INDEPENDENT_AMBULATORY_CARE_PROVIDER_SITE_OTHER): Payer: BC Managed Care – PPO

## 2022-08-10 VITALS — BP 115/62 | HR 91 | Ht 60.0 in | Wt 187.0 lb

## 2022-08-10 DIAGNOSIS — G8929 Other chronic pain: Secondary | ICD-10-CM | POA: Diagnosis not present

## 2022-08-10 DIAGNOSIS — M25511 Pain in right shoulder: Secondary | ICD-10-CM | POA: Diagnosis not present

## 2022-08-10 DIAGNOSIS — M7541 Impingement syndrome of right shoulder: Secondary | ICD-10-CM

## 2022-08-10 MED ORDER — METHYLPREDNISOLONE ACETATE 40 MG/ML IJ SUSP
40.0000 mg | Freq: Once | INTRAMUSCULAR | Status: AC
  Administered 2022-08-10: 40 mg via INTRA_ARTICULAR

## 2022-08-10 NOTE — Progress Notes (Signed)
Chief Complaint  Patient presents with   Shoulder Pain    RT shoulder//painful x 2 months//pain is increasing/having trouble completing ADL's, sometimes she has to take other arm and lift her RT arm   Primary Visit Coverage  Payer      BLUE CROSS BLUE SHIELD        NEW PROBLEM   Chief complaint: Right shoulder pain x 2 months ...  She complains of pain in the anterior right shoulder.  She says it feels like something is missing in the front.  Occasionally she will have to use her left hand to lift her right arm  On exam she is tender across the front of the shoulder bicipital groove rotator interval  She has normal internal/external rotation and forward elevation in terms of range of motion she has good strength in abduction and flexion grade 5  However throughout the range of motion she has pain especially between 90 and 120 degrees she has a positive impingement sign  The x-ray is normal  Diagnosis impingement right shoulder  Encounter Diagnoses  Name Primary?   Chronic right shoulder pain Yes   Rotator cuff impingement syndrome of right shoulder     Procedure note the subacromial injection shoulder RIGHT    Verbal consent was obtained to inject the  RIGHT   Shoulder  Timeout was completed to confirm the injection site is a subacromial space of the  RIGHT  shoulder   Medication used Depo-Medrol 40 mg and lidocaine 1% 3 cc  Anesthesia was provided by ethyl chloride  The injection was performed in the RIGHT  posterior subacromial space. After pinning the skin with alcohol and anesthetized the skin with ethyl chloride the subacromial space was injected using a 20-gauge needle. There were no complications  Sterile dressing was applied.    At home exercise program  Recommend subacromial injection

## 2022-08-10 NOTE — Patient Instructions (Signed)
PAIN CONTROL   TYLENOL 500 MG EVERY 6 HRS   ADVIL 800 MG EVERY 8 HRS   HEAT 20-30 MIN AS NEEDED   BENGAY AS NEEDED

## 2022-08-10 NOTE — Addendum Note (Signed)
Addended by: Baird Kay on: 08/10/2022 10:07 AM   Modules accepted: Orders

## 2022-08-14 ENCOUNTER — Institutional Professional Consult (permissible substitution) (HOSPITAL_BASED_OUTPATIENT_CLINIC_OR_DEPARTMENT_OTHER): Payer: BC Managed Care – PPO | Admitting: Pulmonary Disease

## 2022-08-15 ENCOUNTER — Other Ambulatory Visit: Payer: Self-pay

## 2022-08-15 ENCOUNTER — Encounter: Payer: Self-pay | Admitting: Family Medicine

## 2022-08-15 MED ORDER — AMLODIPINE BESYLATE 10 MG PO TABS: 10.0000 mg | ORAL_TABLET | Freq: Every day | ORAL | 2 refills | Status: DC

## 2022-08-18 ENCOUNTER — Encounter: Payer: Self-pay | Admitting: Gastroenterology

## 2022-08-22 ENCOUNTER — Ambulatory Visit: Payer: BC Managed Care – PPO | Admitting: Urology

## 2022-08-28 ENCOUNTER — Encounter: Payer: Self-pay | Admitting: Internal Medicine

## 2022-08-28 ENCOUNTER — Ambulatory Visit: Payer: BC Managed Care – PPO | Admitting: Internal Medicine

## 2022-08-28 VITALS — BP 130/90 | HR 96 | Resp 16 | Ht 60.0 in | Wt 184.0 lb

## 2022-08-28 DIAGNOSIS — L819 Disorder of pigmentation, unspecified: Secondary | ICD-10-CM

## 2022-08-28 DIAGNOSIS — E876 Hypokalemia: Secondary | ICD-10-CM

## 2022-08-28 MED ORDER — BETAMETHASONE DIPROPIONATE 0.05 % EX CREA: TOPICAL_CREAM | Freq: Two times a day (BID) | CUTANEOUS | 0 refills | Status: DC

## 2022-08-28 NOTE — Patient Instructions (Signed)
Thank you, Ms.[name] for allowing [place] to provide your care today.   I have ordered the following labs for you:   Lab Orders         BMP8+EGFR        Referrals ordered today:    Referral Orders         Ambulatory referral to Dermatology       Reminders: I will follow up with results of your labs. I looked over my references and I do not have a good explanation for darkening of your hands. I am referring you to dermatology for evaluation.     Thurmon Fair, M.D.

## 2022-08-28 NOTE — Progress Notes (Signed)
   HPI:[name] is a 47 y.o. female who presents for evaluation of darkened skin.  For the details of today's visit, please refer to the assessment and plan.  Physical Exam: Vitals:   08/28/22 1451 08/28/22 1521  BP: (!) 133/91 (!) 130/90  Pulse: 96   Resp: 16   SpO2: 96%   Weight: 184 lb (83.5 kg)   Height: 5' (1.524 m)      Physical Exam Constitutional:      Appearance: She is well-developed and well-groomed.  Eyes:     General: No scleral icterus.    Conjunctiva/sclera: Conjunctivae normal.  Cardiovascular:     Rate and Rhythm: Normal rate and regular rhythm.     Heart sounds: No murmur heard.    No friction rub. No gallop.  Pulmonary:     Effort: Pulmonary effort is normal.     Breath sounds: No wheezing, rhonchi or rales.  Musculoskeletal:     Right lower leg: No edema.     Left lower leg: No edema.  Skin:    Comments: Hyperpigmented skin on dorsum of both hands, most noticeable starting below wrist down to tips of fingers.          Assessment & Plan:   Ziana was seen today for skin is discolored.  Hypokalemia Assessment & Plan: Patient has had low potassium on review of her labs. Her 3.4 at her last visit. She was given recommendations to increase foods rich in potassium.  She is here today because she has hyperpigmentation of both hands and has associated this with low potassium previously. I do not believe this is because of her hyperpigmentation.  We will repeat BMP today.  She is also on PPI and we will check her magnesium.  Not on any diuretics at this time  Orders: -     BMP8+EGFR -     Magnesium  Hyperpigmentation Assessment & Plan: Patient presents with concern for hyperpigmentation of both hands for 2 weeks. Also noticed it on her face, but it has improved. No other symptoms during this time. She has not been in the sun. No changes in medications. She had routine lab work earlier this month. She is not diabetic. She is prescribed  steroid cream and we discussed possibility of hypopigmentation of her arms. She only uses cream on small areas and has noticed change in hands only. No joint pain or other associated symptoms.   Referral to dermatology    Orders: -     Ambulatory referral to Dermatology  Other orders -     Betamethasone Dipropionate; Apply topically 2 (two) times daily.  Dispense: 45 g; Refill: 0      Milus Banister, MD

## 2022-08-28 NOTE — Assessment & Plan Note (Signed)
Patient has had low potassium on review of her labs. Her 3.4 at her last visit. She was given recommendations to increase foods rich in potassium.  She is here today because she has hyperpigmentation of both hands and has associated this with low potassium previously. I do not believe this is because of her hyperpigmentation.  We will repeat BMP today.  She is also on PPI and we will check her magnesium.  Not on any diuretics at this time

## 2022-08-28 NOTE — Assessment & Plan Note (Signed)
Patient presents with concern for hyperpigmentation of both hands for 2 weeks. Also noticed it on her face, but it has improved. No other symptoms during this time. She has not been in the sun. No changes in medications. She had routine lab work earlier this month. She is not diabetic. She is prescribed steroid cream and we discussed possibility of hypopigmentation of her arms. She only uses cream on small areas and has noticed change in hands only. No joint pain or other associated symptoms.   Referral to dermatology

## 2022-08-29 LAB — BMP8+EGFR
BUN/Creatinine Ratio: 11 (ref 9–23)
BUN: 9 mg/dL (ref 6–24)
CO2: 19 mmol/L — ABNORMAL LOW (ref 20–29)
Calcium: 9.7 mg/dL (ref 8.7–10.2)
Chloride: 104 mmol/L (ref 96–106)
Creatinine, Ser: 0.84 mg/dL (ref 0.57–1.00)
Glucose: 92 mg/dL (ref 70–99)
Potassium: 3.6 mmol/L (ref 3.5–5.2)
Sodium: 142 mmol/L (ref 134–144)
eGFR: 87 mL/min/{1.73_m2} (ref >59)

## 2022-08-29 LAB — MAGNESIUM: Magnesium: 1.9 mg/dL (ref 1.6–2.3)

## 2022-08-31 ENCOUNTER — Ambulatory Visit: Payer: BC Managed Care – PPO | Admitting: Family Medicine

## 2022-08-31 ENCOUNTER — Ambulatory Visit (INDEPENDENT_AMBULATORY_CARE_PROVIDER_SITE_OTHER): Payer: BC Managed Care – PPO | Admitting: Psychiatry

## 2022-08-31 DIAGNOSIS — F331 Major depressive disorder, recurrent, moderate: Secondary | ICD-10-CM | POA: Diagnosis not present
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
## 2022-08-31 NOTE — Addendum Note (Signed)
Addended by: Florencia Reasons E on: 08/31/2022 02:58 PM   Modules accepted: Level of Service

## 2022-09-04 ENCOUNTER — Other Ambulatory Visit: Payer: Self-pay

## 2022-09-04 ENCOUNTER — Ambulatory Visit (INDEPENDENT_AMBULATORY_CARE_PROVIDER_SITE_OTHER): Payer: BC Managed Care – PPO | Admitting: Allergy & Immunology

## 2022-09-04 ENCOUNTER — Encounter: Payer: Self-pay | Admitting: Allergy & Immunology

## 2022-09-04 VITALS — BP 130/84 | HR 95 | Temp 97.8°F | Resp 18 | Ht 60.63 in | Wt 184.8 lb

## 2022-09-04 DIAGNOSIS — J301 Allergic rhinitis due to pollen: Secondary | ICD-10-CM

## 2022-09-04 DIAGNOSIS — R221 Localized swelling, mass and lump, neck: Secondary | ICD-10-CM | POA: Diagnosis not present

## 2022-09-04 DIAGNOSIS — Z6834 Body mass index (BMI) 34.0-34.9, adult: Secondary | ICD-10-CM | POA: Diagnosis not present

## 2022-09-04 DIAGNOSIS — T63481D Toxic effect of venom of other arthropod, accidental (unintentional), subsequent encounter: Secondary | ICD-10-CM

## 2022-09-04 DIAGNOSIS — E559 Vitamin D deficiency, unspecified: Secondary | ICD-10-CM | POA: Diagnosis not present

## 2022-09-04 DIAGNOSIS — R220 Localized swelling, mass and lump, head: Secondary | ICD-10-CM

## 2022-09-04 DIAGNOSIS — M545 Low back pain, unspecified: Secondary | ICD-10-CM | POA: Diagnosis not present

## 2022-09-04 DIAGNOSIS — Z91040 Latex allergy status: Secondary | ICD-10-CM

## 2022-09-04 DIAGNOSIS — K219 Gastro-esophageal reflux disease without esophagitis: Secondary | ICD-10-CM

## 2022-09-04 DIAGNOSIS — Z79899 Other long term (current) drug therapy: Secondary | ICD-10-CM | POA: Diagnosis not present

## 2022-09-04 DIAGNOSIS — J4530 Mild persistent asthma, uncomplicated: Secondary | ICD-10-CM | POA: Diagnosis not present

## 2022-09-04 DIAGNOSIS — J310 Chronic rhinitis: Secondary | ICD-10-CM

## 2022-09-04 MED ORDER — QVAR REDIHALER 80 MCG/ACT IN AERB: 2.0000 | INHALATION_SPRAY | Freq: Two times a day (BID) | RESPIRATORY_TRACT | 5 refills | Status: DC

## 2022-09-04 NOTE — Addendum Note (Signed)
Addended by: Elsworth Soho on: 09/04/2022 05:15 PM   Modules accepted: Orders

## 2022-09-04 NOTE — Progress Notes (Signed)
NEW PATIENT  Date of Service/Encounter:  09/04/22  Consult requested by: Kerri Perches, MD   Assessment:   Mild persistent asthma, uncomplicated  Insect sting allergy - confirming with blood work  Chronic urticaria - mostly related to heat exposoure  Seasonal allergic rhinitis (grasses) - confirming with blood work  Orange allergy - negative skin testing (blood work pending)  Latex allergy - possible more related to the adhesive of the bandage, but we can consider testing in the future if indicated  GERD - on a PPI   Plan/Recommendations:   1. Insect sting allergy - We are going to get blood work to look for a stinging insect allergy given you reaction that included throat swelling. - We will also look for a serum tryptase to rule out mast cell disease (this is important to do because of your allergic reaction history). - We will call you in 1-2 weeks with the results of the testing.  - EpiPen training provided. - Anaphylaxis management plan provided.   2. Mild persistent asthma, uncomplicated - Lung testing looked awesome today. - I do want to start a daily controller medication since you are using your albuterol fairly frequently. - Spacer sample and demonstration provided. - Daily controller medication(s): Qvar 2 puffs once in the morning with spacer - Prior to physical activity: albuterol 2 puffs 10-15 minutes before physical activity. - Rescue medications: albuterol 4 puffs every 4-6 hours as needed - Changes during respiratory infections or worsening symptoms: Increase Qvar to 2 puffs twice daily for TWO WEEKS. - Asthma control goals:  * Full participation in all desired activities (may need albuterol before activity) * Albuterol use two time or less a week on average (not counting use with activity) * Cough interfering with sleep two time or less a month * Oral steroids no more than once a year * No hospitalizations  3. Chronic rhinitis -  Testing today showed: grasses - Copy of test results provided.  - Avoidance measures provided. - We will get blood work to confirm this. - Continue with: Zyrtec (cetirizine) 10mg  tablet once daily and Singulair (montelukast) 10mg  daily - We are not going to add anything else since oyu have not been on the cetirizine and montelukast for long enough to see whether it works.  - You can use an extra dose of the antihistamine, if needed, for breakthrough symptoms.  - Consider nasal saline rinses 1-2 times daily to remove allergens from the nasal cavities as well as help with mucous clearance (this is especially helpful to do before the nasal sprays are given)  4. Orange allergy - Skin testing was negative, but we are going to get blood work to confirm this. - EpiPen training provided.  - Anaphylaxis management plan provided.   5. Latex allergy - I wonder if this is related to the adhesive of the bandage instead of the latex itself. - We can do testing in the future if needed, but thankfully latex exposure is now rare because of the increased knowledge that this is a problem.  6. Return in about 8 weeks (around 10/30/2022). You can have the follow up appointment with Dr. Dellis Anes or a Nurse Practicioner (our Nurse Practitioners are excellent and always have Physician oversight!).     This note in its entirety was forwarded to the Provider who requested this consultation.  Subjective:   Rebecca Boyd is a 47 y.o. female presenting today for evaluation of  Chief Complaint  Patient presents  with   Allergy Testing    Benadryl-throat closure,citrus-hives-oranges,latex-hives/rash    Rebecca Boyd has a history of the following: Patient Active Problem List   Diagnosis Date Noted   Hyperpigmentation 08/28/2022   Nodule of buttock 06/19/2022   Abnormal CXR 05/07/2022   Asthma exacerbation 04/27/2022   Syncope 04/27/2022   Depression, major, single episode, moderate (HCC) 12/29/2021    GAD (generalized anxiety disorder) 12/29/2021   Left hip pain 10/02/2021   Dyslipidemia 08/25/2021   Prediabetes 08/25/2021   Low vitamin D level 07/07/2021   Elevated lipase 03/31/2021   FH: colon cancer 02/18/2021   Numbness and tingling of left hand 06/08/2020   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   Asthma 10/29/2019   Hypokalemia 07/11/2019   Family history of lung cancer    Rash and nonspecific skin eruption 05/14/2017   Urinary frequency 02/19/2017   Migraine syndrome 12/06/2016   Morbid obesity due to excess calories (HCC) 03/17/2015   Essential hypertension 01/07/2014   Flank pain 08/31/2013   Pain in thoracic spine 08/07/2013   Low back pain with radiation 07/30/2013   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  Rebecca Boyd was referred by Kerri Perches, MD.     Rebecca Boyd is a 47 y.o. female presenting for an evaluation of environmental allergies .   Asthma/Respiratory Symptom History: She does have a history of asthma. She has albuterol to use as needed in combination with her albuterol nebulizer. She uses the MDI twice a week and this worsens with the pollen exposure. She uses the nebulizer once monthly.  She sometimes get prednisone for her breathing. She was on it three times a day for 3 weeks. This was after an ED visit for PNA in December 2023. Those was the only time that she was on prednisone for her breathing predominantly. She was diagnosed with asthma years ago. She has never been admitted to the hospital and has never been intubated for her breathing.   **I did see Advair on her medication list AFTER the visit. We will see how Qvar does and we may go back to Advair if symptoms worsen.**  Allergic Rhinitis Symptom History: Her environmental allergies including scratchy  thorat and rhinorrhea and congestion. This is all year long but it worsens during the spring season. She has never tested for allergies. She did grow up in [place].  She is currently on cetirizine and montelukast. She started taking montelukast and cetirizine last week or two weeks ago; she is not sure that the montelukast is helping somewhat. She has Flonaser and she has been on that twice. She does have an adverse reaction to Benadryl (includes anaphylaxis).   Food Allergy Symptom History: She has anaphylaxis including hives and breathing problems with orange.   Stinging Insect Symptom History: She was stung by a bee when she was around [age] years old. She was given Benadryl and her throat swelled during that time. Her mother is the one who told her that the Benadryl made it worse.   Skin Symptom History: She has a label of a latex allergy.  This stems from a reaction she had following abdominal surgery.  She tells me that she reacted to the tape itself.  She is not sure whether it was the adhesive.  Since that time, she has avoided  any balloons, gloves, or anything that might be related to latex.  She also has a history of cholinergic urticaria. Hives never stick around for long at all and are not associated with fevers. She has chronic pain, so she is unsure of any connection with this pain.   Otherwise, there is no history of other atopic diseases, including drug allergies, stinging insect allergies, or contact dermatitis. There is no significant infectious history. Vaccinations are up to date.    Past Medical History: Patient Active Problem List   Diagnosis Date Noted   Hyperpigmentation 08/28/2022   Nodule of buttock 06/19/2022   Abnormal CXR 05/07/2022   Asthma exacerbation 04/27/2022   Syncope 04/27/2022   Depression, major, single episode, moderate (HCC) 12/29/2021   GAD (generalized anxiety disorder) 12/29/2021   Left hip pain 10/02/2021   Dyslipidemia 08/25/2021   Prediabetes  08/25/2021   Low vitamin D level 07/07/2021   Elevated lipase 03/31/2021   FH: colon cancer 02/18/2021   Numbness and tingling of left hand 06/08/2020   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   Asthma 10/29/2019   Hypokalemia 07/11/2019   Family history of lung cancer    Rash and nonspecific skin eruption 05/14/2017   Urinary frequency 02/19/2017   Migraine syndrome 12/06/2016   Morbid obesity due to excess calories (HCC) 03/17/2015   Essential hypertension 01/07/2014   Flank pain 08/31/2013   Pain in thoracic spine 08/07/2013   Low back pain with radiation 07/30/2013   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 If you have any questions, ask your nurse or doctor.          Aimovig 70 MG/ML Soaj Generic drug: Erenumab-aooe Inject 70 mg into the skin every 30 (thirty) days. What changed:  when to take this additional instructions   albuterol 108 (90 Base) MCG/ACT inhaler Commonly known as: VENTOLIN HFA Inhale 1-2 puffs into the lungs every 4 (four) hours as needed for wheezing or shortness of breath.   amLODipine 10 MG tablet Commonly known as: NORVASC Take 1 tablet (10 mg total) by mouth daily.   betamethasone dipropionate 0.05 % cream Apply topically 2 (two) times daily.   busPIRone 5 MG tablet Commonly known as: BUSPAR Take 1 tablet (5 mg total) by mouth 2 (two) times daily.   EPINEPHrine 0.3 mg/0.3 mL Soaj injection Commonly known as: EPI-PEN Inject 0.3 mg into the muscle as needed for anaphylaxis.   fluticasone 50 MCG/ACT nasal  spray Commonly known as: FLONASE Place 2 sprays into both nostrils daily.   fluticasone-salmeterol 500-50 MCG/ACT Aepb Commonly known as: ADVAIR Inhale 1 puff into the lungs in the morning and at bedtime. What changed:  when to take this reasons to take this   gabapentin 300 MG capsule Commonly known as: NEURONTIN Take 1 capsule (300 mg total) by mouth at bedtime.   methocarbamol 750 MG tablet Commonly known as: ROBAXIN Take 1 tablet (750 mg total) by mouth 4 (four) times daily.  montelukast 10 MG tablet Commonly known as: SINGULAIR Take 1 tablet (10 mg total) by mouth at bedtime.   oxyCODONE-acetaminophen 5-325 MG tablet Commonly known as: PERCOCET/ROXICET Take 1 tablet by mouth 2 (two) times daily as needed (pain.).   pantoprazole 40 MG tablet Commonly known as: PROTONIX Take 1 tablet (40 mg total) by mouth 2 (two) times daily.   phentermine 15 MG capsule Take 1 capsule (15 mg total) by mouth every morning.   Qvar RediHaler 80 MCG/ACT inhaler Generic drug: beclomethasone Inhale 2 puffs into the lungs 2 (two) times daily. Started by: Alfonse Spruce, MD   SUMAtriptan 6 MG/0.5ML Soln injection Commonly known as: Imitrex Use one injection at onset of migraine.  May repeat in 2 hrs, if needed.  Max dose: 2 inj/day. This is a 30 day prescription.   topiramate 100 MG tablet Commonly known as: TOPAMAX Take 1 tablet (100 mg total) by mouth 2 (two) times daily. What changed: when to take this        Birth History: non-contributory  Developmental History: non-contributory  Past Surgical History: Past Surgical History:  Procedure Laterality Date   BIOPSY  07/07/2022   Procedure: BIOPSY;  Surgeon: Lanelle Bal, DO;  [facility];  Service: Endoscopy;;   cervical cryotherapy N/A 1999   COLONOSCOPY WITH PROPOFOL N/A 05/10/2021   Procedure: COLONOSCOPY WITH PROPOFOL;  Surgeon: Lanelle Bal, DO;  [facility]: [facility];  Service: Endoscopy;   Laterality: N/A;  11:30am   DILITATION & CURRETTAGE/HYSTROSCOPY WITH THERMACHOICE ABLATION  08/26/2013   Procedure: DILATATION & CURETTAGE/HYSTEROSCOPY WITH THERMACHOICE ENDOMETRIAL ABLATION Procedure #2 Total Therapy Time=min       sec;  Surgeon: Tilda Burrow, MD;  Location: [facility];  Service: Gynecology;;   ESOPHAGOGASTRODUODENOSCOPY (EGD) WITH PROPOFOL N/A 07/07/2022   Procedure: ESOPHAGOGASTRODUODENOSCOPY (EGD) WITH PROPOFOL;  Surgeon: Lanelle Bal, DO;  [facility]: [facility];  Service: Endoscopy;  Laterality: N/A;  245pm, asa 2   LAPAROSCOPIC BILATERAL SALPINGECTOMY Bilateral 08/26/2013   Procedure: LAPAROSCOPIC BILATERAL SALPINGECTOMY AND REMOVAL OF LEFT PERITUBAL CYST Procedure #1;  Surgeon: Tilda Burrow, MD;  Location: [facility];  Service: Gynecology;  Laterality: Bilateral;   LAPAROSCOPIC LYSIS OF ADHESIONS  08/26/2013   Procedure: LAPAROSCOPIC LYSIS OF ADHESIONS Procedure #1;  Surgeon: Tilda Burrow, MD;  Location: [facility];  Service: Gynecology;;   POLYPECTOMY  05/10/2021   Procedure: POLYPECTOMY;  Surgeon: Lanelle Bal, DO;  [facility];  Service: Endoscopy;;   POLYPECTOMY  07/07/2022   Procedure: POLYPECTOMY;  Surgeon: Lanelle Bal, DO;  [facility];  Service: Endoscopy;;   VULVAR LESION REMOVAL  08/26/2013   Procedure: REMOVAL OF VULVAR SEBACEOUS CYST Procedure #3;  Surgeon: Tilda Burrow, MD;  Location: [facility];  Service: Gynecology;;     Family History: Family History  Problem Relation Age of Onset   Depression Mother    Drug abuse Mother    COPD Mother    Hypertension Father    Breast cancer Sister        dx 54   Colon cancer Maternal Aunt        62   Hypertension Maternal Grandmother    Hypertension Maternal Grandfather    Lung cancer Maternal Grandfather    Hypertension Paternal Grandmother    Breast cancer Paternal Grandmother        dx late 52s   Colon cancer Cousin 21       maternal  Social History: Diamantina lives at  home with her family.  She lives in a house.  There is carpeting throughout the home.  They have electric heating and send window units for cooling.  There is no tobacco exposure.  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  She has done this for 3 years.  There is exposure to fumes, chemicals, and dust.  There is no HEPA filter in the home.  She does not live near an interstate or industrial area.   Review of Systems  Constitutional: Negative.  Negative for chills, fever, malaise/fatigue and weight loss.  HENT:  Positive for congestion. Negative for ear discharge, ear pain and sinus pain.        Positive for postnasal drip. Positive for throat clearing.   Eyes:  Negative for pain, discharge and redness.  Respiratory:  Positive for cough. Negative for sputum production, shortness of breath and wheezing.   Cardiovascular: Negative.  Negative for chest pain and palpitations.  Gastrointestinal:  Negative for abdominal pain, heartburn, nausea and vomiting.  Skin: Negative.  Negative for itching and rash.  Neurological:  Negative for dizziness and headaches.  Endo/Heme/Allergies:  Positive for environmental allergies. Does not bruise/bleed easily.       Objective:   Blood pressure 130/84, pulse 95, temperature 97.8 F (36.6 C), temperature source Temporal, resp. rate 18, height 5' 0.63" (1.54 m), weight 184 lb 12.8 oz (83.8 kg), SpO2 95 %. Body mass index is 35.35 kg/m.     Physical Exam Vitals reviewed.  Constitutional:      Appearance: She is well-developed.     Comments: Pleasant and talkative.  HENT:     Head: Normocephalic and atraumatic.     Right Ear: Tympanic membrane, ear canal and external ear normal. No drainage, swelling or tenderness. Tympanic membrane is not injected, scarred, erythematous, retracted or bulging.     Left Ear: Tympanic membrane, ear canal and  external ear normal. No drainage, swelling or tenderness. Tympanic membrane is not injected, scarred, erythematous, retracted or bulging.     Nose: No nasal deformity, septal deviation, mucosal edema or rhinorrhea.     Right Turbinates: Enlarged, swollen and pale.     Left Turbinates: Enlarged, swollen and pale.     Right Sinus: No maxillary sinus tenderness or frontal sinus tenderness.     Left Sinus: No maxillary sinus tenderness or frontal sinus tenderness.     Mouth/Throat:     Mouth: Mucous membranes are not pale and not dry.     Pharynx: Uvula midline.  Eyes:     General:        Right eye: No discharge.        Left eye: No discharge.     Conjunctiva/sclera: Conjunctivae normal.     Right eye: Right conjunctiva is not injected. No chemosis.    Left eye: Left conjunctiva is not injected. No chemosis.    Pupils: Pupils are equal, round, and reactive to light.  Cardiovascular:     Rate and Rhythm: Normal rate and regular rhythm.     Heart sounds: Normal heart sounds.  Pulmonary:     Effort: Pulmonary effort is normal. No tachypnea, accessory muscle usage or respiratory distress.     Breath sounds: Normal breath sounds. No wheezing, rhonchi or rales.     Comments: Moving air well in all lung fields. No increased work of breathing noted.  Chest:  Chest wall: No tenderness.  Abdominal:     Tenderness: There is no abdominal tenderness. There is no guarding or rebound.  Lymphadenopathy:     Head:     Right side of head: No submandibular, tonsillar or occipital adenopathy.     Left side of head: No submandibular, tonsillar or occipital adenopathy.     Cervical: No cervical adenopathy.  Skin:    Coloration: Skin is not pale.     Findings: No abrasion, erythema, petechiae or rash. Rash is not papular, urticarial or vesicular.  Neurological:     Mental Status: She is alert.  Psychiatric:        Behavior: Behavior is cooperative.      Diagnostic studies:    Spirometry: results  normal (FEV1: 2.22/129%, FVC: 2.69/131%, FEV1/FVC: 83%).    Spirometry consistent with normal pattern.   Allergy Studies:     Airborne Adult Perc - 09/04/22 1054     Time Antigen Placed 1054    Allergen Manufacturer Waynette Buttery    Location Back    Number of Test 59    1. Control-Buffer 50% Glycerol Negative    2. Control-Histamine 1 mg/ml 2+    3. Albumin saline Negative    4. Bahia Negative    5. French Southern [place] Negative    6. Johnson Negative    7. Kentucky Blue 4+    8. Meadow Fescue 3+    9. Perennial Rye Negative    10. Sweet Vernal Negative    11. Timothy 2+    12. Cocklebur Negative    13. Burweed Marshelder Negative    14. Ragweed, short Negative    15. Ragweed, Giant Negative    16. Plantain,  English Negative    17. Lamb's Quarters Negative    18. Sheep Sorrell Negative    19. Rough Pigweed Negative    20. Marsh Elder, Rough Negative    21. Mugwort, Common Negative    22. Ash mix Negative    23. Birch mix Negative    24. Beech American Negative    25. Box, Elder Negative    26. Cedar, red Negative    27. Cottonwood, [place] Negative    28. Elm mix Negative    29. Hickory Negative    30. Maple mix Negative    31. Oak, [place] mix Negative    32. Pecan Pollen Negative    33. Pine mix Negative    34. Sycamore Eastern Negative    35. Walnut, Black Pollen Negative    36. Alternaria alternata Negative    37. Cladosporium Herbarum Negative    38. Aspergillus mix Negative    39. Penicillium mix Negative    40. Bipolaris sorokiniana (Helminthosporium) Negative    41. Drechslera spicifera (Curvularia) Negative    42. Mucor plumbeus Negative    43. Fusarium moniliforme Negative    44. Aureobasidium pullulans (pullulara) Negative    45. Rhizopus oryzae Negative    46. Botrytis cinera Negative    47. Epicoccum nigrum Negative    48. Phoma betae Negative    49. Candida Albicans Negative    50. Trichophyton mentagrophytes Negative    51. Mite, D Farinae  5,000 AU/ml  Negative    52. Mite, D Pteronyssinus  5,000 AU/ml Negative    53. Cat Hair 10,000 BAU/ml Negative    54.  Dog Epithelia Negative    55. Mixed Feathers Negative    56. Horse Epithelia Negative    57. Cockroach, [place] Negative  58. Mouse Negative    59. Tobacco Leaf Negative             Food Adult Perc - 09/04/22 1000     Time Antigen Placed 1055    Allergen Manufacturer Waynette Buttery    Location Back    Number of allergen test 1    56. Orange  Negative             Allergy testing results were read and interpreted by myself, documented by clinical staff.         Malachi Bonds, MD [facility]

## 2022-09-04 NOTE — Addendum Note (Signed)
Addended by: Elsworth Soho on: 09/04/2022 05:08 PM   Modules accepted: Orders

## 2022-09-04 NOTE — Patient Instructions (Addendum)
1. Insect sting allergy, current reaction, accidental or unintentional, subsequent encounter - We are going to get blood work to look for a stinging insect allergy given you reaction that included throat swelling. - We will also look for a serum tryptase to rule out mast cell disease (this is important to do because of your allergic reaction history). - We will call you in 1-2 weeks with the results of the testing.  - EpiPen training provided. - Anaphylaxis management plan provided.   2. Mild persistent asthma, uncomplicated - Lung testing looked awesome today. - I do want to start a daily controller medication since you are using your albuterol fairly frequently. - Spacer sample and demonstration provided. - Daily controller medication(s): Qvar 2 puffs once in the morning with spacer - Prior to physical activity: albuterol 2 puffs 10-15 minutes before physical activity. - Rescue medications: albuterol 4 puffs every 4-6 hours as needed - Changes during respiratory infections or worsening symptoms: Increase Qvar to 2 puffs twice daily for TWO WEEKS. - Asthma control goals:  * Full participation in all desired activities (may need albuterol before activity) * Albuterol use two time or less a week on average (not counting use with activity) * Cough interfering with sleep two time or less a month * Oral steroids no more than once a year * No hospitalizations  3. Chronic rhinitis - Testing today showed: grasses - Copy of test results provided.  - Avoidance measures provided. - We will get blood work to confirm this. - Continue with: Zyrtec (cetirizine) 10mg  tablet once daily and Singulair (montelukast) 10mg  daily - We are not going to add anything else since oyu have not been on the cetirizine and montelukast for long enough to see whether it works.  - You can use an extra dose of the antihistamine, if needed, for breakthrough symptoms.  - Consider nasal saline rinses 1-2 times  daily to remove allergens from the nasal cavities as well as help with mucous clearance (this is especially helpful to do before the nasal sprays are given)  4. Orange allergy - Skin testing was negative, but we are going to get blood work to confirm this. - EpiPen training provided.  - Anaphylaxis management plan provided.   5. Latex allergy - I wonder if this is related to the adhesive of the bandage instead of the latex itself. - We can do testing in the future if needed, but thankfully latex exposure is now rare because of the increased knowledge that this is a problem.  6. Return in about 8 weeks (around 10/30/2022). You can have the follow up appointment with Dr. Dellis Anes or a Nurse Practicioner (our Nurse Practitioners are excellent and always have Physician oversight!).    Please inform us of any Emergency Department visits, hospitalizations, or changes in symptoms. Call us before going to the ED for breathing or allergy symptoms since we might be able to fit you in for a sick visit. Feel free to contact us anytime with any questions, problems, or concerns.  It was a pleasure to meet you today!  Websites that have reliable patient information: 1. American Academy of Asthma, Allergy, and Immunology: www.aaaai.org 2. Food Allergy Research and Education (FARE): foodallergy.org 3. Mothers of Asthmatics: http://www.asthmacommunitynetwork.org 4. American College of Allergy, Asthma, and Immunology: www.acaai.org   COVID-19 Vaccine Information can be found at: [email or web address] For questions related to vaccine distribution or appointments, please email [email or web address] or call [phone].   We realize that you might be  concerned about having an allergic reaction to the COVID19 vaccines. To help with that concern, WE ARE OFFERING THE COVID19 VACCINES IN OUR OFFICE! Ask the front desk for dates!     "Like" [place] on Facebook  and Instagram for our latest updates!      A healthy democracy works best when [employer] participate! Make sure you are registered to vote! If you have moved or changed any of your contact information, you will need to get this updated before voting!  In some cases, you MAY be able to register to vote online: [email or web address]     Airborne Adult Perc - 09/04/22 1054     Time Antigen Placed 1054    Allergen Manufacturer Waynette Buttery    Location Back    Number of Test 59    1. Control-Buffer 50% Glycerol Negative    2. Control-Histamine 1 mg/ml 2+    3. Albumin saline Negative    4. Bahia Negative    5. [place] Negative    6. Johnson Negative    7. Kentucky Blue 4+    8. Meadow Fescue 3+    9. Perennial Rye Negative    10. Sweet Vernal Negative    11. Timothy 2+    12. Cocklebur Negative    13. Burweed Marshelder Negative    14. Ragweed, short Negative    15. Ragweed, Giant Negative    16. Plantain,  English Negative    17. Lamb's Quarters Negative    18. Sheep Sorrell Negative    19. Rough Pigweed Negative    20. Marsh Elder, Rough Negative    21. Mugwort, Common Negative    22. Ash mix Negative    23. Birch mix Negative    24. Beech American Negative    25. Box, Elder Negative    26. Cedar, red Negative    27. Cottonwood, [place] Negative    28. Elm mix Negative    29. Hickory Negative    30. Maple mix Negative    31. Oak, [place] mix Negative    32. Pecan Pollen Negative    33. Pine mix Negative    34. Sycamore Eastern Negative    35. Walnut, Black Pollen Negative    36. Alternaria alternata Negative    37. Cladosporium Herbarum Negative    38. Aspergillus mix Negative    39. Penicillium mix Negative    40. Bipolaris sorokiniana (Helminthosporium) Negative    41. Drechslera spicifera (Curvularia) Negative    42. Mucor plumbeus Negative    43. Fusarium moniliforme Negative    44. Aureobasidium pullulans (pullulara) Negative    45.  Rhizopus oryzae Negative    46. Botrytis cinera Negative    47. Epicoccum nigrum Negative    48. Phoma betae Negative    49. Candida Albicans Negative    50. Trichophyton mentagrophytes Negative    51. Mite, D Farinae  5,000 AU/ml Negative    52. Mite, D Pteronyssinus  5,000 AU/ml Negative    53. Cat Hair 10,000 BAU/ml Negative    54.  Dog Epithelia Negative    55. Mixed Feathers Negative    56. Horse Epithelia Negative    57. Cockroach, German Negative    58. Mouse Negative    59. Tobacco Leaf Negative             Food Adult Perc - 09/04/22 1000     Time Antigen Placed 1055    Allergen Manufacturer Waynette Buttery    Location Back  Number of allergen test 1    56. Orange  Negative             Reducing Pollen Exposure  The American Academy of Allergy, Asthma and Immunology suggests the following steps to reduce your exposure to pollen during allergy seasons.    Do not hang sheets or clothing out to dry; pollen may collect on these items. Do not mow lawns or spend time around freshly cut grass; mowing stirs up pollen. Keep windows closed at night.  Keep car windows closed while driving. Minimize morning activities outdoors, a time when pollen counts are usually at their highest. Stay indoors as much as possible when pollen counts or humidity is high and on windy days when pollen tends to remain in the air longer. Use air conditioning when possible.  Many air conditioners have filters that trap the pollen spores. Use a HEPA room air filter to remove pollen form the indoor air you breathe.

## 2022-09-07 ENCOUNTER — Telehealth: Payer: Self-pay | Admitting: Orthopedic Surgery

## 2022-09-07 ENCOUNTER — Encounter: Payer: Self-pay | Admitting: Orthopedic Surgery

## 2022-09-07 DIAGNOSIS — Z79899 Other long term (current) drug therapy: Secondary | ICD-10-CM | POA: Diagnosis not present

## 2022-09-07 NOTE — Telephone Encounter (Signed)
Yes ok 

## 2022-09-07 NOTE — Telephone Encounter (Signed)
Dr. Mort Sawyers pt - pt presented to the office requesting an out of work note from 09/01/22 - next appt on 09/11/22.  Please advise

## 2022-09-19 ENCOUNTER — Institutional Professional Consult (permissible substitution) (HOSPITAL_BASED_OUTPATIENT_CLINIC_OR_DEPARTMENT_OTHER): Payer: BC Managed Care – PPO | Admitting: Pulmonary Disease

## 2022-09-21 ENCOUNTER — Encounter: Payer: Self-pay | Admitting: Orthopedic Surgery

## 2022-09-21 ENCOUNTER — Ambulatory Visit (INDEPENDENT_AMBULATORY_CARE_PROVIDER_SITE_OTHER): Payer: BC Managed Care – PPO | Admitting: Orthopedic Surgery

## 2022-09-21 VITALS — BP 131/94 | HR 81 | Ht 60.0 in | Wt 187.4 lb

## 2022-09-21 DIAGNOSIS — M24011 Loose body in right shoulder: Secondary | ICD-10-CM

## 2022-09-21 DIAGNOSIS — G8929 Other chronic pain: Secondary | ICD-10-CM | POA: Diagnosis not present

## 2022-09-21 DIAGNOSIS — M7541 Impingement syndrome of right shoulder: Secondary | ICD-10-CM | POA: Diagnosis not present

## 2022-09-21 DIAGNOSIS — M25511 Pain in right shoulder: Secondary | ICD-10-CM

## 2022-09-21 NOTE — Progress Notes (Signed)
   BP (!) 131/94   Pulse 81   Ht 5' (1.524 m)   Wt 187 lb 6.4 oz (85 kg)   BMI 36.60 kg/m   Body mass index is 36.6 kg/m.  Chief Complaint  Patient presents with   Shoulder Pain    R shoulder, shot helped but feels like something is lose in the shoulder     DOI/DOS/DOLV Date: August 10, 2022  Improved NSAIDS and/or analgesics, supervised PT with diminished ADLs post treatment, and theraputic injections as appropriate   Today: 47 year old female status post injection right shoulder with some improvement patient still feels like something is loose in the shoulder.  Previous examination revealed the following  On exam she is tender across the front of the shoulder bicipital groove rotator interval   She has normal internal/external rotation and forward elevation in terms of range of motion she has good strength in abduction and flexion grade 5   However throughout the range of motion she has pain especially between 90 and 120 degrees she has a positive impingement sign   The x-ray is normal  Encounter Diagnoses  Name Primary?   Chronic right shoulder pain    Rotator cuff impingement syndrome of right shoulder    Loose body of shoulder, right Yes     Recommend MRI right shoulder rule out loose body

## 2022-09-27 ENCOUNTER — Encounter: Payer: Self-pay | Admitting: Family Medicine

## 2022-10-02 ENCOUNTER — Other Ambulatory Visit: Payer: Self-pay

## 2022-10-02 DIAGNOSIS — R21 Rash and other nonspecific skin eruption: Secondary | ICD-10-CM

## 2022-10-04 ENCOUNTER — Encounter (HOSPITAL_COMMUNITY): Payer: Self-pay | Admitting: Psychiatry

## 2022-10-04 ENCOUNTER — Ambulatory Visit (INDEPENDENT_AMBULATORY_CARE_PROVIDER_SITE_OTHER): Payer: BC Managed Care – PPO | Admitting: Psychiatry

## 2022-10-04 DIAGNOSIS — F15982 Other stimulant use, unspecified with stimulant-induced sleep disorder: Secondary | ICD-10-CM

## 2022-10-04 DIAGNOSIS — G4726 Circadian rhythm sleep disorder, shift work type: Secondary | ICD-10-CM | POA: Diagnosis not present

## 2022-10-04 DIAGNOSIS — Z6835 Body mass index (BMI) 35.0-35.9, adult: Secondary | ICD-10-CM | POA: Diagnosis not present

## 2022-10-04 DIAGNOSIS — F331 Major depressive disorder, recurrent, moderate: Secondary | ICD-10-CM | POA: Diagnosis not present

## 2022-10-04 DIAGNOSIS — E559 Vitamin D deficiency, unspecified: Secondary | ICD-10-CM

## 2022-10-04 DIAGNOSIS — F411 Generalized anxiety disorder: Secondary | ICD-10-CM

## 2022-10-04 DIAGNOSIS — Z789 Other specified health status: Secondary | ICD-10-CM | POA: Insufficient documentation

## 2022-10-04 DIAGNOSIS — Z79899 Other long term (current) drug therapy: Secondary | ICD-10-CM | POA: Diagnosis not present

## 2022-10-04 DIAGNOSIS — G4733 Obstructive sleep apnea (adult) (pediatric): Secondary | ICD-10-CM

## 2022-10-04 DIAGNOSIS — M545 Low back pain, unspecified: Secondary | ICD-10-CM

## 2022-10-04 DIAGNOSIS — M1990 Unspecified osteoarthritis, unspecified site: Secondary | ICD-10-CM | POA: Diagnosis not present

## 2022-10-04 HISTORY — DX: Other specified health status: Z78.9

## 2022-10-04 HISTORY — DX: Other stimulant use, unspecified with stimulant-induced sleep disorder: F15.982

## 2022-10-04 MED ORDER — LAMOTRIGINE 25 MG PO TABS: ORAL_TABLET | ORAL | 1 refills | Status: DC

## 2022-10-04 NOTE — Patient Instructions (Addendum)
We discontinued BuSpar today as it has not been providing much benefit.  We also identified excessive amounts of caffeine so if you can begin to cut back by half a can of soda every 5 days that should avoid the withdrawal headaches.  This should also help your insomnia as well as your anxiety.  We added Lamictal 25 mg nightly for 2 weeks to your regimen today.  Then increase to 50 mg nightly for 2 weeks then increase to 75 mg nightly for 2 weeks.  I will try to coordinate with your doctor to get the blood work that we talked about but also I would hold off on taking topiramate as this can have a drug interaction with Lamictal.
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
## 2022-10-09 ENCOUNTER — Encounter: Payer: Self-pay | Admitting: Family Medicine

## 2022-10-10 ENCOUNTER — Other Ambulatory Visit: Payer: Self-pay

## 2022-10-10 ENCOUNTER — Telehealth: Payer: Self-pay | Admitting: Family Medicine

## 2022-10-10 MED ORDER — METRONIDAZOLE 1.3 % VA GEL: VAGINAL | 1 refills | Status: AC

## 2022-10-10 NOTE — Telephone Encounter (Signed)
Rebecca Boyd with [employer] aware

## 2022-10-10 NOTE — Telephone Encounter (Signed)
Beth called from [facility] about prescription received metroNIDAZOLE 1.3 % GEL [[id number]]  clarification on the dose directions.  Call back # [phone].

## 2022-10-17 ENCOUNTER — Ambulatory Visit: Payer: BC Managed Care – PPO | Admitting: Family Medicine

## 2022-10-19 ENCOUNTER — Ambulatory Visit (INDEPENDENT_AMBULATORY_CARE_PROVIDER_SITE_OTHER): Payer: BC Managed Care – PPO | Admitting: Pulmonary Disease

## 2022-10-19 ENCOUNTER — Encounter (HOSPITAL_BASED_OUTPATIENT_CLINIC_OR_DEPARTMENT_OTHER): Payer: Self-pay | Admitting: Pulmonary Disease

## 2022-10-19 ENCOUNTER — Ambulatory Visit
Admission: RE | Admit: 2022-10-19 | Discharge: 2022-10-19 | Disposition: A | Discharge status: Home / Self Care | Payer: BC Managed Care – PPO | Source: Ambulatory Visit | Attending: Orthopedic Surgery | Admitting: Orthopedic Surgery

## 2022-10-19 VITALS — BP 126/82 | HR 75 | Ht 60.0 in | Wt 183.6 lb

## 2022-10-19 DIAGNOSIS — J4540 Moderate persistent asthma, uncomplicated: Secondary | ICD-10-CM | POA: Diagnosis not present

## 2022-10-19 DIAGNOSIS — M25511 Pain in right shoulder: Secondary | ICD-10-CM | POA: Diagnosis not present

## 2022-10-19 DIAGNOSIS — G8929 Other chronic pain: Secondary | ICD-10-CM | POA: Diagnosis not present

## 2022-10-19 DIAGNOSIS — M7541 Impingement syndrome of right shoulder: Secondary | ICD-10-CM

## 2022-10-19 DIAGNOSIS — M24011 Loose body in right shoulder: Secondary | ICD-10-CM

## 2022-10-19 NOTE — Progress Notes (Signed)
[name]  Chief Complaint  Patient presents with   Consult    Referred by PCP for history of asthma. States she had her first asthma attack back in December 2023. Denies any additional asthma attacks since then.     Past Surgical History:  She  has a past surgical history that includes cervical cryotherapy (N/A, 1999); Laparoscopic bilateral salpingectomy (Bilateral, 08/26/2013); Dilatation & currettage/hysteroscopy with thermachoice ablation (08/26/2013); Vulvar lesion removal (08/26/2013); Laparoscopic lysis of adhesions (08/26/2013); Colonoscopy with propofol (N/A, 05/10/2021); polypectomy (05/10/2021); Esophagogastroduodenoscopy (egd) with propofol (N/A, 07/07/2022); biopsy (07/07/2022); and polypectomy (07/07/2022).  Past Medical History:  Anemia, Anxiety, Neck pain, Depression, HA, HTN, Urticaria  Constitutional:  BP 126/82   Pulse 75   Ht 5' (1.524 m)   Wt 183 lb 9.6 oz (83.3 kg)   SpO2 98% Comment: on RA  BMI 35.86 kg/m   Brief Summary:  Rebecca Boyd is a 47 y.o. female former smoker with asthma.      Subjective:   I saw her in 2021 for assessment of sleep apnea.  Her sleep study showed mild obstructive sleep apnea.  She was seen more recently by Dr. Dellis Anes for asthma, urticaria and allergic rhinitis.  She has know about having asthma for years.  She had pneumonia in December 2023 and had a bad asthma attack.  Chest xray showed bandlike opacities.  She had CT chest in January 2024 and this showed stable small lung nodules, but no other significant findings.  She has occasional cough but is usually dry.  Not having wheeze, chest pain, fever, or hemoptysis.  She uses albuterol twice per week.  Has occasional allergies with post nasal drip.  Doesn't feel like her breathing limits her activity.  She quit smoking several years ago.  Sleeping okay.  She tried CPAP before, but wasn't able to tolerate the mask or pressure.  Physical  Exam:   Appearance - well kempt   ENMT - no sinus tenderness, no oral exudate, no LAN, Mallampati 3 airway, no stridor. 2+ tonsils  Respiratory - equal breath sounds bilaterally, no wheezing or rales  CV - s1s2 regular rate and rhythm, no murmurs  Ext - no clubbing, no edema  Skin - no rashes  Psych - normal mood and affect   Pulmonary testing:  Allergy testing 09/04/22 >> Blue grass, Fescue, Timothy grass  Chest Imaging:  CT chest 05/27/22 >> 4 mm nodule RUL, 5 mm nodule RLL, 4 mm nodule LLL all stable since 2017  Sleep Tests:  HST 04/21/20 >> AHI 6.9, SpO2 low 87%   Cardiac Tests:    Social History:  She  reports that she quit smoking about 5 years ago. Her smoking use included cigars and cigarettes. She has never used smokeless tobacco. She reports current alcohol use. She reports that she does not currently use drugs.  Family History:  Her family history includes Breast cancer in her paternal grandmother and sister; COPD in her mother; Colon cancer in her maternal aunt; Colon cancer (age of onset: 33) in her cousin; Depression in her mother; Drug abuse in her mother; Hypertension in her father, maternal grandfather, maternal grandmother, and paternal grandmother; Lung cancer in her maternal grandfather.     Assessment/Plan:   Moderate, persistent asthma with allergic phenotype. - continue Qvar 80 two puffs bid - she uses montelukast as needed when her symptoms get worse - prn albuterol - will arrange for pulmonary function test  Allergic rhinitis. -  prn flonase, montelukast - followed by Dr. Malachi Bonds with [facility]  Lung nodules. - these have been stable since 2017 and can be considered benign - no additional radiographic follow up needed for these  Obstructive sleep apnea. - she was not able to tolerate previous trial of CPAP - she doesn't feel like she is having any issues with her sleep at present  Time Spent Involved in Patient Care on Day  of Examination:  50 minutes  Follow up:   Patient Instructions  Will schedule pulmonary function test at [facility]  Follow up in 3 months  Medication List:   Allergies as of 10/19/2022       Reactions   Diphenhydramine Hcl Anaphylaxis   (Benadryl)   Orange Fruit Hives, Shortness Of Breath, Itching, Other (See Comments)   MAKES IT DIFFICULT TO BREATH   Latex Rash        Medication List        Accurate as of October 19, 2022 11:12 AM. If you have any questions, ask your nurse or doctor.          Aimovig 70 MG/ML Soaj Generic drug: Erenumab-aooe Inject 70 mg into the skin every 30 (thirty) days. What changed:  when to take this additional instructions   albuterol 108 (90 Base) MCG/ACT inhaler Commonly known as: VENTOLIN HFA Inhale 1-2 puffs into the lungs every 4 (four) hours as needed for wheezing or shortness of breath.   amLODipine 10 MG tablet Commonly known as: NORVASC Take 1 tablet (10 mg total) by mouth daily.   betamethasone dipropionate 0.05 % cream Apply topically 2 (two) times daily.   EPINEPHrine 0.3 mg/0.3 mL Soaj injection Commonly known as: EPI-PEN Inject 0.3 mg into the muscle as needed for anaphylaxis.   fluticasone 50 MCG/ACT nasal spray Commonly known as: FLONASE Place 2 sprays into both nostrils daily.   gabapentin 300 MG capsule Commonly known as: NEURONTIN Take 1 capsule (300 mg total) by mouth at bedtime.   lamoTRIgine 25 MG tablet Commonly known as: LaMICtal Take 1 tablet by mouth nightly for 2 weeks then increase to 2 tablets nightly for 2 weeks then increase to 3 tablets nightly for 2 weeks.   methocarbamol 750 MG tablet Commonly known as: ROBAXIN Take 1 tablet (750 mg total) by mouth 4 (four) times daily.   montelukast 10 MG tablet Commonly known as: SINGULAIR Take 1 tablet (10 mg total) by mouth at bedtime.   oxyCODONE-acetaminophen 5-325 MG tablet Commonly known as: PERCOCET/ROXICET Take 1 tablet by mouth 2  (two) times daily as needed (pain.).   pantoprazole 40 MG tablet Commonly known as: PROTONIX Take 1 tablet (40 mg total) by mouth 2 (two) times daily.   Qvar RediHaler 80 MCG/ACT inhaler Generic drug: beclomethasone Inhale 2 puffs into the lungs 2 (two) times daily.   SUMAtriptan 6 MG/0.5ML Soln injection Commonly known as: Imitrex Use one injection at onset of migraine.  May repeat in 2 hrs, if needed.  Max dose: 2 inj/day. This is a 30 day prescription.   topiramate 100 MG tablet Commonly known as: TOPAMAX Take 1 tablet (100 mg total) by mouth 2 (two) times daily. What changed: when to take this        Signature:  Coralyn Helling, MD [facility] Pager - [phone] 10/19/2022, 11:12 AM

## 2022-10-19 NOTE — Patient Instructions (Signed)
Will schedule pulmonary function test at [facility]  Follow up in 3 months

## 2022-10-24 ENCOUNTER — Ambulatory Visit (HOSPITAL_COMMUNITY): Payer: BC Managed Care – PPO | Admitting: Psychiatry

## 2022-10-24 ENCOUNTER — Telehealth (HOSPITAL_COMMUNITY): Payer: Self-pay | Admitting: Psychiatry

## 2022-10-24 ENCOUNTER — Encounter (HOSPITAL_COMMUNITY): Payer: Self-pay

## 2022-10-24 NOTE — Telephone Encounter (Signed)
Therapist attempted to contact patient via phone regarding scheduled in office appointment and received voicemail recording.  Therapist left message indicating attempt and requesting patient call office. 

## 2022-11-01 DIAGNOSIS — R030 Elevated blood-pressure reading, without diagnosis of hypertension: Secondary | ICD-10-CM | POA: Diagnosis not present

## 2022-11-01 DIAGNOSIS — M545 Low back pain, unspecified: Secondary | ICD-10-CM | POA: Diagnosis not present

## 2022-11-01 DIAGNOSIS — G8929 Other chronic pain: Secondary | ICD-10-CM | POA: Diagnosis not present

## 2022-11-01 DIAGNOSIS — Z6834 Body mass index (BMI) 34.0-34.9, adult: Secondary | ICD-10-CM | POA: Diagnosis not present

## 2022-11-01 DIAGNOSIS — M1990 Unspecified osteoarthritis, unspecified site: Secondary | ICD-10-CM | POA: Diagnosis not present

## 2022-11-03 ENCOUNTER — Telehealth (INDEPENDENT_AMBULATORY_CARE_PROVIDER_SITE_OTHER): Payer: BC Managed Care – PPO | Admitting: Psychiatry

## 2022-11-03 ENCOUNTER — Encounter (HOSPITAL_COMMUNITY): Payer: Self-pay | Admitting: Psychiatry

## 2022-11-03 DIAGNOSIS — G4733 Obstructive sleep apnea (adult) (pediatric): Secondary | ICD-10-CM

## 2022-11-03 DIAGNOSIS — F15982 Other stimulant use, unspecified with stimulant-induced sleep disorder: Secondary | ICD-10-CM

## 2022-11-03 DIAGNOSIS — G4726 Circadian rhythm sleep disorder, shift work type: Secondary | ICD-10-CM

## 2022-11-03 DIAGNOSIS — F411 Generalized anxiety disorder: Secondary | ICD-10-CM

## 2022-11-03 DIAGNOSIS — G43909 Migraine, unspecified, not intractable, without status migrainosus: Secondary | ICD-10-CM

## 2022-11-03 DIAGNOSIS — Z789 Other specified health status: Secondary | ICD-10-CM | POA: Diagnosis not present

## 2022-11-03 DIAGNOSIS — F331 Major depressive disorder, recurrent, moderate: Secondary | ICD-10-CM

## 2022-11-03 DIAGNOSIS — E559 Vitamin D deficiency, unspecified: Secondary | ICD-10-CM

## 2022-11-03 MED ORDER — LAMOTRIGINE 100 MG PO TABS: 100.0000 mg | ORAL_TABLET | Freq: Every evening | ORAL | 2 refills | Status: DC

## 2022-11-03 NOTE — Patient Instructions (Addendum)
We increased the lamotrigine (Lamictal) to 3 pills (total of 75 mg) nightly for the next 2 weeks and once you run out of these you can start the new prescription of 100 mg nightly.  If you could reach out to your PCP to obtain vitamin D, B12, folate level, iron panel to rule out other physical causes that might be impacting I feel that would be a good.  Keep of the good work with cutting back on caffeine and remember the goal is 1 unit (this can be a can) of caffeine per day no later than what 12 PM would be in your sleep cycle.  Let me know if the itching sensation gets worse as we increased the dose of the Lamictal.
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
## 2022-11-07 ENCOUNTER — Ambulatory Visit (INDEPENDENT_AMBULATORY_CARE_PROVIDER_SITE_OTHER): Payer: BC Managed Care – PPO | Admitting: Psychiatry

## 2022-11-07 ENCOUNTER — Encounter (HOSPITAL_COMMUNITY): Payer: Self-pay

## 2022-11-07 DIAGNOSIS — F331 Major depressive disorder, recurrent, moderate: Secondary | ICD-10-CM

## 2022-11-07 NOTE — Progress Notes (Signed)
Virtual Visit via Telephone Note Attempted to do video visit but technical difficulties prevented from completing,  I connected with Rebecca Boyd on 11/07/22 at 11:00 AM EDT by telephone and verified that I am speaking with the correct person using two identifiers.  Location: Patient: Home Provider: [facility] [place] office    I discussed the limitations, risks, security and privacy concerns of performing an evaluation and management service by telephone and the availability of in person appointments. I also discussed with the patient that there may be a patient responsible charge related to this service. The patient expressed understanding and agreed to proceed.   I provided 38 minutes of non-face-to-face time during this encounter.   Adah Salvage, LCSW    THERAPIST PROGRESS NOTE  Session Time:  Tuesday 11/07/2022 11:20 AM - 11:58 AM   Participation Level: Active  Behavioral Response: AlertAnxious  Type of Therapy: Individual Therapy  Treatment Goals addressed: [place] will score less than 5 on the Generalized Anxiety Disorder 7 Scale (GAD-7  Pt will learn 3 relaxation techniques and practice a relaxation technique daily  ProgressTowards Goals: Initial  Interventions: CBT and Supportive  Summary: Rebecca Boyd is a 47 y.o. female who presents with  Pt is referred for services by PCP Dr. Lodema Hong due to pt experiencing symptoms of anxiety and depression. She is a returning pt to this clinician and last was seen in 2022, She also saw psychiatrist Dr. Vanetta Shawl  in the past. She denies any psychiatric hospitalizations.  Patient reports crying a lot, spending money she does not have becoming stressed out about how to pay her bills.  She also reports grief and loss issues regarding her aunt dying in July 2023.  Per patient's report this and use to help her and was her backbone.  She also reports grief and loss issues regarding her boyfriend dying of cancer in November  2022.  Symptoms include crying spells, anxiety, excessive worry, mood swings, irritability, restlessness, and overconfidence   Patient last was seen about 2 months ago for the assessment appointment.  She reports little to no change in symptoms since that time.  She reports additional grief and loss issues as her uncle died in 2022-11-04.  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  She expresses appropriate sadness and says she is coping okay with this.  She reports now mainly being concerned about anxiety and stress and says she becomes irritable very quickly and then will isolate self.  She reports she is taking medication as prescribed by psychiatrist Dr. Adrian Blackwater.  She is working with him regarding the side effects of itching.  However, she reports she has become more calm since taking the medication.  She also reports she has been trying to reduce caffeine use as instructed by Dr. Adrian Blackwater and says she now drinks about 32 ounces of [facility] per day.   Suicidal/Homicidal: Nowithout intent/plan  Therapist Response: Reviewed symptoms, administered PHQ 2 and 9 and GAD-7, discussed results, discussed stressors, facilitated expression of thoughts and feelings, validated feelings and normalized feelings related to grief and loss, facilitated patient sharing narrative of her uncle's death, developed treatment plan, obtained patient's permission to electronically signed plan for patient as this was a virtual visit, sent copy of treatment plan to patient via MyChart, urged patient to follow through with her efforts to reduce caffeine use  Plan: Return again in 2 weeks.  Diagnosis: Moderate  episode of recurrent major depressive disorder (HCC)  Collaboration of Care: Psychiatrist AEB patient sees psychiatrist Dr. Gust Rung in this practice  Patient/Guardian was advised Release of Information must be obtained prior to any record release in order to  collaborate their care with an outside provider. Patient/Guardian was advised if they have not already done so to contact the registration department to sign all necessary forms in order for [place] to release information regarding their care.   Consent: Patient/Guardian gives verbal consent for treatment and assignment of benefits for services provided during this visit. Patient/Guardian expressed understanding and agreed to proceed.   Adah Salvage, LCSW 11/07/2022

## 2022-11-08 ENCOUNTER — Ambulatory Visit: Payer: BC Managed Care – PPO | Admitting: Family Medicine

## 2022-11-08 DIAGNOSIS — M5416 Radiculopathy, lumbar region: Secondary | ICD-10-CM | POA: Diagnosis not present

## 2022-11-08 NOTE — Progress Notes (Deleted)
   [address] [place] [address] Dept: [phone]  FOLLOW UP NOTE  Patient ID: Rebecca Boyd, female    DOB: [birth year]  Age: 47 y.o. MRN: [id number] Date of Office Visit: 11/08/2022  Assessment  Chief Complaint: No chief complaint on file.  HPI Rebecca Boyd is a 47 year old female who presents to the clinic for follow-up visit.  She was last seen in this clinic on 09/04/2022 by Dr. 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  Her last environmental allergy testing was on 09/04/2022 and was positive to grass pollen.  She did not get the labs that were ordered at her last visit   Drug Allergies:  Allergies  Allergen Reactions   Diphenhydramine Hcl Anaphylaxis    (Benadryl)   Orange Fruit Hives, Shortness Of Breath, Itching and Other (See Comments)    MAKES IT DIFFICULT TO BREATH   Latex Rash    Physical Exam: There were no vitals taken for this visit.   Physical Exam  Diagnostics:    Assessment and Plan: No diagnosis found.  No orders of the defined types were placed in this encounter.   There are no Patient Instructions on file for this visit.  No follow-ups on file.    Thank you for the opportunity to care for this patient.  Please do not hesitate to contact me with questions.  Thermon Leyland, FNP [facility]

## 2022-11-09 ENCOUNTER — Encounter: Payer: Self-pay | Admitting: Orthopedic Surgery

## 2022-11-09 ENCOUNTER — Ambulatory Visit (INDEPENDENT_AMBULATORY_CARE_PROVIDER_SITE_OTHER): Payer: BC Managed Care – PPO | Admitting: Orthopedic Surgery

## 2022-11-09 VITALS — BP 136/88 | HR 89 | Ht 60.0 in | Wt 183.0 lb

## 2022-11-09 DIAGNOSIS — M25511 Pain in right shoulder: Secondary | ICD-10-CM | POA: Diagnosis not present

## 2022-11-09 DIAGNOSIS — G8929 Other chronic pain: Secondary | ICD-10-CM | POA: Diagnosis not present

## 2022-11-09 MED ORDER — PREDNISONE 10 MG PO TABS: 10.0000 mg | ORAL_TABLET | Freq: Three times a day (TID) | ORAL | 0 refills | Status: DC

## 2022-11-09 MED ORDER — HYDROCODONE-ACETAMINOPHEN 5-325 MG PO TABS: 1.0000 | ORAL_TABLET | Freq: Four times a day (QID) | ORAL | 0 refills | Status: DC | PRN

## 2022-11-09 NOTE — Patient Instructions (Signed)
Rest   Medication   Therapy   Call us after your 15th PT visit to schedule follow up visit

## 2022-11-09 NOTE — Progress Notes (Signed)
Chief Complaint  Patient presents with   Shoulder Pain    MRI RESULTS RIGHT SHOULDER   Encounter Diagnosis  Name Primary?   Chronic right shoulder pain/quadrilateral space syndrome Yes    My interpretation of the imaging:  MRI findings indicate quadrilateral space syndrome  There is some tendinosis in the shoulder as well.  The patient is still having significant pain in the right shoulder  She uses a pressure hose at work and this may be the cause of her symptoms  Imaging there is increased edema in the distal clavicle, interstitial tears of the rotator cuff with nonsurgical biceps tendinosis and atrophy of the musculature of the teres minor  Encounter Diagnosis  Name Primary?   Chronic right shoulder pain/quadrilateral space syndrome Yes    Rec:  Rest/ OOW 6 weeks   Steroids f/b nsaids  PT   Judicious opioids  Meds ordered this encounter  Medications   predniSONE (DELTASONE) 10 MG tablet    Sig: Take 1 tablet (10 mg total) by mouth 3 (three) times daily.    Dispense:  42 tablet    Refill:  0   HYDROcodone-acetaminophen (NORCO/VICODIN) 5-325 MG tablet    Sig: Take 1 tablet by mouth every 6 (six) hours as needed for moderate pain.    Dispense:  30 tablet    Refill:  0   FINDINGS: Rotator cuff: There is thin linear fluid bright longitudinal signal within the mid to anterior supraspinatus tendon footprint in a region measuring up to 9 mm along the length of the tendon and 5 mm in AP dimension (coronal series 104, images 13 and 14), likely two thin linear interstitial tears without tendon retraction. Mild intermediate T2 signal tendinosis of the posterior supraspinatus and anterior infraspinatus interdigitating tendon fibers. The subscapularis and teres minor are intact.   Muscles: There is mild-to-moderate atrophy of the teres minor with mild diffuse teres minor muscle edema. No space-occupying lesion is seen within the quadrilateral space on this exam.    Biceps long head: Mild intermediate T2 signal and thickening of the proximal horizontal long head of the biceps tendon.   Acromioclavicular Joint: There are moderate degenerative changes of the acromioclavicular joint including joint space narrowing, high-grade clavicular head greater than adjacent acromion subchondral marrow edema, and peripheral osteophytosis. Type II acromion. There superimposed mild distal lateral subacromial spurs. Mild fluid within the subacromial/subdeltoid bursa.   Glenohumeral Joint: Moderate glenohumeral cartilage thinning.   Labrum: Lack of intra-articular fluid limits evaluation of the glenoid labrum. There is linear fluid bright signal and air present a thin linear tear within the peripheral aspect of the posterosuperior labrum (axial series 103 images tendon 11). There is a 3 x 8 x 6 mm (transverse by craniocaudal) fluid bright focus bordering the anterior superior glenoid, deep to the labrum. This is nonspecific and may represent a tiny paralabral cyst or ganglion (sagittal image 12, axial image 10, and coronal image 14).   Bones:  No acute fracture.   Other: None.   IMPRESSION: 1. Two thin linear interstitial tears within the mid to anterior supraspinatus tendon footprint. No tendon retraction. 2. Mild-to-moderate atrophy of the teres minor with mild diffuse teres minor muscle edema. This can be seen with acute on chronic quadrilateral space syndrome. No space-occupying lesion is seen within the quadrilateral space on this exam. 3. Mild proximal horizontal long head of the biceps tendinosis. 4. Moderate degenerative changes of the acromioclavicular joint. This includes high-grade clavicular head greater than adjacent acromion subchondral marrow  edema suggesting this may represent a source of pain. 5. Mild subacromial/subdeltoid bursitis.     Electronically Signed   By: Neita Garnet M.D.   On: 10/24/2022 20:09

## 2022-11-09 NOTE — Addendum Note (Signed)
Addended by: Michaele Offer on: 11/09/2022 03:02 PM   Modules accepted: Orders

## 2022-11-10 ENCOUNTER — Other Ambulatory Visit: Payer: Self-pay | Admitting: Neurosurgery

## 2022-11-10 DIAGNOSIS — M5416 Radiculopathy, lumbar region: Secondary | ICD-10-CM

## 2022-11-13 ENCOUNTER — Ambulatory Visit
Admission: RE | Admit: 2022-11-13 | Discharge: 2022-11-13 | Disposition: A | Discharge status: Home / Self Care | Payer: BC Managed Care – PPO | Source: Ambulatory Visit | Attending: Neurosurgery | Admitting: Neurosurgery

## 2022-11-13 DIAGNOSIS — M5124 Other intervertebral disc displacement, thoracic region: Secondary | ICD-10-CM | POA: Diagnosis not present

## 2022-11-13 DIAGNOSIS — M5125 Other intervertebral disc displacement, thoracolumbar region: Secondary | ICD-10-CM | POA: Diagnosis not present

## 2022-11-13 DIAGNOSIS — M5416 Radiculopathy, lumbar region: Secondary | ICD-10-CM

## 2022-11-13 DIAGNOSIS — M47816 Spondylosis without myelopathy or radiculopathy, lumbar region: Secondary | ICD-10-CM | POA: Diagnosis not present

## 2022-11-13 NOTE — Addendum Note (Signed)
Addended byCaffie Damme on: 11/13/2022 11:19 AM   Modules accepted: Orders

## 2022-11-20 ENCOUNTER — Other Ambulatory Visit: Payer: Self-pay

## 2022-11-20 ENCOUNTER — Encounter: Payer: Self-pay | Admitting: Family Medicine

## 2022-11-20 MED ORDER — BETAMETHASONE DIPROPIONATE 0.05 % EX CREA: TOPICAL_CREAM | Freq: Two times a day (BID) | CUTANEOUS | 0 refills | Status: DC

## 2022-11-20 NOTE — Telephone Encounter (Signed)
Refills sent

## 2022-11-21 ENCOUNTER — Ambulatory Visit (INDEPENDENT_AMBULATORY_CARE_PROVIDER_SITE_OTHER): Payer: BC Managed Care – PPO | Admitting: Psychiatry

## 2022-11-21 DIAGNOSIS — F331 Major depressive disorder, recurrent, moderate: Secondary | ICD-10-CM | POA: Diagnosis not present

## 2022-11-21 NOTE — Progress Notes (Signed)
IN-PERSON  THERAPIST PROGRESS NOTE  Session Time:  Tuesday 11/07/2022 11:14 AM - 12:05 PM   Participation Level: Active  Behavioral Response: AlertAnxious  Type of Therapy: Individual Therapy  Treatment Goals addressed: [name] will score less than 5 on the Generalized Anxiety Disorder 7 Scale (GAD-7  Pt will learn 3 relaxation techniques and practice a relaxation technique daily    ProgressTowards Goals: Not progressing   Interventions: CBT and Supportive  Summary: Rebecca Boyd is a 47 y.o. female who presents with  Pt is referred for services by PCP Dr. Lodema Hong due to pt experiencing symptoms of anxiety and depression. She is a returning pt to this clinician and last was seen in 2022, She also saw psychiatrist Dr. Vanetta Shawl  in the past. She denies any psychiatric hospitalizations.  Patient reports crying a lot, spending money she does not have becoming stressed out about how to pay her bills.  She also reports grief and loss issues regarding her aunt dying in July 2023.  Per patient's report this and use to help her and was her backbone.  She also reports grief and loss issues regarding her boyfriend dying of cancer in November 2022.  Symptoms include crying spells, anxiety, excessive worry, mood swings, irritability, restlessness, and overconfidence   Patient last was seen about 2- 3 weeks ago.  She continues to experience symptoms of anxiety as reflected in the GAD-7.  She reports continued nervousness but has difficulty identifying triggers of anxiety and worry.  She reports she has been practicing deep breathing regularly.  She has continued to try to reduce caffeine use and states drinking about 3 small cans of Mountain Dew per day.  She reports continued medication compliance and says she still is experiencing side effects of itching.  She will discuss with psychiatrist Dr. Adrian Blackwater during her next medication management appointment tomorrow.  Patient reports continued concerns  about excessive/impulsive spending and having difficulty paying her bills as a result.  She reports recent example of buying shoes instead of paying her energy bill.  Patient also reports having racing thoughts and being easily distracted. She continues to report grief and loss issues related to her family members especially her aunt.  She reports a pattern of trying not to think about it as well as suppressing her feelings.  She eventually recognizes having a crying spell this morning as a song reminded her of her aunt and she realized today is her aunt's birthday.    Suicidal/Homicidal: Nowithout intent/plan  Therapist Response: Reviewed symptoms, administered GAD-7, discussed results, praised and reinforced patient's efforts to practice deep breathing, discussed effects, developed plan with patient to continue practicing deep breathing gathered more information from patient regarding her symptoms, encouraged patient to follow through with appointment with psychiatrist Dr. Adrian Blackwater and express her concerns, provided psychoeducation on the anxiety and stress response, began to provide psychoeducation on what keeps anxiety going, discussed rationale for and developed plan with patient to keep anxiety log in preparation for next session medication,   Plan: Return again in 2 weeks.  Diagnosis: Moderate episode of recurrent major depressive disorder (HCC)  Collaboration of Care: Psychiatrist AEB patient sees psychiatrist Dr. Adrian Blackwater in this practice  Patient/Guardian was advised Release of Information must be obtained prior to any record release in order to collaborate their care with an outside provider. Patient/Guardian was advised if they have not already done so to contact the registration department to sign all necessary forms in order for [place] to release information regarding  their care.   Consent: Patient/Guardian gives verbal consent for treatment and assignment of benefits for services provided  during this visit. Patient/Guardian expressed understanding and agreed to proceed.   Adah Salvage, LCSW 11/21/2022

## 2022-11-21 NOTE — Progress Notes (Deleted)
   [address] [place] [address] Dept: [phone]  FOLLOW UP NOTE  Patient ID: [name], female    DOB: [birth year]  Age: 47 y.o. MRN: [id number] Date of Office Visit: 11/22/2022  Assessment  Chief Complaint: No chief complaint on file.  HPI NALEYAH OHLINGER is a 47 year old female who presents to the clinic for follow-up visit.  She was last seen in this clinic on 09/04/2022 by Dr. 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  Her last environmental allergy testing was on 09/04/2022 and was positive to grass pollen.  She did not get the labs that were ordered at her last visit   Drug Allergies:  Allergies  Allergen Reactions  . Diphenhydramine Hcl Anaphylaxis    (Benadryl)  . Orange Fruit Hives, Shortness Of Breath, Itching and Other (See Comments)    MAKES IT DIFFICULT TO BREATH  . Latex Rash    Physical Exam: There were no vitals taken for this visit.   Physical Exam  Diagnostics:    Assessment and Plan: No diagnosis found.  No orders of the defined types were placed in this encounter.   There are no Patient Instructions on file for this visit.  No follow-ups on file.    Thank you for the opportunity to care for this patient.  Please do not hesitate to contact me with questions.  Thermon Leyland, FNP [facility]

## 2022-11-22 ENCOUNTER — Other Ambulatory Visit: Payer: Self-pay

## 2022-11-22 ENCOUNTER — Encounter: Payer: Self-pay | Admitting: Family Medicine

## 2022-11-22 ENCOUNTER — Other Ambulatory Visit (HOSPITAL_COMMUNITY)
Admission: RE | Admit: 2022-11-22 | Discharge: 2022-11-22 | Disposition: A | Discharge status: Home / Self Care | Payer: BC Managed Care – PPO | Source: Ambulatory Visit | Attending: Family Medicine | Admitting: Family Medicine

## 2022-11-22 ENCOUNTER — Ambulatory Visit: Payer: BC Managed Care – PPO | Admitting: Family Medicine

## 2022-11-22 ENCOUNTER — Ambulatory Visit (INDEPENDENT_AMBULATORY_CARE_PROVIDER_SITE_OTHER): Payer: BC Managed Care – PPO | Admitting: Family Medicine

## 2022-11-22 VITALS — BP 125/87 | HR 79 | Ht 60.0 in | Wt 183.1 lb

## 2022-11-22 DIAGNOSIS — R21 Rash and other nonspecific skin eruption: Secondary | ICD-10-CM

## 2022-11-22 DIAGNOSIS — J4540 Moderate persistent asthma, uncomplicated: Secondary | ICD-10-CM | POA: Insufficient documentation

## 2022-11-22 DIAGNOSIS — N760 Acute vaginitis: Secondary | ICD-10-CM | POA: Insufficient documentation

## 2022-11-22 DIAGNOSIS — Z113 Encounter for screening for infections with a predominantly sexual mode of transmission: Secondary | ICD-10-CM | POA: Insufficient documentation

## 2022-11-22 DIAGNOSIS — I10 Essential (primary) hypertension: Secondary | ICD-10-CM | POA: Diagnosis not present

## 2022-11-22 DIAGNOSIS — B9689 Other specified bacterial agents as the cause of diseases classified elsewhere: Secondary | ICD-10-CM | POA: Insufficient documentation

## 2022-11-22 DIAGNOSIS — J309 Allergic rhinitis, unspecified: Secondary | ICD-10-CM

## 2022-11-22 DIAGNOSIS — F411 Generalized anxiety disorder: Secondary | ICD-10-CM

## 2022-11-22 DIAGNOSIS — G43909 Migraine, unspecified, not intractable, without status migrainosus: Secondary | ICD-10-CM | POA: Diagnosis not present

## 2022-11-22 MED ORDER — METRONIDAZOLE 0.75 % EX GEL: 1.0000 | Freq: Two times a day (BID) | CUTANEOUS | 5 refills | Status: DC

## 2022-11-22 NOTE — Assessment & Plan Note (Signed)
Not controlled based on recent GAD score, psych and therapist are treating

## 2022-11-22 NOTE — Assessment & Plan Note (Signed)
  Patient re-educated about  the importance of commitment to a  minimum of 150 minutes of exercise per week as able.  The importance of healthy food choices with portion control discussed, as well as eating regularly and within a 12 hour window most days. The need to choose "clean , green" food 50 to 75% of the time is discussed, as well as to make water the primary drink and set a goal of 64 ounces water daily.       11/22/2022   11:13 AM 11/09/2022    2:31 PM 10/19/2022   10:54 AM  Weight /BMI  Weight 183 lb 1.3 oz 183 lb 183 lb 9.6 oz  Height 5' (1.524 m) 5' (1.524 m) 5' (1.524 m)  BMI 35.76 kg/m2 35.74 kg/m2 35.86 kg/m2    Unchanged

## 2022-11-22 NOTE — Progress Notes (Signed)
Rebecca Boyd     MRN: [id number]      DOB: [birth year]  Chief Complaint  Patient presents with   Follow-up    Follow up, needs cream for skin unable to see dermatology until feb    HPI [name] is here for follow up and re-evaluation of chronic medical conditions, medication management and review of any available recent lab and radiology data.  Preventive health is updated, specifically  Cancer screening and Immunization.   Questions or concerns regarding consultations or procedures which the PT has had in the interim are  addressed. The PT denies any adverse reactions to current medications since the last visit. C/o rash on skin/ breaking out, appt with Derm is in 5 months  ROS Denies recent fever or chills. Denies sinus pressure, nasal congestion, ear pain or sore throat. Denies chest congestion, productive cough or wheezing. Denies chest pains, palpitations and leg swelling Denies abdominal pain, nausea, vomiting,diarrhea or constipation.   Denies dysuria, frequency, hesitancy or incontinence. C/o left leg and knee pain , currently being treated by Orthopedics. Denies headaches, seizures, numbness, or tingling. Denies depression, anxiety noted to be high yesterday C/o recurrent vaginal d/c , states get bV every month post cycle, wants to be tested today PE  BP 125/87 (BP Location: Right Arm, Patient Position: Sitting, Cuff Size: Large)   Pulse 79   Ht 5' (1.524 m)   Wt 183 lb 1.3 oz (83 kg)   SpO2 97%   BMI 35.76 kg/m   Patient alert and oriented and in no cardiopulmonary distress.  HEENT: No facial asymmetry, EOMI,     Neck supple .  Chest: Clear to auscultation bilaterally.  CVS: S1, S2 no murmurs, no S3.Regular rate.  ABD: Soft non tender.   Ext: No edema  MS: Adequate ROM spine, shoulders, hips and knees.  Skin: erythematous vesicles on upper extremities  Psych: Good eye contact, normal affect. Memory intact not anxious or depressed appearing.  CNS:  CN 2-12 intact, power,  normal throughout.no focal deficits noted.   Assessment & Plan  Rash and nonspecific skin eruption Betamethasone refilled , needs to collect  Morbid obesity due to excess calories [facility])  Patient re-educated about  the importance of commitment to a  minimum of 150 minutes of exercise per week as able.  The importance of healthy food choices with portion control discussed, as well as eating regularly and within a 12 hour window most days. The need to choose "clean , green" food 50 to 75% of the time is discussed, as well as to make water the primary drink and set a goal of 64 ounces water daily.       11/22/2022   11:13 AM 11/09/2022    2:31 PM 10/19/2022   10:54 AM  Weight /BMI  Weight 183 lb 1.3 oz 183 lb 183 lb 9.6 oz  Height 5' (1.524 m) 5' (1.524 m) 5' (1.524 m)  BMI 35.76 kg/m2 35.74 kg/m2 35.86 kg/m2    Unchanged  GAD (generalized anxiety disorder) Not controlled based on recent GAD score, psych and therapist are treating  Allergic rhinitis Undergoing testing by allergist to determine therapy  Bacterial vaginal infection Reports recurrent BV immediately following cycle, will rx metrogel , with refills , of note reports allergy to metronidazole tabs Self collected specimen sent for testing for BNv and STI  Migraine syndrome Managed by Neurology and controlled  Essential hypertension Controlled, no change in medication DASH diet and commitment to daily physical  activity for a minimum of 30 minutes discussed and encouraged, as a part of hypertension management. The importance of attaining a healthy weight is also discussed.     11/22/2022   11:13 AM 11/09/2022    2:31 PM 10/19/2022   10:54 AM 09/21/2022    8:19 AM 09/04/2022   10:17 AM 08/28/2022    3:21 PM 08/28/2022    2:51 PM  BP/Weight  Systolic BP 125 136 126 131 130 130 133  Diastolic BP 87 88 82 94 84 90 91  Wt. (Lbs) 183.08 183 183.6 187.4 184.8  184  BMI 35.76 kg/m2 35.74 kg/m2 35.86  kg/m2 36.6 kg/m2 35.35 kg/m2  35.94 kg/m2       Moderate persistent asthma without complication Managed by Pulmonary , improved control

## 2022-11-22 NOTE — Patient Instructions (Addendum)
Annual exam in September, call if you need me sooner  Specimens to be submitted for testing fior inflection  Fasting chem 7 and EGFr, TSH and vit D 5 to 7 days before visit  Di NOT take hydrocodone , you already have similar pai medication  BP is good  Cream for skin has been sent in  Metrogel once monthly post cycle will be prescribed  Please schedule mammogram at checkout  Thanks for choosing [facility], we consider it a privelige to serve you.

## 2022-11-22 NOTE — Assessment & Plan Note (Signed)
Managed by Pulmonary , improved control

## 2022-11-22 NOTE — Assessment & Plan Note (Signed)
Undergoing testing by allergist to determine therapy

## 2022-11-22 NOTE — Assessment & Plan Note (Signed)
Reports recurrent BV immediately following cycle, will rx metrogel , with refills , of note reports allergy to metronidazole tabs Self collected specimen sent for testing for BNv and STI

## 2022-11-22 NOTE — Assessment & Plan Note (Signed)
Managed by Neurology and controlled  

## 2022-11-22 NOTE — Assessment & Plan Note (Signed)
Controlled, no change in medication DASH diet and commitment to daily physical activity for a minimum of 30 minutes discussed and encouraged, as a part of hypertension management. The importance of attaining a healthy weight is also discussed.     11/22/2022   11:13 AM 11/09/2022    2:31 PM 10/19/2022   10:54 AM 09/21/2022    8:19 AM 09/04/2022   10:17 AM 08/28/2022    3:21 PM 08/28/2022    2:51 PM  BP/Weight  Systolic BP 125 136 126 131 130 130 133  Diastolic BP 87 88 82 94 84 90 91  Wt. (Lbs) 183.08 183 183.6 187.4 184.8  184  BMI 35.76 kg/m2 35.74 kg/m2 35.86 kg/m2 36.6 kg/m2 35.35 kg/m2  35.94 kg/m2

## 2022-11-22 NOTE — Assessment & Plan Note (Signed)
Betamethasone refilled , needs to collect

## 2022-11-23 DIAGNOSIS — M533 Sacrococcygeal disorders, not elsewhere classified: Secondary | ICD-10-CM | POA: Diagnosis not present

## 2022-11-23 DIAGNOSIS — M5416 Radiculopathy, lumbar region: Secondary | ICD-10-CM | POA: Diagnosis not present

## 2022-11-23 LAB — CERVICOVAGINAL ANCILLARY ONLY
Chlamydia: NEGATIVE
Comment: NEGATIVE
Comment: NEGATIVE
Comment: NEGATIVE
Comment: NEGATIVE
Comment: NEGATIVE
Comment: NORMAL
Neisseria Gonorrhea: NEGATIVE
Trichomonas: NEGATIVE

## 2022-11-27 DIAGNOSIS — M533 Sacrococcygeal disorders, not elsewhere classified: Secondary | ICD-10-CM | POA: Diagnosis not present

## 2022-11-29 ENCOUNTER — Encounter: Payer: Self-pay | Admitting: Family Medicine

## 2022-12-01 ENCOUNTER — Ambulatory Visit (HOSPITAL_COMMUNITY): Payer: BC Managed Care – PPO | Admitting: Occupational Therapy

## 2022-12-04 DIAGNOSIS — M545 Low back pain, unspecified: Secondary | ICD-10-CM | POA: Diagnosis not present

## 2022-12-04 DIAGNOSIS — G8929 Other chronic pain: Secondary | ICD-10-CM | POA: Diagnosis not present

## 2022-12-04 DIAGNOSIS — R030 Elevated blood-pressure reading, without diagnosis of hypertension: Secondary | ICD-10-CM | POA: Diagnosis not present

## 2022-12-04 DIAGNOSIS — M1990 Unspecified osteoarthritis, unspecified site: Secondary | ICD-10-CM | POA: Diagnosis not present

## 2022-12-04 DIAGNOSIS — G894 Chronic pain syndrome: Secondary | ICD-10-CM | POA: Diagnosis not present

## 2022-12-04 DIAGNOSIS — E559 Vitamin D deficiency, unspecified: Secondary | ICD-10-CM | POA: Diagnosis not present

## 2022-12-04 DIAGNOSIS — Z6835 Body mass index (BMI) 35.0-35.9, adult: Secondary | ICD-10-CM | POA: Diagnosis not present

## 2022-12-05 ENCOUNTER — Other Ambulatory Visit: Payer: Self-pay

## 2022-12-05 MED ORDER — FLUCONAZOLE 150 MG PO TABS: 150.0000 mg | ORAL_TABLET | Freq: Once | ORAL | 0 refills | Status: DC

## 2022-12-05 NOTE — Telephone Encounter (Signed)
Refill sent.

## 2022-12-12 ENCOUNTER — Telehealth (HOSPITAL_COMMUNITY): Payer: BC Managed Care – PPO | Admitting: Psychiatry

## 2022-12-12 ENCOUNTER — Encounter (HOSPITAL_COMMUNITY): Payer: Self-pay

## 2022-12-15 ENCOUNTER — Encounter (HOSPITAL_COMMUNITY): Payer: Self-pay | Admitting: Occupational Therapy

## 2022-12-15 ENCOUNTER — Ambulatory Visit (HOSPITAL_COMMUNITY): Payer: BC Managed Care – PPO | Attending: Orthopedic Surgery | Admitting: Occupational Therapy

## 2022-12-15 DIAGNOSIS — R29898 Other symptoms and signs involving the musculoskeletal system: Secondary | ICD-10-CM | POA: Insufficient documentation

## 2022-12-15 DIAGNOSIS — G8929 Other chronic pain: Secondary | ICD-10-CM | POA: Diagnosis not present

## 2022-12-15 DIAGNOSIS — M25511 Pain in right shoulder: Secondary | ICD-10-CM | POA: Insufficient documentation

## 2022-12-15 DIAGNOSIS — M25611 Stiffness of right shoulder, not elsewhere classified: Secondary | ICD-10-CM | POA: Insufficient documentation

## 2022-12-15 NOTE — Therapy (Unsigned)
OUTPATIENT OCCUPATIONAL THERAPY ORTHO EVALUATION  Patient Name: Rebecca Boyd MRN: [id number] DOB:1975-08-16, 47 y.o., female Today's Date: 12/17/2022   END OF SESSION:  OT End of Session - 12/17/22 2248     Visit Number 1    Number of Visits 9    Date for OT Re-Evaluation 01/19/23    Authorization Type BCBS    OT Start Time 1605    OT Stop Time 1631    OT Time Calculation (min) 26 min    Activity Tolerance Patient tolerated treatment well    Behavior During Therapy [facility] for tasks assessed/performed             Past Medical History:  Diagnosis Date   Anemia    Anxiety    Asthma    Bacterial vaginosis 07/27/2014   Cervical neck pain with evidence of disc disease 10/02/2013   Depressive disorder, not elsewhere classified    Epistaxis 07/09/2019   Family history of breast cancer    Family history of breast cancer in sister 09/25/2018   Family history of colon cancer    Family history of lung cancer    FH: breast cancer in relative when <39 years old 06/10/2018   Dx at age 8   Headache 03/16/2016   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   Initial dx is 05/2013    Hypertension    Migraine    Migraines    Miscarriage 2009   Neck pain    Nondisplaced fracture of fifth metatarsal bone, left foot, subsequent encounter for fracture with routine healing 08/15/18 09/25/2018   Pruritus 09/29/2018   Pruritus ani 09/25/2018   Rash and nonspecific skin eruption 05/14/2017   Vaginitis    cyctitis   Past Surgical History:  Procedure Laterality Date   BIOPSY  07/07/2022   Procedure: BIOPSY;  Surgeon: Lanelle Bal, DO;  [facility];  Service: Endoscopy;;   cervical cryotherapy N/A 1999   COLONOSCOPY WITH PROPOFOL N/A 05/10/2021   Procedure: COLONOSCOPY WITH PROPOFOL;  Surgeon: Lanelle Bal, DO;  [facility];  Service: Endoscopy;  Laterality: N/A;  11:30am   DILITATION & CURRETTAGE/HYSTROSCOPY WITH THERMACHOICE ABLATION  08/26/2013   Procedure:  DILATATION & CURETTAGE/HYSTEROSCOPY WITH THERMACHOICE ENDOMETRIAL ABLATION Procedure #2 Total Therapy Time=min       sec;  Surgeon: Tilda Burrow, MD;  Location: [facility];  Service: Gynecology;;   ESOPHAGOGASTRODUODENOSCOPY (EGD) WITH PROPOFOL N/A 07/07/2022   Procedure: ESOPHAGOGASTRODUODENOSCOPY (EGD) WITH PROPOFOL;  Surgeon: Lanelle Bal, DO;  [facility];  Service: Endoscopy;  Laterality: N/A;  245pm, asa 2   LAPAROSCOPIC BILATERAL SALPINGECTOMY Bilateral 08/26/2013   Procedure: LAPAROSCOPIC BILATERAL SALPINGECTOMY AND REMOVAL OF LEFT PERITUBAL CYST Procedure #1;  Surgeon: Tilda Burrow, MD;  Location: [facility];  Service: Gynecology;  Laterality: Bilateral;   LAPAROSCOPIC LYSIS OF ADHESIONS  08/26/2013   Procedure: LAPAROSCOPIC LYSIS OF ADHESIONS Procedure #1;  Surgeon: Tilda Burrow, MD;  [facility];  Service: Gynecology;;   POLYPECTOMY  05/10/2021   Procedure: POLYPECTOMY;  Surgeon: Lanelle Bal, DO;  [facility];  Service: Endoscopy;;   POLYPECTOMY  07/07/2022   Procedure: POLYPECTOMY;  Surgeon: Lanelle Bal, DO;  [facility];  Service: Endoscopy;;   VULVAR LESION REMOVAL  08/26/2013   Procedure: REMOVAL OF VULVAR SEBACEOUS CYST Procedure #3;  Surgeon: Tilda Burrow, MD;  [facility]: [facility];  Service: Gynecology;;   Patient Active Problem List   Diagnosis Date Noted  Bacterial vaginal infection 11/22/2022   Moderate persistent asthma without complication 11/22/2022   Shift work sleep disorder 10/04/2022   Caffeine overuse 10/04/2022   Caffeine-induced insomnia (HCC) 10/04/2022   OSA (obstructive sleep apnea) not on CPAP 10/04/2022   Hyperpigmentation 08/28/2022   Nodule of buttock 06/19/2022   Abnormal CXR 05/07/2022   Asthma exacerbation 04/27/2022   Syncope 04/27/2022   Moderate episode of recurrent major depressive disorder (HCC) 12/29/2021   GAD (generalized anxiety disorder) 12/29/2021   Left hip pain 10/02/2021    Dyslipidemia 08/25/2021   Prediabetes 08/25/2021   Low vitamin D level 07/07/2021   Elevated lipase 03/31/2021   FH: colon cancer 02/18/2021   Numbness and tingling of left hand 06/08/2020   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   Asthma 10/29/2019   Hypokalemia 07/11/2019   Family history of lung cancer    Rash and nonspecific skin eruption 05/14/2017   Urinary frequency 02/19/2017   Migraine syndrome 12/06/2016   Morbid obesity due to excess calories (HCC) 03/17/2015   Essential hypertension 01/07/2014   Flank pain 08/31/2013   Pain in thoracic spine 08/07/2013   Low back pain with radiation 07/30/2013   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   Vitamin D deficiency 10/27/2010   Allergic rhinitis 12/01/2008   Amenorrhea 02/12/2008   Neck pain 07/04/2007    PCP: Kerri Perches, MD REFERRING PROVIDER: Ty Hilts, MD  ONSET DATE: ~4 months  REFERRING DIAG: Chronic R shoulder Pain  THERAPY DIAG:  Chronic right shoulder pain  Shoulder stiffness, right  Other symptoms and signs involving the musculoskeletal system  Rationale for Evaluation and Treatment: Rehabilitation  SUBJECTIVE:   SUBJECTIVE STATEMENT: "Some days it's good and others no so much" Pt accompanied by: self  PERTINENT HISTORY: Pt has been having severe pain for 4 months with no clear start to her pain. MRI indicates quadrilateral space syndrome, along with non operative bicep tendinosis and iterstitial tears of the rotator cuff.   PRECAUTIONS: Shoulder  WEIGHT BEARING RESTRICTIONS: Yes 5lbs  PAIN:  Are you having pain? No  FALLS: Has patient fallen in last 6 months? No  PLOF: Independent  PATIENT GOALS: I want to try strengthening and eliminating as much pain as possible.  NEXT MD VISIT: 12/25/22  OBJECTIVE:   HAND DOMINANCE: Right  ADLs: Overall ADLs: Pt reports difficulty with lifting her arm up for dressing and bathing, unable to do her hair. Additionally, on  her bad days she is unable to cook or clean due to pain and weakness and requires help even on her good days.   FUNCTIONAL OUTCOME MEASURES: FOTO: 45.58  UPPER EXTREMITY ROM:       Assessed in seated, er/IR adducted  Active ROM Right eval  Shoulder flexion 144  Shoulder abduction 165  Shoulder internal rotation 90  Shoulder external rotation -4  (Blank rows = not tested)    UPPER EXTREMITY MMT:     Assessed in seated, er/IR adducted  MMT Right eval  Shoulder flexion 4/5  Shoulder abduction 4/5  Shoulder internal rotation 4+/5  Shoulder external rotation 4/5  (Blank rows = not tested)  SENSATION: WFL  EDEMA: No swelling noted  OBSERVATIONS: Moderate fascial restrictions along anterior shoulder girdle, biceps, deltoid, scapular region, and axillary region.    TODAY'S TREATMENT:  DATE: 12/15/22: Evaluation only    PATIENT EDUCATION: Education details: A/ROM Person educated: Patient Education method: Programmer, multimedia, Facilities manager, and Handouts Education comprehension: verbalized understanding and returned demonstration  HOME EXERCISE PROGRAM: 8/16: A/ROM  GOALS: Goals reviewed with patient? Yes   SHORT TERM GOALS: Target date: 01/19/23  Pt will be provided with and educated on HEP to improve mobility in RUE required for use during ADL completion.   Goal status: INITIAL  Pt will decrease pain in RUE to 3/10 or less to improve ability to sleep for 2+ consecutive hours without waking due to pain.   Goal status: INITIAL  2.  Pt will decrease RUE fascial restrictions to min amounts or less to improve mobility required for functional reaching tasks.   Goal status: INITIAL  3.  Pt will increase RUE A/ROM by 10 degrees to improve ability to use RUE when reaching overhead or behind back during dressing and bathing tasks.   Goal status:  INITIAL  4.  Pt will increase RUE strength to 5/5 or greater to improve ability to use RUE when lifting or carrying items during meal preparation/housework/yardwork tasks.   Goal status: INITIAL  5.  Pt will return to highest level of function using RUE as dominant during functional task completion.   Goal status: INITIAL   ASSESSMENT:  CLINICAL IMPRESSION: Patient is a 47 y.o. female who was seen today for occupational therapy evaluation for chronic R shoulder pain. Pt presents with increased pain and fascial restrictions, decreased ROM, strength, and functional use of the RUE.   PERFORMANCE DEFICITS: in functional skills including in functional skills including ADLs, IADLs, coordination, tone, ROM, strength, pain, fascial restrictions, muscle spasms, and UE functional use.  IMPAIRMENTS: are limiting patient from ADLs, IADLs, rest and sleep, work, leisure, and social participation.   COMORBIDITIES: has no other co-morbidities that affects occupational performance. Patient will benefit from skilled OT to address above impairments and improve overall function.  MODIFICATION OR ASSISTANCE TO COMPLETE EVALUATION: No modification of tasks or assist necessary to complete an evaluation.  OT OCCUPATIONAL PROFILE AND HISTORY: Problem focused assessment: Including review of records relating to presenting problem.  CLINICAL DECISION MAKING: LOW - limited treatment options, no task modification necessary  REHAB POTENTIAL: Good  EVALUATION COMPLEXITY: Low      PLAN:  OT FREQUENCY: 2x/week  OT DURATION: 4 weeks  PLANNED INTERVENTIONS: self care/ADL training, therapeutic exercise, therapeutic activity, neuromuscular re-education, manual therapy, passive range of motion, splinting, electrical stimulation, ultrasound, moist heat, cryotherapy, patient/family education, and DME and/or AE instructions  RECOMMENDED OTHER SERVICES: N/A  CONSULTED AND AGREED WITH PLAN OF CARE: Patient  PLAN  FOR NEXT SESSION: Manual Therapy, A/ROM, Scapular strengthening, Isometrics, Bicep loading   Trish Mage, OTR/L [facility] [phone] Lani Mendiola Rosemarie Beath, OT 12/17/2022, 10:50 PM

## 2022-12-15 NOTE — Patient Instructions (Signed)

## 2022-12-20 ENCOUNTER — Ambulatory Visit (HOSPITAL_COMMUNITY): Payer: BC Managed Care – PPO | Admitting: Psychiatry

## 2022-12-22 ENCOUNTER — Encounter (HOSPITAL_COMMUNITY): Payer: BC Managed Care – PPO | Admitting: Occupational Therapy

## 2022-12-26 DIAGNOSIS — M546 Pain in thoracic spine: Secondary | ICD-10-CM | POA: Diagnosis not present

## 2022-12-26 DIAGNOSIS — Z6835 Body mass index (BMI) 35.0-35.9, adult: Secondary | ICD-10-CM | POA: Diagnosis not present

## 2022-12-26 DIAGNOSIS — M5416 Radiculopathy, lumbar region: Secondary | ICD-10-CM | POA: Diagnosis not present

## 2022-12-26 DIAGNOSIS — G8929 Other chronic pain: Secondary | ICD-10-CM | POA: Diagnosis not present

## 2022-12-29 ENCOUNTER — Ambulatory Visit: Payer: BC Managed Care – PPO | Admitting: Family Medicine

## 2022-12-29 DIAGNOSIS — J309 Allergic rhinitis, unspecified: Secondary | ICD-10-CM

## 2022-12-29 NOTE — Progress Notes (Deleted)
[address] [place] [address] Dept: [phone]  FOLLOW UP NOTE  Patient ID: Rebecca Boyd, female    DOB: 11/05/1975  Age: 47 y.o. MRN: [id number] Date of Office Visit: 12/29/2022  Assessment  Chief Complaint: No chief complaint on file.  HPI Rebecca Boyd is a 47 year old female who presents to the clinic for follow-up visit.  She was last seen in this clinic on 09/04/2022 by Dr. Dellis Anes as a new patient for evaluation of asthma, allergic rhinitis, urticaria, stinging insect allergy, food allergy, and latex allergy.  She did not get the lab work that was recommended at her last visit to this clinic.   Drug Allergies:  Allergies  Allergen Reactions   Diphenhydramine Hcl Anaphylaxis    (Benadryl)   Orange Fruit Hives, Shortness Of Breath, Itching and Other (See Comments)    MAKES IT DIFFICULT TO BREATH   Latex Rash    Physical Exam: There were no vitals taken for this visit.   Physical Exam  Diagnostics:    Assessment and Plan: No diagnosis found.  No orders of the defined types were placed in this encounter.   There are no Patient Instructions on file for this visit.  No follow-ups on file.    Thank you for the opportunity to care for this patient.  Please do not hesitate to contact me with questions.  Thermon Leyland, FNP [facility]

## 2023-01-02 ENCOUNTER — Telehealth (INDEPENDENT_AMBULATORY_CARE_PROVIDER_SITE_OTHER): Payer: BC Managed Care – PPO | Admitting: Psychiatry

## 2023-01-02 ENCOUNTER — Encounter (HOSPITAL_COMMUNITY): Payer: Self-pay | Admitting: Occupational Therapy

## 2023-01-02 ENCOUNTER — Encounter (HOSPITAL_COMMUNITY): Payer: Self-pay | Admitting: Psychiatry

## 2023-01-02 ENCOUNTER — Ambulatory Visit (HOSPITAL_COMMUNITY): Payer: BC Managed Care – PPO | Attending: Orthopedic Surgery | Admitting: Occupational Therapy

## 2023-01-02 DIAGNOSIS — M25511 Pain in right shoulder: Secondary | ICD-10-CM | POA: Insufficient documentation

## 2023-01-02 DIAGNOSIS — M545 Low back pain, unspecified: Secondary | ICD-10-CM

## 2023-01-02 DIAGNOSIS — G4726 Circadian rhythm sleep disorder, shift work type: Secondary | ICD-10-CM | POA: Diagnosis not present

## 2023-01-02 DIAGNOSIS — F411 Generalized anxiety disorder: Secondary | ICD-10-CM | POA: Diagnosis not present

## 2023-01-02 DIAGNOSIS — G43909 Migraine, unspecified, not intractable, without status migrainosus: Secondary | ICD-10-CM | POA: Diagnosis not present

## 2023-01-02 DIAGNOSIS — R29898 Other symptoms and signs involving the musculoskeletal system: Secondary | ICD-10-CM | POA: Insufficient documentation

## 2023-01-02 DIAGNOSIS — F331 Major depressive disorder, recurrent, moderate: Secondary | ICD-10-CM | POA: Diagnosis not present

## 2023-01-02 DIAGNOSIS — M25611 Stiffness of right shoulder, not elsewhere classified: Secondary | ICD-10-CM | POA: Insufficient documentation

## 2023-01-02 DIAGNOSIS — G8929 Other chronic pain: Secondary | ICD-10-CM | POA: Diagnosis not present

## 2023-01-02 DIAGNOSIS — E559 Vitamin D deficiency, unspecified: Secondary | ICD-10-CM

## 2023-01-02 MED ORDER — VORTIOXETINE HBR 5 MG PO TABS: 5.0000 mg | ORAL_TABLET | Freq: Every day | ORAL | 1 refills | Status: DC

## 2023-01-02 NOTE — Patient Instructions (Signed)

## 2023-01-02 NOTE — Patient Instructions (Addendum)
We started Trintellix (vortioxetine) 5 mg once daily today.  Be sure your pharmacist helps enroll you in the drug company coupon program to make this very affordable.  I will coordinate with your PCP to get a sleep study referral and an iron panel, vitamin D, B12, folate level.

## 2023-01-02 NOTE — Therapy (Signed)
OUTPATIENT OCCUPATIONAL THERAPY ORTHO TREATMENT NOTE  Patient Name: Rebecca Boyd MRN: [id number] DOB:05/29/1975, 47 y.o., female Today's Date: 01/03/2023   END OF SESSION:  OT End of Session - 01/02/23 1730     Visit Number 2    Number of Visits 9    Date for OT Re-Evaluation 01/19/23    Authorization Type BCBS    OT Start Time 1647    OT Stop Time 1729    OT Time Calculation (min) 42 min    Activity Tolerance Patient tolerated treatment well    Behavior During Therapy [facility] for tasks assessed/performed             Past Medical History:  Diagnosis Date   Anemia    Anxiety    Asthma    Bacterial vaginosis 07/27/2014   Caffeine overuse 10/04/2022   Caffeine-induced insomnia (HCC) 10/04/2022   Cervical neck pain with evidence of disc disease 10/02/2013   Depressive disorder, not elsewhere classified    Epistaxis 07/09/2019   Family history of breast cancer    Family history of breast cancer in sister 09/25/2018   Family history of colon cancer    Family history of lung cancer    FH: breast cancer in relative when <75 years old 06/10/2018   Dx at age 38   Headache 03/16/2016   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   Initial dx is 05/2013    Hypertension    Migraine    Migraines    Miscarriage 2009   Neck pain    Nondisplaced fracture of fifth metatarsal bone, left foot, subsequent encounter for fracture with routine healing 08/15/18 09/25/2018   Pruritus 09/29/2018   Pruritus ani 09/25/2018   Rash and nonspecific skin eruption 05/14/2017   Vaginitis    cyctitis   Past Surgical History:  Procedure Laterality Date   BIOPSY  07/07/2022   Procedure: BIOPSY;  Surgeon: Lanelle Bal, DO;  [facility];  Service: Endoscopy;;   cervical cryotherapy N/A 1999   COLONOSCOPY WITH PROPOFOL N/A 05/10/2021   Procedure: COLONOSCOPY WITH PROPOFOL;  Surgeon: Lanelle Bal, DO;  [facility];  Service: Endoscopy;  Laterality: N/A;  11:30am   DILITATION &  CURRETTAGE/HYSTROSCOPY WITH THERMACHOICE ABLATION  08/26/2013   Procedure: DILATATION & CURETTAGE/HYSTEROSCOPY WITH THERMACHOICE ENDOMETRIAL ABLATION Procedure #2 Total Therapy Time=min       sec;  Surgeon: Tilda Burrow, MD;  [facility]: [facility];  Service: Gynecology;;   ESOPHAGOGASTRODUODENOSCOPY (EGD) WITH PROPOFOL N/A 07/07/2022   Procedure: ESOPHAGOGASTRODUODENOSCOPY (EGD) WITH PROPOFOL;  Surgeon: Lanelle Bal, DO;  [facility];  Service: Endoscopy;  Laterality: N/A;  245pm, asa 2   LAPAROSCOPIC BILATERAL SALPINGECTOMY Bilateral 08/26/2013   Procedure: LAPAROSCOPIC BILATERAL SALPINGECTOMY AND REMOVAL OF LEFT PERITUBAL CYST Procedure #1;  Surgeon: Tilda Burrow, MD;  [facility];  Service: Gynecology;  Laterality: Bilateral;   LAPAROSCOPIC LYSIS OF ADHESIONS  08/26/2013   Procedure: LAPAROSCOPIC LYSIS OF ADHESIONS Procedure #1;  Surgeon: Tilda Burrow, MD;  [facility];  Service: Gynecology;;   POLYPECTOMY  05/10/2021   Procedure: POLYPECTOMY;  Surgeon: Lanelle Bal, DO;  [facility];  Service: Endoscopy;;   POLYPECTOMY  07/07/2022   Procedure: POLYPECTOMY;  Surgeon: Lanelle Bal, DO;  [facility];  Service: Endoscopy;;   VULVAR LESION REMOVAL  08/26/2013   Procedure: REMOVAL OF VULVAR SEBACEOUS CYST Procedure #3;  Surgeon: Tilda Burrow, MD;  [facility]: [facility];  Service: Gynecology;;  Patient Active Problem List   Diagnosis Date Noted   Bacterial vaginal infection 11/22/2022   Moderate persistent asthma without complication 11/22/2022   Shift work sleep disorder 10/04/2022   OSA (obstructive sleep apnea) not on CPAP 10/04/2022   Hyperpigmentation 08/28/2022   Nodule of buttock 06/19/2022   Abnormal CXR 05/07/2022   Asthma exacerbation 04/27/2022   Syncope 04/27/2022   Moderate episode of recurrent major depressive disorder (HCC) 12/29/2021   GAD (generalized anxiety disorder) 12/29/2021   Left hip pain 10/02/2021    Dyslipidemia 08/25/2021   Prediabetes 08/25/2021   Low vitamin D level 07/07/2021   Elevated lipase 03/31/2021   FH: colon cancer 02/18/2021   Numbness and tingling of left hand 06/08/2020   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   Asthma 10/29/2019   Hypokalemia 07/11/2019   Family history of lung cancer    Rash and nonspecific skin eruption 05/14/2017   Urinary frequency 02/19/2017   Migraine syndrome 12/06/2016   Morbid obesity due to excess calories (HCC) 03/17/2015   Essential hypertension 01/07/2014   Flank pain 08/31/2013   Pain in thoracic spine 08/07/2013   Low back pain with radiation 07/30/2013   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   Vitamin D deficiency 10/27/2010   Allergic rhinitis 12/01/2008   Amenorrhea 02/12/2008   Neck pain 07/04/2007    PCP: Kerri Perches, MD REFERRING PROVIDER: Ty Hilts, MD  ONSET DATE: ~4 months  REFERRING DIAG: Chronic R shoulder Pain  THERAPY DIAG:  Chronic right shoulder pain  Shoulder stiffness, right  Other symptoms and signs involving the musculoskeletal system  Rationale for Evaluation and Treatment: Rehabilitation  SUBJECTIVE:   SUBJECTIVE STATEMENT: "Girl I am hurting bad today." Pt accompanied by: self  PERTINENT HISTORY: Pt has been having severe pain for 4 months with no clear start to her pain. MRI indicates quadrilateral space syndrome, along with non operative bicep tendinosis and iterstitial tears of the rotator cuff.   PRECAUTIONS: Shoulder  WEIGHT BEARING RESTRICTIONS: Yes 5lbs  PAIN:  Are you having pain? Yes: NPRS scale: 8/10 Pain location: anterior shoulder Pain description: aching Aggravating factors: movement Relieving factors: nothing  FALLS: Has patient fallen in last 6 months? No  PLOF: Independent  PATIENT GOALS: I want to try strengthening and eliminating as much pain as possible.  NEXT MD VISIT: 12/25/22  OBJECTIVE:   HAND DOMINANCE:  Right  ADLs: Overall ADLs: Pt reports difficulty with lifting her arm up for dressing and bathing, unable to do her hair. Additionally, on her bad days she is unable to cook or clean due to pain and weakness and requires help even on her good days.   FUNCTIONAL OUTCOME MEASURES: FOTO: 45.58  UPPER EXTREMITY ROM:       Assessed in seated, er/IR adducted  Active ROM Right eval  Shoulder flexion 144  Shoulder abduction 165  Shoulder internal rotation 90  Shoulder external rotation -4  (Blank rows = not tested)    UPPER EXTREMITY MMT:     Assessed in seated, er/IR adducted  MMT Right eval  Shoulder flexion 4/5  Shoulder abduction 4/5  Shoulder internal rotation 4+/5  Shoulder external rotation 4/5  (Blank rows = not tested)  SENSATION: WFL  EDEMA: No swelling noted  OBSERVATIONS: Moderate fascial restrictions along anterior shoulder girdle, biceps, deltoid, scapular region, and axillary region.    TODAY'S TREATMENT:  DATE:  01/02/23 -Manual Therapy: myofascial release and trigger point applied to the bicep, trapezius, scapular region, and pectoralis, in order to reduce pain, fascial restrictions, and improve ROM.  -A/ROM: supine, flexion, abduction, protraction, horizontal abduction, er/IR, x12 -Isometrics: Flexion, abduction, extension, ir, 4x15" -Scapular Strengthening: Green band, extension, retraction, rows, x12 -A/ROM: standing, flexion, abduction, protraction, horizontal abduction, er/IR, x10 -Ball Rolls: flexion, abduction, x10   PATIENT EDUCATION: Education details: Isometrics Person educated: Patient Education method: Programmer, multimedia, Facilities manager, and Handouts Education comprehension: verbalized understanding and returned demonstration  HOME EXERCISE PROGRAM: 8/16: A/ROM 9/3: Isometrics  GOALS: Goals reviewed with patient?  Yes   SHORT TERM GOALS: Target date: 01/19/23  Pt will be provided with and educated on HEP to improve mobility in RUE required for use during ADL completion.   Goal status: IN PROGRESS  Pt will decrease pain in RUE to 3/10 or less to improve ability to sleep for 2+ consecutive hours without waking due to pain.   Goal status: IN PROGRESS  2.  Pt will decrease RUE fascial restrictions to min amounts or less to improve mobility required for functional reaching tasks.   Goal status: IN PROGRESS  3.  Pt will increase RUE A/ROM by 10 degrees to improve ability to use RUE when reaching overhead or behind back during dressing and bathing tasks.   Goal status: IN PROGRESS  4.  Pt will increase RUE strength to 5/5 or greater to improve ability to use RUE when lifting or carrying items during meal preparation/housework/yardwork tasks.   Goal status: IN PROGRESS  5.  Pt will return to highest level of function using RUE as dominant during functional task completion.   Goal status: IN PROGRESS   ASSESSMENT:  CLINICAL IMPRESSION: This session, pt reporting increased pain with all mobility and pressure. With manual therapy and ROM pt was able to loosen up some and improve her overall movement and pain. OT had pt work on isometrics and scapular strengthening, which she reported increased pain with these activities, however she was able to complete them with increased time and rest breaks. Verbal and tactile cuing provided throughout session for positioning and technique.   PERFORMANCE DEFICITS: in functional skills including in functional skills including ADLs, IADLs, coordination, tone, ROM, strength, pain, fascial restrictions, muscle spasms, and UE functional use.    PLAN:  OT FREQUENCY: 2x/week  OT DURATION: 4 weeks  PLANNED INTERVENTIONS: self care/ADL training, therapeutic exercise, therapeutic activity, neuromuscular re-education, manual therapy, passive range of motion,  splinting, electrical stimulation, ultrasound, moist heat, cryotherapy, patient/family education, and DME and/or AE instructions  RECOMMENDED OTHER SERVICES: N/A  CONSULTED AND AGREED WITH PLAN OF CARE: Patient  PLAN FOR NEXT SESSION: Manual Therapy, A/ROM, Scapular strengthening, Isometrics, Bicep loading   Trish Mage, OTR/L [facility] [phone] [place] Rosemarie Beath, OT 01/03/2023, 3:16 PM
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
## 2023-01-05 ENCOUNTER — Ambulatory Visit (INDEPENDENT_AMBULATORY_CARE_PROVIDER_SITE_OTHER): Payer: BC Managed Care – PPO | Admitting: Family Medicine

## 2023-01-05 ENCOUNTER — Encounter: Payer: Self-pay | Admitting: Family Medicine

## 2023-01-05 ENCOUNTER — Encounter (HOSPITAL_COMMUNITY): Payer: BC Managed Care – PPO | Admitting: Occupational Therapy

## 2023-01-05 VITALS — BP 126/86 | HR 81 | Ht 60.0 in | Wt 181.1 lb

## 2023-01-05 DIAGNOSIS — Z1231 Encounter for screening mammogram for malignant neoplasm of breast: Secondary | ICD-10-CM

## 2023-01-05 DIAGNOSIS — Z0000 Encounter for general adult medical examination without abnormal findings: Secondary | ICD-10-CM

## 2023-01-05 DIAGNOSIS — Z23 Encounter for immunization: Secondary | ICD-10-CM | POA: Diagnosis not present

## 2023-01-05 DIAGNOSIS — E785 Hyperlipidemia, unspecified: Secondary | ICD-10-CM

## 2023-01-05 DIAGNOSIS — I10 Essential (primary) hypertension: Secondary | ICD-10-CM

## 2023-01-05 DIAGNOSIS — R7989 Other specified abnormal findings of blood chemistry: Secondary | ICD-10-CM

## 2023-01-05 NOTE — Patient Instructions (Addendum)
F/u in 4 months, call iof you needme sooner ' Flu vaccine today  Mammogram at breast center will be scheduled and appt info provided  CBC, lipid, cmp and EGFR, TSH and vit D today  Thanks for choosing [facility], we consider it a privelige to serve you.

## 2023-01-08 ENCOUNTER — Encounter: Payer: Self-pay | Admitting: Family Medicine

## 2023-01-08 NOTE — Progress Notes (Signed)
    Rebecca Boyd     MRN: [id number]      DOB: 12-04-1975  Chief Complaint  Patient presents with   Annual Exam    R-30 L-30 B- 25   Flu Vaccine    Vaccine given 01/05/23   Cyst    Cyst on left kidney causing pain   Headache    Lingering for the past 4 days. Otc and hydration not helping.    Vaginal Bleeding    X2 weeks w/ cramping. No cycle since January of 2024    HPI: Patient is in for annual physical exam. Additional Health concerns as expressed above Recent labs,  are reviewed. Immunization is reviewed , and  updated if needed.   PE: BP 126/86 (BP Location: Right Arm, Patient Position: Sitting, Cuff Size: Normal)   Pulse 81   Ht 5' (1.524 m)   Wt 181 lb 1.9 oz (82.2 kg)   SpO2 96%   BMI 35.37 kg/m  Pleasant  female, alert and oriented x 3, in no cardio-pulmonary distress. Afebrile. HEENT No facial trauma or asymetry. Sinuses non tender.  Extra occullar muscles intact.. External ears normal, . Neck: supple, no adenopathy,JVD or thyromegaly.No bruits.  Chest: Clear to ascultation bilaterally.No crackles or wheezes. Non tender to palpation  Breast: Not examined, no concerns and mammogram is uTD  Cardiovascular system; Heart sounds normal,  S1 and  S2 ,no S3.  No murmur, or thrill. Apical beat not displaced Peripheral pulses normal.  Abdomen: Soft, non tender, no organomegaly or masses. No bruits. Bowel sounds normal. No guarding, tenderness or rebound.   GU: Not examined  Musculoskeletal exam: Full ROM of spine, hips , shoulders and knees. No deformity ,swelling or crepitus noted. No muscle wasting or atrophy.   Neurologic: Cranial nerves 2 to 12 intact. Power, tone ,sensation and reflexes normal throughout. No disturbance in gait. No tremor.  Skin: Intact, no ulceration, erythema , scaling or rash noted. Pigmentation normal throughout  Psych; Normal mood and affect. Judgement and concentration normal   Assessment & Plan:   Annual physical exam Annual exam as documented. Immunization and cancer screening needs are specifically addressed at this visit.

## 2023-01-08 NOTE — Assessment & Plan Note (Signed)
Annual exam as documented. . Immunization and cancer screening needs are specifically addressed at this visit.  

## 2023-01-09 ENCOUNTER — Ambulatory Visit (HOSPITAL_COMMUNITY): Payer: BC Managed Care – PPO | Admitting: Occupational Therapy

## 2023-01-09 ENCOUNTER — Encounter (HOSPITAL_COMMUNITY): Payer: Self-pay | Admitting: Occupational Therapy

## 2023-01-09 DIAGNOSIS — G8929 Other chronic pain: Secondary | ICD-10-CM | POA: Diagnosis not present

## 2023-01-09 DIAGNOSIS — M545 Low back pain, unspecified: Secondary | ICD-10-CM | POA: Diagnosis not present

## 2023-01-09 DIAGNOSIS — Z6835 Body mass index (BMI) 35.0-35.9, adult: Secondary | ICD-10-CM | POA: Diagnosis not present

## 2023-01-09 DIAGNOSIS — M129 Arthropathy, unspecified: Secondary | ICD-10-CM | POA: Diagnosis not present

## 2023-01-09 DIAGNOSIS — R29898 Other symptoms and signs involving the musculoskeletal system: Secondary | ICD-10-CM

## 2023-01-09 DIAGNOSIS — Z79899 Other long term (current) drug therapy: Secondary | ICD-10-CM | POA: Diagnosis not present

## 2023-01-09 DIAGNOSIS — M25511 Pain in right shoulder: Secondary | ICD-10-CM | POA: Diagnosis not present

## 2023-01-09 DIAGNOSIS — M1990 Unspecified osteoarthritis, unspecified site: Secondary | ICD-10-CM | POA: Diagnosis not present

## 2023-01-09 DIAGNOSIS — M25611 Stiffness of right shoulder, not elsewhere classified: Secondary | ICD-10-CM | POA: Diagnosis not present

## 2023-01-09 DIAGNOSIS — E6609 Other obesity due to excess calories: Secondary | ICD-10-CM | POA: Diagnosis not present

## 2023-01-09 DIAGNOSIS — R5383 Other fatigue: Secondary | ICD-10-CM | POA: Diagnosis not present

## 2023-01-09 NOTE — Patient Instructions (Signed)
Therapy Ball Strengthening Exercises: Complete all exercises 10-15X each, 2x/day.   1) Overhead press: Hold a medicine ball or other ball at your chest. Next, extend your elbows and push the ball over your head as shown. Return to starting position and repeat.     2) Chest press: Hold a medicine ball or other ball at your chest. Next, extend your elbows and push the ball outward in front of your body as shown. Return to starting position and repeat.     3) Ball circles:  Hold a medicine ball or other ball at your chest. Next, extend your elbows and push the ball outward in front of your body as shown. Then, move the ball in a circular pattern for several revolutions and then reverse the direction.     4) Flexion: Start holding an exercise ball out in front of your body with elbows extended. Then, slowly lift the ball up over head. Return the ball to starting position and repeat.     5) Ball diagonals: Begin holding a ball with both hands and then move it through a diagonal pattern from your hip to over your opposite shoulder as shown.    

## 2023-01-09 NOTE — Therapy (Signed)
OUTPATIENT OCCUPATIONAL THERAPY ORTHO TREATMENT NOTE  Patient Name: Rebecca Boyd MRN: [id number] DOB:26-May-1975, 47 y.o., female Today's Date: 01/09/2023   END OF SESSION:  OT End of Session - 01/09/23 1602     Visit Number 3    Number of Visits 9    Date for OT Re-Evaluation 01/19/23    Authorization Type [employer]    OT Start Time 1516    OT Stop Time 1556    OT Time Calculation (min) 40 min    Activity Tolerance Patient tolerated treatment well    Behavior During Therapy [facility] for tasks assessed/performed              Past Medical History:  Diagnosis Date   Anemia    Anxiety    Asthma    Bacterial vaginosis 07/27/2014   Caffeine overuse 10/04/2022   Caffeine-induced insomnia (HCC) 10/04/2022   Cervical neck pain with evidence of disc disease 10/02/2013   Depressive disorder, not elsewhere classified    Epistaxis 07/09/2019   Family history of breast cancer    Family history of breast cancer in sister 09/25/2018   Family history of colon cancer    Family history of lung cancer    FH: breast cancer in relative when <57 years old 06/10/2018   Dx at age 38   Headache 03/16/2016   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   Initial dx is 05/2013    Hypertension    Migraine    Migraines    Miscarriage 2009   Neck pain    Nondisplaced fracture of fifth metatarsal bone, left foot, subsequent encounter for fracture with routine healing 08/15/18 09/25/2018   Pruritus 09/29/2018   Pruritus ani 09/25/2018   Rash and nonspecific skin eruption 05/14/2017   Vaginitis    cyctitis   Past Surgical History:  Procedure Laterality Date   BIOPSY  07/07/2022   Procedure: BIOPSY;  Surgeon: Lanelle Bal, DO;  [facility];  Service: Endoscopy;;   cervical cryotherapy N/A 1999   COLONOSCOPY WITH PROPOFOL N/A 05/10/2021   Procedure: COLONOSCOPY WITH PROPOFOL;  Surgeon: Lanelle Bal, DO;  [facility]: [facility];  Service: Endoscopy;  Laterality: N/A;  11:30am   DILITATION  & CURRETTAGE/HYSTROSCOPY WITH THERMACHOICE ABLATION  08/26/2013   Procedure: DILATATION & CURETTAGE/HYSTEROSCOPY WITH THERMACHOICE ENDOMETRIAL ABLATION Procedure #2 Total Therapy Time=min       sec;  Surgeon: Tilda Burrow, MD;  Location: [facility];  Service: Gynecology;;   ESOPHAGOGASTRODUODENOSCOPY (EGD) WITH PROPOFOL N/A 07/07/2022   Procedure: ESOPHAGOGASTRODUODENOSCOPY (EGD) WITH PROPOFOL;  Surgeon: Lanelle Bal, DO;  [facility];  Service: Endoscopy;  Laterality: N/A;  245pm, asa 2   LAPAROSCOPIC BILATERAL SALPINGECTOMY Bilateral 08/26/2013   Procedure: LAPAROSCOPIC BILATERAL SALPINGECTOMY AND REMOVAL OF LEFT PERITUBAL CYST Procedure #1;  Surgeon: Tilda Burrow, MD;  Location: [facility];  Service: Gynecology;  Laterality: Bilateral;   LAPAROSCOPIC LYSIS OF ADHESIONS  08/26/2013   Procedure: LAPAROSCOPIC LYSIS OF ADHESIONS Procedure #1;  Surgeon: Tilda Burrow, MD;  Location: [facility];  Service: Gynecology;;   POLYPECTOMY  05/10/2021   Procedure: POLYPECTOMY;  Surgeon: Lanelle Bal, DO;  [facility];  Service: Endoscopy;;   POLYPECTOMY  07/07/2022   Procedure: POLYPECTOMY;  Surgeon: Lanelle Bal, DO;  [facility];  Service: Endoscopy;;   VULVAR LESION REMOVAL  08/26/2013   Procedure: REMOVAL OF VULVAR SEBACEOUS CYST Procedure #3;  Surgeon: Tilda Burrow, MD;  Location: [facility];  Service:  Gynecology;;   Patient Active Problem List   Diagnosis Date Noted   Bacterial vaginal infection 11/22/2022   Moderate persistent asthma without complication 11/22/2022   Shift work sleep disorder 10/04/2022   OSA (obstructive sleep apnea) not on CPAP 10/04/2022   Hyperpigmentation 08/28/2022   Nodule of buttock 06/19/2022   Abnormal CXR 05/07/2022   Asthma exacerbation 04/27/2022   Syncope 04/27/2022   Moderate episode of recurrent major depressive disorder (HCC) 12/29/2021   GAD (generalized anxiety disorder) 12/29/2021   Left hip pain 10/02/2021    Dyslipidemia 08/25/2021   Prediabetes 08/25/2021   Low vitamin D level 07/07/2021   Elevated lipase 03/31/2021   FH: colon cancer 02/18/2021   Numbness and tingling of left hand 06/08/2020   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   Asthma 10/29/2019   Hypokalemia 07/11/2019   Family history of lung cancer    Rash and nonspecific skin eruption 05/14/2017   Urinary frequency 02/19/2017   Migraine syndrome 12/06/2016   Annual physical exam 11/11/2015   Morbid obesity due to excess calories (HCC) 03/17/2015   Essential hypertension 01/07/2014   Flank pain 08/31/2013   Pain in thoracic spine 08/07/2013   Low back pain with radiation 07/30/2013   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   Vitamin D deficiency 10/27/2010   Allergic rhinitis 12/01/2008   Amenorrhea 02/12/2008   Neck pain 07/04/2007    PCP: Kerri Perches, MD REFERRING PROVIDER: Ty Hilts, MD  ONSET DATE: ~4 months  REFERRING DIAG: Chronic R shoulder Pain  THERAPY DIAG:  Chronic right shoulder pain  Shoulder stiffness, right  Other symptoms and signs involving the musculoskeletal system  Rationale for Evaluation and Treatment: Rehabilitation  SUBJECTIVE:   SUBJECTIVE STATEMENT: "Girl I am hurting bad today." Pt accompanied by: self  PERTINENT HISTORY: Pt has been having severe pain for 4 months with no clear start to her pain. MRI indicates quadrilateral space syndrome, along with non operative bicep tendinosis and iterstitial tears of the rotator cuff.   PRECAUTIONS: Shoulder  WEIGHT BEARING RESTRICTIONS: Yes 5lbs  PAIN:  Are you having pain? Yes: NPRS scale: 8/10 Pain location: anterior shoulder Pain description: aching Aggravating factors: movement Relieving factors: nothing  FALLS: Has patient fallen in last 6 months? No  PLOF: Independent  PATIENT GOALS: I want to try strengthening and eliminating as much pain as possible.  NEXT MD VISIT: 12/25/22  OBJECTIVE:    HAND DOMINANCE: Right  ADLs: Overall ADLs: Pt reports difficulty with lifting her arm up for dressing and bathing, unable to do her hair. Additionally, on her bad days she is unable to cook or clean due to pain and weakness and requires help even on her good days.   FUNCTIONAL OUTCOME MEASURES: FOTO: 45.58  UPPER EXTREMITY ROM:       Assessed in seated, er/IR adducted  Active ROM Right eval  Shoulder flexion 144  Shoulder abduction 165  Shoulder internal rotation 90  Shoulder external rotation -4  (Blank rows = not tested)    UPPER EXTREMITY MMT:     Assessed in seated, er/IR adducted  MMT Right eval  Shoulder flexion 4/5  Shoulder abduction 4/5  Shoulder internal rotation 4+/5  Shoulder external rotation 4/5  (Blank rows = not tested)  SENSATION: WFL  EDEMA: No swelling noted  OBSERVATIONS: Moderate fascial restrictions along anterior shoulder girdle, biceps, deltoid, scapular region, and axillary region.    TODAY'S TREATMENT:  DATE:  01/09/23 -Manual Therapy: myofascial release and trigger point applied to the bicep, trapezius, scapular region, and pectoralis, in order to reduce pain, fascial restrictions, and improve ROM.  -A/ROM: seated, flexion, abduction, protraction, horizontal abduction, er/IR, x15 -ABC Ball On the wall -Overhead lacing -Therapy Ball Exercises: flexion, protraction, overhead press, V ups, Circles both directions, x10 -[facility]: flexion, abduction, x10  01/02/23 -Manual Therapy: myofascial release and trigger point applied to the bicep, trapezius, scapular region, and pectoralis, in order to reduce pain, fascial restrictions, and improve ROM.  -A/ROM: supine, flexion, abduction, protraction, horizontal abduction, er/IR, x12 -Isometrics: Flexion, abduction, extension, ir, 4x15" -Scapular Strengthening: Green  band, extension, retraction, rows, x12 -A/ROM: standing, flexion, abduction, protraction, horizontal abduction, er/IR, x10 -Ball Rolls: flexion, abduction, x10   PATIENT EDUCATION: Education details: Therapy Ball Exercises Person educated: Patient Education method: Explanation, Demonstration, and Handouts Education comprehension: verbalized understanding and returned demonstration  HOME EXERCISE PROGRAM: 8/16: A/ROM 9/3: Isometrics 9/10: Therapy Ball Exercises  GOALS: Goals reviewed with patient? Yes   SHORT TERM GOALS: Target date: 01/19/23  Pt will be provided with and educated on HEP to improve mobility in RUE required for use during ADL completion.   Goal status: IN PROGRESS  Pt will decrease pain in RUE to 3/10 or less to improve ability to sleep for 2+ consecutive hours without waking due to pain.   Goal status: IN PROGRESS  2.  Pt will decrease RUE fascial restrictions to min amounts or less to improve mobility required for functional reaching tasks.   Goal status: IN PROGRESS  3.  Pt will increase RUE A/ROM by 10 degrees to improve ability to use RUE when reaching overhead or behind back during dressing and bathing tasks.   Goal status: IN PROGRESS  4.  Pt will increase RUE strength to 5/5 or greater to improve ability to use RUE when lifting or carrying items during meal preparation/housework/yardwork tasks.   Goal status: IN PROGRESS  5.  Pt will return to highest level of function using RUE as dominant during functional task completion.   Goal status: IN PROGRESS   ASSESSMENT:  CLINICAL IMPRESSION: This session, pt demonstrating improvement with mobility and movement pattern. She continues to fatigue quickly with all exercises and requires multiple short rest breaks. OT adding endurance based activities and therapy ball exercises to improve strength and activity tolerance. Verbal and visual cuing for positioning and technique throughout session.    PERFORMANCE DEFICITS: in functional skills including in functional skills including ADLs, IADLs, coordination, tone, ROM, strength, pain, fascial restrictions, muscle spasms, and UE functional use.    PLAN:  OT FREQUENCY: 2x/week  OT DURATION: 4 weeks  PLANNED INTERVENTIONS: self care/ADL training, therapeutic exercise, therapeutic activity, neuromuscular re-education, manual therapy, passive range of motion, splinting, electrical stimulation, ultrasound, moist heat, cryotherapy, patient/family education, and DME and/or AE instructions  RECOMMENDED OTHER SERVICES: N/A  CONSULTED AND AGREED WITH PLAN OF CARE: Patient  PLAN FOR NEXT SESSION: Manual Therapy, A/ROM, Scapular strengthening, Isometrics, Bicep loading   Trish Mage, OTR/L [facility] [phone] Kennyth Arnold, OT 01/09/2023, 4:03 PM

## 2023-01-09 NOTE — Progress Notes (Deleted)
[address] [place] [address] Dept: [phone]  FOLLOW UP NOTE  Patient ID: Rebecca Boyd, female    DOB: [birth year]  Age: 47 y.o. MRN: [id number] Date of Office Visit: 01/10/2023  Assessment  Chief Complaint: No chief complaint on file.  HPI Rebecca Boyd is a 47 year old female who presents to the clinic for follow-up visit.  She was last seen in this clinic on 09/04/2022 by Dr. Dellis Anes as a new patient for evaluation of asthma, allergic rhinitis, urticaria, stinging insect allergy, food allergy, and latex allergy.  She did not get the lab work that was recommended at her last visit to this clinic.   Drug Allergies:  Allergies  Allergen Reactions  . Diphenhydramine Hcl Anaphylaxis    (Benadryl)  . Orange Fruit Hives, Shortness Of Breath, Itching and Other (See Comments)    MAKES IT DIFFICULT TO BREATH  . Latex Rash    Physical Exam: There were no vitals taken for this visit.   Physical Exam  Diagnostics:    Assessment and Plan: No diagnosis found.  No orders of the defined types were placed in this encounter.   There are no Patient Instructions on file for this visit.  No follow-ups on file.    Thank you for the opportunity to care for this patient.  Please do not hesitate to contact me with questions.  Thermon Leyland, FNP [facility]

## 2023-01-10 ENCOUNTER — Ambulatory Visit: Payer: BC Managed Care – PPO | Admitting: Family Medicine

## 2023-01-10 DIAGNOSIS — J309 Allergic rhinitis, unspecified: Secondary | ICD-10-CM

## 2023-01-12 ENCOUNTER — Encounter (HOSPITAL_COMMUNITY): Payer: BC Managed Care – PPO | Admitting: Occupational Therapy

## 2023-01-12 ENCOUNTER — Telehealth (HOSPITAL_COMMUNITY): Payer: Self-pay | Admitting: Occupational Therapy

## 2023-01-12 DIAGNOSIS — Z79899 Other long term (current) drug therapy: Secondary | ICD-10-CM | POA: Diagnosis not present

## 2023-01-12 NOTE — Telephone Encounter (Signed)
OT spoke with pt regarding her No Show on 01/12/23 at 2:30p. She reported that she forgot this appointment. OT informed pt her next appointment is 01/15/23 at 3:15p.   Trish Mage, OTR/L [facility] [phone]

## 2023-01-15 ENCOUNTER — Encounter: Payer: Self-pay | Admitting: Family Medicine

## 2023-01-15 ENCOUNTER — Other Ambulatory Visit: Payer: Self-pay

## 2023-01-15 ENCOUNTER — Ambulatory Visit (HOSPITAL_COMMUNITY): Payer: BC Managed Care – PPO | Admitting: Occupational Therapy

## 2023-01-15 ENCOUNTER — Encounter (HOSPITAL_COMMUNITY): Payer: Self-pay | Admitting: Occupational Therapy

## 2023-01-15 ENCOUNTER — Ambulatory Visit (INDEPENDENT_AMBULATORY_CARE_PROVIDER_SITE_OTHER): Payer: BC Managed Care – PPO | Admitting: Psychiatry

## 2023-01-15 DIAGNOSIS — R29898 Other symptoms and signs involving the musculoskeletal system: Secondary | ICD-10-CM

## 2023-01-15 DIAGNOSIS — G8929 Other chronic pain: Secondary | ICD-10-CM

## 2023-01-15 DIAGNOSIS — M25611 Stiffness of right shoulder, not elsewhere classified: Secondary | ICD-10-CM

## 2023-01-15 DIAGNOSIS — M25511 Pain in right shoulder: Secondary | ICD-10-CM | POA: Diagnosis not present

## 2023-01-15 DIAGNOSIS — F331 Major depressive disorder, recurrent, moderate: Secondary | ICD-10-CM | POA: Diagnosis not present

## 2023-01-15 MED ORDER — METRONIDAZOLE 0.75 % EX GEL: 1.0000 | Freq: Two times a day (BID) | CUTANEOUS | 5 refills | Status: DC

## 2023-01-15 MED ORDER — FLUCONAZOLE 150 MG PO TABS: 150.0000 mg | ORAL_TABLET | Freq: Once | ORAL | 0 refills | Status: AC

## 2023-01-15 NOTE — Patient Instructions (Signed)
1) (Home) Extension: Isometric / Bilateral Arm Retraction - Sitting ? ? ?Facing anchor, hold hands and elbow at shoulder height, with elbow bent.  Pull arms back to squeeze shoulder blades together. Repeat 10-15 times. 1-3 times/day.  ? ?2) (Clinic) Extension / Flexion (Assist) ? ? ?Face anchor, pull arms back, keeping elbow straight, and squeze shoulder blades together. ?Repeat 10-15 times. 1-3 times/day.  ? ?Copyright ? [employer]. All rights reserved.  ? ?3) (Home) Retraction: Row - Bilateral (Anchor) ? ? ?Facing anchor, arms reaching forward, pull hands toward stomach, keeping elbows bent and at your sides and pinching shoulder blades together. ?Repeat 10-15 times. 1-3 times/day.  ? ?Copyright ? [employer]. All rights reserved.  ? ? ? ? ?Theraband strengthening: Complete 10-15X, 1-2X/day ? ?1) Shoulder protraction ? ?Anchor band in doorway, stand with back to door. Push your hand forward as much as you can to bringing your shoulder blades forward on your rib cage. ? ? ? ? ? ?2) Shoulder horizontal abduction ? ?Standing with a theraband anchored at chest height, begin with arm straight and some tension in the band. Move your arm out to your side (keeping straight the whole time). Bring the affected arm back to midline. ? ? ? ? ?3) Shoulder Internal Rotation ? ?While holding an elastic band at your side with your elbow bent, start with your hand away from your stomach, then pull the band towards your stomach. Keep your elbow near your side the entire time. ? ? ? ? ?4) Shoulder External Rotation ? ?While holding an elastic band at your side with your elbow bent, start with your hand near your stomach and then pull the band away. Keep your elbow at your side the entire time. ? ? ? ? ?5) Shoulder flexion ? ?While standing with back to the door, holding Theraband at hand level, raise arm in front of you.  Keep elbow straight through entire movement.  ? ? ? ? ?6) Shoulder abduction ? ?While holding an elastic band at your side, draw  up your arm to the side keeping your elbow straight. ?  ? ?

## 2023-01-15 NOTE — Therapy (Unsigned)
OUTPATIENT OCCUPATIONAL THERAPY ORTHO TREATMENT NOTE  Patient Name: Rebecca Boyd MRN: [id number] DOB:1976-02-29, 47 y.o., female Today's Date: 01/16/2023   END OF SESSION:  OT End of Session - 01/15/23 2258     Visit Number 4    Number of Visits 9    Date for OT Re-Evaluation 01/19/23    Authorization Type BC[employer]    OT Start Time 1527    OT Stop Time 1559    OT Time Calculation (min) 32 min    Activity Tolerance Patient tolerated treatment well    Behavior During Therapy [facility] for tasks assessed/performed             Past Medical History:  Diagnosis Date   Anemia    Anxiety    Asthma    Bacterial vaginosis 07/27/2014   Caffeine overuse 10/04/2022   Caffeine-induced insomnia (HCC) 10/04/2022   Cervical neck pain with evidence of disc disease 10/02/2013   Depressive disorder, not elsewhere classified    Epistaxis 07/09/2019   Family history of breast cancer    Family history of breast cancer in sister 09/25/2018   Family history of colon cancer    Family history of lung cancer    FH: breast cancer in relative when <23 years old 06/10/2018   Dx at age 72   Headache 03/16/2016   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   Initial dx is 05/2013    Hypertension    Migraine    Migraines    Miscarriage 2009   Neck pain    Nondisplaced fracture of fifth metatarsal bone, left foot, subsequent encounter for fracture with routine healing 08/15/18 09/25/2018   Pruritus 09/29/2018   Pruritus ani 09/25/2018   Rash and nonspecific skin eruption 05/14/2017   Vaginitis    cyctitis   Past Surgical History:  Procedure Laterality Date   BIOPSY  07/07/2022   Procedure: BIOPSY;  Surgeon: Lanelle Bal, DO;  [facility];  Service: Endoscopy;;   cervical cryotherapy N/A 1999   COLONOSCOPY WITH PROPOFOL N/A 05/10/2021   Procedure: COLONOSCOPY WITH PROPOFOL;  Surgeon: Lanelle Bal, DO;  [facility]: [facility];  Service: Endoscopy;  Laterality: N/A;  11:30am   DILITATION &  CURRETTAGE/HYSTROSCOPY WITH THERMACHOICE ABLATION  08/26/2013   Procedure: DILATATION & CURETTAGE/HYSTEROSCOPY WITH THERMACHOICE ENDOMETRIAL ABLATION Procedure #2 Total Therapy Time=min       sec;  Surgeon: Tilda Burrow, MD;  Location: [facility];  Service: Gynecology;;   ESOPHAGOGASTRODUODENOSCOPY (EGD) WITH PROPOFOL N/A 07/07/2022   Procedure: ESOPHAGOGASTRODUODENOSCOPY (EGD) WITH PROPOFOL;  Surgeon: Lanelle Bal, DO;  [facility];  Service: Endoscopy;  Laterality: N/A;  245pm, asa 2   LAPAROSCOPIC BILATERAL SALPINGECTOMY Bilateral 08/26/2013   Procedure: LAPAROSCOPIC BILATERAL SALPINGECTOMY AND REMOVAL OF LEFT PERITUBAL CYST Procedure #1;  Surgeon: Tilda Burrow, MD;  Location: [facility];  Service: Gynecology;  Laterality: Bilateral;   LAPAROSCOPIC LYSIS OF ADHESIONS  08/26/2013   Procedure: LAPAROSCOPIC LYSIS OF ADHESIONS Procedure #1;  Surgeon: Tilda Burrow, MD;  Location: [facility];  Service: Gynecology;;   POLYPECTOMY  05/10/2021   Procedure: POLYPECTOMY;  Surgeon: Lanelle Bal, DO;  [facility];  Service: Endoscopy;;   POLYPECTOMY  07/07/2022   Procedure: POLYPECTOMY;  Surgeon: Lanelle Bal, DO;  [facility];  Service: Endoscopy;;   VULVAR LESION REMOVAL  08/26/2013   Procedure: REMOVAL OF VULVAR SEBACEOUS CYST Procedure #3;  Surgeon: Tilda Burrow, MD;  Location: [facility];  Service: Gynecology;;  Patient Active Problem List   Diagnosis Date Noted   Bacterial vaginal infection 11/22/2022   Moderate persistent asthma without complication 11/22/2022   Shift work sleep disorder 10/04/2022   OSA (obstructive sleep apnea) not on CPAP 10/04/2022   Hyperpigmentation 08/28/2022   Nodule of buttock 06/19/2022   Abnormal CXR 05/07/2022   Asthma exacerbation 04/27/2022   Syncope 04/27/2022   Moderate episode of recurrent major depressive disorder (HCC) 12/29/2021   GAD (generalized anxiety disorder) 12/29/2021   Left hip pain 10/02/2021    Dyslipidemia 08/25/2021   Prediabetes 08/25/2021   Low vitamin D level 07/07/2021   Elevated lipase 03/31/2021   FH: colon cancer 02/18/2021   Numbness and tingling of left hand 06/08/2020   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   Asthma 10/29/2019   Hypokalemia 07/11/2019   Family history of lung cancer    Rash and nonspecific skin eruption 05/14/2017   Urinary frequency 02/19/2017   Migraine syndrome 12/06/2016   Annual physical exam 11/11/2015   Morbid obesity due to excess calories (HCC) 03/17/2015   Essential hypertension 01/07/2014   Flank pain 08/31/2013   Pain in thoracic spine 08/07/2013   Low back pain with radiation 07/30/2013   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   Vitamin D deficiency 10/27/2010   Allergic rhinitis 12/01/2008   Amenorrhea 02/12/2008   Neck pain 07/04/2007    PCP: Kerri Perches, MD REFERRING PROVIDER: Ty Hilts, MD  ONSET DATE: ~4 months  REFERRING DIAG: Chronic R shoulder Pain  THERAPY DIAG:  Chronic right shoulder pain  Shoulder stiffness, right  Other symptoms and signs involving the musculoskeletal system  Rationale for Evaluation and Treatment: Rehabilitation  SUBJECTIVE:   SUBJECTIVE STATEMENT: "I haven't done too bad recently." Pt accompanied by: self  PERTINENT HISTORY: Pt has been having severe pain for 4 months with no clear start to her pain. MRI indicates quadrilateral space syndrome, along with non operative bicep tendinosis and iterstitial tears of the rotator cuff.   PRECAUTIONS: Shoulder  WEIGHT BEARING RESTRICTIONS: Yes 5lbs  PAIN:  Are you having pain? Yes: NPRS scale: 2/10 Pain location: anterior shoulder Pain description: aching Aggravating factors: movement Relieving factors: nothing  FALLS: Has patient fallen in last 6 months? No  PLOF: Independent  PATIENT GOALS: I want to try strengthening and eliminating as much pain as possible.  NEXT MD VISIT: 12/25/22  OBJECTIVE:    HAND DOMINANCE: Right  ADLs: Overall ADLs: Pt reports difficulty with lifting her arm up for dressing and bathing, unable to do her hair. Additionally, on her bad days she is unable to cook or clean due to pain and weakness and requires help even on her good days.   FUNCTIONAL OUTCOME MEASURES: FOTO: 45.58  UPPER EXTREMITY ROM:       Assessed in seated, er/IR adducted  Active ROM Right eval  Shoulder flexion 144  Shoulder abduction 165  Shoulder internal rotation 90  Shoulder external rotation -4  (Blank rows = not tested)    UPPER EXTREMITY MMT:     Assessed in seated, er/IR adducted  MMT Right eval  Shoulder flexion 4/5  Shoulder abduction 4/5  Shoulder internal rotation 4+/5  Shoulder external rotation 4/5  (Blank rows = not tested)  SENSATION: WFL  EDEMA: No swelling noted  OBSERVATIONS: Moderate fascial restrictions along anterior shoulder girdle, biceps, deltoid, scapular region, and axillary region.    TODAY'S TREATMENT:  DATE:  01/15/23 -A/ROM: seated, flexion, abduction, protraction, horizontal abduction, er/IR, x15 -Proximal Shoulder Exercises: paddles, criss cross, circles both directions, x10 each -Scapular Strengthening: green band, extension, retraction, rows, x12 -Shoulder Strengthening: green band, protraction, horizontal abduction, er, IR, flexion, abduction, x12  01/09/23 -Manual Therapy: myofascial release and trigger point applied to the bicep, trapezius, scapular region, and pectoralis, in order to reduce pain, fascial restrictions, and improve ROM.  -A/ROM: seated, flexion, abduction, protraction, horizontal abduction, er/IR, x15 -ABC Ball On the wall -Overhead lacing -Therapy Ball Exercises: flexion, protraction, overhead press, V ups, Circles both directions, x10 -[facility]: flexion, abduction,  x10  01/02/23 -Manual Therapy: myofascial release and trigger point applied to the bicep, trapezius, scapular region, and pectoralis, in order to reduce pain, fascial restrictions, and improve ROM.  -A/ROM: supine, flexion, abduction, protraction, horizontal abduction, er/IR, x12 -Isometrics: Flexion, abduction, extension, ir, 4x15" -Scapular Strengthening: Green band, extension, retraction, rows, x12 -A/ROM: standing, flexion, abduction, protraction, horizontal abduction, er/IR, x10 -Ball Rolls: flexion, abduction, x10   PATIENT EDUCATION: Education details: Publishing rights manager Person educated: Patient Education method: Explanation, Demonstration, and Handouts Education comprehension: verbalized understanding and returned demonstration  HOME EXERCISE PROGRAM: 8/16: A/ROM 9/3: Isometrics 9/10: Therapy Ball Exercises 9/16: Scapular Strengthening  GOALS: Goals reviewed with patient? Yes   SHORT TERM GOALS: Target date: 01/19/23  Pt will be provided with and educated on HEP to improve mobility in RUE required for use during ADL completion.   Goal status: IN PROGRESS  Pt will decrease pain in RUE to 3/10 or less to improve ability to sleep for 2+ consecutive hours without waking due to pain.   Goal status: IN PROGRESS  2.  Pt will decrease RUE fascial restrictions to min amounts or less to improve mobility required for functional reaching tasks.   Goal status: IN PROGRESS  3.  Pt will increase RUE A/ROM by 10 degrees to improve ability to use RUE when reaching overhead or behind back during dressing and bathing tasks.   Goal status: IN PROGRESS  4.  Pt will increase RUE strength to 5/5 or greater to improve ability to use RUE when lifting or carrying items during meal preparation/housework/yardwork tasks.   Goal status: IN PROGRESS  5.  Pt will return to highest level of function using RUE as dominant during functional task completion.   Goal status: IN  PROGRESS   ASSESSMENT:  CLINICAL IMPRESSION: Pt had a shortened session due to needing to leave for another appointment. Session focused on exercises for strengthening and endurance. OT added shoulder strengthening and upgraded her theraband's to a green band, which pt was able to complete with short rest breaks in between most exercises. Verbal and tactile cuing provided for positioning and technique throughout session.   PERFORMANCE DEFICITS: in functional skills including in functional skills including ADLs, IADLs, coordination, tone, ROM, strength, pain, fascial restrictions, muscle spasms, and UE functional use.    PLAN:  OT FREQUENCY: 2x/week  OT DURATION: 4 weeks  PLANNED INTERVENTIONS: self care/ADL training, therapeutic exercise, therapeutic activity, neuromuscular re-education, manual therapy, passive range of motion, splinting, electrical stimulation, ultrasound, moist heat, cryotherapy, patient/family education, and DME and/or AE instructions  RECOMMENDED OTHER SERVICES: N/A  CONSULTED AND AGREED WITH PLAN OF CARE: Patient  PLAN FOR NEXT SESSION: Manual Therapy, A/ROM, Scapular strengthening, Isometrics, Bicep loading   Trish Mage, OTR/L [facility] [phone] [address], OT 01/16/2023, 10:59 PM

## 2023-01-15 NOTE — Progress Notes (Signed)
   IN-PERSON  THERAPIST PROGRESS NOTE  Session Time:  Monday  01/15/2023 4:14 PM - 4:55 PM   Participation Level: Active  Behavioral Response: AlertAnxious  Type of Therapy: Individual Therapy  Treatment Goals addressed: Jnai will score less than 5 on the Generalized Anxiety Disorder 7 Scale (GAD-7  Pt will learn 3 relaxation techniques and practice a relaxation technique daily    ProgressTowards Goals: Not progressing   Interventions: CBT and Supportive  Summary: Rebecca Boyd is a 47 y.o. female who presents with  Pt is referred for services by PCP Dr. Lodema Hong due to pt experiencing symptoms of anxiety and depression. She is a returning pt to this clinician and last was seen in 2022, She also saw psychiatrist Dr. Vanetta Shawl  in the past. She denies any psychiatric hospitalizations.  Patient reports crying a lot, spending money she does not have becoming stressed out about how to pay her bills.  She also reports grief and loss issues regarding her aunt dying in July 2023.  Per patient's report this and use to help her and was her backbone.  She also reports grief and loss issues regarding her boyfriend dying of cancer in November 2022.  Symptoms include crying spells, anxiety, excessive worry, mood swings, irritability, restlessness, and overconfidence   Patient last was seen about months ago.  She continues to experience symptoms of anxiety as reflected in the GAD-7.  She reports still experiencing grief and loss issues. She reports coping  by praying and then just not thinking about it. She continues to report becoming angry easily especially when family members are requesting she do things like provide transportation. She admits being verbally aggressive. She continues to experience impulsive and expensive spending. She then experiences more anxiety as she is unable to pay her bills. She reports continued medication compliance.   Suicidal/Homicidal: Nowithout  intent/plan  Therapist Response: Reviewed symptoms, administered GAD-7, discussed results,began to discuss how grief may be affecting patient, began to provide psychoeducation on the dual process of grief, discussed rationale for and provided instructions for beach visualization to manage stress and anxiety, developed plan with pt to practice between sessions, checked out interactive audio to pt and provided with access code,  provided psychoeducation on urge surfing and discussed ways to intervene to avoid giving in to urge, developed plan with pt to use strategies discussed in session, also provided with handout,  Plan: Return again in 2 weeks.  Diagnosis: Moderate episode of recurrent major depressive disorder (HCC)  Collaboration of Care: Psychiatrist AEB patient sees psychiatrist Dr. Adrian Blackwater in this practice  Patient/Guardian was advised Release of Information must be obtained prior to any record release in order to collaborate their care with an outside provider. Patient/Guardian was advised if they have not already done so to contact the registration department to sign all necessary forms in order for [place] to release information regarding their care.   Consent: Patient/Guardian gives verbal consent for treatment and assignment of benefits for services provided during this visit. Patient/Guardian expressed understanding and agreed to proceed.   Adah Salvage, LCSW 01/15/2023

## 2023-01-17 ENCOUNTER — Encounter (HOSPITAL_COMMUNITY): Payer: Self-pay | Admitting: Occupational Therapy

## 2023-01-17 ENCOUNTER — Ambulatory Visit (HOSPITAL_COMMUNITY): Payer: BC Managed Care – PPO | Admitting: Occupational Therapy

## 2023-01-17 ENCOUNTER — Other Ambulatory Visit: Payer: Self-pay

## 2023-01-17 ENCOUNTER — Encounter: Payer: Self-pay | Admitting: Family Medicine

## 2023-01-17 DIAGNOSIS — R29898 Other symptoms and signs involving the musculoskeletal system: Secondary | ICD-10-CM | POA: Diagnosis not present

## 2023-01-17 DIAGNOSIS — G8929 Other chronic pain: Secondary | ICD-10-CM | POA: Diagnosis not present

## 2023-01-17 DIAGNOSIS — M25611 Stiffness of right shoulder, not elsewhere classified: Secondary | ICD-10-CM | POA: Diagnosis not present

## 2023-01-17 DIAGNOSIS — M25511 Pain in right shoulder: Secondary | ICD-10-CM | POA: Diagnosis not present

## 2023-01-17 MED ORDER — METRONIDAZOLE 0.75 % VA GEL: 1.0000 | Freq: Two times a day (BID) | VAGINAL | 2 refills | Status: DC

## 2023-01-17 NOTE — Patient Instructions (Signed)

## 2023-01-17 NOTE — Therapy (Signed)
OUTPATIENT OCCUPATIONAL THERAPY ORTHO TREATMENT NOTE REASSESSMENT/RECERTIFICATION  Patient Name: Rebecca Boyd MRN: [id number] DOB:[birth year], 47 y.o., female Today's Date: 01/19/2023   END OF SESSION:   01/17/23 1646  OT Visits / Re-Eval  Visit Number 5  Number of Visits 9  Date for OT Re-Evaluation 02/23/23  Authorization  Authorization Type BC[employer]  OT Time Calculation  OT Start Time 1606  OT Stop Time 1646  OT Time Calculation (min) 40 min  End of Session  Activity Tolerance Patient tolerated treatment well  Behavior During Therapy [facility] for tasks assessed/performed     Past Medical History:  Diagnosis Date   Anemia    Anxiety    Asthma    Bacterial vaginosis 07/27/2014   Caffeine overuse 10/04/2022   Caffeine-induced insomnia (HCC) 10/04/2022   Cervical neck pain with evidence of disc disease 10/02/2013   Depressive disorder, not elsewhere classified    Epistaxis 07/09/2019   Family history of breast cancer    Family history of breast cancer in sister 09/25/2018   Family history of colon cancer    Family history of lung cancer    FH: breast cancer in relative when <53 years old 06/10/2018   Dx at age 3   Headache 03/16/2016   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   Initial dx is 05/2013    Hypertension    Migraine    Migraines    Miscarriage 2009   Neck pain    Nondisplaced fracture of fifth metatarsal bone, left foot, subsequent encounter for fracture with routine healing 08/15/18 09/25/2018   Pruritus 09/29/2018   Pruritus ani 09/25/2018   Rash and nonspecific skin eruption 05/14/2017   Vaginitis    cyctitis   Past Surgical History:  Procedure Laterality Date   BIOPSY  07/07/2022   Procedure: BIOPSY;  Surgeon: Lanelle Bal, DO;  [facility];  Service: Endoscopy;;   cervical cryotherapy N/A 1999   COLONOSCOPY WITH PROPOFOL N/A 05/10/2021   Procedure: COLONOSCOPY WITH PROPOFOL;  Surgeon: Lanelle Bal, DO;  Location: [facility];   Service: Endoscopy;  Laterality: N/A;  11:30am   DILITATION & CURRETTAGE/HYSTROSCOPY WITH THERMACHOICE ABLATION  08/26/2013   Procedure: DILATATION & CURETTAGE/HYSTEROSCOPY WITH THERMACHOICE ENDOMETRIAL ABLATION Procedure #2 Total Therapy Time=min       sec;  Surgeon: Tilda Burrow, MD;  Location: [facility];  Service: Gynecology;;   ESOPHAGOGASTRODUODENOSCOPY (EGD) WITH PROPOFOL N/A 07/07/2022   Procedure: ESOPHAGOGASTRODUODENOSCOPY (EGD) WITH PROPOFOL;  Surgeon: Lanelle Bal, DO;  [facility];  Service: Endoscopy;  Laterality: N/A;  245pm, asa 2   LAPAROSCOPIC BILATERAL SALPINGECTOMY Bilateral 08/26/2013   Procedure: LAPAROSCOPIC BILATERAL SALPINGECTOMY AND REMOVAL OF LEFT PERITUBAL CYST Procedure #1;  Surgeon: Tilda Burrow, MD;  Location: [facility];  Service: Gynecology;  Laterality: Bilateral;   LAPAROSCOPIC LYSIS OF ADHESIONS  08/26/2013   Procedure: LAPAROSCOPIC LYSIS OF ADHESIONS Procedure #1;  Surgeon: Tilda Burrow, MD;  Location: [facility];  Service: Gynecology;;   POLYPECTOMY  05/10/2021   Procedure: POLYPECTOMY;  Surgeon: Lanelle Bal, DO;  [facility];  Service: Endoscopy;;   POLYPECTOMY  07/07/2022   Procedure: POLYPECTOMY;  Surgeon: Lanelle Bal, DO;  [facility];  Service: Endoscopy;;   VULVAR LESION REMOVAL  08/26/2013   Procedure: REMOVAL OF VULVAR SEBACEOUS CYST Procedure #3;  Surgeon: Tilda Burrow, MD;  Location: [facility];  Service: Gynecology;;   Patient Active Problem List   Diagnosis Date Noted   Bacterial vaginal  infection 11/22/2022   Moderate persistent asthma without complication 11/22/2022   Shift work sleep disorder 10/04/2022   OSA (obstructive sleep apnea) not on CPAP 10/04/2022   Hyperpigmentation 08/28/2022   Nodule of buttock 06/19/2022   Abnormal CXR 05/07/2022   Asthma exacerbation 04/27/2022   Syncope 04/27/2022   Moderate episode of recurrent major depressive disorder (HCC) 12/29/2021   GAD (generalized anxiety  disorder) 12/29/2021   Left hip pain 10/02/2021   Dyslipidemia 08/25/2021   Prediabetes 08/25/2021   Low vitamin D level 07/07/2021   Elevated lipase 03/31/2021   FH: colon cancer 02/18/2021   Numbness and tingling of left hand 06/08/2020   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   Asthma 10/29/2019   Hypokalemia 07/11/2019   Family history of lung cancer    Rash and nonspecific skin eruption 05/14/2017   Urinary frequency 02/19/2017   Migraine syndrome 12/06/2016   Annual physical exam 11/11/2015   Morbid obesity due to excess calories (HCC) 03/17/2015   Essential hypertension 01/07/2014   Flank pain 08/31/2013   Pain in thoracic spine 08/07/2013   Low back pain with radiation 07/30/2013   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   Vitamin D deficiency 10/27/2010   Allergic rhinitis 12/01/2008   Amenorrhea 02/12/2008   Neck pain 07/04/2007    PCP: Kerri Perches, MD REFERRING PROVIDER: Ty Hilts, MD  ONSET DATE: ~4 months  REFERRING DIAG: Chronic R shoulder Pain  THERAPY DIAG:  Chronic right shoulder pain  Shoulder stiffness, right  Other symptoms and signs involving the musculoskeletal system  Rationale for Evaluation and Treatment: Rehabilitation  SUBJECTIVE:   SUBJECTIVE STATEMENT: "I haven't done too bad recently." Pt accompanied by: self  PERTINENT HISTORY: Pt has been having severe pain for 4 months with no clear start to her pain. MRI indicates quadrilateral space syndrome, along with non operative bicep tendinosis and iterstitial tears of the rotator cuff.   PRECAUTIONS: Shoulder  WEIGHT BEARING RESTRICTIONS: Yes 5lbs  PAIN:  Are you having pain? Yes: NPRS scale: 2/10 Pain location: anterior shoulder Pain description: aching Aggravating factors: movement Relieving factors: nothing  FALLS: Has patient fallen in last 6 months? No  PLOF: Independent  PATIENT GOALS: I want to try strengthening and eliminating as much pain  as possible.  NEXT MD VISIT: 12/25/22  OBJECTIVE:   HAND DOMINANCE: Right  ADLs: Overall ADLs: Pt reports difficulty with lifting her arm up for dressing and bathing, unable to do her hair. Additionally, on her bad days she is unable to cook or clean due to pain and weakness and requires help even on her good days.   FUNCTIONAL OUTCOME MEASURES: FOTO: 45.58 01/17/23: 64.47  UPPER EXTREMITY ROM:       Assessed in seated, er/IR adducted  Active ROM Right eval Right 01/17/23  Shoulder flexion 144 163  Shoulder abduction 165 169  Shoulder internal rotation 90 90  Shoulder external rotation -4 74  (Blank rows = not tested)    UPPER EXTREMITY MMT:     Assessed in seated, er/IR adducted  MMT Right eval Right 01/17/23  Shoulder flexion 4/5 4+/5  Shoulder abduction 4/5 4+/5  Shoulder internal rotation 4+/5 5/5  Shoulder external rotation 4/5 4+/5  (Blank rows = not tested)  SENSATION: WFL  EDEMA: No swelling noted  OBSERVATIONS: Moderate fascial restrictions along anterior shoulder girdle, biceps, deltoid, scapular region, and axillary region.    TODAY'S TREATMENT:  DATE:  01/17/23 -Manual Therapy: myofascial release and trigger point applied to the bicep, trapezius, scapular region, and pectoralis, in order to reduce pain, fascial restrictions, and improve ROM.  -A/ROM: seated, flexion, abduction, protraction, horizontal abduction, er/IR, x15 -Shoulder Strengthening: 2lb dumbbells, flexion, abduction, protraction, horizontal abduction, er/IR, x15 -PNF Strengthening: green band, chest pulls, overhead pulls, er pulls, PNF up, PNF down, x10 -Measurements for reassessment  01/15/23 -A/ROM: seated, flexion, abduction, protraction, horizontal abduction, er/IR, x15 -Proximal Shoulder Exercises: paddles, criss cross, circles both directions, x10  each -Scapular Strengthening: green band, extension, retraction, rows, x12 -Shoulder Strengthening: green band, protraction, horizontal abduction, er, IR, flexion, abduction, x12  01/09/23 -Manual Therapy: myofascial release and trigger point applied to the bicep, trapezius, scapular region, and pectoralis, in order to reduce pain, fascial restrictions, and improve ROM.  -A/ROM: seated, flexion, abduction, protraction, horizontal abduction, er/IR, x15 -ABC Ball On the wall -Overhead lacing -Therapy Ball Exercises: flexion, protraction, overhead press, V ups, Circles both directions, x10 -Ball Rolls: flexion, abduction, x10   PATIENT EDUCATION: Education details: PNF Strengthening Person educated: Patient Education method: Programmer, multimedia, Demonstration, and Handouts Education comprehension: verbalized understanding and returned demonstration  HOME EXERCISE PROGRAM: 8/16: A/ROM 9/3: Isometrics 9/10: Therapy Ball Exercises 9/16: Scapular Strengthening 9/18: PNF Strengthening  GOALS: Goals reviewed with patient? Yes   SHORT TERM GOALS: Target date: 01/19/23  Pt will be provided with and educated on HEP to improve mobility in RUE required for use during ADL completion.   Goal status: IN PROGRESS  Pt will decrease pain in RUE to 3/10 or less to improve ability to sleep for 2+ consecutive hours without waking due to pain.   Goal status: IN PROGRESS  2.  Pt will decrease RUE fascial restrictions to min amounts or less to improve mobility required for functional reaching tasks.   Goal status: IN PROGRESS  3.  Pt will increase RUE A/ROM by 10 degrees to improve ability to use RUE when reaching overhead or behind back during dressing and bathing tasks.   Goal status: IN PROGRESS  4.  Pt will increase RUE strength to 5/5 or greater to improve ability to use RUE when lifting or carrying items during meal preparation/housework/yardwork tasks.   Goal status: IN PROGRESS  5.  Pt will  return to highest level of function using RUE as dominant during functional task completion.   Goal status: IN PROGRESS   ASSESSMENT:  CLINICAL IMPRESSION: This session pt completed reassessment for recertification. She demonstrated improving ROM and strength, however continues to experience intermittent significant pain, which limits her mobility at that time. This session pt completed mobility exercises and strengthening based exercises to continue improving strength and stability in her shoulder. OT providing verbal and tactile cuing for positioning and technique. Additionally, OT requesting 4 more visits at 1 time per week to continue working on strength and stability.   PERFORMANCE DEFICITS: in functional skills including in functional skills including ADLs, IADLs, coordination, tone, ROM, strength, pain, fascial restrictions, muscle spasms, and UE functional use.    PLAN:  OT FREQUENCY: 2x/week  OT DURATION: 4 weeks  PLANNED INTERVENTIONS: self care/ADL training, therapeutic exercise, therapeutic activity, neuromuscular re-education, manual therapy, passive range of motion, splinting, electrical stimulation, ultrasound, moist heat, cryotherapy, patient/family education, and DME and/or AE instructions  RECOMMENDED OTHER SERVICES: N/A  CONSULTED AND AGREED WITH PLAN OF CARE: Patient  PLAN FOR NEXT SESSION: Manual Therapy, A/ROM, Scapular strengthening, Isometrics, Bicep loading   Lincon Sahlin Bing Plume, OTR/L [facility] [phone] Jabari Swoveland  Rosemarie Beath, OT 01/19/2023, 10:01 AM

## 2023-01-19 ENCOUNTER — Ambulatory Visit (HOSPITAL_BASED_OUTPATIENT_CLINIC_OR_DEPARTMENT_OTHER): Payer: BC Managed Care – PPO | Admitting: Pulmonary Disease

## 2023-01-22 ENCOUNTER — Encounter (HOSPITAL_COMMUNITY): Payer: BC Managed Care – PPO | Admitting: Occupational Therapy

## 2023-01-29 ENCOUNTER — Ambulatory Visit (HOSPITAL_COMMUNITY): Payer: BC Managed Care – PPO | Admitting: Psychiatry

## 2023-01-29 DIAGNOSIS — F331 Major depressive disorder, recurrent, moderate: Secondary | ICD-10-CM | POA: Diagnosis not present

## 2023-01-29 DIAGNOSIS — F411 Generalized anxiety disorder: Secondary | ICD-10-CM | POA: Diagnosis not present

## 2023-01-29 NOTE — Progress Notes (Signed)
IN-PERSON  THERAPIST PROGRESS NOTE  Session Time:  Monday  01/29/2023 11:08 AM  -  12:01 PM   Participation Level: Active  Behavioral Response: AlertAnxious  Type of Therapy: Individual Therapy  Treatment Goals addressed: Marbeth will score less than 5 on the Generalized Anxiety Disorder 7 Scale (GAD-7  Pt will learn 3 relaxation techniques and practice a relaxation technique daily    ProgressTowards Goals: Not progressing   Interventions: CBT and Supportive  Summary: Rebecca Boyd is a 47 y.o. female who presents with  Pt is referred for services by PCP Dr. Lodema Hong due to pt experiencing symptoms of anxiety and depression. She is a returning pt to this clinician and last was seen in 2022, She also saw psychiatrist Dr. Vanetta Shawl  in the past. She denies any psychiatric hospitalizations.  Patient reports crying a lot, spending money she does not have becoming stressed out about how to pay her bills.  She also reports grief and loss issues regarding her aunt dying in July 2023.  Per patient's report this and use to help her and was her backbone.  She also reports grief and loss issues regarding her boyfriend dying of cancer in November 2022.  Symptoms include crying spells, anxiety, excessive worry, mood swings, irritability, restlessness, and overconfidence   Patient last was seen about 2 weeks ago.  She continues to experience symptoms of anxiety as reflected in the GAD-7.  She still reports excessive spending as a source of anxiety.  She tried to implement strategies using urge surfing and reports some reduction in spending.  She reports additional stress related to her mother recently being hospitalized due COPD flare.  Patient reports going back and forth to the hospital while also working.  She is working 7 days a week and reports having difficulty finding time for rest or sleep.  Mother now is home and is doing better.  However, patient reports constant worry about her mother as  well as her sister.  She has difficulty scheduling time for self as she reports they constantly call.  She feels compelled to answer their calls as she fears something bad will happen.  Patient also begins to talk more about her deceased boyfriend in session and thinks she now is ready to begin going on grief and loss issues.   Suicidal/Homicidal: Nowithout intent/plan  Therapist Response: Reviewed symptoms, administered GAD-7, discussed results, praised and reinforced patient's efforts to use urge surfing, discussed results, discussed stressors, facilitated expression of thoughts and feelings, validated feelings, assisted patient examine her worry thoughts, began to discuss grief and loss issues regarding the death of her boyfriend, began to discuss possible next steps for treatment regarding addressing grief and loss issues, discussed rationale for and develop plan with patient to practice progressive muscle relaxation to help manage stress and anxiety, checked out interactive audio activity to patient and provided with access code, developed plan with patient to practice PMR between sessions, for also encouraged patient to follow through with her plan to schedule time herself on her next day of work   plan: Return again in 2 weeks.  Diagnosis: Moderate episode of recurrent major depressive disorder (HCC)  GAD (generalized anxiety disorder)  Collaboration of Care: Psychiatrist AEB patient sees psychiatrist Dr. Adrian Blackwater in this practice  Patient/Guardian was advised Release of Information must be obtained prior to any record release in order to collaborate their care with an outside provider. Patient/Guardian was advised if they have not already done so to contact the registration  department to sign all necessary forms in order for [place] to release information regarding their care.   Consent: Patient/Guardian gives verbal consent for treatment and assignment of benefits for services provided during this  visit. Patient/Guardian expressed understanding and agreed to proceed.   Adah Salvage, LCSW 01/29/2023

## 2023-01-29 NOTE — Addendum Note (Signed)
Addended by: Florencia Reasons E on: 01/29/2023 12:22 PM   Modules accepted: Level of Service

## 2023-02-01 ENCOUNTER — Encounter (HOSPITAL_COMMUNITY): Payer: Self-pay | Admitting: Occupational Therapy

## 2023-02-01 ENCOUNTER — Telehealth (HOSPITAL_COMMUNITY): Payer: BC Managed Care – PPO | Admitting: Psychiatry

## 2023-02-01 ENCOUNTER — Ambulatory Visit (HOSPITAL_COMMUNITY): Payer: BC Managed Care – PPO | Attending: Orthopedic Surgery | Admitting: Occupational Therapy

## 2023-02-01 ENCOUNTER — Encounter (HOSPITAL_COMMUNITY): Payer: Self-pay | Admitting: Psychiatry

## 2023-02-01 DIAGNOSIS — F331 Major depressive disorder, recurrent, moderate: Secondary | ICD-10-CM

## 2023-02-01 DIAGNOSIS — G8929 Other chronic pain: Secondary | ICD-10-CM | POA: Insufficient documentation

## 2023-02-01 DIAGNOSIS — G4726 Circadian rhythm sleep disorder, shift work type: Secondary | ICD-10-CM

## 2023-02-01 DIAGNOSIS — R29898 Other symptoms and signs involving the musculoskeletal system: Secondary | ICD-10-CM | POA: Diagnosis not present

## 2023-02-01 DIAGNOSIS — M25511 Pain in right shoulder: Secondary | ICD-10-CM | POA: Diagnosis not present

## 2023-02-01 DIAGNOSIS — M25611 Stiffness of right shoulder, not elsewhere classified: Secondary | ICD-10-CM | POA: Diagnosis not present

## 2023-02-01 DIAGNOSIS — F411 Generalized anxiety disorder: Secondary | ICD-10-CM

## 2023-02-01 NOTE — Therapy (Signed)
OUTPATIENT OCCUPATIONAL THERAPY ORTHO TREATMENT NOTE REASSESSMENT/RECERTIFICATION  Patient Name: Rebecca Boyd MRN: [id number] DOB:1975-10-18, 47 y.o., female Today's Date: 02/01/2023   END OF SESSION:  OT End of Session - 02/01/23 1450     Visit Number 6    Number of Visits 9    Date for OT Re-Evaluation 02/23/23    Authorization Type [employer]    OT Start Time 1352    OT Stop Time 1432    OT Time Calculation (min) 40 min    Activity Tolerance Patient tolerated treatment well    Behavior During Therapy [facility] for tasks assessed/performed             Past Medical History:  Diagnosis Date   Anemia    Anxiety    Asthma    Bacterial vaginosis 07/27/2014   Caffeine overuse 10/04/2022   Caffeine-induced insomnia (HCC) 10/04/2022   Cervical neck pain with evidence of disc disease 10/02/2013   Depressive disorder, not elsewhere classified    Epistaxis 07/09/2019   Family history of breast cancer    Family history of breast cancer in sister 09/25/2018   Family history of colon cancer    Family history of lung cancer    FH: breast cancer in relative when <81 years old 06/10/2018   Dx at age 88   Headache 03/16/2016   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   Initial dx is 05/2013    Hypertension    Migraine    Migraines    Miscarriage 2009   Neck pain    Nondisplaced fracture of fifth metatarsal bone, left foot, subsequent encounter for fracture with routine healing 08/15/18 09/25/2018   Pruritus 09/29/2018   Pruritus ani 09/25/2018   Rash and nonspecific skin eruption 05/14/2017   Vaginitis    cyctitis   Past Surgical History:  Procedure Laterality Date   BIOPSY  07/07/2022   Procedure: BIOPSY;  Surgeon: Lanelle Bal, DO;  [facility];  Service: Endoscopy;;   cervical cryotherapy N/A 1999   COLONOSCOPY WITH PROPOFOL N/A 05/10/2021   Procedure: COLONOSCOPY WITH PROPOFOL;  Surgeon: Lanelle Bal, DO;  [facility]: [facility];  Service: Endoscopy;  Laterality:  N/A;  11:30am   DILITATION & CURRETTAGE/HYSTROSCOPY WITH THERMACHOICE ABLATION  08/26/2013   Procedure: DILATATION & CURETTAGE/HYSTEROSCOPY WITH THERMACHOICE ENDOMETRIAL ABLATION Procedure #2 Total Therapy Time=min       sec;  Surgeon: Tilda Burrow, MD;  Location: [facility];  Service: Gynecology;;   ESOPHAGOGASTRODUODENOSCOPY (EGD) WITH PROPOFOL N/A 07/07/2022   Procedure: ESOPHAGOGASTRODUODENOSCOPY (EGD) WITH PROPOFOL;  Surgeon: Lanelle Bal, DO;  [facility];  Service: Endoscopy;  Laterality: N/A;  245pm, asa 2   LAPAROSCOPIC BILATERAL SALPINGECTOMY Bilateral 08/26/2013   Procedure: LAPAROSCOPIC BILATERAL SALPINGECTOMY AND REMOVAL OF LEFT PERITUBAL CYST Procedure #1;  Surgeon: Tilda Burrow, MD;  Location: [facility];  Service: Gynecology;  Laterality: Bilateral;   LAPAROSCOPIC LYSIS OF ADHESIONS  08/26/2013   Procedure: LAPAROSCOPIC LYSIS OF ADHESIONS Procedure #1;  Surgeon: Tilda Burrow, MD;  Location: [facility];  Service: Gynecology;;   POLYPECTOMY  05/10/2021   Procedure: POLYPECTOMY;  Surgeon: Lanelle Bal, DO;  [facility]: [facility];  Service: Endoscopy;;   POLYPECTOMY  07/07/2022   Procedure: POLYPECTOMY;  Surgeon: Lanelle Bal, DO;  [facility];  Service: Endoscopy;;   VULVAR LESION REMOVAL  08/26/2013   Procedure: REMOVAL OF VULVAR SEBACEOUS CYST Procedure #3;  Surgeon: Tilda Burrow, MD;  Location: [facility];  Service:  Gynecology;;   Patient Active Problem List   Diagnosis Date Noted   Bacterial vaginal infection 11/22/2022   Moderate persistent asthma without complication 11/22/2022   Shift work sleep disorder 10/04/2022   OSA (obstructive sleep apnea) not on CPAP 10/04/2022   Hyperpigmentation 08/28/2022   Nodule of buttock 06/19/2022   Abnormal CXR 05/07/2022   Asthma exacerbation 04/27/2022   Syncope 04/27/2022   Moderate episode of recurrent major depressive disorder (HCC) 12/29/2021   GAD (generalized anxiety disorder) 12/29/2021   Left hip  pain 10/02/2021   Dyslipidemia 08/25/2021   Prediabetes 08/25/2021   Low vitamin D level 07/07/2021   Elevated lipase 03/31/2021   FH: colon cancer 02/18/2021   Numbness and tingling of left hand 06/08/2020   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   Asthma 10/29/2019   Hypokalemia 07/11/2019   Family history of lung cancer    Rash and nonspecific skin eruption 05/14/2017   Urinary frequency 02/19/2017   Migraine syndrome 12/06/2016   Annual physical exam 11/11/2015   Morbid obesity due to excess calories (HCC) 03/17/2015   Essential hypertension 01/07/2014   Flank pain 08/31/2013   Pain in thoracic spine 08/07/2013   Low back pain with radiation 07/30/2013   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   Vitamin D deficiency 10/27/2010   Allergic rhinitis 12/01/2008   Amenorrhea 02/12/2008   Neck pain 07/04/2007    PCP: Kerri Perches, MD REFERRING PROVIDER: Ty Hilts, MD  ONSET DATE: ~4 months  REFERRING DIAG: Chronic R shoulder Pain  THERAPY DIAG:  Chronic right shoulder pain  Shoulder stiffness, right  Other symptoms and signs involving the musculoskeletal system  Rationale for Evaluation and Treatment: Rehabilitation  SUBJECTIVE:   SUBJECTIVE STATEMENT: "Even my left arm is really hurting today" Pt accompanied by: self  PERTINENT HISTORY: Pt has been having severe pain for 4 months with no clear start to her pain. MRI indicates quadrilateral space syndrome, along with non operative bicep tendinosis and iterstitial tears of the rotator cuff.   PRECAUTIONS: Shoulder  WEIGHT BEARING RESTRICTIONS: Yes 5lbs  PAIN:  Are you having pain? Yes: NPRS scale: 5/10 Pain location: anterior shoulder Pain description: aching Aggravating factors: movement Relieving factors: nothing  FALLS: Has patient fallen in last 6 months? No  PLOF: Independent  PATIENT GOALS: I want to try strengthening and eliminating as much pain as possible.  NEXT MD  VISIT: 12/25/22  OBJECTIVE:   HAND DOMINANCE: Right  ADLs: Overall ADLs: Pt reports difficulty with lifting her arm up for dressing and bathing, unable to do her hair. Additionally, on her bad days she is unable to cook or clean due to pain and weakness and requires help even on her good days.   FUNCTIONAL OUTCOME MEASURES: FOTO: 45.58 01/17/23: 64.47  UPPER EXTREMITY ROM:       Assessed in seated, er/IR adducted  Active ROM Right eval Right 01/17/23  Shoulder flexion 144 163  Shoulder abduction 165 169  Shoulder internal rotation 90 90  Shoulder external rotation -4 74  (Blank rows = not tested)    UPPER EXTREMITY MMT:     Assessed in seated, er/IR adducted  MMT Right eval Right 01/17/23  Shoulder flexion 4/5 4+/5  Shoulder abduction 4/5 4+/5  Shoulder internal rotation 4+/5 5/5  Shoulder external rotation 4/5 4+/5  (Blank rows = not tested)  SENSATION: WFL  EDEMA: No swelling noted  OBSERVATIONS: Moderate fascial restrictions along anterior shoulder girdle, biceps, deltoid, scapular region, and axillary region.  TODAY'S TREATMENT:                                                                                                                              DATE:  02/01/23 -Manual Therapy: myofascial release and trigger point applied to the bicep, trapezius, scapular region, and pectoralis, in order to reduce pain, fascial restrictions, and improve ROM.  -A/ROM: seated, flexion, abduction, protraction, horizontal abduction, er/IR, x15 -Therapy Ball Exercises: flexion, protraction, overhead press, V ups, Circles both directions, x15 -Loop Band Exercises: wall taps, V ups, Wall Clocks, x10 -Overhead lacing  01/17/23 -Manual Therapy: myofascial release and trigger point applied to the bicep, trapezius, scapular region, and pectoralis, in order to reduce pain, fascial restrictions, and improve ROM.  -A/ROM: seated, flexion, abduction, protraction, horizontal  abduction, er/IR, x15 -Shoulder Strengthening: 2lb dumbbells, flexion, abduction, protraction, horizontal abduction, er/IR, x15 -PNF Strengthening: green band, chest pulls, overhead pulls, er pulls, PNF up, PNF down, x10 -Measurements for reassessment  01/15/23 -A/ROM: seated, flexion, abduction, protraction, horizontal abduction, er/IR, x15 -Proximal Shoulder Exercises: paddles, criss cross, circles both directions, x10 each -Scapular Strengthening: green band, extension, retraction, rows, x12 -Shoulder Strengthening: green band, protraction, horizontal abduction, er, IR, flexion, abduction, x12   PATIENT EDUCATION: Education details: Loop Band Exercises Person educated: Patient Education method: Explanation, Demonstration, and Handouts Education comprehension: verbalized understanding and returned demonstration  HOME EXERCISE PROGRAM: 8/16: A/ROM 9/3: Isometrics 9/10: Therapy Ball Exercises 9/16: Scapular Strengthening 9/18: PNF Strengthening 10/3: Loop Band Exercises  GOALS: Goals reviewed with patient? Yes   SHORT TERM GOALS: Target date: 01/19/23  Pt will be provided with and educated on HEP to improve mobility in RUE required for use during ADL completion.   Goal status: IN PROGRESS  Pt will decrease pain in RUE to 3/10 or less to improve ability to sleep for 2+ consecutive hours without waking due to pain.   Goal status: IN PROGRESS  2.  Pt will decrease RUE fascial restrictions to min amounts or less to improve mobility required for functional reaching tasks.   Goal status: IN PROGRESS  3.  Pt will increase RUE A/ROM by 10 degrees to improve ability to use RUE when reaching overhead or behind back during dressing and bathing tasks.   Goal status: IN PROGRESS  4.  Pt will increase RUE strength to 5/5 or greater to improve ability to use RUE when lifting or carrying items during meal preparation/housework/yardwork tasks.   Goal status: IN PROGRESS  5.  Pt will  return to highest level of function using RUE as dominant during functional task completion.   Goal status: IN PROGRESS   ASSESSMENT:  CLINICAL IMPRESSION: Pt having increased pain and stiffness this session. OT started with manual therapy to address moderate fascial restrictions found in the biceps and anterior shoulder girdle. Pt having significant pain in the LUE as well this session, slightly limiting her tolerance to exercises that required use of BUE. OT added loop band  exercises this session to continue progressing her strength and stability. Verbal and tactile cuing provided for positioning and technique.   PERFORMANCE DEFICITS: in functional skills including in functional skills including ADLs, IADLs, coordination, tone, ROM, strength, pain, fascial restrictions, muscle spasms, and UE functional use.    PLAN:  OT FREQUENCY: 2x/week  OT DURATION: 4 weeks  PLANNED INTERVENTIONS: self care/ADL training, therapeutic exercise, therapeutic activity, neuromuscular re-education, manual therapy, passive range of motion, splinting, electrical stimulation, ultrasound, moist heat, cryotherapy, patient/family education, and DME and/or AE instructions  RECOMMENDED OTHER SERVICES: N/A  CONSULTED AND AGREED WITH PLAN OF CARE: Patient  PLAN FOR NEXT SESSION: Manual Therapy, A/ROM, Scapular strengthening, Isometrics, Bicep loading   Trish Mage, OTR/L [facility] [phone] Kennyth Arnold, OT 02/01/2023, 2:51 PM

## 2023-02-01 NOTE — Patient Instructions (Signed)
Complete _______ repetitions each. Complete _______ time a day.  Wall taps with theraband  With a looped elastic band around forearms/wrists and arms at a 90 angle pressed against the wall. Keeping elbows on the wall, tap right arm out to the right. Hold for 1 second. Return right arm back to neutral. Repeat with left arm.    Wall V slides with Theraband  Place a band loop around hands/forearms and face a wall. Extend both arms diagonally into a V shape on the wall. Hold this stretch for specified amount of time. Lower arms slowly back into neutral position. Repeat.       scap clocks  Tie a loop with a theraband and place around your wrists.  Stand with a wall in front of you.  Picture a clock in front of you.  Place both palms on the wall, arms straight.  While keeping the left/right hand planted, use the right/left hand to pull away and tap each number (1, 3, 5- right OR 11,9,7 left), coming back to center each time.
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
## 2023-02-01 NOTE — Patient Instructions (Signed)
We didn't make any medication changes today. As we discussed, if you can start to use cash instead of credit cards, de-couple your credit card information from the websites you do your shopping, look for the warning signs of needing to spend that should begin to help the excess spending. Similarly, leave tags on items you purchase and make sure you can return them. You may also start to look into yoga for your sciatica.

## 2023-02-08 DIAGNOSIS — E559 Vitamin D deficiency, unspecified: Secondary | ICD-10-CM | POA: Diagnosis not present

## 2023-02-08 DIAGNOSIS — M1990 Unspecified osteoarthritis, unspecified site: Secondary | ICD-10-CM | POA: Diagnosis not present

## 2023-02-08 DIAGNOSIS — G8929 Other chronic pain: Secondary | ICD-10-CM | POA: Diagnosis not present

## 2023-02-08 DIAGNOSIS — Z1159 Encounter for screening for other viral diseases: Secondary | ICD-10-CM | POA: Diagnosis not present

## 2023-02-08 DIAGNOSIS — Z6834 Body mass index (BMI) 34.0-34.9, adult: Secondary | ICD-10-CM | POA: Diagnosis not present

## 2023-02-08 DIAGNOSIS — M545 Low back pain, unspecified: Secondary | ICD-10-CM | POA: Diagnosis not present

## 2023-02-08 DIAGNOSIS — Z79899 Other long term (current) drug therapy: Secondary | ICD-10-CM | POA: Diagnosis not present

## 2023-02-08 DIAGNOSIS — E7800 Pure hypercholesterolemia, unspecified: Secondary | ICD-10-CM | POA: Diagnosis not present

## 2023-02-08 DIAGNOSIS — G894 Chronic pain syndrome: Secondary | ICD-10-CM | POA: Diagnosis not present

## 2023-02-08 DIAGNOSIS — Z131 Encounter for screening for diabetes mellitus: Secondary | ICD-10-CM | POA: Diagnosis not present

## 2023-02-08 DIAGNOSIS — E6609 Other obesity due to excess calories: Secondary | ICD-10-CM | POA: Diagnosis not present

## 2023-02-08 DIAGNOSIS — R5383 Other fatigue: Secondary | ICD-10-CM | POA: Diagnosis not present

## 2023-02-09 ENCOUNTER — Encounter (HOSPITAL_COMMUNITY): Payer: Self-pay | Admitting: Occupational Therapy

## 2023-02-09 ENCOUNTER — Ambulatory Visit (HOSPITAL_COMMUNITY): Payer: BC Managed Care – PPO | Admitting: Occupational Therapy

## 2023-02-09 DIAGNOSIS — R29898 Other symptoms and signs involving the musculoskeletal system: Secondary | ICD-10-CM

## 2023-02-09 DIAGNOSIS — M25511 Pain in right shoulder: Secondary | ICD-10-CM | POA: Diagnosis not present

## 2023-02-09 DIAGNOSIS — G8929 Other chronic pain: Secondary | ICD-10-CM | POA: Diagnosis not present

## 2023-02-09 DIAGNOSIS — M25611 Stiffness of right shoulder, not elsewhere classified: Secondary | ICD-10-CM

## 2023-02-09 NOTE — Therapy (Signed)
OUTPATIENT OCCUPATIONAL THERAPY ORTHO TREATMENT NOTE   Patient Name: Rebecca Boyd MRN: [id number] DOB:July 01, 1975, 47 y.o., female Today's Date: 02/09/2023   END OF SESSION:    02/09/23 1351  OT Visits / Re-Eval  Visit Number 7  Number of Visits 9  Date for OT Re-Evaluation 02/23/23  Authorization  Authorization Type BCBS  OT Time Calculation  OT Start Time 1313  OT Stop Time 1351  OT Time Calculation (min) 38 min  End of Session  Activity Tolerance Patient tolerated treatment well  Behavior During Therapy WFL for tasks assessed/performed    Past Medical History:  Diagnosis Date   Anemia    Anxiety    Asthma    Bacterial vaginosis 07/27/2014   Caffeine overuse 10/04/2022   Caffeine-induced insomnia (HCC) 10/04/2022   Cervical neck pain with evidence of disc disease 10/02/2013   Depressive disorder, not elsewhere classified    Epistaxis 07/09/2019   Family history of breast cancer    Family history of breast cancer in sister 09/25/2018   Family history of colon cancer    Family history of lung cancer    FH: breast cancer in relative when <23 years old 06/10/2018   Dx at age 18   Headache 03/16/2016   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   Initial dx is 05/2013    Hypertension    Migraine    Migraines    Miscarriage 2009   Neck pain    Nondisplaced fracture of fifth metatarsal bone, left foot, subsequent encounter for fracture with routine healing 08/15/18 09/25/2018   Pruritus 09/29/2018   Pruritus ani 09/25/2018   Rash and nonspecific skin eruption 05/14/2017   Vaginitis    cyctitis   Past Surgical History:  Procedure Laterality Date   BIOPSY  07/07/2022   Procedure: BIOPSY;  Surgeon: Lanelle Bal, DO;  [facility];  Service: Endoscopy;;   cervical cryotherapy N/A 1999   COLONOSCOPY WITH PROPOFOL N/A 05/10/2021   Procedure: COLONOSCOPY WITH PROPOFOL;  Surgeon: Lanelle Bal, DO;  [facility];  Service: Endoscopy;  Laterality:  N/A;  11:30am   DILITATION & CURRETTAGE/HYSTROSCOPY WITH THERMACHOICE ABLATION  08/26/2013   Procedure: DILATATION & CURETTAGE/HYSTEROSCOPY WITH THERMACHOICE ENDOMETRIAL ABLATION Procedure #2 Total Therapy Time=min       sec;  Surgeon: Tilda Burrow, MD;  Location: [facility];  Service: Gynecology;;   ESOPHAGOGASTRODUODENOSCOPY (EGD) WITH PROPOFOL N/A 07/07/2022   Procedure: ESOPHAGOGASTRODUODENOSCOPY (EGD) WITH PROPOFOL;  Surgeon: Lanelle Bal, DO;  [facility];  Service: Endoscopy;  Laterality: N/A;  245pm, asa 2   LAPAROSCOPIC BILATERAL SALPINGECTOMY Bilateral 08/26/2013   Procedure: LAPAROSCOPIC BILATERAL SALPINGECTOMY AND REMOVAL OF LEFT PERITUBAL CYST Procedure #1;  Surgeon: Tilda Burrow, MD;  Location: [facility];  Service: Gynecology;  Laterality: Bilateral;   LAPAROSCOPIC LYSIS OF ADHESIONS  08/26/2013   Procedure: LAPAROSCOPIC LYSIS OF ADHESIONS Procedure #1;  Surgeon: Tilda Burrow, MD;  Location: [facility];  Service: Gynecology;;   POLYPECTOMY  05/10/2021   Procedure: POLYPECTOMY;  Surgeon: Lanelle Bal, DO;  [facility];  Service: Endoscopy;;   POLYPECTOMY  07/07/2022   Procedure: POLYPECTOMY;  Surgeon: Lanelle Bal, DO;  [facility];  Service: Endoscopy;;   VULVAR LESION REMOVAL  08/26/2013   Procedure: REMOVAL OF VULVAR SEBACEOUS CYST Procedure #3;  Surgeon: Tilda Burrow, MD;  Location: [facility];  Service: Gynecology;;   Patient Active Problem List   Diagnosis Date Noted   Bacterial vaginal  infection 11/22/2022   Moderate persistent asthma without complication 11/22/2022   Shift work sleep disorder 10/04/2022   OSA (obstructive sleep apnea) not on CPAP 10/04/2022   Hyperpigmentation 08/28/2022   Nodule of buttock 06/19/2022   Abnormal CXR 05/07/2022   Asthma exacerbation 04/27/2022   Syncope 04/27/2022   Moderate episode of recurrent major depressive disorder (HCC) 12/29/2021   GAD (generalized anxiety disorder) 12/29/2021   Left hip  pain 10/02/2021   Dyslipidemia 08/25/2021   Prediabetes 08/25/2021   Low vitamin D level 07/07/2021   Elevated lipase 03/31/2021   FH: colon cancer 02/18/2021   Numbness and tingling of left hand 06/08/2020   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   Asthma 10/29/2019   Hypokalemia 07/11/2019   Family history of lung cancer    Rash and nonspecific skin eruption 05/14/2017   Urinary frequency 02/19/2017   Migraine syndrome 12/06/2016   Annual physical exam 11/11/2015   Morbid obesity due to excess calories (HCC) 03/17/2015   Essential hypertension 01/07/2014   Flank pain 08/31/2013   Pain in thoracic spine 08/07/2013   Low back pain with radiation 07/30/2013   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   Vitamin D deficiency 10/27/2010   Allergic rhinitis 12/01/2008   Amenorrhea 02/12/2008   Neck pain 07/04/2007    PCP: Kerri Perches, MD REFERRING PROVIDER: Ty Hilts, MD  ONSET DATE: ~4 months  REFERRING DIAG: Chronic R shoulder Pain  THERAPY DIAG:  No diagnosis found.  Rationale for Evaluation and Treatment: Rehabilitation  SUBJECTIVE:   SUBJECTIVE STATEMENT: "I even couldn't lift my arm up after last session." Pt accompanied by: self  PERTINENT HISTORY: Pt has been having severe pain for 4 months with no clear start to her pain. MRI indicates quadrilateral space syndrome, along with non operative bicep tendinosis and iterstitial tears of the rotator cuff.   PRECAUTIONS: Shoulder  WEIGHT BEARING RESTRICTIONS: Yes 5lbs  PAIN:  Are you having pain? Yes: NPRS scale: 9/10 Pain location: anterior shoulder Pain description: aching Aggravating factors: movement Relieving factors: nothing  FALLS: Has patient fallen in last 6 months? No  PLOF: Independent  PATIENT GOALS: I want to try strengthening and eliminating as much pain as possible.  NEXT MD VISIT: 12/25/22  OBJECTIVE:   HAND DOMINANCE: Right  ADLs: Overall ADLs: Pt reports  difficulty with lifting her arm up for dressing and bathing, unable to do her hair. Additionally, on her bad days she is unable to cook or clean due to pain and weakness and requires help even on her good days.   FUNCTIONAL OUTCOME MEASURES: FOTO: 45.58 01/17/23: 64.47  UPPER EXTREMITY ROM:       Assessed in seated, er/IR adducted  Active ROM Right eval Right 01/17/23  Shoulder flexion 144 163  Shoulder abduction 165 169  Shoulder internal rotation 90 90  Shoulder external rotation -4 74  (Blank rows = not tested)    UPPER EXTREMITY MMT:     Assessed in seated, er/IR adducted  MMT Right eval Right 01/17/23  Shoulder flexion 4/5 4+/5  Shoulder abduction 4/5 4+/5  Shoulder internal rotation 4+/5 5/5  Shoulder external rotation 4/5 4+/5  (Blank rows = not tested)  SENSATION: WFL  EDEMA: No swelling noted  OBSERVATIONS: Moderate fascial restrictions along anterior shoulder girdle, biceps, deltoid, scapular region, and axillary region.    TODAY'S TREATMENT:  DATE:  02/09/23 -Manual Therapy: myofascial release and trigger point applied to the bicep, trapezius, scapular region, and pectoralis, in order to reduce pain, fascial restrictions, and improve ROM.  -AA/ROM: seated, flexion, abduction, protraction, horizontal abduction, er/IR, x15 -A/ROM: seated, flexion, abduction, protraction, horizontal abduction, er/IR, x8  02/01/23 -Manual Therapy: myofascial release and trigger point applied to the bicep, trapezius, scapular region, and pectoralis, in order to reduce pain, fascial restrictions, and improve ROM.  -A/ROM: seated, flexion, abduction, protraction, horizontal abduction, er/IR, x15 -Therapy Ball Exercises: flexion, protraction, overhead press, V ups, Circles both directions, x15 -Loop Band Exercises: wall taps, V ups, Wall Clocks,  x10 -Overhead lacing  01/17/23 -Manual Therapy: myofascial release and trigger point applied to the bicep, trapezius, scapular region, and pectoralis, in order to reduce pain, fascial restrictions, and improve ROM.  -A/ROM: seated, flexion, abduction, protraction, horizontal abduction, er/IR, x15 -Shoulder Strengthening: 2lb dumbbells, flexion, abduction, protraction, horizontal abduction, er/IR, x15 -PNF Strengthening: green band, chest pulls, overhead pulls, er pulls, PNF up, PNF down, x10 -Measurements for reassessment   PATIENT EDUCATION: Education details: Reduce back down to AA/ROM and A/ROM Person educated: Patient Education method: Programmer, multimedia, Facilities manager, and Handouts Education comprehension: verbalized understanding and returned demonstration  HOME EXERCISE PROGRAM: 8/16: A/ROM 9/3: Isometrics 9/10: Therapy Ball Exercises 9/16: Scapular Strengthening 9/18: PNF Strengthening 10/3: Loop Band Exercises  GOALS: Goals reviewed with patient? Yes   SHORT TERM GOALS: Target date: 01/19/23  Pt will be provided with and educated on HEP to improve mobility in RUE required for use during ADL completion.   Goal status: IN PROGRESS  Pt will decrease pain in RUE to 3/10 or less to improve ability to sleep for 2+ consecutive hours without waking due to pain.   Goal status: IN PROGRESS  2.  Pt will decrease RUE fascial restrictions to min amounts or less to improve mobility required for functional reaching tasks.   Goal status: IN PROGRESS  3.  Pt will increase RUE A/ROM by 10 degrees to improve ability to use RUE when reaching overhead or behind back during dressing and bathing tasks.   Goal status: IN PROGRESS  4.  Pt will increase RUE strength to 5/5 or greater to improve ability to use RUE when lifting or carrying items during meal preparation/housework/yardwork tasks.   Goal status: IN PROGRESS  5.  Pt will return to highest level of function using RUE as dominant  during functional task completion.   Goal status: IN PROGRESS   ASSESSMENT:  CLINICAL IMPRESSION: This session, pt having significant pain, limiting all mobility. OT providing manual therapy due to severe fascial restrictions along the anterior shoulder girdle and biceps. Additionally, pt presenting with mild to moderate swelling in the axillary region to pectoralis region of RUE. OT and pt taking exercises a step back this session to focus on safe mobility. Verbal and tactile cuing provided for positioning and technique throughout session.   PERFORMANCE DEFICITS: in functional skills including in functional skills including ADLs, IADLs, coordination, tone, ROM, strength, pain, fascial restrictions, muscle spasms, and UE functional use.    PLAN:  OT FREQUENCY: 2x/week  OT DURATION: 4 weeks  PLANNED INTERVENTIONS: self care/ADL training, therapeutic exercise, therapeutic activity, neuromuscular re-education, manual therapy, passive range of motion, splinting, electrical stimulation, ultrasound, moist heat, cryotherapy, patient/family education, and DME and/or AE instructions  RECOMMENDED OTHER SERVICES: N/A  CONSULTED AND AGREED WITH PLAN OF CARE: Patient  PLAN FOR NEXT SESSION: Manual Therapy, A/ROM, Scapular strengthening, Isometrics, Bicep loading   Shatisha Falter  Bing Plume OTR/L [facility] [phone] Brittne Kawasaki Rosemarie Beath, OT 02/09/2023, 1:29 PM

## 2023-02-13 ENCOUNTER — Ambulatory Visit: Payer: BC Managed Care – PPO | Admitting: Gastroenterology

## 2023-02-13 ENCOUNTER — Encounter: Payer: Self-pay | Admitting: Gastroenterology

## 2023-02-13 NOTE — Progress Notes (Deleted)
GI Office Note    Referring Provider: Kerri Perches, MD Primary Care Physician:  Kerri Perches, MD Primary Gastroenterologist: Hennie Duos. Marletta Lor, DO   Date:  02/13/2023  ID:  Rebecca Boyd, DOB 01-Jan-1976, MRN [id number]   Chief Complaint   No chief complaint on file.  History of Present Illness  Rebecca Boyd is a 47 y.o. female with a history of anxiety, anemia, depression, HTN, and migraines presenting today with complaint of ***  Office visit 02/18/21. Visit to discuss first-ever screening colonoscopy.  Reported maternal cousin with colon cancer age 57, maternal aunt with stage IV colon cancer diagnosed age 23.  Also noted to have a sister with breast cancer diagnosed in her 30s.  Doing well from a GI standpoint denies any bowel concerns.  No melena or BRBPR.  No abdominal pain, weight loss, vomiting, constipation, diarrhea, or heartburn.  Scheduled for colonoscopy.   Colonoscopy 05/10/21: -Non-bleeding internal hemorrhoids -4 polyps of the rectosigmoid colon (hyperplastic) -Repeat colonoscopy in 10 years   Office visit with PCP 06/15/2022.  Patient reported 2-week history of epigastric pain 7/10 and RUQ and RLQ pain that has occasionally awaken patient.  PCP ordered CT A/P as well as H. pylori blood test.  Advised on healthy eating plan with goal to lose weight.  Urine pregnancy test negative.   CT A/P 06/26/2022: -No acute abnormality to explain abdominal pain -Small fat-containing umbilical hernia -Suspected uterine fibroids -Small to moderate volume of stool in the colon without inflammation -Occasional diverticula without diverticulitis -Normal pancreas, liver, and gallbladder   Labs in December 2023 with normal hemoglobin at 12.4.  Last office visit 07/04/22: ***Reported mid upper abdominal pain for about a month radiating to her chest at times with globus sensation.  This is worse when she eats and has had some bloating.  No specific identifiable food  triggers.  No weight loss.  No NSAIDs and some baseline shortness of breath with asthma.  Denied any constipation or diarrhea.  Has had some random black stools.  Scheduled for upper endoscopy for further evaluation of melena and epigastric pain.  Advised to start pantoprazole 40 mg once daily.  GERD diet discussed.  EGD 07/07/22: -GE junction polyps found -Gastritis s/p biopsy -Normal duodenum -Nonsevere reflux esophagitis without bleeding -Stomach biopsy negative for H. pylori, benign mucosa. -Esophageal polypectomy with Polly pills squamocolumnar mucosa with reactive changes consistent with reflux disease -Use pantoprazole 40 mg twice daily for 12 weeks  Today:   Current Outpatient Medications  Medication Sig Dispense Refill   albuterol (VENTOLIN HFA) 108 (90 Base) MCG/ACT inhaler Inhale 1-2 puffs into the lungs every 4 (four) hours as needed for wheezing or shortness of breath. 18 g 3   amLODipine (NORVASC) 10 MG tablet Take 1 tablet (10 mg total) by mouth daily. 90 tablet 2   beclomethasone (QVAR REDIHALER) 80 MCG/ACT inhaler Inhale 2 puffs into the lungs 2 (two) times daily. 1 each 5   betamethasone dipropionate 0.05 % cream Apply topically 2 (two) times daily. 45 g 0   diclofenac (VOLTAREN) 75 MG EC tablet Take 75 mg by mouth 2 (two) times daily.     EPINEPHrine 0.3 mg/0.3 mL IJ SOAJ injection Inject 0.3 mg into the muscle as needed for anaphylaxis. 1 each 1   Erenumab-aooe (AIMOVIG) 70 MG/ML SOAJ Inject 70 mg into the skin every 30 (thirty) days. (Patient taking differently: Inject 70 mg into the skin See admin instructions. Inject once month as needed for migraine  reoccurrence.) 1 pen 11   fluticasone (FLONASE) 50 MCG/ACT nasal spray Place 2 sprays into both nostrils daily. 16 g 6   gabapentin (NEURONTIN) 300 MG capsule Take 1 capsule (300 mg total) by mouth at bedtime. 30 capsule 1   methocarbamol (ROBAXIN) 750 MG tablet Take 1 tablet (750 mg total) by mouth 4 (four) times daily. 60  tablet 2   metroNIDAZOLE (METROGEL VAGINAL) 0.75 % vaginal gel Place 1 Applicatorful vaginally 2 (two) times daily. 70 g 2   montelukast (SINGULAIR) 10 MG tablet Take 1 tablet (10 mg total) by mouth at bedtime. 90 tablet 3   oxyCODONE-acetaminophen (PERCOCET/ROXICET) 5-325 MG tablet Take 1 tablet by mouth 2 (two) times daily.     SUMAtriptan (IMITREX) 6 MG/0.5ML SOLN injection Use one injection at onset of migraine.  May repeat in 2 hrs, if needed.  Max dose: 2 inj/day. This is a 30 day prescription. 5 mL 11   topiramate (TOPAMAX) 100 MG tablet Take 1 tablet (100 mg total) by mouth 2 (two) times daily. (Patient taking differently: Take 100 mg by mouth in the morning.) 180 tablet 4   vortioxetine HBr (TRINTELLIX) 5 MG TABS tablet Take 1 tablet (5 mg total) by mouth daily. 30 tablet 1   No current facility-administered medications for this visit.    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   Initial dx is 05/2013    Hypertension    Migraine    Migraines    Miscarriage 2009   Neck pain    Nondisplaced fracture of fifth metatarsal bone, left foot, subsequent encounter for fracture with routine healing 08/15/18 09/25/2018   Pruritus 09/29/2018   Pruritus ani 09/25/2018   Rash and nonspecific skin eruption 05/14/2017   Vaginitis    cyctitis    Past Surgical History:  Procedure Laterality Date   BIOPSY  07/07/2022   Procedure:  BIOPSY;  Surgeon: Lanelle Bal, DO;  [facility];  Service: Endoscopy;;   cervical cryotherapy N/A 1999   COLONOSCOPY WITH PROPOFOL N/A 05/10/2021   Procedure: COLONOSCOPY WITH PROPOFOL;  Surgeon: Lanelle Bal, DO;  [facility];  Service: Endoscopy;  Laterality: N/A;  11:30am   DILITATION & CURRETTAGE/HYSTROSCOPY WITH THERMACHOICE ABLATION  08/26/2013   Procedure: DILATATION & CURETTAGE/HYSTEROSCOPY WITH THERMACHOICE ENDOMETRIAL ABLATION Procedure #2 Total Therapy Time=min       sec;  Surgeon: Tilda Burrow, MD;  [facility]: [facility];  Service: Gynecology;;   ESOPHAGOGASTRODUODENOSCOPY (EGD) WITH PROPOFOL N/A 07/07/2022   Procedure: ESOPHAGOGASTRODUODENOSCOPY (EGD) WITH PROPOFOL;  Surgeon: Lanelle Bal, DO;  [facility];  Service: Endoscopy;  Laterality: N/A;  245pm, asa 2   LAPAROSCOPIC BILATERAL SALPINGECTOMY Bilateral 08/26/2013   Procedure: LAPAROSCOPIC BILATERAL SALPINGECTOMY AND REMOVAL OF LEFT PERITUBAL CYST Procedure #1;  Surgeon: Tilda Burrow, MD;  [facility]: [facility];  Service: Gynecology;  Laterality: Bilateral;   LAPAROSCOPIC LYSIS OF ADHESIONS  08/26/2013   Procedure: LAPAROSCOPIC LYSIS OF ADHESIONS Procedure #1;  Surgeon: Tilda Burrow, MD;  [facility]: [facility];  Service: Gynecology;;   POLYPECTOMY  05/10/2021   Procedure: POLYPECTOMY;  Surgeon: Lanelle Bal, DO;  [facility];  Service: Endoscopy;;   POLYPECTOMY  07/07/2022   Procedure: POLYPECTOMY;  Surgeon: Lanelle Bal, DO;  [facility];  Service: Endoscopy;;   VULVAR LESION REMOVAL  08/26/2013   Procedure: REMOVAL OF VULVAR SEBACEOUS CYST Procedure #3;  Surgeon: Tilda Burrow, MD;  [facility];  Service: Gynecology;;    Family History  Problem Relation Age of Onset   Depression Mother    Drug abuse Mother    COPD Mother    Hypertension Father    Breast cancer Sister        dx 64   Colon cancer Maternal Aunt        62   Hypertension Maternal  Grandmother    Hypertension Maternal Grandfather    Lung cancer Maternal Grandfather    Hypertension Paternal Grandmother    Breast cancer Paternal Grandmother        dx late 54s   Colon cancer Cousin 2       maternal    Allergies as of 02/13/2023 - Review Complete 02/09/2023  Allergen Reaction Noted   Diphenhydramine hcl Anaphylaxis 07/04/2007   Orange fruit Hives, Shortness Of Breath, Itching, and Other (See Comments) 11/24/2010   Latex Rash 11/03/2013    Social History   Socioeconomic History   Marital status: Single    Spouse name: Not on file   Number of children: Not on file   Years of education: Not on file   Highest education level: GED or equivalent  Occupational History   Not on file  Tobacco Use   Smoking status: Former    Current packs/day: 0.00    Types: Cigars, Cigarettes    Quit date: 01/06/2016    Years since quitting: 7.1   Smokeless tobacco: Never  Vaping Use   Vaping status: Never Used  Substance and Sexual Activity   Alcohol use: Yes    Comment: 1 beer at a time and frequently at social gatherings   Drug use: Not Currently    Comment: See psychiatry note from 10/04/2022   Sexual activity: Yes    Birth control/protection: Condom  Other Topics Concern   Not on file  Social History Narrative   Lives home with mother.  Works at [employer].  Education 10th grade/GED.  No children.  Single.     Social Determinants of Health   Financial Resource Strain: Medium Risk (07/31/2022)   Overall Financial Resource Strain (CARDIA)    Difficulty of Paying Living Expenses: Somewhat hard  Food Insecurity: Food Insecurity Present (07/31/2022)   Hunger Vital Sign    Worried About Running Out of Food in the Last Year: Sometimes true    Ran Out of Food in the Last Year: Often true  Transportation Needs: No Transportation Needs (07/31/2022)   PRAPARE - Administrator, Civil Service (Medical): No    Lack of Transportation (Non-Medical): No  Physical  Activity: Insufficiently Active (07/31/2022)   Exercise Vital Sign    Days of Exercise per Week: 1 day    Minutes of Exercise per Session: 10 min  Stress: Stress Concern Present (07/31/2022)   [employer] of Occupational Health - Occupational Stress Questionnaire  Feeling of Stress : To some extent  Social Connections: Moderately Isolated (07/31/2022)   Social Connection and Isolation Panel [NHANES]    Frequency of Communication with Friends and Family: Three times a week    Frequency of Social Gatherings with Friends and Family: Once a week    Attends Religious Services: 1 to 4 times per year    Active Member of [employer] or Organizations: No    Attends Engineer, structural: Not on file    Marital Status: Never married   Review of Systems   Gen: Denies fever, chills, anorexia. Denies fatigue, weakness, weight loss.  CV: Denies chest pain, palpitations, syncope, peripheral edema, and claudication. Resp: Denies dyspnea at rest, cough, wheezing, coughing up blood, and pleurisy. GI: See HPI Derm: Denies rash, itching, dry skin Psych: Denies depression, anxiety, memory loss, confusion. No homicidal or suicidal ideation.  Heme: Denies bruising, bleeding, and enlarged lymph nodes.  Physical Exam   There were no vitals taken for this visit.  General:   Alert and oriented. No distress noted. Pleasant and cooperative.  Head:  Normocephalic and atraumatic. Eyes:  Conjuctiva clear without scleral icterus. Mouth:  Oral mucosa pink and moist. Good dentition. No lesions. Lungs:  Clear to auscultation bilaterally. No wheezes, rales, or rhonchi. No distress.  Heart:  S1, S2 present without murmurs appreciated.  Abdomen:  +BS, soft, non-tender and non-distended. No rebound or guarding. No HSM or masses noted. Rectal: *** Msk:  Symmetrical without gross deformities. Normal posture. Extremities:  Without edema. Neurologic:  Alert and  oriented x4 Psych:  Alert and cooperative. Normal  mood and affect.   Assessment  Rebecca Boyd is a 47 y.o. female with a history of *** presenting today with   GERD, epigastric pain, esophageal polyps:    PLAN   ***     Brooke Bonito, MSN, FNP-BC, AGACNP-BC [facility]

## 2023-02-14 ENCOUNTER — Encounter (HOSPITAL_COMMUNITY): Payer: BC Managed Care – PPO | Admitting: Occupational Therapy

## 2023-02-15 ENCOUNTER — Ambulatory Visit (INDEPENDENT_AMBULATORY_CARE_PROVIDER_SITE_OTHER): Payer: BC Managed Care – PPO | Admitting: Orthopedic Surgery

## 2023-02-15 ENCOUNTER — Encounter: Payer: Self-pay | Admitting: Orthopedic Surgery

## 2023-02-15 ENCOUNTER — Other Ambulatory Visit: Payer: Self-pay | Admitting: Orthopedic Surgery

## 2023-02-15 VITALS — BP 113/75 | HR 83 | Ht 60.0 in | Wt 183.0 lb

## 2023-02-15 DIAGNOSIS — M5116 Intervertebral disc disorders with radiculopathy, lumbar region: Secondary | ICD-10-CM

## 2023-02-15 DIAGNOSIS — G8929 Other chronic pain: Secondary | ICD-10-CM | POA: Diagnosis not present

## 2023-02-15 DIAGNOSIS — M47816 Spondylosis without myelopathy or radiculopathy, lumbar region: Secondary | ICD-10-CM | POA: Diagnosis not present

## 2023-02-15 DIAGNOSIS — M25511 Pain in right shoulder: Secondary | ICD-10-CM | POA: Diagnosis not present

## 2023-02-15 MED ORDER — METHYLPREDNISOLONE ACETATE 40 MG/ML IJ SUSP
40.0000 mg | Freq: Once | INTRAMUSCULAR | Status: AC
Start: 2023-02-15 — End: 2023-02-15
  Administered 2023-02-15: 40 mg via INTRA_ARTICULAR

## 2023-02-15 NOTE — Patient Instructions (Signed)
Call next week to schedule the injection at [facility] [phone]

## 2023-02-15 NOTE — Progress Notes (Signed)
FOLLOW UP VISIT   Chief Complaint  Patient presents with   Shoulder Pain    R/ some days it is hurting and can't move it like I should, but today it is hurting, but not real bad.    Encounter Diagnosis  Name Primary?   Chronic right shoulder pain/quadrilateral space syndrome Yes   Related medications Gabapentin 300 mg at bedtime 75 mg diclofenac twice a day   Rebecca Boyd is still having intermittent shoulder pain but does not want to have surgery  Her MRI details are below  She opted for injection versus getting a referral to a quadrilateral space syndrome expert [place]   Procedure note the subacromial injection shoulder RIGHT    Verbal consent was obtained to inject the  RIGHT   Shoulder  Timeout was completed to confirm the injection site is a subacromial space of the  RIGHT  shoulder   Medication used Depo-Medrol 40 mg and lidocaine 1% 3 cc  Anesthesia was provided by ethyl chloride  The injection was performed in the RIGHT  posterior subacromial space. After pinning the skin with alcohol and anesthetized the skin with ethyl chloride the subacromial space was injected using a 20-gauge needle. There were no complications  Sterile dressing was applied.  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 Dutch Quint    MRI SEE DETAILS  IMPRESSION: 1. Two thin linear interstitial tears within the mid to anterior supraspinatus tendon footprint. No tendon retraction.  2. Mild-to-moderate atrophy of the teres minor with mild diffuse teres minor muscle edema. This can be seen with acute on chronic quadrilateral space syndrome. No space-occupying lesion is seen within the quadrilateral space on this exam.  3. Mild proximal horizontal long head of the biceps tendinosis.  4. Moderate degenerative changes of the acromioclavicular joint. This includes high-grade clavicular head greater than adjacent acromion  subchondral marrow edema suggesting this may represent a source of pain.  5. Mild subacromial/subdeltoid bursitis.

## 2023-02-19 ENCOUNTER — Other Ambulatory Visit: Payer: Self-pay

## 2023-02-19 ENCOUNTER — Ambulatory Visit (HOSPITAL_COMMUNITY)
Admission: RE | Admit: 2023-02-19 | Discharge: 2023-02-19 | Disposition: A | Discharge status: Home / Self Care | Payer: BC Managed Care – PPO | Source: Ambulatory Visit | Attending: Family Medicine | Admitting: Family Medicine

## 2023-02-19 ENCOUNTER — Encounter: Payer: Self-pay | Admitting: Family Medicine

## 2023-02-19 DIAGNOSIS — Z1231 Encounter for screening mammogram for malignant neoplasm of breast: Secondary | ICD-10-CM | POA: Insufficient documentation

## 2023-02-19 MED ORDER — BETAMETHASONE DIPROPIONATE 0.05 % EX CREA: TOPICAL_CREAM | Freq: Two times a day (BID) | CUTANEOUS | 0 refills | Status: DC

## 2023-02-19 MED ORDER — AMLODIPINE BESYLATE 10 MG PO TABS: 10.0000 mg | ORAL_TABLET | Freq: Every day | ORAL | 2 refills | Status: DC

## 2023-02-20 ENCOUNTER — Other Ambulatory Visit (HOSPITAL_COMMUNITY): Payer: Self-pay | Admitting: Psychiatry

## 2023-02-20 DIAGNOSIS — F331 Major depressive disorder, recurrent, moderate: Secondary | ICD-10-CM

## 2023-02-20 DIAGNOSIS — F411 Generalized anxiety disorder: Secondary | ICD-10-CM

## 2023-02-21 ENCOUNTER — Ambulatory Visit: Payer: BC Managed Care – PPO | Admitting: Primary Care

## 2023-02-21 NOTE — Discharge Instructions (Signed)

## 2023-02-22 ENCOUNTER — Ambulatory Visit
Admission: RE | Admit: 2023-02-22 | Discharge: 2023-02-22 | Disposition: A | Discharge status: Home / Self Care | Payer: BC Managed Care – PPO | Source: Ambulatory Visit | Attending: Orthopedic Surgery | Admitting: Orthopedic Surgery

## 2023-02-22 DIAGNOSIS — M4727 Other spondylosis with radiculopathy, lumbosacral region: Secondary | ICD-10-CM | POA: Diagnosis not present

## 2023-02-22 DIAGNOSIS — M5116 Intervertebral disc disorders with radiculopathy, lumbar region: Secondary | ICD-10-CM

## 2023-02-22 MED ORDER — METHYLPREDNISOLONE ACETATE 40 MG/ML INJ SUSP (RADIOLOG
80.0000 mg | Freq: Once | INTRAMUSCULAR | Status: AC
  Administered 2023-02-22: 80 mg via EPIDURAL

## 2023-02-22 MED ORDER — IOPAMIDOL (ISOVUE-M 200) INJECTION 41%
1.0000 mL | Freq: Once | INTRAMUSCULAR | Status: AC
  Administered 2023-02-22: 1 mL via EPIDURAL

## 2023-02-27 ENCOUNTER — Encounter (HOSPITAL_COMMUNITY): Payer: BC Managed Care – PPO | Admitting: Occupational Therapy

## 2023-03-01 ENCOUNTER — Telehealth (HOSPITAL_COMMUNITY): Payer: BC Managed Care – PPO | Admitting: Psychiatry

## 2023-03-02 ENCOUNTER — Telehealth (HOSPITAL_COMMUNITY): Payer: Self-pay | Admitting: Psychiatry

## 2023-03-02 ENCOUNTER — Telehealth (HOSPITAL_COMMUNITY): Payer: BC Managed Care – PPO | Admitting: Psychiatry

## 2023-03-02 DIAGNOSIS — F331 Major depressive disorder, recurrent, moderate: Secondary | ICD-10-CM

## 2023-03-02 DIAGNOSIS — F411 Generalized anxiety disorder: Secondary | ICD-10-CM

## 2023-03-02 MED ORDER — VORTIOXETINE HBR 5 MG PO TABS: 5.0000 mg | ORAL_TABLET | Freq: Every day | ORAL | 0 refills | Status: DC

## 2023-03-02 NOTE — Telephone Encounter (Signed)
Patient no show to appointment today. Link sent to number provided but never arrived in virtual space. Unfortunately, this is patient's third no show which exceeds no show limit per clinic policy. Dismissal letter sent with 30 day supply of medication.

## 2023-03-12 ENCOUNTER — Ambulatory Visit: Payer: BC Managed Care – PPO | Admitting: Primary Care

## 2023-03-13 ENCOUNTER — Encounter: Payer: Self-pay | Admitting: Primary Care

## 2023-03-13 DIAGNOSIS — M461 Sacroiliitis, not elsewhere classified: Secondary | ICD-10-CM | POA: Diagnosis not present

## 2023-03-13 DIAGNOSIS — M533 Sacrococcygeal disorders, not elsewhere classified: Secondary | ICD-10-CM | POA: Diagnosis not present

## 2023-03-19 DIAGNOSIS — Z79899 Other long term (current) drug therapy: Secondary | ICD-10-CM | POA: Diagnosis not present

## 2023-03-19 DIAGNOSIS — Z6835 Body mass index (BMI) 35.0-35.9, adult: Secondary | ICD-10-CM | POA: Diagnosis not present

## 2023-03-19 DIAGNOSIS — M1990 Unspecified osteoarthritis, unspecified site: Secondary | ICD-10-CM | POA: Diagnosis not present

## 2023-03-19 DIAGNOSIS — E785 Hyperlipidemia, unspecified: Secondary | ICD-10-CM | POA: Diagnosis not present

## 2023-03-19 DIAGNOSIS — E559 Vitamin D deficiency, unspecified: Secondary | ICD-10-CM | POA: Diagnosis not present

## 2023-03-19 DIAGNOSIS — E6609 Other obesity due to excess calories: Secondary | ICD-10-CM | POA: Diagnosis not present

## 2023-03-19 DIAGNOSIS — M545 Low back pain, unspecified: Secondary | ICD-10-CM | POA: Diagnosis not present

## 2023-03-19 DIAGNOSIS — G8929 Other chronic pain: Secondary | ICD-10-CM | POA: Diagnosis not present

## 2023-03-20 ENCOUNTER — Inpatient Hospital Stay (HOSPITAL_COMMUNITY)
Admission: RE | Admit: 2023-03-20 | Discharge: 2023-03-20 | Disposition: A | Discharge status: Home / Self Care | Payer: BC Managed Care – PPO | Source: Ambulatory Visit

## 2023-03-28 ENCOUNTER — Ambulatory Visit (HOSPITAL_COMMUNITY): Payer: BC Managed Care – PPO | Admitting: Psychiatry

## 2023-03-28 DIAGNOSIS — F411 Generalized anxiety disorder: Secondary | ICD-10-CM

## 2023-03-28 NOTE — Progress Notes (Signed)
Virtual Visit via Video Note  I connected with Rebecca Boyd on 03/28/23 at 1:13 PM EST by a video enabled telemedicine application and verified that I am speaking with the correct person using two identifiers.  Location: Patient: Home Provider: [facility] [place] office   I discussed the limitations of evaluation and management by telemedicine and the availability of in person appointments. The patient expressed understanding and agreed to proceed.   I provided 38 minutes of non-face-to-face time during this encounter.   Adah Salvage, LCSW    IN-PERSON  THERAPIST PROGRESS NOTE  Session Time:  Wednesday 03/28/2023 1:13 PM - 1:51 PM   Participation Level: Active  Behavioral Response: AlertAnxious  Type of Therapy: Individual Therapy  Treatment Goals addressed: Rebecca Boyd will score less than 5 on the Generalized Anxiety Disorder 7 Scale (GAD-7  Pt will learn 3 relaxation techniques and practice a relaxation technique daily    ProgressTowards Goals: Not progressing   Interventions: CBT and Supportive  Summary: Rebecca Boyd is a 47 y.o. female who presents with  Pt is referred for services by PCP Dr. Lodema Hong due to pt experiencing symptoms of anxiety and depression. She is a returning pt to this clinician and last was seen in 2022, She also saw psychiatrist Dr. Vanetta Shawl  in the past. She denies any psychiatric hospitalizations.  Patient reports crying a lot, spending money she does not have becoming stressed out about how to pay her bills.  She also reports grief and loss issues regarding her aunt dying in July 2023.  Per patient's report this and use to help her and was her backbone.  She also reports grief and loss issues regarding her boyfriend dying of cancer in November 2022.  Symptoms include crying spells, anxiety, excessive worry, mood swings, irritability, restlessness, and overconfidence   Patient last was seen about 2 months ago.  She continues to  experience symptoms of anxiety as reflected in the GAD-7.  She also reports continued excessive spending. Per pt's report, she is no longer using urge surfing to try to manage and her spending habits have worsened causing anxiety regarding paying her bills. She also continues to experience grief and loss issues regarding the death of her boyfriend and her aunt. She reports becoming tearful or having crying spells about 2-3 x per week. She also reports continued tendency to try to avoid thinking about them but says it doesn't work. Pt reports medication compliance.   Suicidal/Homicidal: Nowithout intent/plan  Therapist Response: Reviewed symptoms, administered GAD-7, discussed results, praised and reinforced patient's medication compliance, discussed patient pursuing medication management as she has been dismissed from seeing Dr. Gust Rung due to to no-show policy, discussed stressors, facilitated expression of thoughts and feelings, validated feelings, discussed patient's concerns to target and prioritize for treatment, agreed to address grief and loss issues, began to assist patient identify how avoidance of dealing with grief has affected her current functioning, normalized increased grief and loss issues triggered by the holidays, facilitated patient beginning to express thoughts and feelings regarding loss, assisted patient identify ways to cope with upcoming Thanksgiving holiday,    plan: Return again in 2 weeks.  Diagnosis: GAD (generalized anxiety disorder)  Collaboration of Care: PCP as patient is encouraged to contact Dr. Lodema Hong regarding medication management as well as review letter sent from this practice regarding possible providers  Patient/Guardian was advised Release of Information must be obtained prior to any record release in order to collaborate their care with an outside provider. Patient/Guardian  was advised if they have not already done so to contact the registration department to  sign all necessary forms in order for [place] to release information regarding their care.   Consent: Patient/Guardian gives verbal consent for treatment and assignment of benefits for services provided during this visit. Patient/Guardian expressed understanding and agreed to proceed.   Adah Salvage, LCSW 03/28/2023

## 2023-04-02 ENCOUNTER — Ambulatory Visit (INDEPENDENT_AMBULATORY_CARE_PROVIDER_SITE_OTHER): Payer: BC Managed Care – PPO | Admitting: Orthopedic Surgery

## 2023-04-02 DIAGNOSIS — M7541 Impingement syndrome of right shoulder: Secondary | ICD-10-CM

## 2023-04-02 NOTE — Progress Notes (Signed)
Chief Complaint  Patient presents with   Shoulder Pain    Shoulder is still in pain  would like an injection today     Encounter Diagnosis  Name Primary?   Rotator cuff impingement syndrome of right shoulder Yes    47 year old female with continued right shoulder pain.  She did have an MRI please see the MRI for details but basically there was" consideration of quadrilateral space syndrome.  But most of her pain is in the front of the shoulder  She considered repeating the injection today but I told her it was unwise as the first injection only worked for a week or so  I think her primary problem is biceps pathology she has some interstitial tearing of her rotator cuff some bursitis  I think she needs to have surgical intervention at this point as she has not improved with nonoperative measures  She would like to do this in April  If we are lucky her symptoms may have resolved by then.  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 Mild intermediate T2 signal tendinosis of the posterior supraspinatus and anterior infraspinatus interdigitating tendon fibers. The subscapularis and teres minor are intact.   Muscles: There is mild-to-moderate atrophy of the teres minor with mild diffuse teres minor muscle edema. No space-occupying lesion is seen within the quadrilateral space on this exam.   Biceps long head: Mild intermediate T2 signal and thickening of the proximal horizontal long head of the biceps tendon.   Acromioclavicular Joint: There are moderate  degenerative changes of the acromioclavicular joint including joint space narrowing, high-grade clavicular head greater than adjacent acromion subchondral marrow edema, and peripheral osteophytosis. Type II acromion. There superimposed mild distal lateral subacromial spurs. Mild fluid within the subacromial/subdeltoid bursa.   Glenohumeral Joint: Moderate glenohumeral cartilage thinning.   Labrum: Lack of intra-articular fluid limits evaluation of the glenoid labrum. There is linear fluid bright signal and air present a thin linear tear within the peripheral aspect of the posterosuperior labrum (axial series 103 images tendon 11). There is a 3 x 8 x 6 mm (transverse by craniocaudal) fluid bright focus bordering the anterior superior glenoid, deep to the labrum. This is nonspecific and may represent a tiny paralabral cyst or ganglion (sagittal image 12, axial image 10, and coronal image 14).   Bones:  No acute fracture.   Other: None.   IMPRESSION: 1. Two thin linear interstitial tears within the mid to anterior supraspinatus tendon footprint. No tendon retraction. 2. Mild-to-moderate atrophy of the teres minor with mild diffuse teres minor muscle edema. This can be seen with acute on chronic quadrilateral space syndrome. No space-occupying lesion is seen within the quadrilateral space on this exam. 3. Mild proximal horizontal long head of the biceps tendinosis. 4. Moderate degenerative changes of the acromioclavicular joint. This includes high-grade clavicular head greater than adjacent acromion subchondral marrow edema suggesting this may represent a source of pain. 5. Mild subacromial/subdeltoid bursitis.     Electronically Signed   By: Neita Garnet M.D.   On: 10/24/2022 20:09

## 2023-04-05 ENCOUNTER — Emergency Department (HOSPITAL_COMMUNITY): Payer: BC Managed Care – PPO

## 2023-04-05 ENCOUNTER — Encounter (HOSPITAL_COMMUNITY): Payer: Self-pay

## 2023-04-05 ENCOUNTER — Emergency Department (HOSPITAL_COMMUNITY)
Admission: EM | Admit: 2023-04-05 | Discharge: 2023-04-05 | Disposition: A | Discharge status: Home / Self Care | Payer: BC Managed Care – PPO | Attending: Emergency Medicine | Admitting: Emergency Medicine

## 2023-04-05 ENCOUNTER — Other Ambulatory Visit: Payer: Self-pay

## 2023-04-05 DIAGNOSIS — R1084 Generalized abdominal pain: Secondary | ICD-10-CM

## 2023-04-05 DIAGNOSIS — N281 Cyst of kidney, acquired: Secondary | ICD-10-CM | POA: Diagnosis not present

## 2023-04-05 DIAGNOSIS — Z91040 Latex allergy status: Secondary | ICD-10-CM | POA: Diagnosis not present

## 2023-04-05 DIAGNOSIS — E876 Hypokalemia: Secondary | ICD-10-CM

## 2023-04-05 DIAGNOSIS — K5730 Diverticulosis of large intestine without perforation or abscess without bleeding: Secondary | ICD-10-CM | POA: Diagnosis not present

## 2023-04-05 DIAGNOSIS — R0600 Dyspnea, unspecified: Secondary | ICD-10-CM | POA: Diagnosis not present

## 2023-04-05 DIAGNOSIS — J45909 Unspecified asthma, uncomplicated: Secondary | ICD-10-CM | POA: Diagnosis not present

## 2023-04-05 DIAGNOSIS — R109 Unspecified abdominal pain: Secondary | ICD-10-CM | POA: Diagnosis not present

## 2023-04-05 DIAGNOSIS — K429 Umbilical hernia without obstruction or gangrene: Secondary | ICD-10-CM | POA: Diagnosis not present

## 2023-04-05 DIAGNOSIS — Z7951 Long term (current) use of inhaled steroids: Secondary | ICD-10-CM | POA: Insufficient documentation

## 2023-04-05 LAB — COMPREHENSIVE METABOLIC PANEL
ALT: 22 U/L (ref 0–44)
AST: 15 U/L (ref 15–41)
Albumin: 4.1 g/dL (ref 3.5–5.0)
Alkaline Phosphatase: 69 U/L (ref 38–126)
Anion gap: 11 (ref 5–15)
BUN: 8 mg/dL (ref 6–20)
CO2: 24 mmol/L (ref 22–32)
Calcium: 9.3 mg/dL (ref 8.9–10.3)
Chloride: 103 mmol/L (ref 98–111)
Creatinine, Ser: 0.77 mg/dL (ref 0.44–1.00)
GFR, Estimated: >60 mL/min (ref >60)
Glucose, Bld: 94 mg/dL (ref 70–99)
Potassium: 3 mmol/L — ABNORMAL LOW (ref 3.5–5.1)
Sodium: 138 mmol/L (ref 135–145)
Total Bilirubin: 0.7 mg/dL (ref <1.2)
Total Protein: 7.5 g/dL (ref 6.5–8.1)

## 2023-04-05 LAB — URINALYSIS, ROUTINE W REFLEX MICROSCOPIC
Bacteria, UA: NONE SEEN
Bilirubin Urine: NEGATIVE
Glucose, UA: NEGATIVE mg/dL
Hgb urine dipstick: NEGATIVE
Ketones, ur: 20 mg/dL — AB
Leukocytes,Ua: NEGATIVE
Nitrite: NEGATIVE
Protein, ur: 30 mg/dL — AB
Specific Gravity, Urine: 1.019 (ref 1.005–1.030)
pH: 5 (ref 5.0–8.0)

## 2023-04-05 LAB — CBC
HCT: 43.6 % (ref 36.0–46.0)
Hemoglobin: 15.1 g/dL — ABNORMAL HIGH (ref 12.0–15.0)
MCH: 30.7 pg (ref 26.0–34.0)
MCHC: 34.6 g/dL (ref 30.0–36.0)
MCV: 88.6 fL (ref 80.0–100.0)
Platelets: 190 10*3/uL (ref 150–400)
RBC: 4.92 MIL/uL (ref 3.87–5.11)
RDW: 13.4 % (ref 11.5–15.5)
WBC: 6.8 10*3/uL (ref 4.0–10.5)
nRBC: 0 % (ref 0.0–0.2)

## 2023-04-05 LAB — TROPONIN I (HIGH SENSITIVITY): Troponin I (High Sensitivity): <2 ng/L (ref <18)

## 2023-04-05 LAB — LIPASE, BLOOD: Lipase: 26 U/L (ref 11–51)

## 2023-04-05 LAB — PREGNANCY, URINE: Preg Test, Ur: NEGATIVE

## 2023-04-05 MED ORDER — ONDANSETRON HCL 4 MG/2ML IJ SOLN
4.0000 mg | Freq: Once | INTRAMUSCULAR | Status: AC
  Administered 2023-04-05: 4 mg via INTRAVENOUS
  Filled 2023-04-05: qty 2

## 2023-04-05 MED ORDER — LACTATED RINGERS IV BOLUS
1000.0000 mL | Freq: Once | INTRAVENOUS | Status: AC
  Administered 2023-04-05: 1000 mL via INTRAVENOUS

## 2023-04-05 MED ORDER — ONDANSETRON 4 MG PO TBDP: 4.0000 mg | ORAL_TABLET | Freq: Three times a day (TID) | ORAL | 0 refills | Status: DC | PRN

## 2023-04-05 MED ORDER — DICYCLOMINE HCL 20 MG PO TABS: 20.0000 mg | ORAL_TABLET | Freq: Three times a day (TID) | ORAL | 0 refills | Status: DC

## 2023-04-05 MED ORDER — IOHEXOL 350 MG/ML SOLN
85.0000 mL | Freq: Once | INTRAVENOUS | Status: AC | PRN
  Administered 2023-04-05: 85 mL via INTRAVENOUS

## 2023-04-05 MED ORDER — HYDROMORPHONE HCL 1 MG/ML IJ SOLN
1.0000 mg | Freq: Once | INTRAMUSCULAR | Status: AC
  Administered 2023-04-05: 1 mg via INTRAVENOUS
  Filled 2023-04-05: qty 1

## 2023-04-05 MED ORDER — POTASSIUM CHLORIDE CRYS ER 20 MEQ PO TBCR: 20.0000 meq | EXTENDED_RELEASE_TABLET | Freq: Every day | ORAL | 0 refills | Status: DC

## 2023-04-05 MED ORDER — PANTOPRAZOLE SODIUM 40 MG PO TBEC: 40.0000 mg | DELAYED_RELEASE_TABLET | Freq: Every day | ORAL | 0 refills | Status: DC

## 2023-04-05 MED ORDER — POTASSIUM CHLORIDE CRYS ER 20 MEQ PO TBCR
40.0000 meq | EXTENDED_RELEASE_TABLET | Freq: Once | ORAL | Status: AC
  Administered 2023-04-05: 40 meq via ORAL
  Filled 2023-04-05: qty 2

## 2023-04-05 NOTE — ED Provider Notes (Signed)
[place] EMERGENCY DEPARTMENT AT [facility] Provider Note   CSN: [id number] Arrival date & time: 04/05/23  1029     History  Chief Complaint  Patient presents with   Abdominal Pain    [name] is a 47 y.o. female.  HPI 47 year old female presents with 6 days of abdominal pain.  Symptoms started the day after Thanksgiving.  She has diffuse generalized abdominal pain.  It has been constant and not improving.  She has had persistent nausea and about 3 episodes in total of vomiting.  She is also complaining of some dark stool.  She has been eating less but states whenever she does eat such as having some soup yesterday it did not seem to make the pain worse.  No back pain or chest pain.  For the last 3 days she has felt short of breath.  She has a history of asthma but does not feel like that.  No cough.  No urinary symptoms.  Home Medications Prior to Admission medications   Medication Sig Start Date End Date Taking? Authorizing Provider  dicyclomine (BENTYL) 20 MG tablet Take 1 tablet (20 mg total) by mouth 3 (three) times daily before meals. 04/05/23  Yes Pricilla Loveless, MD  ondansetron (ZOFRAN-ODT) 4 MG disintegrating tablet Take 1 tablet (4 mg total) by mouth every 8 (eight) hours as needed for nausea or vomiting. 04/05/23  Yes Pricilla Loveless, MD  pantoprazole (PROTONIX) 40 MG tablet Take 1 tablet (40 mg total) by mouth daily. 04/05/23  Yes Pricilla Loveless, MD  albuterol (VENTOLIN HFA) 108 (90 Base) MCG/ACT inhaler Inhale 1-2 puffs into the lungs every 4 (four) hours as needed for wheezing or shortness of breath. 04/28/22   Shon Hale, MD  amLODipine (NORVASC) 10 MG tablet Take 1 tablet (10 mg total) by mouth daily. 02/19/23   Kerri Perches, MD  beclomethasone (QVAR REDIHALER) 80 MCG/ACT inhaler Inhale 2 puffs into the lungs 2 (two) times daily. 09/04/22   Alfonse Spruce, MD  betamethasone dipropionate 0.05 % cream Apply topically 2 (two) times  daily. 02/19/23   Kerri Perches, MD  diclofenac (VOLTAREN) 75 MG EC tablet Take 75 mg by mouth 2 (two) times daily. 12/26/22   [provider]  EPINEPHrine 0.3 mg/0.3 mL IJ SOAJ injection Inject 0.3 mg into the muscle as needed for anaphylaxis. 08/01/22   Kerri Perches, MD  Erenumab-aooe (AIMOVIG) 70 MG/ML SOAJ Inject 70 mg into the skin every 30 (thirty) days. Patient taking differently: Inject 70 mg into the skin See admin instructions. Inject once month as needed for migraine reoccurrence. 08/12/19   Glean Salvo, NP  fluticasone Aleda Grana) 50 MCG/ACT nasal spray Place 2 sprays into both nostrils daily. 08/01/22   Kerri Perches, MD  gabapentin (NEURONTIN) 300 MG capsule Take 1 capsule (300 mg total) by mouth at bedtime. 07/10/22   Vickki Hearing, MD  methocarbamol (ROBAXIN) 750 MG tablet Take 1 tablet (750 mg total) by mouth 4 (four) times daily. 07/10/22   Vickki Hearing, MD  metroNIDAZOLE (METROGEL VAGINAL) 0.75 % vaginal gel Place 1 Applicatorful vaginally 2 (two) times daily. 01/17/23   Kerri Perches, MD  montelukast (SINGULAIR) 10 MG tablet Take 1 tablet (10 mg total) by mouth at bedtime. 08/01/22   Kerri Perches, MD  oxyCODONE-acetaminophen (PERCOCET/ROXICET) 5-325 MG tablet Take 1 tablet by mouth 2 (two) times daily. 12/04/22   [provider]  SUMAtriptan (IMITREX) 6 MG/0.5ML SOLN injection Use one  injection at onset of migraine.  May repeat in 2 hrs, if needed.  Max dose: 2 inj/day. This is a 30 day prescription. 08/12/19   Glean Salvo, NP  topiramate (TOPAMAX) 100 MG tablet Take 1 tablet (100 mg total) by mouth 2 (two) times daily. Patient taking differently: Take 100 mg by mouth in the morning. 08/12/19   Glean Salvo, NP  vortioxetine HBr (TRINTELLIX) 5 MG TABS tablet Take 1 tablet (5 mg total) by mouth daily. 03/02/23   Elsie Lincoln, MD      Allergies    Diphenhydramine hcl, Orange fruit, and Latex    Review of Systems   Review  of Systems  Constitutional:  Negative for fever.  Respiratory:  Positive for shortness of breath.   Cardiovascular:  Negative for chest pain.  Gastrointestinal:  Positive for abdominal pain, nausea and vomiting. Negative for diarrhea.    Physical Exam Updated Vital Signs BP (!) 142/99   Pulse 89   Temp 98.8 F (37.1 C) (Oral)   Resp 18   Ht 5' (1.524 m)   Wt 81.6 kg   SpO2 99%   BMI 35.15 kg/m  Physical Exam Vitals and nursing note reviewed.  Constitutional:      General: She is not in acute distress.    Appearance: She is well-developed. She is not ill-appearing or diaphoretic.  HENT:     Head: Normocephalic and atraumatic.  Cardiovascular:     Rate and Rhythm: Normal rate and regular rhythm.     Heart sounds: Normal heart sounds.  Pulmonary:     Effort: Pulmonary effort is normal.     Breath sounds: Normal breath sounds.  Abdominal:     Palpations: Abdomen is soft.     Tenderness: There is generalized abdominal tenderness.  Skin:    General: Skin is warm and dry.  Neurological:     Mental Status: She is alert.     ED Results / Procedures / Treatments   Labs (all labs ordered are listed, but only abnormal results are displayed) Labs Reviewed  COMPREHENSIVE METABOLIC PANEL - Abnormal; Notable for the following components:      Result Value   Potassium 3.0 (*)    All other components within normal limits  CBC - Abnormal; Notable for the following components:   Hemoglobin 15.1 (*)    All other components within normal limits  URINALYSIS, ROUTINE W REFLEX MICROSCOPIC - Abnormal; Notable for the following components:   Ketones, ur 20 (*)    Protein, ur 30 (*)    All other components within normal limits  LIPASE, BLOOD  PREGNANCY, URINE  TROPONIN I (HIGH SENSITIVITY)    EKG EKG Interpretation Date/Time:  Thursday April 05 2023 13:00:52 EST Ventricular Rate:  74 PR Interval:  150 QRS Duration:  85 QT Interval:  429 QTC Calculation: 476 R  Axis:   69  Text Interpretation: Sinus rhythm rate is slower compared to 2023 no acute ST/T changes Confirmed by Pricilla Loveless [phone]) on 04/05/2023 1:31:57 PM  Radiology CT ABDOMEN PELVIS W CONTRAST  Result Date: 04/05/2023 CLINICAL DATA:  Abdominal pain, acute, nonlocalized EXAM: CT ABDOMEN AND PELVIS WITH CONTRAST TECHNIQUE: Multidetector CT imaging of the abdomen and pelvis was performed using the standard protocol following bolus administration of intravenous contrast. RADIATION DOSE REDUCTION: This exam was performed according to the departmental dose-optimization program which includes automated exposure control, adjustment of the mA and/or kV according to patient size and/or use of iterative reconstruction technique.  CONTRAST:  85mL OMNIPAQUE IOHEXOL 350 MG/ML SOLN COMPARISON:  CT scan abdomen and pelvis from 06/26/22. FINDINGS: Lower chest: There are patchy atelectatic changes in the visualized lung bases. No overt consolidation. No pleural effusion. The heart is normal in size. No pericardial effusion. Hepatobiliary: The liver is normal in size. Non-cirrhotic configuration. No suspicious mass. No intrahepatic or extrahepatic bile duct dilation. No calcified gallstones. Normal gallbladder wall thickness. No pericholecystic inflammatory changes. Pancreas: Unremarkable. No pancreatic ductal dilatation or surrounding inflammatory changes. Spleen: Within normal limits. No focal lesion. Adrenals/Urinary Tract: Adrenal glands are unremarkable. No suspicious renal mass. There is a partially exophytic three point nine by four point four cm cyst arising from the right kidney lower pole. No hydronephrosis. No renal or ureteric calculi. Unremarkable urinary bladder. Stomach/Bowel: No disproportionate dilation of the small or large bowel loops. No evidence of abnormal bowel wall thickening or inflammatory changes. The appendix is unremarkable. There are scattered diverticula mainly in the sigmoid colon, without  imaging signs of diverticulitis. Vascular/Lymphatic: There is trace amount of free fluid in the dependent pelvis, likely physiological in the patient of this age group. No pneumoperitoneum. No abdominal or pelvic lymphadenopathy, by size criteria. No aneurysmal dilation of the major abdominal arteries. Reproductive: Lobulated anteverted uterus likely due to multiple underlying leiomyomas, not well evaluated on the current exam. No large adnexal mass seen. Other: There is a tiny fat containing umbilical hernia. The soft tissues and abdominal wall are otherwise unremarkable. Musculoskeletal: No suspicious osseous lesions. There are mild multilevel degenerative changes in the visualized spine. IMPRESSION: *No acute inflammatory process identified within the abdomen or pelvis. *Multiple other nonacute observations, as described above. Electronically Signed   By: Jules Schick M.D.   On: 04/05/2023 15:47   DG Chest 2 View  Result Date: 04/05/2023 CLINICAL DATA:  Dyspnea. EXAM: CHEST - 2 VIEW COMPARISON:  April 27, 2022. FINDINGS: The heart size and mediastinal contours are within normal limits. Both lungs are clear. The visualized skeletal structures are unremarkable. IMPRESSION: No active cardiopulmonary disease. Electronically Signed   By: Lupita Raider M.D.   On: 04/05/2023 12:55    Procedures Procedures    Medications Ordered in ED Medications  lactated ringers bolus 1,000 mL (1,000 mLs Intravenous Bolus 04/05/23 1200)  HYDROmorphone (DILAUDID) injection 1 mg (1 mg Intravenous Given 04/05/23 1202)  ondansetron (ZOFRAN) injection 4 mg (4 mg Intravenous Given 04/05/23 1204)  potassium chloride SA (KLOR-CON M) CR tablet 40 mEq (40 mEq Oral Given 04/05/23 1301)  iohexol (OMNIPAQUE) 350 MG/ML injection 85 mL (85 mLs Intravenous Contrast Given 04/05/23 1400)    ED Course/ Medical Decision Making/ A&P                                 Medical Decision Making Amount and/or Complexity of Data  Reviewed Labs: ordered.    Details: Mild hypokalemia. Radiology: ordered.    Details: No diverticulitis or obstruction ECG/medicine tests: ordered and independent interpretation performed.    Details: No ischemia  Risk Prescription drug management.   Unclear cause of the patient's abdominal pain.  Triage note indicated right upper quadrant tenderness but on my exam and with history she has had diffuse nonspecific pain.  She has an umbilical hernia but it is not containing any bowel and is not incarcerated.  She was given symptomatic treatment here and feels better.  CT is otherwise unremarkable.  She was made aware of the  renal cyst.  I doubt cholecystitis or other occult emergency from this negative CT.  Will treat her with Protonix, Bentyl, and replace her potassium.  Will have her follow-up with PCP.  Given return precautions but appears stable for discharge.        Final Clinical Impression(s) / ED Diagnoses Final diagnoses:  Generalized abdominal pain  Renal cyst, right    Rx / DC Orders ED Discharge Orders          Ordered    pantoprazole (PROTONIX) 40 MG tablet  Daily        04/05/23 1555    dicyclomine (BENTYL) 20 MG tablet  3 times daily before meals        04/05/23 1555    ondansetron (ZOFRAN-ODT) 4 MG disintegrating tablet  Every 8 hours PRN        04/05/23 1555              Pricilla Loveless, MD 04/05/23 1559

## 2023-04-05 NOTE — Discharge Instructions (Addendum)
There is a cyst seen on your right kidney.  You will need to have this followed up with your primary care physician.  It is unclear what is causing your abdominal pain today.  We are going to try some treatments for gastritis and abdominal spasm with the prescription sent.  You are also being sent with a prescription for nausea medicine.  You have a mildly low potassium and are being given a prescription for a couple days of potassium  Follow-up with your primary care physician.  However if you develop new or worsening or uncontrolled pain, vomiting, bleeding in your stools, fever, or any other new/concerning symptoms then return to the ER or call 911.

## 2023-04-05 NOTE — ED Triage Notes (Signed)
RUQ ABD pain started after Thanksgiving Denies diarrhea Complains of body shakes

## 2023-04-07 ENCOUNTER — Other Ambulatory Visit: Payer: Self-pay | Admitting: Family Medicine

## 2023-04-10 ENCOUNTER — Telehealth: Payer: Self-pay | Admitting: Orthopedic Surgery

## 2023-04-10 NOTE — Telephone Encounter (Signed)
[employer] forms received. To [employer]. 

## 2023-04-11 ENCOUNTER — Telehealth (HOSPITAL_COMMUNITY): Payer: Self-pay | Admitting: Psychiatry

## 2023-04-11 ENCOUNTER — Ambulatory Visit (HOSPITAL_COMMUNITY): Payer: BC Managed Care – PPO | Admitting: Psychiatry

## 2023-04-11 NOTE — Telephone Encounter (Signed)
Therapist contacted patient via phone regarding scheduled in office appointment.  Patient reported calling earlier and leaving a voicemail message canceling her appointment today as she is sick.

## 2023-04-18 DIAGNOSIS — M1990 Unspecified osteoarthritis, unspecified site: Secondary | ICD-10-CM | POA: Diagnosis not present

## 2023-04-18 DIAGNOSIS — M545 Low back pain, unspecified: Secondary | ICD-10-CM | POA: Diagnosis not present

## 2023-04-18 DIAGNOSIS — E6609 Other obesity due to excess calories: Secondary | ICD-10-CM | POA: Diagnosis not present

## 2023-04-18 DIAGNOSIS — G8929 Other chronic pain: Secondary | ICD-10-CM | POA: Diagnosis not present

## 2023-04-18 DIAGNOSIS — E559 Vitamin D deficiency, unspecified: Secondary | ICD-10-CM | POA: Diagnosis not present

## 2023-04-18 DIAGNOSIS — E785 Hyperlipidemia, unspecified: Secondary | ICD-10-CM | POA: Diagnosis not present

## 2023-04-18 DIAGNOSIS — Z3200 Encounter for pregnancy test, result unknown: Secondary | ICD-10-CM | POA: Diagnosis not present

## 2023-04-18 DIAGNOSIS — Z6832 Body mass index (BMI) 32.0-32.9, adult: Secondary | ICD-10-CM | POA: Diagnosis not present

## 2023-04-18 DIAGNOSIS — Z79899 Other long term (current) drug therapy: Secondary | ICD-10-CM | POA: Diagnosis not present

## 2023-04-25 ENCOUNTER — Encounter: Payer: Self-pay | Admitting: Family Medicine

## 2023-04-26 MED ORDER — FLUCONAZOLE 150 MG PO TABS: ORAL_TABLET | ORAL | 2 refills | Status: DC

## 2023-04-27 ENCOUNTER — Other Ambulatory Visit: Payer: Self-pay

## 2023-04-27 ENCOUNTER — Encounter: Payer: Self-pay | Admitting: Family Medicine

## 2023-04-27 MED ORDER — ACYCLOVIR 400 MG PO TABS: 400.0000 mg | ORAL_TABLET | Freq: Three times a day (TID) | ORAL | 1 refills | Status: DC

## 2023-05-09 ENCOUNTER — Ambulatory Visit: Payer: BC Managed Care – PPO | Admitting: Family Medicine

## 2023-06-04 ENCOUNTER — Encounter: Payer: Self-pay | Admitting: Family Medicine

## 2023-06-06 ENCOUNTER — Ambulatory Visit: Payer: BC Managed Care – PPO | Admitting: Family Medicine

## 2023-06-06 ENCOUNTER — Encounter: Payer: Self-pay | Admitting: Family Medicine

## 2023-06-06 VITALS — BP 140/98 | HR 92 | Ht 60.0 in | Wt 171.1 lb

## 2023-06-06 DIAGNOSIS — R102 Pelvic and perineal pain: Secondary | ICD-10-CM

## 2023-06-06 DIAGNOSIS — R059 Cough, unspecified: Secondary | ICD-10-CM | POA: Diagnosis not present

## 2023-06-06 DIAGNOSIS — R509 Fever, unspecified: Secondary | ICD-10-CM | POA: Diagnosis not present

## 2023-06-06 DIAGNOSIS — I10 Essential (primary) hypertension: Secondary | ICD-10-CM

## 2023-06-06 DIAGNOSIS — R1900 Intra-abdominal and pelvic swelling, mass and lump, unspecified site: Secondary | ICD-10-CM

## 2023-06-06 MED ORDER — BENZONATATE 100 MG PO CAPS: 100.0000 mg | ORAL_CAPSULE | Freq: Two times a day (BID) | ORAL | 0 refills | Status: DC | PRN

## 2023-06-06 MED ORDER — PROMETHAZINE-DM 6.25-15 MG/5ML PO SYRP: ORAL_SOLUTION | ORAL | 0 refills | Status: DC

## 2023-06-06 MED ORDER — FLUCONAZOLE 150 MG PO TABS: 150.0000 mg | ORAL_TABLET | Freq: Once | ORAL | 0 refills | Status: DC

## 2023-06-06 MED ORDER — AZITHROMYCIN 250 MG PO TABS: ORAL_TABLET | ORAL | 0 refills | Status: AC

## 2023-06-06 NOTE — Patient Instructions (Addendum)
 Keep f/u as before , call if you need me sooner  Fating labs ordered in 12/2022 to be drawn today if fasting or 3 to 5 days before next appt  Flu swab in office today , will treat if positive, will let you know before you leave  Z pack , tessalon  perles , fluconazole , and phenergan  dm prescribed  Work excuse 02/03, return 06/08/2023  Ensure good hydration  You are referred for pelvic uS   Thanks for choosing [facility], we consider it a privelige to serve you.

## 2023-06-06 NOTE — Assessment & Plan Note (Signed)
 Day 4 body aches and chilles with cough and head congesion, test for influenza and covid Rx z pack , tesslon perle and phenergan  dm Work excuse to return 06/08/2023, start -06/04/2023

## 2023-06-07 ENCOUNTER — Encounter: Payer: Self-pay | Admitting: Family Medicine

## 2023-06-07 LAB — VERITOR FLU A/B WAIVED
Influenza A: NEGATIVE
Influenza B: NEGATIVE

## 2023-06-11 DIAGNOSIS — R059 Cough, unspecified: Secondary | ICD-10-CM | POA: Insufficient documentation

## 2023-06-11 DIAGNOSIS — R102 Pelvic and perineal pain: Secondary | ICD-10-CM | POA: Insufficient documentation

## 2023-06-11 DIAGNOSIS — R1900 Intra-abdominal and pelvic swelling, mass and lump, unspecified site: Secondary | ICD-10-CM | POA: Insufficient documentation

## 2023-06-11 NOTE — Assessment & Plan Note (Signed)
 Note made of lobulated womb on recent scan, US  to further / better evaluate

## 2023-06-11 NOTE — Assessment & Plan Note (Signed)
 Elevated at visit, ahs beenm using OTc decongestants, will re ebval  next visit No med change DASH diet and commitment to daily physical activity for a minimum of 30 minutes discussed and encouraged, as a part of hypertension management. The importance of attaining a healthy weight is also discussed.     06/06/2023   10:36 AM 06/06/2023   10:35 AM 04/05/2023   10:53 AM 04/05/2023   10:51 AM 02/22/2023    8:56 AM 02/15/2023   11:03 AM 01/05/2023   11:16 AM  BP/Weight  Systolic BP 140 148 142  152 113 126  Diastolic BP 98 97 99  95 75 86  Wt. (Lbs)  171.08  180  183 181.12  BMI  33.41 kg/m2  35.15 kg/m2  35.74 kg/m2 35.37 kg/m2     '

## 2023-06-11 NOTE — Assessment & Plan Note (Signed)
 Chroniuc pelvic pain , recent scan reports lobulated womb not well visualized , will order US  to further eval

## 2023-06-11 NOTE — Progress Notes (Signed)
 Rebecca Boyd     MRN: [id number]      DOB: 06-09-1975  Chief Complaint  Patient presents with   Cough    Cough body aches chills runny nose started Sunday other symptoms have gotten better still coughing     HPI Rebecca Boyd is here with a 3 day h/o above complaints. Has negative home covid test, cough still the biggest problem , aches , chills and runny nose improving  Unable to work due to acute illness  ROS. Denies chest pains, palpitations and leg swelling Denies abdominal pain, nausea, vomiting,diarrhea or constipation.   Denies dysuria, frequency, hesitancy or incontinence. C/o pelvic pain Denies joint pain, swelling and limitation in mobility. Denies headaches, seizures, numbness, or tingling. Denies uncontrolled depression, anxiety or insomnia. Denies skin break down or rash.   PE  BP (!) 140/98 (BP Location: Left Arm, Patient Position: Sitting, Cuff Size: Large)   Pulse 92   Ht 5' (1.524 m)   Wt 171 lb 1.3 oz (77.6 kg)   SpO2 96%   BMI 33.41 kg/m   Patient alert and oriented and in no cardiopulmonary distress.Ill appearing, head and chest congestion  HEENT: No facial asymmetry, EOMI,     Neck supple .Nasal congestion, coughing during visit, no sinus tenderness or cervical adenitis  Chest: decreased air entry bilateral crackles , few wheezes  CVS: S1, S2 no murmurs, no S3.Regular rate.  ABD: Soft non tender.   Ext: No edema  MS: Adequate ROM spine, shoulders, hips and knees.  Skin: Intact, no ulcerations or rash noted.  Psych: Good eye contact, normal affect. Memory intact not anxious or depressed appearing.  CNS: CN 2-12 intact, power,  normal throughout.no focal deficits noted.   Assessment & Plan  Febrile illness, acute Day 4 body aches and chilles with cough and head congesion, test for influenza and covid Rx z pack , tesslon perle and phenergan  dm Work excuse to return 06/08/2023, start -06/04/2023  Cough Negative flu swab, Z ack and  symptomatic treatment , work excuse to cover illness  Pelvic pain Chroniuc pelvic pain , recent scan reports lobulated womb not well visualized , will order US  to further eval  Pelvic mass Note made of lobulated womb on recent scan, US  to further / better evaluate  Morbid obesity due to excess calories [facility])  Patient re-educated about  the importance of commitment to a  minimum of 150 minutes of exercise per week as able.  The importance of healthy food choices with portion control discussed, as well as eating regularly and within a 12 hour window most days. The need to choose clean , green food 50 to 75% of the time is discussed, as well as to make water  the primary drink and set a goal of 64 ounces water  daily.       06/06/2023   10:35 AM 04/05/2023   10:51 AM 02/15/2023   11:03 AM  Weight /BMI  Weight 171 lb 1.3 oz 180 lb 183 lb  Height 5' (1.524 m) 5' (1.524 m) 5' (1.524 m)  BMI 33.41 kg/m2 35.15 kg/m2 35.74 kg/m2    Improving with lifestyle change , applauded on this and encouraged to continue same  Essential hypertension Elevated at visit, ahs beenm using OTc decongestants, will re ebval  next visit No med change DASH diet and commitment to daily physical activity for a minimum of 30 minutes discussed and encouraged, as a part of hypertension management. The importance of attaining a healthy weight  is also discussed.     06/06/2023   10:36 AM 06/06/2023   10:35 AM 04/05/2023   10:53 AM 04/05/2023   10:51 AM 02/22/2023    8:56 AM 02/15/2023   11:03 AM 01/05/2023   11:16 AM  BP/Weight  Systolic BP 140 148 142  152 113 126  Diastolic BP 98 97 99  95 75 86  Wt. (Lbs)  171.08  180  183 181.12  BMI  33.41 kg/m2  35.15 kg/m2  35.74 kg/m2 35.37 kg/m2     '

## 2023-06-11 NOTE — Assessment & Plan Note (Signed)
 Negative flu swab, Z ack and symptomatic treatment , work excuse to cover illness

## 2023-06-11 NOTE — Assessment & Plan Note (Signed)
  Patient re-educated about  the importance of commitment to a  minimum of 150 minutes of exercise per week as able.  The importance of healthy food choices with portion control discussed, as well as eating regularly and within a 12 hour window most days. The need to choose "clean , green" food 50 to 75% of the time is discussed, as well as to make water  the primary drink and set a goal of 64 ounces water  daily.       06/06/2023   10:35 AM 04/05/2023   10:51 AM 02/15/2023   11:03 AM  Weight /BMI  Weight 171 lb 1.3 oz 180 lb 183 lb  Height 5' (1.524 m) 5' (1.524 m) 5' (1.524 m)  BMI 33.41 kg/m2 35.15 kg/m2 35.74 kg/m2    Improving with lifestyle change , applauded on this and encouraged to continue same

## 2023-06-14 ENCOUNTER — Ambulatory Visit (HOSPITAL_COMMUNITY)
Admission: RE | Admit: 2023-06-14 | Discharge: 2023-06-14 | Disposition: A | Discharge status: Home / Self Care | Payer: BC Managed Care – PPO | Source: Ambulatory Visit | Attending: Family Medicine | Admitting: Family Medicine

## 2023-06-14 DIAGNOSIS — R102 Pelvic and perineal pain: Secondary | ICD-10-CM | POA: Diagnosis not present

## 2023-06-14 DIAGNOSIS — R1900 Intra-abdominal and pelvic swelling, mass and lump, unspecified site: Secondary | ICD-10-CM | POA: Insufficient documentation

## 2023-06-14 DIAGNOSIS — N854 Malposition of uterus: Secondary | ICD-10-CM | POA: Diagnosis not present

## 2023-06-14 DIAGNOSIS — D259 Leiomyoma of uterus, unspecified: Secondary | ICD-10-CM | POA: Diagnosis not present

## 2023-06-15 ENCOUNTER — Encounter: Payer: Self-pay | Admitting: Family Medicine

## 2023-06-18 IMAGING — DX DG HIP (WITH OR WITHOUT PELVIS) 3-4V BILAT
5 series · 5 of 5 positions shown · non-contrast
Comparison: 04/23/2021 abdomen/pelvis CT

CLINICAL DATA: 45-year-old female with bilateral hip pain.

EXAM:
DG HIP (WITH OR WITHOUT PELVIS) 5V BILAT

[pelvis ap]
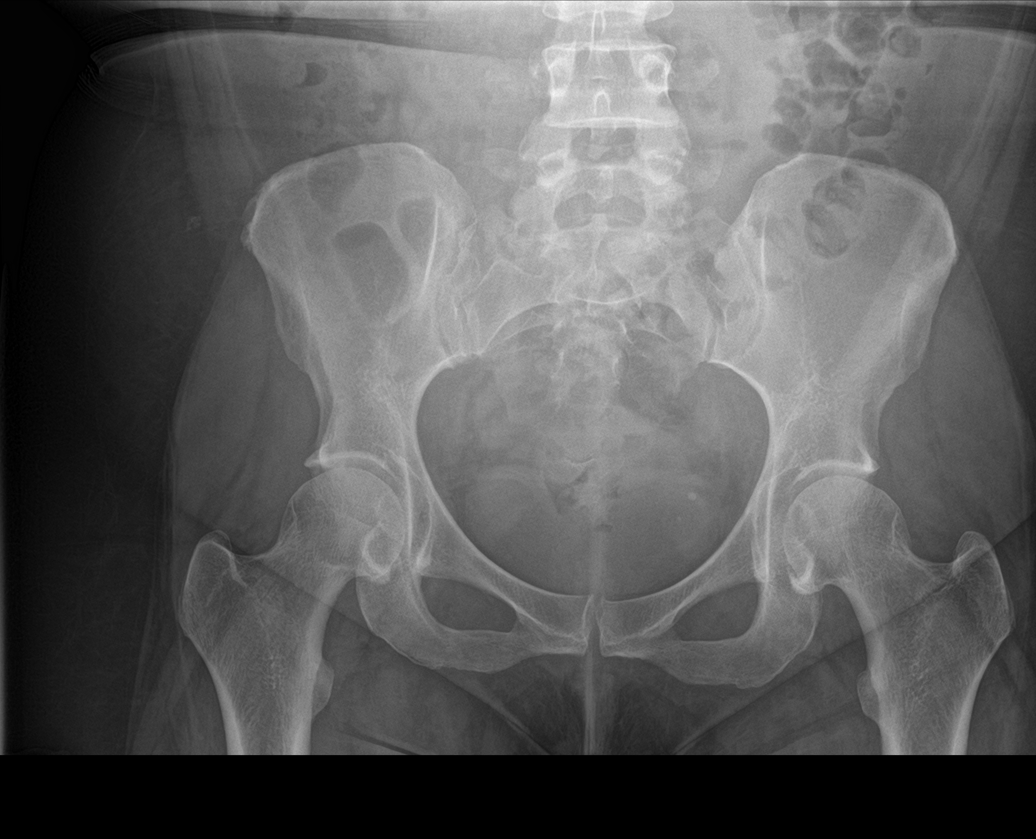

[hip ap (1 of 2)]
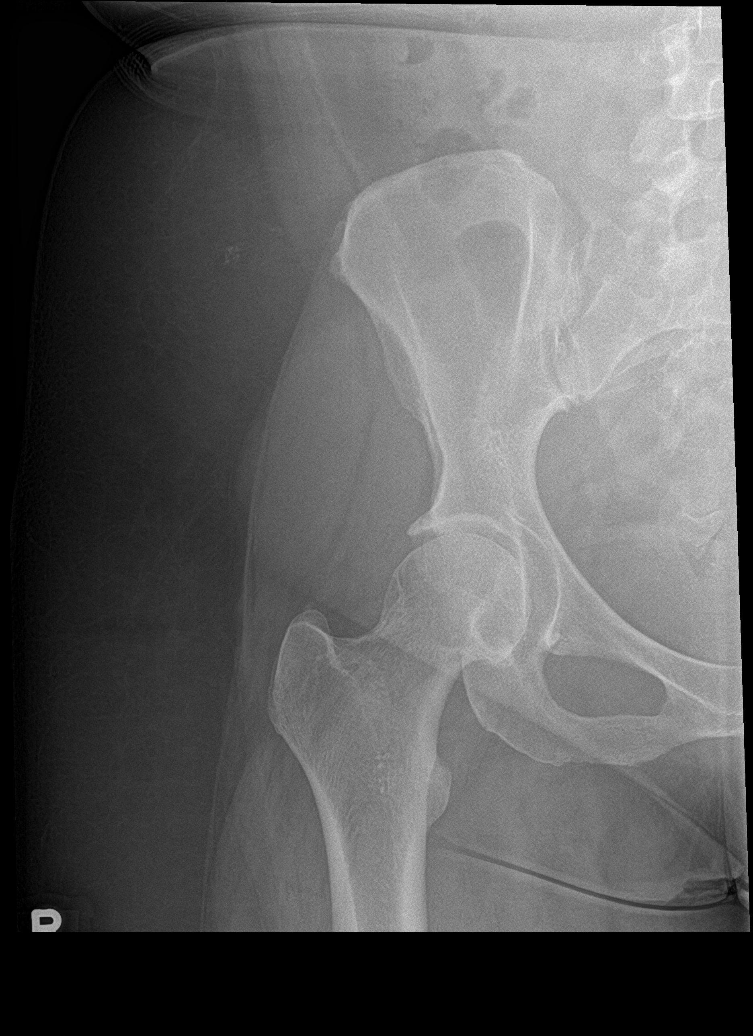

[hip lat (1 of 2)]
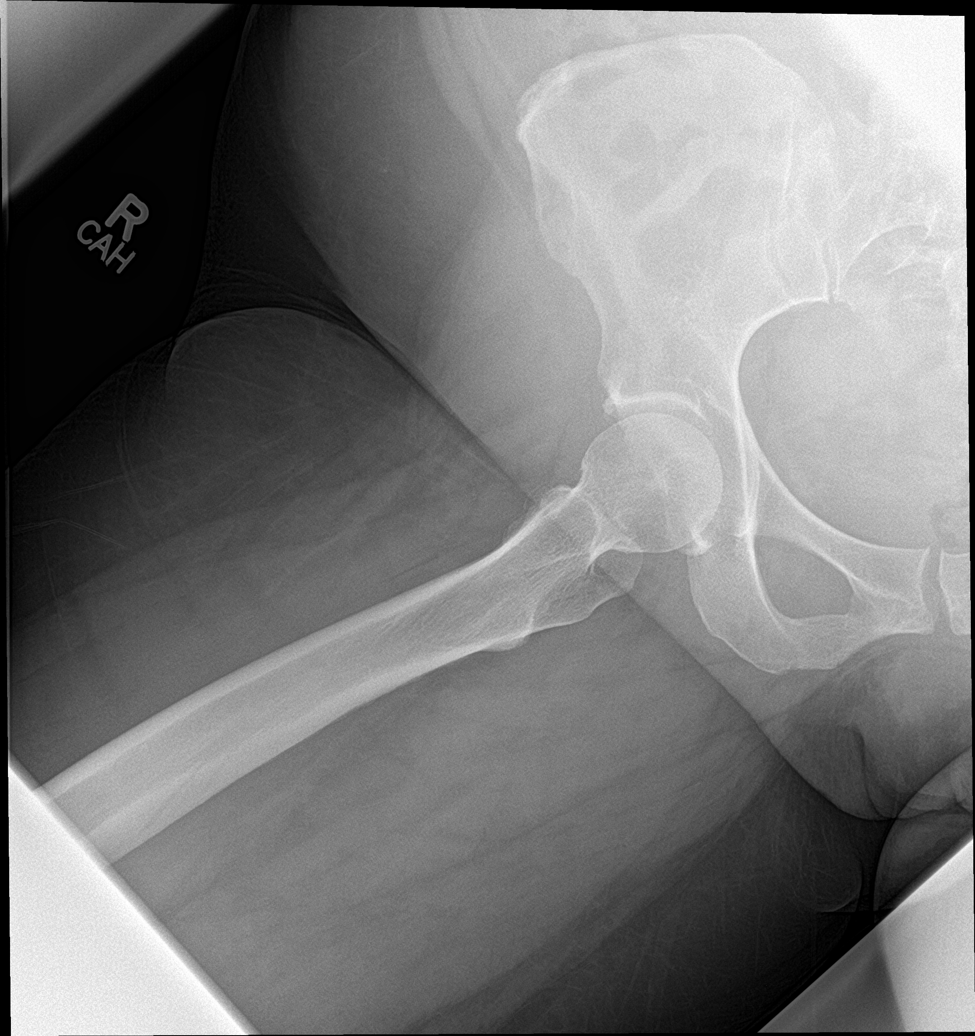

[hip ap (2 of 2)]
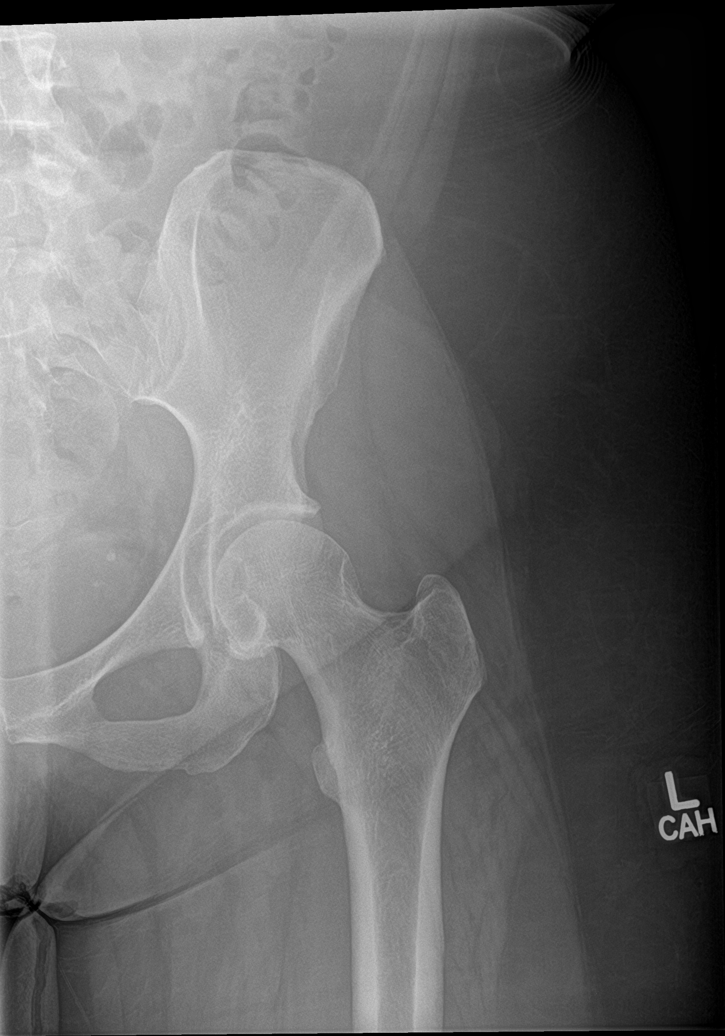

[hip lat (2 of 2)]
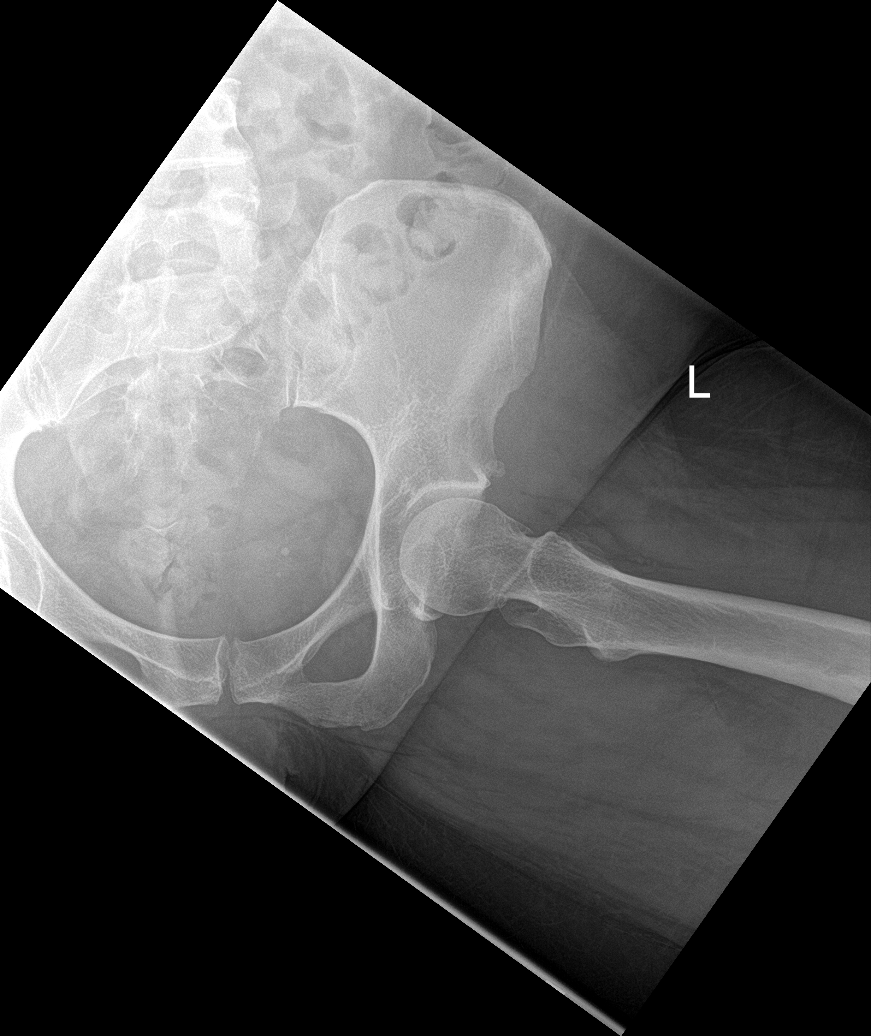

[5 of 5 positions shown; findings below may reference images not displayed]

FINDINGS: Very mild joint space narrowing noted bilaterally.

No other hip joint abnormalities are noted.

No acute fracture, subluxation, dislocation or focal bony lesions
noted.
IMPRESSION: Very mild degenerative changes within both hips. No acute
abnormalities.

## 2023-06-19 MED ORDER — BETAMETHASONE DIPROPIONATE 0.05 % EX CREA: TOPICAL_CREAM | Freq: Two times a day (BID) | CUTANEOUS | 2 refills | Status: DC

## 2023-06-26 ENCOUNTER — Ambulatory Visit: Payer: Self-pay | Admitting: Family Medicine

## 2023-07-11 ENCOUNTER — Encounter: Payer: Self-pay | Admitting: *Deleted

## 2023-07-11 ENCOUNTER — Encounter: Payer: Self-pay | Admitting: Orthopedic Surgery

## 2023-07-11 MED ORDER — CYCLOBENZAPRINE HCL 10 MG PO TABS: 10.0000 mg | ORAL_TABLET | Freq: Every day | ORAL | 0 refills | Status: DC

## 2023-07-11 NOTE — Addendum Note (Signed)
 Addended by: Earnstine Regal on: 07/11/2023 01:47 PM   Modules accepted: Orders

## 2023-07-19 DIAGNOSIS — Z79899 Other long term (current) drug therapy: Secondary | ICD-10-CM | POA: Diagnosis not present

## 2023-07-19 DIAGNOSIS — M545 Low back pain, unspecified: Secondary | ICD-10-CM | POA: Diagnosis not present

## 2023-07-19 DIAGNOSIS — Z3200 Encounter for pregnancy test, result unknown: Secondary | ICD-10-CM | POA: Diagnosis not present

## 2023-07-19 DIAGNOSIS — E6609 Other obesity due to excess calories: Secondary | ICD-10-CM | POA: Diagnosis not present

## 2023-07-19 DIAGNOSIS — G8929 Other chronic pain: Secondary | ICD-10-CM | POA: Diagnosis not present

## 2023-07-19 DIAGNOSIS — I10 Essential (primary) hypertension: Secondary | ICD-10-CM | POA: Diagnosis not present

## 2023-07-19 DIAGNOSIS — Z6833 Body mass index (BMI) 33.0-33.9, adult: Secondary | ICD-10-CM | POA: Diagnosis not present

## 2023-07-23 DIAGNOSIS — Z79899 Other long term (current) drug therapy: Secondary | ICD-10-CM | POA: Diagnosis not present

## 2023-08-01 DIAGNOSIS — E785 Hyperlipidemia, unspecified: Secondary | ICD-10-CM | POA: Diagnosis not present

## 2023-08-01 DIAGNOSIS — M47816 Spondylosis without myelopathy or radiculopathy, lumbar region: Secondary | ICD-10-CM | POA: Diagnosis not present

## 2023-08-01 DIAGNOSIS — Z79899 Other long term (current) drug therapy: Secondary | ICD-10-CM | POA: Diagnosis not present

## 2023-08-01 DIAGNOSIS — E6609 Other obesity due to excess calories: Secondary | ICD-10-CM | POA: Diagnosis not present

## 2023-08-01 DIAGNOSIS — Z6832 Body mass index (BMI) 32.0-32.9, adult: Secondary | ICD-10-CM | POA: Diagnosis not present

## 2023-08-01 DIAGNOSIS — Z3200 Encounter for pregnancy test, result unknown: Secondary | ICD-10-CM | POA: Diagnosis not present

## 2023-08-01 DIAGNOSIS — E559 Vitamin D deficiency, unspecified: Secondary | ICD-10-CM | POA: Diagnosis not present

## 2023-08-02 ENCOUNTER — Ambulatory Visit (INDEPENDENT_AMBULATORY_CARE_PROVIDER_SITE_OTHER): Payer: Self-pay | Admitting: Family Medicine

## 2023-08-02 ENCOUNTER — Encounter: Payer: Self-pay | Admitting: Family Medicine

## 2023-08-02 VITALS — BP 116/82 | HR 83 | Resp 16 | Ht 60.0 in | Wt 176.1 lb

## 2023-08-02 DIAGNOSIS — M7989 Other specified soft tissue disorders: Secondary | ICD-10-CM

## 2023-08-02 DIAGNOSIS — E559 Vitamin D deficiency, unspecified: Secondary | ICD-10-CM

## 2023-08-02 DIAGNOSIS — Z1231 Encounter for screening mammogram for malignant neoplasm of breast: Secondary | ICD-10-CM | POA: Diagnosis not present

## 2023-08-02 DIAGNOSIS — E785 Hyperlipidemia, unspecified: Secondary | ICD-10-CM | POA: Diagnosis not present

## 2023-08-02 DIAGNOSIS — M545 Low back pain, unspecified: Secondary | ICD-10-CM | POA: Diagnosis not present

## 2023-08-02 DIAGNOSIS — M79662 Pain in left lower leg: Secondary | ICD-10-CM

## 2023-08-02 DIAGNOSIS — Z0001 Encounter for general adult medical examination with abnormal findings: Secondary | ICD-10-CM | POA: Diagnosis not present

## 2023-08-02 DIAGNOSIS — J309 Allergic rhinitis, unspecified: Secondary | ICD-10-CM

## 2023-08-02 DIAGNOSIS — R7989 Other specified abnormal findings of blood chemistry: Secondary | ICD-10-CM | POA: Diagnosis not present

## 2023-08-02 MED ORDER — MONTELUKAST SODIUM 10 MG PO TABS: 10.0000 mg | ORAL_TABLET | Freq: Every day | ORAL | 3 refills | Status: AC

## 2023-08-02 MED ORDER — BETAMETHASONE DIPROPIONATE 0.05 % EX CREA: TOPICAL_CREAM | Freq: Two times a day (BID) | CUTANEOUS | 2 refills | Status: DC

## 2023-08-02 MED ORDER — LIDOCAINE 5 % EX PTCH: 1.0000 | MEDICATED_PATCH | CUTANEOUS | 1 refills | Status: DC

## 2023-08-02 NOTE — Patient Instructions (Addendum)
 F/u in 5 months, call if you need me sooner  Labs  today ordered in September, nurse pls print and give to pt  Pls sched mammogram at checkout  Start dAILY FLONASE , EAR EXAM IS  NORMAL  YOU ARE REFERRED FOR [place] OF LEFT LEG YOU WILL BE CONTACTED WITH APPT INFO pLS SCHEDULE MAMMOGRAM AT CHECKOUT  Lidoderm patch prescribed to help with left leg pain, if covered  Thanks for choosing  Primary Care, we consider it a privelige to serve you.

## 2023-08-03 ENCOUNTER — Encounter: Payer: Self-pay | Admitting: Family Medicine

## 2023-08-03 ENCOUNTER — Encounter (HOSPITAL_COMMUNITY): Payer: Self-pay | Admitting: Pharmacy Technician

## 2023-08-03 ENCOUNTER — Telehealth: Payer: Self-pay | Admitting: Pharmacy Technician

## 2023-08-03 ENCOUNTER — Other Ambulatory Visit (HOSPITAL_COMMUNITY): Payer: Self-pay

## 2023-08-03 DIAGNOSIS — M79662 Pain in left lower leg: Secondary | ICD-10-CM | POA: Insufficient documentation

## 2023-08-03 DIAGNOSIS — Z1231 Encounter for screening mammogram for malignant neoplasm of breast: Secondary | ICD-10-CM | POA: Insufficient documentation

## 2023-08-03 LAB — CBC
Hematocrit: 44 % (ref 34.0–46.6)
Hemoglobin: 14.5 g/dL (ref 11.1–15.9)
MCH: 30 pg (ref 26.6–33.0)
MCHC: 33 g/dL (ref 31.5–35.7)
MCV: 91 fL (ref 79–97)
Platelets: 224 10*3/uL (ref 150–450)
RBC: 4.84 x10E6/uL (ref 3.77–5.28)
RDW: 13.2 % (ref 11.7–15.4)
WBC: 5.8 10*3/uL (ref 3.4–10.8)

## 2023-08-03 LAB — CMP14+EGFR
ALT: 15 IU/L (ref 0–32)
AST: 16 IU/L (ref 0–40)
Albumin: 4.3 g/dL (ref 3.9–4.9)
Alkaline Phosphatase: 99 IU/L (ref 44–121)
BUN/Creatinine Ratio: 14 (ref 9–23)
BUN: 10 mg/dL (ref 6–24)
Bilirubin Total: 0.2 mg/dL (ref 0.0–1.2)
CO2: 21 mmol/L (ref 20–29)
Calcium: 9.6 mg/dL (ref 8.7–10.2)
Chloride: 104 mmol/L (ref 96–106)
Creatinine, Ser: 0.74 mg/dL (ref 0.57–1.00)
Globulin, Total: 2.7 g/dL (ref 1.5–4.5)
Glucose: 80 mg/dL (ref 70–99)
Potassium: 3.6 mmol/L (ref 3.5–5.2)
Sodium: 141 mmol/L (ref 134–144)
Total Protein: 7 g/dL (ref 6.0–8.5)
eGFR: 100 mL/min/{1.73_m2} (ref >59)

## 2023-08-03 LAB — LIPID PANEL
Chol/HDL Ratio: 3.8 ratio (ref 0.0–4.4)
Cholesterol, Total: 189 mg/dL (ref 100–199)
HDL: 50 mg/dL (ref >39)
LDL Chol Calc (NIH): 114 mg/dL — ABNORMAL HIGH (ref 0–99)
Triglycerides: 139 mg/dL (ref 0–149)
VLDL Cholesterol Cal: 25 mg/dL (ref 5–40)

## 2023-08-03 LAB — TSH: TSH: 1.01 u[IU]/mL (ref 0.450–4.500)

## 2023-08-03 LAB — VITAMIN D 25 HYDROXY (VIT D DEFICIENCY, FRACTURES): Vit D, 25-Hydroxy: 13.3 ng/mL — ABNORMAL LOW (ref 30.0–100.0)

## 2023-08-03 MED ORDER — VITAMIN D (ERGOCALCIFEROL) 1.25 MG (50000 UNIT) PO CAPS: 50000.0000 [IU] | ORAL_CAPSULE | ORAL | 3 refills | Status: DC

## 2023-08-03 NOTE — Telephone Encounter (Signed)
 Pharmacy Patient Advocate Encounter   Received notification from Patient Pharmacy that prior authorization for Lidocaine 5% patches is required/requested.   Insurance verification completed.   The patient is insured through [employer]  .   Per test claim: PT DOES NOT HAVE A QUALIFYING DIAGNOSIS FOR PRIOR AUTHORIZATION APPROVAL. SHE CAN PURCHASE THE 5% AS SELF PAY OR THERE IS ALSO A 4% OTC AVAILABLE.

## 2023-08-03 NOTE — Assessment & Plan Note (Signed)
 Increased and uncontrolled x 2 months, radiating down LLE, lidoderm prescribed , needs to f/u with Ortho who is treating

## 2023-08-03 NOTE — Addendum Note (Signed)
 Addended by: Kerri Perches on: 08/03/2023 12:22 PM   Modules accepted: Orders

## 2023-08-03 NOTE — Telephone Encounter (Signed)
ERROR

## 2023-08-03 NOTE — Assessment & Plan Note (Signed)
 Annual exam as documented. . Immunization and cancer screening needs are specifically addressed at this visit.

## 2023-08-03 NOTE — Assessment & Plan Note (Signed)
 Needs weekly vit D, leel very low

## 2023-08-03 NOTE — Assessment & Plan Note (Signed)
 Hyperlipidemia:Low fat diet discussed and encouraged.   Lipid Panel  Lab Results  Component Value Date   CHOL 189 08/02/2023   HDL 50 08/02/2023   LDLCALC 114 (H) 08/02/2023   TRIG 139 08/02/2023   CHOLHDL 3.8 08/02/2023     Needs to lower fat intake

## 2023-08-03 NOTE — Progress Notes (Signed)
    Rebecca Boyd     MRN: [id number]      DOB: 1976-01-09  Chief Complaint  Patient presents with   Annual Exam    Annual Exam    Ear Fullness    Bilateral ear fullness x1 week. Denies pain or fever.     HPI: Patient is in for annual physical exam. Concern  as stated above C/o LLE swelling and discomfort x 2 months, no dyspnea or hemoptysis Immunization is reviewed , and  updated if needed.   PE: BP 116/82   Pulse 83   Resp 16   Ht 5' (1.524 m)   Wt 176 lb 1.9 oz (79.9 kg)   SpO2 96%   BMI 34.40 kg/m   Pleasant  female, alert and oriented x 3, in no cardio-pulmonary distress. Afebrile. HEENT No facial trauma or asymetry. Sinuses non tender.  Extra occullar muscles intact..TM clear bilaterally, good light reflex External ears normal, . Neck: supple, no adenopathy,JVD or thyromegaly.No bruits.  Chest: Clear to ascultation bilaterally.No crackles or wheezes. Non tender to palpation  Breast: Asymptomatic, not examined , needs mammogram  Cardiovascular system; Heart sounds normal,  S1 and  S2 ,no S3.  No murmur, or thrill. Apical beat not displaced Peripheral pulses normal.  Abdomen: Soft, non tender, no organomegaly or masses. No bruits. Bowel sounds normal. No guarding, tenderness or rebound.    Musculoskeletal exam: Full ROM of spine, hips , shoulders and knees. No deformity ,swelling or crepitus noted. No muscle wasting or atrophy.   Neurologic: Cranial nerves 2 to 12 intact. Power, tone ,sensation and reflexes normal throughout. No disturbance in gait. No tremor.  Skin: Intact,hyperpigmented macular lesions  Psych; Normal mood and affect. Judgement and concentration normal   Assessment & Plan:  Vitamin D deficiency Needs weekly vit D, leel very low  Pain and swelling of left lower leg Pain and swelling of LLE  x 2 months,  abnormal exam,  needs ultrasound  Low back pain with radiation Increased and uncontrolled x 2 months, radiating  down LLE, lidoderm prescribed , needs to f/u with Ortho who is treating  Allergic rhinitis Currently uncontrolled, needs to commit to daily medication  Annual visit for general adult medical examination with abnormal findings Annual exam as documented. Immunization and cancer screening needs are specifically addressed at this visit.   Dyslipidemia Hyperlipidemia:Low fat diet discussed and encouraged.   Lipid Panel  Lab Results  Component Value Date   CHOL 189 08/02/2023   HDL 50 08/02/2023   LDLCALC 114 (H) 08/02/2023   TRIG 139 08/02/2023   CHOLHDL 3.8 08/02/2023     Needs to lower fat intake

## 2023-08-03 NOTE — Assessment & Plan Note (Addendum)
 Pain and swelling of LLE  x 2 months,  abnormal exam,  needs ultrasound

## 2023-08-03 NOTE — Assessment & Plan Note (Signed)
 Currently uncontrolled, needs to commit to daily medication

## 2023-08-06 NOTE — Telephone Encounter (Signed)
 First attempt. N/A. LVM to CB

## 2023-08-07 DIAGNOSIS — Z79899 Other long term (current) drug therapy: Secondary | ICD-10-CM | POA: Diagnosis not present

## 2023-08-08 ENCOUNTER — Encounter: Payer: Self-pay | Admitting: Orthopedic Surgery

## 2023-08-08 DIAGNOSIS — M5432 Sciatica, left side: Secondary | ICD-10-CM

## 2023-08-08 DIAGNOSIS — M5116 Intervertebral disc disorders with radiculopathy, lumbar region: Secondary | ICD-10-CM

## 2023-08-13 ENCOUNTER — Other Ambulatory Visit: Payer: Self-pay | Admitting: Orthopedic Surgery

## 2023-08-13 DIAGNOSIS — M5116 Intervertebral disc disorders with radiculopathy, lumbar region: Secondary | ICD-10-CM

## 2023-08-13 DIAGNOSIS — M5431 Sciatica, right side: Secondary | ICD-10-CM

## 2023-08-14 NOTE — Discharge Instructions (Signed)

## 2023-08-15 ENCOUNTER — Ambulatory Visit
Admission: RE | Admit: 2023-08-15 | Discharge: 2023-08-15 | Disposition: A | Discharge status: Home / Self Care | Source: Ambulatory Visit | Attending: Orthopedic Surgery | Admitting: Orthopedic Surgery

## 2023-08-15 ENCOUNTER — Inpatient Hospital Stay
Admission: RE | Admit: 2023-08-15 | Discharge: 2023-08-15 | Disposition: A | Discharge status: Home / Self Care | Source: Ambulatory Visit | Attending: Orthopedic Surgery | Admitting: Orthopedic Surgery

## 2023-08-15 DIAGNOSIS — M5116 Intervertebral disc disorders with radiculopathy, lumbar region: Secondary | ICD-10-CM

## 2023-08-15 DIAGNOSIS — M5431 Sciatica, right side: Secondary | ICD-10-CM

## 2023-08-15 DIAGNOSIS — M4727 Other spondylosis with radiculopathy, lumbosacral region: Secondary | ICD-10-CM | POA: Diagnosis not present

## 2023-08-15 MED ORDER — METHYLPREDNISOLONE ACETATE 40 MG/ML INJ SUSP (RADIOLOG
80.0000 mg | Freq: Once | INTRAMUSCULAR | Status: AC
  Administered 2023-08-15: 80 mg via EPIDURAL

## 2023-08-15 MED ORDER — IOPAMIDOL (ISOVUE-M 200) INJECTION 41%
1.0000 mL | Freq: Once | INTRAMUSCULAR | Status: AC
  Administered 2023-08-15: 1 mL via EPIDURAL

## 2023-08-15 NOTE — Discharge Instructions (Signed)

## 2023-08-16 ENCOUNTER — Ambulatory Visit (HOSPITAL_COMMUNITY)
Admission: RE | Admit: 2023-08-16 | Discharge: 2023-08-16 | Disposition: A | Discharge status: Home / Self Care | Source: Ambulatory Visit | Attending: Family Medicine | Admitting: Family Medicine

## 2023-08-16 DIAGNOSIS — M79652 Pain in left thigh: Secondary | ICD-10-CM | POA: Diagnosis not present

## 2023-08-16 DIAGNOSIS — M7989 Other specified soft tissue disorders: Secondary | ICD-10-CM | POA: Diagnosis not present

## 2023-08-16 DIAGNOSIS — M79662 Pain in left lower leg: Secondary | ICD-10-CM | POA: Diagnosis not present

## 2023-08-27 ENCOUNTER — Encounter: Payer: Self-pay | Admitting: Family Medicine

## 2023-08-28 ENCOUNTER — Other Ambulatory Visit: Payer: Self-pay | Admitting: Family Medicine

## 2023-09-04 DIAGNOSIS — Z3200 Encounter for pregnancy test, result unknown: Secondary | ICD-10-CM | POA: Diagnosis not present

## 2023-09-04 DIAGNOSIS — E559 Vitamin D deficiency, unspecified: Secondary | ICD-10-CM | POA: Diagnosis not present

## 2023-09-04 DIAGNOSIS — Z79899 Other long term (current) drug therapy: Secondary | ICD-10-CM | POA: Diagnosis not present

## 2023-09-04 DIAGNOSIS — M47816 Spondylosis without myelopathy or radiculopathy, lumbar region: Secondary | ICD-10-CM | POA: Diagnosis not present

## 2023-09-04 DIAGNOSIS — Z6832 Body mass index (BMI) 32.0-32.9, adult: Secondary | ICD-10-CM | POA: Diagnosis not present

## 2023-09-04 DIAGNOSIS — E6609 Other obesity due to excess calories: Secondary | ICD-10-CM | POA: Diagnosis not present

## 2023-09-04 DIAGNOSIS — E785 Hyperlipidemia, unspecified: Secondary | ICD-10-CM | POA: Diagnosis not present

## 2023-09-10 DIAGNOSIS — Z79899 Other long term (current) drug therapy: Secondary | ICD-10-CM | POA: Diagnosis not present

## 2023-09-17 ENCOUNTER — Encounter: Payer: Self-pay | Admitting: Orthopedic Surgery

## 2023-09-17 ENCOUNTER — Other Ambulatory Visit: Payer: Self-pay | Admitting: Orthopedic Surgery

## 2023-09-17 MED ORDER — CYCLOBENZAPRINE HCL 10 MG PO TABS
10.0000 mg | ORAL_TABLET | Freq: Every day | ORAL | 0 refills | Status: DC
Start: 2023-09-17 — End: 2024-01-22

## 2023-09-19 ENCOUNTER — Encounter (HOSPITAL_COMMUNITY): Payer: Self-pay

## 2023-09-19 ENCOUNTER — Other Ambulatory Visit: Payer: Self-pay

## 2023-09-19 ENCOUNTER — Emergency Department (HOSPITAL_COMMUNITY)
Admission: EM | Admit: 2023-09-19 | Discharge: 2023-09-20 | Disposition: A | Discharge status: Home / Self Care | Attending: Emergency Medicine | Admitting: Emergency Medicine

## 2023-09-19 DIAGNOSIS — Z79899 Other long term (current) drug therapy: Secondary | ICD-10-CM | POA: Diagnosis not present

## 2023-09-19 DIAGNOSIS — Z91040 Latex allergy status: Secondary | ICD-10-CM | POA: Insufficient documentation

## 2023-09-19 DIAGNOSIS — R109 Unspecified abdominal pain: Secondary | ICD-10-CM

## 2023-09-19 DIAGNOSIS — R1031 Right lower quadrant pain: Secondary | ICD-10-CM | POA: Insufficient documentation

## 2023-09-19 DIAGNOSIS — N3289 Other specified disorders of bladder: Secondary | ICD-10-CM | POA: Diagnosis not present

## 2023-09-19 DIAGNOSIS — J45909 Unspecified asthma, uncomplicated: Secondary | ICD-10-CM | POA: Diagnosis not present

## 2023-09-19 DIAGNOSIS — I10 Essential (primary) hypertension: Secondary | ICD-10-CM | POA: Diagnosis not present

## 2023-09-19 DIAGNOSIS — Z7951 Long term (current) use of inhaled steroids: Secondary | ICD-10-CM | POA: Insufficient documentation

## 2023-09-19 DIAGNOSIS — K5730 Diverticulosis of large intestine without perforation or abscess without bleeding: Secondary | ICD-10-CM | POA: Diagnosis not present

## 2023-09-19 DIAGNOSIS — N281 Cyst of kidney, acquired: Secondary | ICD-10-CM | POA: Diagnosis not present

## 2023-09-19 NOTE — ED Triage Notes (Signed)
 Pov from home. Cc of right flank pain for a couple days but is getting worse.  Denies urinary s/s.

## 2023-09-20 ENCOUNTER — Encounter: Payer: Self-pay | Admitting: Orthopedic Surgery

## 2023-09-20 ENCOUNTER — Other Ambulatory Visit: Payer: Self-pay | Admitting: Orthopedic Surgery

## 2023-09-20 ENCOUNTER — Emergency Department (HOSPITAL_COMMUNITY)

## 2023-09-20 DIAGNOSIS — K5730 Diverticulosis of large intestine without perforation or abscess without bleeding: Secondary | ICD-10-CM | POA: Diagnosis not present

## 2023-09-20 DIAGNOSIS — R109 Unspecified abdominal pain: Secondary | ICD-10-CM | POA: Diagnosis not present

## 2023-09-20 DIAGNOSIS — M5116 Intervertebral disc disorders with radiculopathy, lumbar region: Secondary | ICD-10-CM

## 2023-09-20 DIAGNOSIS — N3289 Other specified disorders of bladder: Secondary | ICD-10-CM | POA: Diagnosis not present

## 2023-09-20 DIAGNOSIS — N281 Cyst of kidney, acquired: Secondary | ICD-10-CM | POA: Diagnosis not present

## 2023-09-20 DIAGNOSIS — M5431 Sciatica, right side: Secondary | ICD-10-CM

## 2023-09-20 LAB — CBC
HCT: 41 % (ref 36.0–46.0)
Hemoglobin: 14.6 g/dL (ref 12.0–15.0)
MCH: 31.1 pg (ref 26.0–34.0)
MCHC: 35.6 g/dL (ref 30.0–36.0)
MCV: 87.4 fL (ref 80.0–100.0)
Platelets: 196 10*3/uL (ref 150–400)
RBC: 4.69 MIL/uL (ref 3.87–5.11)
RDW: 12.8 % (ref 11.5–15.5)
WBC: 4.9 10*3/uL (ref 4.0–10.5)
nRBC: 0 % (ref 0.0–0.2)

## 2023-09-20 LAB — BASIC METABOLIC PANEL WITH GFR
Anion gap: 4 — ABNORMAL LOW (ref 5–15)
BUN: 11 mg/dL (ref 6–20)
CO2: 26 mmol/L (ref 22–32)
Calcium: 9.1 mg/dL (ref 8.9–10.3)
Chloride: 107 mmol/L (ref 98–111)
Creatinine, Ser: 0.71 mg/dL (ref 0.44–1.00)
GFR, Estimated: >60 mL/min (ref >60)
Glucose, Bld: 118 mg/dL — ABNORMAL HIGH (ref 70–99)
Potassium: 3.1 mmol/L — ABNORMAL LOW (ref 3.5–5.1)
Sodium: 137 mmol/L (ref 135–145)

## 2023-09-20 LAB — URINALYSIS, W/ REFLEX TO CULTURE (INFECTION SUSPECTED)
Bacteria, UA: NONE SEEN
Bilirubin Urine: NEGATIVE
Glucose, UA: NEGATIVE mg/dL
Hgb urine dipstick: NEGATIVE
Ketones, ur: NEGATIVE mg/dL
Leukocytes,Ua: NEGATIVE
Nitrite: NEGATIVE
Protein, ur: NEGATIVE mg/dL
Specific Gravity, Urine: 1.016 (ref 1.005–1.030)
pH: 5 (ref 5.0–8.0)

## 2023-09-20 LAB — PREGNANCY, URINE: Preg Test, Ur: NEGATIVE

## 2023-09-20 MED ORDER — KETOROLAC TROMETHAMINE 15 MG/ML IJ SOLN
15.0000 mg | Freq: Once | INTRAMUSCULAR | Status: AC
  Administered 2023-09-20: 15 mg via INTRAVENOUS
  Filled 2023-09-20: qty 1

## 2023-09-20 MED ORDER — HYDROCODONE-ACETAMINOPHEN 5-325 MG PO TABS
2.0000 | ORAL_TABLET | Freq: Once | ORAL | Status: AC
  Administered 2023-09-20: 2 via ORAL
  Filled 2023-09-20: qty 2

## 2023-09-20 NOTE — ED Provider Notes (Signed)
 [place] EMERGENCY DEPARTMENT AT [facility] Provider Note   CSN: [id number] Arrival date & time: 09/19/23  2315     History  Chief Complaint  Patient presents with   Flank Pain    Rebecca Boyd is a 48 y.o. female.  The history is provided by the patient.  Flank Pain This is a new problem. The current episode started 2 days ago. The problem occurs daily. Associated symptoms include abdominal pain. Pertinent negatives include no chest pain. Nothing relieves the symptoms.  Patient presents with right flank pain for over 2 days.  It is worsening.  It is now radiating into her abdomen.  Denies any fevers, no vomiting or diarrhea.  No chest pain.  She has never had this before.  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/2022   Cervical neck pain with evidence of disc disease 10/02/2013   Depressive disorder, not elsewhere classified    Epistaxis 07/09/2019   Family history of breast cancer    Family history of breast cancer in sister 09/25/2018   Family history of colon cancer    Family history of lung cancer    FH: breast cancer in relative when <47 years old 06/10/2018   Dx at age 70   Headache 03/16/2016   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 Authorizing Provider  acyclovir  (ZOVIRAX ) 400 MG tablet TAKE ONE TABLET BY MOUTH THREE TIMES DAILY  UNTIL GONE 08/28/23   Towanda Fret, MD  albuterol  (VENTOLIN  HFA) 108 [phone] Base) MCG/ACT inhaler Inhale 1-2 puffs into the lungs every 4 (four) hours as needed for wheezing or shortness of breath. 04/28/22   Colin Dawley, MD  amLODipine  (NORVASC ) 10 MG tablet Take 1 tablet (10 mg total) by mouth daily. 02/19/23   Towanda Fret, MD  beclomethasone (QVAR  REDIHALER) 80 MCG/ACT inhaler Inhale 2 puffs into the lungs 2 (two) times daily. 09/04/22   Rochester Chuck, MD  betamethasone  dipropionate 0.05 % cream Apply topically 2 (two) times daily. 08/02/23   Towanda Fret, MD  cyclobenzaprine  (FLEXERIL ) 10 MG tablet Take 1 tablet (10 mg total) by mouth at bedtime. One tablet every night at bedtime as needed for spasm. 09/17/23   Darrin Emerald, MD  diclofenac  (VOLTAREN ) 75 MG EC tablet Take 75 mg by mouth 2 (two) times daily. 12/26/22   [provider]  dicyclomine  (BENTYL ) 20 MG tablet Take 1 tablet (20 mg total) by mouth 3 (three) times daily before meals. 04/05/23   Jerilynn Montenegro, MD  EPINEPHrine  0.3 mg/0.3 mL IJ SOAJ injection Inject 0.3 mg into the muscle as needed for anaphylaxis. 08/01/22   Towanda Fret, MD  Erenumab -aooe (AIMOVIG ) 70 MG/ML SOAJ Inject 70 mg into the skin every 30 (thirty) days. Patient taking differently: Inject 70  mg into the skin See admin instructions. Inject once month as needed for migraine reoccurrence. 08/12/19   Wess Hammed, NP  fluticasone  (FLONASE ) 50 MCG/ACT nasal spray Place 2 sprays into both nostrils daily. 08/01/22   Towanda Fret, MD  gabapentin  (NEURONTIN ) 300 MG capsule Take 1 capsule (300 mg total) by mouth at bedtime. 07/10/22   Darrin Emerald, MD  lidocaine  (LIDODERM ) 5 % Place 1 patch onto the skin daily. Remove & Discard patch within 12 hours or as directed by MD 08/02/23   Towanda Fret, MD  methocarbamol  (ROBAXIN ) 750 MG tablet Take 1 tablet (750 mg total) by mouth 4 (four) times daily. 07/10/22   Darrin Emerald, MD  metroNIDAZOLE  (METROGEL ) 0.75 % vaginal gel Place 1 Applicatorful vaginally 2 (two) times daily. 04/09/23   Towanda Fret, MD  montelukast  (SINGULAIR ) 10 MG tablet Take 1 tablet (10 mg total) by mouth at bedtime. 08/02/23   Towanda Fret, MD  ondansetron  (ZOFRAN -ODT) 4 MG disintegrating tablet Take 1 tablet (4 mg total) by mouth every 8 (eight) hours as needed for nausea or vomiting. 04/05/23   Jerilynn Montenegro, MD  oxyCODONE -acetaminophen  (PERCOCET/ROXICET) 5-325 MG tablet Take 1 tablet by mouth 2 (two) times daily. 12/04/22   [provider]  pantoprazole  (PROTONIX ) 40 MG tablet Take 1 tablet (40 mg total) by mouth daily. 04/05/23   Jerilynn Montenegro, MD  SUMAtriptan  (IMITREX ) 6 MG/0.5ML SOLN injection Use one injection at onset of migraine.  May repeat in 2 hrs, if needed.  Max dose: 2 inj/day. This is a 30 day prescription. 08/12/19   Wess Hammed, NP  topiramate  (TOPAMAX ) 100 MG tablet Take 1 tablet (100 mg total) by mouth 2 (two) times daily. Patient taking differently: Take 100 mg by mouth in the morning. 08/12/19   Wess Hammed, NP  Vitamin D , Ergocalciferol , (DRISDOL ) 1.25 MG (50000 UNIT) CAPS capsule Take 1 capsule (50,000 Units total) by mouth every 7 (seven) days. 08/03/23   Towanda Fret, MD  vortioxetine  HBr (TRINTELLIX ) 5 MG TABS tablet Take 1 tablet (5 mg total) by mouth daily. 03/02/23   Madie Schilling, MD      Allergies    Diphenhydramine hcl, Orange fruit, and Latex    Review of Systems   Review of Systems  Cardiovascular:  Negative for chest pain.  Gastrointestinal:  Positive for abdominal pain.  Genitourinary:  Positive for flank pain. Negative for dysuria.    Physical Exam Updated Vital Signs BP (!) 147/92   Pulse 76   Temp 98.1 F (36.7 C)   Resp 15   Ht 1.524 m (5')   Wt 79.9 kg   SpO2 96%   BMI 34.40 kg/m  Physical Exam CONSTITUTIONAL: Well developed/well nourished HEAD: Normocephalic/atraumatic ENMT: Mucous membranes  moist NECK: supple no meningeal signs CV: S1/S2 noted, no murmurs/rubs/gallops noted LUNGS: Lungs are clear to auscultation bilaterally, no apparent distress ABDOMEN: soft, moderate RLQ tenderness, no rebound or guarding, bowel sounds noted throughout abdomen GU: Right cva tenderness NEURO: Pt is awake/alert/appropriate, moves all extremitiesx4.  No facial droop.   EXTREMITIES: pulses normal/equal, full ROM SKIN: warm, color normal PSYCH: no abnormalities of mood noted, alert and oriented to situation  ED Results / Procedures / Treatments   Labs (all labs ordered are listed, but only abnormal results are displayed) Labs Reviewed  BASIC METABOLIC PANEL WITH GFR - Abnormal; Notable for the following components:      Result Value   Potassium 3.1 (*)  Glucose, Bld 118 (*)    Anion gap 4 (*)    All other components within normal limits  CBC  URINALYSIS, W/ REFLEX TO CULTURE (INFECTION SUSPECTED)  PREGNANCY, URINE    EKG None  Radiology CT Renal Stone Study Result Date: 09/20/2023 CLINICAL DATA:  Right-sided flank pain for 2 days EXAM: CT ABDOMEN AND PELVIS WITHOUT CONTRAST TECHNIQUE: Multidetector CT imaging of the abdomen and pelvis was performed following the standard protocol without IV contrast. RADIATION DOSE REDUCTION: This exam was performed according to the departmental dose-optimization program which includes automated exposure control, adjustment of the mA and/or kV according to patient size and/or use of iterative reconstruction technique. COMPARISON:  04/05/2023 FINDINGS: Lower chest: No acute abnormality. Hepatobiliary: No focal liver abnormality is seen. No gallstones, gallbladder wall thickening, or biliary dilatation. Pancreas: Unremarkable. No pancreatic ductal dilatation or surrounding inflammatory changes. Spleen: Normal in size without focal abnormality. Adrenals/Urinary Tract: Adrenal glands are within normal limits. Kidneys are well visualized bilaterally. No renal  calculi or obstructive changes are seen. Simple renal cyst is noted on the right stable from the prior exam. No follow-up is recommended. The bladder is well distended. Stomach/Bowel: Scattered diverticular change of the colon is noted without evidence of diverticulitis. The appendix is within normal limits. Small bowel and stomach are unremarkable. Vascular/Lymphatic: No significant vascular findings are present. No enlarged abdominal or pelvic lymph nodes. Reproductive: Uterus and bilateral adnexa are unremarkable. Other: No abdominal wall hernia or abnormality. No abdominopelvic ascites. Musculoskeletal: No acute or significant osseous findings. IMPRESSION: Diverticulosis without diverticulitis. No evidence renal calculi or obstructive changes. Normal appearing appendix. Electronically Signed   By: Violeta Grey M.D.   On: 09/20/2023 01:47    Procedures Procedures    Medications Ordered in ED Medications  HYDROcodone -acetaminophen  (NORCO/VICODIN) 5-325 MG per tablet 2 tablet (has no administration in time range)  ketorolac  (TORADOL ) 15 MG/ML injection 15 mg (15 mg Intravenous Given 09/20/23 0136)    ED Course/ Medical Decision Making/ A&P Clinical Course as of 09/20/23 0250  Thu Sep 20, 2023  0249 Patient presented with right flank pain and abdominal pain over the past several days.  Extensive workup was overall unremarkable including CT imaging patient is ambulatory but still reports flank pain and at times the pain is radiating into her leg She has no focal neurodeficits, she has full range of motion and equal power in both legs without difficulty It is possible the pain is musculoskeletal or spinal in origin. No signs of acute spinal emergency  Plan for discharge home, follow-up with PCP and we discussed strict return precautions [DW]    Clinical Course User Index [DW] Eldon Greenland, MD                                 Medical Decision Making Amount and/or Complexity of Data  Reviewed Labs: ordered. Radiology: ordered.  Risk Prescription drug management.   This patient presents to the ED for concern of flank pain, this involves an extensive number of treatment options, and is a complaint that carries with it a high risk of complications and morbidity.  The differential diagnosis includes but is not limited to pyelonephritis, ureteral stone, renal abscess, muscle strain, epidural abscess, discitis, AAA  Comorbidities that complicate the patient evaluation: Patient's presentation is complicated by their history of anxiety  Additional history obtained: Records reviewed Primary Care Documents  Lab Tests: I Ordered, and personally interpreted labs.  The pertinent results include: Labs were overall unremarkable  Imaging Studies ordered: I ordered imaging studies including CT scan renal  I independently visualized and interpreted imaging which showed simple renal cyst, no other acute findings I agree with the radiologist interpretation  Medicines ordered and prescription drug management: I ordered medication including Toradol  for pain Reevaluation of the patient after these medicines showed that the patient    stayed the same  Reevaluation: After the interventions noted above, I reevaluated the patient and found that they have :stayed the same  Complexity of problems addressed: Patient's presentation is most consistent with  acute presentation with potential threat to life or bodily function  Disposition: After consideration of the diagnostic results and the patient's response to treatment,  I feel that the patent would benefit from discharge  .           Final Clinical Impression(s) / ED Diagnoses Final diagnoses:  Right flank pain    Rx / DC Orders ED Discharge Orders     None         Eldon Greenland, MD 09/20/23 [phone]

## 2023-09-20 NOTE — ED Notes (Signed)
 Pt states she is tasting dirt

## 2023-09-20 NOTE — ED Notes (Signed)
 ED Provider at bedside.

## 2023-09-20 NOTE — Discharge Instructions (Signed)
 As we discussed, there is no signs of kidney stones, no signs of kidney infection This may be coming from your back and spine. You can use a heating pad over your flank, and you can use ibuprofen  3 times a day for a week Please see Dr. Rodolph Clap in a week if you are not feeling improved  SEEK IMMEDIATE MEDICAL ATTENTION IF: New numbness, tingling, weakness, or problem with the use of your arms or legs.  Severe back pain not relieved with medications.  Change in bowel or bladder control (if you lose control of stool or urine, or if you are unable to urinate) Increasing pain in any areas of the body (such as chest or abdominal pain).  Shortness of breath, dizziness or fainting.  Nausea (feeling sick to your stomach), vomiting, fever, or sweats.

## 2023-09-26 ENCOUNTER — Other Ambulatory Visit

## 2023-09-28 ENCOUNTER — Other Ambulatory Visit

## 2023-09-28 NOTE — Discharge Instructions (Signed)

## 2023-10-01 ENCOUNTER — Ambulatory Visit
Admission: RE | Admit: 2023-10-01 | Discharge: 2023-10-01 | Disposition: A | Discharge status: Home / Self Care | Source: Ambulatory Visit | Attending: Orthopedic Surgery | Admitting: Orthopedic Surgery

## 2023-10-01 DIAGNOSIS — M4727 Other spondylosis with radiculopathy, lumbosacral region: Secondary | ICD-10-CM | POA: Diagnosis not present

## 2023-10-01 DIAGNOSIS — M5431 Sciatica, right side: Secondary | ICD-10-CM

## 2023-10-01 DIAGNOSIS — M5116 Intervertebral disc disorders with radiculopathy, lumbar region: Secondary | ICD-10-CM

## 2023-10-01 MED ORDER — IOPAMIDOL (ISOVUE-M 200) INJECTION 41%
1.0000 mL | Freq: Once | INTRAMUSCULAR | Status: AC
Start: 2023-10-01 — End: 2023-10-01
  Administered 2023-10-01: 1 mL via EPIDURAL

## 2023-10-01 MED ORDER — METHYLPREDNISOLONE ACETATE 40 MG/ML INJ SUSP (RADIOLOG
80.0000 mg | Freq: Once | INTRAMUSCULAR | Status: AC
Start: 2023-10-01 — End: 2023-10-01
  Administered 2023-10-01: 80 mg via EPIDURAL

## 2023-10-04 ENCOUNTER — Encounter: Payer: Self-pay | Admitting: Family Medicine

## 2023-10-04 DIAGNOSIS — L989 Disorder of the skin and subcutaneous tissue, unspecified: Secondary | ICD-10-CM

## 2023-10-05 DIAGNOSIS — E6609 Other obesity due to excess calories: Secondary | ICD-10-CM | POA: Diagnosis not present

## 2023-10-05 DIAGNOSIS — Z79899 Other long term (current) drug therapy: Secondary | ICD-10-CM | POA: Diagnosis not present

## 2023-10-05 DIAGNOSIS — M47816 Spondylosis without myelopathy or radiculopathy, lumbar region: Secondary | ICD-10-CM | POA: Diagnosis not present

## 2023-10-05 DIAGNOSIS — F1220 Cannabis dependence, uncomplicated: Secondary | ICD-10-CM | POA: Diagnosis not present

## 2023-10-05 DIAGNOSIS — N911 Secondary amenorrhea: Secondary | ICD-10-CM | POA: Diagnosis not present

## 2023-10-05 DIAGNOSIS — E559 Vitamin D deficiency, unspecified: Secondary | ICD-10-CM | POA: Diagnosis not present

## 2023-10-05 DIAGNOSIS — Z6832 Body mass index (BMI) 32.0-32.9, adult: Secondary | ICD-10-CM | POA: Diagnosis not present

## 2023-10-05 DIAGNOSIS — E785 Hyperlipidemia, unspecified: Secondary | ICD-10-CM | POA: Diagnosis not present

## 2023-10-10 DIAGNOSIS — Z79899 Other long term (current) drug therapy: Secondary | ICD-10-CM | POA: Diagnosis not present

## 2023-10-15 ENCOUNTER — Other Ambulatory Visit: Payer: Self-pay

## 2023-10-15 ENCOUNTER — Encounter: Payer: Self-pay | Admitting: Orthopedic Surgery

## 2023-10-15 ENCOUNTER — Other Ambulatory Visit (INDEPENDENT_AMBULATORY_CARE_PROVIDER_SITE_OTHER): Payer: Self-pay

## 2023-10-15 ENCOUNTER — Ambulatory Visit (INDEPENDENT_AMBULATORY_CARE_PROVIDER_SITE_OTHER): Admitting: Orthopedic Surgery

## 2023-10-15 DIAGNOSIS — M170 Bilateral primary osteoarthritis of knee: Secondary | ICD-10-CM | POA: Diagnosis not present

## 2023-10-15 DIAGNOSIS — M1711 Unilateral primary osteoarthritis, right knee: Secondary | ICD-10-CM

## 2023-10-15 DIAGNOSIS — G8929 Other chronic pain: Secondary | ICD-10-CM

## 2023-10-15 DIAGNOSIS — M1712 Unilateral primary osteoarthritis, left knee: Secondary | ICD-10-CM

## 2023-10-15 MED ORDER — MELOXICAM 7.5 MG PO TABS: 7.5000 mg | ORAL_TABLET | Freq: Every day | ORAL | 5 refills | Status: DC

## 2023-10-15 MED ORDER — METHYLPREDNISOLONE ACETATE 40 MG/ML IJ SUSP: 40.0000 mg | Freq: Once | INTRAMUSCULAR | Status: AC

## 2023-10-15 NOTE — Addendum Note (Signed)
 Addended byArla Lab on: 10/15/2023 12:18 PM   Modules accepted: Orders

## 2023-10-15 NOTE — Progress Notes (Signed)
   There were no vitals taken for this visit.  There is no height or weight on file to calculate BMI.  Chief Complaint  Patient presents with   Knee Pain    Both     No diagnosis found.  DOI/DOS/ Date: ongoing

## 2023-10-15 NOTE — Progress Notes (Signed)
 Established Patient Office Visit  Subjective   Patient ID: Rebecca Boyd, female    DOB: [birth year]  Age: 48 y.o. MRN: [id number]  Chief Complaint  Patient presents with   Knee Pain    Both     48 year old female bilateral knee pain right greater than left  She had some swelling in the knee took some ibuprofen  the swelling went down  She notes that the knees do not look right  She has lateral pain on the right  She also has bilateral medial pain  Knee Pain  There was no injury mechanism. The pain is present in the left knee and right knee. The quality of the pain is described as aching. The pain is severe. The pain has been Intermittent since onset. Associated symptoms comments: Swelling improved with ibuprofen . She has tried NSAIDs for the symptoms. The treatment provided moderate relief.      ROS history of lumbar spine condition    Objective:     There were no vitals taken for this visit. BP Readings from Last 3 Encounters:  10/01/23 (!) 143/88  09/20/23 (!) 147/92  08/15/23 (!) 145/100      Physical Exam Vitals and nursing note reviewed.  Constitutional:      Appearance: Normal appearance.  HENT:     Head: Normocephalic and atraumatic.   Eyes:     General: No scleral icterus.       Right eye: No discharge.        Left eye: No discharge.     Extraocular Movements: Extraocular movements intact.     Conjunctiva/sclera: Conjunctivae normal.     Pupils: Pupils are equal, round, and reactive to light.    Cardiovascular:     Rate and Rhythm: Normal rate.     Pulses: Normal pulses.   Musculoskeletal:     Right knee: No swelling, deformity, effusion, erythema, ecchymosis, lacerations or crepitus. Normal range of motion. Tenderness present over the medial joint line and lateral joint line. No LCL laxity, MCL laxity, ACL laxity or PCL laxity. Normal alignment, normal meniscus and normal patellar mobility. Normal pulse.     Left knee: No swelling,  deformity, effusion, erythema, ecchymosis, lacerations or crepitus. Normal range of motion. Tenderness present over the medial joint line. No LCL laxity, MCL laxity, ACL laxity or PCL laxity.Normal alignment, normal meniscus and normal patellar mobility. Normal pulse.     Comments: Both knees hyperextend when the patient is in the standing position   Skin:    General: Skin is warm and dry.     Capillary Refill: Capillary refill takes less than 2 seconds.   Neurological:     General: No focal deficit present.     Mental Status: She is alert and oriented to person, place, and time.     Gait: Gait normal.   Psychiatric:        Mood and Affect: Mood normal.        Behavior: Behavior normal.        Thought Content: Thought content normal.        Judgment: Judgment normal.     No results found for any visits on 10/15/23.  Last CBC Lab Results  Component Value Date   WBC 4.9 09/20/2023   HGB 14.6 09/20/2023   HCT 41.0 09/20/2023   MCV 87.4 09/20/2023   MCH 31.1 09/20/2023   RDW 12.8 09/20/2023   PLT 196 09/20/2023      The 10-year ASCVD risk score (Arnett  DK, et al., 2019) is: 5%    Assessment & Plan:   Problem List Items Addressed This Visit   None Visit Diagnoses       Primary osteoarthritis of right knee    -  Primary   Relevant Medications   meloxicam  (MOBIC ) 7.5 MG tablet     Chronic pain of right knee       Relevant Orders   DG Knee AP/LAT W/Sun[place] Right     Chronic pain of left knee       Relevant Medications   meloxicam  (MOBIC ) 7.5 MG tablet   Other Relevant Orders   DG Knee AP/LAT W/[place] Left     Primary osteoarthritis of left knee       Relevant Medications   meloxicam  (MOBIC ) 7.5 MG tablet      No results found.   Return if symptoms worsen or fail to improve, for OOW TODAY .   Probable OA both knees with some patella tilt  Symptoms and findings consistent with osteoarthritis  We discussed injection and medication therapy and she opted  both   Procedure note for bilateral knee injections  Procedure note left knee injection verbal consent was obtained to inject left knee joint  Timeout was completed to confirm the site of injection  The medications used were 40 mg depomedrol and 3 cc of 1% lidocaine   Anesthesia was provided by ethyl chloride and the skin was prepped with alcohol.  After cleaning the skin with alcohol a 20-gauge needle was used to inject the left knee joint. There were no complications. A sterile bandage was applied.   Procedure note right knee injection verbal consent was obtained to inject right knee joint  Timeout was completed to confirm the site of injection  The medications used were 40 mg depomedrol and 3 cc of 1% lidocaine   Anesthesia was provided by ethyl chloride and the skin was prepped with alcohol.  After cleaning the skin with alcohol a 20-gauge needle was used to inject the right knee joint. There were no complications. A sterile bandage was applied.   Meds ordered this encounter  Medications   meloxicam  (MOBIC ) 7.5 MG tablet    Sig: Take 1 tablet (7.5 mg total) by mouth daily.    Dispense:  30 tablet    Refill:  5   Return as needed Elsa Halls, MD

## 2023-11-01 ENCOUNTER — Encounter: Payer: Self-pay | Admitting: Family Medicine

## 2023-11-05 MED ORDER — BETAMETHASONE DIPROPIONATE 0.05 % EX CREA: TOPICAL_CREAM | Freq: Two times a day (BID) | CUTANEOUS | 2 refills | Status: AC

## 2023-11-15 ENCOUNTER — Encounter: Payer: Self-pay | Admitting: Family Medicine

## 2023-11-15 ENCOUNTER — Other Ambulatory Visit: Payer: Self-pay

## 2023-11-15 MED ORDER — FLUCONAZOLE 150 MG PO TABS: 150.0000 mg | ORAL_TABLET | Freq: Once | ORAL | 0 refills | Status: AC

## 2023-11-23 ENCOUNTER — Telehealth: Payer: Self-pay | Admitting: Orthopedic Surgery

## 2023-11-23 NOTE — Telephone Encounter (Signed)
 Patient is requesting intermittent leave. She submitted a form that she already filled out. I advised her that I need a blank  form that patients can't complete their own form. Please advise if okay for intermittent leave. Thank you.

## 2023-11-23 NOTE — Telephone Encounter (Signed)
 Rebecca Boyd

## 2023-11-26 DIAGNOSIS — Z79899 Other long term (current) drug therapy: Secondary | ICD-10-CM | POA: Diagnosis not present

## 2023-11-26 DIAGNOSIS — E559 Vitamin D deficiency, unspecified: Secondary | ICD-10-CM | POA: Diagnosis not present

## 2023-11-26 DIAGNOSIS — E785 Hyperlipidemia, unspecified: Secondary | ICD-10-CM | POA: Diagnosis not present

## 2023-11-26 DIAGNOSIS — E6609 Other obesity due to excess calories: Secondary | ICD-10-CM | POA: Diagnosis not present

## 2023-11-26 DIAGNOSIS — N911 Secondary amenorrhea: Secondary | ICD-10-CM | POA: Diagnosis not present

## 2023-11-26 DIAGNOSIS — M47816 Spondylosis without myelopathy or radiculopathy, lumbar region: Secondary | ICD-10-CM | POA: Diagnosis not present

## 2023-11-26 DIAGNOSIS — F1220 Cannabis dependence, uncomplicated: Secondary | ICD-10-CM | POA: Diagnosis not present

## 2023-11-26 DIAGNOSIS — Z6832 Body mass index (BMI) 32.0-32.9, adult: Secondary | ICD-10-CM | POA: Diagnosis not present

## 2023-11-26 NOTE — Telephone Encounter (Signed)
 Noted. I will complete form once I receive from the patient. She is aware to provide a new form.

## 2023-11-29 ENCOUNTER — Other Ambulatory Visit: Payer: Self-pay | Admitting: Family Medicine

## 2023-11-29 DIAGNOSIS — Z79899 Other long term (current) drug therapy: Secondary | ICD-10-CM | POA: Diagnosis not present

## 2023-12-06 ENCOUNTER — Ambulatory Visit (INDEPENDENT_AMBULATORY_CARE_PROVIDER_SITE_OTHER): Admitting: Family Medicine

## 2023-12-06 ENCOUNTER — Encounter: Payer: Self-pay | Admitting: Family Medicine

## 2023-12-06 VITALS — BP 118/81 | HR 92 | Resp 18 | Ht 60.0 in | Wt 165.0 lb

## 2023-12-06 DIAGNOSIS — R195 Other fecal abnormalities: Secondary | ICD-10-CM

## 2023-12-06 DIAGNOSIS — I10 Essential (primary) hypertension: Secondary | ICD-10-CM

## 2023-12-06 DIAGNOSIS — R634 Abnormal weight loss: Secondary | ICD-10-CM

## 2023-12-06 DIAGNOSIS — R1013 Epigastric pain: Secondary | ICD-10-CM

## 2023-12-06 DIAGNOSIS — F331 Major depressive disorder, recurrent, moderate: Secondary | ICD-10-CM

## 2023-12-06 DIAGNOSIS — R110 Nausea: Secondary | ICD-10-CM | POA: Diagnosis not present

## 2023-12-06 DIAGNOSIS — F411 Generalized anxiety disorder: Secondary | ICD-10-CM

## 2023-12-06 MED ORDER — PANTOPRAZOLE SODIUM 40 MG PO TBEC: 40.0000 mg | DELAYED_RELEASE_TABLET | Freq: Every day | ORAL | 1 refills | Status: DC

## 2023-12-06 MED ORDER — ONDANSETRON HCL 4 MG/2ML IJ SOLN
4.0000 mg | Freq: Once | INTRAMUSCULAR | Status: AC
  Administered 2023-12-06: 4 mg via INTRAMUSCULAR

## 2023-12-06 MED ORDER — BUSPIRONE HCL 5 MG PO TABS: 5.0000 mg | ORAL_TABLET | Freq: Two times a day (BID) | ORAL | 3 refills | Status: AC

## 2023-12-06 MED ORDER — ESCITALOPRAM OXALATE 10 MG PO TABS: 10.0000 mg | ORAL_TABLET | Freq: Every day | ORAL | 3 refills | Status: AC

## 2023-12-06 MED ORDER — ALBUTEROL SULFATE HFA 108 (90 BASE) MCG/ACT IN AERS: 1.0000 | INHALATION_SPRAY | RESPIRATORY_TRACT | 3 refills | Status: AC | PRN

## 2023-12-06 MED ORDER — ONDANSETRON HCL 4 MG PO TABS: 4.0000 mg | ORAL_TABLET | Freq: Three times a day (TID) | ORAL | 0 refills | Status: DC | PRN

## 2023-12-06 NOTE — Patient Instructions (Addendum)
 Follow-up in 4 months.  New for depression and anxiety is Lexapro  10 mg 1 daily and BuSpar  5 mg twice daily.  You are also referred to psychiatry and need to resume therapy again  For abdominal pain with nausea I am referring you back to your GI specialist Dr. Evertt who did do upper endoscopy on you 1 year ago.    You will have test at the lab today.    I have prescribed Zofran  short-term for nausea as well as Protonix .  Zofran  4 mg IM for nausea is administered i  cBC, Cmp and EGFr, amylase, ;lipase and h pylori breath test today please  No antibiotic for mild swelling around left eye, continue compresses as before  Thanks for choosing [facility], we consider it a privelige to serve you.

## 2023-12-07 ENCOUNTER — Ambulatory Visit: Payer: Self-pay | Admitting: Family Medicine

## 2023-12-08 ENCOUNTER — Encounter: Payer: Self-pay | Admitting: Family Medicine

## 2023-12-08 LAB — CMP14+EGFR
ALT: 13 IU/L (ref 0–32)
AST: 16 IU/L (ref 0–40)
Albumin: 4.2 g/dL (ref 3.9–4.9)
Alkaline Phosphatase: 102 IU/L (ref 44–121)
BUN/Creatinine Ratio: 13 (ref 9–23)
BUN: 10 mg/dL (ref 6–24)
Bilirubin Total: 0.2 mg/dL (ref 0.0–1.2)
CO2: 22 mmol/L (ref 20–29)
Calcium: 9.7 mg/dL (ref 8.7–10.2)
Chloride: 105 mmol/L (ref 96–106)
Creatinine, Ser: 0.77 mg/dL (ref 0.57–1.00)
Globulin, Total: 2.6 g/dL (ref 1.5–4.5)
Glucose: 91 mg/dL (ref 70–99)
Potassium: 3.7 mmol/L (ref 3.5–5.2)
Sodium: 144 mmol/L (ref 134–144)
Total Protein: 6.8 g/dL (ref 6.0–8.5)
eGFR: 96 mL/min/1.73 (ref >59)

## 2023-12-08 LAB — CBC WITH DIFFERENTIAL/PLATELET
Basophils Absolute: 0 x10E3/uL (ref 0.0–0.2)
Basos: 0 %
EOS (ABSOLUTE): 0.1 x10E3/uL (ref 0.0–0.4)
Eos: 2 %
Hematocrit: 44.3 % (ref 34.0–46.6)
Hemoglobin: 14.4 g/dL (ref 11.1–15.9)
Immature Grans (Abs): 0 x10E3/uL (ref 0.0–0.1)
Immature Granulocytes: 0 %
Lymphocytes Absolute: 2.2 x10E3/uL (ref 0.7–3.1)
Lymphs: 28 %
MCH: 31 pg (ref 26.6–33.0)
MCHC: 32.5 g/dL (ref 31.5–35.7)
MCV: 96 fL (ref 79–97)
Monocytes Absolute: 0.5 x10E3/uL (ref 0.1–0.9)
Monocytes: 6 %
Neutrophils Absolute: 5 x10E3/uL (ref 1.4–7.0)
Neutrophils: 63 %
Platelets: 223 x10E3/uL (ref 150–450)
RBC: 4.64 x10E6/uL (ref 3.77–5.28)
RDW: 12.9 % (ref 11.7–15.4)
WBC: 7.9 x10E3/uL (ref 3.4–10.8)

## 2023-12-08 LAB — LIPASE: Lipase: 25 U/L (ref 14–72)

## 2023-12-08 LAB — H. PYLORI BREATH TEST: H pylori Breath Test: NEGATIVE

## 2023-12-08 LAB — AMYLASE: Amylase: 103 U/L (ref 31–110)

## 2023-12-10 ENCOUNTER — Encounter: Payer: Self-pay | Admitting: Gastroenterology

## 2023-12-13 ENCOUNTER — Ambulatory Visit: Admitting: Orthopedic Surgery

## 2023-12-13 DIAGNOSIS — R195 Other fecal abnormalities: Secondary | ICD-10-CM | POA: Insufficient documentation

## 2023-12-13 DIAGNOSIS — R110 Nausea: Secondary | ICD-10-CM | POA: Insufficient documentation

## 2023-12-13 DIAGNOSIS — R634 Abnormal weight loss: Secondary | ICD-10-CM | POA: Insufficient documentation

## 2023-12-13 DIAGNOSIS — R1013 Epigastric pain: Secondary | ICD-10-CM | POA: Insufficient documentation

## 2023-12-13 NOTE — Assessment & Plan Note (Signed)
 Start buspar  and refer to psych asap

## 2023-12-13 NOTE — Progress Notes (Unsigned)
   Rebecca Boyd     MRN: [id number]      DOB: 22-Feb-1976  Chief Complaint  Patient presents with   Abdominal Pain    Pt complains of stomach discomfort right under ribs, also has diarrhea, nausea, and loss of appetite x3 weeks    Anxiety    Pt complains of increase in anxiety for past few months     HPI Rebecca Boyd is here with above complaints ROS Denies recent fever or chills. Denies sinus pressure, nasal congestion, ear pain or sore throat. Denies chest congestion, productive cough or wheezing. Denies chest pains, palpitations and leg swelling    Denies dysuria, frequency, hesitancy or incontinence. Denies joint pain, swelling and limitation in mobility. Denies headaches, seizures, numbness, or tingling. Denies skin break down or rash.   PE  BP 118/81   Pulse 92   Resp 18   Ht 5' (1.524 m)   Wt 165 lb (74.8 kg)   SpO2 98%   BMI 32.22 kg/m   Patient alert and oriented and in no cardiopulmonary distress.  HEENT: No facial asymmetry, EOMI,     Neck supple .  Chest: Clear to auscultation bilaterally.  CVS: S1, S2 no murmurs, no S3.Regular rate.  ABD: Soft non tender.   Ext: No edema  MS: Adequate ROM spine, shoulders, hips and knees.  Skin: Intact, no ulcerations or rash noted.  Psych: Good eye contact, normal affect. Memory intact not anxious or depressed appearing.  CNS: CN 2-12 intact, power,  normal throughout.no focal deficits noted.   Assessment & Plan  No problem-specific Assessment & Plan notes found for this encounter.

## 2023-12-13 NOTE — Assessment & Plan Note (Signed)
 Not suicidal or homicidal, needs to re establish with psych, start lexapro  and refer for soonest available appt

## 2023-12-13 NOTE — Assessment & Plan Note (Signed)
 10 pound weight loss due to uncontrolled gI symptoms rerf

## 2023-12-17 ENCOUNTER — Encounter: Payer: Self-pay | Admitting: Family Medicine

## 2023-12-17 NOTE — Assessment & Plan Note (Signed)
 Increased and uncontrolled pain, x weeksrefer to GI, check H pylori

## 2023-12-17 NOTE — Assessment & Plan Note (Signed)
 Controlled, no change in medication DASH diet and commitment to daily physical activity for a minimum of 30 minutes discussed and encouraged, as a part of hypertension management. The importance of attaining a healthy weight is also discussed.     12/06/2023    1:01 PM 10/01/2023    9:06 AM 09/20/2023    2:30 AM 09/20/2023    2:00 AM 09/20/2023    1:39 AM 09/19/2023   11:41 PM 09/19/2023   11:40 PM  BP/Weight  Systolic BP 118 143 147 137 140 142   Diastolic BP 81 88 92 97 98 97   Wt. (Lbs) 165      176.15  BMI 32.22 kg/m2      34.4 kg/m2

## 2023-12-17 NOTE — Assessment & Plan Note (Addendum)
 Zofran  4 mg Im in office folowed  by tabs as needed, also GI eval

## 2023-12-19 DIAGNOSIS — E6609 Other obesity due to excess calories: Secondary | ICD-10-CM | POA: Diagnosis not present

## 2023-12-19 DIAGNOSIS — N911 Secondary amenorrhea: Secondary | ICD-10-CM | POA: Diagnosis not present

## 2023-12-19 DIAGNOSIS — I10 Essential (primary) hypertension: Secondary | ICD-10-CM | POA: Diagnosis not present

## 2023-12-19 DIAGNOSIS — M47816 Spondylosis without myelopathy or radiculopathy, lumbar region: Secondary | ICD-10-CM | POA: Diagnosis not present

## 2023-12-19 DIAGNOSIS — Z6832 Body mass index (BMI) 32.0-32.9, adult: Secondary | ICD-10-CM | POA: Diagnosis not present

## 2023-12-19 DIAGNOSIS — Z79899 Other long term (current) drug therapy: Secondary | ICD-10-CM | POA: Diagnosis not present

## 2023-12-19 DIAGNOSIS — F1220 Cannabis dependence, uncomplicated: Secondary | ICD-10-CM | POA: Diagnosis not present

## 2023-12-28 DIAGNOSIS — Z79899 Other long term (current) drug therapy: Secondary | ICD-10-CM | POA: Diagnosis not present

## 2024-01-02 ENCOUNTER — Ambulatory Visit: Payer: Self-pay | Admitting: Family Medicine

## 2024-01-07 ENCOUNTER — Encounter: Payer: Self-pay | Admitting: Orthopedic Surgery

## 2024-01-07 ENCOUNTER — Ambulatory Visit (INDEPENDENT_AMBULATORY_CARE_PROVIDER_SITE_OTHER): Admitting: Orthopedic Surgery

## 2024-01-07 DIAGNOSIS — M25562 Pain in left knee: Secondary | ICD-10-CM | POA: Diagnosis not present

## 2024-01-07 DIAGNOSIS — G8929 Other chronic pain: Secondary | ICD-10-CM | POA: Diagnosis not present

## 2024-01-07 DIAGNOSIS — M47816 Spondylosis without myelopathy or radiculopathy, lumbar region: Secondary | ICD-10-CM | POA: Diagnosis not present

## 2024-01-07 DIAGNOSIS — M25561 Pain in right knee: Secondary | ICD-10-CM

## 2024-01-07 MED ORDER — GABAPENTIN 300 MG PO CAPS: 300.0000 mg | ORAL_CAPSULE | Freq: Every day | ORAL | 1 refills | Status: AC

## 2024-01-07 MED ORDER — PREDNISONE 10 MG (48) PO TBPK: ORAL_TABLET | Freq: Every day | ORAL | 0 refills | Status: DC

## 2024-01-07 MED ORDER — METHYLPREDNISOLONE ACETATE 40 MG/ML IJ SUSP: 40.0000 mg | Freq: Once | INTRAMUSCULAR | Status: DC

## 2024-01-07 MED ORDER — METHYLPREDNISOLONE ACETATE 40 MG/ML IJ SUSP
40.0000 mg | Freq: Once | INTRAMUSCULAR | Status: AC
  Administered 2024-01-07: 40 mg via INTRA_ARTICULAR

## 2024-01-07 MED ORDER — TIZANIDINE HCL 4 MG PO TABS: 4.0000 mg | ORAL_TABLET | Freq: Every day | ORAL | 1 refills | Status: AC

## 2024-01-07 NOTE — Addendum Note (Signed)
 Addended byBETHA JENEAN GREIG LELON on: 01/07/2024 11:22 AM   Modules accepted: Orders

## 2024-01-07 NOTE — Patient Instructions (Addendum)
 OOW NOTE TODAY    You have received an injection of steroids into the joint. 15% of patients will have increased pain within the 24 hours postinjection.   This is transient and will go away.   We recommend that you use ice packs on the injection site for 20 minutes every 2 hours and extra strength Tylenol  2 tablets every 8 as needed until the pain resolves.  If you continue to have pain after taking the Tylenol  and using the ice please call the office for further instructions.  While we are working on your approval for MRI please go ahead and call to schedule your appointment with [facility] within at least one (1) week.   [phone]

## 2024-01-07 NOTE — Progress Notes (Signed)
   There were no vitals taken for this visit.  There is no height or weight on file to calculate BMI.  Chief Complaint  Patient presents with   Knee Pain    RIGHT KNEE OA    No diagnosis found.  DOI/DOS/ Date: 01/07/24  Worse

## 2024-01-07 NOTE — Addendum Note (Signed)
 Addended byBETHA JENEAN GREIG LELON on: 01/07/2024 11:16 AM   Modules accepted: Orders

## 2024-01-07 NOTE — Addendum Note (Signed)
 Addended byBETHA JENEAN GREIG LELON on: 01/07/2024 11:52 AM   Modules accepted: Orders

## 2024-01-07 NOTE — Progress Notes (Signed)
   Chief Complaint  Patient presents with   Knee Pain    RIGHT KNEE OA    48 year old female with diagnosis of mild OA right knee comes in with continued pain right knee diagnosis unclear pain out of proportion to findings on physical exam or x-ray  Lumbar spine has been worked up and only mild disease sending there as well  Complaints Right greater than left knee pain pain runs down both legs both legs starting to have more tingling   Reexamination Global lumbar spine tenderness midline right and left side  Mild crepitance on knee range of motion hyperextension both knees Mild tenderness around the right knee no effusion similar on the left side Straight leg raise pain on the right no radicular symptoms no pain on the left Chane both hips normal  Recommend MRI right knee rule out intra-articular pathology Inject both knees  Start prednisone   Robaxin  or tizanidine   Gabapentin   Meds ordered this encounter  Medications   predniSONE  (STERAPRED UNI-PAK 48 TAB) 10 MG (48) TBPK tablet    Sig: Take by mouth daily.    Dispense:  48 tablet    Refill:  0   gabapentin  (NEURONTIN ) 300 MG capsule    Sig: Take 1 capsule (300 mg total) by mouth at bedtime.    Dispense:  30 capsule    Refill:  1   tiZANidine  (ZANAFLEX ) 4 MG tablet    Sig: Take 1 tablet (4 mg total) by mouth daily.    Dispense:  30 tablet    Refill:  1   Procedure note for bilateral knee injections  Procedure note left knee injection verbal consent was obtained to inject left knee joint  Timeout was completed to confirm the site of injection  The medications used were 40 mg depomedrol and 3 cc of 1% lidocaine   Anesthesia was provided by ethyl chloride and the skin was prepped with alcohol .  After cleaning the skin with alcohol  a 20-gauge needle was used to inject the left knee joint. There were no complications. A sterile bandage was applied.   Procedure note right knee injection verbal consent was obtained  to inject right knee joint  Timeout was completed to confirm the site of injection  The medications used were 40 mg depomedrol and 3 cc of 1% lidocaine   Anesthesia was provided by ethyl chloride and the skin was prepped with alcohol .  After cleaning the skin with alcohol  a 20-gauge needle was used to inject the right knee joint. There were no complications. A sterile bandage was applied.   I still think it is her back

## 2024-01-08 ENCOUNTER — Emergency Department (HOSPITAL_COMMUNITY)

## 2024-01-08 ENCOUNTER — Encounter: Payer: Self-pay | Admitting: Family Medicine

## 2024-01-08 ENCOUNTER — Emergency Department (HOSPITAL_COMMUNITY)
Admission: EM | Admit: 2024-01-08 | Discharge: 2024-01-08 | Disposition: A | Discharge status: Home / Self Care | Attending: Emergency Medicine | Admitting: Emergency Medicine

## 2024-01-08 ENCOUNTER — Other Ambulatory Visit: Payer: Self-pay

## 2024-01-08 ENCOUNTER — Encounter (HOSPITAL_COMMUNITY): Payer: Self-pay | Admitting: Emergency Medicine

## 2024-01-08 DIAGNOSIS — R519 Headache, unspecified: Secondary | ICD-10-CM | POA: Diagnosis not present

## 2024-01-08 DIAGNOSIS — R0602 Shortness of breath: Secondary | ICD-10-CM | POA: Diagnosis not present

## 2024-01-08 DIAGNOSIS — I10 Essential (primary) hypertension: Secondary | ICD-10-CM | POA: Insufficient documentation

## 2024-01-08 DIAGNOSIS — Z91040 Latex allergy status: Secondary | ICD-10-CM | POA: Insufficient documentation

## 2024-01-08 DIAGNOSIS — R918 Other nonspecific abnormal finding of lung field: Secondary | ICD-10-CM | POA: Diagnosis not present

## 2024-01-08 DIAGNOSIS — J189 Pneumonia, unspecified organism: Secondary | ICD-10-CM | POA: Diagnosis not present

## 2024-01-08 DIAGNOSIS — J45901 Unspecified asthma with (acute) exacerbation: Secondary | ICD-10-CM | POA: Insufficient documentation

## 2024-01-08 DIAGNOSIS — R109 Unspecified abdominal pain: Secondary | ICD-10-CM | POA: Diagnosis not present

## 2024-01-08 DIAGNOSIS — Z7951 Long term (current) use of inhaled steroids: Secondary | ICD-10-CM | POA: Diagnosis not present

## 2024-01-08 DIAGNOSIS — Z79899 Other long term (current) drug therapy: Secondary | ICD-10-CM | POA: Diagnosis not present

## 2024-01-08 DIAGNOSIS — J181 Lobar pneumonia, unspecified organism: Secondary | ICD-10-CM | POA: Insufficient documentation

## 2024-01-08 DIAGNOSIS — J45909 Unspecified asthma, uncomplicated: Secondary | ICD-10-CM | POA: Diagnosis not present

## 2024-01-08 LAB — BASIC METABOLIC PANEL WITH GFR
Anion gap: 12 (ref 5–15)
BUN: 8 mg/dL (ref 6–20)
CO2: 20 mmol/L — ABNORMAL LOW (ref 22–32)
Calcium: 9.6 mg/dL (ref 8.9–10.3)
Chloride: 105 mmol/L (ref 98–111)
Creatinine, Ser: 0.72 mg/dL (ref 0.44–1.00)
GFR, Estimated: >60 mL/min (ref >60)
Glucose, Bld: 120 mg/dL — ABNORMAL HIGH (ref 70–99)
Potassium: 3.2 mmol/L — ABNORMAL LOW (ref 3.5–5.1)
Sodium: 137 mmol/L (ref 135–145)

## 2024-01-08 LAB — BLOOD GAS, VENOUS
Acid-Base Excess: 3.1 mmol/L — ABNORMAL HIGH (ref 0.0–2.0)
Bicarbonate: 24.8 mmol/L (ref 20.0–28.0)
Drawn by: 4237
O2 Saturation: 48.9 %
Patient temperature: 36.7
pCO2, Ven: 29 mmHg — ABNORMAL LOW (ref 44–60)
pH, Ven: 7.55 — ABNORMAL HIGH (ref 7.25–7.43)
pO2, Ven: <31 mmHg — CL (ref 32–45)

## 2024-01-08 LAB — D-DIMER, QUANTITATIVE: D-Dimer, Quant: 0.44 ug{FEU}/mL (ref 0.00–0.50)

## 2024-01-08 LAB — RESP PANEL BY RT-PCR (RSV, FLU A&B, COVID)  RVPGX2
Influenza A by PCR: NEGATIVE
Influenza B by PCR: NEGATIVE
Resp Syncytial Virus by PCR: NEGATIVE
SARS Coronavirus 2 by RT PCR: NEGATIVE

## 2024-01-08 LAB — BRAIN NATRIURETIC PEPTIDE: B Natriuretic Peptide: 23 pg/mL (ref 0.0–100.0)

## 2024-01-08 LAB — TROPONIN I (HIGH SENSITIVITY): Troponin I (High Sensitivity): <2 ng/L (ref <18)

## 2024-01-08 MED ORDER — AMOXICILLIN 500 MG PO TABS: 1000.0000 mg | ORAL_TABLET | Freq: Three times a day (TID) | ORAL | 0 refills | Status: AC

## 2024-01-08 MED ORDER — ACETAMINOPHEN 325 MG PO TABS
650.0000 mg | ORAL_TABLET | Freq: Once | ORAL | Status: AC
  Administered 2024-01-08: 650 mg via ORAL
  Filled 2024-01-08: qty 2

## 2024-01-08 MED ORDER — SODIUM CHLORIDE 0.9 % IV SOLN
1.0000 g | Freq: Once | INTRAVENOUS | Status: AC
  Administered 2024-01-08: 1 g via INTRAVENOUS
  Filled 2024-01-08: qty 10

## 2024-01-08 MED ORDER — IPRATROPIUM-ALBUTEROL 0.5-2.5 (3) MG/3ML IN SOLN
3.0000 mL | Freq: Once | RESPIRATORY_TRACT | Status: AC
  Administered 2024-01-08: 3 mL via RESPIRATORY_TRACT
  Filled 2024-01-08: qty 3

## 2024-01-08 MED ORDER — DOXYCYCLINE HYCLATE 100 MG PO TABS
100.0000 mg | ORAL_TABLET | Freq: Once | ORAL | Status: AC
  Administered 2024-01-08: 100 mg via ORAL
  Filled 2024-01-08: qty 1

## 2024-01-08 MED ORDER — METHYLPREDNISOLONE SODIUM SUCC 125 MG IJ SOLR
125.0000 mg | Freq: Once | INTRAMUSCULAR | Status: AC
Start: 2024-01-08 — End: 2024-01-08
  Administered 2024-01-08: 125 mg via INTRAVENOUS
  Filled 2024-01-08: qty 2

## 2024-01-08 MED ORDER — PREDNISONE 10 MG PO TABS: 50.0000 mg | ORAL_TABLET | Freq: Every day | ORAL | 0 refills | Status: DC

## 2024-01-08 MED ORDER — SODIUM CHLORIDE 0.9 % IV BOLUS
1000.0000 mL | Freq: Once | INTRAVENOUS | Status: AC
  Administered 2024-01-08: 1000 mL via INTRAVENOUS

## 2024-01-08 MED ORDER — ONDANSETRON HCL 4 MG/2ML IJ SOLN
4.0000 mg | Freq: Once | INTRAMUSCULAR | Status: AC
Start: 2024-01-08 — End: 2024-01-08
  Administered 2024-01-08: 4 mg via INTRAVENOUS
  Filled 2024-01-08: qty 2

## 2024-01-08 MED ORDER — DOXYCYCLINE HYCLATE 100 MG PO CAPS: 100.0000 mg | ORAL_CAPSULE | Freq: Two times a day (BID) | ORAL | 0 refills | Status: DC

## 2024-01-08 NOTE — ED Triage Notes (Signed)
 Pt c/o SOB, headache, nausea, and abd pain since last night, hx of asthma, used inhaler and neb treatment with no relief

## 2024-01-08 NOTE — Discharge Instructions (Signed)
 You were seen in the emergency room for shortness of breath and cough.  X-ray of the chest does show findings that are concerning for pneumonia.  Your COVID-19, flu test is negative.  We are glad that breathing treatments helped you.  We suspect that there is an element of asthma exacerbation as well.  Please take the antibiotics that are prescribed.  Use inhaler every 4-6 hours for shortness of breath.  Take the steroids that is prescribed.  Return to the emergency room if your symptoms get worse.

## 2024-01-08 NOTE — ED Provider Notes (Signed)
 [facility] AT [facility] Provider Note   CSN: [id number] Arrival date & time: 01/08/24  9198     Patient presents with: Shortness of Breath   Rebecca Boyd is a 48 y.o. female.   HPI     48 year old female comes in with chief complaint of shortness of breath.  Patient has history of asthma, hyperlipidemia, elevated BMI.  Patient indicates that she started feeling slightly unwell yesterday.  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  She has cough, that is not productive.  She is having some wheezing.  Patient did take inhalers during her shift without significant relief.  She has history of double pneumonia, and had shortness of breath like this at that time.  She denies any history of PE, DVT.  Patient has no cardiac disease history.  Prior to Admission medications   Medication Sig Start Date End Date Taking? Authorizing Provider  albuterol  (VENTOLIN  HFA) 108 (90 Base) MCG/ACT inhaler Inhale 1-2 puffs into the lungs every 4 (four) hours as needed for wheezing or shortness of breath. 12/06/23  Yes Antonetta Rollene BRAVO, MD  amLODipine  (NORVASC ) 10 MG tablet Take 1 tablet (10 mg total) by mouth daily. 11/29/23  Yes Antonetta Rollene BRAVO, MD  amoxicillin  (AMOXIL ) 500 MG tablet Take 2 tablets (1,000 mg total) by mouth in the morning, at noon, and at bedtime for 7 days. 01/08/24 01/15/24 Yes Bonna Steury, MD  beclomethasone (QVAR  REDIHALER) 80 MCG/ACT inhaler Inhale 2 puffs into the lungs 2 (two) times daily. 09/04/22  Yes Iva Marty Saltness, MD  betamethasone  dipropionate 0.05 % cream Apply topically 2 (two) times daily. 11/05/23  Yes Antonetta Rollene BRAVO, MD  busPIRone  (BUSPAR ) 5 MG tablet Take 1 tablet (5 mg total) by mouth 2 (two) times daily. 12/06/23  Yes Antonetta Rollene BRAVO, MD  cyclobenzaprine  (FLEXERIL ) 10 MG tablet Take 1 tablet (10 mg total) by mouth at bedtime. One tablet every night at bedtime as  needed for spasm. 09/17/23  Yes Margrette Taft BRAVO, MD  diclofenac  (VOLTAREN ) 75 MG EC tablet Take 75 mg by mouth 2 (two) times daily. 12/26/22  Yes [provider]  doxycycline  (VIBRAMYCIN ) 100 MG capsule Take 1 capsule (100 mg total) by mouth 2 (two) times daily. 01/08/24  Yes Charlyn Sora, MD  EPINEPHrine  0.3 mg/0.3 mL IJ SOAJ injection Inject 0.3 mg into the muscle as needed for anaphylaxis. 08/01/22  Yes Antonetta Rollene BRAVO, MD  Erenumab -aooe (AIMOVIG ) 70 MG/ML SOAJ Inject 70 mg into the skin every 30 (thirty) days. Patient taking differently: Inject 70 mg into the skin See admin instructions. Inject once month as needed for migraine reoccurrence. 08/12/19  Yes Gayland Lauraine PARAS, NP  escitalopram  (LEXAPRO ) 10 MG tablet Take 1 tablet (10 mg total) by mouth daily. 12/06/23  Yes Antonetta Rollene BRAVO, MD  fluticasone  (FLONASE ) 50 MCG/ACT nasal spray Place 2 sprays into both nostrils daily. 08/01/22  Yes Antonetta Rollene BRAVO, MD  gabapentin  (NEURONTIN ) 300 MG capsule Take 1 capsule (300 mg total) by mouth at bedtime. 01/07/24  Yes Margrette Taft BRAVO, MD  metroNIDAZOLE  (METROGEL ) 0.75 % vaginal gel Place 1 Applicatorful vaginally 2 (two) times daily. 04/09/23  Yes Antonetta Rollene BRAVO, MD  montelukast  (SINGULAIR ) 10 MG tablet Take 1 tablet (10 mg total) by mouth at bedtime. 08/02/23  Yes Antonetta Rollene BRAVO, MD  ondansetron  (ZOFRAN ) 4 MG tablet Take 1 tablet (4 mg total) by mouth every 8 (eight) hours  as needed for nausea or vomiting. 12/06/23  Yes Antonetta Rollene BRAVO, MD  oxyCODONE -acetaminophen  (PERCOCET/ROXICET) 5-325 MG tablet Take 1 tablet by mouth 2 (two) times daily. 12/04/22  Yes [provider]  pantoprazole  (PROTONIX ) 40 MG tablet Take 1 tablet (40 mg total) by mouth daily. 12/06/23  Yes Antonetta Rollene BRAVO, MD  predniSONE  (DELTASONE ) 10 MG tablet Take 5 tablets (50 mg total) by mouth daily. 01/08/24  Yes Charlyn Sora, MD  SUMAtriptan  (IMITREX ) 6 MG/0.5ML SOLN injection Use one injection at onset  of migraine.  May repeat in 2 hrs, if needed.  Max dose: 2 inj/day. This is a 30 day prescription. 08/12/19  Yes Gayland Lauraine PARAS, NP  tiZANidine  (ZANAFLEX ) 4 MG tablet Take 1 tablet (4 mg total) by mouth daily. Patient not taking: Reported on 01/08/2024 01/07/24 01/06/25  Margrette Taft BRAVO, MD    Allergies: Diphenhydramine hcl, Orange fruit, and Latex    Review of Systems  All other systems reviewed and are negative.   Updated Vital Signs BP (!) 127/94   Pulse 88   Temp 99.1 F (37.3 C) (Oral)   Resp (!) 27   Ht 5' (1.524 m)   Wt 74.8 kg   SpO2 96%   BMI 32.22 kg/m   Physical Exam Vitals and nursing note reviewed.  Constitutional:      Appearance: She is well-developed.  HENT:     Head: Atraumatic.  Cardiovascular:     Rate and Rhythm: Normal rate.  Pulmonary:     Effort: Pulmonary effort is normal.     Breath sounds: No decreased breath sounds, wheezing, rhonchi or rales.  Musculoskeletal:     Cervical back: Normal range of motion and neck supple.     Right lower leg: No tenderness. No edema.     Left lower leg: No tenderness. No edema.  Skin:    General: Skin is warm and dry.  Neurological:     Mental Status: She is alert and oriented to person, place, and time.     (all labs ordered are listed, but only abnormal results are displayed) Labs Reviewed  BASIC METABOLIC PANEL WITH GFR - Abnormal; Notable for the following components:      Result Value   Potassium 3.2 (*)    CO2 20 (*)    Glucose, Bld 120 (*)    All other components within normal limits  BLOOD GAS, VENOUS - Abnormal; Notable for the following components:   pH, Ven 7.55 (*)    pCO2, Ven 29 (*)    pO2, Ven <31 (*)    Acid-Base Excess 3.1 (*)    All other components within normal limits  RESP PANEL BY RT-PCR (RSV, FLU A&B, COVID)  [id number]  BRAIN NATRIURETIC PEPTIDE  D-DIMER, QUANTITATIVE  TROPONIN I (HIGH SENSITIVITY)    EKG: EKG Interpretation Date/Time:  Tuesday January 08 2024 09:55:59  EDT Ventricular Rate:  83 PR Interval:  151 QRS Duration:  77 QT Interval:  395 QTC Calculation: 465 R Axis:   17  Text Interpretation: Sinus rhythm No acute changes No significant change since last tracing Confirmed by Charlyn Sora ([id number]) on 01/08/2024 10:18:24 AM  Radiology: [facility] Chest Port 1 View Result Date: 01/08/2024 EXAM: 1 VIEW XRAY OF THE CHEST 01/08/2024 08:25:00 AM COMPARISON: 04/05/2023 CLINICAL HISTORY: Pt c/o SOB, headache, nausea, and abd pain since last night, hx of asthma, used inhaler and neb treatment with no relief. FINDINGS: LUNGS AND PLEURA: New patchy airspace opacity in left lower lung. No  pulmonary edema. No pleural effusion. No pneumothorax. HEART AND MEDIASTINUM: No acute abnormality of the cardiac and mediastinal silhouettes. BONES AND SOFT TISSUES: No acute osseous abnormality. IMPRESSION: 1. New patchy airspace opacity in the left lower lung. This may reflect atelectasis or pneumonia. Electronically signed by: Waddell Calk MD 01/08/2024 08:32 AM EDT RP Workstation: [id number]     Procedures   Medications Ordered in the ED  ipratropium-albuterol  (DUONEB) 0.5-2.5 (3) MG/3ML nebulizer solution 3 mL (3 mLs Nebulization Given 01/08/24 0852)  ondansetron  (ZOFRAN ) injection 4 mg (4 mg Intravenous Given 01/08/24 0919)  sodium chloride  0.9 % bolus 1,000 mL (0 mLs Intravenous Stopped 01/08/24 1133)  cefTRIAXone  (ROCEPHIN ) 1 g in sodium chloride  0.9 % 100 mL IVPB (0 g Intravenous Stopped 01/08/24 1127)  doxycycline  (VIBRA -TABS) tablet 100 mg (100 mg Oral Given 01/08/24 1100)  ipratropium-albuterol  (DUONEB) 0.5-2.5 (3) MG/3ML nebulizer solution 3 mL (3 mLs Nebulization Given 01/08/24 1053)  methylPREDNISolone  sodium succinate (SOLU-MEDROL ) 125 mg/2 mL injection 125 mg (125 mg Intravenous Given 01/08/24 1057)  acetaminophen  (TYLENOL ) tablet 650 mg (650 mg Oral Given 01/08/24 1136)    Clinical Course as of 01/08/24 1223  Tue Jan 08, 2024  1006 DG Chest Port 1 View Questionable  pneumonia on chest x-ray.  I have independently interpreted chest x-ray, agree with the radiologist finding. [AN]    Clinical Course User Index [AN] Charlyn Sora, MD                                 Medical Decision Making Amount and/or Complexity of Data Reviewed Labs: ordered. Radiology: ordered. Decision-making details documented in ED Course.  Risk OTC drugs. Prescription drug management.   48 year old patient comes in with chief complaint of shortness of breath.  Patient has past medical history of hypertension, hyperlipidemia  On exam, patient noted to be tachypneic, and in respiratory distress.  However there is no hypoxia, patient has no wheezing.  Differential diagnosis considered for this patient includes pulmonary embolism, acute coronary syndrome, asthma exacerbation, pneumonia, anxiety attack.  Plan is to get basic labs and then we will reassess the patient.  I will give her DuoNeb. Patient denies any history of anxiety -I will get VBG, we will reassess the patient closely.  Will give her something for anxiety, if tachypnea noted.   Reassessment: Patient reassessed after VBG.  Patient noted to have respiratory alkalosis, likely because of hyperventilation.  She however feels a lot better after receiving DuoNeb.  Patient is not tachypneic right now.  Will not give her anything for anxiety given significant improvement in her breathing.  Reassessment: Patient continues to feel better.  She was open to getting additional breathing treatment, which was given.  IV antibiotics completed. The patient appears reasonably screened and/or stabilized for discharge and I doubt any other medical condition or other [facility] requiring further screening, evaluation, or treatment in the ED at this time prior to discharge.   Results from the ER workup discussed with the patient face to face and all questions answered to the best of my ability.  Strict ER return precautions have been  discussed, and patient is agreeing with the plan and is comfortable with the workup done and the recommendations from the ER.    Final diagnoses:  Community acquired pneumonia of left lower lobe of lung  Asthma with acute exacerbation, unspecified asthma severity, unspecified whether persistent    ED Discharge Orders  Ordered    amoxicillin  (AMOXIL ) 500 MG tablet  3 times daily        01/08/24 1221    doxycycline  (VIBRAMYCIN ) 100 MG capsule  2 times daily        01/08/24 1221    predniSONE  (DELTASONE ) 10 MG tablet  Daily        01/08/24 1221               Charlyn Sora, MD 01/08/24 1224

## 2024-01-10 ENCOUNTER — Telehealth: Payer: Self-pay | Admitting: Family Medicine

## 2024-01-10 ENCOUNTER — Ambulatory Visit: Payer: Self-pay | Admitting: Family Medicine

## 2024-01-10 ENCOUNTER — Encounter: Payer: Self-pay | Admitting: Family Medicine

## 2024-01-10 VITALS — BP 146/93 | HR 82 | Ht 60.0 in | Wt 161.0 lb

## 2024-01-10 DIAGNOSIS — J189 Pneumonia, unspecified organism: Secondary | ICD-10-CM

## 2024-01-10 NOTE — Progress Notes (Signed)
 Established Patient Office Visit  Subjective:  Patient ID: Rebecca Boyd, female    DOB: [birth year]  Age: 48 y.o. MRN: [id number]  CC:  Chief Complaint  Patient presents with   Follow-up    ER follow up for pneumonia     HPI Rebecca Boyd is a 48 y.o. female  presents for follow-up of community-acquired pneumonia. She was seen and treated on 01/08/2024 for pneumonia of the left lower lobe and discharged home on amoxicillin , doxycycline , and prednisone . She reports that she still feels slightly unwell, with ongoing cough and shortness of breath. She started treatment on 01/08/2024.  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   Initial dx is 05/2013    Hypertension    Migraine    Migraines    Miscarriage 2009   Neck pain    Nondisplaced fracture of fifth metatarsal bone, left foot, subsequent encounter for fracture with routine healing 08/15/18 09/25/2018   Pruritus 09/29/2018   Pruritus ani 09/25/2018   Rash and nonspecific skin eruption 05/14/2017   Vaginitis    cyctitis    Past Surgical History:  Procedure Laterality Date   BIOPSY  07/07/2022   Procedure: BIOPSY;  Surgeon: Cindie Carlin POUR, DO;  [facility]: [facility];  Service: Endoscopy;;   cervical cryotherapy N/A 1999   COLONOSCOPY WITH PROPOFOL  N/A 05/10/2021   Procedure: COLONOSCOPY WITH PROPOFOL ;  Surgeon: Cindie Carlin POUR, DO;   Location: [facility];  Service: Endoscopy;  Laterality: N/A;  11:30am   DILITATION & CURRETTAGE/HYSTROSCOPY WITH THERMACHOICE ABLATION  08/26/2013   Procedure: DILATATION & CURETTAGE/HYSTEROSCOPY WITH THERMACHOICE ENDOMETRIAL ABLATION Procedure #2 Total Therapy Time=min       sec;  Surgeon: Norleen LULLA Server, MD;  Location: [facility];  Service: Gynecology;;   ESOPHAGOGASTRODUODENOSCOPY (EGD) WITH PROPOFOL  N/A 07/07/2022   Procedure: ESOPHAGOGASTRODUODENOSCOPY (EGD) WITH PROPOFOL ;  Surgeon: Cindie Carlin POUR, DO;  [facility]: [facility];  Service: Endoscopy;  Laterality: N/A;  245pm, asa 2   LAPAROSCOPIC BILATERAL SALPINGECTOMY Bilateral 08/26/2013   Procedure: LAPAROSCOPIC BILATERAL SALPINGECTOMY AND REMOVAL OF LEFT PERITUBAL CYST Procedure #1;  Surgeon: Norleen LULLA Server, MD;  Location: [facility];  Service: Gynecology;  Laterality: Bilateral;   LAPAROSCOPIC LYSIS OF ADHESIONS  08/26/2013   Procedure: LAPAROSCOPIC LYSIS OF ADHESIONS Procedure #1;  Surgeon: Norleen LULLA Server, MD;  Location: [facility];  Service: Gynecology;;   POLYPECTOMY  05/10/2021   Procedure: POLYPECTOMY;  Surgeon: Cindie Carlin POUR, DO;  [facility];  Service: Endoscopy;;   POLYPECTOMY  07/07/2022   Procedure: POLYPECTOMY;  Surgeon: Cindie Carlin POUR, DO;  [facility]: [facility];  Service: Endoscopy;;   VULVAR LESION REMOVAL  08/26/2013   Procedure: REMOVAL OF VULVAR SEBACEOUS CYST Procedure #3;  Surgeon: Norleen LULLA Server, MD;  Location: [facility];  Service:  Gynecology;;    Family History  Problem Relation Age of Onset   Depression Mother    Drug abuse Mother    COPD Mother    Hypertension Mother    Hypertension Father    Breast cancer Sister        dx 67   Colon cancer Maternal Aunt        76   Hypertension Maternal Grandmother    Hypertension Maternal Grandfather    Lung cancer Maternal Grandfather    Hypertension Paternal Grandmother    Breast cancer Paternal Grandmother        dx late 25s   Colon cancer Cousin 88        maternal   Miscarriages / [place] Daughter     Social History   Socioeconomic History   Marital status: Single    Spouse name: Not on file   Number of children: Not on file   Years of education: Not on file   Highest education level: Associate degree: occupational, Scientist, product/process development, or vocational program  Occupational History   Not on file  Tobacco Use   Smoking status: Former    Current packs/day: 0.00    Types: Cigars, Cigarettes    Quit date: 01/06/2016    Years since quitting: 8.0   Smokeless tobacco: Never  Vaping Use   Vaping status: Never Used  Substance and Sexual Activity   Alcohol  use: Yes    Comment: 1 beer at a time and frequently at social gatherings   Drug use: Not Currently    Comment: See psychiatry note from 10/04/2022   Sexual activity: Yes    Birth control/protection: Condom  Other Topics Concern   Not on file  Social History Narrative   Lives home with mother.  Works at [employer].  Education 10th grade/GED.  No children.  Single.     Social Drivers of Health   Financial Resource Strain: Medium Risk (01/06/2024)   Overall Financial Resource Strain (CARDIA)    Difficulty of Paying Living Expenses: Somewhat hard  Food Insecurity: Food Insecurity Present (01/06/2024)   Hunger Vital Sign    Worried About Running Out of Food in the Last Year: Sometimes true    Ran Out of Food in the Last Year: Sometimes true  Transportation Needs: Unmet Transportation Needs (01/06/2024)   PRAPARE - Administrator, Civil Service (Medical): No    Lack of Transportation (Non-Medical): Yes  Physical Activity: Insufficiently Active (01/06/2024)   Exercise Vital Sign    Days of Exercise per Week: 2 days    Minutes of Exercise per Session: 60 min  Stress: Stress Concern Present (01/06/2024)   [employer] of Occupational Health - Occupational Stress Questionnaire    Feeling of Stress: Very much  Social Connections: Moderately Isolated (01/06/2024)   Social  Connection and Isolation Panel    Frequency of Communication with Friends and Family: Twice a week    Frequency of Social Gatherings with Friends and Family: Once a week    Attends Religious Services: 1 to 4 times per year    Active Member of [employer] or Organizations: No    Attends Engineer, structural: Not on file    Marital Status: Never married  Intimate Partner Violence: Not on file    Outpatient Medications Prior to Visit  Medication Sig Dispense Refill   albuterol  (VENTOLIN  HFA) 108 (90 Base) MCG/ACT inhaler Inhale 1-2 puffs into the lungs every 4 (four) hours as needed for wheezing or shortness  of breath. 18 g 3   amLODipine  (NORVASC ) 10 MG tablet Take 1 tablet (10 mg total) by mouth daily. 90 tablet 2   amoxicillin  (AMOXIL ) 500 MG tablet Take 2 tablets (1,000 mg total) by mouth in the morning, at noon, and at bedtime for 7 days. 42 tablet 0   beclomethasone (QVAR  REDIHALER) 80 MCG/ACT inhaler Inhale 2 puffs into the lungs 2 (two) times daily. 1 each 5   betamethasone  dipropionate 0.05 % cream Apply topically 2 (two) times daily. 45 g 2   busPIRone  (BUSPAR ) 5 MG tablet Take 1 tablet (5 mg total) by mouth 2 (two) times daily. 60 tablet 3   cyclobenzaprine  (FLEXERIL ) 10 MG tablet Take 1 tablet (10 mg total) by mouth at bedtime. One tablet every night at bedtime as needed for spasm. 30 tablet 0   diclofenac  (VOLTAREN ) 75 MG EC tablet Take 75 mg by mouth 2 (two) times daily.     doxycycline  (VIBRAMYCIN ) 100 MG capsule Take 1 capsule (100 mg total) by mouth 2 (two) times daily. 14 capsule 0   EPINEPHrine  0.3 mg/0.3 mL IJ SOAJ injection Inject 0.3 mg into the muscle as needed for anaphylaxis. 1 each 1   Erenumab -aooe (AIMOVIG ) 70 MG/ML SOAJ Inject 70 mg into the skin every 30 (thirty) days. (Patient taking differently: Inject 70 mg into the skin See admin instructions. Inject once month as needed for migraine reoccurrence.) 1 pen 11   escitalopram  (LEXAPRO ) 10 MG tablet Take 1 tablet  (10 mg total) by mouth daily. 30 tablet 3   fluticasone  (FLONASE ) 50 MCG/ACT nasal spray Place 2 sprays into both nostrils daily. 16 g 6   gabapentin  (NEURONTIN ) 300 MG capsule Take 1 capsule (300 mg total) by mouth at bedtime. 30 capsule 1   metroNIDAZOLE  (METROGEL ) 0.75 % vaginal gel Place 1 Applicatorful vaginally 2 (two) times daily. 70 g 2   montelukast  (SINGULAIR ) 10 MG tablet Take 1 tablet (10 mg total) by mouth at bedtime. 90 tablet 3   ondansetron  (ZOFRAN ) 4 MG tablet Take 1 tablet (4 mg total) by mouth every 8 (eight) hours as needed for nausea or vomiting. 20 tablet 0   oxyCODONE -acetaminophen  (PERCOCET/ROXICET) 5-325 MG tablet Take 1 tablet by mouth 2 (two) times daily.     pantoprazole  (PROTONIX ) 40 MG tablet Take 1 tablet (40 mg total) by mouth daily. 30 tablet 1   predniSONE  (DELTASONE ) 10 MG tablet Take 5 tablets (50 mg total) by mouth daily. 25 tablet 0   SUMAtriptan  (IMITREX ) 6 MG/0.5ML SOLN injection Use one injection at onset of migraine.  May repeat in 2 hrs, if needed.  Max dose: 2 inj/day. This is a 30 day prescription. 5 mL 11   tiZANidine  (ZANAFLEX ) 4 MG tablet Take 1 tablet (4 mg total) by mouth daily. (Patient not taking: Reported on 01/08/2024) 30 tablet 1   No facility-administered medications prior to visit.    Allergies  Allergen Reactions   Diphenhydramine Hcl Anaphylaxis    (Benadryl)   Orange Fruit Hives, Shortness Of Breath, Itching and Other (See Comments)    MAKES IT DIFFICULT TO BREATH   Latex Rash    ROS Review of Systems  Constitutional:  Positive for fatigue. Negative for chills.  Eyes:  Negative for visual disturbance.  Respiratory:  Positive for cough and shortness of breath. Negative for chest tightness.   Neurological:  Negative for dizziness and headaches.      Objective:    Physical Exam HENT:     Head:  Normocephalic.     Mouth/Throat:     Mouth: Mucous membranes are moist.  Cardiovascular:     Rate and Rhythm: Normal rate.      Heart sounds: Normal heart sounds.  Pulmonary:     Effort: Pulmonary effort is normal.     Breath sounds: Normal breath sounds.  Neurological:     Mental Status: She is alert.     BP (!) 146/93   Pulse 82   Ht 5' (1.524 m)   Wt 161 lb (73 kg)   SpO2 97%   BMI 31.44 kg/m  Wt Readings from Last 3 Encounters:  01/10/24 161 lb (73 kg)  01/08/24 165 lb (74.8 kg)  12/06/23 165 lb (74.8 kg)    Lab Results  Component Value Date   TSH 1.010 08/02/2023   Lab Results  Component Value Date   WBC 7.9 12/06/2023   HGB 14.4 12/06/2023   HCT 44.3 12/06/2023   MCV 96 12/06/2023   PLT 223 12/06/2023   Lab Results  Component Value Date   NA 137 01/08/2024   K 3.2 (L) 01/08/2024   CO2 20 (L) 01/08/2024   GLUCOSE 120 (H) 01/08/2024   BUN 8 01/08/2024   CREATININE 0.72 01/08/2024   BILITOT 0.2 12/06/2023   ALKPHOS 102 12/06/2023   AST 16 12/06/2023   ALT 13 12/06/2023   PROT 6.8 12/06/2023   ALBUMIN 4.2 12/06/2023   CALCIUM 9.6 01/08/2024   ANIONGAP 12 01/08/2024   EGFR 96 12/06/2023   Lab Results  Component Value Date   CHOL 189 08/02/2023   Lab Results  Component Value Date   HDL 50 08/02/2023   Lab Results  Component Value Date   LDLCALC 114 (H) 08/02/2023   Lab Results  Component Value Date   TRIG 139 08/02/2023   Lab Results  Component Value Date   CHOLHDL 3.8 08/02/2023   Lab Results  Component Value Date   HGBA1C 5.4 08/01/2022      Assessment & Plan:  Community acquired pneumonia of left lower lobe of lung Assessment & Plan: -Encouraged to complete the full course of antibiotics as prescribed. -Encouraged to stop by [facility] on February 21, 2024 for a chest X-ray to assess for pneumonia clearance.   Please monitor for the following symptoms and seek care if they occur: -Worsening cough, shortness of breath, or chest pain -Persistent or recurrent fever -Chills, sweats, or fatigue that worsens instead of improving -Coughing up blood  or increased sputum production -Dizziness, confusion, or fainting  Orders: -     DG Chest 2 View -     BMP8+EGFR -     CBC with Differential/Platelet   Note: This chart has been completed using Engineer, civil (consulting) software, and while attempts have been made to ensure accuracy, certain words and phrases may not be transcribed as intended.   Follow-up: No follow-ups on file.   Laureen Frederic, FNP

## 2024-01-10 NOTE — Patient Instructions (Addendum)
 I appreciate the opportunity to provide care to you today!  -Please stop by [facility] on February 21, 2024 for a chest X-ray to assess for pneumonia clearance. -Complete the full course of antibiotics as prescribed.  Please monitor for the following symptoms and seek care if they occur: -Worsening cough, shortness of breath, or chest pain -Persistent or recurrent fever -Chills, sweats, or fatigue that worsens instead of improving -Coughing up blood or increased sputum production -Dizziness, confusion, or fainting   Please follow up if your symptoms worsen or fail to improve.    Please continue to a heart-healthy diet and increase your physical activities. Try to exercise for at least five days a week.    It was a pleasure to see you and I look forward to continuing to work together on your health and well-being. Please do not hesitate to call the office if you need care or have questions about your care.  In case of emergency, please visit the Emergency Department for urgent care, or contact our clinic at [phone] to schedule an appointment. We're here to help you!   Have a wonderful day and week. With Gratitude, Hunt Zajicek MSN, FNP-BC

## 2024-01-10 NOTE — Assessment & Plan Note (Signed)
-  Encouraged to complete the full course of antibiotics as prescribed. -Encouraged to stop by [facility] on February 21, 2024 for a chest X-ray to assess for pneumonia clearance.   Please monitor for the following symptoms and seek care if they occur: -Worsening cough, shortness of breath, or chest pain -Persistent or recurrent fever -Chills, sweats, or fatigue that worsens instead of improving -Coughing up blood or increased sputum production -Dizziness, confusion, or fainting

## 2024-01-11 ENCOUNTER — Telehealth: Payer: Self-pay | Admitting: Family Medicine

## 2024-01-11 ENCOUNTER — Ambulatory Visit: Payer: Self-pay | Admitting: Family Medicine

## 2024-01-11 NOTE — Telephone Encounter (Signed)
 Message sent to patient

## 2024-01-11 NOTE — Telephone Encounter (Signed)
 [employer]ability forms  Noted Copied Sleeved Original placed provider box Copy placed front desk folder

## 2024-01-14 ENCOUNTER — Encounter: Payer: Self-pay | Admitting: Family Medicine

## 2024-01-14 NOTE — Telephone Encounter (Signed)
 Put in Rebecca Boyd's box to complete in place of [place]. Rebecca Boyd seen her for ER follow up

## 2024-01-15 ENCOUNTER — Encounter: Payer: Self-pay | Admitting: Family Medicine

## 2024-01-15 NOTE — Telephone Encounter (Unsigned)
 Copied from CRM [id number]. Topic: General - Other >> Jan 15, 2024 12:23 PM Rebecca Boyd wrote: Reason for CRM: Pt  was triaged by the nurses for:  called to report that her symptoms for pneumonia have not improved. She is expected to return to work today however she is unable to.  CRM [phone]  but also needs a doctors note that she can provide today to extend her leave from work

## 2024-01-17 ENCOUNTER — Ambulatory Visit: Admitting: Orthopedic Surgery

## 2024-01-17 DIAGNOSIS — M67961 Unspecified disorder of synovium and tendon, right lower leg: Secondary | ICD-10-CM | POA: Diagnosis not present

## 2024-01-17 DIAGNOSIS — M76891 Other specified enthesopathies of right lower limb, excluding foot: Secondary | ICD-10-CM | POA: Diagnosis not present

## 2024-01-22 ENCOUNTER — Ambulatory Visit (INDEPENDENT_AMBULATORY_CARE_PROVIDER_SITE_OTHER): Admitting: Gastroenterology

## 2024-01-22 ENCOUNTER — Encounter: Payer: Self-pay | Admitting: Gastroenterology

## 2024-01-22 VITALS — BP 132/85 | HR 79 | Temp 97.8°F | Ht 60.0 in | Wt 163.5 lb

## 2024-01-22 DIAGNOSIS — K921 Melena: Secondary | ICD-10-CM

## 2024-01-22 DIAGNOSIS — R110 Nausea: Secondary | ICD-10-CM

## 2024-01-22 DIAGNOSIS — K2970 Gastritis, unspecified, without bleeding: Secondary | ICD-10-CM

## 2024-01-22 DIAGNOSIS — R634 Abnormal weight loss: Secondary | ICD-10-CM | POA: Diagnosis not present

## 2024-01-22 DIAGNOSIS — R1013 Epigastric pain: Secondary | ICD-10-CM | POA: Diagnosis not present

## 2024-01-22 DIAGNOSIS — R140 Abdominal distension (gaseous): Secondary | ICD-10-CM | POA: Diagnosis not present

## 2024-01-22 DIAGNOSIS — K219 Gastro-esophageal reflux disease without esophagitis: Secondary | ICD-10-CM | POA: Diagnosis not present

## 2024-01-22 MED ORDER — OMEPRAZOLE 40 MG PO CPDR: 40.0000 mg | DELAYED_RELEASE_CAPSULE | Freq: Every day | ORAL | 3 refills | Status: AC

## 2024-01-22 NOTE — Progress Notes (Signed)
 GI Office Note    Referring Provider: Antonetta Rollene BRAVO, MD Primary Care Physician:  Antonetta Rollene BRAVO, MD Primary Gastroenterologist: Carlin POUR. Cindie, DO  Date:  01/22/2024  ID:  [name], DOB 1975-11-08, MRN [id number]   Chief Complaint   Chief Complaint  Patient presents with   Abdominal Pain    Patient here today due to having epigastric pain ongoing for several months. Per Patient she was started on pantoprazole  40 mg once per day. Patient says this has not changed her issues with the epigastric pain. She does have occasional nausea, which patient has zofran  on hand and takes prn.    History of Present Illness  Rebecca Boyd is a 48 y.o. female with a history of anemia, anxiety, depression, HTN, and migraines presenting today with complaint of epigastric pain and nausea.   OV 02/18/21. Visit to discuss first-ever screening colonoscopy.  Reported maternal cousin with colon cancer age 34, maternal aunt with stage IV colon cancer diagnosed age 60.  Also noted to have a sister with breast cancer diagnosed in her 30s.  Doing well from a GI standpoint denies any bowel concerns.  No melena or BRBPR.  No abdominal pain, weight loss, vomiting, constipation, diarrhea, or heartburn.  Scheduled for colonoscopy.   Colonoscopy 05/10/21: -Non-bleeding internal hemorrhoids -4 polyps of the rectosigmoid colon (hyperplastic) -Repeat colonoscopy in 10 years   Office visit with PCP 06/15/2022.  Patient reported 2-week history of epigastric pain 7/10 and RUQ and RLQ pain that has occasionally awaken patient.  PCP ordered CT A/P as well as H. pylori blood test.  Advised on healthy eating plan with goal to lose weight.  Urine pregnancy test negative.   CT A/P 06/26/2022: -No acute abnormality to explain abdominal pain -Small fat-containing umbilical hernia -Suspected uterine fibroids -Small to moderate volume of stool in the colon without inflammation -Occasional diverticula without  diverticulitis -Normal pancreas, liver, and gallbladder   Labs December 2023 with normal hemoglobin at 12.4.  Last office visit 07/04/22.  Having mid upper abdominal pain for about a month radiating to her chest at times.  Feels as though something stuck there.  When she eats and makes it worse and feels very bloated.  Specific foods not identified.  No dysphagia.  Nausea is occasional.  Has a good appetite and denies any weight loss.  Has not provide shortness of breath with her asthma.  No constipation or diarrhea.  Sometimes with black stools which will be random.  Has migraines.  No dizziness or lightheadedness. EGD ordered.  Advised to start pantoprazole  40 mg once daily.  GERD diet and lifestyle modifications discussed.  Advised to follow-up 2-3 months after procedure.  EGD 07/07/2022: - Gastroesophageal junction polyp (s) were found. Resected and retrieved.  - Gastritis. Biopsied.  - Normal duodenal bulb, first and second portion of the duodenum - Non- severe reflux esophagitis with no bleeding. - Advised pantoprazole  40 mg twice daily for 12 weeks.  CT renal stone study in May 2025 with unremarkable pancreas.  No gallstones or wall thickening.  Normal adrenal glands.  Scattered diverticular change of the colon noted without diverticulitis.  H. pylori breath test negative in August.  Normal amylase and lipase.  Today:  Discussed the use of AI scribe software for clinical note transcription with the patient, who gave verbal consent to proceed.  She experiences persistent nausea that was occurring daily last month but now varies with no specific time of day. She describes a sensation  of heaviness in the abdomen, particularly in the center, which sometimes feels like 'something's sitting here'.  She has experienced significant weight loss; she reports a possible 30 Boyd over the past month and a half. Our scales on documentation are weights from 176 Boyd in May to 163 Boyd currently.  She notes a loss of appetite and difficulty keeping food down, although she can tolerate pizza and [place] foods like spaghetti. She avoids raw vegetables and is allergic to oranges.  Her bowel movements are regular without constipation or diarrhea, though she notes occasional changes in stool color, including dark green, yellowish, and sometimes black. No use of iron supplements or medications like Pepto or Kaopectate. Reports occasional black stool but no mucus or brbpr.   She has a history of using pantoprazole , which provided some relief but did not fully alleviate her symptoms. Reports more of a fullness or bloating sensation to her upper abdomen. No significant vomiting but does have nausea as above. She has not tried increasing the dose of pantoprazole  to twice daily.  She has a known allergy  to oranges as well as an allergy  to Benadryl. She has undergone a prior endoscopy which showed inflammation, but no H. pylori infection was detected. Breath test in August negative as well.       Wt Readings from Last 10 Encounters:  01/22/24 163 lb 8 oz (74.2 kg)  01/10/24 161 lb (73 kg)  01/08/24 165 lb (74.8 kg)  12/06/23 165 lb (74.8 kg)  09/19/23 176 lb 2.4 oz (79.9 kg)  08/02/23 176 lb 1.9 oz (79.9 kg)  06/06/23 171 lb 1.3 oz (77.6 kg)  04/05/23 180 lb (81.6 kg)  02/15/23 183 lb (83 kg)  01/05/23 181 lb 1.9 oz (82.2 kg)    Current Outpatient Medications  Medication Sig Dispense Refill   albuterol  (VENTOLIN  HFA) 108 (90 Base) MCG/ACT inhaler Inhale 1-2 puffs into the lungs every 4 (four) hours as needed for wheezing or shortness of breath. 18 g 3   amLODipine  (NORVASC ) 10 MG tablet Take 1 tablet (10 mg total) by mouth daily. 90 tablet 2   beclomethasone (QVAR  REDIHALER) 80 MCG/ACT inhaler Inhale 2 puffs into the lungs 2 (two) times daily. 1 each 5   betamethasone  dipropionate 0.05 % cream Apply topically 2 (two) times daily. 45 g 2   busPIRone  (BUSPAR ) 5 MG tablet Take 1 tablet (5 mg  total) by mouth 2 (two) times daily. 60 tablet 3   diclofenac  (VOLTAREN ) 75 MG EC tablet Take 75 mg by mouth 2 (two) times daily.     EPINEPHrine  0.3 mg/0.3 mL IJ SOAJ injection Inject 0.3 mg into the muscle as needed for anaphylaxis. 1 each 1   Erenumab -aooe (AIMOVIG ) 70 MG/ML SOAJ Inject 70 mg into the skin every 30 (thirty) days. (Patient taking differently: Inject 70 mg into the skin See admin instructions. Inject once month as needed for migraine reoccurrence.) 1 pen 11   escitalopram  (LEXAPRO ) 10 MG tablet Take 1 tablet (10 mg total) by mouth daily. 30 tablet 3   fluticasone  (FLONASE ) 50 MCG/ACT nasal spray Place 2 sprays into both nostrils daily. 16 g 6   gabapentin  (NEURONTIN ) 300 MG capsule Take 1 capsule (300 mg total) by mouth at bedtime. 30 capsule 1   metroNIDAZOLE  (METROGEL ) 0.75 % vaginal gel Place 1 Applicatorful vaginally 2 (two) times daily. 70 g 2   montelukast  (SINGULAIR ) 10 MG tablet Take 1 tablet (10 mg total) by mouth at bedtime. (Patient taking differently: Take 10  mg by mouth as needed.) 90 tablet 3   ondansetron  (ZOFRAN ) 4 MG tablet Take 1 tablet (4 mg total) by mouth every 8 (eight) hours as needed for nausea or vomiting. 20 tablet 0   oxyCODONE -acetaminophen  (PERCOCET/ROXICET) 5-325 MG tablet Take 1 tablet by mouth 2 (two) times daily.     pantoprazole  (PROTONIX ) 40 MG tablet Take 1 tablet (40 mg total) by mouth daily. 30 tablet 1   SUMAtriptan  (IMITREX ) 6 MG/0.5ML SOLN injection Use one injection at onset of migraine.  May repeat in 2 hrs, if needed.  Max dose: 2 inj/day. This is a 30 day prescription. 5 mL 11   tiZANidine  (ZANAFLEX ) 4 MG tablet Take 1 tablet (4 mg total) by mouth daily. 30 tablet 1   No current facility-administered medications for this visit.    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    Cervical neck pain with evidence of disc disease 10/02/2013   Depressive disorder, not elsewhere classified    Epistaxis 07/09/2019   Family history of breast cancer    Family history of breast cancer in sister 09/25/2018   Family history of colon cancer    Family history of lung cancer    FH: breast cancer in relative when <82 years old 06/10/2018   Dx at age 44   Headache 03/16/2016   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   Initial dx is 05/2013    Hypertension    Migraine    Migraines    Miscarriage 2009   Neck pain    Nondisplaced fracture of fifth metatarsal bone, left foot, subsequent encounter for fracture with routine healing 08/15/18 09/25/2018   Pruritus 09/29/2018   Pruritus ani 09/25/2018   Rash and nonspecific skin eruption 05/14/2017   Vaginitis    cyctitis    Past Surgical History:  Procedure Laterality Date   BIOPSY  07/07/2022   Procedure: BIOPSY;  Surgeon: Cindie Carlin POUR, DO;  [facility];  Service: Endoscopy;;   cervical cryotherapy N/A 1999   COLONOSCOPY WITH PROPOFOL  N/A 05/10/2021   Procedure: COLONOSCOPY WITH PROPOFOL ;  Surgeon: Cindie Carlin POUR, DO;  [facility];  Service: Endoscopy;  Laterality: N/A;  11:30am   DILITATION & CURRETTAGE/HYSTROSCOPY WITH THERMACHOICE ABLATION  08/26/2013   Procedure: DILATATION & CURETTAGE/HYSTEROSCOPY WITH THERMACHOICE ENDOMETRIAL ABLATION Procedure #2 Total Therapy Time=min       sec;  Surgeon: Norleen LULLA Server, MD;  Location: [facility];  Service: Gynecology;;   ESOPHAGOGASTRODUODENOSCOPY (EGD) WITH PROPOFOL  N/A 07/07/2022   Procedure: ESOPHAGOGASTRODUODENOSCOPY (EGD) WITH PROPOFOL ;  Surgeon: Cindie Carlin POUR, DO;  [facility];  Service: Endoscopy;  Laterality: N/A;  245pm, asa 2   LAPAROSCOPIC BILATERAL SALPINGECTOMY Bilateral 08/26/2013   Procedure: LAPAROSCOPIC BILATERAL SALPINGECTOMY AND REMOVAL OF LEFT PERITUBAL CYST Procedure #1;  Surgeon: Norleen LULLA Server, MD;  Location: [facility];  Service:  Gynecology;  Laterality: Bilateral;   LAPAROSCOPIC LYSIS OF ADHESIONS  08/26/2013   Procedure: LAPAROSCOPIC LYSIS OF ADHESIONS Procedure #1;  Surgeon: Norleen LULLA Server, MD;  Location: [facility];  Service: Gynecology;;   POLYPECTOMY  05/10/2021   Procedure: POLYPECTOMY;  Surgeon: Cindie Carlin POUR, DO;  [facility];  Service: Endoscopy;;   POLYPECTOMY  07/07/2022   Procedure: POLYPECTOMY;  Surgeon: Cindie Carlin POUR, DO;  [facility];  Service: Endoscopy;;   VULVAR LESION REMOVAL  08/26/2013   Procedure: REMOVAL OF VULVAR SEBACEOUS CYST Procedure #3;  Surgeon: Norleen LULLA Server, MD;  Location: [facility];  Service: Gynecology;;    Family History  Problem Relation Age of Onset   Depression Mother    Drug abuse Mother    COPD Mother    Hypertension Mother    Hypertension Father    Breast cancer Sister        dx 35   Colon cancer Maternal Aunt        62   Hypertension Maternal Grandmother    Hypertension Maternal Grandfather    Lung cancer Maternal Grandfather    Hypertension Paternal Grandmother    Breast cancer Paternal Grandmother        dx late 24s   Colon cancer Cousin 13       maternal   Miscarriages / [place] Daughter     Allergies as of 01/22/2024 - Review Complete 01/22/2024  Allergen Reaction Noted   Diphenhydramine hcl Anaphylaxis 07/04/2007   Orange fruit Hives, Shortness Of Breath, Itching, and Other (See Comments) 11/24/2010   Latex Rash 11/03/2013    Social History   Socioeconomic History   Marital status: Single    Spouse name: Not on file   Number of children: Not on file   Years of education: Not on file   Highest education level: Associate degree: occupational, Scientist, product/process development, or vocational program  Occupational History   Not on file  Tobacco Use   Smoking status: Former    Current packs/day: 0.00    Types: Cigars, Cigarettes    Quit date: 01/06/2016    Years since quitting: 8.0   Smokeless tobacco: Never  Vaping Use   Vaping status:  Never Used  Substance and Sexual Activity   Alcohol  use: Yes    Comment: 1 beer at a time and frequently at social gatherings   Drug use: Not Currently    Comment: See psychiatry note from 10/04/2022   Sexual activity: Yes    Birth control/protection: Condom  Other Topics Concern   Not on file  Social History Narrative   Lives home with mother.  Works at [employer].  Education 10th grade/GED.  No children.  Single.     Social Drivers of Health   Financial Resource Strain: Medium Risk (01/06/2024)   Overall Financial Resource Strain (CARDIA)    Difficulty of Paying Living Expenses: Somewhat hard  Food Insecurity: Food Insecurity Present (01/06/2024)   Hunger Vital Sign    Worried About Running Out of Food in the Last Year: Sometimes true    Ran Out of Food in the Last Year: Sometimes true  Transportation Needs: Unmet Transportation Needs (01/06/2024)   PRAPARE - Administrator, Civil Service (Medical): No    Lack of Transportation (Non-Medical): Yes  Physical Activity: Insufficiently Active (01/06/2024)   Exercise Vital Sign    Days of Exercise per Week: 2 days    Minutes of Exercise per Session: 60 min  Stress: Stress Concern Present (01/06/2024)   [employer] of Occupational Health - Occupational Stress Questionnaire    Feeling of Stress: Very much  Social Connections: Moderately Isolated (01/06/2024)   Social Connection and Isolation Panel    Frequency of Communication with Friends and Family: Twice a week    Frequency of Social Gatherings with Friends and Family: Once a week    Attends Religious Services: 1 to 4 times per year  Active Member of Clubs or Organizations: No    Attends Banker Meetings: Not on file    Marital Status: Never married     Review of Systems   Gen: Denies fever, chills, anorexia. Denies fatigue, weakness, weight loss.  CV: Denies chest pain, palpitations, syncope, peripheral edema, and claudication. Resp: Denies  dyspnea at rest, cough, wheezing, coughing up blood, and pleurisy. GI: See HPI Derm: Denies rash, itching, dry skin Psych: Denies depression, anxiety, memory loss, confusion. No homicidal or suicidal ideation.  Heme: Denies bruising, bleeding, and enlarged lymph nodes.  Physical Exam   BP 132/85 (BP Location: Right Arm, Patient Position: Sitting, Cuff Size: Normal)   Pulse 79   Temp 97.8 F (36.6 C) (Temporal)   Ht 5' (1.524 m)   Wt 163 lb 8 oz (74.2 kg)   BMI 31.93 kg/m   General:   Alert and oriented. No distress noted. Pleasant and cooperative.  Head:  Normocephalic and atraumatic. Eyes:  Conjuctiva clear without scleral icterus. Mouth:  UTA - mask in place.  Abdomen:  +BS, soft. Mild distention. Ttp to epigastrium mostly. Some ttp to LUQ. No rebound or guarding. No HSM or masses noted. Rectal: deferred Msk:  Symmetrical without gross deformities. Normal posture. Extremities:  Without edema. Neurologic:  Alert and  oriented x4 Psych:  Alert and cooperative. Normal mood and affect.  Assessment & Plan  Rebecca Boyd is a 48 y.o. female presenting today with nausea, epigastric pain, and weight loss.    Chronic gastritis with gastroesophageal reflux symptoms and bloating Chronic gastritis with gastroesophageal reflux symptoms, presenting with a sensation of heaviness in the stomach, occasional sharp pain, and nausea. Previous treatment with pantoprazole  provided minimal relief. Possible increased inflammation in the stomach or small bowel contributing to symptoms. Differential includes gastroparesis and gallbladder dysfunction, though no gallstones were noted on recent CT scan.  Unable to exclude celiac. Tenderness to palpation to the epigastric region today on exam with some mild tenderness to the left upper quadrant. - Discontinue pantoprazole . - Initiate omeprazole  40 mg twice daily, 30 minutes before breakfast and dinner. - Advise to avoid raw vegetables and focus on a  higher protein diet. - GERD diet advised.  - Monitor for improvement in symptoms over the next few months. - For bloating could consider sucraid trial or SIBO testing.   Unintentional weight loss and nausea Unintentional weight loss of approximately 13 Boyd since May, with associated nausea. Nausea varies in frequency and is not associated with specific times or foods. Differential includes gastroparesis, gallbladder dysfunction, and celiac disease. No evidence of diabetes or H. pylori infection. Stool color changes noted, possibly related to dietary intake or bile excretion. - Monitor weight and symptoms at home. - If significant weight loss or inability to keep food down occurs, consider expedited imaging studies such as gastric emptying study or HIDA scan.  Possible celiac disease (under evaluation) Possible celiac disease under evaluation due to symptoms of bloating and fullness. Previous endoscopy did not include small bowel biopsy for celiac assessment. Given bloating and nausea this is within the differential.  - Order celiac serology testing.      Follow up   Follow up 3 months.     Charmaine Melia, MSN, FNP-BC, AGACNP-BC [facility]

## 2024-01-22 NOTE — Patient Instructions (Addendum)
 VISIT SUMMARY:  Today, you were seen for persistent nausea, weight loss, and abdominal discomfort. You have been experiencing nausea that varies in timing, a sensation of heaviness in your abdomen, and significant weight loss over the past month and a half. Your bowel movements are regular, but you have noticed occasional changes in stool color. You have a history of using pantoprazole , which provided minimal relief. We discussed your symptoms and possible causes, and a new treatment plan was created.  YOUR PLAN:  -CHRONIC GASTRITIS WITH GASTROESOPHAGEAL REFLUX SYMPTOMS: Chronic gastritis is a condition where the stomach lining is inflamed, and gastroesophageal reflux involves stomach acid flowing back into the esophagus. You will stop taking pantoprazole  and start taking omeprazole  40 mg twice daily before breakfast and dinner. Avoid raw vegetables and focus on a higher protein diet. We will monitor your symptoms over the next few months to see if there is any improvement.  Follow a GERD diet:  Avoid fried, fatty, greasy, spicy, citrus foods. Avoid caffeine  and carbonated beverages. Avoid chocolate. Try eating 4-6 small meals a day rather than 3 large meals. Do not eat within 3 hours of laying down. Prop head of bed up on wood or bricks to create a 6 inch incline.   -UNINTENTIONAL WEIGHT LOSS AND NAUSEA: Unintentional weight loss and nausea can be caused by various conditions, including issues with stomach emptying or gallbladder function. You should monitor your weight and symptoms at home. If you experience significant weight loss or have trouble keeping food down, we may need to do further imaging studies. Use zofran  as needed for nausea.   May benefit from a low FODMAP diet -you can follow this little bits at a time to see if you can find any food triggers.  I attached handout for you today.  Bland diet - avoid raw veggies (make sure they are cooked soft).   -POSSIBLE CELIAC DISEASE  (UNDER EVALUATION): Celiac disease is an immune reaction to eating gluten, a protein found in wheat, barley, and rye. We will conduct celiac serology testing to determine if this is the cause of your symptoms.   INSTRUCTIONS:  Please monitor your weight and symptoms at home. If you experience significant weight loss or have trouble keeping food down, contact us  immediately. We will also conduct celiac serology testing to further evaluate your symptoms.  We are changing up your acid reflux medication from pantoprazole  to omeprazole  and you will now take this twice daily, 30 minutes prior to first meal and last meal of the day.  Follow up in 3 months.   It was a pleasure to see you today. I want to create trusting relationships with patients. If you receive a survey regarding your visit,  I greatly appreciate you taking time to fill this out on paper or through your MyChart. I value your feedback.  Charmaine Melia, MSN, FNP-BC, AGACNP-BC [facility]

## 2024-01-24 ENCOUNTER — Ambulatory Visit: Payer: Self-pay | Admitting: Gastroenterology

## 2024-01-24 LAB — CELIAC DISEASE PANEL
(tTG) Ab, IgA: <1 U/mL
(tTG) Ab, IgG: <1 U/mL
Gliadin IgA: 2.1 U/mL
Gliadin IgG: <1 U/mL
Immunoglobulin A: 188 mg/dL (ref 47–310)

## 2024-01-29 DIAGNOSIS — E6609 Other obesity due to excess calories: Secondary | ICD-10-CM | POA: Diagnosis not present

## 2024-01-29 DIAGNOSIS — N911 Secondary amenorrhea: Secondary | ICD-10-CM | POA: Diagnosis not present

## 2024-01-29 DIAGNOSIS — E559 Vitamin D deficiency, unspecified: Secondary | ICD-10-CM | POA: Diagnosis not present

## 2024-01-29 DIAGNOSIS — F1220 Cannabis dependence, uncomplicated: Secondary | ICD-10-CM | POA: Diagnosis not present

## 2024-01-29 DIAGNOSIS — E785 Hyperlipidemia, unspecified: Secondary | ICD-10-CM | POA: Diagnosis not present

## 2024-01-29 DIAGNOSIS — M47816 Spondylosis without myelopathy or radiculopathy, lumbar region: Secondary | ICD-10-CM | POA: Diagnosis not present

## 2024-01-29 DIAGNOSIS — Z79899 Other long term (current) drug therapy: Secondary | ICD-10-CM | POA: Diagnosis not present

## 2024-01-29 DIAGNOSIS — Z6832 Body mass index (BMI) 32.0-32.9, adult: Secondary | ICD-10-CM | POA: Diagnosis not present

## 2024-01-29 NOTE — Progress Notes (Unsigned)
    01/31/2024   Chief Complaint  Patient presents with   Results    Review MRI scan knee right     No diagnosis found.  What pharmacy do you use ? ____[facility]_______________________  DOI/DOS/ Date: ongoing  Unchanged       MRI Knee Right WO Contrast  Anatomical Region Laterality Modality  Thigh -- Magnetic Resonance  Knee -- --  Leg -- --   Impression  IMPRESSION: 1. No evidence for internal derangement. 2. Medial gastrocnemius head origin tendinitis. 3. Small Baker's cyst. 4. Mild extensor tendinosis.  Electronically Signed by: Venetia Smalling, MD on 01/21/2024 7:23 AM Narrative  HISTORY:  Chronic pain of right knee  TECHNIQUE:  MRI of the right knee without contrast.  COMPARISON: None.  FINDINGS: Bones: No acute fracture or osteonecrosis. Cartilage: No focal defects. Joint: No joint effusion.  ACL: Intact. PCL: Intact. MCL: Intact. LCL complex: Intact.  Medial meniscus: Intact. Lateral meniscus: Intact.  Extensor mechanism: Intact. Mild tendinosis.  Other: -Minimal Baker's cyst tracking superiorly along the semimembranosus muscle... -Medial gastrocnemius head origin tendinitis. Procedure Note  Smalling Venetia, MD - 01/21/2024 Formatting of this note might be different from the original. HISTORY:  Chronic pain of right knee  TECHNIQUE:  MRI of the right knee without contrast.  COMPARISON: None.  FINDINGS: Bones: No acute fracture or osteonecrosis. Cartilage: No focal defects. Joint: No joint effusion.  ACL: Intact. PCL: Intact. MCL: Intact. LCL complex: Intact.  Medial meniscus: Intact. Lateral meniscus: Intact.  Extensor mechanism: Intact. Mild tendinosis.  Other: -Minimal Baker's cyst tracking superiorly along the semimembranosus muscle... -Medial gastrocnemius head origin tendinitis.   IMPRESSION: 1. No evidence for internal derangement. 2. Medial gastrocnemius head origin tendinitis. 3. Small Baker's cyst. 4.  Mild extensor tendinosis.  Electronically Signed by: Venetia Smalling, MD on 01/21/2024 7:23 AM Resulting Agency Comment  [id number] Exam End: 01/17/24 13:33   Specimen Collected: 01/21/24 07:16 Last Resulted: 01/21/24 07:23  Received From: [facility]  Result Received: 01/21/24 08:39

## 2024-01-31 ENCOUNTER — Ambulatory Visit (INDEPENDENT_AMBULATORY_CARE_PROVIDER_SITE_OTHER): Admitting: Orthopedic Surgery

## 2024-01-31 ENCOUNTER — Encounter: Payer: Self-pay | Admitting: Orthopedic Surgery

## 2024-01-31 DIAGNOSIS — M47816 Spondylosis without myelopathy or radiculopathy, lumbar region: Secondary | ICD-10-CM

## 2024-01-31 NOTE — Progress Notes (Signed)
 Patient: Rebecca Boyd           Date of Birth: 1975-09-20           MRN: [id number] Visit Date: 01/31/2024 Requested by: Rebecca Rollene BRAVO, MD [address], [address] [place],  [place] [address] PCP: Rebecca Rollene BRAVO, MD   Chief Complaint  Patient presents with   Results    Review MRI scan knee right    Encounter Diagnosis  Name Primary?   Facet arthritis of lumbar region Yes    Plan:  Rebecca Boyd seems to have done well with 2 injections although her MRI came back without any arthritic changes or intra-articular pathology  She is continues to complain of the bilateral leg pain and tingling running from the hip to the foot She has received 1 ESI I recommended if the symptoms continue that she get a second  My interpretation of the MRI is that the intra-articular structures are normal     Chief Complaint  Patient presents with   Results    Review MRI scan knee right    Allergies  Allergen Reactions   Diphenhydramine Hcl Anaphylaxis    (Benadryl)   Orange Fruit Hives, Shortness Of Breath, Itching and Other (See Comments)    MAKES IT DIFFICULT TO BREATH   Latex Rash

## 2024-02-04 DIAGNOSIS — Z79899 Other long term (current) drug therapy: Secondary | ICD-10-CM | POA: Diagnosis not present

## 2024-02-10 ENCOUNTER — Other Ambulatory Visit: Payer: Self-pay | Admitting: Family Medicine

## 2024-02-15 ENCOUNTER — Ambulatory Visit: Admitting: Orthopedic Surgery

## 2024-02-22 ENCOUNTER — Ambulatory Visit: Admitting: Internal Medicine

## 2024-02-22 ENCOUNTER — Other Ambulatory Visit (HOSPITAL_COMMUNITY): Payer: Self-pay

## 2024-02-22 ENCOUNTER — Encounter: Payer: Self-pay | Admitting: Internal Medicine

## 2024-02-22 DIAGNOSIS — J4541 Moderate persistent asthma with (acute) exacerbation: Secondary | ICD-10-CM | POA: Diagnosis not present

## 2024-02-22 MED ORDER — AIRSUPRA 90-80 MCG/ACT IN AERO: 2.0000 | INHALATION_SPRAY | Freq: Four times a day (QID) | RESPIRATORY_TRACT | 1 refills | Status: AC | PRN

## 2024-02-22 MED ORDER — METHYLPREDNISOLONE ACETATE 80 MG/ML IJ SUSP
80.0000 mg | Freq: Once | INTRAMUSCULAR | Status: AC
  Administered 2024-02-22: 80 mg via INTRAMUSCULAR

## 2024-02-22 MED ORDER — PROMETHAZINE-DM 6.25-15 MG/5ML PO SYRP: 5.0000 mL | ORAL_SOLUTION | Freq: Four times a day (QID) | ORAL | 0 refills | Status: AC | PRN

## 2024-02-22 MED ORDER — AZITHROMYCIN 250 MG PO TABS: ORAL_TABLET | ORAL | 0 refills | Status: AC

## 2024-02-22 MED ORDER — PREDNISONE 20 MG PO TABS: ORAL_TABLET | ORAL | 0 refills | Status: AC

## 2024-02-22 NOTE — Progress Notes (Signed)
 Acute Office Visit  Subjective:    Patient ID: Rebecca Boyd, female    DOB: [birth year], 48 y.o.   MRN: [id number]  Chief Complaint  Patient presents with   Cough    Had pneumonia felt better for 2 weeks, has congestion in her chest from cough.     HPI Patient is in today for complaint of recent worsening of cough, dyspnea and wheezing for the last 2 weeks.  Denies any fever or chills currently.  She had an ER visit in 09/25, and was told of pneumonia at that time.  She was treated with amoxicillin  and doxycycline .  She had felt improvement after the treatment.  She has history of asthma.  She currently uses albuterol  inhaler as needed for dyspnea or wheezing.  She used to have Qvar  as maintenance inhaler, but apparently has not had it recently.  She is currently followed by pulmonology-Dr. Shellia.  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   Initial dx is 05/2013    Hypertension    Migraine    Migraines    Miscarriage 2009   Neck pain    Nondisplaced fracture of fifth metatarsal bone, left foot, subsequent encounter for fracture with routine healing 08/15/18 09/25/2018   Pruritus 09/29/2018   Pruritus ani 09/25/2018   Rash and nonspecific skin eruption 05/14/2017   Vaginitis    cyctitis    Past Surgical History:  Procedure Laterality Date   BIOPSY  07/07/2022   Procedure: BIOPSY;  Surgeon:  Cindie Carlin POUR, DO;  Location: [facility];  Service: Endoscopy;;   cervical cryotherapy N/A 1999   COLONOSCOPY WITH PROPOFOL  N/A 05/10/2021   Procedure: COLONOSCOPY WITH PROPOFOL ;  Surgeon: Cindie Carlin POUR, DO;  Location: [facility];  Service: Endoscopy;  Laterality: N/A;  11:30am   DILITATION & CURRETTAGE/HYSTROSCOPY WITH THERMACHOICE ABLATION  08/26/2013   Procedure: DILATATION & CURETTAGE/HYSTEROSCOPY WITH THERMACHOICE ENDOMETRIAL ABLATION Procedure #2 Total Therapy Time=min       sec;  Surgeon: Norleen LULLA Server, MD;  Location: [facility];  Service: Gynecology;;   ESOPHAGOGASTRODUODENOSCOPY (EGD) WITH PROPOFOL  N/A 07/07/2022   Procedure: ESOPHAGOGASTRODUODENOSCOPY (EGD) WITH PROPOFOL ;  Surgeon: Cindie Carlin POUR, DO;  Location: [facility];  Service: Endoscopy;  Laterality: N/A;  245pm, asa 2   LAPAROSCOPIC BILATERAL SALPINGECTOMY Bilateral 08/26/2013   Procedure: LAPAROSCOPIC BILATERAL SALPINGECTOMY AND REMOVAL OF LEFT PERITUBAL CYST Procedure #1;  Surgeon: Norleen LULLA Server, MD;  Location: [facility];  Service: Gynecology;  Laterality: Bilateral;   LAPAROSCOPIC LYSIS OF ADHESIONS  08/26/2013   Procedure: LAPAROSCOPIC LYSIS OF ADHESIONS Procedure #1;  Surgeon: Norleen LULLA Server, MD;  Location: [facility];  Service: Gynecology;;   POLYPECTOMY  05/10/2021   Procedure: POLYPECTOMY;  Surgeon: Cindie Carlin POUR, DO;  [facility]: [facility];  Service: Endoscopy;;  POLYPECTOMY  07/07/2022   Procedure: POLYPECTOMY;  Surgeon: Cindie Carlin POUR, DO;  [facility]: [facility];  Service: Endoscopy;;   VULVAR LESION REMOVAL  08/26/2013   Procedure: REMOVAL OF VULVAR SEBACEOUS CYST Procedure #3;  Surgeon: Norleen LULLA Server, MD;  Location: [facility];  Service: Gynecology;;    Family History  Problem Relation Age of Onset   Depression Mother    Drug abuse Mother    COPD Mother    Hypertension Mother    Hypertension Father    Breast cancer Sister        dx 56   Colon cancer Maternal Aunt        62   Hypertension  Maternal Grandmother    Hypertension Maternal Grandfather    Lung cancer Maternal Grandfather    Hypertension Paternal Grandmother    Breast cancer Paternal Grandmother        dx late 40s   Colon cancer Cousin 20       maternal   Miscarriages / [place] Daughter     Social History   Socioeconomic History   Marital status: Single    Spouse name: Not on file   Number of children: Not on file   Years of education: Not on file   Highest education level: Associate degree: occupational, Scientist, product/process development, or vocational program  Occupational History   Not on file  Tobacco Use   Smoking status: Former    Current packs/day: 0.00    Types: Cigars, Cigarettes    Quit date: 01/06/2016    Years since quitting: 8.1   Smokeless tobacco: Never  Vaping Use   Vaping status: Never Used  Substance and Sexual Activity   Alcohol  use: Yes    Comment: 1 beer at a time and frequently at social gatherings   Drug use: Not Currently    Comment: See psychiatry note from 10/04/2022   Sexual activity: Yes    Birth control/protection: Condom  Other Topics Concern   Not on file  Social History Narrative   Lives home with mother.  Works at [employer].  Education 10th grade/GED.  No children.  Single.     Social Drivers of Health   Financial Resource Strain: Medium Risk (01/06/2024)   Overall Financial Resource Strain (CARDIA)    Difficulty of Paying Living Expenses: Somewhat hard  Food Insecurity: Food Insecurity Present (01/06/2024)   Hunger Vital Sign    Worried About Running Out of Food in the Last Year: Sometimes true    Ran Out of Food in the Last Year: Sometimes true  Transportation Needs: Unmet Transportation Needs (01/06/2024)   PRAPARE - Administrator, Civil Service (Medical): No    Lack of Transportation (Non-Medical): Yes  Physical Activity: Insufficiently Active (01/06/2024)   Exercise Vital Sign    Days of Exercise per Week: 2 days    Minutes of Exercise per Session: 60 min   Stress: Stress Concern Present (01/06/2024)   [employer] of Occupational Health - Occupational Stress Questionnaire    Feeling of Stress: Very much  Social Connections: Moderately Isolated (01/06/2024)   Social Connection and Isolation Panel    Frequency of Communication with Friends and Family: Twice a week    Frequency of Social Gatherings with Friends and Family: Once a week    Attends Religious Services: 1 to 4 times per year    Active Member of [employer] or Organizations: No    Attends Banker Meetings: Not on file  Marital Status: Never married  Catering manager Violence: Not on file    Outpatient Medications Prior to Visit  Medication Sig Dispense Refill   albuterol  (VENTOLIN  HFA) 108 (90 Base) MCG/ACT inhaler Inhale 1-2 puffs into the lungs every 4 (four) hours as needed for wheezing or shortness of breath. 18 g 3   amLODipine  (NORVASC ) 10 MG tablet Take 1 tablet (10 mg total) by mouth daily. 90 tablet 2   betamethasone  dipropionate 0.05 % cream Apply topically 2 (two) times daily. 45 g 2   busPIRone  (BUSPAR ) 5 MG tablet Take 1 tablet (5 mg total) by mouth 2 (two) times daily. 60 tablet 3   diclofenac  (VOLTAREN ) 75 MG EC tablet Take 75 mg by mouth 2 (two) times daily.     EPINEPHrine  0.3 mg/0.3 mL IJ SOAJ injection Inject 0.3 mg into the muscle as needed for anaphylaxis. 1 each 1   Erenumab -aooe (AIMOVIG ) 70 MG/ML SOAJ Inject 70 mg into the skin every 30 (thirty) days. (Patient taking differently: Inject 70 mg into the skin See admin instructions. Inject once month as needed for migraine reoccurrence.) 1 pen 11   escitalopram  (LEXAPRO ) 10 MG tablet Take 1 tablet (10 mg total) by mouth daily. 30 tablet 3   fluticasone  (FLONASE ) 50 MCG/ACT nasal spray Place 2 sprays into both nostrils daily. 16 g 6   gabapentin  (NEURONTIN ) 300 MG capsule Take 1 capsule (300 mg total) by mouth at bedtime. 30 capsule 1   metroNIDAZOLE  (METROGEL ) 0.75 % vaginal gel Place 1 Applicatorful  vaginally 2 (two) times daily. 70 g 2   montelukast  (SINGULAIR ) 10 MG tablet Take 1 tablet (10 mg total) by mouth at bedtime. (Patient taking differently: Take 10 mg by mouth as needed.) 90 tablet 3   omeprazole  (PRILOSEC) 40 MG capsule Take 1 capsule (40 mg total) by mouth daily. 90 capsule 3   ondansetron  (ZOFRAN ) 4 MG tablet Take 1 tablet (4 mg total) by mouth every 8 (eight) hours as needed for nausea or vomiting. 20 tablet 0   oxyCODONE -acetaminophen  (PERCOCET/ROXICET) 5-325 MG tablet Take 1 tablet by mouth 2 (two) times daily.     SUMAtriptan  (IMITREX ) 6 MG/0.5ML SOLN injection Use one injection at onset of migraine.  May repeat in 2 hrs, if needed.  Max dose: 2 inj/day. This is a 30 day prescription. 5 mL 11   tiZANidine  (ZANAFLEX ) 4 MG tablet Take 1 tablet (4 mg total) by mouth daily. 30 tablet 1   beclomethasone (QVAR  REDIHALER) 80 MCG/ACT inhaler Inhale 2 puffs into the lungs 2 (two) times daily. 1 each 5   No facility-administered medications prior to visit.    Allergies  Allergen Reactions   Diphenhydramine Hcl Anaphylaxis    (Benadryl)   Orange Fruit Hives, Shortness Of Breath, Itching and Other (See Comments)    MAKES IT DIFFICULT TO BREATH   Latex Rash    Review of Systems  Constitutional:  Negative for chills and fever.  HENT:  Negative for congestion, sinus pressure, sinus pain and sore throat.   Eyes:  Negative for pain and discharge.  Respiratory:  Positive for cough, shortness of breath and wheezing.   Cardiovascular:  Negative for chest pain and palpitations.  Gastrointestinal:  Negative for abdominal pain, diarrhea, nausea and vomiting.  Endocrine: Negative for polydipsia and polyuria.  Genitourinary:  Negative for dysuria and hematuria.  Musculoskeletal:  Negative for neck pain and neck stiffness.  Skin:  Negative for rash.  Neurological:  Negative for dizziness and weakness.  Psychiatric/Behavioral:  Negative for agitation and behavioral problems.         Objective:    Physical Exam Vitals reviewed.  Constitutional:      General: She is not in acute distress.    Appearance: She is not diaphoretic.  HENT:     Head: Normocephalic and atraumatic.     Nose: Nose normal.     Mouth/Throat:     Mouth: Mucous membranes are moist.  Eyes:     General: No scleral icterus.    Extraocular Movements: Extraocular movements intact.  Cardiovascular:     Rate and Rhythm: Normal rate and regular rhythm.     Heart sounds: Normal heart sounds. No murmur heard. Pulmonary:     Breath sounds: Wheezing (Bilateral, diffuse) present. No rales.  Musculoskeletal:     Cervical back: Neck supple. No tenderness.     Right lower leg: No edema.     Left lower leg: No edema.  Skin:    General: Skin is warm.     Findings: No rash.  Neurological:     General: No focal deficit present.     Mental Status: She is alert and oriented to person, place, and time.  Psychiatric:        Mood and Affect: Mood normal.        Behavior: Behavior normal.     BP 138/89   Pulse 74   Ht 5' (1.524 m)   Wt 165 lb 12.8 oz (75.2 kg)   SpO2 99%   BMI 32.38 kg/m  Wt Readings from Last 3 Encounters:  02/22/24 165 lb 12.8 oz (75.2 kg)  01/22/24 163 lb 8 oz (74.2 kg)  01/10/24 161 lb (73 kg)        Assessment & Plan:   Problem List Items Addressed This Visit       Respiratory   Asthma with acute exacerbation   Her current symptoms are suggestive of acute bronchitis/asthma exacerbation Depo-Medrol  80 mg IM today Started prednisone  taper Started empiric azithromycin , considering recent history of CAP If persistent symptoms, will obtain imaging Prescribed Airsupra as rescue inhaler instead of albuterol  due to better efficacy She would need to be on a maintenance inhaler, advised to discuss with pulmonology      Relevant Medications   predniSONE  (DELTASONE ) 20 MG tablet   azithromycin  (ZITHROMAX ) 250 MG tablet   promethazine -dextromethorphan  (PROMETHAZINE -DM)  6.25-15 MG/5ML syrup   Albuterol -Budesonide  (AIRSUPRA) 90-80 MCG/ACT AERO     Meds ordered this encounter  Medications   predniSONE  (DELTASONE ) 20 MG tablet    Sig: Take 2 tablets (40 mg total) by mouth daily with breakfast for 2 days, THEN 1 tablet (20 mg total) daily with breakfast for 2 days, THEN 0.5 tablets (10 mg total) daily with breakfast for 2 days.    Dispense:  7 tablet    Refill:  0   azithromycin  (ZITHROMAX ) 250 MG tablet    Sig: Take 2 tablets on day 1, then 1 tablet daily on days 2 through 5    Dispense:  6 tablet    Refill:  0   promethazine -dextromethorphan  (PROMETHAZINE -DM) 6.25-15 MG/5ML syrup    Sig: Take 5 mLs by mouth 4 (four) times daily as needed.    Dispense:  118 mL    Refill:  0   Albuterol -Budesonide  (AIRSUPRA) 90-80 MCG/ACT AERO    Sig: Inhale 2 puffs into the lungs every 6 (six) hours as needed.    Dispense:  33 g    Refill:  1  methylPREDNISolone  acetate (DEPO-MEDROL ) injection 80 mg     Suzzane MARLA Blanch, MD

## 2024-02-22 NOTE — Assessment & Plan Note (Signed)
 Her current symptoms are suggestive of acute bronchitis/asthma exacerbation Depo-Medrol  80 mg IM today Started prednisone  taper Started empiric azithromycin , considering recent history of CAP If persistent symptoms, will obtain imaging Prescribed Airsupra as rescue inhaler instead of albuterol  due to better efficacy She would need to be on a maintenance inhaler, advised to discuss with pulmonology

## 2024-02-22 NOTE — Patient Instructions (Addendum)
 Please start taking Azithromycin  and Prednisone  as prescribed.  Please take Promethazine -DM syrup as needed for cough.  Please use AirSupra as needed for shortness of breath or wheezing. Please rinse your mouth after each use.

## 2024-02-25 ENCOUNTER — Other Ambulatory Visit (HOSPITAL_COMMUNITY): Payer: Self-pay

## 2024-02-25 ENCOUNTER — Ambulatory Visit (HOSPITAL_COMMUNITY)
Admission: RE | Admit: 2024-02-25 | Discharge: 2024-02-25 | Disposition: A | Discharge status: Home / Self Care | Source: Ambulatory Visit | Attending: Family Medicine | Admitting: Family Medicine

## 2024-02-25 ENCOUNTER — Telehealth: Payer: Self-pay | Admitting: Pharmacy Technician

## 2024-02-25 DIAGNOSIS — Z1231 Encounter for screening mammogram for malignant neoplasm of breast: Secondary | ICD-10-CM | POA: Diagnosis present

## 2024-02-25 NOTE — Telephone Encounter (Signed)
 Pharmacy Patient Advocate Encounter  Received notification from [employer] [place]  that Prior Authorization for Airsupra 90-80MCG/ACT aerosol has been APPROVED from 01/25/2024 to 02/23/2025. Ran test claim, Copay is $0.00. This test claim was processed through [facility]- copay amounts may vary at other pharmacies due to pharmacy/plan contracts, or as the patient moves through the different stages of their insurance plan.   PA #/Case ID/Reference #: [id number]

## 2024-02-28 ENCOUNTER — Ambulatory Visit: Payer: Self-pay

## 2024-03-03 ENCOUNTER — Encounter: Payer: Self-pay | Admitting: Radiology

## 2024-03-14 ENCOUNTER — Encounter: Payer: Self-pay | Admitting: Orthopedic Surgery

## 2024-03-14 ENCOUNTER — Ambulatory Visit: Admitting: Orthopedic Surgery

## 2024-03-14 DIAGNOSIS — M25561 Pain in right knee: Secondary | ICD-10-CM | POA: Diagnosis not present

## 2024-03-14 DIAGNOSIS — M47816 Spondylosis without myelopathy or radiculopathy, lumbar region: Secondary | ICD-10-CM

## 2024-03-14 DIAGNOSIS — G8929 Other chronic pain: Secondary | ICD-10-CM

## 2024-03-14 MED ORDER — METHYLPREDNISOLONE ACETATE 40 MG/ML IJ SUSP
40.0000 mg | Freq: Once | INTRAMUSCULAR | Status: AC
  Administered 2024-03-14: 40 mg via INTRA_ARTICULAR

## 2024-03-14 NOTE — Progress Notes (Signed)
   Procedure note for injection   Chief Complaint  Patient presents with   Knee Pain    Right wants injection     Encounter Diagnoses  Name Primary?   Chronic pain of right knee Yes   Facet arthritis of lumbar region       The patient request injection right knee joint said the last 1 in September helped  She has subsequently had an MRI which showed no damage inside the knee I discussed this with her.  Too many injections will cause cartilage damage and chondrocyte death.  However, she says these helped her and she wants to have it done    The patient has consented for injection of the right knee joint  Medication: Depo-Medrol  40 mg and lidocaine  1%  Time out completed: Yes  The site of injection was cleaned with alcohol  and ethyl chloride.  The injection was given without any complications appropriate precautions were given.

## 2024-03-17 ENCOUNTER — Encounter: Payer: Self-pay | Admitting: Orthopedic Surgery

## 2024-03-21 NOTE — Progress Notes (Signed)
 JO-ANNE KLUTH                                          MRN: [id number]   03/21/2024   The [facility] Quality Team Specialist reviewed this patient medical record for the purposes of chart review for care gap closure. The following were reviewed: abstraction for care gap closure-controlling blood pressure.    [facility] Quality Team

## 2024-03-25 ENCOUNTER — Encounter: Payer: Self-pay | Admitting: Gastroenterology

## 2024-04-02 ENCOUNTER — Ambulatory Visit: Admitting: Physician Assistant

## 2024-04-08 MED ORDER — CYCLOBENZAPRINE HCL 10 MG PO TABS: 10.0000 mg | ORAL_TABLET | Freq: Two times a day (BID) | ORAL | 1 refills | Status: AC | PRN

## 2024-04-08 NOTE — Addendum Note (Signed)
 Addended byBETHA JENEAN GREIG LELON on: 04/08/2024 11:10 AM   Modules accepted: Orders

## 2024-04-11 ENCOUNTER — Ambulatory Visit: Admitting: Family Medicine

## 2024-04-11 ENCOUNTER — Ambulatory Visit (HOSPITAL_COMMUNITY)
Admission: RE | Admit: 2024-04-11 | Discharge: 2024-04-11 | Disposition: A | Source: Ambulatory Visit | Attending: Family Medicine | Admitting: Family Medicine

## 2024-04-11 ENCOUNTER — Encounter: Payer: Self-pay | Admitting: Family Medicine

## 2024-04-11 VITALS — BP 128/84 | HR 98 | Resp 18 | Ht 60.0 in | Wt 164.0 lb

## 2024-04-11 DIAGNOSIS — J189 Pneumonia, unspecified organism: Secondary | ICD-10-CM

## 2024-04-11 DIAGNOSIS — J13 Pneumonia due to Streptococcus pneumoniae: Secondary | ICD-10-CM | POA: Diagnosis not present

## 2024-04-11 DIAGNOSIS — R110 Nausea: Secondary | ICD-10-CM | POA: Diagnosis not present

## 2024-04-11 DIAGNOSIS — I10 Essential (primary) hypertension: Secondary | ICD-10-CM

## 2024-04-11 DIAGNOSIS — N951 Menopausal and female climacteric states: Secondary | ICD-10-CM | POA: Diagnosis not present

## 2024-04-11 DIAGNOSIS — R918 Other nonspecific abnormal finding of lung field: Secondary | ICD-10-CM | POA: Diagnosis not present

## 2024-04-11 DIAGNOSIS — B9689 Other specified bacterial agents as the cause of diseases classified elsewhere: Secondary | ICD-10-CM | POA: Diagnosis not present

## 2024-04-11 DIAGNOSIS — R634 Abnormal weight loss: Secondary | ICD-10-CM

## 2024-04-11 DIAGNOSIS — N907 Vulvar cyst: Secondary | ICD-10-CM | POA: Diagnosis not present

## 2024-04-11 DIAGNOSIS — Z23 Encounter for immunization: Secondary | ICD-10-CM

## 2024-04-11 MED ORDER — MUPIROCIN 2 % EX OINT: 1.0000 | TOPICAL_OINTMENT | Freq: Every day | CUTANEOUS | 0 refills | Status: AC

## 2024-04-11 MED ORDER — VENLAFAXINE HCL ER 37.5 MG PO CP24: 37.5000 mg | ORAL_CAPSULE | Freq: Every day | ORAL | 4 refills | Status: AC

## 2024-04-11 MED ORDER — METRONIDAZOLE 0.75 % VA GEL: 1.0000 | Freq: Two times a day (BID) | VAGINAL | 2 refills | Status: DC

## 2024-04-11 NOTE — Patient Instructions (Addendum)
 Annual exam with pap  in 4.5 months   Nurse visit in 1 week for pneumonia vaccine , then in 2 weeks for Hep B#1   cXR today  Bactroban prescribed for nodule if persists or worsens you need to see gynecology  Flu vaccine today  Effexor  is prescribed for hot flashes and anxiety  Merry Christmas!!  Thanks for choosing [facility], we consider it a privelige to serve you.

## 2024-04-12 DIAGNOSIS — N907 Vulvar cyst: Secondary | ICD-10-CM | POA: Insufficient documentation

## 2024-04-12 DIAGNOSIS — Z23 Encounter for immunization: Secondary | ICD-10-CM | POA: Insufficient documentation

## 2024-04-12 DIAGNOSIS — B9689 Other specified bacterial agents as the cause of diseases classified elsewhere: Secondary | ICD-10-CM | POA: Insufficient documentation

## 2024-04-12 DIAGNOSIS — N951 Menopausal and female climacteric states: Secondary | ICD-10-CM | POA: Insufficient documentation

## 2024-04-12 DIAGNOSIS — J13 Pneumonia due to Streptococcus pneumoniae: Secondary | ICD-10-CM | POA: Insufficient documentation

## 2024-04-12 NOTE — Assessment & Plan Note (Signed)
 Ongoing evaluation in place, symptoms unchanged

## 2024-04-12 NOTE — Progress Notes (Signed)
° °  Rebecca Boyd     MRN: [id number]      DOB: March 22, 1976  Chief Complaint  Patient presents with   Hypertension    4 month follow up    Cyst    Pt complains of knot on inside of left thigh. Pt not sure when it appeared. Not painful     HPI Rebecca Boyd is here for follow up and re-evaluation of chronic medical conditions, medication management and review of any available recent lab and radiology data.  Preventive health is updated, specifically  Cancer screening and Immunization.   Dx radiologically with LLL pneumonia in 12/2023 needs f/u CXR The PT denies any adverse reactions to current medications since the last visit.  C/O painless knot on left labia x 2 to 3 weeks, no vaginal d/c  C/O hot flashes   ROS Denies recent fever or chills. Denies sinus pressure, nasal congestion, ear pain or sore throat. Denies chest congestion, productive cough or wheezing. Denies chest pains, palpitations and leg swelling Denies abdominal pain, nausea, vomiting,diarrhea or constipation.   Denies dysuria, frequency, hesitancy or incontinence. Denies joint pain, swelling and limitation in mobility. Denies headaches, seizures, numbness, or tingling. Denies depression,c/o  anxiety and  insomnia.  PE  BP 128/84   Pulse 98   Resp 18   Ht 5' (1.524 m)   Wt 164 lb 0.6 oz (74.4 kg)   SpO2 98%   BMI 32.04 kg/m   Patient alert and oriented and in no cardiopulmonary distress.  HEENT: No facial asymmetry, EOMI,     Neck supple .  Chest: Clear to auscultation bilaterally.  CVS: S1, S2 no murmurs, no S3.Regular rate.  ABD: Soft non tender.   Ext: No edema  MS: Adequate ROM spine, shoulders, hips and knees.  Skin: nodule, pea sized on left labia majora.  Psych: Good eye contact, normal affect. Memory intact not anxious or depressed appearing.  CNS: CN 2-12 intact, power,  normal throughout.no focal deficits noted.   Assessment & Plan  Essential hypertension Controlled, no change in  medication DASH diet and commitment to daily physical activity for a minimum of 30 minutes discussed and encouraged, as a part of hypertension management. The importance of attaining a healthy weight is also discussed.     04/11/2024    2:02 PM 04/11/2024    2:01 PM 04/11/2024    1:34 PM 02/22/2024    8:09 AM 01/22/2024    1:31 PM 01/10/2024    3:47 PM 01/08/2024   12:31 PM  BP/Weight  Systolic BP 128 132 141 138 132 146 125  Diastolic BP 84 88 89 89 85 93 86  Wt. (Lbs)   164.04 165.8 163.5 161   BMI   32.04 kg/m2 32.38 kg/m2 31.93 kg/m2 31.44 kg/m2        Nausea Persistent severe with decreased ability to eat regularly  Weight loss Ongoing evaluation in place, symptoms unchanged  Community acquired pneumonia of left lower lobe of lung Asymptomatic, follow up CXR past due to establish clearance, pt to have this today  Bacterial vaginosis Metrogel  prescribed   Immunization due After obtaining informed consent, the influenza  vaccine is  administered , with no adverse effect noted at the time of administration.   Menopausal syndrome (hot flashes) Start effexor  376.5 mg daily  Epidermoid cyst of labia majora Topical bactroban , if persists or worsens needs Gyne eval

## 2024-04-12 NOTE — Assessment & Plan Note (Signed)
 Persistent severe with decreased ability to eat regularly

## 2024-04-12 NOTE — Assessment & Plan Note (Signed)
 Controlled, no change in medication DASH diet and commitment to daily physical activity for a minimum of 30 minutes discussed and encouraged, as a part of hypertension management. The importance of attaining a healthy weight is also discussed.     04/11/2024    2:02 PM 04/11/2024    2:01 PM 04/11/2024    1:34 PM 02/22/2024    8:09 AM 01/22/2024    1:31 PM 01/10/2024    3:47 PM 01/08/2024   12:31 PM  BP/Weight  Systolic BP 128 132 141 138 132 146 125  Diastolic BP 84 88 89 89 85 93 86  Wt. (Lbs)   164.04 165.8 163.5 161   BMI   32.04 kg/m2 32.38 kg/m2 31.93 kg/m2 31.44 kg/m2

## 2024-04-12 NOTE — Assessment & Plan Note (Signed)
Metrogel prescribed.

## 2024-04-12 NOTE — Assessment & Plan Note (Signed)
 After obtaining informed consent, the influenza vaccine is  administered , with no adverse effect noted at the time of administration.

## 2024-04-12 NOTE — Assessment & Plan Note (Signed)
 Start effexor  376.5 mg daily

## 2024-04-12 NOTE — Assessment & Plan Note (Signed)
 Asymptomatic, follow up CXR past due to establish clearance, pt to have this today

## 2024-04-12 NOTE — Assessment & Plan Note (Signed)
 Topical bactroban , if persists or worsens needs Gyne eval

## 2024-04-18 ENCOUNTER — Ambulatory Visit: Payer: Self-pay

## 2024-04-18 DIAGNOSIS — Z23 Encounter for immunization: Secondary | ICD-10-CM

## 2024-04-18 NOTE — Progress Notes (Signed)
 Patient is in office today for a nurse visit for Immunization. Patient Injection was given in the  Left deltoid. Patient tolerated injection well.

## 2024-04-21 ENCOUNTER — Ambulatory Visit: Payer: Self-pay | Admitting: Family Medicine

## 2024-04-28 ENCOUNTER — Other Ambulatory Visit: Payer: Self-pay | Admitting: Gastroenterology

## 2024-04-28 MED ORDER — ONDANSETRON HCL 4 MG PO TABS: 4.0000 mg | ORAL_TABLET | Freq: Three times a day (TID) | ORAL | 2 refills | Status: AC | PRN

## 2024-04-29 ENCOUNTER — Other Ambulatory Visit: Payer: Self-pay | Admitting: *Deleted

## 2024-04-29 DIAGNOSIS — R634 Abnormal weight loss: Secondary | ICD-10-CM

## 2024-04-29 DIAGNOSIS — R110 Nausea: Secondary | ICD-10-CM

## 2024-04-29 DIAGNOSIS — R1013 Epigastric pain: Secondary | ICD-10-CM

## 2024-04-30 NOTE — Telephone Encounter (Signed)
 The form is on my desk for her to sign for the trio- smart breath test.

## 2024-05-02 ENCOUNTER — Ambulatory Visit: Payer: Self-pay

## 2024-05-05 ENCOUNTER — Encounter: Payer: Self-pay | Admitting: Family Medicine

## 2024-05-05 ENCOUNTER — Encounter (HOSPITAL_COMMUNITY)
Admission: RE | Admit: 2024-05-05 | Discharge: 2024-05-05 | Disposition: A | Discharge status: Home / Self Care | Source: Ambulatory Visit | Attending: Gastroenterology | Admitting: Gastroenterology

## 2024-05-05 DIAGNOSIS — R110 Nausea: Secondary | ICD-10-CM | POA: Diagnosis present

## 2024-05-05 DIAGNOSIS — N907 Vulvar cyst: Secondary | ICD-10-CM

## 2024-05-05 DIAGNOSIS — R634 Abnormal weight loss: Secondary | ICD-10-CM | POA: Insufficient documentation

## 2024-05-05 DIAGNOSIS — R1013 Epigastric pain: Secondary | ICD-10-CM | POA: Diagnosis present

## 2024-05-05 MED ORDER — TECHNETIUM TC 99M SULFUR COLLOID
2.1000 | Freq: Once | INTRAVENOUS | Status: AC | PRN
  Administered 2024-05-05: 2.1 via INTRAVENOUS

## 2024-05-08 ENCOUNTER — Ambulatory Visit: Payer: Self-pay | Admitting: Gastroenterology

## 2024-05-08 ENCOUNTER — Telehealth: Payer: Self-pay | Admitting: Orthopedic Surgery

## 2024-05-08 NOTE — Telephone Encounter (Signed)
 Patients intermittent leave ended 04/30/24. Please advise if okay for new intermittent leave form. Thank you!

## 2024-05-08 NOTE — Telephone Encounter (Signed)
 Completed per Dr. Margrette. Faxed.

## 2024-05-13 ENCOUNTER — Encounter: Payer: Self-pay | Admitting: *Deleted

## 2024-05-16 ENCOUNTER — Other Ambulatory Visit (HOSPITAL_COMMUNITY)
Admission: RE | Admit: 2024-05-16 | Discharge: 2024-05-16 | Disposition: A | Source: Ambulatory Visit | Attending: Adult Health | Admitting: Adult Health

## 2024-05-16 ENCOUNTER — Encounter: Payer: Self-pay | Admitting: Adult Health

## 2024-05-16 ENCOUNTER — Other Ambulatory Visit (HOSPITAL_COMMUNITY): Admission: RE | Admit: 2024-05-16 | Discharge: 2024-05-16 | Disposition: A | Source: Ambulatory Visit

## 2024-05-16 ENCOUNTER — Ambulatory Visit: Admitting: Adult Health

## 2024-05-16 VITALS — BP 130/87 | HR 79 | Ht 60.0 in | Wt 159.0 lb

## 2024-05-16 DIAGNOSIS — Z1331 Encounter for screening for depression: Secondary | ICD-10-CM | POA: Diagnosis not present

## 2024-05-16 DIAGNOSIS — Z124 Encounter for screening for malignant neoplasm of cervix: Secondary | ICD-10-CM | POA: Insufficient documentation

## 2024-05-16 DIAGNOSIS — L918 Other hypertrophic disorders of the skin: Secondary | ICD-10-CM

## 2024-05-16 DIAGNOSIS — N900 Mild vulvar dysplasia: Secondary | ICD-10-CM | POA: Diagnosis not present

## 2024-05-16 DIAGNOSIS — N9089 Other specified noninflammatory disorders of vulva and perineum: Secondary | ICD-10-CM | POA: Diagnosis present

## 2024-05-16 DIAGNOSIS — L720 Epidermal cyst: Secondary | ICD-10-CM

## 2024-05-16 NOTE — Addendum Note (Signed)
 Addended by: NEYSA CLARITA RAMAN on: 05/16/2024 10:58 AM   Modules accepted: Orders

## 2024-05-16 NOTE — Progress Notes (Signed)
 " Subjective:     Patient ID: [name], female   DOB: 1975/06/11, 49 y.o.   MRN: [id number]  HPI Maciah is a 49 year old black female, single, G2P0020, in complaining of ? Cyst left labia and she needs a pap.  PCP is Dr Antonetta  Review of Systems ?cyst left labia noticed a week ago No periods, had ablation Does have some hot flashes Reviewed past medical,surgical, social and family history. Reviewed medications and allergies.     Objective:   Physical Exam BP 130/87 (BP Location: Left Arm, Patient Position: Sitting, Cuff Size: Normal)   Pulse 79   Ht 5' (1.524 m)   Wt 159 lb (72.1 kg)   BMI 31.05 kg/m     Skin warm and dry.Pelvic: external genitalia is normal in appearance, has 3 mm skin tag left labia, and she wants removed and had pea sized epidermal cyst near clitoral area, vagina: white discharge with odor,urethra has no lesions or masses noted, cervix:smooth,pap with GC/CHL and HR HPV genotyping performed, uterus: normal size, shape and contour, non tender, no masses felt, adnexa: no masses or tenderness noted. Bladder is non tender and no masses felt. AA is 1 Fall risk is low    05/16/2024    9:53 AM 04/11/2024    1:35 PM 02/22/2024    8:19 AM  Depression screen PHQ 2/9  Decreased Interest 1 1 0  Down, Depressed, Hopeless 1 0 0  PHQ - 2 Score 2 1 0  Altered sleeping 0 0 0  Tired, decreased energy 3 0 0  Change in appetite 2 0 0  Feeling bad or failure about yourself  1 0 0  Trouble concentrating 0 0 0  Moving slowly or fidgety/restless 0 0 0  Suicidal thoughts 0 0 0  PHQ-9 Score 8 1 0   Difficult doing work/chores  Not difficult at all Not difficult at all     Data saved with a previous flowsheet row definition     She is on meds and sees PCp    05/16/2024    9:42 AM 04/11/2024    1:35 PM 02/22/2024    8:19 AM 12/06/2023    1:03 PM  GAD 7 : Generalized Anxiety Score  Nervous, Anxious, on Edge 3 2 0 3  Control/stop worrying 1 2 0 3  Worry too much -  different things 3 1 0 3  Trouble relaxing 1 0 0 1  Restless 1 0 0 1  Easily annoyed or irritable 3 1 0 3  Afraid - awful might happen 0 1 0 0  Total GAD 7 Score 12 7 0 14  Anxiety Difficulty  Somewhat difficult Not difficult at all Somewhat difficult    Upstream - 05/16/24 0948       Pregnancy Intention Screening   Does the patient want to become pregnant in the next year? No    Does the patient's partner want to become pregnant in the next year? No    Would the patient like to discuss contraceptive options today? No      Contraception Wrap Up   Current Method Female Sterilization    End Method Female Sterilization    Contraception Counseling Provided No         Examination chaperoned by Clarita Salt LPN   SKIN TAG REMOVAL: Consent signed and time out called. Left labia cleansed with betadine and skin tag injected with 1 cc 2% lidocaine  and forceps applied to base of skin tag,  then removed, and used iris scissors to excision skin tag, place in formalin pot. Used silver nitrated stick to stop any bleeding, and folded 4 x 4 for pressure.     Assessment:     1. Routine Papanicolaou smear Pap sent Pap in 3 years if normal Physical with PCP - Cytology - PAP( [place])  2. Skin tag of vulva (Primary) Skin tag removed and sent to pathology  3. Epidermal cyst Leave alone unless starts to get bigger     Plan:     Follow up in 3 years for pap or as needed     "

## 2024-05-19 ENCOUNTER — Ambulatory Visit: Payer: Self-pay | Admitting: Adult Health

## 2024-05-19 DIAGNOSIS — N9089 Other specified noninflammatory disorders of vulva and perineum: Secondary | ICD-10-CM

## 2024-05-19 LAB — SURGICAL PATHOLOGY

## 2024-05-20 LAB — CYTOLOGY - PAP
Chlamydia: NEGATIVE
Comment: NEGATIVE
Comment: NEGATIVE
Comment: NORMAL
Diagnosis: NEGATIVE
High risk HPV: NEGATIVE
Neisseria Gonorrhea: NEGATIVE

## 2024-05-20 MED ORDER — METRONIDAZOLE 500 MG PO TABS: 500.0000 mg | ORAL_TABLET | Freq: Two times a day (BID) | ORAL | 0 refills | Status: AC

## 2024-06-03 ENCOUNTER — Other Ambulatory Visit: Payer: Self-pay | Admitting: Orthopedic Surgery

## 2024-06-03 ENCOUNTER — Encounter: Payer: Self-pay | Admitting: Orthopedic Surgery

## 2024-06-03 DIAGNOSIS — M5116 Intervertebral disc disorders with radiculopathy, lumbar region: Secondary | ICD-10-CM

## 2024-06-03 DIAGNOSIS — M5432 Sciatica, left side: Secondary | ICD-10-CM

## 2024-06-05 ENCOUNTER — Encounter: Payer: Self-pay | Admitting: Orthopedic Surgery

## 2024-06-10 ENCOUNTER — Other Ambulatory Visit

## 2024-06-13 ENCOUNTER — Ambulatory Visit: Admitting: Orthopedic Surgery

## 2024-06-16 ENCOUNTER — Ambulatory Visit: Admitting: Orthopedic Surgery

## 2024-08-22 ENCOUNTER — Ambulatory Visit: Payer: Self-pay | Admitting: Family Medicine

## 2024-12-01 ENCOUNTER — Ambulatory Visit (HOSPITAL_COMMUNITY): Admitting: Registered Nurse
